# Patient Record
Sex: Female | Born: 1965 | Race: Black or African American | Hispanic: No | Marital: Married | Smoking: Current every day smoker
Health system: Southern US, Community
[De-identification: ages and names within clinical notes are randomized; demographics above are authoritative.]

## PROBLEM LIST (undated history)

## (undated) DIAGNOSIS — M545 Low back pain, unspecified: Secondary | ICD-10-CM

## (undated) DIAGNOSIS — F32A Depression, unspecified: Secondary | ICD-10-CM

## (undated) DIAGNOSIS — K219 Gastro-esophageal reflux disease without esophagitis: Secondary | ICD-10-CM

## (undated) DIAGNOSIS — M461 Sacroiliitis, not elsewhere classified: Secondary | ICD-10-CM

## (undated) DIAGNOSIS — E785 Hyperlipidemia, unspecified: Secondary | ICD-10-CM

## (undated) DIAGNOSIS — G5600 Carpal tunnel syndrome, unspecified upper limb: Secondary | ICD-10-CM

## (undated) DIAGNOSIS — F329 Major depressive disorder, single episode, unspecified: Secondary | ICD-10-CM

## (undated) DIAGNOSIS — J302 Other seasonal allergic rhinitis: Secondary | ICD-10-CM

## (undated) DIAGNOSIS — M5430 Sciatica, unspecified side: Secondary | ICD-10-CM

## (undated) DIAGNOSIS — T7840XA Allergy, unspecified, initial encounter: Secondary | ICD-10-CM

## (undated) DIAGNOSIS — K5792 Diverticulitis of intestine, part unspecified, without perforation or abscess without bleeding: Secondary | ICD-10-CM

## (undated) DIAGNOSIS — J449 Chronic obstructive pulmonary disease, unspecified: Secondary | ICD-10-CM

## (undated) DIAGNOSIS — M542 Cervicalgia: Secondary | ICD-10-CM

## (undated) HISTORY — PX: OTHER SURGICAL HISTORY: SHX169

## (undated) HISTORY — PX: BREAST EXCISIONAL BIOPSY: SUR124

## (undated) HISTORY — DX: Chronic obstructive pulmonary disease, unspecified: J44.9

## (undated) HISTORY — PX: PARTIAL HYSTERECTOMY: SHX80

## (undated) HISTORY — PX: ANKLE SURGERY: SHX546

## (undated) HISTORY — DX: Carpal tunnel syndrome, unspecified upper limb: G56.00

## (undated) HISTORY — DX: Major depressive disorder, single episode, unspecified: F32.9

## (undated) HISTORY — DX: Hyperlipidemia, unspecified: E78.5

## (undated) HISTORY — PX: URETHRAL DIVERTICULUM REPAIR: SHX5148

## (undated) HISTORY — PX: ABDOMINAL SURGERY: SHX537

## (undated) HISTORY — DX: Cervicalgia: M54.2

## (undated) HISTORY — DX: Gastro-esophageal reflux disease without esophagitis: K21.9

## (undated) HISTORY — DX: Allergy, unspecified, initial encounter: T78.40XA

## (undated) HISTORY — DX: Depression, unspecified: F32.A

---

## 2007-03-01 ENCOUNTER — Inpatient Hospital Stay (HOSPITAL_COMMUNITY): Admission: AD | Admit: 2007-03-01 | Discharge: 2007-03-01 | Payer: Self-pay | Admitting: Gynecology

## 2007-10-15 ENCOUNTER — Encounter: Admission: RE | Admit: 2007-10-15 | Discharge: 2007-10-15 | Payer: Self-pay | Admitting: Internal Medicine

## 2009-09-10 ENCOUNTER — Emergency Department (HOSPITAL_COMMUNITY)
Admission: EM | Admit: 2009-09-10 | Discharge: 2009-09-10 | Payer: Self-pay | Source: Home / Self Care | Admitting: Emergency Medicine

## 2009-12-18 ENCOUNTER — Emergency Department (HOSPITAL_COMMUNITY)
Admission: EM | Admit: 2009-12-18 | Discharge: 2009-12-18 | Payer: Self-pay | Source: Home / Self Care | Admitting: Family Medicine

## 2010-04-03 LAB — URINALYSIS, ROUTINE W REFLEX MICROSCOPIC
Bilirubin Urine: NEGATIVE
Hgb urine dipstick: NEGATIVE
Nitrite: NEGATIVE
Protein, ur: NEGATIVE mg/dL
Urobilinogen, UA: 0.2 mg/dL (ref 0.0–1.0)

## 2010-04-03 LAB — URINE MICROSCOPIC-ADD ON

## 2010-04-03 LAB — POCT PREGNANCY, URINE: Preg Test, Ur: NEGATIVE

## 2010-09-25 ENCOUNTER — Inpatient Hospital Stay (INDEPENDENT_AMBULATORY_CARE_PROVIDER_SITE_OTHER)
Admission: RE | Admit: 2010-09-25 | Discharge: 2010-09-25 | Disposition: A | Discharge status: Home / Self Care | Payer: Self-pay | Source: Ambulatory Visit | Attending: Family Medicine | Admitting: Family Medicine

## 2010-09-25 DIAGNOSIS — M62838 Other muscle spasm: Secondary | ICD-10-CM

## 2010-09-25 DIAGNOSIS — M461 Sacroiliitis, not elsewhere classified: Secondary | ICD-10-CM

## 2010-10-09 LAB — URINALYSIS, ROUTINE W REFLEX MICROSCOPIC
Leukocytes, UA: NEGATIVE
Nitrite: NEGATIVE
Specific Gravity, Urine: >1.03 — ABNORMAL HIGH
Urobilinogen, UA: 0.2

## 2010-10-09 LAB — URINE MICROSCOPIC-ADD ON

## 2010-10-09 LAB — CBC
HCT: 43.6
Hemoglobin: 15
MCV: 95.3
Platelets: 241
RDW: 14.5
WBC: 10.2

## 2010-10-09 LAB — POCT PREGNANCY, URINE: Preg Test, Ur: NEGATIVE

## 2010-10-09 LAB — URINE CULTURE

## 2011-01-21 ENCOUNTER — Emergency Department (HOSPITAL_COMMUNITY)
Admission: EM | Admit: 2011-01-21 | Discharge: 2011-01-21 | Disposition: A | Discharge status: Home / Self Care | Payer: Self-pay | Attending: Emergency Medicine | Admitting: Emergency Medicine

## 2011-01-21 DIAGNOSIS — J069 Acute upper respiratory infection, unspecified: Secondary | ICD-10-CM | POA: Insufficient documentation

## 2011-01-21 DIAGNOSIS — J3489 Other specified disorders of nose and nasal sinuses: Secondary | ICD-10-CM | POA: Insufficient documentation

## 2011-01-21 DIAGNOSIS — F17200 Nicotine dependence, unspecified, uncomplicated: Secondary | ICD-10-CM | POA: Insufficient documentation

## 2011-01-21 DIAGNOSIS — R05 Cough: Secondary | ICD-10-CM | POA: Insufficient documentation

## 2011-01-21 DIAGNOSIS — R059 Cough, unspecified: Secondary | ICD-10-CM | POA: Insufficient documentation

## 2011-01-21 DIAGNOSIS — J029 Acute pharyngitis, unspecified: Secondary | ICD-10-CM | POA: Insufficient documentation

## 2011-01-21 NOTE — ED Notes (Signed)
Pt. Has a cough and reports that her lymph nodes are painful in her neck.

## 2011-01-21 NOTE — ED Notes (Signed)
Vital signs stable. 

## 2011-01-21 NOTE — ED Notes (Signed)
Patient denies pain and is resting comfortably.  

## 2011-01-21 NOTE — ED Provider Notes (Signed)
History     CSN: [id number]  Arrival date & time 01/21/11  1133   First MD Initiated Contact with Patient 01/21/11 1228      Chief Complaint  Patient presents with  . URI    (Consider location/radiation/quality/duration/timing/severity/associated sxs/prior treatment) HPI Patient presents with complaint of sore throat. She states that she has had sore throat for 4-5 days. No difficulty swallowing or breathing. She does complain of tender lymph nodes in her anterior neck. Pain is symmetric. She has had mild cough which is nonproductive. She has also had some rhinorrhea. She's had no fever or chills. She's been drinking fluids well and has tried Robitussin and Mucinex for her symptoms without relief. There no other alleviating or modifying factors. There no associated systemic symptoms.  History reviewed. No pertinent past medical history.  Past Surgical History  Procedure Date  . Ankle surgery     lt.  Marland Kitchen Uretherl     uretrral diverticultis  . Abdominal hysterectomy     History reviewed. No pertinent family history.  History  Substance Use Topics  . Smoking status: Current Everyday Smoker  . Smokeless tobacco: Never Used  . Alcohol Use: No    OB History    Grav Para Term Preterm Abortions TAB SAB Ect Mult Living                  Review of Systems ROS reviewed and otherwise negative except for mentioned in HPI  Allergies  Penicillins  Home Medications   Current Outpatient Rx  Name Route Sig Dispense Refill  . ALLERGY DECONGESTANT PO Oral Take 1 tablet by mouth daily.      . IBUPROFEN 200 MG PO TABS Oral Take 200 mg by mouth every 6 (six) hours as needed. For pain with scar tissue     . ACIDOPHILUS PO Oral Take 1 tablet by mouth daily.      . ADULT MULTIVITAMIN W/MINERALS CH Oral Take 1 tablet by mouth daily.      Marland Kitchen VITAMIN B-6 PO Oral Take 1 tablet by mouth daily.        BP 107/65  Pulse 59  Temp(Src) 97.7 F (36.5 C) (Oral)  Resp 16  SpO2 100% Vitals  reviewed Physical Exam Physical Examination: General appearance - alert, well appearing, and in no distress Mental status - alert, oriented to person, place, and time Eyes - pupils equal and reactive, extraocular eye movements intact Mouth - MMM, OP with mild erythema, no exudate, palate symmetric, uvula midline Neck - supple, no significant adenopathy, shotty anterior cervical LAD Chest - clear to auscultation, no wheezes, rales or rhonchi, symmetric air entry Heart - normal rate, regular rhythm, normal S1, S2, no murmurs, rubs, clicks or gallops Musculoskeletal - no joint tenderness, deformity or swelling Extremities - peripheral pulses normal, no pedal edema, no clubbing or cyanosis Skin - normal coloration and turgor, no rashes  ED Course  Procedures (including critical care time)  Rapid strep negative   Labs Reviewed  RAPID STREP SCREEN   No results found.   1. Pharyngitis   2. Upper respiratory infection       MDM  Patient presenting with sore throat runny nose and tender swollen glands in her neck. She is nontoxic and well-hydrated appearing. Her rapid strep testing was negative in the ED her symptoms likely represent a viral upper respiratory infection. She is to continue symptomatic treatment home and we have discussed the importance of hydration. She was discharged with strict return  precautions and is agreeable with the plan for discharge.        Ethelda Chick, MD 01/21/11 1330

## 2011-06-17 ENCOUNTER — Emergency Department (INDEPENDENT_AMBULATORY_CARE_PROVIDER_SITE_OTHER)
Admission: EM | Admit: 2011-06-17 | Discharge: 2011-06-17 | Disposition: A | Discharge status: Home / Self Care | Payer: Self-pay | Source: Home / Self Care | Attending: Emergency Medicine | Admitting: Emergency Medicine

## 2011-06-17 ENCOUNTER — Encounter (HOSPITAL_COMMUNITY): Payer: Self-pay | Admitting: Emergency Medicine

## 2011-06-17 DIAGNOSIS — M461 Sacroiliitis, not elsewhere classified: Secondary | ICD-10-CM

## 2011-06-17 HISTORY — DX: Other seasonal allergic rhinitis: J30.2

## 2011-06-17 HISTORY — DX: Low back pain: M54.5

## 2011-06-17 HISTORY — DX: Sacroiliitis, not elsewhere classified: M46.1

## 2011-06-17 HISTORY — DX: Low back pain, unspecified: M54.50

## 2011-06-17 HISTORY — DX: Diverticulitis of intestine, part unspecified, without perforation or abscess without bleeding: K57.92

## 2011-06-17 MED ORDER — PREDNISONE 20 MG PO TABS: ORAL_TABLET | ORAL | Status: AC

## 2011-06-17 MED ORDER — MELOXICAM 15 MG PO TABS: 15.0000 mg | ORAL_TABLET | Freq: Every day | ORAL | Status: DC

## 2011-06-17 MED ORDER — HYDROCODONE-ACETAMINOPHEN 5-325 MG PO TABS: 2.0000 | ORAL_TABLET | ORAL | Status: AC | PRN

## 2011-06-17 NOTE — Discharge Instructions (Signed)
Take the medication as written. Take 1 gram of tylenol  up to 4 times a day as needed for pain and fever. This with the meloxicam is an effective combination for pain. Take the hydrocodone/norco only for severe pain. Do not take the tylenol and hydrocodone/norco as they both have tylenol in them and too much can hurt your liver. Do not exceed 4 grams of tylenol a day from all sources. Return if you get worse, have a  fever >100.4, or for any concerns.   Go to www.goodrx.com to look up your medications. This will give you a list of where you can find your prescriptions at the most affordable prices.  Go to www.goodrx.com to look up your medications. This will give you a list of where you can find your prescriptions at the most affordable prices.   Call [facility]  [phone]  If you have no primary doctor, here are some resources that may be helpful:  Medicaid-accepting [facility] Providers:   - [facility]- [address]      [phone]      Mon-Fri 9am-7pm, Sat 9am-1pm   - [facility]- [address] [place], [place]      [phone]   - [facility]- [address], [address] [place]      [phone]   [facility]- [address]      [phone]   - Renaye Rakers- [address] [place], [address]      [phone]      Only accepts [place] patients       after they have her name applied to their card   Self Pay (no insurance) in [place]:   - Sickle Cell Patients: Dr Willey Blade, [facility]      [address] [place]      [phone]   - [phone]   - Physician Referral Service- [phone]   - [facility]- [address] [place]      [phone]   [facility] [place]- [address]   - [facility]- see information above      (Speak to [employer] if you do not have insurance)   - [facility]- [address] [place]      [phone]   - [facility] [place]- [address]      [phone]   - [facility]- [address]      [phone]   - Dr Julio Sicks-  [address], [place]      [phone]   - [facility]- [address]      [phone]   - [facility]- [address]      [phone]      Also [address]      [phone]   - [facility]- [address]      [phone]      1st and 3rd Saturday every month, 10am-1pm    Other agencies that provide inexpensive medical care:     [facility]  [phone]    [facility]  [phone]    [facility]  [phone]    [facility]  [phone] [address] [place] [address]    [facility]  [phone]    [facility]  [phone] [facility] [phone]   [address] [place], [place] [address]  Chronic Pain Problems Contact [facility]  [phone] Patients need to be referred by their primary care doctor.  [facility]  [facility]     [employer]                          [facility] [address] [place]                       [address]      [address] [place] [address]   [phone] (After Hours)  General Information: Finding a doctor when you do not have health insurance can be tricky. Although you are not limited by an insurance plan, you are of course limited by her finances and how much but he can pay out of pocket.  What are your options if you don't have health insurance?   1) Find a Librarian, academic and Pay Out of Pocket Although you won't have to find out who is covered by your insurance plan, it is a good idea to ask around and get recommendations. You will then need to call the office and see if the doctor you have chosen will accept you as a new patient and what types of options they  offer for patients who are self-pay. Some doctors offer discounts or will set up payment plans for their patients who do not have insurance, but you will need to ask so you aren't surprised when you get to your appointment.  2) Contact Your Local Health Department Not all health departments have doctors that can see patients for sick visits, but many do, so it is worth a call to see if yours does. If you don't know where your local health department is, you can check in your phone book. The CDC also has a tool to help you locate your state's health department, and many state websites also have listings of all of their local health departments.  3) Find a Walk-in Clinic If your illness is not likely to be very severe or complicated, you may want to try a walk in clinic. These are popping up all over the country in pharmacies, drugstores, and shopping centers. They're usually staffed by nurse practitioners or physician assistants that have been trained to treat common illnesses and complaints. They're usually fairly quick and inexpensive. However, if you have serious medical issues or chronic medical problems, these are probably not your best option

## 2011-06-17 NOTE — ED Notes (Signed)
Pt reports lower back pain with the pain moving into her right buttock that started 1 week ago. Pt initially treated for this same type of pain last Aug/Sept after squatting and bending. No loss of bowel or bladder control.

## 2011-06-17 NOTE — ED Provider Notes (Signed)
History     CSN: [id number]  Arrival date & time 06/17/11  0802   First MD Initiated Contact with Patient 06/17/11 [phone]      Chief Complaint  Patient presents with  . Back Pain    (Consider location/radiation/quality/duration/timing/severity/associated sxs/prior treatment) HPI Comments: Patient with dull, achy, right lower back pain starting about a week ago. It is worse with lifting heavy objects and standing for prolonged periods of time. States she does a lot of lifting and bending at work, but does not recall any specific incident that exacerbated her symptoms. No history of injury to his back. No other change in physical activity. No alleviating factors. She's been taking ibuprofen, using an unknown over-the-counter topical pain patch without relief. No urinary complaints, vaginal discharge, vaginal bleeding. Similar symptoms before last year, was thought to have sacroiliitis.  ROS as noted in HPI. All other ROS negative.   Patient is a 46 y.o. female presenting with back pain. The history is provided by the patient. No language interpreter was used.  Back Pain  This is a recurrent problem. The current episode started more than 2 days ago. The problem occurs constantly. The problem has not changed since onset.The pain is associated with lifting heavy objects. The pain is present in the sacro-iliac joint. The quality of the pain is described as aching. Radiates to: to right buttock. The symptoms are aggravated by certain positions (standing for prolonged periods of time). The pain is worse during the day. Pertinent negatives include no chest pain, no fever, no numbness, no abdominal pain, no abdominal swelling, no bowel incontinence, no perianal numbness, no bladder incontinence, no dysuria, no pelvic pain, no leg pain, no paresthesias, no paresis, no tingling and no weakness. She has tried NSAIDs for the symptoms. The treatment provided no relief.    Past Medical History  Diagnosis  Date  . Low back pain   . Sacroiliac inflammation   . Seasonal allergies   . Diverticulitis     Past Surgical History  Procedure Date  . Ankle surgery     lt.  Marland Kitchen Uretherl     uretrral diverticultis  . Abdominal hysterectomy   . Abdominal surgery     History reviewed. No pertinent family history.  History  Substance Use Topics  . Smoking status: Current Everyday Smoker -- 0.5 packs/day  . Smokeless tobacco: Never Used  . Alcohol Use: No    OB History    Grav Para Term Preterm Abortions TAB SAB Ect Mult Living                  Review of Systems  Constitutional: Negative for fever.  Cardiovascular: Negative for chest pain.  Gastrointestinal: Negative for abdominal pain and bowel incontinence.  Genitourinary: Negative for bladder incontinence, dysuria and pelvic pain.  Musculoskeletal: Positive for back pain.  Neurological: Negative for tingling, weakness, numbness and paresthesias.    Allergies  Penicillins  Home Medications   Current Outpatient Rx  Name Route Sig Dispense Refill  . ACIDOPHILUS PO Oral Take 1 tablet by mouth daily.      . ADULT MULTIVITAMIN W/MINERALS CH Oral Take 1 tablet by mouth daily.      Marland Kitchen VITAMIN B-6 PO Oral Take 1 tablet by mouth daily.      Marland Kitchen HYDROCODONE-ACETAMINOPHEN 5-325 MG PO TABS Oral Take 2 tablets by mouth every 4 (four) hours as needed for pain. 20 tablet 0  . MELOXICAM 15 MG PO TABS Oral Take 1 tablet (15  mg total) by mouth daily. 14 tablet 0  . PREDNISONE 20 MG PO TABS  Take 3 tabs po on first day, 2 tabs second day, 2 tabs third day, 1 tab fourth day, 1 tab 5th day. Take with food. 9 tablet 0    BP 99/59  Pulse 70  Temp(Src) 98.3 F (36.8 C) (Oral)  Resp 18  SpO2 98%  Physical Exam  Nursing note and vitals reviewed. Constitutional: She is oriented to person, place, and time. She appears well-developed and well-nourished. No distress.  HENT:  Head: Normocephalic and atraumatic.  Eyes: Conjunctivae and EOM are normal.   Neck: Normal range of motion.  Cardiovascular: Normal rate.   Pulmonary/Chest: Effort normal.  Abdominal: She exhibits no distension.  Musculoskeletal: Normal range of motion.       Back:       Bilateral lower extremities nontender  baseline ROM with intact DP pulses. No pain with PROM hips bilaterally. SLR neg bilaterally. Sensation baseline light touch bilaterally for Pt, DTR's symmetric and intact bilaterally KJ, Motor symmetric bilateral 5/5 hip flexion, quadriceps, hamstrings, EHL, foot dorsiflexion, foot plantarflexion, gait normal  Neurological: She is alert and oriented to person, place, and time.  Skin: Skin is warm and dry.  Psychiatric: She has a normal mood and affect. Her behavior is normal. Judgment and thought content normal.    ED Course  Procedures (including critical care time)  Labs Reviewed - No data to display No results found.   1. Sacroiliac inflammation      MDM  Previous chart, labs, imaging reviewed. Seen 09/25/2010 for LBP thought to have sacroilitis  No evidence of uti, nephrolithiasis. No evidence of spinal cord involvement based on H&P. Pt describing typical back pain, has been <6 week duration. No red flags such as fevers, age >12, h/o trauma with bony tenderness, neurological deficits, bladder/ bowel incontinence, h/o CA, unexplained weight loss, pain worse at night,  h/o prolonged steroid use, h/o osteopenia, h/o IVDU. Imaging not indicated at this time. Home with NSAIDs, prednisone, short course of Norco.  Luiz Blare, MD 06/17/11 [phone]

## 2011-11-03 ENCOUNTER — Emergency Department (INDEPENDENT_AMBULATORY_CARE_PROVIDER_SITE_OTHER)
Admission: EM | Admit: 2011-11-03 | Discharge: 2011-11-03 | Disposition: A | Discharge status: Home / Self Care | Payer: Self-pay | Source: Home / Self Care

## 2011-11-03 ENCOUNTER — Encounter (HOSPITAL_COMMUNITY): Payer: Self-pay

## 2011-11-03 DIAGNOSIS — M5430 Sciatica, unspecified side: Secondary | ICD-10-CM

## 2011-11-03 DIAGNOSIS — G8929 Other chronic pain: Secondary | ICD-10-CM

## 2011-11-03 DIAGNOSIS — M549 Dorsalgia, unspecified: Secondary | ICD-10-CM

## 2011-11-03 DIAGNOSIS — M5431 Sciatica, right side: Secondary | ICD-10-CM

## 2011-11-03 MED ORDER — KETOROLAC TROMETHAMINE 30 MG/ML IJ SOLN
INTRAMUSCULAR | Status: AC
  Filled 2011-11-03: qty 1

## 2011-11-03 MED ORDER — KETOROLAC TROMETHAMINE 30 MG/ML IJ SOLN
30.0000 mg | Freq: Once | INTRAMUSCULAR | Status: AC
  Administered 2011-11-03: 30 mg via INTRAMUSCULAR

## 2011-11-03 MED ORDER — METHYLPREDNISOLONE 4 MG PO KIT: PACK | ORAL | Status: DC

## 2011-11-03 MED ORDER — HYDROCODONE-ACETAMINOPHEN 5-325 MG PO TABS: 1.0000 | ORAL_TABLET | ORAL | Status: DC | PRN

## 2011-11-03 NOTE — ED Notes (Signed)
States she has had pain in her back off and on for some time, most recently for past 2 weeks w pain in right buttocks and into knee; denies pain in lower leg/foot, denies saddle anesthesia or loss bowel/bladder control

## 2011-11-03 NOTE — ED Provider Notes (Signed)
History     CSN: [id number]  Arrival date & time 11/03/11  0900   None     No chief complaint on file.   (Consider location/radiation/quality/duration/timing/severity/associated sxs/prior treatment) HPI Comments: 46 year old female with chronic low back pain for several years. She comes today with exacerbation of pain that began about 2 months ago pain is located in the lower mid back radiates to the right paralumbar musculature in the right mid buttock and posterior right thigh. Denies any known trauma or single he been precipitated the exacerbation. The pain is made worse with ambulation bending or any other type of movement. This is the same type pain that she's had episodically over the past several years.   Past Medical History  Diagnosis Date  . Low back pain   . Sacroiliac inflammation   . Seasonal allergies   . Diverticulitis     Past Surgical History  Procedure Date  . Ankle surgery     lt.  Marland Kitchen Uretherl     uretrral diverticultis  . Abdominal hysterectomy   . Abdominal surgery     No family history on file.  History  Substance Use Topics  . Smoking status: Current Every Day Smoker -- 0.5 packs/day  . Smokeless tobacco: Never Used  . Alcohol Use: No    OB History    Grav Para Term Preterm Abortions TAB SAB Ect Mult Living                  Review of Systems  Constitutional: Negative for fever, chills and activity change.  HENT: Negative.   Respiratory: Negative.   Cardiovascular: Negative.   Musculoskeletal: Positive for back pain.       As per HPI  Skin: Negative for color change, pallor and rash.  Neurological: Negative.     Allergies  Penicillins  Home Medications   Current Outpatient Rx  Name Route Sig Dispense Refill  . HYDROCODONE-ACETAMINOPHEN 5-325 MG PO TABS Oral Take 1 tablet by mouth every 4 (four) hours as needed for pain. 8 tablet 0  . ACIDOPHILUS PO Oral Take 1 tablet by mouth daily.      . MELOXICAM 15 MG PO TABS Oral Take 1  tablet (15 mg total) by mouth daily. 14 tablet 0  . METHYLPREDNISOLONE 4 MG PO KIT  As directed 21 tablet 0  . ADULT MULTIVITAMIN W/MINERALS CH Oral Take 1 tablet by mouth daily.      Marland Kitchen VITAMIN B-6 PO Oral Take 1 tablet by mouth daily.        BP 122/77  Pulse 71  Temp 98.6 F (37 C) (Oral)  Resp 16  SpO2 96%  Physical Exam  Constitutional: She is oriented to person, place, and time. She appears well-developed and well-nourished. No distress.  HENT:  Head: Normocephalic and atraumatic.  Eyes: EOM are normal. Pupils are equal, round, and reactive to light.  Neck: Normal range of motion. Neck supple.  Musculoskeletal:       Minor tenderness in the right buttock. No spinal tenderness or right paralumbar musculature tenderness no tenderness in the posterior thigh. Pain is elicited with specific movements such as flexion at the waist during the exam. Negative straight leg raises.  Lymphadenopathy:    She has no cervical adenopathy.  Neurological: She is alert and oriented to person, place, and time. She has normal reflexes. No cranial nerve deficit. She exhibits normal muscle tone. Coordination normal.  Skin: Skin is warm and dry. No erythema.  ED Course  Procedures (including critical care time)  Labs Reviewed - No data to display No results found.   1. Chronic back pain greater than 3 months duration   2. Sciatica of right side       MDM  Medrol Dosepak as directed Apply heat locally Norco 5 one every 6 hours when necessary pain #8 Explained to her that obtain a PCP would be best for her to try to find the source of the pain and eliminate the daily chronic pain issues having.        Hayden Rasmussen, NP 11/03/11 1048

## 2011-11-04 NOTE — ED Provider Notes (Signed)
Medical screening examination/treatment/procedure(s) were performed by non-physician practitioner and as supervising physician I was immediately available for consultation/collaboration.  Bernetta Sutley, M.D.   Evann Koelzer C Clotilda Hafer, MD 11/04/11 0808 

## 2011-11-10 ENCOUNTER — Emergency Department (HOSPITAL_COMMUNITY)
Admission: EM | Admit: 2011-11-10 | Discharge: 2011-11-10 | Disposition: A | Discharge status: Home / Self Care | Payer: Self-pay | Attending: Emergency Medicine | Admitting: Emergency Medicine

## 2011-11-10 ENCOUNTER — Encounter (HOSPITAL_COMMUNITY): Payer: Self-pay

## 2011-11-10 DIAGNOSIS — M545 Low back pain, unspecified: Secondary | ICD-10-CM | POA: Insufficient documentation

## 2011-11-10 DIAGNOSIS — M79609 Pain in unspecified limb: Secondary | ICD-10-CM | POA: Insufficient documentation

## 2011-11-10 DIAGNOSIS — Z8709 Personal history of other diseases of the respiratory system: Secondary | ICD-10-CM | POA: Insufficient documentation

## 2011-11-10 DIAGNOSIS — M549 Dorsalgia, unspecified: Secondary | ICD-10-CM

## 2011-11-10 DIAGNOSIS — Z8719 Personal history of other diseases of the digestive system: Secondary | ICD-10-CM | POA: Insufficient documentation

## 2011-11-10 DIAGNOSIS — Z8739 Personal history of other diseases of the musculoskeletal system and connective tissue: Secondary | ICD-10-CM | POA: Insufficient documentation

## 2011-11-10 DIAGNOSIS — F17200 Nicotine dependence, unspecified, uncomplicated: Secondary | ICD-10-CM | POA: Insufficient documentation

## 2011-11-10 LAB — COMPREHENSIVE METABOLIC PANEL
ALT: 14 U/L (ref 0–35)
AST: 17 U/L (ref 0–37)
Albumin: 3.5 g/dL (ref 3.5–5.2)
Alkaline Phosphatase: 63 U/L (ref 39–117)
BUN: 17 mg/dL (ref 6–23)
CO2: 30 mEq/L (ref 19–32)
Calcium: 9.2 mg/dL (ref 8.4–10.5)
Chloride: 100 mEq/L (ref 96–112)
Creatinine, Ser: 0.68 mg/dL (ref 0.50–1.10)
GFR calc Af Amer: >90 mL/min (ref >90)
GFR calc non Af Amer: >90 mL/min (ref >90)
Glucose, Bld: 69 mg/dL — ABNORMAL LOW (ref 70–99)
Potassium: 3.7 mEq/L (ref 3.5–5.1)
Sodium: 138 mEq/L (ref 135–145)
Total Bilirubin: 0.3 mg/dL (ref 0.3–1.2)
Total Protein: 7.1 g/dL (ref 6.0–8.3)

## 2011-11-10 LAB — CBC WITH DIFFERENTIAL/PLATELET
Basophils Absolute: 0 10*3/uL (ref 0.0–0.1)
Basophils Relative: 0 % (ref 0–1)
Eosinophils Absolute: 0.3 10*3/uL (ref 0.0–0.7)
Eosinophils Relative: 2 % (ref 0–5)
HCT: 44.9 % (ref 36.0–46.0)
Hemoglobin: 15.8 g/dL — ABNORMAL HIGH (ref 12.0–15.0)
Lymphocytes Relative: 34 % (ref 12–46)
Lymphs Abs: 4.5 10*3/uL — ABNORMAL HIGH (ref 0.7–4.0)
MCH: 33.2 pg (ref 26.0–34.0)
MCHC: 35.2 g/dL (ref 30.0–36.0)
MCV: 94.3 fL (ref 78.0–100.0)
Monocytes Absolute: 1.3 10*3/uL — ABNORMAL HIGH (ref 0.1–1.0)
Monocytes Relative: 10 % (ref 3–12)
Neutro Abs: 7.1 10*3/uL (ref 1.7–7.7)
Neutrophils Relative %: 54 % (ref 43–77)
Platelets: 223 10*3/uL (ref 150–400)
RBC: 4.76 MIL/uL (ref 3.87–5.11)
RDW: 15.4 % (ref 11.5–15.5)
WBC: 13.2 10*3/uL — ABNORMAL HIGH (ref 4.0–10.5)

## 2011-11-10 LAB — GLUCOSE, CAPILLARY: Glucose-Capillary: 79 mg/dL (ref 70–99)

## 2011-11-10 MED ORDER — METHYLPREDNISOLONE 4 MG PO KIT: PACK | ORAL | Status: DC

## 2011-11-10 MED ORDER — NAPROXEN 500 MG PO TABS: 500.0000 mg | ORAL_TABLET | Freq: Two times a day (BID) | ORAL | Status: DC | PRN

## 2011-11-10 MED ORDER — OXYCODONE-ACETAMINOPHEN 5-325 MG PO TABS: 1.0000 | ORAL_TABLET | ORAL | Status: DC | PRN

## 2011-11-10 NOTE — ED Notes (Signed)
Denies any injuries

## 2011-11-10 NOTE — ED Notes (Signed)
CBG measured at 79

## 2011-11-10 NOTE — ED Notes (Signed)
Pt complains of back pain, and leg pain and dizziness and lightheaded.

## 2011-11-10 NOTE — ED Provider Notes (Signed)
2:51 PM  Date: 11/10/2011  Rate:84  Rhythm: normal sinus rhythm  QRS Axis: right  Intervals: PR shortened  ST/T Wave abnormalities: normal  Conduction Disutrbances:none  Narrative Interpretation: Borderline EKG  Old EKG Reviewed: none available    Carleene Cooper III, MD 11/10/11 1453

## 2011-12-03 NOTE — ED Provider Notes (Signed)
History    46 year old female with back pain. Lower back. Patient states that she's had this pain intermittently for years, worse within the past 2 weeks. Denies acute trauma. Pain radiates into her right buttock down her posterior thigh to the area the knee. Pain is worse with movement. No numbness, tingling or loss of strength. No urinary complaints. No incontinence or retention. No use of blood thinning medication. No prior history of back surgery.  CSN: [id number]  Arrival date & time 11/10/11  1306   First MD Initiated Contact with Patient 11/10/11 1502      Chief Complaint  Patient presents with  . Back Pain  . Leg Pain    (Consider location/radiation/quality/duration/timing/severity/associated sxs/prior treatment) HPI  Past Medical History  Diagnosis Date  . Low back pain   . Sacroiliac inflammation   . Seasonal allergies   . Diverticulitis     Past Surgical History  Procedure Date  . Ankle surgery     lt.  Marland Kitchen Uretherl     uretrral diverticultis  . Abdominal hysterectomy   . Abdominal surgery     No family history on file.  History  Substance Use Topics  . Smoking status: Current Every Day Smoker -- 0.5 packs/day  . Smokeless tobacco: Never Used  . Alcohol Use: No    OB History    Grav Para Term Preterm Abortions TAB SAB Ect Mult Living                  Review of Systems   Review of symptoms negative unless otherwise noted in HPI.   Allergies  Penicillins  Home Medications   Current Outpatient Rx  Name  Route  Sig  Dispense  Refill  . IBUPROFEN 200 MG PO TABS   Oral   Take 600 mg by mouth 2 (two) times daily as needed. For pain         . ACIDOPHILUS PO   Oral   Take 1 tablet by mouth daily.           . ADULT MULTIVITAMIN W/MINERALS CH   Oral   Take 1 tablet by mouth daily.           Marland Kitchen OMEPRAZOLE PO   Oral   Take 1 capsule by mouth every morning.         Marland Kitchen OVER THE COUNTER MEDICATION   Oral   Take 2 tablets by mouth  daily. biosil         . VITAMIN B-6 PO   Oral   Take 1 tablet by mouth daily.           . METHYLPREDNISOLONE 4 MG PO KIT      1 dose pack as directed.   21 tablet   0   . NAPROXEN 500 MG PO TABS   Oral   Take 1 tablet (500 mg total) by mouth 2 (two) times daily as needed.   20 tablet   0   . OXYCODONE-ACETAMINOPHEN 5-325 MG PO TABS   Oral   Take 1 tablet by mouth every 4 (four) hours as needed for pain.   12 tablet   0     BP 105/66  Pulse 61  Temp 98.2 F (36.8 C) (Oral)  Resp 20  SpO2 100%  Physical Exam  Nursing note and vitals reviewed. Constitutional: She appears well-developed and well-nourished. No distress.  HENT:  Head: Normocephalic and atraumatic.  Eyes: Conjunctivae normal are normal. Right eye exhibits no discharge.  Left eye exhibits no discharge.  Neck: Neck supple.  Cardiovascular: Normal rate, regular rhythm and normal heart sounds.  Exam reveals no gallop and no friction rub.   No murmur heard. Pulmonary/Chest: Effort normal and breath sounds normal. No respiratory distress.  Abdominal: Soft. She exhibits no distension. There is no tenderness.  Musculoskeletal: She exhibits no edema and no tenderness.       Arms:      Legs:      Tenderness in depicted areas. NVI distally. No concerning overlying skin changes.  Neurological: She is alert.  Skin: Skin is warm and dry.  Psychiatric: She has a normal mood and affect. Her behavior is normal. Thought content normal.    ED Course  Procedures (including critical care time)  Labs Reviewed  CBC WITH DIFFERENTIAL - Abnormal; Notable for the following:    WBC 13.2 (*)     Hemoglobin 15.8 (*)     Lymphs Abs 4.5 (*)     Monocytes Absolute 1.3 (*)     All other components within normal limits  COMPREHENSIVE METABOLIC PANEL - Abnormal; Notable for the following:    Glucose, Bld 69 (*)     All other components within normal limits  GLUCOSE, CAPILLARY  LAB REPORT - SCANNED   No results  found.   1. Back pain       MDM  46 year old female with back pain. Chronic in nature. No concerning "red flags." Plan symptomatic treatment. Return cautions were discussed. Outpatient followup otherwise the        Raeford Razor, MD 12/03/11 [phone]

## 2012-01-25 ENCOUNTER — Emergency Department (HOSPITAL_COMMUNITY)
Admission: EM | Admit: 2012-01-25 | Discharge: 2012-01-25 | Disposition: A | Discharge status: Home / Self Care | Payer: Self-pay | Attending: Emergency Medicine | Admitting: Emergency Medicine

## 2012-01-25 ENCOUNTER — Encounter (HOSPITAL_COMMUNITY): Payer: Self-pay | Admitting: *Deleted

## 2012-01-25 DIAGNOSIS — Z79899 Other long term (current) drug therapy: Secondary | ICD-10-CM | POA: Insufficient documentation

## 2012-01-25 DIAGNOSIS — F17200 Nicotine dependence, unspecified, uncomplicated: Secondary | ICD-10-CM | POA: Insufficient documentation

## 2012-01-25 DIAGNOSIS — G8929 Other chronic pain: Secondary | ICD-10-CM | POA: Insufficient documentation

## 2012-01-25 DIAGNOSIS — Z8719 Personal history of other diseases of the digestive system: Secondary | ICD-10-CM | POA: Insufficient documentation

## 2012-01-25 DIAGNOSIS — Z8739 Personal history of other diseases of the musculoskeletal system and connective tissue: Secondary | ICD-10-CM | POA: Insufficient documentation

## 2012-01-25 DIAGNOSIS — M5430 Sciatica, unspecified side: Secondary | ICD-10-CM | POA: Insufficient documentation

## 2012-01-25 DIAGNOSIS — M542 Cervicalgia: Secondary | ICD-10-CM | POA: Insufficient documentation

## 2012-01-25 DIAGNOSIS — R350 Frequency of micturition: Secondary | ICD-10-CM | POA: Insufficient documentation

## 2012-01-25 MED ORDER — HYDROCODONE-ACETAMINOPHEN 5-325 MG PO TABS: 1.0000 | ORAL_TABLET | Freq: Four times a day (QID) | ORAL | Status: DC | PRN

## 2012-01-25 MED ORDER — NAPROXEN 500 MG PO TABS: 500.0000 mg | ORAL_TABLET | Freq: Two times a day (BID) | ORAL | Status: DC

## 2012-01-25 MED ORDER — PREDNISONE (PAK) 10 MG PO TABS: ORAL_TABLET | ORAL | Status: DC

## 2012-01-25 NOTE — ED Notes (Signed)
Pt states she is out of percocet, naproxen, and methylprednisolone and that is why she came to the er today.

## 2012-01-25 NOTE — ED Provider Notes (Signed)
History  This chart was scribed for non-physician practitioner working with Gerhard Munch, MD by Erskine Emery, ED Scribe. This patient was seen in room TR06C/TR06C and the patient's care was started at 19:53.   CSN: [id number]  Arrival date & time 01/25/12  1818   None     No chief complaint on file.   (Consider location/radiation/quality/duration/timing/severity/associated sxs/prior treatment) The history is provided by the patient. No language interpreter was used.  Cheryl King is a 47 y.o. female who presents to the Emergency Department complaining of intermittent lower back pain that radiates to the right and left butt cheeks and sometimes the legs and neck pain that radiates down the right arm with some tingling since 2009. Pt denies any associated urinary incontinence but does report some mild urinary frequency. Pt reports back in 2009 she had an MRI and was prescribed some gabapentin for her back and neck pain but she was never referred to a specialist. Pt also went to a chiropractor for the same complaint but has never found lasting relief. Pt states she is out of percocet, naproxen, and methylprednisolone and that is why she came to the ED today. Pt reports the percocet and Vicodin she has been taking make her nauseous and she often only takes half a pill when needed. Pt also reports some bilateral heel pain and left knee pain since Christmas day (13 days ago) when she noticed she could not extend her knee and felt it popping.  Past Medical History  Diagnosis Date  . Low back pain   . Sacroiliac inflammation   . Seasonal allergies   . Diverticulitis     Past Surgical History  Procedure Date  . Ankle surgery     lt.  Marland Kitchen Uretherl     uretrral diverticultis  . Abdominal hysterectomy   . Abdominal surgery     No family history on file.  History  Substance Use Topics  . Smoking status: Current Every Day Smoker -- 0.5 packs/day  . Smokeless tobacco: Never Used  .  Alcohol Use: No    OB History    Grav Para Term Preterm Abortions TAB SAB Ect Mult Living                  Review of Systems  Constitutional: Negative for fever and chills.  HENT: Positive for neck pain.   Respiratory: Negative for shortness of breath.   Gastrointestinal: Negative for nausea and vomiting.  Genitourinary: Positive for frequency.  Musculoskeletal: Positive for back pain.       Left knee pain. Bilateral heel pain.  Neurological: Negative for weakness.  All other systems reviewed and are negative.    Allergies  Penicillins  Home Medications   Current Outpatient Rx  Name  Route  Sig  Dispense  Refill  . IBUPROFEN 200 MG PO TABS   Oral   Take 600 mg by mouth 2 (two) times daily as needed. For pain         . ACIDOPHILUS PO   Oral   Take 1 tablet by mouth daily.           . METHYLPREDNISOLONE 4 MG PO KIT      1 dose pack as directed.   21 tablet   0   . ADULT MULTIVITAMIN W/MINERALS CH   Oral   Take 1 tablet by mouth daily.           Marland Kitchen NAPROXEN 500 MG PO TABS   Oral  Take 1 tablet (500 mg total) by mouth 2 (two) times daily as needed.   20 tablet   0   . OMEPRAZOLE PO   Oral   Take 1 capsule by mouth every morning.         Marland Kitchen OVER THE COUNTER MEDICATION   Oral   Take 2 tablets by mouth daily. biosil         . OXYCODONE-ACETAMINOPHEN 5-325 MG PO TABS   Oral   Take 1 tablet by mouth every 4 (four) hours as needed for pain.   12 tablet   0   . VITAMIN B-6 PO   Oral   Take 1 tablet by mouth daily.             Triage Vitals: BP 125/76  Pulse 89  Temp 97.9 F (36.6 C) (Oral)  Resp 18  SpO2 100%  Physical Exam  Nursing note and vitals reviewed. Constitutional: She is oriented to person, place, and time. She appears well-developed and well-nourished. No distress.  HENT:  Head: Normocephalic and atraumatic.  Eyes: EOM are normal. Pupils are equal, round, and reactive to light.  Neck: Neck supple. No tracheal deviation  present.  Cardiovascular: Normal rate, regular rhythm and normal heart sounds.   Pulmonary/Chest: Effort normal. No respiratory distress.  Abdominal: Soft. She exhibits no distension.  Musculoskeletal: Normal range of motion. She exhibits no edema and no tenderness.       Good strength.  Neurological: She is alert and oriented to person, place, and time.  Skin: Skin is warm and dry.  Psychiatric: She has a normal mood and affect.    ED Course  Procedures (including critical care time) DIAGNOSTIC STUDIES: Oxygen Saturation is 100% on room air, normal by my interpretation.    COORDINATION OF CARE: 19:53--I evaluated the patient and we discussed a treatment plan including a short course of steroids, pain medication, and follow up with a back specialist or primary care physician to which the pt agreed. 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   Labs Reviewed - No data to display No results found.   No diagnosis found.  Chronic neck pain d/t degenerative disk disease. Sciatica.  Short course of steroids, NSAID's, PCP follow-up.  MDM  I personally performed the services described in this documentation, which was scribed in my presence. The recorded information has been reviewed and is accurate.         Jimmye Norman, NP 01/25/12 2325

## 2012-01-25 NOTE — ED Notes (Signed)
Pt is here with lower back pain that goes down butt and goes down right.  PT also has complaints of neck pain that radiates down right arm and then has tingling in right arm since 2009.  Pt sts christmas day her left knee started hurting with walking and then could not extend it because it felt like it was popping. Pt is also reporting bilateral heel pains

## 2012-01-26 NOTE — ED Provider Notes (Signed)
Medical screening examination/treatment/procedure(s) were performed by non-physician practitioner and as supervising physician I was immediately available for consultation/collaboration.  Alwin Lanigan, MD 01/26/12 0001 

## 2012-08-25 ENCOUNTER — Encounter (HOSPITAL_COMMUNITY): Payer: Self-pay | Admitting: Emergency Medicine

## 2012-08-25 ENCOUNTER — Emergency Department (HOSPITAL_COMMUNITY)
Admission: EM | Admit: 2012-08-25 | Discharge: 2012-08-25 | Disposition: A | Discharge status: Home / Self Care | Payer: Self-pay | Attending: Emergency Medicine | Admitting: Emergency Medicine

## 2012-08-25 DIAGNOSIS — Z791 Long term (current) use of non-steroidal anti-inflammatories (NSAID): Secondary | ICD-10-CM | POA: Insufficient documentation

## 2012-08-25 DIAGNOSIS — M545 Low back pain, unspecified: Secondary | ICD-10-CM | POA: Insufficient documentation

## 2012-08-25 DIAGNOSIS — Z880 Allergy status to penicillin: Secondary | ICD-10-CM | POA: Insufficient documentation

## 2012-08-25 DIAGNOSIS — Z8719 Personal history of other diseases of the digestive system: Secondary | ICD-10-CM | POA: Insufficient documentation

## 2012-08-25 DIAGNOSIS — Z79899 Other long term (current) drug therapy: Secondary | ICD-10-CM | POA: Insufficient documentation

## 2012-08-25 DIAGNOSIS — IMO0002 Reserved for concepts with insufficient information to code with codable children: Secondary | ICD-10-CM | POA: Insufficient documentation

## 2012-08-25 DIAGNOSIS — Z8739 Personal history of other diseases of the musculoskeletal system and connective tissue: Secondary | ICD-10-CM | POA: Insufficient documentation

## 2012-08-25 DIAGNOSIS — F17200 Nicotine dependence, unspecified, uncomplicated: Secondary | ICD-10-CM | POA: Insufficient documentation

## 2012-08-25 DIAGNOSIS — J309 Allergic rhinitis, unspecified: Secondary | ICD-10-CM | POA: Insufficient documentation

## 2012-08-25 MED ORDER — PREDNISONE 20 MG PO TABS: 60.0000 mg | ORAL_TABLET | Freq: Every day | ORAL | Status: DC

## 2012-08-25 MED ORDER — MELOXICAM 15 MG PO TABS: 15.0000 mg | ORAL_TABLET | Freq: Every day | ORAL | Status: DC

## 2012-08-25 NOTE — ED Notes (Signed)
States has had right lower back pain on and off since April. Has not seen anyone about it "because I couldn't afford the medicine". States pain has become more severe and more constant. Also has tingling to right posterior thigh.

## 2012-08-25 NOTE — ED Provider Notes (Signed)
CSN: [id number]     Arrival date & time 08/25/12  4098 History     First MD Initiated Contact with Patient 08/25/12 1007     Chief Complaint  Patient presents with  . Back Pain   (Consider location/radiation/quality/duration/timing/severity/associated sxs/prior Treatment) HPI Comments: Patient presenting with right sided lower back pain.  Pain radiates to the right buttock and right posterior thigh.  She reports that the pain has been intermittent over the past year.  Pain has become worse over the past couple of days.  She denies acute injury or trauma.  She has taken Aleve and Ibuprofen for the pain without relief.  She reports that in the past Mobic and steroids have helped with the pain.  Patient is a 47 y.o. female presenting with back pain. The history is provided by the patient.  Back Pain Associated symptoms: no abdominal pain, no bladder incontinence, no bowel incontinence, no dysuria, no fever, no numbness, no paresthesias, no tingling and no weakness   Risk factors: no hx of cancer     Past Medical History  Diagnosis Date  . Low back pain   . Sacroiliac inflammation   . Seasonal allergies   . Diverticulitis    Past Surgical History  Procedure Laterality Date  . Ankle surgery      lt.  Marland Kitchen Uretherl      uretrral diverticultis  . Abdominal hysterectomy    . Abdominal surgery     No family history on file. History  Substance Use Topics  . Smoking status: Current Every Day Smoker -- 0.50 packs/day  . Smokeless tobacco: Never Used  . Alcohol Use: No   OB History   Grav Para Term Preterm Abortions TAB SAB Ect Mult Living                 Review of Systems  Constitutional: Negative for fever.  Gastrointestinal: Negative for abdominal pain and bowel incontinence.  Genitourinary: Negative for bladder incontinence and dysuria.  Musculoskeletal: Positive for back pain.  Neurological: Negative for tingling, weakness, numbness and paresthesias.  All other systems  reviewed and are negative.    Allergies  Penicillins  Home Medications   Current Outpatient Rx  Name  Route  Sig  Dispense  Refill  . diphenhydrAMINE (BENADRYL) 25 MG tablet   Oral   Take 25 mg by mouth every 6 (six) hours as needed. For allergy         . HYDROcodone-acetaminophen (NORCO/VICODIN) 5-325 MG per tablet   Oral   Take 1 tablet by mouth every 6 (six) hours as needed for pain.   20 tablet   0   . ibuprofen (ADVIL,MOTRIN) 200 MG tablet   Oral   Take 600 mg by mouth 2 (two) times daily as needed. For pain         . Multiple Vitamin (MULITIVITAMIN WITH MINERALS) TABS   Oral   Take 1 tablet by mouth daily.           . naproxen (NAPROSYN) 500 MG tablet   Oral   Take 1 tablet (500 mg total) by mouth 2 (two) times daily.   30 tablet   0   . predniSONE (STERAPRED UNI-PAK) 10 MG tablet      Take 6 tabs day 1, 5 tabs day 2, 4 tabs day 3, 3 tabs day 4, 2 tabs day 5, 1 tab day 6.   21 tablet   0   . Pyridoxine HCl (VITAMIN B-6 PO)  Oral   Take 1 tablet by mouth daily.            BP 99/74  Pulse 88  Temp(Src) 98.4 F (36.9 C) (Oral)  SpO2 100% Physical Exam  Nursing note and vitals reviewed. Constitutional: She appears well-developed and well-nourished. No distress.  HENT:  Head: Normocephalic and atraumatic.  Neck: Normal range of motion. Neck supple.  Cardiovascular: Normal rate, regular rhythm and normal heart sounds.   Pulmonary/Chest: Effort normal and breath sounds normal.  Musculoskeletal: Normal range of motion.       Lumbar back: She exhibits no tenderness, no bony tenderness, no swelling, no edema and no deformity.       Back:  Neurological: She is alert. She has normal strength. No sensory deficit. Gait normal.  Reflex Scores:      Patellar reflexes are 2+ on the right side and 2+ on the left side.      Achilles reflexes are 2+ on the right side and 2+ on the left side. Skin: Skin is warm and dry. She is not diaphoretic.    Psychiatric: She has a normal mood and affect.    ED Course   Procedures (including critical care time)  Labs Reviewed - No data to display No results found. No diagnosis found.  MDM  Patient with back pain.  No neurological deficits and normal neuro exam.  Patient can walk but states is painful.  No loss of bowel or bladder control.  No concern for cauda equina.  No fever, night sweats, weight loss, h/o cancer, IVDU.  RICE protocol and pain medicine indicated and discussed with patient.   Pascal Lux Parcelas Mandry, PA-C 08/25/12 [phone]

## 2012-08-25 NOTE — ED Provider Notes (Signed)
Medical screening examination/treatment/procedure(s) were performed by non-physician practitioner and as supervising physician I was immediately available for consultation/collaboration.  Geoffery Lyons, MD 08/25/12 [phone]

## 2013-02-01 ENCOUNTER — Encounter (HOSPITAL_COMMUNITY): Payer: Self-pay | Admitting: Emergency Medicine

## 2013-02-01 ENCOUNTER — Emergency Department (HOSPITAL_COMMUNITY)
Admission: EM | Admit: 2013-02-01 | Discharge: 2013-02-01 | Disposition: A | Discharge status: Home / Self Care | Payer: Self-pay | Attending: Emergency Medicine | Admitting: Emergency Medicine

## 2013-02-01 DIAGNOSIS — F17200 Nicotine dependence, unspecified, uncomplicated: Secondary | ICD-10-CM | POA: Insufficient documentation

## 2013-02-01 DIAGNOSIS — R519 Headache, unspecified: Secondary | ICD-10-CM

## 2013-02-01 DIAGNOSIS — M25579 Pain in unspecified ankle and joints of unspecified foot: Secondary | ICD-10-CM | POA: Insufficient documentation

## 2013-02-01 DIAGNOSIS — R51 Headache: Secondary | ICD-10-CM | POA: Insufficient documentation

## 2013-02-01 DIAGNOSIS — G8929 Other chronic pain: Secondary | ICD-10-CM | POA: Insufficient documentation

## 2013-02-01 DIAGNOSIS — Z8719 Personal history of other diseases of the digestive system: Secondary | ICD-10-CM | POA: Insufficient documentation

## 2013-02-01 DIAGNOSIS — Z880 Allergy status to penicillin: Secondary | ICD-10-CM | POA: Insufficient documentation

## 2013-02-01 DIAGNOSIS — M5431 Sciatica, right side: Secondary | ICD-10-CM

## 2013-02-01 DIAGNOSIS — M722 Plantar fascial fibromatosis: Secondary | ICD-10-CM | POA: Insufficient documentation

## 2013-02-01 DIAGNOSIS — M542 Cervicalgia: Secondary | ICD-10-CM | POA: Insufficient documentation

## 2013-02-01 DIAGNOSIS — Z79899 Other long term (current) drug therapy: Secondary | ICD-10-CM | POA: Insufficient documentation

## 2013-02-01 DIAGNOSIS — M5430 Sciatica, unspecified side: Secondary | ICD-10-CM | POA: Insufficient documentation

## 2013-02-01 HISTORY — DX: Sciatica, unspecified side: M54.30

## 2013-02-01 MED ORDER — PREDNISONE 20 MG PO TABS: 40.0000 mg | ORAL_TABLET | Freq: Every day | ORAL | Status: DC

## 2013-02-01 MED ORDER — DIAZEPAM 5 MG PO TABS: 5.0000 mg | ORAL_TABLET | Freq: Two times a day (BID) | ORAL | Status: DC

## 2013-02-01 NOTE — ED Notes (Signed)
Pt reports sciatica with chronic back pain. she's been taking percocet and Vicodin at home with out relief of the pain. She also states she has been having neck pain and headaches since 2009. She has been unable to f/u with a specialist because she does not have insurance. Ambulatory, mae

## 2013-02-01 NOTE — ED Provider Notes (Signed)
Medical screening examination/treatment/procedure(s) were performed by non-physician practitioner and as supervising physician I was immediately available for consultation/collaboration.  EKG Interpretation   None         Elmer Sow, MD 02/01/13 1745

## 2013-02-01 NOTE — ED Notes (Signed)
Pt states she does not have PCP. Comes here when she has back pain. States the "only thing that helps is Prednisone".

## 2013-02-01 NOTE — ED Provider Notes (Signed)
CSN: [id number]     Arrival date & time 02/01/13  1235 History   First MD Initiated Contact with Patient 02/01/13 1327    This chart was scribed for Noland Fordyce PA-C, a non-physician practitioner working with No att. providers found by Denice Bors, ED Scribe. This patient was seen in room TR09C/TR09C and the patient's care was started at 2:21 PM     Chief Complaint  Patient presents with  . Back Pain   (Consider location/radiation/quality/duration/timing/severity/associated sxs/prior Treatment) The history is provided by the patient. No language interpreter was used.   HPI Comments: Cheryl King is a 48 y.o. female who presents to the Emergency Department complaining of waxing and waning chronic low back pain radiating down bilateral legs worse on right side onset 2009. Describes pain as spasms and flaring up since 09/2012 with no precipitating factors. Reports taking percocet and Vicodin with no relief of symptoms. Reports she usually takes prednisone with relief of symptoms and is requesting a prescription. Reports pain is exacerbated while sleeping and worse in supine position, 10/10. Denies associated fall, change in gait, heavy lifting, nausea, emesis, abdominal pain, and recent trauma. Denies urinary or fecal incontinence, urinary retention, perineal/saddle paresthesias, fever, PMHx of cancer, and IV drug use.   Additionally, reports chronic waxing and waning neck pain and occipital headaches. Pt wonders if associated with previous MRI that showed spinal stenosis.  Pt also c/o intermittent bilateral heal pain that is worse when pt stands on feet for long periods of time while cleaning her house. Denies specific injuries to feet.  Past Medical History  Diagnosis Date  . Low back pain   . Sacroiliac inflammation   . Seasonal allergies   . Diverticulitis   . Sciatica    Past Surgical History  Procedure Laterality Date  . Ankle surgery      lt.  Marland Kitchen Uretherl      uretrral  diverticultis  . Abdominal hysterectomy    . Abdominal surgery     History reviewed. No pertinent family history. History  Substance Use Topics  . Smoking status: Current Every Day Smoker -- 0.50 packs/day  . Smokeless tobacco: Never Used  . Alcohol Use: No   OB History   Grav Para Term Preterm Abortions TAB SAB Ect Mult Living                 Review of Systems  Constitutional: Negative for fever.  Gastrointestinal: Negative for nausea, vomiting, abdominal pain and diarrhea.  Musculoskeletal: Positive for back pain.  Neurological: Negative for numbness.  Psychiatric/Behavioral: Negative for confusion.   A complete 10 system review of systems was obtained and all systems are negative except as noted in the HPI and PMHx.   Allergies  Penicillins  Home Medications   Current Outpatient Rx  Name  Route  Sig  Dispense  Refill  . ibuprofen (ADVIL,MOTRIN) 200 MG tablet   Oral   Take 600 mg by mouth 2 (two) times daily as needed for pain.         . Lactobacillus CAPS   Oral   Take 1 capsule by mouth daily.         . Multiple Vitamin (MULTIVITAMIN WITH MINERALS) TABS tablet   Oral   Take 1 tablet by mouth daily.         Marland Kitchen OVER THE COUNTER MEDICATION   Oral   Take 1 tablet by mouth 2 (two) times daily. Hair, Skin, and Nails vitamin         .  OVER THE COUNTER MEDICATION   Oral   Take 2 tablets by mouth every 4 (four) hours. Allergy medication         . oxyCODONE-acetaminophen (PERCOCET/ROXICET) 5-325 MG per tablet   Oral   Take 1 tablet by mouth every 8 (eight) hours as needed for severe pain.         Marland Kitchen Pyridoxine HCl (VITAMIN B-6 PO)   Oral   Take 1 tablet by mouth every other day.         . diazepam (VALIUM) 5 MG tablet   Oral   Take 1 tablet (5 mg total) by mouth 2 (two) times daily.   10 tablet   0   . predniSONE (DELTASONE) 20 MG tablet   Oral   Take 2 tablets (40 mg total) by mouth daily.   10 tablet   0    BP 113/69  Pulse 76   Temp(Src) 98.5 F (36.9 C)  Resp 16  Ht 5\' 6"  (1.676 m)  Wt 134 lb 14.4 oz (61.19 kg)  BMI 21.78 kg/m2  SpO2 97% Physical Exam  Nursing note and vitals reviewed. Constitutional: She is oriented to person, place, and time. She appears well-developed and well-nourished.  HENT:  Head: Normocephalic and atraumatic.  Eyes: Conjunctivae and EOM are normal. Pupils are equal, round, and reactive to light.  Neck: Normal range of motion and full passive range of motion without pain. Neck supple. Muscular tenderness present. No spinous process tenderness present.  Cardiovascular: Normal rate, regular rhythm and normal heart sounds.   Pulmonary/Chest: Effort normal and breath sounds normal. No respiratory distress. She has no wheezes. She has no rales. She exhibits no tenderness.  Abdominal: Soft. She exhibits no distension. There is no tenderness.  Musculoskeletal: Normal range of motion. She exhibits no edema and no tenderness.  No midline C-spine, T-spine, or L-spine tenderness with no step-offs or deformities noted   Antalgic gait.  Neurological: She is alert and oriented to person, place, and time. No cranial nerve deficit. Coordination normal.  Skin: Skin is warm and dry.  Psychiatric: She has a normal mood and affect. Her behavior is normal.    ED Course  Procedures  COORDINATION OF CARE:  Nursing notes reviewed. Vital signs reviewed. Initial pt interview and examination performed.   3:32 PM-Discussed treatment plan with pt at bedside. Pt agrees with plan.   Treatment plan initiated:Medications - No data to display   Initial diagnostic testing ordered.    Labs Review Labs Reviewed - No data to display Imaging Review No results found.  EKG Interpretation   None       MDM   1. Sciatica of right side   2. Plantar fasciitis   3. Neck pain   4. Chronic headaches    Pt c/o exacerbation of her right sided sciatic pain. Pt also c/o chronic neck pain, headaches and  intermittent bilateral heal pain.  Pt requesting Rx for prednisone. Pt also described lower lumbar muscle spasms yesterday.  Rx: prednisone, valium.  Provided pt with info on plantar fascitis, headaches and arthritis. Advised pt to f/u with [facility] and [facility] for ongoing healthcare needs. Return precautions provided. Pt verbalized understanding and agreement with tx plan.   I personally performed the services described in this documentation, which was scribed in my presence. The recorded information has been reviewed and is accurate.    Noland Fordyce, PA-C 02/01/13 1536

## 2013-03-05 ENCOUNTER — Ambulatory Visit: Payer: Self-pay | Attending: Internal Medicine

## 2013-04-02 ENCOUNTER — Ambulatory Visit: Payer: Self-pay | Attending: Internal Medicine

## 2013-04-13 ENCOUNTER — Ambulatory Visit: Payer: Self-pay | Attending: Internal Medicine | Admitting: Internal Medicine

## 2013-04-13 ENCOUNTER — Encounter: Payer: Self-pay | Admitting: Internal Medicine

## 2013-04-13 VITALS — BP 108/79 | HR 82 | Temp 98.3°F | Resp 16 | Wt 131.6 lb

## 2013-04-13 DIAGNOSIS — M545 Low back pain, unspecified: Secondary | ICD-10-CM | POA: Insufficient documentation

## 2013-04-13 DIAGNOSIS — M47812 Spondylosis without myelopathy or radiculopathy, cervical region: Secondary | ICD-10-CM | POA: Insufficient documentation

## 2013-04-13 DIAGNOSIS — Z1211 Encounter for screening for malignant neoplasm of colon: Secondary | ICD-10-CM | POA: Insufficient documentation

## 2013-04-13 DIAGNOSIS — Z139 Encounter for screening, unspecified: Secondary | ICD-10-CM

## 2013-04-13 DIAGNOSIS — M542 Cervicalgia: Secondary | ICD-10-CM | POA: Insufficient documentation

## 2013-04-13 DIAGNOSIS — G8929 Other chronic pain: Secondary | ICD-10-CM | POA: Insufficient documentation

## 2013-04-13 DIAGNOSIS — M5430 Sciatica, unspecified side: Secondary | ICD-10-CM | POA: Insufficient documentation

## 2013-04-13 LAB — COMPLETE METABOLIC PANEL WITH GFR
ALBUMIN: 4.1 g/dL (ref 3.5–5.2)
ALT: 12 U/L (ref 0–35)
AST: 21 U/L (ref 0–37)
Alkaline Phosphatase: 75 U/L (ref 39–117)
BUN: 18 mg/dL (ref 6–23)
CALCIUM: 10 mg/dL (ref 8.4–10.5)
CHLORIDE: 101 meq/L (ref 96–112)
CO2: 29 meq/L (ref 19–32)
Creat: 0.74 mg/dL (ref 0.50–1.10)
GFR, Est Non African American: >89 mL/min
GLUCOSE: 76 mg/dL (ref 70–99)
POTASSIUM: 4.3 meq/L (ref 3.5–5.3)
Sodium: 137 mEq/L (ref 135–145)
Total Bilirubin: 0.4 mg/dL (ref 0.2–1.2)
Total Protein: 7.3 g/dL (ref 6.0–8.3)

## 2013-04-13 LAB — CBC WITH DIFFERENTIAL/PLATELET
BASOS ABS: 0 10*3/uL (ref 0.0–0.1)
Basophils Relative: 0 % (ref 0–1)
Eosinophils Absolute: 0.2 10*3/uL (ref 0.0–0.7)
Eosinophils Relative: 2 % (ref 0–5)
HEMATOCRIT: 46 % (ref 36.0–46.0)
HEMOGLOBIN: 15.9 g/dL — AB (ref 12.0–15.0)
LYMPHS ABS: 3 10*3/uL (ref 0.7–4.0)
LYMPHS PCT: 32 % (ref 12–46)
MCH: 32.2 pg (ref 26.0–34.0)
MCHC: 34.6 g/dL (ref 30.0–36.0)
MCV: 93.1 fL (ref 78.0–100.0)
MONO ABS: 0.9 10*3/uL (ref 0.1–1.0)
MONOS PCT: 10 % (ref 3–12)
NEUTROS ABS: 5.2 10*3/uL (ref 1.7–7.7)
Neutrophils Relative %: 56 % (ref 43–77)
Platelets: 226 10*3/uL (ref 150–400)
RBC: 4.94 MIL/uL (ref 3.87–5.11)
RDW: 14.2 % (ref 11.5–15.5)
WBC: 9.3 10*3/uL (ref 4.0–10.5)

## 2013-04-13 LAB — LIPID PANEL
CHOL/HDL RATIO: 4.8 ratio
Cholesterol: 285 mg/dL — ABNORMAL HIGH (ref 0–200)
HDL: 60 mg/dL (ref >39)
LDL Cholesterol: 203 mg/dL — ABNORMAL HIGH (ref 0–99)
Triglycerides: 111 mg/dL (ref <150)
VLDL: 22 mg/dL (ref 0–40)

## 2013-04-13 MED ORDER — IBUPROFEN 800 MG PO TABS: 800.0000 mg | ORAL_TABLET | Freq: Three times a day (TID) | ORAL | Status: DC | PRN

## 2013-04-13 MED ORDER — CYCLOBENZAPRINE HCL 10 MG PO TABS: 10.0000 mg | ORAL_TABLET | Freq: Every day | ORAL | Status: DC

## 2013-04-13 NOTE — Progress Notes (Signed)
Patient here to establish care Complains of neck pain and sciatica pain Would like referrals to GI for colonoscopy And also would like a mammogram

## 2013-04-13 NOTE — Progress Notes (Signed)
Patient Demographics  Cheryl King, is a 48 y.o. female  WUJ:[id number]  NFA:[id number]  DOB - [birth year]  CC:  Chief Complaint  Patient presents with  . Establish Care       HPI: Cheryl King is a 48 y.o. female here today to establish medical care. Patient reported to have chronic neck pain, had her MRI done in 2009 and reported to have cervical spondylosis, as per patient she sometimes gets numbness tingling all the way in her arms and hands, she also has chronic low back pain radiating down to the legs, she denies any incontinence, denies any fever chills chest pain or shortness of breath. Patient has No headache, No chest pain, No abdominal pain - No Nausea, No new weakness tingling or numbness, No Cough - SOB.  Allergies  Allergen Reactions  . Penicillins Rash   Past Medical History  Diagnosis Date  . Low back pain   . Sacroiliac inflammation   . Seasonal allergies   . Diverticulitis   . Sciatica    Current Outpatient Prescriptions on File Prior to Visit  Medication Sig Dispense Refill  . diazepam (VALIUM) 5 MG tablet Take 1 tablet (5 mg total) by mouth 2 (two) times daily.  10 tablet  0  . ibuprofen (ADVIL,MOTRIN) 200 MG tablet Take 600 mg by mouth 2 (two) times daily as needed for pain.      . Lactobacillus CAPS Take 1 capsule by mouth daily.      . Multiple Vitamin (MULTIVITAMIN WITH MINERALS) TABS tablet Take 1 tablet by mouth daily.      Marland Kitchen OVER THE COUNTER MEDICATION Take 1 tablet by mouth 2 (two) times daily. Hair, Skin, and Nails vitamin      . OVER THE COUNTER MEDICATION Take 2 tablets by mouth every 4 (four) hours. Allergy medication      . oxyCODONE-acetaminophen (PERCOCET/ROXICET) 5-325 MG per tablet Take 1 tablet by mouth every 8 (eight) hours as needed for severe pain.      . predniSONE (DELTASONE) 20 MG tablet Take 2 tablets (40 mg total) by mouth daily.  10 tablet  0  . Pyridoxine HCl (VITAMIN B-6 PO) Take 1 tablet by mouth every other  day.       No current facility-administered medications on file prior to visit.   Family History  Problem Relation Age of Onset  . Asthma Maternal Aunt   . Diabetes Maternal Aunt   . Cancer Maternal Aunt   . Hypertension Maternal Grandmother    History   Social History  . Marital Status: Single    Spouse Name: N/A    Number of Children: N/A  . Years of Education: N/A   Occupational History  . Not on file.   Social History Main Topics  . Smoking status: Current Every Day Smoker -- 0.50 packs/day for 35 years  . Smokeless tobacco: Never Used  . Alcohol Use: No  . Drug Use: No  . Sexual Activity: No   Other Topics Concern  . Not on file   Social History Narrative  . No narrative on file    Review of Systems: Constitutional: Negative for fever, chills, diaphoresis, activity change, appetite change and fatigue. HENT: Negative for ear pain, nosebleeds, congestion, facial swelling, rhinorrhea, neck pain, neck stiffness and ear discharge.  Eyes: Negative for pain, discharge, redness, itching and visual disturbance. Respiratory: Negative for cough, choking, chest tightness, shortness of breath, wheezing and stridor.  Cardiovascular: Negative for chest pain, palpitations  and leg swelling. Gastrointestinal: Negative for abdominal distention. Genitourinary: Negative for dysuria, urgency, frequency, hematuria, flank pain, decreased urine volume, difficulty urinating and dyspareunia.  Musculoskeletal: Positive for back pain, joint swelling, arthralgia and gait problem. Neurological: Negative for dizziness, tremors, seizures, syncope, facial asymmetry, speech difficulty, weakness, light-headedness, numbness and headaches.  Hematological: Negative for adenopathy. Does not bruise/bleed easily. Psychiatric/Behavioral: Negative for hallucinations, behavioral problems, confusion, dysphoric mood, decreased concentration and agitation.    Objective:   Filed Vitals:   04/13/13 1057    BP: 108/79  Pulse: 82  Temp: 98.3 F (36.8 C)  Resp: 16    Physical Exam: Constitutional: Patient appears well-developed and well-nourished. No distress. HENT: Normocephalic, atraumatic, External right and left ear normal. Oropharynx is clear and moist.  Eyes: Conjunctivae and EOM are normal. PERRLA, no scleral icterus. Neck: Normal ROM. Neck supple. No JVD. No tracheal deviation. No thyromegaly. CVS: RRR, S1/S2 +, no murmurs, no gallops, no carotid bruit.  Pulmonary: Effort and breath sounds normal, no stridor, rhonchi, wheezes, rales.  Abdominal: Soft. BS +, no distension, tenderness, rebound or guarding.  Musculoskeletal: Some tenderness in the neck and lower back, SLR test negative.  Neuro: Alert. Normal reflexes, muscle tone coordination. No cranial nerve deficit. Skin: Skin is warm and dry. No rash noted. Not diaphoretic. No erythema. No pallor. Psychiatric: Normal mood and affect. Behavior, judgment, thought content normal.  Lab Results  Component Value Date   WBC 13.2* 11/10/2011   HGB 15.8* 11/10/2011   HCT 44.9 11/10/2011   MCV 94.3 11/10/2011   PLT 223 11/10/2011   Lab Results  Component Value Date   CREATININE 0.68 11/10/2011   BUN 17 11/10/2011   NA 138 11/10/2011   K 3.7 11/10/2011   CL 100 11/10/2011   CO2 30 11/10/2011    No results found for this basename: HGBA1C   Lipid Panel  No results found for this basename: chol, trig, hdl, cholhdl, vldl, ldlcalc       Assessment and plan:   1. Chronic lower back pain Have ordered an MRI lower back, she'll try ibuprofen when necessary for pain prescribed Flexeril, advise for heating pad  - MR Lumbar Spine Wo Contrast; Future - ibuprofen (ADVIL,MOTRIN) 800 MG tablet; Take 1 tablet (800 mg total) by mouth every 8 (eight) hours as needed.  Dispense: 60 tablet; Refill: 1 - cyclobenzaprine (FLEXERIL) 10 MG tablet; Take 1 tablet (10 mg total) by mouth at bedtime.  Dispense: 30 tablet; Refill: 3  2.  Sciatica  - MR Lumbar Spine Wo Contrast; Future  3. Screening Baseline fasting blood work  - CBC with Differential - COMPLETE METABOLIC PANEL WITH GFR - TSH - Lipid panel - Vit D  25 hydroxy (rtn osteoporosis monitoring) - MM DIGITAL SCREENING BILATERAL; Future  4. Special screening for malignant neoplasms, colon  - Ambulatory referral to Gastroenterology  5. Chronic neck pain Pain medication when necessary ordered MRI, last MRI she had done was in 2009  6. Spondylosis of cervical spine  - MR Cervical Spine Wo Contrast; Future        Health Maintenance -Colonoscopy: Referral done  -Mammogram: Ordered  Return in about 3 months (around 07/14/2013) for back pain.   Lorayne Marek, MD

## 2013-04-14 LAB — TSH: TSH: 0.567 u[IU]/mL (ref 0.350–4.500)

## 2013-04-14 LAB — VITAMIN D 25 HYDROXY (VIT D DEFICIENCY, FRACTURES): VIT D 25 HYDROXY: 47 ng/mL (ref 30–89)

## 2013-04-16 ENCOUNTER — Telehealth: Payer: Self-pay

## 2013-04-16 ENCOUNTER — Encounter: Payer: Self-pay | Admitting: Gastroenterology

## 2013-04-16 MED ORDER — ATORVASTATIN CALCIUM 20 MG PO TABS: 20.0000 mg | ORAL_TABLET | Freq: Every day | ORAL | Status: DC

## 2013-04-16 NOTE — Telephone Encounter (Signed)
Message copied by Dorothe Pea on Mon Apr 16, 2013  3:13 PM ------      Message from: Lorayne Marek      Created: Mon Apr 16, 2013  9:37 AM       Blood work reviewed noticed high LDL cholesterol >200 , advise patient for low fat diet and start taking Lipitor 20 mg daily. ------

## 2013-04-16 NOTE — Telephone Encounter (Signed)
Patient not available Left message on voice mail to return our call Medication for lipitor sent to pharmacy on file

## 2013-04-21 ENCOUNTER — Emergency Department (INDEPENDENT_AMBULATORY_CARE_PROVIDER_SITE_OTHER)
Admission: EM | Admit: 2013-04-21 | Discharge: 2013-04-21 | Disposition: A | Discharge status: Home / Self Care | Payer: Self-pay | Source: Home / Self Care | Attending: Family Medicine | Admitting: Family Medicine

## 2013-04-21 DIAGNOSIS — M545 Low back pain, unspecified: Secondary | ICD-10-CM

## 2013-04-21 DIAGNOSIS — M62830 Muscle spasm of back: Secondary | ICD-10-CM

## 2013-04-21 DIAGNOSIS — M5380 Other specified dorsopathies, site unspecified: Secondary | ICD-10-CM

## 2013-04-21 DIAGNOSIS — G8929 Other chronic pain: Secondary | ICD-10-CM

## 2013-04-21 MED ORDER — DIAZEPAM 5 MG PO TABS: 5.0000 mg | ORAL_TABLET | Freq: Three times a day (TID) | ORAL | Status: DC | PRN

## 2013-04-21 MED ORDER — PREDNISONE 50 MG PO TABS: 50.0000 mg | ORAL_TABLET | Freq: Every day | ORAL | Status: DC

## 2013-04-21 NOTE — Discharge Instructions (Signed)
Muscle Cramps and Spasms  Muscle cramps and spasms are when muscles tighten by themselves. They usually get better within minutes. Muscle cramps are painful. They are usually stronger and last longer than muscle spasms. Muscle spasms may or may not be painful. They can last a few seconds or much longer.  HOME CARE  · Drink enough fluid to keep your pee (urine) clear or pale yellow.  · Massage, stretch, and relax the muscle.  · Use a warm towel, heating pad, or warm shower water on tight muscles.  · Place ice on the muscle if it is tender or in pain.  · Put ice in a plastic bag.  · Place a towel between your skin and the bag.  · Leave the ice on for 15-20 minutes, 03-04 times a day.  · Only take medicine as told by your doctor.  GET HELP RIGHT AWAY IF:   Your cramps or spasms get worse, happen more often, or do not get better with time.  MAKE SURE YOU:  · Understand these instructions.  · Will watch your condition.  · Will get help right away if you are not doing well or get worse.  Document Released: 12/18/2007 Document Revised: 05/01/2012 Document Reviewed: 12/22/2011  Exit[employer]® Patient Information ©2014 [employer].

## 2013-04-21 NOTE — ED Notes (Signed)
Complains of back pain and muscle spasms since yesterday

## 2013-04-21 NOTE — ED Provider Notes (Signed)
CSN: [id number]     Arrival date & time 04/21/13  1730 History   First MD Initiated Contact with Patient 04/21/13 1814     Chief Complaint  Patient presents with  . Back Pain   (Consider location/radiation/quality/duration/timing/severity/associated sxs/prior Treatment) HPI  She is here today for muscle spasms in her back.  She states this started yesterday with spasm in her right butt and now in her lower back as well.  She reports grabbing pains with movements that last a few minutes.  Pain does not radiate.  No numbness, tingling, weakness in her legs.  No bowel or bladder dysfunction.  She has a history of chronic low back with sciatica.  States that the only thing that has worked for her in the past is prednisone.  She is currently taking flexeril and ibuprofen 800mg  with no improvement.  She cannot take vicodin or percocet due to nausea.   Past Medical History  Diagnosis Date  . Low back pain   . Sacroiliac inflammation   . Seasonal allergies   . Diverticulitis   . Sciatica    Past Surgical History  Procedure Laterality Date  . Ankle surgery      lt.  Marland Kitchen Uretherl      uretrral diverticultis  . Abdominal hysterectomy    . Abdominal surgery    . Right breast cyst removed      Family History  Problem Relation Age of Onset  . Asthma Maternal Aunt   . Diabetes Maternal Aunt   . Cancer Maternal Aunt   . Hypertension Maternal Grandmother    History  Substance Use Topics  . Smoking status: Current Every Day Smoker -- 0.50 packs/day for 35 years  . Smokeless tobacco: Never Used  . Alcohol Use: No   OB History   Grav Para Term Preterm Abortions TAB SAB Ect Mult Living                 Review of Systems  Gastrointestinal: Negative.   Genitourinary: Negative for difficulty urinating.  Musculoskeletal: Positive for back pain.  Neurological: Negative for weakness and numbness.    Allergies  Penicillins  Home Medications   Current Outpatient Rx  Name  Route  Sig   Dispense  Refill  . atorvastatin (LIPITOR) 20 MG tablet   Oral   Take 1 tablet (20 mg total) by mouth daily.   30 tablet   3   . cyclobenzaprine (FLEXERIL) 10 MG tablet   Oral   Take 1 tablet (10 mg total) by mouth at bedtime.   30 tablet   3   . diazepam (VALIUM) 5 MG tablet   Oral   Take 1 tablet (5 mg total) by mouth 2 (two) times daily.   10 tablet   0   . ibuprofen (ADVIL,MOTRIN) 800 MG tablet   Oral   Take 1 tablet (800 mg total) by mouth every 8 (eight) hours as needed.   60 tablet   1   . Lactobacillus CAPS   Oral   Take 1 capsule by mouth daily.         . Multiple Vitamin (MULTIVITAMIN WITH MINERALS) TABS tablet   Oral   Take 1 tablet by mouth daily.         Marland Kitchen OVER THE COUNTER MEDICATION   Oral   Take 1 tablet by mouth 2 (two) times daily. Hair, Skin, and Nails vitamin         . OVER THE COUNTER MEDICATION  Oral   Take 2 tablets by mouth every 4 (four) hours. Allergy medication         . Pyridoxine HCl (VITAMIN B-6 PO)   Oral   Take 1 tablet by mouth every other day.         . diazepam (VALIUM) 5 MG tablet   Oral   Take 1 tablet (5 mg total) by mouth every 8 (eight) hours as needed for muscle spasms.   20 tablet   0   . predniSONE (DELTASONE) 50 MG tablet   Oral   Take 1 tablet (50 mg total) by mouth daily with breakfast. For 5 days   5 tablet   0    BP 112/74  Pulse 91  Temp(Src) 98.9 F (37.2 C) (Oral)  Resp 18  SpO2 98% Physical Exam  Constitutional: She is oriented to person, place, and time. She appears well-developed and well-nourished. She appears distressed (mild).  Neck: Neck supple.  Musculoskeletal:  Back: no erythema or swelling; no vertebral tenderness or step offs; no paraspinous tenderness; no SI tenderness; tender over mid right buttock  Neurological: She is alert and oriented to person, place, and time. No sensory deficit. She exhibits normal muscle tone.  Reflex Scores:      Patellar reflexes are 2+ on the  right side and 2+ on the left side.      Achilles reflexes are 2+ on the right side and 2+ on the left side. Skin: Skin is warm and dry.    ED Course  Procedures (including critical care time) Labs Review Labs Reviewed - No data to display Imaging Review No results found.   MDM   1. Muscle spasm of back    No red flags for cauda equine or nerve impingement. Will treat with prednisone 50mg  daily x5 days. Also given valium 5mg  to use q8hr prn for muscle spasm. Has MRI scheduled for 05/03/13. Return precautions reviewed. Follow up with PCP.    Melony Overly, MD 04/21/13 [phone]

## 2013-04-23 NOTE — ED Provider Notes (Signed)
Medical screening examination/treatment/procedure(s) were performed by resident physician or non-physician practitioner and as supervising physician I was immediately available for consultation/collaboration.   KINDL,JAMES DOUGLAS MD.   James D Kindl, MD 04/23/13 2119 

## 2013-04-25 ENCOUNTER — Ambulatory Visit (HOSPITAL_COMMUNITY)
Admission: RE | Admit: 2013-04-25 | Discharge: 2013-04-25 | Disposition: A | Discharge status: Home / Self Care | Payer: Self-pay | Source: Ambulatory Visit | Attending: Internal Medicine | Admitting: Internal Medicine

## 2013-04-25 DIAGNOSIS — Z1231 Encounter for screening mammogram for malignant neoplasm of breast: Secondary | ICD-10-CM | POA: Insufficient documentation

## 2013-04-25 DIAGNOSIS — Z139 Encounter for screening, unspecified: Secondary | ICD-10-CM

## 2013-05-02 ENCOUNTER — Ambulatory Visit: Payer: Self-pay | Attending: Internal Medicine | Admitting: Internal Medicine

## 2013-05-02 ENCOUNTER — Encounter: Payer: Self-pay | Admitting: Internal Medicine

## 2013-05-02 VITALS — BP 118/80 | HR 85 | Temp 98.3°F | Resp 16 | Ht 66.5 in | Wt 132.0 lb

## 2013-05-02 DIAGNOSIS — E785 Hyperlipidemia, unspecified: Secondary | ICD-10-CM | POA: Insufficient documentation

## 2013-05-02 DIAGNOSIS — Z79899 Other long term (current) drug therapy: Secondary | ICD-10-CM | POA: Insufficient documentation

## 2013-05-02 DIAGNOSIS — G8929 Other chronic pain: Secondary | ICD-10-CM | POA: Insufficient documentation

## 2013-05-02 DIAGNOSIS — N951 Menopausal and female climacteric states: Secondary | ICD-10-CM

## 2013-05-02 DIAGNOSIS — M545 Low back pain, unspecified: Secondary | ICD-10-CM | POA: Insufficient documentation

## 2013-05-02 DIAGNOSIS — M5430 Sciatica, unspecified side: Secondary | ICD-10-CM | POA: Insufficient documentation

## 2013-05-02 DIAGNOSIS — Z791 Long term (current) use of non-steroidal anti-inflammatories (NSAID): Secondary | ICD-10-CM | POA: Insufficient documentation

## 2013-05-02 DIAGNOSIS — R232 Flushing: Secondary | ICD-10-CM

## 2013-05-02 DIAGNOSIS — M549 Dorsalgia, unspecified: Secondary | ICD-10-CM

## 2013-05-02 MED ORDER — CYCLOBENZAPRINE HCL 10 MG PO TABS: 10.0000 mg | ORAL_TABLET | Freq: Three times a day (TID) | ORAL | Status: DC | PRN

## 2013-05-02 MED ORDER — TRAMADOL HCL 50 MG PO TABS: 50.0000 mg | ORAL_TABLET | Freq: Three times a day (TID) | ORAL | Status: DC | PRN

## 2013-05-02 MED ORDER — ATORVASTATIN CALCIUM 20 MG PO TABS: 20.0000 mg | ORAL_TABLET | Freq: Every day | ORAL | Status: DC

## 2013-05-02 NOTE — Progress Notes (Signed)
Pt is here following up on her chronic lower back pain. Pt is having allergies for two weeks causing her throat to be scratchy.

## 2013-05-02 NOTE — Progress Notes (Signed)
Patient ID: Cheryl King, female   DOB: 1965-03-15, 48 y.o.   MRN: [id number]   CC: chronic back pain  HPI:  Patient presents to clinic today for a follow up visit of her chronic back pain.  Patient went to urgent care on 4/4 for back pain that was increasing in severity.  Patient states that the previously prescribed ibuprofen and flexeril were not helping her at the time.  She reports that she was given a five day supply of prednisone which helped the pain significantly but now it has returned since completing the prednisone.  Patient reports that she is scheduled for a MRI tomorrow morning and hopes that she finds out the problem.  She reports the pain starts in her neck and radiates down her R arm causing numbness and tingling.  She also reports lower back pain that radiates down her right buttock and down her leg, which is exacerbated by prolonged sitting and walking. Patient denies any bowel or bladder dysfunction.  Patient reports that vicodin and percocet make her nausea and she does not want anything similar to those medications.  She explains that she has been having hot flashes for "a while" and she has tried taking black cohosh and estroven OTC but has had no relief.     Allergies  Allergen Reactions  . Penicillins Rash   Past Medical History  Diagnosis Date  . Low back pain   . Sacroiliac inflammation   . Seasonal allergies   . Diverticulitis   . Sciatica    Current Outpatient Prescriptions on File Prior to Visit  Medication Sig Dispense Refill  . cyclobenzaprine (FLEXERIL) 10 MG tablet Take 1 tablet (10 mg total) by mouth at bedtime.  30 tablet  3  . diazepam (VALIUM) 5 MG tablet Take 1 tablet (5 mg total) by mouth 2 (two) times daily.  10 tablet  0  . Lactobacillus CAPS Take 1 capsule by mouth daily.      . Multiple Vitamin (MULTIVITAMIN WITH MINERALS) TABS tablet Take 1 tablet by mouth daily.      Marland Kitchen OVER THE COUNTER MEDICATION Take 1 tablet by mouth 2 (two) times daily.  Hair, Skin, and Nails vitamin      . diazepam (VALIUM) 5 MG tablet Take 1 tablet (5 mg total) by mouth every 8 (eight) hours as needed for muscle spasms.  20 tablet  0  . ibuprofen (ADVIL,MOTRIN) 800 MG tablet Take 1 tablet (800 mg total) by mouth every 8 (eight) hours as needed.  60 tablet  1  . OVER THE COUNTER MEDICATION Take 2 tablets by mouth every 4 (four) hours. Allergy medication      . predniSONE (DELTASONE) 50 MG tablet Take 1 tablet (50 mg total) by mouth daily with breakfast. For 5 days  5 tablet  0  . Pyridoxine HCl (VITAMIN B-6 PO) Take 1 tablet by mouth every other day.       No current facility-administered medications on file prior to visit.   Family History  Problem Relation Age of Onset  . Asthma Maternal Aunt   . Diabetes Maternal Aunt   . Cancer Maternal Aunt   . Hypertension Maternal Grandmother    History   Social History  . Marital Status: Single    Spouse Name: N/A    Number of Children: N/A  . Years of Education: N/A   Occupational History  . Not on file.   Social History Main Topics  . Smoking status: Current Every Day  Smoker -- 0.50 packs/day for 35 years  . Smokeless tobacco: Never Used  . Alcohol Use: No  . Drug Use: No  . Sexual Activity: No   Other Topics Concern  . Not on file   Social History Narrative  . No narrative on file    Review of Systems: Constitutional: Negative for fever, chills, diaphoresis, activity change, appetite change and fatigue. HENT: Negative for ear pain, nosebleeds, congestion, facial swelling, rhinorrhea, neck pain, neck stiffness and ear discharge.  Eyes: Negative for pain, discharge, redness, itching and visual disturbance. Respiratory: Negative for cough, choking, chest tightness, shortness of breath, wheezing and stridor.  Cardiovascular: Negative for chest pain, palpitations and leg swelling. Gastrointestinal: Negative for abdominal distention. Genitourinary: Negative for dysuria, urgency, frequency,  hematuria, flank pain,difficulty urinating Musculoskeletal: Negative for joint swelling, arthralgias. Positive for back, neck pain.  Neurological: Negative for dizziness, tremors, seizures, syncope, facial asymmetry, speech difficulty, weakness, light-headedness, numbness and headaches.    Objective:   Filed Vitals:   05/02/13 1003  BP: 118/80  Pulse: 85  Temp: 98.3 F (36.8 C)  Resp: 16    Physical Exam: Constitutional: Patient appears well-developed and well-nourished. No distress. HENT: Normocephalic, atraumatic, External right and left ear normal. Oropharynx is clear and moist.  Eyes: Conjunctivae normal. PERRLA, no scleral icterus. Neck: Normal ROM. Neck supple. No JVD. . CVS: RRR, S1/S2 +, no murmurs, no gallops, no carotid bruit.  Pulmonary: Effort and breath sounds normal, no stridor, rhonchi, wheezes, rales.  Abdominal: Soft. BS +,  no distension, tenderness, rebound or guarding.  Musculoskeletal: Pain with range of motion of right lower extremity. No edema and no tenderness.  Lymphadenopathy: No lymphadenopathy noted, cervical Neuro: Alert. Normal reflexes Skin: Skin is warm and dry. No rash noted. Not diaphoretic. No erythema. No pallor. Psychiatric: Normal mood and affect. Behavior, judgment, thought content normal.  Lab Results  Component Value Date   WBC 9.3 04/13/2013   HGB 15.9* 04/13/2013   HCT 46.0 04/13/2013   MCV 93.1 04/13/2013   PLT 226 04/13/2013   Lab Results  Component Value Date   CREATININE 0.74 04/13/2013   BUN 18 04/13/2013   NA 137 04/13/2013   K 4.3 04/13/2013   CL 101 04/13/2013   CO2 29 04/13/2013    No results found for this basename: HGBA1C   Lipid Panel     Component Value Date/Time   CHOL 285* 04/13/2013 1152   TRIG 111 04/13/2013 1152   HDL 60 04/13/2013 1152   CHOLHDL 4.8 04/13/2013 1152   VLDL 22 04/13/2013 1152   LDLCALC 203* 04/13/2013 1152       Assessment and plan:   Cheryl King was seen today for follow-up.  Diagnoses and  associated orders for this visit:  Chronic back pain - traMADol (ULTRAM) 50 MG tablet; Take 1 tablet (50 mg total) by mouth every 8 (eight) hours as needed. - cyclobenzaprine (FLEXERIL) 10 MG tablet; Take 1 tablet (10 mg total) by mouth 3 (three) times daily as needed for muscle spasms.  Other and unspecified hyperlipidemia - atorvastatin (LIPITOR) 20 MG tablet; Take 1 tablet (20 mg total) by mouth daily.  Sciatica  Hot flashes --Advised patient to try OTC Vitamin E for hot flashes  Follow up in 3 months for back pain.  Will call patient with results of MRI and will give her further directions from there.  Advised patient to call office back if she has any problems with medication and RTC if symptoms worsen or fail to  improve.        Chari Manning, NP-C [facility] [phone] 05/02/2013, 11:18 AM

## 2013-05-02 NOTE — Patient Instructions (Signed)
Back Pain, Adult Low back pain is very common. About 1 in 5 people have back pain.The cause of low back pain is rarely dangerous. The pain often gets better over time.About half of people with a sudden onset of back pain feel better in just 2 weeks. About 8 in 10 people feel better by 6 weeks.  CAUSES Some common causes of back pain include:  Strain of the muscles or ligaments supporting the spine.  Wear and tear (degeneration) of the spinal discs.  Arthritis.  Direct injury to the back. DIAGNOSIS Most of the time, the direct cause of low back pain is not known.However, back pain can be treated effectively even when the exact cause of the pain is unknown.Answering your caregiver's questions about your overall health and symptoms is one of the most accurate ways to make sure the cause of your pain is not dangerous. If your caregiver needs more information, he or she may order lab work or imaging tests (X-rays or MRIs).However, even if imaging tests show changes in your back, this usually does not require surgery. HOME CARE INSTRUCTIONS For many people, back pain returns.Since low back pain is rarely dangerous, it is often a condition that people can learn to manageon their own.   Remain active. It is stressful on the back to sit or stand in one place. Do not sit, drive, or stand in one place for more than 30 minutes at a time. Take short walks on level surfaces as soon as pain allows.Try to increase the length of time you walk each day.  Do not stay in bed.Resting more than 1 or 2 days can delay your recovery.  Do not avoid exercise or work.Your body is made to move.It is not dangerous to be active, even though your back may hurt.Your back will likely heal faster if you return to being active before your pain is gone.  Pay attention to your body when you bend and lift. Many people have less discomfortwhen lifting if they bend their knees, keep the load close to their bodies,and  avoid twisting. Often, the most comfortable positions are those that put less stress on your recovering back.  Find a comfortable position to sleep. Use a firm mattress and lie on your side with your knees slightly bent. If you lie on your back, put a pillow under your knees.  Only take over-the-counter or prescription medicines as directed by your caregiver. Over-the-counter medicines to reduce pain and inflammation are often the most helpful.Your caregiver may prescribe muscle relaxant drugs.These medicines help dull your pain so you can more quickly return to your normal activities and healthy exercise.  Put ice on the injured area.  Put ice in a plastic bag.  Place a towel between your skin and the bag.  Leave the ice on for 15-20 minutes, 03-04 times a day for the first 2 to 3 days. After that, ice and heat may be alternated to reduce pain and spasms.  Ask your caregiver about trying back exercises and gentle massage. This may be of some benefit.  Avoid feeling anxious or stressed.Stress increases muscle tension and can worsen back pain.It is important to recognize when you are anxious or stressed and learn ways to manage it.Exercise is a great option. SEEK MEDICAL CARE IF:  You have pain that is not relieved with rest or medicine.  You have pain that does not improve in 1 week.  You have new symptoms.  You are generally not feeling well. SEEK   IMMEDIATE MEDICAL CARE IF:   You have pain that radiates from your back into your legs.  You develop new bowel or bladder control problems.  You have unusual weakness or numbness in your arms or legs.  You develop nausea or vomiting.  You develop abdominal pain.  You feel faint. Document Released: 01/04/2005 Document Revised: 07/06/2011 Document Reviewed: 05/25/2010 [employer] Patient Information 2014 [employer].  

## 2013-05-03 ENCOUNTER — Ambulatory Visit (HOSPITAL_COMMUNITY)
Admission: RE | Admit: 2013-05-03 | Discharge: 2013-05-03 | Disposition: A | Discharge status: Home / Self Care | Payer: Self-pay | Source: Ambulatory Visit | Attending: Internal Medicine | Admitting: Internal Medicine

## 2013-05-03 ENCOUNTER — Ambulatory Visit (HOSPITAL_COMMUNITY): Payer: Self-pay

## 2013-05-03 DIAGNOSIS — M542 Cervicalgia: Secondary | ICD-10-CM

## 2013-05-03 DIAGNOSIS — M79609 Pain in unspecified limb: Secondary | ICD-10-CM | POA: Insufficient documentation

## 2013-05-03 DIAGNOSIS — M5430 Sciatica, unspecified side: Secondary | ICD-10-CM

## 2013-05-03 DIAGNOSIS — M545 Low back pain, unspecified: Secondary | ICD-10-CM | POA: Insufficient documentation

## 2013-05-03 DIAGNOSIS — G8929 Other chronic pain: Secondary | ICD-10-CM

## 2013-05-03 DIAGNOSIS — M47812 Spondylosis without myelopathy or radiculopathy, cervical region: Secondary | ICD-10-CM

## 2013-05-04 ENCOUNTER — Telehealth: Payer: Self-pay | Admitting: *Deleted

## 2013-05-04 DIAGNOSIS — M47812 Spondylosis without myelopathy or radiculopathy, cervical region: Secondary | ICD-10-CM

## 2013-05-04 NOTE — Telephone Encounter (Signed)
Pt is aware of her MRI results. I have referred her to neurosurgery.

## 2013-05-04 NOTE — Telephone Encounter (Signed)
Message copied by Joan Mayans on Fri May 04, 2013  2:27 PM ------      Message from: Lorayne Marek      Created: Fri May 04, 2013  1:20 PM       Call and let the  patient know that her cervical spine MRI reported to have degenerative spondylosis and foraminal narrowing which can affect the nerve root and can cause the  symptoms of numbness tingling findings have worsened compared to her prior MRI, patient also has disc bulging in the lumbar region, I would recommend to patient to see the neurosurgery for further management. Help patient to schedule appointment with neurosurgery. ------

## 2013-05-22 ENCOUNTER — Telehealth: Payer: Self-pay | Admitting: Internal Medicine

## 2013-05-22 DIAGNOSIS — G8929 Other chronic pain: Secondary | ICD-10-CM

## 2013-05-22 DIAGNOSIS — M545 Low back pain, unspecified: Secondary | ICD-10-CM

## 2013-05-22 NOTE — Telephone Encounter (Signed)
Pt states that her medication has run out because the medication she was giving states for her to take only one tablet at bedtime and she needs more than just one tablet. Please contact pt

## 2013-05-29 ENCOUNTER — Ambulatory Visit (AMBULATORY_SURGERY_CENTER): Payer: Self-pay

## 2013-05-29 VITALS — Ht 66.5 in | Wt 131.0 lb

## 2013-05-29 DIAGNOSIS — Z1211 Encounter for screening for malignant neoplasm of colon: Secondary | ICD-10-CM

## 2013-05-29 MED ORDER — CYCLOBENZAPRINE HCL 10 MG PO TABS: 10.0000 mg | ORAL_TABLET | Freq: Three times a day (TID) | ORAL | Status: DC

## 2013-05-29 NOTE — Telephone Encounter (Signed)
I filled the flexeril and the Dr. Annitta Needs increase the dosage to TID.

## 2013-05-29 NOTE — Progress Notes (Signed)
No allergies to eggs or soy No past problems with anesthesia No home oxygen No diet/weight loss meds  Has email  Emmi instructions given for colonoscopy 

## 2013-06-07 ENCOUNTER — Ambulatory Visit: Payer: Self-pay | Attending: Internal Medicine | Admitting: *Deleted

## 2013-06-07 VITALS — BP 109/75 | HR 74 | Temp 98.5°F | Resp 14 | Ht 66.0 in | Wt 139.0 lb

## 2013-06-07 DIAGNOSIS — M542 Cervicalgia: Secondary | ICD-10-CM

## 2013-06-07 DIAGNOSIS — M549 Dorsalgia, unspecified: Principal | ICD-10-CM

## 2013-06-07 DIAGNOSIS — G8929 Other chronic pain: Secondary | ICD-10-CM

## 2013-06-07 MED ORDER — TRAMADOL HCL 50 MG PO TABS: 50.0000 mg | ORAL_TABLET | Freq: Three times a day (TID) | ORAL | Status: DC | PRN

## 2013-06-07 NOTE — Patient Instructions (Signed)
Back Pain, Adult Low back pain is very common. About 1 in 5 people have back pain.The cause of low back pain is rarely dangerous. The pain often gets better over time.About half of people with a sudden onset of back pain feel better in just 2 weeks. About 8 in 10 people feel better by 6 weeks.  CAUSES Some common causes of back pain include:  Strain of the muscles or ligaments supporting the spine.  Wear and tear (degeneration) of the spinal discs.  Arthritis.  Direct injury to the back. DIAGNOSIS Most of the time, the direct cause of low back pain is not known.However, back pain can be treated effectively even when the exact cause of the pain is unknown.Answering your caregiver's questions about your overall health and symptoms is one of the most accurate ways to make sure the cause of your pain is not dangerous. If your caregiver needs more information, he or she may order lab work or imaging tests (X-rays or MRIs).However, even if imaging tests show changes in your back, this usually does not require surgery. HOME CARE INSTRUCTIONS For many people, back pain returns.Since low back pain is rarely dangerous, it is often a condition that people can learn to manageon their own.   Remain active. It is stressful on the back to sit or stand in one place. Do not sit, drive, or stand in one place for more than 30 minutes at a time. Take short walks on level surfaces as soon as pain allows.Try to increase the length of time you walk each day.  Do not stay in bed.Resting more than 1 or 2 days can delay your recovery.  Do not avoid exercise or work.Your body is made to move.It is not dangerous to be active, even though your back may hurt.Your back will likely heal faster if you return to being active before your pain is gone.  Pay attention to your body when you bend and lift. Many people have less discomfortwhen lifting if they bend their knees, keep the load close to their bodies,and  avoid twisting. Often, the most comfortable positions are those that put less stress on your recovering back.  Find a comfortable position to sleep. Use a firm mattress and lie on your side with your knees slightly bent. If you lie on your back, put a pillow under your knees.  Only take over-the-counter or prescription medicines as directed by your caregiver. Over-the-counter medicines to reduce pain and inflammation are often the most helpful.Your caregiver may prescribe muscle relaxant drugs.These medicines help dull your pain so you can more quickly return to your normal activities and healthy exercise.  Put ice on the injured area.  Put ice in a plastic bag.  Place a towel between your skin and the bag.  Leave the ice on for 15-20 minutes, 03-04 times a day for the first 2 to 3 days. After that, ice and heat may be alternated to reduce pain and spasms.  Ask your caregiver about trying back exercises and gentle massage. This may be of some benefit.  Avoid feeling anxious or stressed.Stress increases muscle tension and can worsen back pain.It is important to recognize when you are anxious or stressed and learn ways to manage it.Exercise is a great option. SEEK MEDICAL CARE IF:  You have pain that is not relieved with rest or medicine.  You have pain that does not improve in 1 week.  You have new symptoms.  You are generally not feeling well. SEEK   IMMEDIATE MEDICAL CARE IF:   You have pain that radiates from your back into your legs.  You develop new bowel or bladder control problems.  You have unusual weakness or numbness in your arms or legs.  You develop nausea or vomiting.  You develop abdominal pain.  You feel faint. Document Released: 01/04/2005 Document Revised: 07/06/2011 Document Reviewed: 05/25/2010 [employer] Patient Information 2014 [employer].  

## 2013-06-07 NOTE — Progress Notes (Unsigned)
Patient in today complaining neck and back pain. Patient states she has been taking flexeril and ibuprofen but no relief. Patient given a prescription for Tramadol.

## 2013-06-11 ENCOUNTER — Telehealth: Payer: Self-pay | Admitting: Internal Medicine

## 2013-06-11 NOTE — Telephone Encounter (Signed)
Patient called stating she is for colonoscopy tomorrow with Dr. Deatra Ina Was given Suprep, which she took the first half of as instructed. She had vomiting and nearly all ingested preparation. She only had one bowel movement with mostly formed stool I offered changing her to MiraLax prep with Gatorade, but she stated she would not be able to afford any additional prep and wished to reschedule I will notify Dr. Deatra Ina and the office She would likely need to try a different preparation, perhaps with a voucher if cost prohibitive

## 2013-06-12 ENCOUNTER — Telehealth: Payer: Self-pay | Admitting: Gastroenterology

## 2013-06-12 ENCOUNTER — Encounter: Payer: Self-pay | Admitting: Gastroenterology

## 2013-06-12 NOTE — Telephone Encounter (Signed)
no

## 2013-07-05 ENCOUNTER — Other Ambulatory Visit: Payer: Self-pay | Admitting: Internal Medicine

## 2013-07-10 NOTE — Telephone Encounter (Signed)
Unable to Bill Patient No Show Fee-Indigent/yf

## 2013-07-23 ENCOUNTER — Encounter (HOSPITAL_COMMUNITY): Payer: Self-pay | Admitting: Emergency Medicine

## 2013-07-23 ENCOUNTER — Other Ambulatory Visit (HOSPITAL_COMMUNITY)
Admission: RE | Admit: 2013-07-23 | Discharge: 2013-07-23 | Disposition: A | Discharge status: Home / Self Care | Payer: Self-pay | Source: Ambulatory Visit | Attending: Emergency Medicine | Admitting: Emergency Medicine

## 2013-07-23 ENCOUNTER — Emergency Department (INDEPENDENT_AMBULATORY_CARE_PROVIDER_SITE_OTHER)
Admission: EM | Admit: 2013-07-23 | Discharge: 2013-07-23 | Disposition: A | Discharge status: Home / Self Care | Payer: Self-pay | Source: Home / Self Care | Attending: Emergency Medicine | Admitting: Emergency Medicine

## 2013-07-23 DIAGNOSIS — G44219 Episodic tension-type headache, not intractable: Secondary | ICD-10-CM

## 2013-07-23 DIAGNOSIS — Z113 Encounter for screening for infections with a predominantly sexual mode of transmission: Secondary | ICD-10-CM | POA: Insufficient documentation

## 2013-07-23 DIAGNOSIS — M5431 Sciatica, right side: Secondary | ICD-10-CM

## 2013-07-23 DIAGNOSIS — M5430 Sciatica, unspecified side: Secondary | ICD-10-CM

## 2013-07-23 DIAGNOSIS — Z202 Contact with and (suspected) exposure to infections with a predominantly sexual mode of transmission: Secondary | ICD-10-CM

## 2013-07-23 DIAGNOSIS — N760 Acute vaginitis: Secondary | ICD-10-CM | POA: Insufficient documentation

## 2013-07-23 LAB — POCT URINALYSIS DIP (DEVICE)
Bilirubin Urine: NEGATIVE
GLUCOSE, UA: NEGATIVE mg/dL
KETONES UR: 15 mg/dL — AB
Nitrite: NEGATIVE
Protein, ur: NEGATIVE mg/dL
SPECIFIC GRAVITY, URINE: 1.02 (ref 1.005–1.030)
Urobilinogen, UA: 0.2 mg/dL (ref 0.0–1.0)
pH: 5.5 (ref 5.0–8.0)

## 2013-07-23 MED ORDER — PREDNISONE 20 MG PO TABS: ORAL_TABLET | ORAL | Status: DC

## 2013-07-23 MED ORDER — AMITRIPTYLINE HCL 25 MG PO TABS: 25.0000 mg | ORAL_TABLET | Freq: Every day | ORAL | Status: DC

## 2013-07-23 MED ORDER — METRONIDAZOLE 500 MG PO TABS: ORAL_TABLET | ORAL | Status: DC

## 2013-07-23 MED ORDER — DIAZEPAM 2 MG PO TABS: 2.0000 mg | ORAL_TABLET | Freq: Three times a day (TID) | ORAL | Status: DC | PRN

## 2013-07-23 NOTE — ED Notes (Signed)
Pt c/o lower back pain for over a month with uti symptoms.    Having a headache x 1 wk no relief with otc meds.  Treatment/testing for std.  Was told by partner that he tested positive for trich.   Having a vaginal discharge.

## 2013-07-23 NOTE — ED Provider Notes (Signed)
Chief Complaint   Chief Complaint  Patient presents with  . Back Pain  . Urinary Tract Infection  . Exposure to STD    History of Present Illness   Cheryl King is a 48 year old female who presents with 3 problems: Lower back pain, headaches, Trichomonas exposure, and vaginal discharge.  1. Lower back pain: This is been going on for years. [facility] and given ibuprofen, muscle relaxers, and tramadol. This helps minimally. The patient states prednisone diazepam help more than anything else does. The pain radiates down her right leg into her foot with numbness and tingling. There is no muscle weakness, bladder or bowel dysfunction, or saddle anesthesia. She denies any fever, chills, or weight loss. No abdominal pain. She's never had an MRI and never seen a specialist for this.  2. Headaches: The patient has had a several month history of daily right-sided headaches. Describes a dull ache and not associated with nausea, vomiting, photophobia, or phonophobia. Is better if she massages the area. She denies any visual or neurological symptoms. No fever, chills, stiff neck, or neurological symptoms.  3. STD exposure: The patient has been sexually active with a boyfriend and he told her that he will tested positive for Trichomonas. She has not had any vaginal discharge or itching. She denies any pelvic pain. She had some UTI symptoms last week but these are now gone away. She status post hysterectomy.  Review of Systems     Other than as noted above, the patient denies any of the following symptoms: Systemic:  No fever, chills, fatigue, myalgias, headache, or anorexia. Eye:  No redness, pain or drainage. ENT:  No earache, nasal congestion, rhinorrhea, sinus pressure, or sore throat. Lungs:  No cough, sputum production, wheezing, shortness of breath.  Cardiovascular:  No chest pain, palpitations, or syncope. GI:  No nausea, vomiting,  abdominal pain or diarrhea. GU:  No dysuria, frequency, or hematuria. Skin:  No rash or pruritis.   [place]     Past medical history, family history, social history, meds, and allergies were reviewed.    Physical Examination    Vital signs:  BP 123/78  Pulse 68  Temp(Src) 98.3 F (36.8 C) (Oral)  SpO2 100% General:  Alert, in no distress. Eye:  PERRL, full EOMs.  Lids and conjunctivas were normal. ENT:  TMs and canals were normal, without erythema or inflammation.  Nasal mucosa was clear and uncongested, without drainage.  Mucous membranes were moist.  Pharynx was clear, without exudate or drainage.  There were no oral ulcerations or lesions. Neck:  Supple, no adenopathy, tenderness or mass. Thyroid was normal. Lungs:  No respiratory distress.  Lungs were clear to auscultation, without wheezes, rales or rhonchi.  Breath sounds were clear and equal bilaterally. Heart:  Regular rhythm, without gallops, murmers or rubs. Abdomen:  Soft, flat, and non-tender to palpation.  No hepatosplenomagaly or mass. Pelvic: Normal external genitalia, vaginal mucosa was normal. Cervix and uterus were surgically absent. There is a scant whitish discharge. Bimanual examination reveals no masses or tenderness. Back: Full range of motion with pain, straight leg raising was negative. Neurological examination: The patient is alert and oriented x3. Speech is clear, fluent, and appropriate. Cranial nerves are intact. There is no pronator drift and finger to nose was normal. Muscle strength, sensation, and DTRs are normal. Babinskis are downgoing. Station and gait were normal. Romberg sign is negative, patient is able to perform tandem gait well. Skin:  Clear, warm, and dry, without rash or lesions.  Labs    Results for orders placed during the hospital encounter of 07/23/13  POCT URINALYSIS DIP (DEVICE)      Result Value Ref Range   Glucose, UA NEGATIVE  NEGATIVE mg/dL   Bilirubin Urine NEGATIVE  NEGATIVE    Ketones, ur 15 (*) NEGATIVE mg/dL   Specific Gravity, Urine 1.020  1.005 - 1.030   Hgb urine dipstick TRACE (*) NEGATIVE   pH 5.5  5.0 - 8.0   Protein, ur NEGATIVE  NEGATIVE mg/dL   Urobilinogen, UA 0.2  0.0 - 1.0 mg/dL   Nitrite NEGATIVE  NEGATIVE   Leukocytes, UA TRACE (*) NEGATIVE    DNA probe for gonorrhea, Chlamydia, Trichomonas, Gardnerella, Candida were obtained.  Assessment   The primary encounter diagnosis was Sciatica, right. Diagnoses of Episodic tension-type headache, not intractable and Trichomonas exposure were also pertinent to this visit.  Suggested referral to neurosurgery for sciatica. Will treat Trichomonas. Suggested bedtime amitriptyline for the tension type headache, and followup with her primary care physician.  Plan     1.  Meds:  The following meds were prescribed:   Discharge Medication List as of 07/23/2013  6:56 PM    START taking these medications   Details  amitriptyline (ELAVIL) 25 MG tablet Take 1 tablet (25 mg total) by mouth at bedtime., Starting 07/23/2013, Until Discontinued, Print    diazepam (VALIUM) 2 MG tablet Take 1 tablet (2 mg total) by mouth every 8 (eight) hours as needed for anxiety or muscle spasms., Starting 07/23/2013, Until Discontinued, Print    metroNIDAZOLE (FLAGYL) 500 MG tablet Take all 4 tablets at 1 time, Print    predniSONE (DELTASONE) 20 MG tablet Take 3 daily for 5 days, 2 daily for 5 days, 1 daily for 5 days., Print        2.  Patient Education/Counseling:  The patient was given appropriate handouts, self care instructions, and instructed in symptomatic relief.    3.  Follow up:  The patient was told to follow up here if no better in 3 to 4 days, or sooner if becoming worse in any way, and given some red flag symptoms such as fever, new neurological symptoms, bladder or bowel dysfunction, stiff neck, or severe pelvic pain which would prompt immediate return.  Follow up with neurosurgery for sciatica, and her primary care  physician for the headaches.        Harden Mo, MD 07/23/13 2239

## 2013-07-23 NOTE — Discharge Instructions (Signed)
Do exercises twice daily followed by moist heat for 15 minutes. ° ° ° ° ° °Try to be as active as possible. ° °If no better in 2 weeks, follow up with orthopedist. ° ° °

## 2013-07-25 ENCOUNTER — Telehealth (HOSPITAL_COMMUNITY): Payer: Self-pay | Admitting: *Deleted

## 2013-07-25 NOTE — ED Notes (Addendum)
GC/chlamydia neg., Affirm: Candida neg., Gardnerella and Trich pos.  Pt. adequately treated with Flagyl.  I called pt. and left a message to call.  Call 1. Cheryl King 07/25/2013 Pt. called back.  Pt. verified x 2 and given result.  Pt.instructed to notify your partner to be treated with Flagyl.  She said he told her and has already been treated.  She said she only got 4 pills. I told her that will treat the Trich but BV treatment is for 1 week.  I asked if she has any symptoms.  She said no.  I told her if no symptoms, no further treatment necessary. Instructed to practice safe sex. Cheryl King 07/25/2013

## 2013-08-01 ENCOUNTER — Ambulatory Visit: Payer: Self-pay | Attending: Internal Medicine | Admitting: Internal Medicine

## 2013-08-01 ENCOUNTER — Encounter: Payer: Self-pay | Admitting: Internal Medicine

## 2013-08-01 ENCOUNTER — Other Ambulatory Visit: Payer: Self-pay | Admitting: Internal Medicine

## 2013-08-01 VITALS — BP 122/78 | HR 70 | Temp 98.0°F | Resp 16 | Ht 66.5 in | Wt 135.0 lb

## 2013-08-01 DIAGNOSIS — M545 Low back pain, unspecified: Secondary | ICD-10-CM | POA: Insufficient documentation

## 2013-08-01 DIAGNOSIS — R519 Headache, unspecified: Secondary | ICD-10-CM

## 2013-08-01 DIAGNOSIS — F17200 Nicotine dependence, unspecified, uncomplicated: Secondary | ICD-10-CM | POA: Insufficient documentation

## 2013-08-01 DIAGNOSIS — R232 Flushing: Secondary | ICD-10-CM | POA: Insufficient documentation

## 2013-08-01 DIAGNOSIS — R51 Headache: Secondary | ICD-10-CM | POA: Insufficient documentation

## 2013-08-01 DIAGNOSIS — M5441 Lumbago with sciatica, right side: Secondary | ICD-10-CM

## 2013-08-01 DIAGNOSIS — M5430 Sciatica, unspecified side: Secondary | ICD-10-CM | POA: Insufficient documentation

## 2013-08-01 NOTE — Progress Notes (Signed)
Pt is here following up on her lower back pain.

## 2013-08-01 NOTE — Progress Notes (Signed)
Patient ID: Cheryl King, female   DOB: [birth year], 48 y.o.   MRN: [id number]  CC: Headaches  HPI:  Patient reports that for the past two weeks she has been having really bad headaches daily.  She reports that it last for hours to a whole day.  She was given elavil by the urgent care a few days ago which has not helped the pain.  Patient presents today for c/o lower back pain (sharp shooting) that radiates to her right buttocks and right leg.  Patient c/o of continued hot flashes and mood swings related to menopause.  She has tried black cohosh and the estroven with no relief.  Patient would like something different at this time.    Allergies  Allergen Reactions  . Percocet [Oxycodone-Acetaminophen] Nausea Only  . Vicodin [Hydrocodone-Acetaminophen] Nausea Only  . Penicillins Rash   Past Medical History  Diagnosis Date  . Low back pain   . Sacroiliac inflammation   . Seasonal allergies   . Diverticulitis   . Sciatica    Current Outpatient Prescriptions on File Prior to Visit  Medication Sig Dispense Refill  . amitriptyline (ELAVIL) 25 MG tablet Take 1 tablet (25 mg total) by mouth at bedtime.  30 tablet  0  . diazepam (VALIUM) 2 MG tablet Take 1 tablet (2 mg total) by mouth every 8 (eight) hours as needed for anxiety or muscle spasms.  30 tablet  0  . Lactobacillus CAPS Take 1 capsule by mouth daily.      . predniSONE (DELTASONE) 20 MG tablet Take 3 daily for 5 days, 2 daily for 5 days, 1 daily for 5 days.  30 tablet  0  . atorvastatin (LIPITOR) 20 MG tablet Take 1 tablet (20 mg total) by mouth daily.  30 tablet  3  . cyclobenzaprine (FLEXERIL) 10 MG tablet Take 1 tablet (10 mg total) by mouth 3 (three) times daily.  90 tablet  0  . ibuprofen (ADVIL,MOTRIN) 800 MG tablet Take 1 tablet (800 mg total) by mouth every 8 (eight) hours as needed.  60 tablet  1  . metroNIDAZOLE (FLAGYL) 500 MG tablet Take all 4 tablets at 1 time  4 tablet  0  . Multiple Vitamin (MULTIVITAMIN WITH MINERALS)  TABS tablet Take 1 tablet by mouth daily.      Marland Kitchen OVER THE COUNTER MEDICATION Take 1 tablet by mouth 2 (two) times daily. Hair, Skin, and Nails vitamin      . OVER THE COUNTER MEDICATION Take 2 tablets by mouth every 4 (four) hours. Allergy medication      . Pyridoxine HCl (VITAMIN B-6 PO) Take 1 tablet by mouth every other day.      . traMADol (ULTRAM) 50 MG tablet Take 1 tablet (50 mg total) by mouth every 8 (eight) hours as needed.  45 tablet  0   No current facility-administered medications on file prior to visit.   Family History  Problem Relation Age of Onset  . Asthma Maternal Aunt   . Diabetes Maternal Aunt   . Cancer Maternal Aunt   . Hypertension Maternal Grandmother   . Colon cancer Neg Hx   . Pancreatic cancer Neg Hx   . Rectal cancer Neg Hx   . Stomach cancer Neg Hx    History   Social History  . Marital Status: Single    Spouse Name: N/A    Number of Children: N/A  . Years of Education: N/A   Occupational History  . Not on file.  Social History Main Topics  . Smoking status: Current Every Day Smoker -- 0.50 packs/day for 35 years  . Smokeless tobacco: Never Used  . Alcohol Use: No  . Drug Use: No  . Sexual Activity: No   Other Topics Concern  . Not on file   Social History Narrative  . No narrative on file    Review of Systems: See history of present illness   Objective:   Filed Vitals:   08/01/13 1053  BP: 122/78  Pulse: 70  Temp: 98 F (36.7 C)  Resp: 16    Physical Exam: Constitutional: Patient appears well-developed and well-nourished. No distress. HENT: Normocephalic, atraumatic, External right and left ear normal. Oropharynx is clear and moist.  Eyes: Conjunctivae and EOM are normal. PERRLA, no scleral icterus. Neck: Normal ROM. Neck supple. No JVD. No tracheal deviation. No thyromegaly. CVS: RRR, S1/S2 +, no murmurs, no gallops, no carotid bruit.  Pulmonary: Effort and breath sounds normal, no stridor, rhonchi, wheezes, rales.   Abdominal: Soft. BS +,  no distension, tenderness, rebound or guarding.  Musculoskeletal: Normal range of motion. No edema and no tenderness. Straight leg raise is negative bilaterally. Lymphadenopathy: No lymphadenopathy noted, cervical, Neuro: Alert. Normal reflexes, muscle tone coordination. No cranial nerve deficit. Skin: Skin is warm and dry. No rash noted. Not diaphoretic. No erythema. No pallor. Psychiatric: Normal mood and affect. Behavior, judgment, thought content normal.  Lab Results  Component Value Date   WBC 9.3 04/13/2013   HGB 15.9* 04/13/2013   HCT 46.0 04/13/2013   MCV 93.1 04/13/2013   PLT 226 04/13/2013   Lab Results  Component Value Date   CREATININE 0.74 04/13/2013   BUN 18 04/13/2013   NA 137 04/13/2013   K 4.3 04/13/2013   CL 101 04/13/2013   CO2 29 04/13/2013    No results found for this basename: HGBA1C   Lipid Panel     Component Value Date/Time   CHOL 285* 04/13/2013 1152   TRIG 111 04/13/2013 1152   HDL 60 04/13/2013 1152   CHOLHDL 4.8 04/13/2013 1152   VLDL 22 04/13/2013 1152   LDLCALC 203* 04/13/2013 1152       Assessment and plan:   Orine was seen today for follow-up.  Diagnoses and associated orders for this visit:  Frequent headaches Continue Elavil Midline low back pain with right-sided sciatica Once patient gets orange card, send referral for sports medicine May continue with current medications given by urgent care Hot Flashes Patient wants to continue elavil until completed and possibly switch to paxil for menopausal symptoms  Return in about 6 months (around 02/01/2014) for HLD.   Chari Manning, NP-C [facility] [phone] 08/08/2013, 9:24 AM

## 2013-08-01 NOTE — Patient Instructions (Signed)
Back Pain, Adult Back pain is very common. The pain often gets better over time. The cause of back pain is usually not dangerous. Most people can learn to manage their back pain on their own.  HOME CARE   Stay active. Start with short walks on flat ground if you can. Try to walk farther each day.  Do not sit, drive, or stand in one place for more than 30 minutes. Do not stay in bed.  Do not avoid exercise or work. Activity can help your back heal faster.  Be careful when you bend or lift an object. Bend at your knees, keep the object close to you, and do not twist.  Sleep on a firm mattress. Lie on your side, and bend your knees. If you lie on your back, put a pillow under your knees.  Only take medicines as told by your doctor.  Put ice on the injured area.  Put ice in a plastic bag.  Place a towel between your skin and the bag.  Leave the ice on for 15-20 minutes, 03-04 times a day for the first 2 to 3 days. After that, you can switch between ice and heat packs.  Ask your doctor about back exercises or massage.  Avoid feeling anxious or stressed. Find good ways to deal with stress, such as exercise. GET HELP RIGHT AWAY IF:   Your pain does not go away with rest or medicine.  Your pain does not go away in 1 week.  You have new problems.  You do not feel well.  The pain spreads into your legs.  You cannot control when you poop (bowel movement) or pee (urinate).  Your arms or legs feel weak or lose feeling (numbness).  You feel sick to your stomach (nauseous) or throw up (vomit).  You have belly (abdominal) pain.  You feel like you may pass out (faint). MAKE SURE YOU:   Understand these instructions.  Will watch your condition.  Will get help right away if you are not doing well or get worse. Document Released: 06/23/2007 Document Revised: 03/29/2011 Document Reviewed: 05/25/2010 ExitCare Patient Information 2015 [employer]. This information is not intended  to replace advice given to you by your health care provider. Make sure you discuss any questions you have with your health care provider.  

## 2013-08-01 NOTE — Progress Notes (Deleted)
Subjective:     Patient ID: Cheryl King, female   DOB: June 28, 1965, 48 y.o.   MRN: [id number]  HPI   Review of Systems     Objective:   Physical Exam     Assessment:     ***    Plan:     ***

## 2013-08-24 ENCOUNTER — Ambulatory Visit: Payer: Self-pay | Attending: Internal Medicine

## 2013-08-27 ENCOUNTER — Encounter: Payer: Self-pay | Admitting: Internal Medicine

## 2013-08-27 ENCOUNTER — Ambulatory Visit: Payer: Self-pay | Attending: Internal Medicine | Admitting: Internal Medicine

## 2013-08-27 VITALS — BP 114/68 | HR 69 | Temp 97.9°F | Resp 16 | Ht 66.5 in | Wt 138.4 lb

## 2013-08-27 DIAGNOSIS — R109 Unspecified abdominal pain: Secondary | ICD-10-CM

## 2013-08-27 DIAGNOSIS — Z880 Allergy status to penicillin: Secondary | ICD-10-CM | POA: Insufficient documentation

## 2013-08-27 DIAGNOSIS — M549 Dorsalgia, unspecified: Secondary | ICD-10-CM

## 2013-08-27 DIAGNOSIS — M545 Low back pain, unspecified: Secondary | ICD-10-CM | POA: Insufficient documentation

## 2013-08-27 DIAGNOSIS — M542 Cervicalgia: Secondary | ICD-10-CM

## 2013-08-27 DIAGNOSIS — E785 Hyperlipidemia, unspecified: Secondary | ICD-10-CM | POA: Insufficient documentation

## 2013-08-27 DIAGNOSIS — G8929 Other chronic pain: Secondary | ICD-10-CM | POA: Insufficient documentation

## 2013-08-27 DIAGNOSIS — F17200 Nicotine dependence, unspecified, uncomplicated: Secondary | ICD-10-CM | POA: Insufficient documentation

## 2013-08-27 DIAGNOSIS — M5430 Sciatica, unspecified side: Secondary | ICD-10-CM | POA: Insufficient documentation

## 2013-08-27 DIAGNOSIS — M5441 Lumbago with sciatica, right side: Secondary | ICD-10-CM

## 2013-08-27 LAB — POCT URINALYSIS DIPSTICK
BILIRUBIN UA: NEGATIVE
Blood, UA: NEGATIVE
GLUCOSE UA: NEGATIVE
KETONES UA: NEGATIVE
LEUKOCYTES UA: NEGATIVE
Nitrite, UA: NEGATIVE
PROTEIN UA: NEGATIVE
Spec Grav, UA: 1.025
Urobilinogen, UA: 0.2
pH, UA: 6.5

## 2013-08-27 MED ORDER — OMEPRAZOLE 20 MG PO CPDR: 20.0000 mg | DELAYED_RELEASE_CAPSULE | Freq: Every day | ORAL | Status: DC

## 2013-08-27 MED ORDER — TRAMADOL HCL 50 MG PO TABS: 50.0000 mg | ORAL_TABLET | Freq: Three times a day (TID) | ORAL | Status: DC | PRN

## 2013-08-27 MED ORDER — CYCLOBENZAPRINE HCL 10 MG PO TABS: 10.0000 mg | ORAL_TABLET | Freq: Three times a day (TID) | ORAL | Status: DC

## 2013-08-27 MED ORDER — ATORVASTATIN CALCIUM 20 MG PO TABS: 20.0000 mg | ORAL_TABLET | Freq: Every day | ORAL | Status: DC

## 2013-08-27 NOTE — Patient Instructions (Signed)
Sciatica Sciatica is pain, weakness, numbness, or tingling along the path of the sciatic nerve. The nerve starts in the lower back and runs down the back of each leg. The nerve controls the muscles in the lower leg and in the back of the knee, while also providing sensation to the back of the thigh, lower leg, and the sole of your foot. Sciatica is a symptom of another medical condition. For instance, nerve damage or certain conditions, such as a herniated disk or bone spur on the spine, pinch or put pressure on the sciatic nerve. This causes the pain, weakness, or other sensations normally associated with sciatica. Generally, sciatica only affects one side of the body. CAUSES   Herniated or slipped disc.  Degenerative disk disease.  A pain disorder involving the narrow muscle in the buttocks (piriformis syndrome).  Pelvic injury or fracture.  Pregnancy.  Tumor (rare). SYMPTOMS  Symptoms can vary from mild to very severe. The symptoms usually travel from the low back to the buttocks and down the back of the leg. Symptoms can include:  Mild tingling or dull aches in the lower back, leg, or hip.  Numbness in the back of the calf or sole of the foot.  Burning sensations in the lower back, leg, or hip.  Sharp pains in the lower back, leg, or hip.  Leg weakness.  Severe back pain inhibiting movement. These symptoms may get worse with coughing, sneezing, laughing, or prolonged sitting or standing. Also, being overweight may worsen symptoms. DIAGNOSIS  Your caregiver will perform a physical exam to look for common symptoms of sciatica. He or she may ask you to do certain movements or activities that would trigger sciatic nerve pain. Other tests may be performed to find the cause of the sciatica. These may include:  Blood tests.  X-rays.  Imaging tests, such as an MRI or CT scan. TREATMENT  Treatment is directed at the cause of the sciatic pain. Sometimes, treatment is not necessary  and the pain and discomfort goes away on its own. If treatment is needed, your caregiver may suggest:  Over-the-counter medicines to relieve pain.  Prescription medicines, such as anti-inflammatory medicine, muscle relaxants, or narcotics.  Applying heat or ice to the painful area.  Steroid injections to lessen pain, irritation, and inflammation around the nerve.  Reducing activity during periods of pain.  Exercising and stretching to strengthen your abdomen and improve flexibility of your spine. Your caregiver may suggest losing weight if the extra weight makes the back pain worse.  Physical therapy.  Surgery to eliminate what is pressing or pinching the nerve, such as a bone spur or part of a herniated disk. HOME CARE INSTRUCTIONS   Only take over-the-counter or prescription medicines for pain or discomfort as directed by your caregiver.  Apply ice to the affected area for 20 minutes, 3-4 times a day for the first 48-72 hours. Then try heat in the same way.  Exercise, stretch, or perform your usual activities if these do not aggravate your pain.  Attend physical therapy sessions as directed by your caregiver.  Keep all follow-up appointments as directed by your caregiver.  Do not wear high heels or shoes that do not provide proper support.  Check your mattress to see if it is too soft. A firm mattress may lessen your pain and discomfort. SEEK IMMEDIATE MEDICAL CARE IF:   You lose control of your bowel or bladder (incontinence).  You have increasing weakness in the lower back, pelvis, buttocks,   or legs.  You have redness or swelling of your back.  You have a burning sensation when you urinate.  You have pain that gets worse when you lie down or awakens you at night.  Your pain is worse than you have experienced in the past.  Your pain is lasting longer than 4 weeks.  You are suddenly losing weight without reason. MAKE SURE YOU:  Understand these  instructions.  Will watch your condition.  Will get help right away if you are not doing well or get worse. Document Released: 12/29/2000 Document Revised: 07/06/2011 Document Reviewed: 05/16/2011 Exit[employer] Patient Information 2015 [employer]. This information is not intended to replace advice given to you by your health care provider. Make sure you discuss any questions you have with your health care provider.  

## 2013-08-27 NOTE — Progress Notes (Signed)
Patient ID: Cheryl King, female   DOB: November 02, 1965, 47 y.o.   MRN: [id number]  CC: Back pain  HPI:  Patient presents to clinic today with c/o of low back pain.  She has had a history of low back with right sided sciatica.  She has recently stopped the elavil for headaches and wants something to help with the back pain.  She states the pain is now worse and is affecting her mobility.  Patient has been evaluated for same issue multiple times in the past and now has the hospital discount.  She would like a referral to sports medicine.  She denies bowel or bladder dysfunction.    Allergies  Allergen Reactions  . Percocet [Oxycodone-Acetaminophen] Nausea Only  . Vicodin [Hydrocodone-Acetaminophen] Nausea Only  . Penicillins Rash   Past Medical History  Diagnosis Date  . Low back pain   . Sacroiliac inflammation   . Seasonal allergies   . Diverticulitis   . Sciatica    Current Outpatient Prescriptions on File Prior to Visit  Medication Sig Dispense Refill  . atorvastatin (LIPITOR) 20 MG tablet Take 1 tablet (20 mg total) by mouth daily.  30 tablet  3  . ibuprofen (ADVIL,MOTRIN) 800 MG tablet TAKE 1 TABLET BY MOUTH EVERY 8 HOURS AS NEEDED.  60 tablet  1  . Lactobacillus CAPS Take 1 capsule by mouth daily.      . Multiple Vitamin (MULTIVITAMIN WITH MINERALS) TABS tablet Take 1 tablet by mouth daily.      Marland Kitchen OVER THE COUNTER MEDICATION Take 1 tablet by mouth 2 (two) times daily. Hair, Skin, and Nails vitamin      . OVER THE COUNTER MEDICATION Take 2 tablets by mouth every 4 (four) hours. Allergy medication      . amitriptyline (ELAVIL) 25 MG tablet Take 1 tablet (25 mg total) by mouth at bedtime.  30 tablet  0  . cyclobenzaprine (FLEXERIL) 10 MG tablet Take 1 tablet (10 mg total) by mouth 3 (three) times daily.  90 tablet  0  . diazepam (VALIUM) 2 MG tablet Take 1 tablet (2 mg total) by mouth every 8 (eight) hours as needed for anxiety or muscle spasms.  30 tablet  0  . metroNIDAZOLE  (FLAGYL) 500 MG tablet Take all 4 tablets at 1 time  4 tablet  0  . predniSONE (DELTASONE) 20 MG tablet Take 3 daily for 5 days, 2 daily for 5 days, 1 daily for 5 days.  30 tablet  0  . Pyridoxine HCl (VITAMIN B-6 PO) Take 1 tablet by mouth every other day.      . traMADol (ULTRAM) 50 MG tablet Take 1 tablet (50 mg total) by mouth every 8 (eight) hours as needed.  45 tablet  0   No current facility-administered medications on file prior to visit.   Family History  Problem Relation Age of Onset  . Asthma Maternal Aunt   . Diabetes Maternal Aunt   . Cancer Maternal Aunt   . Hypertension Maternal Grandmother   . Colon cancer Neg Hx   . Pancreatic cancer Neg Hx   . Rectal cancer Neg Hx   . Stomach cancer Neg Hx    History   Social History  . Marital Status: Single    Spouse Name: N/A    Number of Children: N/A  . Years of Education: N/A   Occupational History  . Not on file.   Social History Main Topics  . Smoking status: Current Every Day Smoker --  0.50 packs/day for 35 years  . Smokeless tobacco: Never Used  . Alcohol Use: No  . Drug Use: No  . Sexual Activity: No   Other Topics Concern  . Not on file   Social History Narrative  . No narrative on file   Review of Systems  Gastrointestinal: Negative.   Genitourinary: Negative.   Musculoskeletal: Positive for back pain.   .    Objective:   Filed Vitals:   08/27/13 1554  BP: 114/68  Pulse: 69  Temp: 97.9 F (36.6 C)  Resp: 16   Physical Exam  HENT:  Right Ear: External ear normal.  Left Ear: External ear normal.  Mouth/Throat: Oropharynx is clear and moist.  Cardiovascular: Normal rate, regular rhythm and normal heart sounds.   Pulmonary/Chest: Effort normal and breath sounds normal.  Abdominal: Soft. Bowel sounds are normal.  Psychiatric: She has a normal mood and affect.     Lab Results  Component Value Date   WBC 9.3 04/13/2013   HGB 15.9* 04/13/2013   HCT 46.0 04/13/2013   MCV 93.1 04/13/2013    PLT 226 04/13/2013   Lab Results  Component Value Date   CREATININE 0.74 04/13/2013   BUN 18 04/13/2013   NA 137 04/13/2013   K 4.3 04/13/2013   CL 101 04/13/2013   CO2 29 04/13/2013    No results found for this basename: HGBA1C   Lipid Panel     Component Value Date/Time   CHOL 285* 04/13/2013 1152   TRIG 111 04/13/2013 1152   HDL 60 04/13/2013 1152   CHOLHDL 4.8 04/13/2013 1152   VLDL 22 04/13/2013 1152   LDLCALC 203* 04/13/2013 1152       Assessment and plan:   Leidi was seen today for back pain and abdominal pain.  Diagnoses and associated orders for this visit:  Abdominal pain, unspecified site - Urinalysis Dipstick - omeprazole (PRILOSEC) 20 MG capsule; Take 1 capsule (20 mg total) by mouth daily. - Cervicovaginal ancillary only  Right-sided low back pain with right-sided sciatica - Ambulatory referral to Sports Medicine  Chronic neck and back pain - traMADol (ULTRAM) 50 MG tablet; Take 1 tablet (50 mg total) by mouth every 8 (eight) hours as needed. - cyclobenzaprine (FLEXERIL) 10 MG tablet; Take 1 tablet (10 mg total) by mouth 3 (three) times daily.  Other and unspecified hyperlipidemia - atorvastatin (LIPITOR) 20 MG tablet; Take 1 tablet (20 mg total) by mouth daily.         Chari Manning, NP-C [facility] [phone] 08/27/2013, 4:38 PM

## 2013-08-27 NOTE — Progress Notes (Signed)
Patient presents for back pain for 2 years worsening over since 08/06/13 States pain begins in low back and radiates to right buttock down to right foot Also c/o 1 week history of right lower abdominal pain Denies s/sx of UTI Wants to quit smoking

## 2013-08-30 ENCOUNTER — Telehealth: Payer: Self-pay | Admitting: Emergency Medicine

## 2013-08-30 MED ORDER — METRONIDAZOLE 0.75 % VA GEL: 1.0000 | Freq: Every day | VAGINAL | Status: DC

## 2013-08-30 NOTE — Telephone Encounter (Signed)
Message copied by Ricci Barker on Thu Aug 30, 2013  3:56 PM ------      Message from: Chari Manning A      Created: Wed Aug 29, 2013  9:48 PM       Let patient know that STD were negative but she does need to be treated again for BV.  Please send her Metrogel (1 applicator) to be used daily for 7 days.  Thanks ------

## 2013-08-30 NOTE — Telephone Encounter (Signed)
Left message pt to call when message received Medication Metrogel ordered and [facility]

## 2013-08-31 ENCOUNTER — Telehealth: Payer: Self-pay | Admitting: Emergency Medicine

## 2013-08-31 NOTE — Telephone Encounter (Signed)
Pt called in requesting pap smear results. Results given with instructions to pick script @ [place]

## 2013-10-03 ENCOUNTER — Other Ambulatory Visit: Payer: Self-pay | Admitting: Internal Medicine

## 2013-10-04 ENCOUNTER — Other Ambulatory Visit: Payer: Self-pay

## 2013-10-04 MED ORDER — IBUPROFEN 800 MG PO TABS: ORAL_TABLET | ORAL | Status: DC

## 2013-10-09 ENCOUNTER — Ambulatory Visit: Payer: Self-pay | Attending: Internal Medicine | Admitting: Internal Medicine

## 2013-10-09 ENCOUNTER — Encounter: Payer: Self-pay | Admitting: Internal Medicine

## 2013-10-09 VITALS — BP 92/59 | HR 87 | Temp 97.8°F | Resp 18 | Ht 66.5 in | Wt 142.0 lb

## 2013-10-09 DIAGNOSIS — F17200 Nicotine dependence, unspecified, uncomplicated: Secondary | ICD-10-CM | POA: Insufficient documentation

## 2013-10-09 DIAGNOSIS — Z79899 Other long term (current) drug therapy: Secondary | ICD-10-CM | POA: Insufficient documentation

## 2013-10-09 DIAGNOSIS — R599 Enlarged lymph nodes, unspecified: Secondary | ICD-10-CM | POA: Insufficient documentation

## 2013-10-09 DIAGNOSIS — M542 Cervicalgia: Secondary | ICD-10-CM | POA: Insufficient documentation

## 2013-10-09 DIAGNOSIS — M545 Low back pain, unspecified: Secondary | ICD-10-CM | POA: Insufficient documentation

## 2013-10-09 DIAGNOSIS — G8929 Other chronic pain: Secondary | ICD-10-CM | POA: Insufficient documentation

## 2013-10-09 DIAGNOSIS — M549 Dorsalgia, unspecified: Secondary | ICD-10-CM

## 2013-10-09 DIAGNOSIS — Z23 Encounter for immunization: Secondary | ICD-10-CM | POA: Insufficient documentation

## 2013-10-09 MED ORDER — CYCLOBENZAPRINE HCL 10 MG PO TABS: 10.0000 mg | ORAL_TABLET | Freq: Three times a day (TID) | ORAL | Status: DC

## 2013-10-09 MED ORDER — TRAMADOL HCL 50 MG PO TABS: 50.0000 mg | ORAL_TABLET | Freq: Three times a day (TID) | ORAL | Status: DC | PRN

## 2013-10-09 NOTE — Progress Notes (Signed)
Patient presents for Chronic low back pain Denies pain at present States waking every morning with pain rating 10/10 Educated patient on lying with legs elevated on pillow to relieve pressure on lower back Also discussed icing low back at bedtime Patient refused flu vaccine

## 2013-10-09 NOTE — Progress Notes (Signed)
Patient ID: Cheryl King, female   DOB: [birth year], 48 y.o.   MRN: [id number]  CC:  Chronic back pain  HPI:  Patient presents to clinic today with persistent lower back pain.  She has had a history of low back pain with right sided sciatica. Has been taking 20 mg of flexeril three times per day.  Increased pain in buttocks and back of legs.  The right side is more painful.  Denies bowel or bladder dysfunction. Patient reports that she has a disablility hearing soon due to the inability to work from chronic back pain.  She also notes a lump behind her ears for the past week.  She notes that it is non tender and was larger last week.  She denies any exposure to sick persons. Denies fevers, chills, ear pain, scalp infections.     Allergies  Allergen Reactions  . Percocet [Oxycodone-Acetaminophen] Nausea Only  . Vicodin [Hydrocodone-Acetaminophen] Nausea Only  . Penicillins Rash   Past Medical History  Diagnosis Date  . Low back pain   . Sacroiliac inflammation   . Seasonal allergies   . Diverticulitis   . Sciatica    Current Outpatient Prescriptions on File Prior to Visit  Medication Sig Dispense Refill  . atorvastatin (LIPITOR) 20 MG tablet Take 1 tablet (20 mg total) by mouth daily.  30 tablet  3  . cyclobenzaprine (FLEXERIL) 10 MG tablet Take 1 tablet (10 mg total) by mouth 3 (three) times daily.  90 tablet  2  . ibuprofen (ADVIL,MOTRIN) 800 MG tablet TAKE 1 TABLET BY MOUTH EVERY 8 HOURS AS NEEDED.  60 tablet  1  . Lactobacillus CAPS Take 1 capsule by mouth daily.      . Multiple Vitamin (MULTIVITAMIN WITH MINERALS) TABS tablet Take 1 tablet by mouth daily.      Marland Kitchen omeprazole (PRILOSEC) 20 MG capsule Take 1 capsule (20 mg total) by mouth daily.  30 capsule  6  . OVER THE COUNTER MEDICATION Take 1 tablet by mouth 2 (two) times daily. Hair, Skin, and Nails vitamin      . OVER THE COUNTER MEDICATION Take 2 tablets by mouth every 4 (four) hours. Allergy medication      . Pyridoxine HCl  (VITAMIN B-6 PO) Take 1 tablet by mouth every other day.      . traMADol (ULTRAM) 50 MG tablet Take 1 tablet (50 mg total) by mouth every 8 (eight) hours as needed.  45 tablet  0  . diazepam (VALIUM) 2 MG tablet Take 1 tablet (2 mg total) by mouth every 8 (eight) hours as needed for anxiety or muscle spasms.  30 tablet  0  . metroNIDAZOLE (FLAGYL) 500 MG tablet Take all 4 tablets at 1 time  4 tablet  0  . metroNIDAZOLE (METROGEL) 0.75 % vaginal gel Place 1 Applicatorful vaginally at bedtime. To be used for seven days  70 g  0  . predniSONE (DELTASONE) 20 MG tablet Take 3 daily for 5 days, 2 daily for 5 days, 1 daily for 5 days.  30 tablet  0   No current facility-administered medications on file prior to visit.   Family History  Problem Relation Age of Onset  . Asthma Maternal Aunt   . Diabetes Maternal Aunt   . Cancer Maternal Aunt   . Hypertension Maternal Grandmother   . Colon cancer Neg Hx   . Pancreatic cancer Neg Hx   . Rectal cancer Neg Hx   . Stomach cancer Neg Hx  History   Social History  . Marital Status: Single    Spouse Name: N/A    Number of Children: N/A  . Years of Education: N/A   Occupational History  . Not on file.   Social History Main Topics  . Smoking status: Current Every Day Smoker -- 0.50 packs/day for 35 years  . Smokeless tobacco: Never Used  . Alcohol Use: No  . Drug Use: No  . Sexual Activity: No   Other Topics Concern  . Not on file   Social History Narrative  . No narrative on file    Review of Systems: See HPI.    Objective:   Filed Vitals:   10/09/13 1644  BP: 92/59  Pulse: 87  Temp: 97.8 F (36.6 C)  Resp: 18  Physical Exam  Neck: Normal range of motion. Neck supple.  Cardiovascular: Normal rate, normal heart sounds and intact distal pulses.   Pulmonary/Chest: Effort normal and breath sounds normal. No respiratory distress.  Abdominal: Soft. Bowel sounds are normal. She exhibits no distension.  Musculoskeletal: Normal  range of motion. She exhibits no edema and no tenderness.  Lymphadenopathy:    She has cervical adenopathy (left/right posterior cervical node enlargement\).     Lab Results  Component Value Date   WBC 9.3 04/13/2013   HGB 15.9* 04/13/2013   HCT 46.0 04/13/2013   MCV 93.1 04/13/2013   PLT 226 04/13/2013   Lab Results  Component Value Date   CREATININE 0.74 04/13/2013   BUN 18 04/13/2013   NA 137 04/13/2013   K 4.3 04/13/2013   CL 101 04/13/2013   CO2 29 04/13/2013    No results found for this basename: HGBA1C   Lipid Panel     Component Value Date/Time   CHOL 285* 04/13/2013 1152   TRIG 111 04/13/2013 1152   HDL 60 04/13/2013 1152   CHOLHDL 4.8 04/13/2013 1152   VLDL 22 04/13/2013 1152   LDLCALC 203* 04/13/2013 1152       Assessment and plan:   Cheryl King was seen today for follow-up and back pain.  Diagnoses and associated orders for this visit:  Enlarged lymph nodes - US Soft Tissue Head/Neck; Future  Chronic lower back pain - cyclobenzaprine (FLEXERIL) 10 MG tablet; Take 1 tablet (10 mg total) by mouth 3 (three) times daily. Stressed that she should only be taking 10 mg  Chronic neck and back pain - traMADol (ULTRAM) 50 MG tablet; Take 1 tablet (50 mg total) by mouth every 8 (eight) hours as needed.  Need for prophylactic vaccination and inoculation against influenza Received  - Flu Vaccine QUAD 36+ mos PF IM (Fluarix Quad PF)   Return if symptoms worsen or fail to improve.        Chari Manning, [facility] [phone] 10/16/2013, 8:56 AM

## 2013-10-09 NOTE — Patient Instructions (Signed)
Smoking Cessation Quitting smoking is important to your health and has many advantages. However, it is not always easy to quit since nicotine is a very addictive drug. Oftentimes, people try 3 times or more before being able to quit. This document explains the best ways for you to prepare to quit smoking. Quitting takes hard work and a lot of effort, but you can do it. ADVANTAGES OF QUITTING SMOKING  You will live longer, feel better, and live better.  Your body will feel the impact of quitting smoking almost immediately.  Within 20 minutes, blood pressure decreases. Your pulse returns to its normal level.  After 8 hours, carbon monoxide levels in the blood return to normal. Your oxygen level increases.  After 24 hours, the chance of having a heart attack starts to decrease. Your breath, hair, and body stop smelling like smoke.  After 48 hours, damaged nerve endings begin to recover. Your sense of taste and smell improve.  After 72 hours, the body is virtually free of nicotine. Your bronchial tubes relax and breathing becomes easier.  After 2 to 12 weeks, lungs can hold more air. Exercise becomes easier and circulation improves.  The risk of having a heart attack, stroke, cancer, or lung disease is greatly reduced.  After 1 year, the risk of coronary heart disease is cut in half.  After 5 years, the risk of stroke falls to the same as a nonsmoker.  After 10 years, the risk of lung cancer is cut in half and the risk of other cancers decreases significantly.  After 15 years, the risk of coronary heart disease drops, usually to the level of a nonsmoker.  If you are pregnant, quitting smoking will improve your chances of having a healthy baby.  The people you live with, especially any children, will be healthier.  You will have extra money to spend on things other than cigarettes. QUESTIONS TO THINK ABOUT BEFORE ATTEMPTING TO QUIT You may want to talk about your answers with your  health care provider.  Why do you want to quit?  If you tried to quit in the past, what helped and what did not?  What will be the most difficult situations for you after you quit? How will you plan to handle them?  Who can help you through the tough times? Your family? Friends? A health care provider?  What pleasures do you get from smoking? What ways can you still get pleasure if you quit? Here are some questions to ask your health care provider:  How can you help me to be successful at quitting?  What medicine do you think would be best for me and how should I take it?  What should I do if I need more help?  What is smoking withdrawal like? How can I get information on withdrawal? GET READY  Set a quit date.  Change your environment by getting rid of all cigarettes, ashtrays, matches, and lighters in your home, car, or work. Do not let people smoke in your home.  Review your past attempts to quit. Think about what worked and what did not. GET SUPPORT AND ENCOURAGEMENT You have a better chance of being successful if you have help. You can get support in many ways.  Tell your family, friends, and coworkers that you are going to quit and need their support. Ask them not to smoke around you.  Get individual, group, or telephone counseling and support. Programs are available at local hospitals and health centers. Call   your local health department for information about programs in your area.  Spiritual beliefs and practices may help some smokers quit.  Download a "quit meter" on your computer to keep track of quit statistics, such as how long you have gone without smoking, cigarettes not smoked, and money saved.  Get a self-help book about quitting smoking and staying off tobacco. LEARN NEW SKILLS AND BEHAVIORS  Distract yourself from urges to smoke. Talk to someone, go for a walk, or occupy your time with a task.  Change your normal routine. Take a different route to work.  Drink tea instead of coffee. Eat breakfast in a different place.  Reduce your stress. Take a hot bath, exercise, or read a book.  Plan something enjoyable to do every day. Reward yourself for not smoking.  Explore interactive web-based programs that specialize in helping you quit. GET MEDICINE AND USE IT CORRECTLY Medicines can help you stop smoking and decrease the urge to smoke. Combining medicine with the above behavioral methods and support can greatly increase your chances of successfully quitting smoking.  Nicotine replacement therapy helps deliver nicotine to your body without the negative effects and risks of smoking. Nicotine replacement therapy includes nicotine gum, lozenges, inhalers, nasal sprays, and skin patches. Some may be available over-the-counter and others require a prescription.  Antidepressant medicine helps people abstain from smoking, but how this works is unknown. This medicine is available by prescription.  Nicotinic receptor partial agonist medicine simulates the effect of nicotine in your brain. This medicine is available by prescription. Ask your health care provider for advice about which medicines to use and how to use them based on your health history. Your health care provider will tell you what side effects to look out for if you choose to be on a medicine or therapy. Carefully read the information on the package. Do not use any other product containing nicotine while using a nicotine replacement product.  RELAPSE OR DIFFICULT SITUATIONS Most relapses occur within the first 3 months after quitting. Do not be discouraged if you start smoking again. Remember, most people try several times before finally quitting. You may have symptoms of withdrawal because your body is used to nicotine. You may crave cigarettes, be irritable, feel very hungry, cough often, get headaches, or have difficulty concentrating. The withdrawal symptoms are only temporary. They are strongest  when you first quit, but they will go away within 10-14 days. To reduce the chances of relapse, try to:  Avoid drinking alcohol. Drinking lowers your chances of successfully quitting.  Reduce the amount of caffeine you consume. Once you quit smoking, the amount of caffeine in your body increases and can give you symptoms, such as a rapid heartbeat, sweating, and anxiety.  Avoid smokers because they can make you want to smoke.  Do not let weight gain distract you. Many smokers will gain weight when they quit, usually less than 10 pounds. Eat a healthy diet and stay active. You can always lose the weight gained after you quit.  Find ways to improve your mood other than smoking. FOR MORE INFORMATION  www.smokefree.gov  Document Released: 12/29/2000 Document Revised: 05/21/2013 Document Reviewed: 04/15/2011 [employer] Patient Information 2015 [employer]. This information is not intended to replace advice given to you by your health care provider. Make sure you discuss any questions you have with your health care provider.  

## 2013-10-10 ENCOUNTER — Telehealth: Payer: Self-pay | Admitting: Emergency Medicine

## 2013-10-10 NOTE — Telephone Encounter (Signed)
Pt given scheduled U/S appointment @ [facility] hospital 10/12/13 @ 8am

## 2013-10-12 ENCOUNTER — Ambulatory Visit (HOSPITAL_COMMUNITY)
Admission: RE | Admit: 2013-10-12 | Discharge: 2013-10-12 | Disposition: A | Discharge status: Home / Self Care | Payer: Self-pay | Source: Ambulatory Visit | Attending: Internal Medicine | Admitting: Internal Medicine

## 2013-10-12 DIAGNOSIS — R221 Localized swelling, mass and lump, neck: Secondary | ICD-10-CM

## 2013-10-12 DIAGNOSIS — R599 Enlarged lymph nodes, unspecified: Secondary | ICD-10-CM | POA: Insufficient documentation

## 2013-10-12 DIAGNOSIS — R220 Localized swelling, mass and lump, head: Secondary | ICD-10-CM | POA: Insufficient documentation

## 2013-10-15 ENCOUNTER — Ambulatory Visit: Payer: Self-pay

## 2013-10-17 ENCOUNTER — Ambulatory Visit: Payer: Self-pay | Attending: Internal Medicine

## 2013-10-17 NOTE — Progress Notes (Unsigned)
Pt comes in today for bilat ear irrigation s/p clogged ears on last exam Denies dizziness or difficulty hearing Both ears impacted with wax. No inflammation noted

## 2013-10-17 NOTE — Patient Instructions (Signed)
Both ears irrigated with water/peroxide solution  Clear bilaterally Pt also given U/S results per provider

## 2013-12-04 ENCOUNTER — Other Ambulatory Visit: Payer: Self-pay | Admitting: Internal Medicine

## 2013-12-26 ENCOUNTER — Ambulatory Visit (HOSPITAL_COMMUNITY)
Admission: RE | Admit: 2013-12-26 | Discharge: 2013-12-26 | Disposition: A | Discharge status: Home / Self Care | Payer: Self-pay | Source: Ambulatory Visit | Attending: Internal Medicine | Admitting: Internal Medicine

## 2013-12-26 ENCOUNTER — Other Ambulatory Visit: Payer: Self-pay

## 2013-12-26 ENCOUNTER — Encounter: Payer: Self-pay | Admitting: Internal Medicine

## 2013-12-26 ENCOUNTER — Ambulatory Visit: Payer: Self-pay | Attending: Internal Medicine | Admitting: Internal Medicine

## 2013-12-26 ENCOUNTER — Other Ambulatory Visit: Payer: Self-pay | Admitting: Internal Medicine

## 2013-12-26 VITALS — BP 122/73 | HR 69 | Temp 98.0°F | Resp 16 | Ht 66.5 in | Wt 144.0 lb

## 2013-12-26 DIAGNOSIS — R1030 Lower abdominal pain, unspecified: Secondary | ICD-10-CM

## 2013-12-26 DIAGNOSIS — F17200 Nicotine dependence, unspecified, uncomplicated: Secondary | ICD-10-CM

## 2013-12-26 DIAGNOSIS — Z885 Allergy status to narcotic agent status: Secondary | ICD-10-CM | POA: Insufficient documentation

## 2013-12-26 DIAGNOSIS — Z79899 Other long term (current) drug therapy: Secondary | ICD-10-CM | POA: Insufficient documentation

## 2013-12-26 DIAGNOSIS — L309 Dermatitis, unspecified: Secondary | ICD-10-CM

## 2013-12-26 DIAGNOSIS — G8929 Other chronic pain: Secondary | ICD-10-CM

## 2013-12-26 DIAGNOSIS — Z880 Allergy status to penicillin: Secondary | ICD-10-CM | POA: Insufficient documentation

## 2013-12-26 DIAGNOSIS — R079 Chest pain, unspecified: Secondary | ICD-10-CM | POA: Insufficient documentation

## 2013-12-26 DIAGNOSIS — E785 Hyperlipidemia, unspecified: Secondary | ICD-10-CM

## 2013-12-26 DIAGNOSIS — M545 Low back pain, unspecified: Secondary | ICD-10-CM

## 2013-12-26 DIAGNOSIS — Z720 Tobacco use: Secondary | ICD-10-CM | POA: Insufficient documentation

## 2013-12-26 DIAGNOSIS — Z791 Long term (current) use of non-steroidal anti-inflammatories (NSAID): Secondary | ICD-10-CM | POA: Insufficient documentation

## 2013-12-26 DIAGNOSIS — F17210 Nicotine dependence, cigarettes, uncomplicated: Secondary | ICD-10-CM | POA: Insufficient documentation

## 2013-12-26 LAB — POCT URINALYSIS DIPSTICK
BILIRUBIN UA: NEGATIVE
Blood, UA: NEGATIVE
Glucose, UA: NEGATIVE
Ketones, UA: NEGATIVE
Leukocytes, UA: NEGATIVE
NITRITE UA: NEGATIVE
PROTEIN UA: NEGATIVE
SPEC GRAV UA: 1.02
Urobilinogen, UA: 0.2
pH, UA: 7

## 2013-12-26 LAB — LIPID PANEL
CHOL/HDL RATIO: 3.2 ratio
Cholesterol: 194 mg/dL (ref 0–200)
HDL: 60 mg/dL (ref >39)
LDL Cholesterol: 116 mg/dL — ABNORMAL HIGH (ref 0–99)
TRIGLYCERIDES: 89 mg/dL (ref <150)
VLDL: 18 mg/dL (ref 0–40)

## 2013-12-26 MED ORDER — CYCLOBENZAPRINE HCL 10 MG PO TABS: 10.0000 mg | ORAL_TABLET | Freq: Three times a day (TID) | ORAL | Status: DC

## 2013-12-26 MED ORDER — ATORVASTATIN CALCIUM 20 MG PO TABS: 20.0000 mg | ORAL_TABLET | Freq: Every day | ORAL | Status: DC

## 2013-12-26 MED ORDER — HYDROCORTISONE 2.5 % EX CREA: TOPICAL_CREAM | Freq: Two times a day (BID) | CUTANEOUS | Status: DC

## 2013-12-26 NOTE — Progress Notes (Signed)
Pt is here b/c she was having lower abdominal pain. Pt states that she woke up this morning with some slight chest pain.

## 2013-12-26 NOTE — Progress Notes (Signed)
Patient ID: Cheryl King, female   DOB: 1965-02-15, 48 y.o.   MRN: [id number]  CC: abdominal pain/chest pain  HPI:  Patient presents to clinic today for evaluation of chest pain and abdominal pain.  She states that she woke up this morning with some slight chest discomfort described as muscle soreness.  She states that the pain has resolved.   Patient c/o of abdominal pain does been present since Sunday.  Pain is located in the lower pelvic region described as a dull intermittent pain.  Pain usually last about 15 minutes in which it comes and goes.  She notes that she has not ate today and also has not felt the pain today.  Allergies  Allergen Reactions  . Percocet [Oxycodone-Acetaminophen] Nausea Only  . Vicodin [Hydrocodone-Acetaminophen] Nausea Only  . Penicillins Rash   Past Medical History  Diagnosis Date  . Low back pain   . Sacroiliac inflammation   . Seasonal allergies   . Diverticulitis   . Sciatica    Current Outpatient Prescriptions on File Prior to Visit  Medication Sig Dispense Refill  . atorvastatin (LIPITOR) 20 MG tablet Take 1 tablet (20 mg total) by mouth daily. 30 tablet 3  . cyclobenzaprine (FLEXERIL) 10 MG tablet Take 1 tablet (10 mg total) by mouth 3 (three) times daily. 90 tablet 2  . ibuprofen (ADVIL,MOTRIN) 800 MG tablet TAKE 1 TABLET BY MOUTH EVERY 8 HOURS AS NEEDED. 60 tablet 1  . Lactobacillus CAPS Take 1 capsule by mouth daily.    . Multiple Vitamin (MULTIVITAMIN WITH MINERALS) TABS tablet Take 1 tablet by mouth daily.    Marland Kitchen omeprazole (PRILOSEC) 20 MG capsule Take 1 capsule (20 mg total) by mouth daily. 30 capsule 6  . OVER THE COUNTER MEDICATION Take 1 tablet by mouth 2 (two) times daily. Hair, Skin, and Nails vitamin    . OVER THE COUNTER MEDICATION Take 2 tablets by mouth every 4 (four) hours. Allergy medication    . diazepam (VALIUM) 2 MG tablet Take 1 tablet (2 mg total) by mouth every 8 (eight) hours as needed for anxiety or muscle spasms. (Patient  not taking: Reported on 12/26/2013) 30 tablet 0  . metroNIDAZOLE (FLAGYL) 500 MG tablet Take all 4 tablets at 1 time (Patient not taking: Reported on 12/26/2013) 4 tablet 0  . metroNIDAZOLE (METROGEL) 0.75 % vaginal gel Place 1 Applicatorful vaginally at bedtime. To be used for seven days (Patient not taking: Reported on 12/26/2013) 70 g 0  . predniSONE (DELTASONE) 20 MG tablet Take 3 daily for 5 days, 2 daily for 5 days, 1 daily for 5 days. (Patient not taking: Reported on 12/26/2013) 30 tablet 0  . Pyridoxine HCl (VITAMIN B-6 PO) Take 1 tablet by mouth every other day.    . traMADol (ULTRAM) 50 MG tablet Take 1 tablet (50 mg total) by mouth every 8 (eight) hours as needed. (Patient not taking: Reported on 12/26/2013) 45 tablet 0   No current facility-administered medications on file prior to visit.   Family History  Problem Relation Age of Onset  . Asthma Maternal Aunt   . Diabetes Maternal Aunt   . Cancer Maternal Aunt   . Hypertension Maternal Grandmother   . Colon cancer Neg Hx   . Pancreatic cancer Neg Hx   . Rectal cancer Neg Hx   . Stomach cancer Neg Hx    History   Social History  . Marital Status: Single    Spouse Name: N/A    Number of  Children: N/A  . Years of Education: N/A   Occupational History  . Not on file.   Social History Main Topics  . Smoking status: Current Every Day Smoker -- 0.50 packs/day for 35 years  . Smokeless tobacco: Never Used  . Alcohol Use: No  . Drug Use: No  . Sexual Activity: No   Other Topics Concern  . Not on file   Social History Narrative    Review of Systems  Respiratory: Negative.   Cardiovascular: Positive for chest pain. Negative for palpitations, claudication and leg swelling.  Gastrointestinal: Positive for abdominal pain. Negative for heartburn, nausea, vomiting, diarrhea and constipation.  Genitourinary: Negative for dysuria, urgency, frequency, hematuria and flank pain.       Denies vaginal discharge, itch, odor, lesions   Musculoskeletal: Positive for back pain.  All other systems reviewed and are negative.     Objective:   Filed Vitals:   12/26/13 1230  BP: 122/73  Pulse: 69  Temp: 98 F (36.7 C)  Resp: 16    Physical Exam  Cardiovascular: Normal rate, regular rhythm and normal heart sounds.   Pulmonary/Chest: Effort normal and breath sounds normal.  Abdominal: Soft. Bowel sounds are normal. She exhibits no distension. There is no tenderness. There is no rebound and no guarding.  Genitourinary: Uterus normal. Vaginal discharge (Thin white) found.  Musculoskeletal: She exhibits no edema or tenderness.     Lab Results  Component Value Date   WBC 9.3 04/13/2013   HGB 15.9* 04/13/2013   HCT 46.0 04/13/2013   MCV 93.1 04/13/2013   PLT 226 04/13/2013   Lab Results  Component Value Date   CREATININE 0.74 04/13/2013   BUN 18 04/13/2013   NA 137 04/13/2013   K 4.3 04/13/2013   CL 101 04/13/2013   CO2 29 04/13/2013    No results found for: HGBA1C Lipid Panel     Component Value Date/Time   CHOL 285* 04/13/2013 1152   TRIG 111 04/13/2013 1152   HDL 60 04/13/2013 1152   CHOLHDL 4.8 04/13/2013 1152   VLDL 22 04/13/2013 1152   LDLCALC 203* 04/13/2013 1152       Assessment and plan:   Cheryl King was seen today for follow-up.  Diagnoses and associated orders for this visit:  Chest pain, unspecified chest pain type EKG normal  Lower abdominal pain - POCT urinalysis dipstick - Cervicovaginal ancillary only  HLD (hyperlipidemia) - Lipid panel - Refill atorvastatin (LIPITOR) 20 MG tablet; Take 1 tablet (20 mg total) by mouth daily.  Chronic lower back pain - cyclobenzaprine (FLEXERIL) 10 MG tablet; Take 1 tablet (10 mg total) by mouth 3 (three) times daily.  Dermatitis - hydrocortisone 2.5 % cream; Apply topically 2 (two) times daily.  Smoking Smoking cessation discussed for 3 minutes, patient is not willing to quit at this time. Will continue to assess on each visit.  Discussed increased risk for diseases such as cancer, heart disease, and stroke.    Explained signs and symptoms that should warrant immediate attention.  Patient verbalized understanding with teach back used. Patient may follow-up as needed.        Cheryl Manning, NP-C [facility] [phone] 12/26/2013, 12:51 PM

## 2013-12-27 LAB — CERVICOVAGINAL ANCILLARY ONLY
Chlamydia: NEGATIVE
NEISSERIA GONORRHEA: NEGATIVE
WET PREP (BD AFFIRM): NEGATIVE
WET PREP (BD AFFIRM): NEGATIVE
Wet Prep (BD Affirm): POSITIVE — AB

## 2013-12-28 ENCOUNTER — Telehealth: Payer: Self-pay | Admitting: Internal Medicine

## 2013-12-28 ENCOUNTER — Telehealth: Payer: Self-pay | Admitting: Emergency Medicine

## 2013-12-28 ENCOUNTER — Other Ambulatory Visit: Payer: Self-pay | Admitting: Emergency Medicine

## 2013-12-28 MED ORDER — METRONIDAZOLE 0.75 % VA GEL: 1.0000 | Freq: Every day | VAGINAL | Status: DC

## 2013-12-28 NOTE — Telephone Encounter (Signed)
Pt came in today to request pap smear results/blood work Medication Flagyl gel with applicator x 7 day at bedtime Cholesterol level normal

## 2013-12-28 NOTE — Telephone Encounter (Signed)
Pt. Came into facility to request swab results. Please f/u with pt.

## 2014-01-02 ENCOUNTER — Telehealth: Payer: Self-pay | Admitting: Emergency Medicine

## 2014-01-02 MED ORDER — METRONIDAZOLE 0.75 % VA GEL: 1.0000 | Freq: Two times a day (BID) | VAGINAL | Status: DC

## 2014-01-02 NOTE — Telephone Encounter (Signed)
-----   Message from Lance Bosch, NP sent at 12/30/2013 10:28 PM EST ----- Patients cholesterol has improved significantly.  Please explain that she needs to continue medication use and continue to make dietary changes.  Her levels are still elevated.   Patient positive for Bacterial Vaginosis . Please explain this is not a STD, but a imbalance of the vaginal pH. Please send Metrogel use one applicatorful BID for 5 days. No refills, no alcohol while on this medication. Please explain that this is the probable reason for her abdominal pain

## 2014-01-02 NOTE — Telephone Encounter (Signed)
Left message for pt to call for lab results Medication Metrogel applicators BID x 5 dys e-scribed to [facility]

## 2014-01-30 ENCOUNTER — Emergency Department (HOSPITAL_COMMUNITY)
Admission: EM | Admit: 2014-01-30 | Discharge: 2014-01-30 | Disposition: A | Discharge status: Home / Self Care | Payer: Self-pay | Attending: Emergency Medicine | Admitting: Emergency Medicine

## 2014-01-30 ENCOUNTER — Encounter (HOSPITAL_COMMUNITY): Payer: Self-pay | Admitting: *Deleted

## 2014-01-30 DIAGNOSIS — Z9181 History of falling: Secondary | ICD-10-CM | POA: Insufficient documentation

## 2014-01-30 DIAGNOSIS — M25572 Pain in left ankle and joints of left foot: Secondary | ICD-10-CM | POA: Insufficient documentation

## 2014-01-30 DIAGNOSIS — K5792 Diverticulitis of intestine, part unspecified, without perforation or abscess without bleeding: Secondary | ICD-10-CM | POA: Insufficient documentation

## 2014-01-30 DIAGNOSIS — G8929 Other chronic pain: Secondary | ICD-10-CM | POA: Insufficient documentation

## 2014-01-30 DIAGNOSIS — Z880 Allergy status to penicillin: Secondary | ICD-10-CM | POA: Insufficient documentation

## 2014-01-30 DIAGNOSIS — M25562 Pain in left knee: Secondary | ICD-10-CM | POA: Insufficient documentation

## 2014-01-30 DIAGNOSIS — Z7952 Long term (current) use of systemic steroids: Secondary | ICD-10-CM | POA: Insufficient documentation

## 2014-01-30 DIAGNOSIS — Z79899 Other long term (current) drug therapy: Secondary | ICD-10-CM | POA: Insufficient documentation

## 2014-01-30 DIAGNOSIS — Z720 Tobacco use: Secondary | ICD-10-CM | POA: Insufficient documentation

## 2014-01-30 MED ORDER — ONDANSETRON HCL 8 MG PO TABS: 8.0000 mg | ORAL_TABLET | Freq: Three times a day (TID) | ORAL | Status: DC | PRN

## 2014-01-30 NOTE — ED Notes (Signed)
Pt reports hx of fall in august that injured left knee and hx of left ankle injury, now has a constant pain to both.

## 2014-01-30 NOTE — Discharge Instructions (Signed)
Wear knee sleeve/compression and ankle ace wrap and Use crutches as needed for comfort. Heat and elevate knee and ankle throughout the day. Alternate between ibuprofen and your home tramadol with zofran as needed for pain (and nausea). Call orthopedic follow up today or tomorrow to schedule followup appointment for recheck of ongoing knee and ankle pain in one to two weeks that can be canceled with a 24-48 hour notice if complete resolution of pain. Return to the ER for changes or worsening symptoms.   Arthritis, Nonspecific Arthritis is inflammation of a joint. This usually means pain, redness, warmth or swelling are present. One or more joints may be involved. There are a number of types of arthritis. Your caregiver may not be able to tell what type of arthritis you have right away. CAUSES  The most common cause of arthritis is the wear and tear on the joint (osteoarthritis). This causes damage to the cartilage, which can break down over time. The knees, hips, back and neck are most often affected by this type of arthritis. Other types of arthritis and common causes of joint pain include:  Sprains and other injuries near the joint. Sometimes minor sprains and injuries cause pain and swelling that develop hours later.  Rheumatoid arthritis. This affects hands, feet and knees. It usually affects both sides of your body at the same time. It is often associated with chronic ailments, fever, weight loss and general weakness.  Crystal arthritis. Gout and pseudo gout can cause occasional acute severe pain, redness and swelling in the foot, ankle, or knee.  Infectious arthritis. Bacteria can get into a joint through a break in overlying skin. This can cause infection of the joint. Bacteria and viruses can also spread through the blood and affect your joints.  Drug, infectious and allergy reactions. Sometimes joints can become mildly painful and slightly swollen with these types of illnesses. SYMPTOMS    Pain is the main symptom.  Your joint or joints can also be red, swollen and warm or hot to the touch.  You may have a fever with certain types of arthritis, or even feel overall ill.  The joint with arthritis will hurt with movement. Stiffness is present with some types of arthritis. DIAGNOSIS  Your caregiver will suspect arthritis based on your description of your symptoms and on your exam. Testing may be needed to find the type of arthritis:  Blood and sometimes urine tests.  X-ray tests and sometimes CT or MRI scans.  Removal of fluid from the joint (arthrocentesis) is done to check for bacteria, crystals or other causes. Your caregiver (or a specialist) will numb the area over the joint with a local anesthetic, and use a needle to remove joint fluid for examination. This procedure is only minimally uncomfortable.  Even with these tests, your caregiver may not be able to tell what kind of arthritis you have. Consultation with a specialist (rheumatologist) may be helpful. TREATMENT  Your caregiver will discuss with you treatment specific to your type of arthritis. If the specific type cannot be determined, then the following general recommendations may apply. Treatment of severe joint pain includes:  Rest.  Elevation.  Anti-inflammatory medication (for example, ibuprofen) may be prescribed. Avoiding activities that cause increased pain.  Only take over-the-counter or prescription medicines for pain and discomfort as recommended by your caregiver.  Cold packs over an inflamed joint may be used for 10 to 15 minutes every hour. Hot packs sometimes feel better, but do not use overnight. Do  not use hot packs if you are diabetic without your caregiver's permission.  A cortisone shot into arthritic joints may help reduce pain and swelling.  Any acute arthritis that gets worse over the next 1 to 2 days needs to be looked at to be sure there is no joint infection. Long-term arthritis  treatment involves modifying activities and lifestyle to reduce joint stress jarring. This can include weight loss. Also, exercise is needed to nourish the joint cartilage and remove waste. This helps keep the muscles around the joint strong. HOME CARE INSTRUCTIONS   Do not take aspirin to relieve pain if gout is suspected. This elevates uric acid levels.  Only take over-the-counter or prescription medicines for pain, discomfort or fever as directed by your caregiver.  Rest the joint as much as possible.  If your joint is swollen, keep it elevated.  Use crutches if the painful joint is in your leg.  Drinking plenty of fluids may help for certain types of arthritis.  Follow your caregiver's dietary instructions.  Try low-impact exercise such as:  Swimming.  Water aerobics.  Biking.  Walking.  Morning stiffness is often relieved by a warm shower.  Put your joints through regular range-of-motion. SEEK MEDICAL CARE IF:   You do not feel better in 24 hours or are getting worse.  You have side effects to medications, or are not getting better with treatment. SEEK IMMEDIATE MEDICAL CARE IF:   You have a fever.  You develop severe joint pain, swelling or redness.  Many joints are involved and become painful and swollen.  There is severe back pain and/or leg weakness.  You have loss of bowel or bladder control. Document Released: 02/12/2004 Document Revised: 03/29/2011 Document Reviewed: 02/28/2008 [facility] Patient Information 2015 [place], [place]. This information is not intended to replace advice given to you by your health care provider. Make sure you discuss any questions you have with your health care provider.  Heat Therapy Heat therapy can help make painful, stiff muscles and joints feel better. Do not use heat on new injuries. Wait at least 48 hours after an injury to use heat. Do not use heat when you have aches or pains right after an activity. If you still have  pain 3 hours after stopping the activity, then you may use heat. HOME CARE Wet heat pack  Soak a clean towel in warm water. Squeeze out the extra water.  Put the warm, wet towel in a plastic bag.  Place a thin, dry towel between your skin and the bag.  Put the heat pack on the area for 5 minutes, and check your skin. Your skin may be pink, but it should not be red.  Leave the heat pack on the area for 15 to 30 minutes.  Repeat this every 2 to 4 hours while awake. Do not use heat while you are sleeping. Warm water bath  Fill a tub with warm water.  Place the affected body part in the tub.  Soak the area for 20 to 40 minutes.  Repeat as needed. Hot water bottle  Fill the water bottle half full with hot water.  Press out the extra air. Close the cap tightly.  Place a dry towel between your skin and the bottle.  Put the bottle on the area for 5 minutes, and check your skin. Your skin may be pink, but it should not be red.  Leave the bottle on the area for 15 to 30 minutes.  Repeat this every  2 to 4 hours while awake. Electric heating pad  Place a dry towel between your skin and the heating pad.  Set the heating pad on low heat.  Put the heating pad on the area for 10 minutes, and check your skin. Your skin may be pink, but it should not be red.  Leave the heating pad on the area for 20 to 40 minutes.  Repeat this every 2 to 4 hours while awake.  Do not lie on the heating pad.  Do not fall asleep while using the heating pad.  Do not use the heating pad near water. GET HELP RIGHT AWAY IF:  You get blisters or red skin.  Your skin is puffy (swollen), or you lose feeling (numbness) in the affected area.  You have any new problems.  Your problems are getting worse.  You have any questions or concerns. If you have any problems, stop using heat therapy until you see your doctor. MAKE SURE YOU:  Understand these instructions.  Will watch your  condition.  Will get help right away if you are not doing well or get worse. Document Released: 03/29/2011 Document Reviewed: 02/27/2013 [facility] Patient Information 2015 [place]. This information is not intended to replace advice given to you by your health care provider. Make sure you discuss any questions you have with your health care provider.

## 2014-01-30 NOTE — ED Provider Notes (Signed)
CSN: [id number]     Arrival date & time 01/30/14  6160 History  This chart was scribed for non-physician practitioner, Zacarias Pontes, PA-C working with Fredia Sorrow, MD by Frederich Balding, ED scribe. This patient was seen in room TR06C/TR06C and the patient's care was started at 8:52 AM.   Chief Complaint  Patient presents with  . Knee Pain  . Ankle Pain   Patient is a 49 y.o. female presenting with knee pain and ankle pain. The history is provided by the patient. No language interpreter was used.  Knee Pain Location:  Knee Time since incident:  5 months Injury: yes   Mechanism of injury: fall   Knee location:  L knee Pain details:    Quality:  Throbbing   Radiates to:  Does not radiate   Severity:  Moderate   Onset quality:  Gradual   Duration:  5 months   Timing:  Intermittent Chronicity:  Recurrent Dislocation: no   Foreign body present:  No foreign bodies Prior injury to area:  Yes Relieved by:  Nothing Worsened by:  Bearing weight Ineffective treatments:  NSAIDs and muscle relaxant Associated symptoms: no fever   Ankle Pain Location:  Ankle Time since incident:  5 months Injury: yes   Mechanism of injury: fall   Ankle location:  L ankle Pain details:    Quality:  Throbbing   Radiates to:  Does not radiate   Severity:  Moderate   Onset quality:  Gradual   Duration:  5 months   Timing:  Constant Chronicity:  Chronic Dislocation: no   Foreign body present:  No foreign bodies Prior injury to area:  Yes Relieved by:  Nothing Worsened by:  Bearing weight Ineffective treatments:  Muscle relaxant and NSAIDs Associated symptoms: no fever     HPI Comments: Cheryl King is a 49 y.o. female with a PMHx of chronic back pain/sciatica, who presents to the Emergency Department complaining of continued left knee and left ankle pain from a fall in August 2015. Reports history of ankle injury in the 1990s and states the fall exacerbated the pain. Reports that  since the weather changed recently ~1wk ago, her pain has increased. Rates pain 7/10. Ankle pain is constant and knee pain is intermittent. Describes pain as throbbing and states it doesn't radiate. Bearing weight worsens it. She has taken tramadol, ibuprofen and a muscle relaxer with no relief. States she doesn't take the tramadol very often because it makes her nauseous. Denies fever, chills, chest pain, SOB, abdominal pain, nausea, emesis, joint swelling or redness, numbness or tingling, weakness.   Past Medical History  Diagnosis Date  . Low back pain   . Sacroiliac inflammation   . Seasonal allergies   . Diverticulitis   . Sciatica    Past Surgical History  Procedure Laterality Date  . Ankle surgery      lt.  Marland Kitchen Uretherl      uretrral diverticultis  . Abdominal hysterectomy    . Abdominal surgery    . Right breast cyst removed      Family History  Problem Relation Age of Onset  . Asthma Maternal Aunt   . Diabetes Maternal Aunt   . Cancer Maternal Aunt   . Hypertension Maternal Grandmother   . Colon cancer Neg Hx   . Pancreatic cancer Neg Hx   . Rectal cancer Neg Hx   . Stomach cancer Neg Hx    History  Substance Use Topics  . Smoking status: Current Every  Day Smoker -- 0.50 packs/day for 35 years  . Smokeless tobacco: Never Used  . Alcohol Use: No   OB History    No data available     Review of Systems  Constitutional: Negative for fever and chills.  Respiratory: Negative for shortness of breath.   Cardiovascular: Negative for chest pain.  Gastrointestinal: Negative for nausea, vomiting and abdominal pain.  Musculoskeletal: Positive for arthralgias. Negative for joint swelling.  Skin: Negative for color change.  Neurological: Negative for weakness and numbness.  All other systems reviewed and are negative. 10 systems reviewed and are negative for acute changes except as noted in the HPI.  Allergies  Percocet; Vicodin; and Penicillins  Home Medications    Prior to Admission medications   Medication Sig Start Date End Date Taking? Authorizing Provider  atorvastatin (LIPITOR) 20 MG tablet Take 1 tablet (20 mg total) by mouth daily. 12/26/13   Lance Bosch, NP  cyclobenzaprine (FLEXERIL) 10 MG tablet Take 1 tablet (10 mg total) by mouth 3 (three) times daily. 12/26/13   Lance Bosch, NP  diazepam (VALIUM) 2 MG tablet Take 1 tablet (2 mg total) by mouth every 8 (eight) hours as needed for anxiety or muscle spasms. Patient not taking: Reported on 12/26/2013 07/23/13   Harden Mo, MD  hydrocortisone 2.5 % cream Apply topically 2 (two) times daily. 12/26/13   Lance Bosch, NP  ibuprofen (ADVIL,MOTRIN) 800 MG tablet TAKE 1 TABLET BY MOUTH EVERY 8 HOURS AS NEEDED. 10/04/13   Lorayne Marek, MD  Lactobacillus CAPS Take 1 capsule by mouth daily.    Historical Provider, MD  metroNIDAZOLE (FLAGYL) 500 MG tablet Take all 4 tablets at 1 time Patient not taking: Reported on 12/26/2013 07/23/13   Harden Mo, MD  metroNIDAZOLE (METROGEL) 0.75 % vaginal gel Place 1 Applicatorful vaginally 2 (two) times daily. To be used for five days 01/02/14   Lance Bosch, NP  Multiple Vitamin (MULTIVITAMIN WITH MINERALS) TABS tablet Take 1 tablet by mouth daily.    Historical Provider, MD  omeprazole (PRILOSEC) 20 MG capsule Take 1 capsule (20 mg total) by mouth daily. 08/27/13   Lance Bosch, NP  OVER THE COUNTER MEDICATION Take 1 tablet by mouth 2 (two) times daily. Hair, Skin, and Nails vitamin    Historical Provider, MD  OVER THE COUNTER MEDICATION Take 2 tablets by mouth every 4 (four) hours. Allergy medication    Historical Provider, MD  predniSONE (DELTASONE) 20 MG tablet Take 3 daily for 5 days, 2 daily for 5 days, 1 daily for 5 days. Patient not taking: Reported on 12/26/2013 07/23/13   Harden Mo, MD  Pyridoxine HCl (VITAMIN B-6 PO) Take 1 tablet by mouth every other day.    Historical Provider, MD  traMADol (ULTRAM) 50 MG tablet Take 1 tablet (50 mg total) by  mouth every 8 (eight) hours as needed. Patient not taking: Reported on 12/26/2013 10/09/13   Lance Bosch, NP   BP 118/73 mmHg  Pulse 76  Temp(Src) 97.9 F (36.6 C) (Oral)  Resp 16  SpO2 98%   Physical Exam  Constitutional: She is oriented to person, place, and time. Vital signs are normal. She appears well-developed and well-nourished.  Non-toxic appearance. No distress.  Afebrile, non toxic, NAD  HENT:  Head: Normocephalic and atraumatic.  Mouth/Throat: Mucous membranes are normal.  Eyes: Conjunctivae and EOM are normal.  Neck: Normal range of motion. Neck supple.  Cardiovascular: Normal rate and intact distal  pulses.   Distal pulses intact  Pulmonary/Chest: Effort normal. No respiratory distress.  Abdominal: Normal appearance. She exhibits no distension.  Musculoskeletal: Normal range of motion.       Left knee: Normal.       Left ankle: Normal. Achilles tendon normal.  Left knee with FROM intact, no swelling or crepitus, no erythema or warmth, no bony tenderness or deformity, negative anterior drawer test. No varus valgus laxity. Left ankle with FROM intact, no swelling or crepitus, no bony tenderness or deformity. Strength 5/5 in all extremities, sensation grossly intact in all extremities, distal pulses intact  Neurological: She is alert and oriented to person, place, and time. She has normal strength. No sensory deficit.  Skin: Skin is warm, dry and intact. No erythema.  Psychiatric: She has a normal mood and affect. Her behavior is normal.  Nursing note and vitals reviewed.   ED Course  Procedures (including critical care time)  DIAGNOSTIC STUDIES: Oxygen Saturation is 98% on RA, normal by my interpretation.    COORDINATION OF CARE: 8:59 AM-Discussed treatment plan which includes crutches, zofran and orthopedic referral with pt at bedside and pt agreed to plan. Return precautions given.   Labs Review Labs Reviewed - No data to display  Imaging Review No results  found.   EKG Interpretation None      MDM   Final diagnoses:  Chronic knee pain, left  Chronic ankle pain, left    49 y.o. female with chronic L ankle and knee pain, no acute injury. History c/w arthritis, given that pain increased after cold weather change. No bony abnormalities or TTP, doubt need for imaging. Neurovascularly intact with soft compartments. Neg homan's bilaterally, doubt DVT. Pt requesting crutches. Will refer to ortho for ongoing management of chronic arthritis. Pt takes tramadol at home but states she gets nausea, allergic to percocet and vicodin. Will give her zofran for home. I explained the diagnosis and have given explicit precautions to return to the ER including for any other new or worsening symptoms. The patient understands and accepts the medical plan as it's been dictated and I have answered their questions. Discharge instructions concerning home care and prescriptions have been given. The patient is STABLE and is discharged to home in good condition.  BP 118/73 mmHg  Pulse 76  Temp(Src) 97.9 F (36.6 C) (Oral)  Resp 16  SpO2 98%  Meds ordered this encounter  Medications  . ondansetron (ZOFRAN) 8 MG tablet    Sig: Take 1 tablet (8 mg total) by mouth every 8 (eight) hours as needed for nausea or vomiting.    Dispense:  10 tablet    Refill:  0    Order Specific Question:  Supervising Provider    Answer:  Noemi Chapel D [[id number]]     I personally performed the services described in this documentation, which was scribed in my presence. The recorded information has been reviewed and is accurate.  Patty Sermons [place], [place] 01/30/14 [place], MD 01/30/14 [phone]

## 2014-02-13 ENCOUNTER — Ambulatory Visit: Payer: Self-pay | Attending: Internal Medicine

## 2014-02-26 ENCOUNTER — Telehealth: Payer: Self-pay | Admitting: Emergency Medicine

## 2014-02-26 ENCOUNTER — Other Ambulatory Visit: Payer: Self-pay | Admitting: Internal Medicine

## 2014-02-26 MED ORDER — AMITRIPTYLINE HCL 25 MG PO TABS: 25.0000 mg | ORAL_TABLET | Freq: Every day | ORAL | Status: DC

## 2014-02-26 NOTE — Telephone Encounter (Signed)
Medication has been sent to our pharmacy.

## 2014-02-26 NOTE — Telephone Encounter (Signed)
Pt requesting refill Elavil for migraine headaches States she was prescribed medication at Urgent Care in the past  She will use [facility]

## 2014-02-27 NOTE — Telephone Encounter (Signed)
medication at [facility], Pt aware

## 2014-05-06 ENCOUNTER — Ambulatory Visit: Payer: No Typology Code available for payment source | Attending: Specialist | Admitting: Physical Therapy

## 2014-05-06 DIAGNOSIS — R29898 Other symptoms and signs involving the musculoskeletal system: Secondary | ICD-10-CM

## 2014-05-06 DIAGNOSIS — M25611 Stiffness of right shoulder, not elsewhere classified: Secondary | ICD-10-CM

## 2014-05-06 DIAGNOSIS — M436 Torticollis: Secondary | ICD-10-CM

## 2014-05-06 DIAGNOSIS — M542 Cervicalgia: Secondary | ICD-10-CM

## 2014-05-06 DIAGNOSIS — M7581 Other shoulder lesions, right shoulder: Secondary | ICD-10-CM | POA: Insufficient documentation

## 2014-05-06 NOTE — Patient Instructions (Signed)
   Darrly Loberg PT, DPT, LAT, ATC  [facility] Phone: [phone]     

## 2014-05-06 NOTE — Therapy (Signed)
[place], [place], [address] Phone: [phone]   Fax:  [phone]  Physical Therapy Evaluation  Patient Details  Name: Cheryl King MRN: [id number] Date of Birth: Sep 09, 1965 Referring Provider:  Jessy Oto, MD  Encounter Date: 05/06/2014      PT End of Session - 05/06/14 1224    Visit Number 1   Number of Visits 18   Date for PT Re-Evaluation 07/06/14   PT Start Time 0845   PT Stop Time 0930   PT Time Calculation (min) 45 min   Activity Tolerance Patient tolerated treatment well;Patient limited by pain   Behavior During Therapy [facility] for tasks assessed/performed      Past Medical History  Diagnosis Date  . Low back pain   . Sacroiliac inflammation   . Seasonal allergies   . Diverticulitis   . Sciatica     Past Surgical History  Procedure Laterality Date  . Ankle surgery      lt.  Marland Kitchen Uretherl      uretrral diverticultis  . Abdominal hysterectomy    . Abdominal surgery    . Right breast cyst removed       There were no vitals filed for this visit.  Visit Diagnosis:  Neck pain - Plan: PT plan of care cert/re-cert  Decreased ROM of neck - Plan: PT plan of care cert/re-cert  Neck stiffness - Plan: PT plan of care cert/re-cert  Shoulder weakness - Plan: PT plan of care cert/re-cert  Decreased shoulder mobility, right - Plan: PT plan of care cert/re-cert      Subjective Assessment - 05/06/14 0852    Subjective pt is a 49 y.o. F with CC of neck and back pain that started in 2009 that started insidious. She reported that the symptoms have gotten worse since onset.    Limitations Sitting;Standing;Walking;Lifting;House hold activities   How long can you sit comfortably? 15-20 min   How long can you stand comfortably? 15-53min   How long can you walk comfortably? 10 min   Diagnostic tests Neck 2015 Athritis/ bone spurs per pt report   Patient Stated Goals To be pain free   Currently in Pain? Yes   Pain Score 8    Pain Location Neck   Pain Orientation Posterior;Mid;Lower   Pain Descriptors / Indicators Aching   Pain Type Chronic pain   Pain Radiating Towards into the R arm and 4th and 5th digits of the r hand   Pain Onset More than a month ago   Pain Frequency Intermittent   Aggravating Factors  unsure of what sets it off "it just comes on its own"   Pain Relieving Factors rubbing the arm.    Effect of Pain on Daily Activities limited endurance   Multiple Pain Sites Yes   Pain Score 8   Pain Location Back   Pain Orientation Mid;Lower   Pain Descriptors / Indicators Aching   Pain Type Chronic pain   Pain Radiating Towards into both legs R>L into to foot   Pain Onset More than a month ago   Pain Frequency Constant   Aggravating Factors  unsure of what sets it off   Pain Relieving Factors tried ice,heat nothing works   Effect of Pain on Daily Activities limited endurance with standing and walking            [facility] PT Assessment - 05/06/14 0902    Assessment   Medical Diagnosis cervical spondylosis  Onset Date --  since 2009   Next MD Visit 06/13/2014   Prior Therapy N/A   Precautions   Precautions Cervical   Precaution Comments no reaching, looking up, no carrying weight over 5-10lbs.    Restrictions   Weight Bearing Restrictions No   Balance Screen   Has the patient fallen in the past 6 months No   Has the patient had a decrease in activity level because of a fear of falling?  No   Is the patient reluctant to leave their home because of a fear of falling?  No   Home Environment   Living Enviornment Private residence   Living Arrangements Alone   Type of [place] to enter   Entrance Stairs-Number of Steps 6   Entrance Stairs-Rails Can reach both   [place] One level   Home Equipment Crutches   Prior Function   Level of Independence Independent with basic ADLs;Independent with homemaking with  ambulation;Independent with gait;Independent with transfers   Vocation On disability  filed for it   Leisure dancing, braid hair   Cognition   Overall Cognitive Status Within Functional Limits for tasks assessed   Observation/Other Assessments   Focus on Therapeutic Outcomes (FOTO)  64% limitation   predicted to 47%   Sensation   Light Touch Appears Intact   Light Touch Impaired Details --  normal Dermatomal sensation equal bil   Posture/Postural Control   Posture/Postural Control Postural limitations   Postural Limitations Rounded Shoulders;Forward head   ROM / Strength   AROM / PROM / Strength AROM;PROM;Strength   AROM   AROM Assessment Site Cervical   Right/Left Shoulder Right   Right Shoulder Extension 40 Degrees   Right Shoulder Flexion 12 Degrees   Right Shoulder ABduction 122 Degrees   Right Shoulder Internal Rotation 68 Degrees   Right Shoulder External Rotation 78 Degrees   Cervical Flexion 40   Cervical Extension 34  increased pain   Cervical - Right Side Bend 26  increased pain   Cervical - Left Side Bend 28   Cervical - Right Rotation 35   Cervical - Left Rotation 22   Strength   Overall Strength --   Strength Assessment Site Shoulder   Right/Left Shoulder Right   Right Shoulder Flexion 3+/5   Right Shoulder Extension 4+/5   Right Shoulder ABduction 3+/5   Right Shoulder Internal Rotation 4-/5   Right Shoulder External Rotation 4-/5   Grip (lbs) grip strength 39# on R and 52lbs on L.   Palpation   Palpation Tenderness at the R upper trap and sub-occipitals, C3-C7 with hypomobile passive accessory movement.   Special Tests    Special Tests Cervical   Cervical Tests Spurling's;Dictraction;other;other2   Spurling's   Findings Positive   Side Right   Distraction Test   Findngs Positive   side Right   other    Findings --  ULTT ulnar nerve   Ambulation/Gait   Ambulation/Gait Yes   Gait Pattern Step-through pattern;Antalgic   Functional Gait   Assessment   Gait assessed  --                   [facility] Adult PT Treatment/Exercise - 05/06/14 0902    Neck Exercises: Seated   Other Seated Exercise 3 finger cervical rotation x 10   Neck Exercises: Supine   Other Supine Exercise Deep neck flexors x 10 with 5 sec hold   Manual Therapy   Manual  Therapy Other (comment)  neural flossing of the Ulnar nerve   Neck Exercises: Stretches   Upper Trapezius Stretch 2 reps;30 seconds   Upper Trapezius Stretch Limitations significant tightness                PT Education - 05/06/14 1223    Education provided Yes   Education Details evaluation findings, POC, Goals, HEP   Person(s) Educated Patient   Methods Explanation   Comprehension Verbalized understanding          PT Short Term Goals - 05/06/14 1254    PT SHORT TERM GOAL #1   Title pt will be I with Basic HEP 05/27/2014   Time 4   Period Weeks   Status New   PT SHORT TERM GOAL #2   Title she will increase Cervical rotation bil by > 10 degrees to assist with safety during driving 01/19/9415   Time 4   Period Weeks   Status New   PT SHORT TERM GOAL #3   Title pt will decreased neck pain to <5/10 to assist with functional exercise 05/27/2014   Time 4   Period Weeks   Status New   PT SHORT TERM GOAL #4   Title pt will increase R shoulder flexion/abduction to > 4-/5 to assist with ADL's 05/27/2014   Time 4   Period Weeks   Status New           PT Long Term Goals - 05/06/14 1254    PT LONG TERM GOAL #1   Title pt will be I with advanced HEP 07/06/2014   Time 8   Period Weeks   Status New   PT LONG TERM GOAL #2   Title pt will Increase overall cervical mobility by >20 degrees in all planes to assist with functional mobility and safety during driving 04/26/1446   Time 8   Period Weeks   Status New   PT LONG TERM GOAL #3   Title pt will decrease cervical pain to <3/10 to assist with ADLs, and functional exercises 07/06/2014   Time 8   Period Weeks   Status  New   PT LONG TERM GOAL #4   Title pt will increase R shoulder strength to >4+/5 to help with ADLs 07/06/2014   Time 8   Period Weeks   Status New   PT LONG TERM GOAL #5   Title pt will be able to verbalize and demonstrate techniques to reduce risk or cervical reinjury by postural awareness, and HEP   Time 8   Period Weeks   Status New               Plan - 05/06/14 1225    Clinical Impression Statement Warda presents to OPPT with CC of Neck pain. She demonstrates limited cervical mobility limited in all plans with report radiation pain and tingling into the arm and ulnar distribution of the R hand. R shoulder AROM is limited in all planes and demonstrates weakness in all planes with Flexion/abduction being the weakest 3+/5 compared Bil. she demonstrates a postive test cluster for cervical radiculopathy with positive testing of spurlings, distraction, ULTT and <60 degress of rotation. She would benefit from skilled physical therapy to improve her function by address  the impairments listed.    Rehab Potential Good   PT Frequency 2x / week   PT Duration 8 weeks   PT Treatment/Interventions ADLs/Self Care Home Management;Moist Heat;Therapeutic activities;Patient/family education;Passive range of motion;Therapeutic exercise;Ultrasound;Balance training;Manual techniques;Cryotherapy;Dry needling;Neuromuscular re-education;Electrical  Stimulation;Other (comment)  Cervical traction   PT Next Visit Plan assess response to HEP, Cervical mobilization/ traction, nueral flossing, Strengtheing of R shoulder   PT Home Exercise Plan 3 finger cervical rotation, upper trap stretch, Deep neck flexors   Consulted and Agree with Plan of Care Patient         Problem List Patient Active Problem List   Diagnosis Date Noted  . Smoking 12/26/2013  . Chronic lower back pain 04/13/2013  . Sciatica 04/13/2013  . Chronic neck pain 04/13/2013  . Spondylosis of cervical spine 04/13/2013   Starr Lake PT, DPT, LAT, ATC  05/06/2014  1:01 PM   [place] [facility] [address] [place], [place], [address] Phone: [phone]   Fax:  [phone]

## 2014-05-13 ENCOUNTER — Other Ambulatory Visit: Payer: Self-pay | Admitting: Internal Medicine

## 2014-05-17 ENCOUNTER — Ambulatory Visit: Payer: No Typology Code available for payment source | Admitting: Physical Therapy

## 2014-05-17 DIAGNOSIS — M25611 Stiffness of right shoulder, not elsewhere classified: Secondary | ICD-10-CM

## 2014-05-17 DIAGNOSIS — M436 Torticollis: Secondary | ICD-10-CM

## 2014-05-17 DIAGNOSIS — R29898 Other symptoms and signs involving the musculoskeletal system: Secondary | ICD-10-CM

## 2014-05-17 DIAGNOSIS — M542 Cervicalgia: Secondary | ICD-10-CM

## 2014-05-17 NOTE — Therapy (Addendum)
[place], [place], [address] Phone: [phone]   Fax:  [phone]  Physical Therapy Treatment  Patient Details  Name: Cheryl King MRN: [id number] Date of Birth: 1965-09-24 Referring Provider:  Lance Bosch, NP  Encounter Date: 05/17/2014      PT End of Session - 05/17/14 0836    Visit Number 2   Number of Visits 18   Date for PT Re-Evaluation 07/06/14   PT Start Time 7322   PT Stop Time 0932   PT Time Calculation (min) 54 min      Past Medical History  Diagnosis Date  . Low back pain   . Sacroiliac inflammation   . Seasonal allergies   . Diverticulitis   . Sciatica     Past Surgical History  Procedure Laterality Date  . Ankle surgery      lt.  Marland Kitchen Uretherl      uretrral diverticultis  . Abdominal hysterectomy    . Abdominal surgery    . Right breast cyst removed       There were no vitals filed for this visit.  Visit Diagnosis:  Neck pain  Decreased ROM of neck  Neck stiffness  Shoulder weakness  Decreased shoulder mobility, right      Subjective Assessment - 05/17/14 0837    Subjective She reports that she has been doing ok since the last but has been doing more house work and noticed that she has been sore from that.    Currently in Pain? Yes   Pain Score 7    Pain Location Neck   Pain Orientation Mid;Lower;Posterior   Pain Descriptors / Indicators Aching;Burning;Dull;Sharp;Shooting   Pain Type Chronic pain   Pain Onset More than a month ago   Pain Frequency Constant                         [facility] Adult PT Treatment/Exercise - 05/17/14 0849    Neck Exercises: Supine   Other Supine Exercise Deep neck flexors x 10 with 5 sec hold   Other Supine Exercise SNAGS 5 x 10 sec to the L and R   Modalities   Modalities Traction   Traction   Type of Traction Cervical  50% speed   Min (lbs) 0   Max (lbs) 15   Hold Time 1 min   Rest Time 1 min   Time 15   Manual Therapy   Manual Therapy Massage;Joint mobilization   Joint Mobilization cervical mobilizations grad 2-3 p/a and lateral (gapping)   Massage upper trap/ sub-occipital release/ trigger point release bil   Neck Exercises: Stretches   Upper Trapezius Stretch 2 reps;30 seconds   Levator Stretch 2 reps;30 seconds                PT Education - 05/17/14 0924    Education provided Yes   Education Details traction education, trigger point release education   Person(s) Educated Patient   Methods Explanation   Comprehension Verbalized understanding          PT Short Term Goals - 05/17/14 0928    PT SHORT TERM GOAL #1   Title pt will be I with Basic HEP 05/27/2014   Time 4   Period Weeks   Status On-going   PT SHORT TERM GOAL #2   Title she will increase Cervical rotation bil by > 10 degrees to assist with safety during driving 0/02/5425   Time  4   Period Weeks   Status On-going   PT SHORT TERM GOAL #3   Title pt will decreased neck pain to <5/10 to assist with functional exercise 05/27/2014   Time 4   Period Weeks   Status On-going   PT SHORT TERM GOAL #4   Title pt will increase R shoulder flexion/abduction to > 4-/5 to assist with ADL's 05/27/2014   Time 4   Period Weeks   Status On-going           PT Long Term Goals - 05/17/14 0928    PT LONG TERM GOAL #1   Title pt will be I with advanced HEP 07/06/2014   Time 8   Period Weeks   Status On-going   PT LONG TERM GOAL #2   Title pt will Increase overall cervical mobility by >20 degrees in all planes to assist with functional mobility and safety during driving 08/15/209   Time 8   Period Weeks   Status On-going   PT LONG TERM GOAL #3   Title pt will decrease cervical pain to <3/10 to assist with ADLs, and functional exercises 07/06/2014   Time 8   Period Weeks   Status On-going   PT LONG TERM GOAL #4   Title pt will increase R shoulder strength to >4+/5 to help with ADLs 07/06/2014   Time 8    Period Weeks   Status On-going   PT LONG TERM GOAL #5   Title pt will be able to verbalize and demonstrate techniques to reduce risk or cervical reinjury by postural awareness, and HEP   Time 8   Period Weeks   Status New               Plan - 05/17/14 0925    Clinical Impression Statement Kerryn presents to therapy today stating that she is feeling a little better since the last visit but has been doing more around the house and rates her pain prior to therapy at 7/10. PT performed Sub-occipital relase and bil upper trap manual trigger point release which the pt reported some relief.  Performed cervical traction today fo 15 min with max weight of 15# intermittently 42mn:1min. She reported pain relief of 4/10.  No goals met this visit.    PT Next Visit Plan assess response to cervical traciton, cervical mobiliation, nueral flossing, Shoulder strengtheing/scapular stabilization   Consulted and Agree with Plan of Care Patient        Problem List Patient Active Problem List   Diagnosis Date Noted  . Smoking 12/26/2013  . Chronic lower back pain 04/13/2013  . Sciatica 04/13/2013  . Chronic neck pain 04/13/2013  . Spondylosis of cervical spine 04/13/2013   KStarr LakePT, DPT, LAT, ATC  05/17/2014  10:14 AM   [facility] [address]Phone: [phone]  Fax:  [phone]

## 2014-05-20 ENCOUNTER — Ambulatory Visit: Payer: No Typology Code available for payment source | Attending: Specialist | Admitting: Physical Therapy

## 2014-05-20 DIAGNOSIS — R29898 Other symptoms and signs involving the musculoskeletal system: Secondary | ICD-10-CM | POA: Insufficient documentation

## 2014-05-20 DIAGNOSIS — M7581 Other shoulder lesions, right shoulder: Secondary | ICD-10-CM | POA: Insufficient documentation

## 2014-05-20 DIAGNOSIS — M542 Cervicalgia: Secondary | ICD-10-CM | POA: Insufficient documentation

## 2014-05-20 DIAGNOSIS — M436 Torticollis: Secondary | ICD-10-CM | POA: Insufficient documentation

## 2014-05-20 DIAGNOSIS — M25611 Stiffness of right shoulder, not elsewhere classified: Secondary | ICD-10-CM

## 2014-05-20 NOTE — Therapy (Signed)
[place], [place], [address] Phone: [phone]   Fax:  [phone]  Physical Therapy Treatment  Patient Details  Name: Cheryl King MRN: [id number] Date of Birth: [birth year] Referring Provider:  Lance Bosch, NP  Encounter Date: 05/20/2014      PT End of Session - 05/20/14 0933    Visit Number 3   Number of Visits 18   Date for PT Re-Evaluation 07/06/14   PT Start Time 0845   Activity Tolerance Patient tolerated treatment well   Behavior During Therapy [facility] for tasks assessed/performed      Past Medical History  Diagnosis Date  . Low back pain   . Sacroiliac inflammation   . Seasonal allergies   . Diverticulitis   . Sciatica     Past Surgical History  Procedure Laterality Date  . Ankle surgery      lt.  Marland Kitchen Uretherl      uretrral diverticultis  . Abdominal hysterectomy    . Abdominal surgery    . Right breast cyst removed       There were no vitals filed for this visit.  Visit Diagnosis:  Neck pain  Decreased ROM of neck  Neck stiffness  Shoulder weakness  Decreased shoulder mobility, right      Subjective Assessment - 05/20/14 0854    Subjective she reports that she had an ear ache over the weekend. had some relief in the neck but reports that it is sore today.    Currently in Pain? Yes   Pain Score 8    Pain Location Neck   Pain Orientation Mid;Lower;Posterior   Pain Descriptors / Indicators Aching;Burning;Dull;Sharp;Shooting                         [facility] Adult PT Treatment/Exercise - 05/20/14 0853    Neck Exercises: Machines for Strengthening   UBE (Upper Arm Bike) L 1.5 x 4 min   alt dir every 2 min   Neck Exercises: Standing   Neck Retraction 10 reps;5 secs  cueing for proper form against wall.    Neck Exercises: Seated   Neck Retraction 5 reps;5 secs   Neck Retraction Limitations difficulty doing exercise without cueing reported some pain  in back during exercises.    Other Seated Exercise 3 finger cervical rotation x 10   Other Seated Exercise bil shoulder retraction with ER 2 x 10 using green theraband  focus on squeezing shoulder blades together.    Modalities   Modalities Traction   Traction   Type of Traction Cervical  50% speed with intermittent tx   Min (lbs) 0   Max (lbs) 15   Hold Time 1 min   Rest Time 1 min   Time 15   Manual Therapy   Manual Therapy Massage;Joint mobilization   Joint Mobilization cervical mobilizations grad 2-3 p/a and lateral (gapping)   Massage upper trap/ sub-occipital release/ trigger point release bil   Neck Exercises: Stretches   Upper Trapezius Stretch 2 reps;30 seconds   Upper Trapezius Stretch Limitations significant tightness   Levator Stretch 2 reps;30 seconds                PT Education - 05/20/14 0933    Education provided Yes   Education Details performing UE activities and avoiding R shoulder hiking.    Person(s) Educated Patient   Methods Explanation   Comprehension Verbalized understanding  PT Short Term Goals - 05/17/14 5809    PT SHORT TERM GOAL #1   Title pt will be I with Basic HEP 05/27/2014   Time 4   Period Weeks   Status On-going   PT SHORT TERM GOAL #2   Title she will increase Cervical rotation bil by > 10 degrees to assist with safety during driving 09/25/3380   Time 4   Period Weeks   Status On-going   PT SHORT TERM GOAL #3   Title pt will decreased neck pain to <5/10 to assist with functional exercise 05/27/2014   Time 4   Period Weeks   Status On-going   PT SHORT TERM GOAL #4   Title pt will increase R shoulder flexion/abduction to > 4-/5 to assist with ADL's 05/27/2014   Time 4   Period Weeks   Status On-going           PT Long Term Goals - 05/17/14 0928    PT LONG TERM GOAL #1   Title pt will be I with advanced HEP 07/06/2014   Time 8   Period Weeks   Status On-going   PT LONG TERM GOAL #2   Title pt will Increase  overall cervical mobility by >20 degrees in all planes to assist with functional mobility and safety during driving 05/23/3974   Time 8   Period Weeks   Status On-going   PT LONG TERM GOAL #3   Title pt will decrease cervical pain to <3/10 to assist with ADLs, and functional exercises 07/06/2014   Time 8   Period Weeks   Status On-going   PT LONG TERM GOAL #4   Title pt will increase R shoulder strength to >4+/5 to help with ADLs 07/06/2014   Time 8   Period Weeks   Status On-going   PT LONG TERM GOAL #5   Title pt will be able to verbalize and demonstrate techniques to reduce risk or cervical reinjury by postural awareness, and HEP   Time 8   Period Weeks   Status New               Plan - 05/20/14 0934    Clinical Impression Statement Cheryl King presents to therapy today stating that she was a 8/10 in the neck. She reported that she liked the cervical traction last visit.  She continues to demonstrate significant R upper trap tightness and levator activaiton. Following STM/ trigger point release she reported significant relief reported as 2/10.  She demonstrated difficulty during DNF exercises demonstrating back pain due to difficulty performing exercise while seatedm, moved exercise to standing agains the wall to facilitate proper form.  plan to progress with exercises as tolerated.    PT Next Visit Plan cervical mobiliation, nueral flossing, Shoulder strengtheing/scapular stabilization   PT Home Exercise Plan use of UE without hiking the shoulder.    Consulted and Agree with Plan of Care Patient        Problem List Patient Active Problem List   Diagnosis Date Noted  . Smoking 12/26/2013  . Chronic lower back pain 04/13/2013  . Sciatica 04/13/2013  . Chronic neck pain 04/13/2013  . Spondylosis of cervical spine 04/13/2013   Starr Lake PT, DPT, LAT, ATC  05/20/2014  10:04 AM    [facility] [address] [place], [place], [address] Phone: [phone]   Fax:  [phone]

## 2014-05-22 ENCOUNTER — Ambulatory Visit: Payer: No Typology Code available for payment source | Admitting: Physical Therapy

## 2014-05-22 DIAGNOSIS — M436 Torticollis: Secondary | ICD-10-CM

## 2014-05-22 DIAGNOSIS — M542 Cervicalgia: Secondary | ICD-10-CM

## 2014-05-22 DIAGNOSIS — M25611 Stiffness of right shoulder, not elsewhere classified: Secondary | ICD-10-CM

## 2014-05-22 DIAGNOSIS — R29898 Other symptoms and signs involving the musculoskeletal system: Secondary | ICD-10-CM

## 2014-05-22 NOTE — Patient Instructions (Signed)
PERFORM THESE EXERCISES LYING ON YOUR BACK WITH KNEES BENT   Chest Fly   Lie on back with knees bent, arms extended to sides and slightly bent, palms facing up. Inhale, then exhale while bringing weights together, arms straight. Hold 1 count. Slowly return to starting position. Repeat _20___ times per set. Do _1___ sets per session. Do _7___ sessions per week. May be done with dumbbells, tubing or resistive band.  Copyright  VHI. All rights reserved.      PNF Strengthening: Resisted   Standing with resistive band around each hand, bring right arm up and away, thumb back. Repeat __10__ times each side. Do __2__ sets per session. Do __2__ sessions per day.  http://orth.exer.us/918

## 2014-05-22 NOTE — Therapy (Signed)
[place], [place], [address] Phone: [phone]   Fax:  [phone]  Physical Therapy Treatment  Patient Details  Name: Cheryl King MRN: [id number] Date of Birth: [birth year] Referring Provider:  Lance Bosch, NP  Encounter Date: 05/22/2014      PT End of Session - 05/22/14 0932    Visit Number 4   Number of Visits 18   Date for PT Re-Evaluation 07/06/14   PT Start Time 0842   PT Stop Time 0940   PT Time Calculation (min) 58 min      Past Medical History  Diagnosis Date  . Low back pain   . Sacroiliac inflammation   . Seasonal allergies   . Diverticulitis   . Sciatica     Past Surgical History  Procedure Laterality Date  . Ankle surgery      lt.  Marland Kitchen Uretherl      uretrral diverticultis  . Abdominal hysterectomy    . Abdominal surgery    . Right breast cyst removed       There were no vitals filed for this visit.  Visit Diagnosis:  Neck pain  Decreased ROM of neck  Neck stiffness  Shoulder weakness  Decreased shoulder mobility, right      Subjective Assessment - 05/22/14 0845    Subjective I have been doing the exercises   Pain Score 6    Pain Location Neck   Pain Orientation Mid;Lower   Aggravating Factors  prolonged positions   Pain Relieving Factors rub it, change positions            [facility] PT Assessment - 05/22/14 0909    AROM   Cervical Flexion 55   Cervical Extension 35   Cervical - Right Side Bend 35   Cervical - Left Side Bend 30   Cervical - Right Rotation 60   Cervical - Left Rotation 60                     [facility] Adult PT Treatment/Exercise - 05/22/14 0843    Neck Exercises: Machines for Strengthening   UBE (Upper Arm Bike) L 1.5 x 4 min   alt dir every 2 min   Neck Exercises: Theraband   Shoulder External Rotation 20 reps   Shoulder External Rotation Limitations yellow band supine   Horizontal ABduction 20 reps   Horizontal  ABduction Limitations yellow band supine, diagonals as well x 10 each   Neck Exercises: Supine   Neck Retraction 10 reps;5 secs   Other Supine Exercise shoulder press with chest lift 5sec x 10 in decompression position.    Other Supine Exercise supine cervical stabilization including chin tucks with arm circles, horiz abdct, alt flex/ext x 10.    Modalities   Modalities Traction   Traction   Type of Traction Cervical  50% speed with intermittent tx   Min (lbs) 0   Max (lbs) 15   Hold Time 49mn   Rest Time 30 sec   Time 15   Manual Therapy   Manual Therapy Passive ROM   Passive ROM passive upper trap and levator stretching 3x30 each in supine                PT Education - 05/22/14 0928    Education provided Yes   Education Details HEP: supine neck stab (from drawer) and supine scapular bands    Person(s) Educated Patient   Methods Explanation;Handout   Comprehension  Verbalized understanding          PT Short Term Goals - 05/22/14 0937    PT SHORT TERM GOAL #1   Title pt will be I with Basic HEP 05/27/2014   Time 4   Period Weeks   Status Achieved   PT SHORT TERM GOAL #2   Title she will increase Cervical rotation bil by > 10 degrees to assist with safety during driving 06/25/1592   Time 4   Period Weeks   Status Achieved   PT SHORT TERM GOAL #3   Title pt will decreased neck pain to <5/10 to assist with functional exercise 05/27/2014   Time 4   Period Weeks   Status On-going   PT SHORT TERM GOAL #4   Title pt will increase R shoulder flexion/abduction to > 4-/5 to assist with ADL's 05/27/2014   Time 4   Period Weeks   Status On-going           PT Long Term Goals - 05/17/14 0928    PT LONG TERM GOAL #1   Title pt will be I with advanced HEP 07/06/2014   Time 8   Period Weeks   Status On-going   PT LONG TERM GOAL #2   Title pt will Increase overall cervical mobility by >20 degrees in all planes to assist with functional mobility and safety during driving  07/24/6149   Time 8   Period Weeks   Status On-going   PT LONG TERM GOAL #3   Title pt will decrease cervical pain to <3/10 to assist with ADLs, and functional exercises 07/06/2014   Time 8   Period Weeks   Status On-going   PT LONG TERM GOAL #4   Title pt will increase R shoulder strength to >4+/5 to help with ADLs 07/06/2014   Time 8   Period Weeks   Status On-going   PT LONG TERM GOAL #5   Title pt will be able to verbalize and demonstrate techniques to reduce risk or cervical reinjury by postural awareness, and HEP   Time 8   Period Weeks   Status New               Plan - 05/22/14 0848    Clinical Impression Statement STG# 1,2,3 met. Cervical AROM has improved in all planes. Cervical rotation is now 60 degrees bilateral and she notes an improvement with ease of turning head when driving. Patient reports relief of pain after cervical traction for 2 hours and then pain returns. The N/T and pain in arm is intermittent now and she has an episode every couple of days. Pt instructed in supine scap strength and cervical stabilization to progress patient toward strength goals.    PT Next Visit Plan cervical mobiliation, nueral flossing, Shoulder strengtheing/scapular stabilization, REVIEW Cervical STAB HEP        Problem List Patient Active Problem List   Diagnosis Date Noted  . Smoking 12/26/2013  . Chronic lower back pain 04/13/2013  . Sciatica 04/13/2013  . Chronic neck pain 04/13/2013  . Spondylosis of cervical spine 04/13/2013    Dorene Ar, PTA 05/22/2014, 9:39 AM  [facility] [address] [place], [place], [address] Phone: [phone]   Fax:  [phone]

## 2014-05-27 ENCOUNTER — Ambulatory Visit: Payer: No Typology Code available for payment source | Admitting: Physical Therapy

## 2014-05-27 DIAGNOSIS — M25611 Stiffness of right shoulder, not elsewhere classified: Secondary | ICD-10-CM

## 2014-05-27 DIAGNOSIS — M542 Cervicalgia: Secondary | ICD-10-CM

## 2014-05-27 DIAGNOSIS — M436 Torticollis: Secondary | ICD-10-CM

## 2014-05-27 DIAGNOSIS — R29898 Other symptoms and signs involving the musculoskeletal system: Secondary | ICD-10-CM

## 2014-05-27 NOTE — Therapy (Signed)
[place], [place], [address] Phone: [phone]   Fax:  [phone]  Physical Therapy Treatment  Patient Details  Name: Cheryl King MRN: [id number] Date of Birth: [birth year] Referring Provider:  Lance Bosch, NP  Encounter Date: 05/27/2014      PT End of Session - 05/27/14 1003    Visit Number 5   Number of Visits 18   Date for PT Re-Evaluation 07/06/14   PT Start Time 0845   PT Stop Time 0934   PT Time Calculation (min) 49 min   Activity Tolerance Patient tolerated treatment well   Behavior During Therapy [facility] for tasks assessed/performed      Past Medical History  Diagnosis Date  . Low back pain   . Sacroiliac inflammation   . Seasonal allergies   . Diverticulitis   . Sciatica     Past Surgical History  Procedure Laterality Date  . Ankle surgery      lt.  Marland Kitchen Uretherl      uretrral diverticultis  . Abdominal hysterectomy    . Abdominal surgery    . Right breast cyst removed       There were no vitals filed for this visit.  Visit Diagnosis:  Neck pain  Decreased ROM of neck  Neck stiffness  Shoulder weakness  Decreased shoulder mobility, right      Subjective Assessment - 05/27/14 0849    Subjective She reports that she had pain everywhere, She reported that she didn't do her HEP because she was in pain. She reports that her R wrist is sore today from cleaning the other day.    Currently in Pain? Yes   Pain Score 7    Pain Location Neck   Pain Orientation Mid;Lower   Pain Descriptors / Indicators Aching;Burning;Shooting   Pain Onset More than a month ago   Pain Frequency Constant                         [facility] Adult PT Treatment/Exercise - 05/27/14 0849    Neck Exercises: Machines for Strengthening   UBE (Upper Arm Bike) L 1.5 x 4 min    Neck Exercises: Standing   Neck Retraction 10 reps;5 secs  against wall to assist with proper form   Neck  Retraction Limitations x 10 with multi angle cervical retractions.    Other Standing Exercises rows x 15 with red theraband   Other Standing Exercises shoulder ext 1 x 15  with red theraband.    Neck Exercises: Seated   Other Seated Exercise snags with towel 2 x 10 ea direction   Traction   Type of Traction Cervical   Min (lbs) 0   Max (lbs) 15   Hold Time 28min   Rest Time 30 sec   Time 10   Manual Therapy   Manual Therapy Other (comment)   Massage upper trap/ sub-occipital release/ trigger point release bil   Other Manual Therapy L upper trap inhibiton                PT Education - 05/27/14 1002    Education provided Yes   Education Details dry needling handout   Person(s) Educated Patient   Methods Explanation   Comprehension Verbalized understanding          PT Short Term Goals - 05/27/14 9735    PT SHORT TERM GOAL #1   Title pt will be I with Basic  HEP 05/27/2014   Time 4   Period Weeks   Status Achieved   PT SHORT TERM GOAL #2   Title she will increase Cervical rotation bil by > 10 degrees to assist with safety during driving 0/09/3233   Time 4   Period Weeks   Status Achieved   PT SHORT TERM GOAL #3   Title pt will decreased neck pain to <5/10 to assist with functional exercise 05/27/2014   Time 4   Period Weeks   Status On-going   PT SHORT TERM GOAL #4   Title pt will increase R shoulder flexion/abduction to > 4-/5 to assist with ADL's 05/27/2014   Time 4   Period Weeks   Status On-going           PT Long Term Goals - 05/27/14 0852    PT LONG TERM GOAL #1   Title pt will be I with advanced HEP 07/06/2014   Time 8   Period Weeks   Status On-going   PT LONG TERM GOAL #2   Title pt will Increase overall cervical mobility by >20 degrees in all planes to assist with functional mobility and safety during driving 5/73/2202   Time 8   Period Weeks   Status On-going   PT LONG TERM GOAL #3   Title pt will decrease cervical pain to <3/10 to assist  with ADLs, and functional exercises 07/06/2014   Time 8   Period Weeks   Status On-going   PT LONG TERM GOAL #4   Title pt will increase R shoulder strength to >4+/5 to help with ADLs 07/06/2014   Time 8   Period Weeks   Status On-going   PT LONG TERM GOAL #5   Title pt will be able to verbalize and demonstrate techniques to reduce risk or cervical reinjury by postural awareness, and HEP   Time 8   Period Weeks   Status New               Plan - 05/27/14 1004    Clinical Impression Statement Cheryl King reports to therapy today stating that she has increased soreness since over the weekend. No new goals me this visit. She continues to demonstrate bil upper tap tightness and dominance. She reported relief following STM/ trigger point release . she continues to report relief from traction stating that it last for a few hours after, and reports that she doesn't feel the tingling in to her arm as much anymore.  Edcuated on dry needling today, and will try to get her on Lawrie's or Stacy's schedule ot help with her upper trap/levator muscle tightnes. performed L upper trap inhibition taping today which she reported no pain after.    PT Next Visit Plan assess response to upper trap inhibition taping, cervical mobiliation, nueral flossing, Shoulder strengtheing/scapular stabilization, dry needling? REVIEW Cervical STAB HEP   PT Home Exercise Plan dry needling handout   Consulted and Agree with Plan of Care Patient        Problem List Patient Active Problem List   Diagnosis Date Noted  . Smoking 12/26/2013  . Chronic lower back pain 04/13/2013  . Sciatica 04/13/2013  . Chronic neck pain 04/13/2013  . Spondylosis of cervical spine 04/13/2013   Starr Lake PT, DPT, LAT, ATC  05/27/2014  10:18 AM   [facility] [address] [place], [place], [address] Phone: [phone]   Fax:  [phone]

## 2014-05-27 NOTE — Patient Instructions (Signed)

## 2014-05-28 ENCOUNTER — Other Ambulatory Visit: Payer: Self-pay | Admitting: Internal Medicine

## 2014-05-29 ENCOUNTER — Ambulatory Visit: Payer: No Typology Code available for payment source | Admitting: Physical Therapy

## 2014-05-29 DIAGNOSIS — R29898 Other symptoms and signs involving the musculoskeletal system: Secondary | ICD-10-CM

## 2014-05-29 DIAGNOSIS — M25611 Stiffness of right shoulder, not elsewhere classified: Secondary | ICD-10-CM

## 2014-05-29 DIAGNOSIS — M436 Torticollis: Secondary | ICD-10-CM

## 2014-05-29 DIAGNOSIS — M542 Cervicalgia: Secondary | ICD-10-CM

## 2014-05-29 NOTE — Therapy (Signed)
[place], [place], [address] Phone: [phone]   Fax:  [phone]  Physical Therapy Treatment  Patient Details  Name: Cheryl King MRN: [id number] Date of Birth: 02-23-1965 Referring Provider:  Lance Bosch, NP  Encounter Date: 05/29/2014      PT End of Session - 05/29/14 0948    Visit Number 6   Number of Visits 18   Date for PT Re-Evaluation 07/06/14   PT Start Time 0846   PT Stop Time 0940   PT Time Calculation (min) 54 min      Past Medical History  Diagnosis Date  . Low back pain   . Sacroiliac inflammation   . Seasonal allergies   . Diverticulitis   . Sciatica     Past Surgical History  Procedure Laterality Date  . Ankle surgery      lt.  Marland Kitchen Uretherl      uretrral diverticultis  . Abdominal hysterectomy    . Abdominal surgery    . Right breast cyst removed       There were no vitals filed for this visit.  Visit Diagnosis:  Neck pain  Decreased ROM of neck  Neck stiffness  Shoulder weakness  Decreased shoulder mobility, right      Subjective Assessment - 05/29/14 0857    Subjective The tape helped. It's not as tight.    Currently in Pain? Yes   Pain Score 5    Pain Location Neck   Aggravating Factors  walking, alot of reaching   Pain Relieving Factors change positions                         [facility] Adult PT Treatment/Exercise - 05/29/14 0852    Neck Exercises: Machines for Strengthening   UBE (Upper Arm Bike) L 1.5 x 5 min    Neck Exercises: Supine   Other Supine Exercise reformer -see note   Modalities   Modalities Electrical Stimulation;Moist Heat   Moist Heat Therapy   Number Minutes Moist Heat 15 Minutes   Moist Heat Location --  neck   Electrical Stimulation   Electrical Stimulation Location --  neck   Electrical Stimulation Action IFC   Electrical Stimulation Parameters 7   Electrical Stimulation Goals Pain      Pilates  Reformer used for LE/core strength, postural strength, lumbopelvic disassociation and core control.  Exercises included: Supine Arm work: 1 Red: arcs x 10 forward and lateral, circles x 10 each way, cues for neutral spine and Tra contract Long box Prone: 1 red, posterior deltoid pulls x 10, triceps x 10 cues for neutral cervical spine, Tra contract Seated Arms: 1 red, rows x 10 posterior deltoid pulls x 10 cues for posture and neutral spine, Tra contract               PT Short Term Goals - 05/27/14 1829    PT SHORT TERM GOAL #1   Title pt will be I with Basic HEP 05/27/2014   Time 4   Period Weeks   Status Achieved   PT SHORT TERM GOAL #2   Title she will increase Cervical rotation bil by > 10 degrees to assist with safety during driving 09/21/7167   Time 4   Period Weeks   Status Achieved   PT SHORT TERM GOAL #3   Title pt will decreased neck pain to <5/10 to assist with functional exercise 05/27/2014   Time 4  Period Weeks   Status On-going   PT SHORT TERM GOAL #4   Title pt will increase R shoulder flexion/abduction to > 4-/5 to assist with ADL's 05/27/2014   Time 4   Period Weeks   Status On-going           PT Long Term Goals - 05/27/14 8381    PT LONG TERM GOAL #1   Title pt will be I with advanced HEP 07/06/2014   Time 8   Period Weeks   Status On-going   PT LONG TERM GOAL #2   Title pt will Increase overall cervical mobility by >20 degrees in all planes to assist with functional mobility and safety during driving 8/40/3754   Time 8   Period Weeks   Status On-going   PT LONG TERM GOAL #3   Title pt will decrease cervical pain to <3/10 to assist with ADLs, and functional exercises 07/06/2014   Time 8   Period Weeks   Status On-going   PT LONG TERM GOAL #4   Title pt will increase R shoulder strength to >4+/5 to help with ADLs 07/06/2014   Time 8   Period Weeks   Status On-going   PT LONG TERM GOAL #5   Title pt will be able to verbalize and demonstrate  techniques to reduce risk or cervical reinjury by postural awareness, and HEP   Time 8   Period Weeks   Status New               Plan - 05/29/14 0929    Clinical Impression Statement Pt reports good decrease in pain with trap inhibition taping and is c/o pain middle of posterior neck around T-1, also some right upper trap pain. OPt reports short term relief with traction therefore trial of electrical stim with HMP today,. Pt introduced to [employer] officer, political party for scapular stabilization and cervical stabilization without increased pain.    PT Next Visit Plan continue reformer as tolerated, assess response to estim, reapply inhibition tape bilateral?        Problem List Patient Active Problem List   Diagnosis Date Noted  . Smoking 12/26/2013  . Chronic lower back pain 04/13/2013  . Sciatica 04/13/2013  . Chronic neck pain 04/13/2013  . Spondylosis of cervical spine 04/13/2013    Dorene Ar, [place] 05/29/2014, 9:56 AM  [facility] [address] [place], [place], [address] Phone: [phone]   Fax:  [phone]

## 2014-06-03 ENCOUNTER — Ambulatory Visit: Payer: No Typology Code available for payment source | Admitting: Physical Therapy

## 2014-06-03 ENCOUNTER — Other Ambulatory Visit: Payer: Self-pay | Admitting: Internal Medicine

## 2014-06-03 DIAGNOSIS — M25611 Stiffness of right shoulder, not elsewhere classified: Secondary | ICD-10-CM

## 2014-06-03 DIAGNOSIS — R29898 Other symptoms and signs involving the musculoskeletal system: Secondary | ICD-10-CM

## 2014-06-03 DIAGNOSIS — M542 Cervicalgia: Secondary | ICD-10-CM

## 2014-06-03 DIAGNOSIS — M436 Torticollis: Secondary | ICD-10-CM

## 2014-06-03 NOTE — Therapy (Signed)
[place], [place], [address] Phone: [phone]   Fax:  [phone]  Physical Therapy Treatment  Patient Details  Name: Cheryl King MRN: [id number] Date of Birth: [birth year] Referring Provider:  Jessy Oto, MD  Encounter Date: 06/03/2014      PT End of Session - 06/03/14 1345    Visit Number 7   Number of Visits 18   PT Start Time 0845   PT Stop Time 0930   PT Time Calculation (min) 45 min      Past Medical History  Diagnosis Date  . Low back pain   . Sacroiliac inflammation   . Seasonal allergies   . Diverticulitis   . Sciatica     Past Surgical History  Procedure Laterality Date  . Ankle surgery      lt.  Marland Kitchen Uretherl      uretrral diverticultis  . Abdominal hysterectomy    . Abdominal surgery    . Right breast cyst removed       There were no vitals filed for this visit.  Visit Diagnosis:  Neck pain  Decreased ROM of neck  Neck stiffness  Shoulder weakness  Decreased shoulder mobility, right      Subjective Assessment - 06/03/14 0849    Subjective I still have pain in the back of my neck. The electrical stimulation helped a little. The upper traps are better.    Currently in Pain? Yes   Pain Score 5    Pain Location Neck   Pain Orientation Mid;Lower   Pain Descriptors / Indicators Aching   Pain Type Chronic pain   Pain Frequency Constant            [facility] PT Assessment - 06/03/14 0001    Strength   Right Shoulder Flexion 4-/5   Right Shoulder ABduction 4-/5   Right Shoulder Internal Rotation 4/5   Right Shoulder External Rotation 4/5   Grip (lbs) grip strength 46# on R and 52lbs on L.                     [facility] Adult PT Treatment/Exercise - 06/03/14 0001    Neck Exercises: Theraband   Shoulder External Rotation 20 reps   Shoulder External Rotation Limitations green band  supine and seated   Horizontal ABduction 20 reps   Horizontal  ABduction Limitations green band supine, diagonals as well x 10 each   Neck Exercises: Standing   Other Standing Exercises seated horizontal abduction x10, then diagonals x 10, diagonals x 10 each    Neck Exercises: Supine   Other Supine Exercise supine cervical stabilization including chin tucks with arm circles, horiz abdct, alt flex/ext x 10.    Modalities   Modalities Traction   Traction   Type of Traction Cervical   Min (lbs) 5   Max (lbs) 18   Hold Time 60 sec   Rest Time 30 sec   Time 15                  PT Short Term Goals - 05/27/14 2536    PT SHORT TERM GOAL #1   Title pt will be I with Basic HEP 05/27/2014   Time 4   Period Weeks   Status Achieved   PT SHORT TERM GOAL #2   Title she will increase Cervical rotation bil by > 10 degrees to assist with safety during driving 06/21/4032   Time 4   Period  Weeks   Status Achieved   PT SHORT TERM GOAL #3   Title pt will decreased neck pain to <5/10 to assist with functional exercise 05/27/2014   Time 4   Period Weeks   Status On-going   PT SHORT TERM GOAL #4   Title pt will increase R shoulder flexion/abduction to > 4-/5 to assist with ADL's 05/27/2014   Time 4   Period Weeks   Status On-going           PT Long Term Goals - 05/27/14 2025    PT LONG TERM GOAL #1   Title pt will be I with advanced HEP 07/06/2014   Time 8   Period Weeks   Status On-going   PT LONG TERM GOAL #2   Title pt will Increase overall cervical mobility by >20 degrees in all planes to assist with functional mobility and safety during driving 05/14/621   Time 8   Period Weeks   Status On-going   PT LONG TERM GOAL #3   Title pt will decrease cervical pain to <3/10 to assist with ADLs, and functional exercises 07/06/2014   Time 8   Period Weeks   Status On-going   PT LONG TERM GOAL #4   Title pt will increase R shoulder strength to >4+/5 to help with ADLs 07/06/2014   Time 8   Period Weeks   Status On-going   PT LONG TERM GOAL #5    Title pt will be able to verbalize and demonstrate techniques to reduce risk or cervical reinjury by postural awareness, and HEP   Time 8   Period Weeks   Status New               Plan - 06/03/14 0857    Clinical Impression Statement Pt reports no increased pain after Pilates reformer exercises last visit. She reports minimal decrease in pain with electrical stimulation. Her right shoulder strength has improved however still demonstrates weakness. Her right grip strength has improved to 45# and her left is 52#. She reports she no longer has spasms in her bilateral upper traps and declines the need for tape. The focus today was strengthening her shoulders.    PT Next Visit Plan continue reformer as tolerated,  reapply inhibition tape bilateral as needed.         Problem List Patient Active Problem List   Diagnosis Date Noted  . Smoking 12/26/2013  . Chronic lower back pain 04/13/2013  . Sciatica 04/13/2013  . Chronic neck pain 04/13/2013  . Spondylosis of cervical spine 04/13/2013    Dorene Ar, PTA 06/03/2014, 1:48 PM  [place], [place], [address] Phone: [phone]   Fax:  [phone]

## 2014-06-05 ENCOUNTER — Ambulatory Visit: Payer: No Typology Code available for payment source | Admitting: Physical Therapy

## 2014-06-05 ENCOUNTER — Telehealth: Payer: Self-pay | Admitting: General Practice

## 2014-06-05 NOTE — Telephone Encounter (Signed)
Patient presents to clinic requesting medication refill for omeprazole (PRILOSEC) 20 MG capsule  Advised patient that she may need an appointment scheduled since she hasn't seen provider since 12/15. Patient states she was told to return as needed. States that all she needs is medications at this time.  Please assist.

## 2014-06-10 ENCOUNTER — Ambulatory Visit: Payer: No Typology Code available for payment source | Admitting: Physical Therapy

## 2014-06-10 DIAGNOSIS — M436 Torticollis: Secondary | ICD-10-CM

## 2014-06-10 DIAGNOSIS — M542 Cervicalgia: Secondary | ICD-10-CM

## 2014-06-10 DIAGNOSIS — M25611 Stiffness of right shoulder, not elsewhere classified: Secondary | ICD-10-CM

## 2014-06-10 DIAGNOSIS — R29898 Other symptoms and signs involving the musculoskeletal system: Secondary | ICD-10-CM

## 2014-06-10 NOTE — Therapy (Signed)
[place], [place], [address] Phone: [phone]   Fax:  [phone]  Physical Therapy Treatment  Patient Details  Name: Cheryl King MRN: [id number] Date of Birth: 11-11-1965 Referring Provider:  Lance Bosch, NP  Encounter Date: 06/10/2014      PT End of Session - 06/10/14 0922    Visit Number 8   Number of Visits 18   Date for PT Re-Evaluation 07/06/14   PT Start Time 9798   PT Stop Time 0935   PT Time Calculation (min) 48 min      Past Medical History  Diagnosis Date  . Low back pain   . Sacroiliac inflammation   . Seasonal allergies   . Diverticulitis   . Sciatica     Past Surgical History  Procedure Laterality Date  . Ankle surgery      lt.  Marland Kitchen Uretherl      uretrral diverticultis  . Abdominal hysterectomy    . Abdominal surgery    . Right breast cyst removed       There were no vitals filed for this visit.  Visit Diagnosis:  Neck pain  Decreased ROM of neck  Neck stiffness  Shoulder weakness  Decreased shoulder mobility, right      Subjective Assessment - 06/10/14 0900    Subjective I've been having headaches again and my upper traps are hurting again. My Sciatica is bothering me.  I hope my doctor refers me for my back.    Currently in Pain? Yes   Pain Score 7    Pain Location Neck   Pain Orientation Mid;Lower   Pain Descriptors / Indicators Aching   Aggravating Factors  walking, alot of reaching   Pain Relieving Factors change positions            [facility] PT Assessment - 06/10/14 0850    AROM   Cervical - Right Side Bend 40   Cervical - Left Side Bend 32   Cervical - Right Rotation 45   Cervical - Left Rotation 55                     [facility] Adult PT Treatment/Exercise - 06/10/14 0859    Neck Exercises: Supine   Other Supine Exercise reformer -see note   Moist Heat Therapy   Number Minutes Moist Heat 15 Minutes   Moist Heat Location  --  neck   Electrical Stimulation   Electrical Stimulation Location --  neck   Electrical Stimulation Action IFC   Electrical Stimulation Parameters 7.5   Electrical Stimulation Goals Pain   Manual Therapy   Manual Therapy Taping   McConnell Bilateral upper trap inhibition.     Pilates Reformer used for LE/core strength, postural strength, lumbopelvic disassociation and core control.  Exercises included: Supine Arm work: t red i yellow: arcs and circles with cues for scap depression and neutral spine.                PT Short Term Goals - 05/27/14 9211    PT SHORT TERM GOAL #1   Title pt will be I with Basic HEP 05/27/2014   Time 4   Period Weeks   Status Achieved   PT SHORT TERM GOAL #2   Title she will increase Cervical rotation bil by > 10 degrees to assist with safety during driving 09/22/1738   Time 4   Period Weeks   Status Achieved   PT SHORT TERM  GOAL #3   Title pt will decreased neck pain to <5/10 to assist with functional exercise 05/27/2014   Time 4   Period Weeks   Status On-going   PT SHORT TERM GOAL #4   Title pt will increase R shoulder flexion/abduction to > 4-/5 to assist with ADL's 05/27/2014   Time 4   Period Weeks   Status On-going           PT Long Term Goals - 06/10/14 0915    PT LONG TERM GOAL #1   Title pt will be I with advanced HEP 07/06/2014   Time 8   Period Weeks   Status On-going   PT LONG TERM GOAL #2   Title pt will Increase overall cervical mobility by >20 degrees in all planes to assist with functional mobility and safety during driving 02/14/5168   Time 8   Period Weeks   Status On-going   PT LONG TERM GOAL #3   Title pt will decrease cervical pain to <3/10 to assist with ADLs, and functional exercises 07/06/2014   Time 8   Period Weeks   Status On-going   PT LONG TERM GOAL #4   Title pt will increase R shoulder strength to >4+/5 to help with ADLs 07/06/2014   Time 8   Period Weeks   Status On-going   PT LONG TERM GOAL #5    Title pt will be able to verbalize and demonstrate techniques to reduce risk or cervical reinjury by postural awareness, and HEP   Time 8   Period Days   Status Achieved               Plan - 06/10/14 0917    Clinical Impression Statement Pt presents today with increased neck pain and upper trap spasms left < right. Her cervical AROM is less than last measured due to increased pain and tightness. Upper trap inhibition tape applied to bilateral per pt request. Pt is perfoming HEP daily except on sayd with severe headaches. Pt is aware of posture and the roll it plays in pain. LTG#5 met. Focus today on shoulder stength/ scap stab and pain management. Pt is schedule for trial of dry needling.    PT Next Visit Plan continue reformer as tolerated,  reapply inhibition tape bilateral as needed. Dry needling next visit.         Problem List Patient Active Problem List   Diagnosis Date Noted  . Smoking 12/26/2013  . Chronic lower back pain 04/13/2013  . Sciatica 04/13/2013  . Chronic neck pain 04/13/2013  . Spondylosis of cervical spine 04/13/2013    Dorene Ar, [place] 06/10/2014, 9:23 AM  [facility] [address] [place], [place], [address] Phone: [phone]   Fax:  [phone]

## 2014-06-11 ENCOUNTER — Ambulatory Visit: Payer: No Typology Code available for payment source | Admitting: Physical Therapy

## 2014-06-11 DIAGNOSIS — M542 Cervicalgia: Secondary | ICD-10-CM

## 2014-06-11 DIAGNOSIS — M25611 Stiffness of right shoulder, not elsewhere classified: Secondary | ICD-10-CM

## 2014-06-11 DIAGNOSIS — M436 Torticollis: Secondary | ICD-10-CM

## 2014-06-11 DIAGNOSIS — R29898 Other symptoms and signs involving the musculoskeletal system: Secondary | ICD-10-CM

## 2014-06-11 NOTE — Therapy (Signed)
[place], [place], [address] Phone: [phone]   Fax:  [phone]  Physical Therapy Treatment  Patient Details  Name: Cheryl King MRN: [id number] Date of Birth: [birth year] Referring Provider:  Lance Bosch, NP  Encounter Date: 06/11/2014      PT End of Session - 06/11/14 0848    Visit Number 9   Number of Visits 18   Date for PT Re-Evaluation 07/06/14   PT Start Time 2774   PT Stop Time 0946   PT Time Calculation (min) 59 min   Activity Tolerance Patient tolerated treatment well   Behavior During Therapy [facility] for tasks assessed/performed      Past Medical History  Diagnosis Date  . Low back pain   . Sacroiliac inflammation   . Seasonal allergies   . Diverticulitis   . Sciatica     Past Surgical History  Procedure Laterality Date  . Ankle surgery      lt.  Marland Kitchen Uretherl      uretrral diverticultis  . Abdominal hysterectomy    . Abdominal surgery    . Right breast cyst removed       There were no vitals filed for this visit.  Visit Diagnosis:  Neck pain  Decreased ROM of neck  Neck stiffness  Shoulder weakness  Decreased shoulder mobility, right      Subjective Assessment - 06/11/14 0850    Subjective I have not had headaches since last week. Tape , I am not sure it is helping   How long can you sit comfortably? 15-20 min   How long can you stand comfortably? 15-20min   How long can you walk comfortably? 20 min to 30 minutes   Diagnostic tests Neck 2015 Athritis/ bone spurs per pt report   Patient Stated Goals To be pain free   Currently in Pain? Yes   Pain Score 6    Pain Location Neck   Pain Descriptors / Indicators Aching   Pain Type Chronic pain   Pain Onset More than a month ago            [facility] PT Assessment - 06/11/14 0921    AROM   Cervical - Right Side Bend 52   Cervical - Left Side Bend 48   Cervical - Right Rotation 45   Cervical - Left  Rotation 55                     [facility] Adult PT Treatment/Exercise - 06/11/14 0853    Neck Exercises: Supine   Other Supine Exercise --   Moist Heat Therapy   Number Minutes Moist Heat 15 Minutes   Moist Heat Location --  neck   Electrical Stimulation   Electrical Stimulation Location --  neck   Electrical Stimulation Action IFC   Electrical Stimulation Parameters 8   Electrical Stimulation Goals Pain   Manual Therapy   Manual Therapy --   Joint Mobilization C-3 to C-6 R lateral PA grade 3   Myofascial Release to bil upper trap and left L>R   McConnell --   Neck Exercises: Stretches   Upper Trapezius Stretch 2 reps;30 seconds   Upper Trapezius Stretch Limitations needed VC and holding onto chair   Levator Stretch 2 reps;30 seconds  VC and return demo          Trigger Point Dry Needling - 06/11/14 0855    Consent Given? Yes   Education Handout Provided  Yes   Muscles Treated Upper Body Upper trapezius;Levator scapulae  erector spinae bil C-4 to C-6   Upper Trapezius Response Twitch reponse elicited;Palpable increased muscle length  bil   Levator Scapulae Response Twitch response elicited;Palpable increased muscle length  bil              PT Education - 06/11/14 1227    Education provided Yes   Education Details Pt reviewed Upper trap and levator stretch and explained Trigger point dry needling precautians and aftercare   Person(s) Educated Patient   Methods Explanation;Demonstration;Handout;Verbal cues   Comprehension Verbalized understanding;Returned demonstration          PT Short Term Goals - 06/11/14 1314    PT SHORT TERM GOAL #1   Title pt will be I with Basic HEP 05/27/2014   Time 4   Period Weeks   Status Achieved   PT SHORT TERM GOAL #2   Title she will increase Cervical rotation bil by > 10 degrees to assist with safety during driving 08/21/6960   Time 4   Period Weeks   Status Achieved   PT SHORT TERM GOAL #3   Title pt will  decreased neck pain to <5/10 to assist with functional exercise 05/27/2014   Time 4   Period Weeks   Status On-going   PT SHORT TERM GOAL #4   Title pt will increase R shoulder flexion/abduction to > 4-/5 to assist with ADL's 05/27/2014   Time 4   Period Weeks   Status On-going           PT Long Term Goals - 06/10/14 0915    PT LONG TERM GOAL #1   Title pt will be I with advanced HEP 07/06/2014   Time 8   Period Weeks   Status On-going   PT LONG TERM GOAL #2   Title pt will Increase overall cervical mobility by >20 degrees in all planes to assist with functional mobility and safety during driving 9/52/8413   Time 8   Period Weeks   Status On-going   PT LONG TERM GOAL #3   Title pt will decrease cervical pain to <3/10 to assist with ADLs, and functional exercises 07/06/2014   Time 8   Period Weeks   Status On-going   PT LONG TERM GOAL #4   Title pt will increase R shoulder strength to >4+/5 to help with ADLs 07/06/2014   Time 8   Period Weeks   Status On-going   PT LONG TERM GOAL #5   Title pt will be able to verbalize and demonstrate techniques to reduce risk or cervical reinjury by postural awareness, and HEP   Time 8   Period Days   Status Achieved               Plan - 06/11/14 0930    Clinical Impression Statement Pt presents with no headache but a 6-7/10 neck pain left greater than Right. Pt educated in trigger point dry needling and proceeded with treatment.  Pt with increased lateral flexion Left and Right after tretatment.    Rehab Potential --   PT Frequency 2x / week   PT Duration 8 weeks   PT Treatment/Interventions ADLs/Self Care Home Management;Moist Heat;Therapeutic activities;Patient/family education;Passive range of motion;Therapeutic exercise;Ultrasound;Balance training;Manual techniques;Cryotherapy;Dry needling;Neuromuscular re-education;Electrical Stimulation;Other (comment)   PT Next Visit Plan continue reformeer as tolerated, assess dry needling  and progress as pt is able  Pt needs DNF exercises next visit   Consulted and Agree with  Plan of Care Patient        Problem List Patient Active Problem List   Diagnosis Date Noted  . Smoking 12/26/2013  . Chronic lower back pain 04/13/2013  . Sciatica 04/13/2013  . Chronic neck pain 04/13/2013  . Spondylosis of cervical spine 04/13/2013  Voncille Lo, PT 06/11/2014 1:17 PM Phone: [phone] Fax: [place], [place], [address] Phone: [phone]   Fax:  [phone]

## 2014-06-11 NOTE — Patient Instructions (Signed)
Trigger Point Dry Needling  . What is Trigger Point Dry Needling (DN)? o DN is a physical therapy technique used to treat muscle pain and dysfunction. Specifically, DN helps deactivate muscle trigger points (muscle knots).  o A thin filiform needle is used to penetrate the skin and stimulate the underlying trigger point. The goal is for a local twitch response (LTR) to occur and for the trigger point to relax. No medication of any kind is injected during the procedure.   . What Does Trigger Point Dry Needling Feel Like?  o The procedure feels different for each individual patient. Some patients report that they do not actually feel the needle enter the skin and overall the process is not painful. Very mild bleeding may occur. However, many patients feel a deep cramping in the muscle in which the needle was inserted. This is the local twitch response.   Marland Kitchen How Will I feel after the treatment? o Soreness is normal, and the onset of soreness may not occur for a few hours. Typically this soreness does not last longer than two days.  o Bruising is uncommon, however; ice can be used to decrease any possible bruising.  o In rare cases feeling tired or nauseous after the treatment is normal. In addition, your symptoms may get worse before they get better, this period will typically not last longer than 24 hours.   . What Can I do After My Treatment? o Increase your hydration by drinking more water for the next 24 hours. o You may place ice or heat on the areas treated that have become sore, however, do not use heat on inflamed or bruised areas. Heat often brings more relief post needling. o You can continue your regular activities, but vigorous activity is not recommended initially after the treatment for 24 hours. o DN is best combined with other physical therapy such as strengthening, stretching, and other therapies.  Voncille Lo, PT 06/11/2014 8:57 AM Phone: [phone] Fax: [phone]

## 2014-06-12 ENCOUNTER — Ambulatory Visit: Payer: No Typology Code available for payment source | Admitting: Physical Therapy

## 2014-06-12 DIAGNOSIS — M25611 Stiffness of right shoulder, not elsewhere classified: Secondary | ICD-10-CM

## 2014-06-12 DIAGNOSIS — R29898 Other symptoms and signs involving the musculoskeletal system: Secondary | ICD-10-CM

## 2014-06-12 DIAGNOSIS — M436 Torticollis: Secondary | ICD-10-CM

## 2014-06-12 DIAGNOSIS — M542 Cervicalgia: Secondary | ICD-10-CM

## 2014-06-12 NOTE — Therapy (Signed)
[place], [place], [address] Phone: [phone]   Fax:  [phone]  Physical Therapy Treatment  Patient Details  Name: Cheryl King MRN: [id number] Date of Birth: 1965-09-06 Referring Provider:  Lance Bosch, NP  Encounter Date: 06/12/2014      PT End of Session - 06/12/14 0857    Visit Number 10   Number of Visits 18   Date for PT Re-Evaluation 07/06/14   PT Start Time 0853   PT Stop Time 0945   PT Time Calculation (min) 52 min   Activity Tolerance Patient tolerated treatment well   Behavior During Therapy [facility] for tasks assessed/performed      Past Medical History  Diagnosis Date  . Low back pain   . Sacroiliac inflammation   . Seasonal allergies   . Diverticulitis   . Sciatica     Past Surgical History  Procedure Laterality Date  . Ankle surgery      lt.  Marland Kitchen Uretherl      uretrral diverticultis  . Abdominal hysterectomy    . Abdominal surgery    . Right breast cyst removed       There were no vitals filed for this visit.  Visit Diagnosis:  Neck pain - Plan: PT plan of care cert/re-cert  Decreased ROM of neck - Plan: PT plan of care cert/re-cert  Neck stiffness - Plan: PT plan of care cert/re-cert  Shoulder weakness - Plan: PT plan of care cert/re-cert  Decreased shoulder mobility, right - Plan: PT plan of care cert/re-cert      Subjective Assessment - 06/12/14 0855    Subjective "after dry needling the other day, I believe it kind of help but am feeling sore"     Currently in Pain? Yes   Pain Score 6    Pain Location Neck   Pain Descriptors / Indicators Aching;Sore   Pain Type Chronic pain   Pain Onset More than a month ago   Pain Frequency Constant            [facility] PT Assessment - 06/12/14 0905    Observation/Other Assessments   Focus on Therapeutic Outcomes (FOTO)  52% limitation   AROM   Right Shoulder Extension 48 Degrees   Right Shoulder Flexion  154 Degrees   Right Shoulder ABduction 140 Degrees   Right Shoulder Internal Rotation 70 Degrees   Right Shoulder External Rotation 78 Degrees   Cervical Flexion 40   Cervical Extension 45   Cervical - Right Side Bend 40   Cervical - Left Side Bend 42   Strength   Right Shoulder Flexion 4/5   Right Shoulder Extension 4+/5   Right Shoulder ABduction 4-/5  pain during testing   Right Shoulder Internal Rotation 4+/5   Right Shoulder External Rotation 4+/5   Left Hand Grip (lbs) grip strength 58# on R and 55lbs on L.                     [facility] Adult PT Treatment/Exercise - 06/12/14 0001    Neck Exercises: Machines for Strengthening   UBE (Upper Arm Bike) L 1.5 x 6 min    Traction   Type of Traction Cervical   Min (lbs) 5   Max (lbs) 18   Manual Therapy   Joint Mobilization C-3 to C-6 R lateral PA grade 3   Myofascial Release to bil upper trap and left L>R  Trigger Point Dry Needling - 06/11/14 0855    Consent Given? Yes   Education Handout Provided Yes   Muscles Treated Upper Body Upper trapezius;Levator scapulae  erector spinae bil C-4 to C-6   Upper Trapezius Response Twitch reponse elicited;Palpable increased muscle length  bil   Levator Scapulae Response Twitch response elicited;Palpable increased muscle length  bil              PT Education - 06/12/14 1211    Education provided Yes   Education Details updating POC, and reviewing HEP   Person(s) Educated Patient   Methods Explanation   Comprehension Verbalized understanding          PT Short Term Goals - 06/12/14 1244    PT SHORT TERM GOAL #1   Title pt will be I with Basic HEP 05/27/2014   Time 4   Period Weeks   Status Achieved   PT SHORT TERM GOAL #2   Title she will increase Cervical rotation bil by > 10 degrees to assist with safety during driving 5/0/3546   Time 4   Period Weeks   Status Achieved   PT SHORT TERM GOAL #4   Title pt will increase R shoulder  flexion/abduction to > 4-/5 to assist with ADL's 05/27/2014   Time 4   Period Weeks   Status On-going           PT Long Term Goals - 06/12/14 1245    PT LONG TERM GOAL #1   Title pt will be I with advanced HEP 07/06/2014   Time 8   Period Weeks   Status On-going   PT LONG TERM GOAL #2   Title pt will Increase overall cervical mobility by >20 degrees in all planes to assist with functional mobility and safety during driving 5/68/1275   Time 8   Period Weeks   Status On-going   PT LONG TERM GOAL #3   Title pt will decrease cervical pain to <3/10 to assist with ADLs, and functional exercises 07/06/2014   Time 8   Period Weeks   Status On-going   PT LONG TERM GOAL #4   Title pt will increase R shoulder strength to >4+/5 to help with ADLs 07/06/2014   Period Weeks   Status On-going   PT LONG TERM GOAL #5   Title pt will be able to verbalize and demonstrate techniques to reduce risk or cervical reinjury by postural awareness, and HEP   Time 8   Period Days   Status Achieved               Plan - 06/12/14 1211    Clinical Impression Statement Lashon continues to make progress with cervical mobility, however continues to exhibit muscle tightness/ spasm of the upper traps. She reports some relief with the dry needling and is willing to try it some more for continued relief. performed STM/ trigger point release which she reported relief and felt better following traction. she continues to demonstrate limited cervical and R shoulder mobility and strength. she reported she may try to get her low back added to he POC by her physician. plan to progress with strengthing as tolerated.    Pt will benefit from skilled therapeutic intervention in order to improve on the following deficits Pain;Improper body mechanics;Postural dysfunction;Impaired flexibility;Decreased activity tolerance;Decreased endurance;Decreased range of motion;Decreased strength;Hypomobility;Impaired UE functional  use;Decreased mobility   Rehab Potential Good   PT Frequency 2x / week   PT Duration 4 weeks  PT Treatment/Interventions ADLs/Self Care Home Management;Moist Heat;Therapeutic activities;Patient/family education;Passive range of motion;Therapeutic exercise;Ultrasound;Balance training;Manual techniques;Cryotherapy;Dry needling;Neuromuscular re-education;Electrical Stimulation;Other (comment)   PT Next Visit Plan continue reformeer as tolerated, assess dry needling and , progress with cervical mobility and strengthening    Consulted and Agree with Plan of Care Patient        Problem List Patient Active Problem List   Diagnosis Date Noted  . Smoking 12/26/2013  . Chronic lower back pain 04/13/2013  . Sciatica 04/13/2013  . Chronic neck pain 04/13/2013  . Spondylosis of cervical spine 04/13/2013  Starr Lake PT, DPT, LAT, ATC  06/12/2014  12:50 PM    [facility] [address] [place], [place], [address] Phone: [phone]   Fax:  [phone]

## 2014-06-20 ENCOUNTER — Ambulatory Visit: Payer: No Typology Code available for payment source | Attending: Specialist | Admitting: Physical Therapy

## 2014-06-20 DIAGNOSIS — M62830 Muscle spasm of back: Secondary | ICD-10-CM | POA: Insufficient documentation

## 2014-06-20 DIAGNOSIS — M25611 Stiffness of right shoulder, not elsewhere classified: Secondary | ICD-10-CM

## 2014-06-20 DIAGNOSIS — M545 Low back pain: Secondary | ICD-10-CM | POA: Insufficient documentation

## 2014-06-20 DIAGNOSIS — M542 Cervicalgia: Secondary | ICD-10-CM | POA: Insufficient documentation

## 2014-06-20 DIAGNOSIS — R29898 Other symptoms and signs involving the musculoskeletal system: Secondary | ICD-10-CM | POA: Insufficient documentation

## 2014-06-20 DIAGNOSIS — M436 Torticollis: Secondary | ICD-10-CM | POA: Insufficient documentation

## 2014-06-20 DIAGNOSIS — M7581 Other shoulder lesions, right shoulder: Secondary | ICD-10-CM | POA: Insufficient documentation

## 2014-06-20 NOTE — Therapy (Signed)
[place], [place], [address] Phone: [phone]   Fax:  [phone]  Physical Therapy Treatment  Patient Details  Name: Cheryl King MRN: [id number] Date of Birth: 1965/03/16 Referring Provider:  Lance Bosch, NP  Encounter Date: 06/20/2014      PT End of Session - 06/20/14 1354    Visit Number 11   Number of Visits 18   Date for PT Re-Evaluation 07/06/14   PT Start Time 1017   PT Stop Time 1114   PT Time Calculation (min) 57 min   Activity Tolerance Patient tolerated treatment well   Behavior During Therapy [facility] for tasks assessed/performed      Past Medical History  Diagnosis Date  . Low back pain   . Sacroiliac inflammation   . Seasonal allergies   . Diverticulitis   . Sciatica     Past Surgical History  Procedure Laterality Date  . Ankle surgery      lt.  Marland Kitchen Uretherl      uretrral diverticultis  . Abdominal hysterectomy    . Abdominal surgery    . Right breast cyst removed       There were no vitals filed for this visit.  Visit Diagnosis:  Neck pain  Decreased ROM of neck  Neck stiffness  Shoulder weakness  Decreased shoulder mobility, right      Subjective Assessment - 06/20/14 1037    Subjective I am doing better with dry needling . I am a 5/10 today.  I have low back pain that I am going to come for after we finish my neck    Limitations Sitting;Standing;Walking;Lifting;House hold activities   Diagnostic tests Neck 2015 Athritis/ bone spurs per pt report   Patient Stated Goals To be pain free   Currently in Pain? Yes   Pain Score 5    Pain Location Neck   Pain Orientation Mid;Right;Left  r >L today   Pain Descriptors / Indicators Aching;Sore   Pain Type Chronic pain   Pain Onset More than a month ago            [facility] PT Assessment - 06/20/14 0001    AROM   Cervical Flexion 45   Cervical Extension 45   Cervical - Right Side Bend 50   Cervical -  Left Side Bend 48   Strength   Right Shoulder Flexion 4/5   Right Shoulder Extension 4+/5   Right Shoulder ABduction 4-/5  pain during testing   Right Shoulder Internal Rotation 4+/5   Right Shoulder External Rotation 4+/5                     [facility] Adult PT Treatment/Exercise - 06/20/14 1023    Neck Exercises: Machines for Strengthening   UBE (Upper Arm Bike) L 1.5 x 6 min    Neck Exercises: Theraband   Scapula Retraction 10 reps   Scapula Retraction Limitations VC for chin tuck   Rows 10 reps;Red  x2 VC for shoulder setting bil   Rows Limitations VC for shoulder setting   Shoulder External Rotation 10 reps;Red  x2 with VC   Shoulder External Rotation Limitations --  bil   Moist Heat Therapy   Number Minutes Moist Heat 15 Minutes   Moist Heat Location --  neck   Manual Therapy   Joint Mobilization C-3 to C-6 R lateral PA grade 3   Myofascial Release to bil upper trap and left R>L  Trigger Point Dry Needling - 06/20/14 1042    Consent Given? Yes   Education Handout Provided --  previously given   Muscles Treated Upper Body Upper trapezius;Levator scapulae  C-3 to C-6 bil erector spinae with twitch response   Upper Trapezius Response Twitch reponse elicited;Palpable increased muscle length  bil   Levator Scapulae Response Twitch response elicited;Palpable increased muscle length  bil              PT Education - 06/20/14 1041    Education provided Yes   Education Details Pt educated and demo bil rows and shoulder ext with chin tucks with red T band   Person(s) Educated Patient   Methods Handout;Explanation;Demonstration;Verbal cues   Comprehension Verbalized understanding;Returned demonstration          PT Short Term Goals - 06/20/14 1357    PT SHORT TERM GOAL #1   Title pt will be I with Basic HEP 05/27/2014   Time 4   Period Weeks   Status Achieved   PT SHORT TERM GOAL #2   Title she will increase Cervical rotation bil by > 10  degrees to assist with safety during driving 0/03/4740   Time 4   Period Weeks   Status Achieved   PT SHORT TERM GOAL #3   Title pt will decreased neck pain to <5/10 to assist with functional exercise 05/27/2014   Time 4   Period Weeks   Status Achieved   PT SHORT TERM GOAL #4   Title pt will increase R shoulder flexion/abduction to > 4-/5 to assist with ADL's 05/27/2014   Time 4   Period Weeks   Status Achieved           PT Long Term Goals - 06/20/14 1038    PT LONG TERM GOAL #1   Title pt will be I with advanced HEP 07/06/2014   Time 8   Period Weeks   Status On-going   PT LONG TERM GOAL #2   Title pt will Increase overall cervical mobility by >20 degrees in all planes to assist with functional mobility and safety during driving 5/95/6387   Time 8   Period Weeks   Status On-going   PT LONG TERM GOAL #3   Title pt will decrease cervical pain to <3/10 to assist with ADLs, and functional exercises 07/06/2014   Time 8   Period Weeks   Status On-going   PT LONG TERM GOAL #4   Title pt will increase R shoulder strength to >4+/5 to help with ADLs 07/06/2014   Time 8   Period Weeks   Status On-going   PT LONG TERM GOAL #5   Title pt will be able to verbalize and demonstrate techniques to reduce risk or cervical reinjury by postural awareness, and HEP   Time 8   Period Days   Status Achieved               Plan - 06/20/14 1358    Clinical Impression Statement Pt has achieved STG # 4, AROM of neck has greatly improved from evaluation.  Although pain and tightness continue.  Pt notes benefit from dry needling and understands importance of continuing to strengthen.  Pt given standing  bil rows and shoulder extension with red T band to encourage proper posture with activity.  Pt understands her responsibilty in continuing to improve functional movement   Pt will benefit from skilled therapeutic intervention in order to improve on the following deficits Pain;Improper body  mechanics;Postural dysfunction;Impaired flexibility;Decreased activity tolerance;Decreased endurance;Decreased range of motion;Decreased strength;Hypomobility;Impaired UE functional use;Decreased mobility   PT Frequency 2x / week   PT Duration 4 weeks   PT Treatment/Interventions ADLs/Self Care Home Management;Moist Heat;Therapeutic activities;Patient/family education;Passive range of motion;Therapeutic exercise;Ultrasound;Balance training;Manual techniques;Cryotherapy;Dry needling;Neuromuscular re-education;Electrical Stimulation;Other (comment)   PT Next Visit Plan Continue strengthening of neck, DNF and scapular strength.  continue reformer as tolerated        Problem List Patient Active Problem List   Diagnosis Date Noted  . Smoking 12/26/2013  . Chronic lower back pain 04/13/2013  . Sciatica 04/13/2013  . Chronic neck pain 04/13/2013  . Spondylosis of cervical spine 04/13/2013    Voncille Lo, PT 06/20/2014 2:04 PM Phone: [phone] Fax: [facility] [place], [place], [address] Phone: [phone]   Fax:  [phone]

## 2014-06-20 NOTE — Patient Instructions (Signed)
EXTENSION: Standing - Resistance Band: Stable (Active)   Stand, right arm at side. Against yellow resistance band, draw arm backward, as far as possible, keeping elbow straight. Complete __2_ sets of 10___ repetitions. Perform _1-2__ sessions per day. Remember to chin tuck and set your shoulders.  No slouching or excessive movement     Copyright  VHI. All rights reserved.  Resistive Band Rowing   With resistive band anchored in door, grasp both ends. Keeping elbows bent, pull back, squeezing shoulder blades together. Hold _5___ seconds. Repeat _2 x10___ times. Do __1-2__ sessions per day.  http://gt2.exer.us/97   Copyright  VHI. All rights reserved.  Voncille Lo, PT 06/20/2014 10:34 AM Phone: [phone] Fax: [phone]

## 2014-06-26 ENCOUNTER — Ambulatory Visit: Payer: No Typology Code available for payment source | Admitting: Physical Therapy

## 2014-06-26 DIAGNOSIS — R29898 Other symptoms and signs involving the musculoskeletal system: Secondary | ICD-10-CM

## 2014-06-26 DIAGNOSIS — M545 Low back pain: Secondary | ICD-10-CM

## 2014-06-26 DIAGNOSIS — M2560 Stiffness of unspecified joint, not elsewhere classified: Secondary | ICD-10-CM

## 2014-06-26 DIAGNOSIS — M62830 Muscle spasm of back: Secondary | ICD-10-CM

## 2014-06-26 DIAGNOSIS — M2569 Stiffness of other specified joint, not elsewhere classified: Secondary | ICD-10-CM
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
## 2014-06-26 NOTE — Patient Instructions (Signed)
   Kristoffer Leamon PT, DPT, LAT, ATC  [facility] Phone: [phone]     

## 2014-06-28 ENCOUNTER — Ambulatory Visit: Payer: No Typology Code available for payment source | Admitting: Physical Therapy

## 2014-06-28 DIAGNOSIS — M545 Low back pain: Secondary | ICD-10-CM

## 2014-06-28 DIAGNOSIS — M2569 Stiffness of other specified joint, not elsewhere classified: Secondary | ICD-10-CM

## 2014-06-28 DIAGNOSIS — M436 Torticollis: Secondary | ICD-10-CM

## 2014-06-28 DIAGNOSIS — M62830 Muscle spasm of back: Secondary | ICD-10-CM

## 2014-06-28 DIAGNOSIS — R29898 Other symptoms and signs involving the musculoskeletal system: Secondary | ICD-10-CM

## 2014-06-28 DIAGNOSIS — M2560 Stiffness of unspecified joint, not elsewhere classified: Secondary | ICD-10-CM

## 2014-06-28 DIAGNOSIS — M542 Cervicalgia: Secondary | ICD-10-CM

## 2014-06-28 NOTE — Therapy (Signed)
[place], [place], [address] Phone: [phone]   Fax:  [phone]  Physical Therapy Treatment  Patient Details  Name: Khristina Janota MRN: [id number] Date of Birth: 1965-06-19 Referring Provider:  Jessy Oto, MD  Encounter Date: 06/28/2014      PT End of Session - 06/28/14 1026    Visit Number 13   Number of Visits 30   Date for PT Re-Evaluation 08/07/14   PT Start Time 0930   PT Stop Time 1030   PT Time Calculation (min) 60 min   Activity Tolerance Patient tolerated treatment well      Past Medical History  Diagnosis Date  . Low back pain   . Sacroiliac inflammation   . Seasonal allergies   . Diverticulitis   . Sciatica     Past Surgical History  Procedure Laterality Date  . Ankle surgery      lt.  Marland Kitchen Uretherl      uretrral diverticultis  . Abdominal hysterectomy    . Abdominal surgery    . Right breast cyst removed       There were no vitals filed for this visit.  Visit Diagnosis:  Bilateral low back pain, with sciatica presence unspecified  Weakness of both legs  Decreased ROM of trunk and back  Muscle spasm of back  Neck pain  Decreased ROM of neck  Neck stiffness      Subjective Assessment - 06/28/14 0935    Subjective (p) Neck 4/10.  7/10 in low back.  No body has been working on my back because I just got the referral   Currently in Pain? (p) Yes   Pain Score (p) 7    Pain Location (p) Back   Pain Orientation (p) Right   Pain Radiating Towards (p) Buttocks and right lower extremity   Aggravating Factors  (p) reaching   Pain Relieving Factors (p) changing position   Pain Score (p) 4   Pain Location (p) Neck                         [facility] Adult PT Treatment/Exercise - 06/28/14 1021    Lumbar Exercises: Supine   Ab Set 10 reps   Isometric Hip Flexion 10 reps   Other Supine Lumbar Exercises Ab brace with knee lift and lower 10x   Moist  Heat Therapy   Number Minutes Moist Heat 10 Minutes   Moist Heat Location Lumbar Spine;Cervical  prone   Manual Therapy   Joint Mobilization R/L hip long axis distraction;inferior mobs,A-P in IR; right lumbar neutral gapping grade 3/4 3x 20 sec each   Soft tissue mobilization gluteals, piriformis, lumbar multifidi bilaterally   Myofascial Release gluteal/piriformis contract/relax 3x 5 sec hold right/left          Trigger Point Dry Needling - 06/28/14 1023    Consent Given? Yes   Muscles Treated Upper Body Upper trapezius   Muscles Treated Lower Body Gluteus maximus;Piriformis  bilateral lumbar multifidi   Gluteus Maximus Response Twitch response elicited;Palpable increased muscle length   Piriformis Response Twitch response elicited;Palpable increased muscle length       Performed bilaterally.       PT Education - 06/28/14 1025    Education provided Yes   Education Details Supine abdominal brace series   Person(s) Educated Patient   Methods Explanation;Demonstration;Handout   Comprehension Verbalized understanding;Returned demonstration          PT  Short Term Goals - 06/28/14 1039    PT SHORT TERM GOAL #1   Title pt will be I with Basic HEP 05/27/2014   Status Achieved   PT SHORT TERM GOAL #2   Title she will increase Cervical rotation bil by > 10 degrees to assist with safety during driving 09/21/7167   Status Achieved   PT SHORT TERM GOAL #3   Title pt will decreased neck pain to <5/10 to assist with functional exercise 05/27/2014   Status Achieved   PT SHORT TERM GOAL #4   Title pt will increase R shoulder flexion/abduction to > 4-/5 to assist with ADL's 05/27/2014   Status Achieved   PT SHORT TERM GOAL #5   Title (low back) pt will decrease low back pain to < 5/10 during and following standing and walking for > 5-10 min to assist with increased funcitonal endurance ( 07/17/2014)   Time 3   Period Weeks   Status On-going           PT Long Term Goals -  06/28/14 1040    PT LONG TERM GOAL #1   Title pt will be I with advanced HEP 07/06/2014   Time 8   Period Weeks   Status On-going   PT LONG TERM GOAL #2   Title pt will Increase overall cervical mobility by >20 degrees in all planes to assist with functional mobility and safety during driving 6/78/9381   Time 8   Period Weeks   Status On-going   PT LONG TERM GOAL #3   Title pt will decrease cervical pain to <3/10 to assist with ADLs, and functional exercises 07/06/2014   Time 8   Period Weeks   Status On-going   PT LONG TERM GOAL #4   Title pt will increase R shoulder strength to >4+/5 to help with ADLs 07/06/2014   Time 8   Period Weeks   Status On-going   PT LONG TERM GOAL #5   Title pt will be able to verbalize and demonstrate techniques to reduce risk or cervical reinjury by postural awareness, and HEP   Status Achieved   PT LONG TERM GOAL #6   Title (low back) pt will increase trunk mobility to [facility] to assist with  ADLS (0/17/5102)   Time 6   Period Weeks   Status On-going   PT LONG TERM GOAL #7   Title (low back) pt will be able to tolerate walking/ standing for > 30 minutes and/or >10 steps to assist with funcitonal endurance and community amb (08/07/2014)   Time 6   Period Weeks   Status On-going   PT LONG TERM GOAL #8   Title (low back) pt will increae bil LE strength to > 4+/5 to assist with ADLs including shopping and traveling (08/07/2014)   Time 6   Period Weeks   Status On-going   PT LONG TERM GOAL  #9   TITLE (low back) she will be able to verbalize and demonstrate techniques to reduce low back pain via postural awareness, lifting and carrying mechanics, and HEp (08/07/2014)   Time 6   Period Weeks   Status On-going               Plan - 06/28/14 1026    Clinical Impression Statement The patient reports continued neck pain which she states has responded favorably to dry needling in this area.  Her back pain is a 7/10 since "nothing has been done to it  yet."  She has improved muscle length noted at end of treatment session although right  LE less mobile than left.  Receptive to instruction on abdominal bracing with minimal cues needed for technique.  Continue with treatment plan.   PT Next Visit Plan assess response to dry needling and manual techniques for lumbar; lumbar and hip joint mobs; progress core strengthening with bird dogs or prone multifidi        Problem List Patient Active Problem List   Diagnosis Date Noted  . Smoking 12/26/2013  . Chronic lower back pain 04/13/2013  . Sciatica 04/13/2013  . Chronic neck pain 04/13/2013  . Spondylosis of cervical spine 04/13/2013    Alvera Singh 06/28/2014, 10:45 AM  [facility] [address] [place], [place], [address] Phone: [phone]   Fax:  [phone]  Ruben Im, PT 06/28/2014 10:46 AM Phone: [phone] Fax: [phone]

## 2014-06-28 NOTE — Patient Instructions (Signed)
Supine abdominal brace series 10 each.  See handouts.

## 2014-07-01 ENCOUNTER — Ambulatory Visit: Payer: No Typology Code available for payment source

## 2014-07-01 DIAGNOSIS — M545 Low back pain: Secondary | ICD-10-CM

## 2014-07-01 DIAGNOSIS — M62830 Muscle spasm of back: Secondary | ICD-10-CM

## 2014-07-01 DIAGNOSIS — M542 Cervicalgia: Secondary | ICD-10-CM

## 2014-07-01 NOTE — Therapy (Signed)
[place], [place], [address] Phone: [phone]   Fax:  [phone]  Physical Therapy Treatment  Patient Details  Name: Cheryl King MRN: [id number] Date of Birth: 12/05/1965 Referring Provider:  Jessy Oto, MD  Encounter Date: 07/01/2014      PT End of Session - 07/01/14 1159    Visit Number 14   Number of Visits 30   Date for PT Re-Evaluation 08/07/14   PT Start Time 1105   PT Stop Time 1208   PT Time Calculation (min) 63 min   Activity Tolerance Patient tolerated treatment well   Behavior During Therapy [facility] for tasks assessed/performed      Past Medical History  Diagnosis Date  . Low back pain   . Sacroiliac inflammation   . Seasonal allergies   . Diverticulitis   . Sciatica     Past Surgical History  Procedure Laterality Date  . Ankle surgery      lt.  Marland Kitchen Uretherl      uretrral diverticultis  . Abdominal hysterectomy    . Abdominal surgery    . Right breast cyst removed       There were no vitals filed for this visit.  Visit Diagnosis:  Bilateral low back pain, with sciatica presence unspecified  Muscle spasm of back  Neck pain      Subjective Assessment - 07/01/14 1109    Subjective Neck and back pain.    Currently in Pain? Yes   Pain Score 4   7/10 back,    Pain Location Neck                         [facility] Adult PT Treatment/Exercise - 07/01/14 0001    Neck Exercises: Machines for Strengthening   UBE (Upper Arm Bike) L1.5 3 mn for and 3 min back   Neck Exercises: Theraband   Shoulder External Rotation 10 reps;Red  2 sets   Horizontal ABduction 10 reps;Red  2 reps    Lumbar Exercises: Stretches   Piriformis Stretch 1 rep;30 seconds  RT and LT   Lumbar Exercises: Prone   Other Prone Lumbar Exercises quadrped cat camel and arm leg opposite lift x 10 each  and child's pose 60 sec   Moist Heat Therapy   Number Minutes Moist Heat 15 Minutes    Moist Heat Location Lumbar Spine;Cervical   Traction   Type of Traction Cervical   Min (lbs) 5   Max (lbs) 18   Hold Time 60   Rest Time 15   Time 15                  PT Short Term Goals - 06/28/14 1039    PT SHORT TERM GOAL #1   Title pt will be I with Basic HEP 05/27/2014   Status Achieved   PT SHORT TERM GOAL #2   Title she will increase Cervical rotation bil by > 10 degrees to assist with safety during driving 06/23/4401   Status Achieved   PT SHORT TERM GOAL #3   Title pt will decreased neck pain to <5/10 to assist with functional exercise 05/27/2014   Status Achieved   PT SHORT TERM GOAL #4   Title pt will increase R shoulder flexion/abduction to > 4-/5 to assist with ADL's 05/27/2014   Status Achieved   PT SHORT TERM GOAL #5   Title (low back) pt will decrease low back pain  to < 5/10 during and following standing and walking for > 5-10 min to assist with increased funcitonal endurance ( 07/17/2014)   Time 3   Period Weeks   Status On-going           PT Long Term Goals - 06/28/14 1040    PT LONG TERM GOAL #1   Title pt will be I with advanced HEP 07/06/2014   Time 8   Period Weeks   Status On-going   PT LONG TERM GOAL #2   Title pt will Increase overall cervical mobility by >20 degrees in all planes to assist with functional mobility and safety during driving 1/61/0960   Time 8   Period Weeks   Status On-going   PT LONG TERM GOAL #3   Title pt will decrease cervical pain to <3/10 to assist with ADLs, and functional exercises 07/06/2014   Time 8   Period Weeks   Status On-going   PT LONG TERM GOAL #4   Title pt will increase R shoulder strength to >4+/5 to help with ADLs 07/06/2014   Time 8   Period Weeks   Status On-going   PT LONG TERM GOAL #5   Title pt will be able to verbalize and demonstrate techniques to reduce risk or cervical reinjury by postural awareness, and HEP   Status Achieved   PT LONG TERM GOAL #6   Title (low back) pt will increase  trunk mobility to [facility] to assist with  ADLS (4/54/0981)   Time 6   Period Weeks   Status On-going   PT LONG TERM GOAL #7   Title (low back) pt will be able to tolerate walking/ standing for > 30 minutes and/or >10 steps to assist with funcitonal endurance and community amb (08/07/2014)   Time 6   Period Weeks   Status On-going   PT LONG TERM GOAL #8   Title (low back) pt will increae bil LE strength to > 4+/5 to assist with ADLs including shopping and traveling (08/07/2014)   Time 6   Period Weeks   Status On-going   PT LONG TERM GOAL  #9   TITLE (low back) she will be able to verbalize and demonstrate techniques to reduce low back pain via postural awareness, lifting and carrying mechanics, and HEp (08/07/2014)   Time 6   Period Weeks   Status On-going               Plan - 07/01/14 1200    Clinical Impression Statement Her pain levels continued unchanged. She tolerated all exercise and demos excellant LE and spine mobiliity.    PT Next Visit Plan Continued core stability and modalities per plan. Continue dry needling as helpful. Add quadraped exercises next visit   Consulted and Agree with Plan of Care Patient        Problem List Patient Active Problem List   Diagnosis Date Noted  . Smoking 12/26/2013  . Chronic lower back pain 04/13/2013  . Sciatica 04/13/2013  . Chronic neck pain 04/13/2013  . Spondylosis of cervical spine 04/13/2013    Darrel Hoover PT 07/01/2014, 12:04 PM  [facility] [address] [place], [place], [address] Phone: [phone]   Fax:  [phone]

## 2014-07-03 ENCOUNTER — Encounter: Payer: No Typology Code available for payment source | Admitting: Physical Therapy

## 2014-07-04 ENCOUNTER — Ambulatory Visit: Payer: No Typology Code available for payment source | Admitting: Physical Therapy

## 2014-07-04 DIAGNOSIS — M436 Torticollis: Secondary | ICD-10-CM

## 2014-07-04 DIAGNOSIS — M542 Cervicalgia: Secondary | ICD-10-CM

## 2014-07-04 DIAGNOSIS — M2569 Stiffness of other specified joint, not elsewhere classified: Secondary | ICD-10-CM

## 2014-07-04 DIAGNOSIS — M545 Low back pain: Secondary | ICD-10-CM

## 2014-07-04 DIAGNOSIS — R29898 Other symptoms and signs involving the musculoskeletal system: Secondary | ICD-10-CM

## 2014-07-04 DIAGNOSIS — M62830 Muscle spasm of back: Secondary | ICD-10-CM

## 2014-07-04 DIAGNOSIS — M2560 Stiffness of unspecified joint, not elsewhere classified: Secondary | ICD-10-CM

## 2014-07-04 NOTE — Therapy (Signed)
[place], [place], [address] Phone: [phone]   Fax:  [phone]  Physical Therapy Treatment  Patient Details  Name: Cheryl King MRN: [id number] Date of Birth: 1966-01-13 Referring Provider:  Lance Bosch, NP  Encounter Date: 07/04/2014      PT End of Session - 07/04/14 1243    Visit Number 15   Number of Visits 30   Date for PT Re-Evaluation 08/07/14   PT Start Time 0847   PT Stop Time 0947   PT Time Calculation (min) 60 min   Activity Tolerance Patient tolerated treatment well   Behavior During Therapy [facility] for tasks assessed/performed      Past Medical History  Diagnosis Date  . Low back pain   . Sacroiliac inflammation   . Seasonal allergies   . Diverticulitis   . Sciatica     Past Surgical History  Procedure Laterality Date  . Ankle surgery      lt.  Marland Kitchen Uretherl      uretrral diverticultis  . Abdominal hysterectomy    . Abdominal surgery    . Right breast cyst removed       There were no vitals filed for this visit.  Visit Diagnosis:  Bilateral low back pain, with sciatica presence unspecified  Muscle spasm of back  Neck pain  Weakness of both legs  Decreased ROM of trunk and back  Decreased ROM of neck  Neck stiffness  Shoulder weakness      Subjective Assessment - 07/04/14 0859    Subjective I have had an upper respiratory cold this week and have not done my exercises.  I am a 4/10 in my neck and 6-7/10 in my back.  Dry needling helps   Limitations Sitting;Standing;Walking;Lifting;House hold activities   How long can you walk comfortably? can stand 30 - 60 minutes while leaning on a cart   Diagnostic tests Neck 2015 Athritis/ bone spurs per pt report   Patient Stated Goals To be pain free   Currently in Pain? Yes   Pain Score 4    Pain Location Neck   Pain Orientation Right;Mid   Pain Descriptors / Indicators Aching;Sore   Pain Type Chronic pain   Pain Onset More than a month ago   Pain Score 7   Pain Location Back   Pain Orientation Mid;Lower;Right            [facility] PT Assessment - 07/04/14 0850    AROM   Cervical Flexion 65   Cervical Extension 45   Cervical - Right Side Bend 60   Cervical - Left Side Bend 55                     [facility] Adult PT Treatment/Exercise - 07/04/14 0907    Neck Exercises: Machines for Strengthening   UBE (Upper Arm Bike) L2 3 mn for and 3 min back   Lumbar Exercises: Stretches   Quadruped Mid Back Stretch 2 reps;30 seconds   Quadruped Mid Back Stretch Limitations relief of pain post dry needling   Lumbar Exercises: Quadruped   [place]/Old Horse 10 reps   Madcat/Old Horse Limitations with towel roll between  knees tightening pelvic    Single Arm Raise 10 reps;Right;Left;5 seconds;3 seconds   Straight Leg Raise 10 reps;3 seconds  right and then left   Opposite Arm/Leg Raise 10 reps;Right arm/Left leg;Left arm/Right leg;3 seconds   Other Quadruped Lumbar Exercises quadriped with hip abd right  and left 10 x    Moist Heat Therapy   Number Minutes Moist Heat 15 Minutes   Moist Heat Location Lumbar Spine;Cervical   Manual Therapy   Joint Mobilization R/L hip long axis distraction;inferior mobs,A-P in IR; right lumbar neutral gapping grade 3/4 3x 20 sec each   Soft tissue mobilization gluteals, piriformis, lumbar multifidi bilaterally   Myofascial Release gluteal/piriformis contract/relax 3x 5 sec hold right/left          Trigger Point Dry Needling - 07/04/14 0909    Consent Given? Yes   Muscles Treated Lower Body Gluteus maximus  bilateral multifidi, marked response   Gluteus Maximus Response Twitch response elicited;Palpable increased muscle length  bilateral multifidi   Piriformis Response Twitch response elicited;Palpable increased muscle length              PT Education - 07/04/14 1242    Education provided Yes   Education Details Pt instructed and demonstrated  quadriped exercises with VC and TC   Person(s) Educated Patient   Methods Explanation;Demonstration;Verbal cues;Handout   Comprehension Verbalized understanding;Returned demonstration          PT Short Term Goals - 06/28/14 1039    PT SHORT TERM GOAL #1   Title pt will be I with Basic HEP 05/27/2014   Status Achieved   PT SHORT TERM GOAL #2   Title she will increase Cervical rotation bil by > 10 degrees to assist with safety during driving 0/0/1749   Status Achieved   PT SHORT TERM GOAL #3   Title pt will decreased neck pain to <5/10 to assist with functional exercise 05/27/2014   Status Achieved   PT SHORT TERM GOAL #4   Title pt will increase R shoulder flexion/abduction to > 4-/5 to assist with ADL's 05/27/2014   Status Achieved   PT SHORT TERM GOAL #5   Title (low back) pt will decrease low back pain to < 5/10 during and following standing and walking for > 5-10 min to assist with increased funcitonal endurance ( 07/17/2014)   Time 3   Period Weeks   Status On-going           PT Long Term Goals - 07/04/14 0916    PT LONG TERM GOAL #1   Title pt will be I with advanced HEP 07/06/2014   Baseline given quadriped ex   Time 8   Period Weeks   Status On-going   PT LONG TERM GOAL #2   Title pt will Increase overall cervical mobility by >20 degrees in all planes to assist with functional mobility and safety during driving 4/49/6759   Baseline see AROM cervical WNL   Period Weeks   Status Achieved   PT LONG TERM GOAL #3   Title pt will decrease cervical pain to <3/10 to assist with ADLs, and functional exercises 07/06/2014   Baseline pt pain 4/10 today   Time 8   Period Weeks   Status On-going   PT LONG TERM GOAL #4   Title pt will increase R shoulder strength to >4+/5 to help with ADLs 07/06/2014   Time 8   Period Weeks   Status On-going   PT LONG TERM GOAL #5   Title pt will be able to verbalize and demonstrate techniques to reduce risk or cervical reinjury by postural  awareness, and HEP   Time 8   Status Achieved   PT LONG TERM GOAL #6   Title (low back) pt will increase trunk mobility to [facility]  to assist with  ADLS (9/41/7408)   Baseline must lean over to pull pants up pain at 7/10    Time 6   Period Weeks   Status On-going   PT LONG TERM GOAL #7   Title (low back) pt will be able to tolerate walking/ standing for > 30 minutes and/or >10 steps to assist with funcitonal endurance and community amb (08/07/2014)   Baseline able to stand for 30 minutes but avoid steps   Time 6   Period Weeks   Status On-going   PT LONG TERM GOAL #8   Title (low back) pt will increae bil LE strength to > 4+/5 to assist with ADLs including shopping and traveling (08/07/2014)   Time 6   Period Weeks   Status On-going   PT LONG TERM GOAL  #9   TITLE (low back) she will be able to verbalize and demonstrate techniques to reduce low back pain via postural awareness, lifting and carrying mechanics, and HEp (08/07/2014)   Period Weeks   Status On-going               Plan - 07/04/14 0912    Clinical Impression Statement Pain levels similar to last time, neck 4/10, back 6-7/10,  she claims dry needling beneficial. given quadriped ex.  Neck muscles improving with intermittent pain and  cervical AROM improved WNL,( SEE measurements taken)  . Pt  would benefit from improved core conditioning and begin PT for steps due to pt  anxious about negotiating steps due to pain.     Pt will benefit from skilled therapeutic intervention in order to improve on the following deficits Pain;Improper body mechanics;Postural dysfunction;Impaired flexibility;Decreased activity tolerance;Decreased endurance;Decreased range of motion;Decreased strength;Hypomobility;Impaired UE functional use;Decreased mobility   Rehab Potential Good   PT Frequency 2x / week   PT Duration 4 weeks   PT Next Visit Plan continued core stability and perform steps next visit.  Assess benefit of PT/dry needling and do  lumbar goals.  FOTO?   PT Home Exercise Plan quadriped added for HEP   Consulted and Agree with Plan of Care Patient        Problem List Patient Active Problem List   Diagnosis Date Noted  . Smoking 12/26/2013  . Chronic lower back pain 04/13/2013  . Sciatica 04/13/2013  . Chronic neck pain 04/13/2013  . Spondylosis of cervical spine 04/13/2013  Voncille Lo, PT 07/04/2014 12:55 PM Phone: [phone] Fax: [place] [facility] [address] [place], [place], [address] Phone: [phone]   Fax:  [phone]

## 2014-07-04 NOTE — Patient Instructions (Signed)
Combination (Quadruped)  On hands and knees with towel roll between knees, slowly inhale, and then exhale. Pull navel toward spine, squeeze roll with knees, and tighten pelvic floor. Hold for __5_ seconds. Rest for _3__ seconds. Repeat __10-15_ times. Do _1-2__ times a day.  Bracing With Arm Raise (Quadruped)  On hands and knees find neutral spine. Tighten pelvic floor and abdominals and hold. Alternately lift arm to shoulder level. Repeat 10-15___ times. Do _1-2__ times a day.  Quadruped Alternate Hip Extension   Shift weight to one side and raise opposite leg. Keep trunk steady. __10-15_ reps per set, _1__ sets per day, __7_ days per week Repeat with other leg.  Bracing With Arm / Leg Raise (Quadruped)  On hands and knees find neutral spine. Tighten pelvic floor and abdominals and hold. Alternating, lift arm to shoulder level and opposite leg to hip level. Repeat _10-15__ times. Do _1-2__ times a day.  Quadruped: Hip Abduction / Knee Flexion (Active)   On hands and knees, lift right bent knee out to the side.  Complete __1_ sets of _10-15__ repetitions. Perform _1-2__ sessions per day.  Copyright  VHI. All rights reserved.   Voncille Lo, PT 07/04/2014 8:58 AM Phone: [phone] Fax: [phone]

## 2014-07-08 ENCOUNTER — Ambulatory Visit: Payer: No Typology Code available for payment source | Admitting: Physical Therapy

## 2014-07-08 DIAGNOSIS — M62830 Muscle spasm of back: Secondary | ICD-10-CM

## 2014-07-08 DIAGNOSIS — M2560 Stiffness of unspecified joint, not elsewhere classified: Secondary | ICD-10-CM

## 2014-07-08 DIAGNOSIS — R29898 Other symptoms and signs involving the musculoskeletal system: Secondary | ICD-10-CM

## 2014-07-08 DIAGNOSIS — M545 Low back pain: Secondary | ICD-10-CM

## 2014-07-08 DIAGNOSIS — M2569 Stiffness of other specified joint, not elsewhere classified: Secondary | ICD-10-CM

## 2014-07-08 DIAGNOSIS — M542 Cervicalgia: Secondary | ICD-10-CM

## 2014-07-08 NOTE — Therapy (Signed)
[place], [place], [address] Phone: [phone]   Fax:  [phone]  Physical Therapy Treatment  Patient Details  Name: Cheryl King MRN: [id number] Date of Birth: September 07, 1965 Referring Provider:  Lance Bosch, NP  Encounter Date: 07/08/2014      PT End of Session - 07/08/14 1108    Visit Number 16   Number of Visits 30   Date for PT Re-Evaluation 08/07/14   PT Start Time 1100   PT Stop Time 1158   PT Time Calculation (min) 58 min   Activity Tolerance Patient tolerated treatment well   Behavior During Therapy [facility] for tasks assessed/performed      Past Medical History  Diagnosis Date  . Low back pain   . Sacroiliac inflammation   . Seasonal allergies   . Diverticulitis   . Sciatica     Past Surgical History  Procedure Laterality Date  . Ankle surgery      lt.  Marland Kitchen Uretherl      uretrral diverticultis  . Abdominal hysterectomy    . Abdominal surgery    . Right breast cyst removed       There were no vitals filed for this visit.  Visit Diagnosis:  Bilateral low back pain, with sciatica presence unspecified  Neck pain  Muscle spasm of back  Decreased ROM of trunk and back  Decreased ROM of neck      Subjective Assessment - 07/08/14 1106    Subjective " I am doing ok, I was a little sore from the needling from the last visit but I am doing ok"   Currently in Pain? Yes   Pain Score 5    Pain Location Neck   Pain Orientation Right;Mid   Pain Descriptors / Indicators Aching   Pain Type Chronic pain   Pain Onset More than a month ago   Pain Frequency Constant   Pain Score 7   Pain Location Back   Pain Orientation Mid;Lower;Right   Pain Descriptors / Indicators Aching   Pain Type Chronic pain   Pain Onset More than a month ago   Pain Frequency Constant                         [facility] Adult PT Treatment/Exercise - 07/08/14 0001    Neck Exercises:  Machines for Strengthening   UBE (Upper Arm Bike) L2 x 8 min  alt dir every 2 min   Lumbar Exercises: Stretches   Passive Hamstring Stretch 2 reps;30 seconds   Single Knee to Chest Stretch 2 reps;30 seconds   Lower Trunk Rotation 5 reps;20 seconds   Quadruped Mid Back Stretch 2 reps;30 seconds   Piriformis Stretch 2 reps;30 seconds   Lumbar Exercises: Supine   Other Supine Lumbar Exercises Ab brace with knee lift and lower 10x   Manual Therapy   Joint Mobilization lumbar L1-L5 Grade 2-3 P/A   Soft tissue mobilization gluteals, piriformis, lumbar multifidi bilaterally    Myofascial Release bil lumbar paraspinals and multifidi instrument assisted STM                 PT Education - 07/08/14 1246    Education provided Yes   Education Details HEP review   Person(s) Educated Patient   Methods Explanation   Comprehension Verbalized understanding          PT Short Term Goals - 07/08/14 1250    PT SHORT TERM GOAL #  1   Title pt will be I with Basic HEP 05/27/2014   Time 4   Period Weeks   Status Achieved   PT SHORT TERM GOAL #2   Title she will increase Cervical rotation bil by > 10 degrees to assist with safety during driving 07/26/2954   Time 4   Period Weeks   Status Achieved   PT SHORT TERM GOAL #3   Title pt will decreased neck pain to <5/10 to assist with functional exercise 05/27/2014   Time 4   Period Weeks   Status Achieved   PT SHORT TERM GOAL #4   Title pt will increase R shoulder flexion/abduction to > 4-/5 to assist with ADL's 05/27/2014   Time 4   Period Weeks   Status Achieved   PT SHORT TERM GOAL #5   Title (low back) pt will decrease low back pain to < 5/10 during and following standing and walking for > 5-10 min to assist with increased funcitonal endurance ( 07/17/2014)   Time 3   Period Weeks   Status On-going   PT SHORT TERM GOAL #6   Title (low back) She wil lincreaes trunk mobility by > 8 degrees to assist with ADLs (07/17/2014)   Baseline 3    Period Weeks   Status On-going   PT SHORT TERM GOAL #7   Title (low back) pt will tolerated trunk mobility in all planes with no radiation of symtoms below bil glutes to assist with funcitonal progress (07/17/2014)   Time 3   Period Weeks   Status On-going           PT Long Term Goals - 07/08/14 1250    PT LONG TERM GOAL #1   Title pt will be I with advanced HEP 07/06/2014   Baseline given quadriped ex   Time 8   Period Weeks   Status On-going   PT LONG TERM GOAL #2   Title pt will Increase overall cervical mobility by >20 degrees in all planes to assist with functional mobility and safety during driving 03/03/863   Baseline see AROM cervical WNL   Time 8   Period Weeks   Status Achieved   PT LONG TERM GOAL #3   Title pt will decrease cervical pain to <3/10 to assist with ADLs, and functional exercises 07/06/2014   Baseline pt pain 4/10 today   Time 8   Period Weeks   Status On-going   PT LONG TERM GOAL #4   Title pt will increase R shoulder strength to >4+/5 to help with ADLs 07/06/2014   Time 8   Period Weeks   Status On-going   PT LONG TERM GOAL #5   Title pt will be able to verbalize and demonstrate techniques to reduce risk or cervical reinjury by postural awareness, and HEP   Time 8   Period Days   Status Achieved   PT LONG TERM GOAL #6   Title (low back) pt will increase trunk mobility to [facility] to assist with  ADLS (7/84/6962)   Baseline must lean over to pull pants up pain at 7/10    Time 6   Period Weeks   Status On-going   PT LONG TERM GOAL #7   Title (low back) pt will be able to tolerate walking/ standing for > 30 minutes and/or >10 steps to assist with funcitonal endurance and community amb (08/07/2014)   Baseline able to stand for 30 minutes but avoid steps   Time 6  Period Weeks   Status On-going   PT LONG TERM GOAL #8   Title (low back) pt will increae bil LE strength to > 4+/5 to assist with ADLs including shopping and traveling (08/07/2014)   Time 6    Period Weeks   Status On-going   PT LONG TERM GOAL  #9   TITLE (low back) she will be able to verbalize and demonstrate techniques to reduce low back pain via postural awareness, lifting and carrying mechanics, and HEp (08/07/2014)   Time 6   Period Weeks   Status On-going               Plan - 07/08/14 1247    Clinical Impression Statement Myonna presents to therapy today with report of neck pain at 4/10, and back at 7/10. Focused todays treatment on instrumented assisted STM/DTM of bil lumbar paraspinals and  and gluteal/piriformis. She reported some decrease in pain following treatment.    PT Next Visit Plan continued core stability and perform steps next visit.  Assess benefit of PT/dry needling and do lumbar goals.  FOTO?   PT Home Exercise Plan quadriped added for HEP   Consulted and Agree with Plan of Care Patient        Problem List Patient Active Problem List   Diagnosis Date Noted  . Smoking 12/26/2013  . Chronic lower back pain 04/13/2013  . Sciatica 04/13/2013  . Chronic neck pain 04/13/2013  . Spondylosis of cervical spine 04/13/2013   Starr Lake PT, DPT, LAT, ATC  07/08/2014  12:56 PM    [facility] [address] [place], [place], [address] Phone: [phone]   Fax:  [phone]

## 2014-07-10 ENCOUNTER — Ambulatory Visit: Payer: No Typology Code available for payment source | Admitting: Physical Therapy

## 2014-07-10 DIAGNOSIS — R29898 Other symptoms and signs involving the musculoskeletal system: Secondary | ICD-10-CM

## 2014-07-10 DIAGNOSIS — M545 Low back pain: Secondary | ICD-10-CM

## 2014-07-10 DIAGNOSIS — M2560 Stiffness of unspecified joint, not elsewhere classified: Secondary | ICD-10-CM

## 2014-07-10 DIAGNOSIS — M542 Cervicalgia: Secondary | ICD-10-CM

## 2014-07-10 DIAGNOSIS — M2569 Stiffness of other specified joint, not elsewhere classified: Secondary | ICD-10-CM

## 2014-07-10 DIAGNOSIS — M62830 Muscle spasm of back: Secondary | ICD-10-CM

## 2014-07-10 NOTE — Therapy (Signed)
[place], [place], [address] Phone: [phone]   Fax:  [phone]  Physical Therapy Treatment  Patient Details  Name: Cheryl King MRN: [id number] Date of Birth: October 29, 1965 Referring Provider:  Lance Bosch, NP  Encounter Date: 07/10/2014      PT End of Session - 07/10/14 1016    Visit Number 17   Number of Visits 30   Date for PT Re-Evaluation 08/07/14   PT Start Time 0930   PT Stop Time 1028   PT Time Calculation (min) 58 min   Activity Tolerance Patient tolerated treatment well   Behavior During Therapy [facility] for tasks assessed/performed      Past Medical History  Diagnosis Date  . Low back pain   . Sacroiliac inflammation   . Seasonal allergies   . Diverticulitis   . Sciatica     Past Surgical History  Procedure Laterality Date  . Ankle surgery      lt.  Marland Kitchen Uretherl      uretrral diverticultis  . Abdominal hysterectomy    . Abdominal surgery    . Right breast cyst removed       There were no vitals filed for this visit.  Visit Diagnosis:  Bilateral low back pain, with sciatica presence unspecified  Muscle spasm of back  Decreased ROM of trunk and back  Shoulder weakness  Neck pain      Subjective Assessment - 07/10/14 0935    Subjective " I am having increased pain today and feel it going down my right leg into the lower glute"   Currently in Pain? Yes   Pain Score 5    Pain Location Neck   Pain Orientation Right;Mid   Pain Descriptors / Indicators Aching   Pain Type Chronic pain   Pain Onset More than a month ago   Pain Frequency Constant   Pain Score 8   Pain Location Back   Pain Orientation Right;Mid;Lower   Pain Descriptors / Indicators Aching   Pain Type Chronic pain   Pain Radiating Towards into R lower gluteal area.   Pain Onset More than a month ago   Pain Frequency Constant                         [facility] Adult PT  Treatment/Exercise - 07/10/14 0001    Lumbar Exercises: Stretches   Passive Hamstring Stretch 2 reps;30 seconds   Single Knee to Chest Stretch 2 reps;30 seconds   Lower Trunk Rotation 5 reps;20 seconds   Quadruped Mid Back Stretch 2 reps;30 seconds   Piriformis Stretch 2 reps;30 seconds   Lumbar Exercises: Aerobic   Stationary Bike Nu Step L5 x 9 min   Lumbar Exercises: Prone   Other Prone Lumbar Exercises quadrped cat camel and arm leg opposite lift x 10 each  and child's pose 60 sec   Lumbar Exercises: Quadruped   Other Quadruped Lumbar Exercises abdominal draw in manuever with alt leg kicks x 10  VC for pulling belly button in    Moist Heat Therapy   Number Minutes Moist Heat 10 Minutes   Moist Heat Location Lumbar Spine   Electrical Stimulation   Electrical Stimulation Location bil parapsinals   Electrical Stimulation Action IFC   Electrical Stimulation Parameters 10 min, 100% scan to tolerance   Manual Therapy   Joint Mobilization lumbar L1-L5 Grade 2-3 P/A   Soft tissue mobilization gluteals, piriformis, lumbar multifidi bilaterally  Myofascial Release bil lumbar paraspinals and multifidi instrument assisted STM , piriformis tack and stretch                  PT Short Term Goals - 07/08/14 1250    PT SHORT TERM GOAL #1   Title pt will be I with Basic HEP 05/27/2014   Time 4   Period Weeks   Status Achieved   PT SHORT TERM GOAL #2   Title she will increase Cervical rotation bil by > 10 degrees to assist with safety during driving 04/24/5679   Time 4   Period Weeks   Status Achieved   PT SHORT TERM GOAL #3   Title pt will decreased neck pain to <5/10 to assist with functional exercise 05/27/2014   Time 4   Period Weeks   Status Achieved   PT SHORT TERM GOAL #4   Title pt will increase R shoulder flexion/abduction to > 4-/5 to assist with ADL's 05/27/2014   Time 4   Period Weeks   Status Achieved   PT SHORT TERM GOAL #5   Title (low back) pt will decrease low  back pain to < 5/10 during and following standing and walking for > 5-10 min to assist with increased funcitonal endurance ( 07/17/2014)   Time 3   Period Weeks   Status On-going   PT SHORT TERM GOAL #6   Title (low back) She wil lincreaes trunk mobility by > 8 degrees to assist with ADLs (07/17/2014)   Baseline 3   Period Weeks   Status On-going   PT SHORT TERM GOAL #7   Title (low back) pt will tolerated trunk mobility in all planes with no radiation of symtoms below bil glutes to assist with funcitonal progress (07/17/2014)   Time 3   Period Weeks   Status On-going           PT Long Term Goals - 07/08/14 1250    PT LONG TERM GOAL #1   Title pt will be I with advanced HEP 07/06/2014   Baseline given quadriped ex   Time 8   Period Weeks   Status On-going   PT LONG TERM GOAL #2   Title pt will Increase overall cervical mobility by >20 degrees in all planes to assist with functional mobility and safety during driving 2/75/1700   Baseline see AROM cervical WNL   Time 8   Period Weeks   Status Achieved   PT LONG TERM GOAL #3   Title pt will decrease cervical pain to <3/10 to assist with ADLs, and functional exercises 07/06/2014   Baseline pt pain 4/10 today   Time 8   Period Weeks   Status On-going   PT LONG TERM GOAL #4   Title pt will increase R shoulder strength to >4+/5 to help with ADLs 07/06/2014   Time 8   Period Weeks   Status On-going   PT LONG TERM GOAL #5   Title pt will be able to verbalize and demonstrate techniques to reduce risk or cervical reinjury by postural awareness, and HEP   Time 8   Period Days   Status Achieved   PT LONG TERM GOAL #6   Title (low back) pt will increase trunk mobility to [facility] to assist with  ADLS (1/74/9449)   Baseline must lean over to pull pants up pain at 7/10    Time 6   Period Weeks   Status On-going   PT LONG TERM GOAL #7  Title (low back) pt will be able to tolerate walking/ standing for > 30 minutes and/or >10 steps to  assist with funcitonal endurance and community amb (08/07/2014)   Baseline able to stand for 30 minutes but avoid steps   Time 6   Period Weeks   Status On-going   PT LONG TERM GOAL #8   Title (low back) pt will increae bil LE strength to > 4+/5 to assist with ADLs including shopping and traveling (08/07/2014)   Time 6   Period Weeks   Status On-going   PT LONG TERM GOAL  #9   TITLE (low back) she will be able to verbalize and demonstrate techniques to reduce low back pain via postural awareness, lifting and carrying mechanics, and HEp (08/07/2014)   Time 6   Period Weeks   Status On-going               Plan - 07/10/14 1017    Clinical Impression Statement Cheryl King presents to therapy today with increased pain in her low back with referral of symptoms to the R lower glute. Due to increased increased pain focused todays treatment on low back.  She reported relief in symptoms following STM and trigger point release. Plan to being encorporating core strengthening.   PT Next Visit Plan continued core stability and perform steps next visit.  Assess benefit of PT/dry needling and do lumbar goals.   PT Home Exercise Plan HEP review   Consulted and Agree with Plan of Care Patient        Problem List Patient Active Problem List   Diagnosis Date Noted  . Smoking 12/26/2013  . Chronic lower back pain 04/13/2013  . Sciatica 04/13/2013  . Chronic neck pain 04/13/2013  . Spondylosis of cervical spine 04/13/2013   Starr Lake PT, DPT, LAT, ATC  07/10/2014  10:47 AM    [facility] [address] [place], [place], [address] Phone: [phone]   Fax:  [phone]

## 2014-07-15 ENCOUNTER — Ambulatory Visit: Payer: No Typology Code available for payment source | Admitting: Physical Therapy

## 2014-07-15 DIAGNOSIS — M542 Cervicalgia: Secondary | ICD-10-CM

## 2014-07-15 DIAGNOSIS — M2560 Stiffness of unspecified joint, not elsewhere classified: Secondary | ICD-10-CM

## 2014-07-15 DIAGNOSIS — M436 Torticollis: Secondary | ICD-10-CM

## 2014-07-15 DIAGNOSIS — M545 Low back pain: Secondary | ICD-10-CM

## 2014-07-15 DIAGNOSIS — M62830 Muscle spasm of back: Secondary | ICD-10-CM

## 2014-07-15 DIAGNOSIS — M2569 Stiffness of other specified joint, not elsewhere classified: Secondary | ICD-10-CM

## 2014-07-15 DIAGNOSIS — R29898 Other symptoms and signs involving the musculoskeletal system: Secondary | ICD-10-CM

## 2014-07-15 NOTE — Therapy (Signed)
[place], [place], [address] Phone: [phone]   Fax:  [phone]  Physical Therapy Treatment  Patient Details  Name: Cheryl King MRN: [id number] Date of Birth: September 04, 1965 Referring Provider:  Lance Bosch, NP  Encounter Date: 07/15/2014      PT End of Session - 07/15/14 1106    Visit Number 18   Number of Visits 30   Date for PT Re-Evaluation 08/07/14   PT Start Time 0930   PT Stop Time 1025   PT Time Calculation (min) 55 min   Activity Tolerance Patient tolerated treatment well   Behavior During Therapy [facility] for tasks assessed/performed      Past Medical History  Diagnosis Date  . Low back pain   . Sacroiliac inflammation   . Seasonal allergies   . Diverticulitis   . Sciatica     Past Surgical History  Procedure Laterality Date  . Ankle surgery      lt.  Marland Kitchen Uretherl      uretrral diverticultis  . Abdominal hysterectomy    . Abdominal surgery    . Right breast cyst removed       There were no vitals filed for this visit.  Visit Diagnosis:  Bilateral low back pain, with sciatica presence unspecified  Muscle spasm of back  Decreased ROM of trunk and back  Shoulder weakness  Neck pain  Decreased ROM of neck  Neck stiffness      Subjective Assessment - 07/15/14 0935    Subjective "I have a headache today that started last night and my neck is bothering me more today which is at a 6/10"   Currently in Pain? Yes   Pain Score 6    Pain Location Neck   Pain Orientation Right;Mid   Pain Descriptors / Indicators Aching   Pain Type Chronic pain   Pain Onset More than a month ago   Pain Frequency Constant   Pain Score 7   Pain Location Back   Pain Orientation Right;Mid;Lower   Pain Descriptors / Indicators Aching   Aggravating Factors  bending forward, and sitting for long periods of time.                          [facility] Adult PT  Treatment/Exercise - 07/15/14 0001    Neck Exercises: Supine   Neck Retraction 10 reps;5 secs   Shoulder ABduction Both;10 reps  horizontal abduction, red theraband.   Lumbar Exercises: Stretches   Passive Hamstring Stretch 2 reps;30 seconds   Single Knee to Chest Stretch 2 reps;30 seconds   Lower Trunk Rotation 5 reps;20 seconds   Quadruped Mid Back Stretch 2 reps;30 seconds   Piriformis Stretch 2 reps;30 seconds   Lumbar Exercises: Supine   Bent Knee Raise 10 reps;2 seconds  VC for transverse abdominal contraction   Moist Heat Therapy   Number Minutes Moist Heat 10 Minutes   Moist Heat Location Lumbar Spine;Cervical  in supine   Electrical Stimulation   Electrical Stimulation Location bil parapsinals   Electrical Stimulation Action IFC   Electrical Stimulation Parameters 10 min, 100 % scan, 5000 carrier frequency.   Electrical Stimulation Goals Pain   Manual Therapy   Joint Mobilization lumbar L1-L5 Grade 2-3 P/A, cervical P/A grade 2-3 and left lateral gapping    Soft tissue mobilization gluteals, piriformis, lumbar multifidi bilaterally, sub-occpital release,    Myofascial Release bil lumbar paraspinals and multifidi instrument assisted  STM , piriformis tack and stretch                  PT Short Term Goals - 07/08/14 1250    PT SHORT TERM GOAL #1   Title pt will be I with Basic HEP 05/27/2014   Time 4   Period Weeks   Status Achieved   PT SHORT TERM GOAL #2   Title she will increase Cervical rotation bil by > 10 degrees to assist with safety during driving 0/02/2334   Time 4   Period Weeks   Status Achieved   PT SHORT TERM GOAL #3   Title pt will decreased neck pain to <5/10 to assist with functional exercise 05/27/2014   Time 4   Period Weeks   Status Achieved   PT SHORT TERM GOAL #4   Title pt will increase R shoulder flexion/abduction to > 4-/5 to assist with ADL's 05/27/2014   Time 4   Period Weeks   Status Achieved   PT SHORT TERM GOAL #5   Title (low  back) pt will decrease low back pain to < 5/10 during and following standing and walking for > 5-10 min to assist with increased funcitonal endurance ( 07/17/2014)   Time 3   Period Weeks   Status On-going   PT SHORT TERM GOAL #6   Title (low back) She wil lincreaes trunk mobility by > 8 degrees to assist with ADLs (07/17/2014)   Baseline 3   Period Weeks   Status On-going   PT SHORT TERM GOAL #7   Title (low back) pt will tolerated trunk mobility in all planes with no radiation of symtoms below bil glutes to assist with funcitonal progress (07/17/2014)   Time 3   Period Weeks   Status On-going           PT Long Term Goals - 07/08/14 1250    PT LONG TERM GOAL #1   Title pt will be I with advanced HEP 07/06/2014   Baseline given quadriped ex   Time 8   Period Weeks   Status On-going   PT LONG TERM GOAL #2   Title pt will Increase overall cervical mobility by >20 degrees in all planes to assist with functional mobility and safety during driving 02/09/4495   Baseline see AROM cervical WNL   Time 8   Period Weeks   Status Achieved   PT LONG TERM GOAL #3   Title pt will decrease cervical pain to <3/10 to assist with ADLs, and functional exercises 07/06/2014   Baseline pt pain 4/10 today   Time 8   Period Weeks   Status On-going   PT LONG TERM GOAL #4   Title pt will increase R shoulder strength to >4+/5 to help with ADLs 07/06/2014   Time 8   Period Weeks   Status On-going   PT LONG TERM GOAL #5   Title pt will be able to verbalize and demonstrate techniques to reduce risk or cervical reinjury by postural awareness, and HEP   Time 8   Period Days   Status Achieved   PT LONG TERM GOAL #6   Title (low back) pt will increase trunk mobility to [facility] to assist with  ADLS (06/16/509)   Baseline must lean over to pull pants up pain at 7/10    Time 6   Period Weeks   Status On-going   PT LONG TERM GOAL #7   Title (low back) pt will be able  to tolerate walking/ standing for > 30  minutes and/or >10 steps to assist with funcitonal endurance and community amb (08/07/2014)   Baseline able to stand for 30 minutes but avoid steps   Time 6   Period Weeks   Status On-going   PT LONG TERM GOAL #8   Title (low back) pt will increae bil LE strength to > 4+/5 to assist with ADLs including shopping and traveling (08/07/2014)   Time 6   Period Weeks   Status On-going   PT LONG TERM GOAL  #9   TITLE (low back) she will be able to verbalize and demonstrate techniques to reduce low back pain via postural awareness, lifting and carrying mechanics, and HEp (08/07/2014)   Time 6   Period Weeks   Status On-going               Plan - 07/15/14 1106    Clinical Impression Statement Cheryl King reports that she has increaed neck pain and a headache today but reports trying to do more at home. Following STM of R upper trap, cervical mobiliztion, and sub-occipital release she reported no more headach. She tolerated exercises well with mild complaint of tightness in her R glute/piriformis. Plan to progress with strengthening for the shulders and low back/core.    PT Next Visit Plan continued core stability and perform steps next visit.  Assess benefit of PT/dry needling and do lumbar goals.   PT Home Exercise Plan HEP review   Consulted and Agree with Plan of Care Patient        Problem List Patient Active Problem List   Diagnosis Date Noted  . Smoking 12/26/2013  . Chronic lower back pain 04/13/2013  . Sciatica 04/13/2013  . Chronic neck pain 04/13/2013  . Spondylosis of cervical spine 04/13/2013   Starr Lake PT, DPT, LAT, ATC  07/15/2014  11:10 AM    [facility] [address] [place], [place], [address] Phone: [phone]   Fax:  [phone]

## 2014-07-17 ENCOUNTER — Ambulatory Visit: Payer: No Typology Code available for payment source | Admitting: Physical Therapy

## 2014-07-17 DIAGNOSIS — M542 Cervicalgia: Secondary | ICD-10-CM

## 2014-07-17 DIAGNOSIS — M2569 Stiffness of other specified joint, not elsewhere classified: Secondary | ICD-10-CM

## 2014-07-17 DIAGNOSIS — R29898 Other symptoms and signs involving the musculoskeletal system: Secondary | ICD-10-CM

## 2014-07-17 DIAGNOSIS — M545 Low back pain: Secondary | ICD-10-CM

## 2014-07-17 DIAGNOSIS — M62830 Muscle spasm of back: Secondary | ICD-10-CM

## 2014-07-17 DIAGNOSIS — M25611 Stiffness of right shoulder, not elsewhere classified: Secondary | ICD-10-CM

## 2014-07-17 DIAGNOSIS — M2560 Stiffness of unspecified joint, not elsewhere classified: Secondary | ICD-10-CM

## 2014-07-17 DIAGNOSIS — M436 Torticollis: Secondary | ICD-10-CM

## 2014-07-17 NOTE — Therapy (Signed)
[place], [place], [address] Phone: [phone]   Fax:  [phone]  Physical Therapy Treatment  Patient Details  Name: Cheryl King MRN: [id number] Date of Birth: [birth year] Referring Provider:  Jessy Oto, MD  Encounter Date: 07/17/2014      PT End of Session - 07/17/14 1234    Visit Number 19   Number of Visits 30   Date for PT Re-Evaluation 08/07/14   PT Start Time 0930   PT Stop Time 1025   PT Time Calculation (min) 55 min   Activity Tolerance Patient tolerated treatment well   Behavior During Therapy [facility] for tasks assessed/performed      Past Medical History  Diagnosis Date  . Low back pain   . Sacroiliac inflammation   . Seasonal allergies   . Diverticulitis   . Sciatica     Past Surgical History  Procedure Laterality Date  . Ankle surgery      lt.  Marland Kitchen Uretherl      uretrral diverticultis  . Abdominal hysterectomy    . Abdominal surgery    . Right breast cyst removed       There were no vitals filed for this visit.  Visit Diagnosis:  Bilateral low back pain, with sciatica presence unspecified  Muscle spasm of back  Decreased ROM of trunk and back  Shoulder weakness  Neck pain  Weakness of both legs  Decreased shoulder mobility, right  Decreased ROM of neck  Neck stiffness      Subjective Assessment - 07/17/14 0935    Subjective "I went out last night for a friends birthday and it was packed so my back and neck are sore"   Currently in Pain? Yes   Pain Score 6    Pain Location Neck   Pain Orientation Right;Mid   Pain Onset More than a month ago   Pain Frequency Intermittent   Aggravating Factors  rotating, and    Pain Score 7   Pain Location Back   Pain Orientation Right;Mid;Lower   Pain Descriptors / Indicators Aching   Pain Type Chronic pain   Pain Onset More than a month ago   Pain Frequency Constant            [facility] PT Assessment -  07/17/14 1003    AROM   Cervical Flexion 30   Cervical Extension 50   Cervical - Right Side Bend 48   Cervical - Left Side Bend 30   Cervical - Right Rotation 55   Cervical - Left Rotation 45                     [facility] Adult PT Treatment/Exercise - 07/17/14 0938    Neck Exercises: Machines for Strengthening   UBE (Upper Arm Bike) Nu Step L4 x 10 min  pushing with both UE/LE   Neck Exercises: Standing   Other Standing Exercises rows and ext 2 x 10   red theraband. VC to keep elbows by side   Neck Exercises: Supine   Neck Retraction 10 reps;5 secs  VC for maintaining chin tuck   Shoulder ABduction Both;10 reps  horizontal   Lumbar Exercises: Stretches   Passive Hamstring Stretch 2 reps;30 seconds   Single Knee to Chest Stretch 2 reps;30 seconds   Lower Trunk Rotation 5 reps;20 seconds   Quadruped Mid Back Stretch 2 reps;30 seconds   Piriformis Stretch 2 reps;30 seconds   Lumbar Exercises: Standing  Wall Slides 10 reps   Other Standing Lumbar Exercises standing hip abd/ ext 2 x 10 ea.   Other Standing Lumbar Exercises step ups 2 x 10 on 6 inch step   leading with RLE, and 1 set leading with LLE   Lumbar Exercises: Supine   Bent Knee Raise 10 reps;2 seconds   Bent Knee Raise Limitations VC for abdominal contraction.    Bridge 10 reps   Lumbar Exercises: Prone   Other Prone Lumbar Exercises quadrped cat camel and arm leg opposite lift x 10 each  and child's pose 60 sec   Moist Heat Therapy   Number Minutes Moist Heat 10 Minutes   Moist Heat Location Lumbar Spine   Electrical Stimulation   Electrical Stimulation Location R glute/piriformis   Electrical Stimulation Action IFC   Electrical Stimulation Parameters 10 min, 100% scan, 5000 carrier frequency.   Electrical Stimulation Goals Pain   Manual Therapy   Joint Mobilization lumbar L1-L5 Grade 2-3 P/A,    Soft tissue mobilization glutes, piriformis, tack and stretch technqiue for piriformis   Myofascial  Release trigger point rlease of bil upper traps/levator scap                PT Education - 07/17/14 1234    Education provided Yes   Education Details progressive resistive exercises education   Person(s) Educated Patient   Methods Explanation   Comprehension Verbalized understanding          PT Short Term Goals - 07/08/14 1250    PT SHORT TERM GOAL #1   Title pt will be I with Basic HEP 05/27/2014   Time 4   Period Weeks   Status Achieved   PT SHORT TERM GOAL #2   Title she will increase Cervical rotation bil by > 10 degrees to assist with safety during driving 04/24/960   Time 4   Period Weeks   Status Achieved   PT SHORT TERM GOAL #3   Title pt will decreased neck pain to <5/10 to assist with functional exercise 05/27/2014   Time 4   Period Weeks   Status Achieved   PT SHORT TERM GOAL #4   Title pt will increase R shoulder flexion/abduction to > 4-/5 to assist with ADL's 05/27/2014   Time 4   Period Weeks   Status Achieved   PT SHORT TERM GOAL #5   Title (low back) pt will decrease low back pain to < 5/10 during and following standing and walking for > 5-10 min to assist with increased funcitonal endurance ( 07/17/2014)   Time 3   Period Weeks   Status On-going   PT SHORT TERM GOAL #6   Title (low back) She wil lincreaes trunk mobility by > 8 degrees to assist with ADLs (07/17/2014)   Baseline 3   Period Weeks   Status On-going   PT SHORT TERM GOAL #7   Title (low back) pt will tolerated trunk mobility in all planes with no radiation of symtoms below bil glutes to assist with funcitonal progress (07/17/2014)   Time 3   Period Weeks   Status On-going           PT Long Term Goals - 07/08/14 1250    PT LONG TERM GOAL #1   Title pt will be I with advanced HEP 07/06/2014   Baseline given quadriped ex   Time 8   Period Weeks   Status On-going   PT LONG TERM GOAL #2   Title  pt will Increase overall cervical mobility by >20 degrees in all planes to assist with  functional mobility and safety during driving 2/70/3500   Baseline see AROM cervical WNL   Time 8   Period Weeks   Status Achieved   PT LONG TERM GOAL #3   Title pt will decrease cervical pain to <3/10 to assist with ADLs, and functional exercises 07/06/2014   Baseline pt pain 4/10 today   Time 8   Period Weeks   Status On-going   PT LONG TERM GOAL #4   Title pt will increase R shoulder strength to >4+/5 to help with ADLs 07/06/2014   Time 8   Period Weeks   Status On-going   PT LONG TERM GOAL #5   Title pt will be able to verbalize and demonstrate techniques to reduce risk or cervical reinjury by postural awareness, and HEP   Time 8   Period Days   Status Achieved   PT LONG TERM GOAL #6   Title (low back) pt will increase trunk mobility to [facility] to assist with  ADLS (9/38/1829)   Baseline must lean over to pull pants up pain at 7/10    Time 6   Period Weeks   Status On-going   PT LONG TERM GOAL #7   Title (low back) pt will be able to tolerate walking/ standing for > 30 minutes and/or >10 steps to assist with funcitonal endurance and community amb (08/07/2014)   Baseline able to stand for 30 minutes but avoid steps   Time 6   Period Weeks   Status On-going   PT LONG TERM GOAL #8   Title (low back) pt will increae bil LE strength to > 4+/5 to assist with ADLs including shopping and traveling (08/07/2014)   Time 6   Period Weeks   Status On-going   PT LONG TERM GOAL  #9   TITLE (low back) she will be able to verbalize and demonstrate techniques to reduce low back pain via postural awareness, lifting and carrying mechanics, and HEp (08/07/2014)   Time 6   Period Weeks   Status On-going               Plan - 07/17/14 1235    Clinical Impression Statement Jozlyn reports feeling more tired in her legs today due to hanging out with friends last night. She reports her neck and back pain to remain the same since the last visit. progress to shoulder and hip/low back exercises  today which she reported feeling tight in the shulders and hips but was able to complete them. She continues to report most pain in her R piriformis/ gluteal region and focused trigger point relase and e-stim and heat to help alleviate pain and tightness.    PT Next Visit Plan continued core stability and perform steps next visit.  Assess benefit of PT/dry needling and assess neck and lumbar goals, FOTO   Consulted and Agree with Plan of Care Patient        Problem List Patient Active Problem List   Diagnosis Date Noted  . Smoking 12/26/2013  . Chronic lower back pain 04/13/2013  . Sciatica 04/13/2013  . Chronic neck pain 04/13/2013  . Spondylosis of cervical spine 04/13/2013    Starr Lake PT, DPT, LAT, ATC  07/17/2014  12:42 PM      [facility] [address] [place], [place], [address] Phone: [phone]   Fax:  [phone]

## 2014-07-25 ENCOUNTER — Encounter: Payer: Self-pay | Admitting: Internal Medicine

## 2014-07-25 ENCOUNTER — Ambulatory Visit: Payer: No Typology Code available for payment source | Attending: Specialist | Admitting: Physical Therapy

## 2014-07-25 ENCOUNTER — Ambulatory Visit: Payer: No Typology Code available for payment source | Attending: Internal Medicine | Admitting: Internal Medicine

## 2014-07-25 VITALS — BP 109/74 | HR 72 | Temp 98.0°F | Ht 67.0 in | Wt 146.0 lb

## 2014-07-25 DIAGNOSIS — M25611 Stiffness of right shoulder, not elsewhere classified: Secondary | ICD-10-CM

## 2014-07-25 DIAGNOSIS — M7581 Other shoulder lesions, right shoulder: Secondary | ICD-10-CM | POA: Insufficient documentation

## 2014-07-25 DIAGNOSIS — M62830 Muscle spasm of back: Secondary | ICD-10-CM

## 2014-07-25 DIAGNOSIS — R29898 Other symptoms and signs involving the musculoskeletal system: Secondary | ICD-10-CM

## 2014-07-25 DIAGNOSIS — R358 Other polyuria: Secondary | ICD-10-CM | POA: Insufficient documentation

## 2014-07-25 DIAGNOSIS — M2560 Stiffness of unspecified joint, not elsewhere classified: Secondary | ICD-10-CM

## 2014-07-25 DIAGNOSIS — M542 Cervicalgia: Secondary | ICD-10-CM

## 2014-07-25 DIAGNOSIS — M2569 Stiffness of other specified joint, not elsewhere classified: Secondary | ICD-10-CM

## 2014-07-25 DIAGNOSIS — M436 Torticollis: Secondary | ICD-10-CM

## 2014-07-25 DIAGNOSIS — R3589 Other polyuria: Secondary | ICD-10-CM

## 2014-07-25 DIAGNOSIS — B354 Tinea corporis: Secondary | ICD-10-CM | POA: Insufficient documentation

## 2014-07-25 DIAGNOSIS — M545 Low back pain: Secondary | ICD-10-CM

## 2014-07-25 LAB — POCT URINALYSIS DIPSTICK
Bilirubin, UA: NEGATIVE
Blood, UA: NEGATIVE
Glucose, UA: NEGATIVE
Ketones, UA: NEGATIVE
Leukocytes, UA: NEGATIVE
Nitrite, UA: NEGATIVE
Protein, UA: NEGATIVE
SPEC GRAV UA: 1.02
UROBILINOGEN UA: 0.2
pH, UA: 7.5

## 2014-07-25 MED ORDER — KETOCONAZOLE 2 % EX CREA: 1.0000 "application " | TOPICAL_CREAM | Freq: Two times a day (BID) | CUTANEOUS | Status: DC

## 2014-07-25 NOTE — Therapy (Signed)
[place], [place], [address] Phone: [phone]   Fax:  [phone]  Physical Therapy Treatment  Patient Details  Name: Cheryl King MRN: [id number] Date of Birth: Nov 11, 1965 Referring Provider:  Lance Bosch, NP  Encounter Date: 07/25/2014      PT End of Session - 07/25/14 4332    Activity Tolerance Patient tolerated treatment well   Behavior During Therapy [facility] for tasks assessed/performed      Past Medical History  Diagnosis Date  . Low back pain   . Sacroiliac inflammation   . Seasonal allergies   . Diverticulitis   . Sciatica     Past Surgical History  Procedure Laterality Date  . Ankle surgery      lt.  Marland Kitchen Uretherl      uretrral diverticultis  . Abdominal hysterectomy    . Abdominal surgery    . Right breast cyst removed       There were no vitals filed for this visit.  Visit Diagnosis:  Bilateral low back pain, with sciatica presence unspecified  Muscle spasm of back  Decreased ROM of trunk and back  Shoulder weakness  Neck pain  Neck stiffness  Decreased ROM of neck  Decreased shoulder mobility, right  Weakness of both legs      Subjective Assessment - 07/25/14 0806    Subjective "I had to mow my yard the other day and am feeling more sore today"   Currently in Pain? Yes   Pain Score 6    Pain Location Neck   Pain Orientation Right;Left   Pain Descriptors / Indicators Aching   Pain Type Chronic pain   Pain Onset More than a month ago   Pain Frequency Intermittent   Pain Score 8   Pain Location Back   Pain Orientation Right;Mid;Lower   Pain Descriptors / Indicators Aching   Pain Type Chronic pain   Pain Onset More than a month ago   Pain Frequency Constant                         [facility] Adult PT Treatment/Exercise - 07/25/14 0001    Lumbar Exercises: Stretches   Passive Hamstring Stretch 2 reps;30 seconds   Single Knee to Chest  Stretch 2 reps;30 seconds   Lower Trunk Rotation 5 reps;20 seconds   Quadruped Mid Back Stretch 2 reps;30 seconds   Piriformis Stretch 2 reps;30 seconds   Lumbar Exercises: Aerobic   Stationary Bike Nu Step L5 x 10 min   Lumbar Exercises: Supine   Bent Knee Raise 10 reps;2 seconds   Bent Knee Raise Limitations VC for abdominal contraction.    Bridge 15 reps  with sustained hip abduction with blue theraband   Traction   Type of Traction Lumbar   Min (lbs) 28   Max (lbs) 58   Hold Time 10   Rest Time 10   Time 15   Manual Therapy   Joint Mobilization lumbar L1-L5 Grade 2-3 P/A,    Soft tissue mobilization glutes, piriformis, tack and stretch technqiue for piriformis   Neck Exercises: Stretches   Upper Trapezius Stretch 2 reps;30 seconds   Upper Trapezius Stretch Limitations needed VC and holding onto chair  reported tightness initially with easing in the back after.                 PT Education - 07/25/14 [phone]    Education provided Yes   Education  Details educated about lumbar traction   Person(s) Educated Patient   Methods Explanation   Comprehension Verbalized understanding          PT Short Term Goals - 07/25/14 0929    PT SHORT TERM GOAL #1   Title pt will be I with Basic HEP 05/27/2014   Time 4   Period Weeks   Status Achieved   PT SHORT TERM GOAL #2   Title she will increase Cervical rotation bil by > 10 degrees to assist with safety during driving 03/24/1060   Time 4   Period Weeks   Status Achieved   PT SHORT TERM GOAL #3   Title pt will decreased neck pain to <5/10 to assist with functional exercise 05/27/2014   Time 4   Period Weeks   Status Achieved   PT SHORT TERM GOAL #4   Title pt will increase R shoulder flexion/abduction to > 4-/5 to assist with ADL's 05/27/2014   Time 4   Period Weeks   Status Achieved   PT SHORT TERM GOAL #5   Title (low back) pt will decrease low back pain to < 5/10 during and following standing and walking for > 5-10 min  to assist with increased funcitonal endurance ( 07/17/2014)   Time 3   Status On-going   PT SHORT TERM GOAL #6   Title (low back) She wil lincreaes trunk mobility by > 8 degrees to assist with ADLs (07/17/2014)   Baseline 3   Period Weeks   Status On-going   PT SHORT TERM GOAL #7   Title (low back) pt will tolerated trunk mobility in all planes with no radiation of symtoms below bil glutes to assist with funcitonal progress (07/17/2014)   Time 3   Period Weeks   Status On-going           PT Long Term Goals - 07/25/14 0930    PT LONG TERM GOAL #1   Title pt will be I with advanced HEP 07/06/2014   Baseline given quadriped ex   Time 8   Period Weeks   Status On-going   PT LONG TERM GOAL #2   Title pt will Increase overall cervical mobility by >20 degrees in all planes to assist with functional mobility and safety during driving 6/94/8546   Baseline see AROM cervical WNL   Time 8   Status Achieved   PT LONG TERM GOAL #3   Title pt will decrease cervical pain to <3/10 to assist with ADLs, and functional exercises 07/06/2014   Baseline pt pain 4/10 today   Time 8   Period Weeks   Status On-going   PT LONG TERM GOAL #4   Title pt will increase R shoulder strength to >4+/5 to help with ADLs 07/06/2014   Time 8   Period Weeks   Status On-going   PT LONG TERM GOAL #5   Title pt will be able to verbalize and demonstrate techniques to reduce risk or cervical reinjury by postural awareness, and HEP   Time 8   Period Days   Status Achieved   PT LONG TERM GOAL #6   Title (low back) pt will increase trunk mobility to [facility] to assist with  ADLS (2/70/3500)   Baseline must lean over to pull pants up pain at 7/10    Time 6   Period Weeks   Status On-going   PT LONG TERM GOAL #7   Title (low back) pt will be able to tolerate walking/  standing for > 30 minutes and/or >10 steps to assist with funcitonal endurance and community amb (08/07/2014)   Baseline able to stand for 30 minutes but  avoid steps   Time 6   Period Weeks   Status On-going   PT LONG TERM GOAL #8   Title (low back) pt will increae bil LE strength to > 4+/5 to assist with ADLs including shopping and traveling (08/07/2014)   Time 6   Period Weeks   Status On-going   PT LONG TERM GOAL  #9   TITLE (low back) she will be able to verbalize and demonstrate techniques to reduce low back pain via postural awareness, lifting and carrying mechanics, and HEp (08/07/2014)   Time 6   Period Weeks   Status On-going               Plan - 07/25/14 0925    Clinical Impression Statement Cheryl King presents to therapy today with a new script for lumbar traction from her physician. She tolerated exercises well today without report of increased pain but reported only incrased fatigue. Set pt up on lumbar traction today which she reported no pain during and that she felt good but when she got up she reported she felt "off" assessed her BP and it was 120/76, she sat and rested for aout 5 min and reported she felt fine. Advised pt to take it easy today and rest, and call PT to let know how she is doing.    PT Next Visit Plan continued core stability and perform steps next visit.  Assess benefit of PT/dry needling and assess neck and lumbar goals, FOTO, FOTO, FOTO, lumbar tx   PT Home Exercise Plan lumbar tx education   Consulted and Agree with Plan of Care Patient        Problem List Patient Active Problem List   Diagnosis Date Noted  . Smoking 12/26/2013  . Chronic lower back pain 04/13/2013  . Sciatica 04/13/2013  . Chronic neck pain 04/13/2013  . Spondylosis of cervical spine 04/13/2013   Starr Lake PT, DPT, LAT, ATC  07/25/2014  9:33 AM    [facility] [address] [place], [place], [address] Phone: [phone]   Fax:  [phone]

## 2014-07-25 NOTE — Progress Notes (Signed)
Patient complains of "rash" on her right arm started 1 week ago It appears to be ringworm No other complaints

## 2014-07-25 NOTE — Patient Instructions (Signed)

## 2014-07-29 ENCOUNTER — Ambulatory Visit: Payer: No Typology Code available for payment source | Admitting: Physical Therapy

## 2014-07-29 DIAGNOSIS — M2560 Stiffness of unspecified joint, not elsewhere classified: Secondary | ICD-10-CM

## 2014-07-29 DIAGNOSIS — M62830 Muscle spasm of back: Secondary | ICD-10-CM

## 2014-07-29 DIAGNOSIS — M2569 Stiffness of other specified joint, not elsewhere classified: Secondary | ICD-10-CM

## 2014-07-29 DIAGNOSIS — R29898 Other symptoms and signs involving the musculoskeletal system: Secondary | ICD-10-CM

## 2014-07-29 DIAGNOSIS — M545 Low back pain: Secondary | ICD-10-CM

## 2014-07-29 NOTE — Therapy (Signed)
[place], [place], [address] Phone: [phone]   Fax:  [phone]  Physical Therapy Treatment  Patient Details  Name: Cheryl King MRN: [id number] Date of Birth: 16-Apr-1965 Referring Provider:  Lance Bosch, NP  Encounter Date: 07/29/2014      PT End of Session - 07/29/14 1141    Visit Number 21   Number of Visits 30   Date for PT Re-Evaluation 08/07/14   PT Start Time 1100   PT Stop Time 1200   PT Time Calculation (min) 60 min   Activity Tolerance Patient tolerated treatment well   Behavior During Therapy [facility] for tasks assessed/performed      Past Medical History  Diagnosis Date  . Low back pain   . Sacroiliac inflammation   . Seasonal allergies   . Diverticulitis   . Sciatica     Past Surgical History  Procedure Laterality Date  . Ankle surgery      lt.  Marland Kitchen Uretherl      uretrral diverticultis  . Abdominal hysterectomy    . Abdominal surgery    . Right breast cyst removed       There were no vitals filed for this visit.  Visit Diagnosis:  Muscle spasm of back  Bilateral low back pain, with sciatica presence unspecified  Decreased ROM of trunk and back  Weakness of both legs      Subjective Assessment - 07/29/14 1058    Subjective "I still have pain in my low back, and have pain going down my R leg"  She reports no differences with the traction last visit.    Currently in Pain? Yes   Pain Score 5    Pain Location Neck   Pain Orientation Right;Left   Pain Descriptors / Indicators Aching   Pain Type Chronic pain   Pain Onset More than a month ago   Pain Frequency Intermittent   Aggravating Factors  rotating, and looking up/down   Pain Relieving Factors nothing   Pain Score 7   Pain Location Back   Pain Orientation Right;Mid;Lower   Pain Descriptors / Indicators Aching   Pain Type Chronic pain   Pain Onset More than a month ago   Pain Frequency Constant    Aggravating Factors  bending forward and sitting for long periods of time   Pain Relieving Factors nothing changes it                         [facility] Adult PT Treatment/Exercise - 07/29/14 1103    Neck Exercises: Machines for Strengthening   UBE (Upper Arm Bike) L 1.5 x 5 min  alt direction 2.5 min   Lumbar Exercises: Stretches   Passive Hamstring Stretch 2 reps;30 seconds   Single Knee to Chest Stretch 2 reps;30 seconds   Lower Trunk Rotation 5 reps;20 seconds   Quadruped Mid Back Stretch 2 reps;30 seconds   Piriformis Stretch 30 seconds;3 reps  PNF contract relax ax 10 sec hold   Lumbar Exercises: Standing   Other Standing Lumbar Exercises standing hip abd/ ext 2 x 10 ea.   Other Standing Lumbar Exercises step ups 2 x 10 on 6 inch step    Lumbar Exercises: Supine   Bent Knee Raise 10 reps;2 seconds   Bent Knee Raise Limitations VC for abdominal contraction.    Traction   Type of Traction Lumbar   Min (lbs) 30   Max (lbs) 60  Hold Time 10   Rest Time 10   Time 15   Manual Therapy   Joint Mobilization lumbar L1-L5 Grade 2-3 P/A,    Soft tissue mobilization glutes, piriformis, tack and stretch technqiue for piriformis   Myofascial Release trigger point release of piriformis and glute max, and bil lumbar paraspinals                PT Education - 07/29/14 1141    Education provided Yes   Education Details updated HEP   Methods Explanation   Comprehension Verbalized understanding          PT Short Term Goals - 07/29/14 1229    PT SHORT TERM GOAL #1   Title pt will be I with Basic HEP 05/27/2014   Time 4   Period Weeks   Status Achieved   PT SHORT TERM GOAL #2   Title she will increase Cervical rotation bil by > 10 degrees to assist with safety during driving 03/26/1827   Time 4   Period Weeks   Status Achieved   PT SHORT TERM GOAL #3   Title pt will decreased neck pain to <5/10 to assist with functional exercise 05/27/2014   Time 4   Period  Weeks   Status Achieved   PT SHORT TERM GOAL #4   Title pt will increase R shoulder flexion/abduction to > 4-/5 to assist with ADL's 05/27/2014   Time 4   Period Weeks   Status Achieved   PT SHORT TERM GOAL #5   Title (low back) pt will decrease low back pain to < 5/10 during and following standing and walking for > 5-10 min to assist with increased funcitonal endurance ( 07/17/2014)   Time 3   Period Weeks   Status On-going   PT SHORT TERM GOAL #6   Title (low back) She wil lincreaes trunk mobility by > 8 degrees to assist with ADLs (07/17/2014)   Baseline 3   Period Weeks   Status On-going   PT SHORT TERM GOAL #7   Title (low back) pt will tolerated trunk mobility in all planes with no radiation of symtoms below bil glutes to assist with funcitonal progress (07/17/2014)   Time 3   Period Weeks   Status On-going           PT Long Term Goals - 07/29/14 1229    PT LONG TERM GOAL #1   Title pt will be I with advanced HEP 07/06/2014   Baseline given quadriped ex   Time 8   Period Weeks   Status On-going   PT LONG TERM GOAL #2   Title pt will Increase overall cervical mobility by >20 degrees in all planes to assist with functional mobility and safety during driving 9/37/1696   Baseline see AROM cervical WNL   Time 8   Period Weeks   Status Achieved   PT LONG TERM GOAL #3   Title pt will decrease cervical pain to <3/10 to assist with ADLs, and functional exercises 07/06/2014   Baseline pt pain 4/10 today   Time 8   Period Weeks   Status On-going   PT LONG TERM GOAL #4   Title pt will increase R shoulder strength to >4+/5 to help with ADLs 07/06/2014   Time 8   Period Weeks   Status On-going   PT LONG TERM GOAL #5   Title pt will be able to verbalize and demonstrate techniques to reduce risk or cervical reinjury by postural  awareness, and HEP   Time 8   Period Days   Status Achieved   PT LONG TERM GOAL #6   Title (low back) pt will increase trunk mobility to [facility] to assist  with  ADLS (09/12/35)   Baseline must lean over to pull pants up pain at 7/10    Time 6   Period Weeks   Status On-going   PT LONG TERM GOAL #7   Title (low back) pt will be able to tolerate walking/ standing for > 30 minutes and/or >10 steps to assist with funcitonal endurance and community amb (08/07/2014)   Baseline able to stand for 30 minutes but avoid steps   Time 6   Period Weeks   Status On-going   PT LONG TERM GOAL #8   Title (low back) pt will increae bil LE strength to > 4+/5 to assist with ADLs including shopping and traveling (08/07/2014)   Time 6   Period Weeks   Status On-going   PT LONG TERM GOAL  #9   TITLE (low back) she will be able to verbalize and demonstrate techniques to reduce low back pain via postural awareness, lifting and carrying mechanics, and HEp (08/07/2014)   Time 6   Period Weeks   Status On-going               Plan - 07/29/14 1141    Clinical Impression Statement Julie continues to report pain in the neck and low back stating "the back is always a 7/10 it never change" . Focused todays treatment on the low back.  Continue working on lumbar mobility and trigger point release and lumbar mobs grade 2 to assist with decreased pain.  No new goals me todays visit.  Following lumbar traction she reported that she felt better than last treatment session.   PT Next Visit Plan continued core stability and perform steps next visit.  Assess benefit of PT/dry needling, Lumbar traction   PT Home Exercise Plan cat/cow stretch, and piriformis stretch   Consulted and Agree with Plan of Care Patient        Problem List Patient Active Problem List   Diagnosis Date Noted  . Smoking 12/26/2013  . Chronic lower back pain 04/13/2013  . Sciatica 04/13/2013  . Chronic neck pain 04/13/2013  . Spondylosis of cervical spine 04/13/2013   Starr Lake PT, DPT, LAT, ATC  07/29/2014  12:38 PM    [facility] [address] [place], [place], [address] Phone: [phone]   Fax:  [phone]

## 2014-07-29 NOTE — Patient Instructions (Signed)
   Kristoffer Leamon PT, DPT, LAT, ATC  [facility] Phone: [phone]     

## 2014-08-01 ENCOUNTER — Ambulatory Visit: Payer: No Typology Code available for payment source | Admitting: Physical Therapy

## 2014-08-01 DIAGNOSIS — M2569 Stiffness of other specified joint, not elsewhere classified: Secondary | ICD-10-CM

## 2014-08-01 DIAGNOSIS — M2560 Stiffness of unspecified joint, not elsewhere classified: Secondary | ICD-10-CM

## 2014-08-01 DIAGNOSIS — R29898 Other symptoms and signs involving the musculoskeletal system: Secondary | ICD-10-CM

## 2014-08-01 DIAGNOSIS — M436 Torticollis: Secondary | ICD-10-CM

## 2014-08-01 DIAGNOSIS — M545 Low back pain: Secondary | ICD-10-CM

## 2014-08-01 DIAGNOSIS — M62830 Muscle spasm of back: Secondary | ICD-10-CM

## 2014-08-01 DIAGNOSIS — M25611 Stiffness of right shoulder, not elsewhere classified: Secondary | ICD-10-CM

## 2014-08-01 DIAGNOSIS — M542 Cervicalgia: Secondary | ICD-10-CM

## 2014-08-01 NOTE — Therapy (Signed)
[facility] [place], [place], [address] Phone: [phone]   Fax:  [phone]  Physical Therapy Treatment  Patient Details  Name: Cheryl King MRN: [id number] Date of Birth: 10/27/1965 Referring Provider:  Lance Bosch, NP  Encounter Date: 08/01/2014      PT End of Session - 08/01/14 1058    Visit Number 22   Number of Visits 30   Date for PT Re-Evaluation 08/07/14   PT Start Time 0930   PT Stop Time 3875   PT Time Calculation (min) 65 min   Activity Tolerance Patient tolerated treatment well;Patient limited by pain      Past Medical History  Diagnosis Date  . Low back pain   . Sacroiliac inflammation   . Seasonal allergies   . Diverticulitis   . Sciatica     Past Surgical History  Procedure Laterality Date  . Ankle surgery      lt.  Marland Kitchen Uretherl      uretrral diverticultis  . Abdominal hysterectomy    . Abdominal surgery    . Right breast cyst removed       There were no vitals filed for this visit.  Visit Diagnosis:  Muscle spasm of back  Bilateral low back pain, with sciatica presence unspecified  Decreased ROM of trunk and back  Weakness of both legs  Shoulder weakness  Neck pain  Neck stiffness  Decreased ROM of neck  Decreased shoulder mobility, right      Subjective Assessment - 08/01/14 0934    Subjective 6/10 neck, 7-8/10 in back; overall "a little bit better."  States the traction was "a little better."  States the needling was really painful last time but willing to try again.  Yard work like mowing exacerbates neck.     Currently in Pain? Yes   Pain Score 6    Pain Location Neck   Pain Orientation Right;Left   Pain Type Chronic pain   Aggravating Factors  mowing   Multiple Pain Sites Yes   Pain Score 7   Pain Location Back   Pain Orientation Right;Left;Mid   Aggravating Factors  bending, sitting                         [facility] Adult PT  Treatment/Exercise - 08/01/14 0942    Lumbar Exercises: Prone   Other Prone Lumbar Exercises prone shoulder extension, HABD with head lift 6x   Other Prone Lumbar Exercises pelvic press multifidi with LE movements   Moist Heat Therapy   Number Minutes Moist Heat 15 Minutes   Moist Heat Location Lumbar Spine   Electrical Stimulation   Electrical Stimulation Location bilateral low back    Electrical Stimulation Action IFC   Electrical Stimulation Parameters 10 ma   Electrical Stimulation Goals Pain   Manual Therapy   Soft tissue mobilization gluteals, lumbar multifidi, upper traps, levator scap, cervical multifidi          Trigger Point Dry Needling - 08/01/14 1057    Muscles Treated Upper Body Upper trapezius;Levator scapulae  bilateral cervical multifidi   Muscles Treated Lower Body Gluteus maximus;Piriformis  bilateral lumbar multifidi   Upper Trapezius Response Twitch reponse elicited;Palpable increased muscle length   Levator Scapulae Response Twitch response elicited;Palpable increased muscle length   Gluteus Maximus Response Palpable increased muscle length   Piriformis Response Palpable increased muscle length  PT Short Term Goals - 08/01/14 1708    PT SHORT TERM GOAL #1   Title pt will be I with Basic HEP 05/27/2014   Status Achieved   PT SHORT TERM GOAL #2   Title she will increase Cervical rotation bil by > 10 degrees to assist with safety during driving 02/25/348   Status Achieved   PT SHORT TERM GOAL #3   Title pt will decreased neck pain to <5/10 to assist with functional exercise 05/27/2014   Status Achieved   PT SHORT TERM GOAL #4   Title pt will increase R shoulder flexion/abduction to > 4-/5 to assist with ADL's 05/27/2014   Status Achieved   PT SHORT TERM GOAL #5   Title (low back) pt will decrease low back pain to < 5/10 during and following standing and walking for > 5-10 min to assist with increased funcitonal endurance ( 07/17/2014)    Time 3   Period Weeks   Status On-going   PT SHORT TERM GOAL #6   Title (low back) She wil lincreaes trunk mobility by > 8 degrees to assist with ADLs (07/17/2014)   Baseline 3   Period Weeks   Status On-going   PT SHORT TERM GOAL #7   Title (low back) pt will tolerated trunk mobility in all planes with no radiation of symtoms below bil glutes to assist with funcitonal progress (07/17/2014)   Time 3   Period Weeks   Status On-going           PT Long Term Goals - 08/01/14 1709    PT LONG TERM GOAL #1   Title pt will be I with advanced HEP 07/06/2014   Time 8   Period Weeks   Status On-going   PT LONG TERM GOAL #2   Title pt will Increase overall cervical mobility by >20 degrees in all planes to assist with functional mobility and safety during driving 0/93/8182   Status Achieved   PT LONG TERM GOAL #3   Title pt will decrease cervical pain to <3/10 to assist with ADLs, and functional exercises 07/06/2014   Time 8   Period Weeks   Status On-going   PT LONG TERM GOAL #4   Title pt will increase R shoulder strength to >4+/5 to help with ADLs 07/06/2014   Time 8   Period Weeks   Status On-going   PT LONG TERM GOAL #5   Title pt will be able to verbalize and demonstrate techniques to reduce risk or cervical reinjury by postural awareness, and HEP   Status Achieved   PT LONG TERM GOAL #6   Title (low back) pt will increase trunk mobility to [facility] to assist with  ADLS (9/93/7169)   Time 6   Period Weeks   Status On-going   PT LONG TERM GOAL #7   Title (low back) pt will be able to tolerate walking/ standing for > 30 minutes and/or >10 steps to assist with funcitonal endurance and community amb (08/07/2014)   Time 6   Period Weeks   Status On-going   PT LONG TERM GOAL #8   Title (low back) pt will increae bil LE strength to > 4+/5 to assist with ADLs including shopping and traveling (08/07/2014)   Time 6   Period Weeks   Status On-going   PT LONG TERM GOAL  #9   TITLE (low  back) she will be able to verbalize and demonstrate techniques to reduce low back pain via postural awareness,  lifting and carrying mechanics, and HEp (08/07/2014)   Time 6   Period Weeks   Status On-going               Plan - 08/01/14 1058    Clinical Impression Statement The patient reports she is "a little bit better" from lumbar traction, dry needling, manual interventions and therapeutic exercises however her pain level remains 7/10.    Discussed with patient that progress thus far is minimal.  Today is her 3rd time dry needling.  Will do further rechecks on ROM, pain and progress toward goals next visit with possible discharge and return to Md.   PT Next Visit Plan   Assess benefit of PT/dry needling, objective measurements;   certification period ending, assess progress toward goals, if no further progress discharge from PT        Problem List Patient Active Problem List   Diagnosis Date Noted  . Smoking 12/26/2013  . Chronic lower back pain 04/13/2013  . Sciatica 04/13/2013  . Chronic neck pain 04/13/2013  . Spondylosis of cervical spine 04/13/2013    Alvera Singh 08/01/2014, 5:12 PM  [facility] [place], [place], [address] Phone: [phone]   Fax:  [phone]   Ruben Im, PT 08/01/2014 5:13 PM Phone: [phone] Fax: [phone]

## 2014-08-04 ENCOUNTER — Encounter: Payer: Self-pay | Admitting: Internal Medicine

## 2014-08-04 NOTE — Progress Notes (Signed)
   Subjective:    Patient ID: Cheryl King, female    DOB: Jun 12, 1965, 49 y.o.   MRN: [id number]  HPI Comments: Patient reports that she has had increased urinary frequency for 2 weeks and wants to make sure she does not have a infection. No fever, chills, dysuria, or flank pain.   Rash This is a new problem. The current episode started in the past 7 days. The problem is unchanged. The affected locations include the left arm. The rash is characterized by dryness, itchiness and scaling. She was exposed to a new animal. Pertinent negatives include no fever. Treatments tried:  bleach. The treatment provided no relief. There is no history of eczema.    Review of Systems  Constitutional: Negative for fever and chills.  Genitourinary: Positive for frequency.  Skin: Positive for rash.  All other systems reviewed and are negative.  Objective:   Physical Exam  Abdominal: Soft. She exhibits no distension. There is no tenderness.  Negative CVA tenderness   Skin: Rash (tinea corpis left forearm) noted.      Assessment & Plan:  Audrinna was seen today for rash.  Diagnoses and all orders for this visit:  Tinea corporis Orders: -     Begin ketoconazole (NIZORAL) 2 % cream; Apply 1 application topically 2 (two) times daily. Until rash cleared Advised patient to check pets for rash and keep them clean. Wash items that have come in contact with animals skin.  Polyuria Orders: -     POCT urinalysis dipstick No infection noted.   Return if symptoms worsen or fail to improve.  Lance Bosch, NP 08/04/2014 7:32 PM

## 2014-08-05 ENCOUNTER — Ambulatory Visit: Payer: No Typology Code available for payment source | Admitting: Physical Therapy

## 2014-08-05 DIAGNOSIS — M545 Low back pain: Secondary | ICD-10-CM

## 2014-08-05 DIAGNOSIS — M436 Torticollis: Secondary | ICD-10-CM

## 2014-08-05 DIAGNOSIS — R29898 Other symptoms and signs involving the musculoskeletal system: Secondary | ICD-10-CM

## 2014-08-05 DIAGNOSIS — M25611 Stiffness of right shoulder, not elsewhere classified: Secondary | ICD-10-CM

## 2014-08-05 DIAGNOSIS — M62830 Muscle spasm of back: Secondary | ICD-10-CM

## 2014-08-05 DIAGNOSIS — M2560 Stiffness of unspecified joint, not elsewhere classified: Secondary | ICD-10-CM

## 2014-08-05 DIAGNOSIS — M2569 Stiffness of other specified joint, not elsewhere classified: Secondary | ICD-10-CM

## 2014-08-05 DIAGNOSIS — M542 Cervicalgia: Secondary | ICD-10-CM

## 2014-08-05 NOTE — Therapy (Signed)
[facility] [place], [place], [address] Phone: [phone]   Fax:  [phone]  Physical Therapy Treatment  Patient Details  Name: Cheryl King MRN: [id number] Date of Birth: 13-Apr-1965 Referring Provider:  Lance Bosch, NP  Encounter Date: 08/05/2014      PT End of Session - 08/05/14 1833    Visit Number 23   Number of Visits 30   Date for PT Re-Evaluation 08/07/14   PT Start Time 0130   PT Stop Time 0230   PT Time Calculation (min) 60 min   Activity Tolerance Patient tolerated treatment well   Behavior During Therapy [facility] for tasks assessed/performed      Past Medical History  Diagnosis Date  . Low back pain   . Sacroiliac inflammation   . Seasonal allergies   . Diverticulitis   . Sciatica     Past Surgical History  Procedure Laterality Date  . Ankle surgery      lt.  Marland Kitchen Uretherl      uretrral diverticultis  . Abdominal hysterectomy    . Abdominal surgery    . Right breast cyst removed       There were no vitals filed for this visit.  Visit Diagnosis:  Muscle spasm of back  Bilateral low back pain, with sciatica presence unspecified  Decreased ROM of trunk and back  Weakness of both legs  Shoulder weakness  Neck pain  Neck stiffness  Decreased ROM of neck  Decreased shoulder mobility, right      Subjective Assessment - 08/05/14 1335    Subjective Neck pain 5/10, back 6/10 , Tressia believes the traction lumbar and dry needling help with the pain.  She states she feels like she has only recieved 2 dry needling treatments for gluteal.    Currently in Pain? Yes   Pain Score 5    Pain Location Neck   Pain Orientation Right;Left   Pain Descriptors / Indicators Aching   Pain Type Chronic pain   Pain Onset More than a month ago   Pain Frequency Intermittent   Pain Score 6   Pain Location Back   Pain Orientation Right;Left;Mid  more Right than left today   Pain  Descriptors / Indicators Aching   Pain Onset More than a month ago            [facility] PT Assessment - 08/05/14 1339    Observation/Other Assessments   Focus on Therapeutic Outcomes (FOTO)  Foto for 07/29/14 intake 47 limitation 53% predicted 47%   Posture/Postural Control   Posture Comments Pt with R elevated quadratus lumborum    AROM   Cervical Flexion 60   Cervical Extension 50   Cervical - Right Side Bend 48   Cervical - Left Side Bend 45   Cervical - Right Rotation 55   Cervical - Left Rotation 45   Lumbar Flexion 85   Lumbar Extension 30  ERP   Lumbar - Right Side Bend 27  Pain 6/10 bil left and right   Lumbar - Left Side Bend 25   Lumbar - Right Rotation 50%   Lumbar - Left Rotation 50%                     [facility] Adult PT Treatment/Exercise - 08/05/14 1350    Lumbar Exercises: Supine   Other Supine Lumbar Exercises decompression exercises , decomp position, shoulder press, head press and leg press and leg lengthener   Lumbar  Exercises: Sidelying   Other Sidelying Lumbar Exercises R QL with left sidelying over pillow for at least 3 min for stretch          Trigger Point Dry Needling - 08/05/14 1359    Consent Given? Yes   Education Handout Provided No  previously given   Muscles Treated Lower Body Gluteus maximus  Quadratus Lumborum Right   Gluteus Maximus Response Palpable increased muscle length  bil   Piriformis Response Palpable increased muscle length              PT Education - 08/05/14 1400    Education provided Yes   Education Details Pt added HEP with R quadrtus lumborum stretch and Decompression exercises   Person(s) Educated Patient   Methods Explanation;Demonstration;Handout;Verbal cues   Comprehension Verbalized understanding;Returned demonstration          PT Short Term Goals - 08/01/14 1708    PT SHORT TERM GOAL #1   Title pt will be I with Basic HEP 05/27/2014   Status Achieved   PT SHORT TERM GOAL #2   Title she  will increase Cervical rotation bil by > 10 degrees to assist with safety during driving 0/05/3974   Status Achieved   PT SHORT TERM GOAL #3   Title pt will decreased neck pain to <5/10 to assist with functional exercise 05/27/2014   Status Achieved   PT SHORT TERM GOAL #4   Title pt will increase R shoulder flexion/abduction to > 4-/5 to assist with ADL's 05/27/2014   Status Achieved   PT SHORT TERM GOAL #5   Title (low back) pt will decrease low back pain to < 5/10 during and following standing and walking for > 5-10 min to assist with increased funcitonal endurance ( 07/17/2014)   Time 3   Period Weeks   Status On-going   PT SHORT TERM GOAL #6   Title (low back) She wil lincreaes trunk mobility by > 8 degrees to assist with ADLs (07/17/2014)   Baseline 3   Period Weeks   Status On-going   PT SHORT TERM GOAL #7   Title (low back) pt will tolerated trunk mobility in all planes with no radiation of symtoms below bil glutes to assist with funcitonal progress (07/17/2014)   Time 3   Period Weeks   Status On-going           PT Long Term Goals - 08/01/14 1709    PT LONG TERM GOAL #1   Title pt will be I with advanced HEP 07/06/2014   Time 8   Period Weeks   Status On-going   PT LONG TERM GOAL #2   Title pt will Increase overall cervical mobility by >20 degrees in all planes to assist with functional mobility and safety during driving 7/34/1937   Status Achieved   PT LONG TERM GOAL #3   Title pt will decrease cervical pain to <3/10 to assist with ADLs, and functional exercises 07/06/2014   Time 8   Period Weeks   Status On-going   PT LONG TERM GOAL #4   Title pt will increase R shoulder strength to >4+/5 to help with ADLs 07/06/2014   Time 8   Period Weeks   Status On-going   PT LONG TERM GOAL #5   Title pt will be able to verbalize and demonstrate techniques to reduce risk or cervical reinjury by postural awareness, and HEP   Status Achieved   PT LONG TERM GOAL #6   Title (  low  back) pt will increase trunk mobility to [facility] to assist with  ADLS (04/27/7351)   Time 6   Period Weeks   Status On-going   PT LONG TERM GOAL #7   Title (low back) pt will be able to tolerate walking/ standing for > 30 minutes and/or >10 steps to assist with funcitonal endurance and community amb (08/07/2014)   Time 6   Period Weeks   Status On-going   PT LONG TERM GOAL #8   Title (low back) pt will increae bil LE strength to > 4+/5 to assist with ADLs including shopping and traveling (08/07/2014)   Time 6   Period Weeks   Status On-going   PT LONG TERM GOAL  #9   TITLE (low back) she will be able to verbalize and demonstrate techniques to reduce low back pain via postural awareness, lifting and carrying mechanics, and HEp (08/07/2014)   Time 6   Period Weeks   Status On-going               Plan - 08/05/14 1838    Clinical Impression Statement Pt reports that she feels "a little better" from lumbar traction and dry needling but dry needling may benefit her more.  Pt was told that dry needling is beneficial for same area usually not more than 6 times if pain levels seem to be then same.  Pt was educated  on possible use of TENS unit for pain control.  AROM of cervical and  llumbar  are well within normal limits but pt does have slight elevated R pelvic level with minimal shearing force /tightened R Quadruatus lumborum. Pt instructed on Decompression exercise for entire spine for HEP/self pain control/modulation.  Pt  informed that next visit will need to be re evaluation of benefit of dry needling / or possible purchase of TENS unit since pain control  is most significant issue at this time.  If no progress toward goals , DC and return to MD for further pain management   Pt will benefit from skilled therapeutic intervention in order to improve on the following deficits Pain;Improper body mechanics;Postural dysfunction;Impaired flexibility;Decreased activity tolerance;Decreased  endurance;Decreased range of motion;Decreased strength;Hypomobility;Impaired UE functional use;Decreased mobility   Rehab Potential Good   PT Frequency 2x / week   PT Duration 4 weeks   PT Treatment/Interventions ADLs/Self Care Home Management;Moist Heat;Therapeutic activities;Patient/family education;Passive range of motion;Therapeutic exercise;Ultrasound;Balance training;Manual techniques;Cryotherapy;Dry needling;Neuromuscular re-education;Electrical Stimulation;Other (comment)   PT Next Visit Plan objective measurements, certification period ending.  Assess progress toward goals, educate on TENS unit if necessary, if no further progress discharge from PT   Consulted and Agree with Plan of Care Patient        Problem List Patient Active Problem List   Diagnosis Date Noted  . Smoking 12/26/2013  . Chronic lower back pain 04/13/2013  . Sciatica 04/13/2013  . Chronic neck pain 04/13/2013  . Spondylosis of cervical spine 04/13/2013    Voncille Lo, PT 08/05/2014 6:48 PM Phone: [phone] Fax: [facility] [place], [place], [address] Phone: [phone]   Fax:  [phone]

## 2014-08-05 NOTE — Patient Instructions (Signed)
Pt given quadratus llumborum handout and Decompression exercise sheet for Home Exercise Program.  See handouts for specific instruction and review of treatment given during appointment  Voncille Lo, PT 08/05/2014 6:32 PM Phone: [phone] Fax: [phone]

## 2014-08-08 ENCOUNTER — Ambulatory Visit: Payer: No Typology Code available for payment source | Admitting: Physical Therapy

## 2014-08-08 DIAGNOSIS — M542 Cervicalgia: Secondary | ICD-10-CM

## 2014-08-08 DIAGNOSIS — M436 Torticollis: Secondary | ICD-10-CM

## 2014-08-08 DIAGNOSIS — M62830 Muscle spasm of back: Secondary | ICD-10-CM

## 2014-08-08 DIAGNOSIS — M25611 Stiffness of right shoulder, not elsewhere classified: Secondary | ICD-10-CM

## 2014-08-08 DIAGNOSIS — M2569 Stiffness of other specified joint, not elsewhere classified: Secondary | ICD-10-CM

## 2014-08-08 DIAGNOSIS — R29898 Other symptoms and signs involving the musculoskeletal system: Secondary | ICD-10-CM

## 2014-08-08 DIAGNOSIS — M2560 Stiffness of unspecified joint, not elsewhere classified: Secondary | ICD-10-CM

## 2014-08-08 DIAGNOSIS — M545 Low back pain: Secondary | ICD-10-CM

## 2014-08-08 NOTE — Therapy (Signed)
[place], [place], [address] Phone: [phone]   Fax:  [phone]  Physical Therapy Treatment  Patient Details  Name: Ananiah Maciolek MRN: [id number] Date of Birth: 1965/06/30 Referring Provider:  Lance Bosch, NP  Encounter Date: 08/08/2014      PT End of Session - 08/08/14 1234    Visit Number 25   Number of Visits 30   Date for PT Re-Evaluation 08/07/14   PT Start Time 5093   PT Stop Time 1220   PT Time Calculation (min) 35 min   Activity Tolerance Patient tolerated treatment well   Behavior During Therapy [facility] for tasks assessed/performed      Past Medical History  Diagnosis Date  . Low back pain   . Sacroiliac inflammation   . Seasonal allergies   . Diverticulitis   . Sciatica     Past Surgical History  Procedure Laterality Date  . Ankle surgery      lt.  Marland Kitchen Uretherl      uretrral diverticultis  . Abdominal hysterectomy    . Abdominal surgery    . Right breast cyst removed       There were no vitals filed for this visit.  Visit Diagnosis:  Muscle spasm of back  Bilateral low back pain, with sciatica presence unspecified  Decreased ROM of trunk and back  Shoulder weakness  Neck pain  Neck stiffness  Decreased ROM of neck  Decreased shoulder mobility, right      Subjective Assessment - 08/08/14 1152    Subjective "Neck pain is feeling a little better today, but the low back continues to being a 7/10"    Currently in Pain? Yes   Pain Score 5    Pain Location Neck   Pain Orientation Right;Left   Pain Descriptors / Indicators Aching   Pain Type Chronic pain   Pain Onset More than a month ago   Pain Frequency Intermittent   Aggravating Factors  mowing   Pain Relieving Factors nothing   Pain Score 7   Pain Location Back   Pain Orientation Right;Left;Mid   Pain Descriptors / Indicators Aching   Pain Type Chronic pain   Pain Onset More than a month ago    Pain Frequency Constant   Aggravating Factors  bending, sitting, standing movement   Pain Relieving Factors nothing changes the pain            [facility] PT Assessment - 08/08/14 1158    Observation/Other Assessments   Focus on Therapeutic Outcomes (FOTO)  51% limited   AROM   Cervical Flexion 60   Cervical Extension 50   Cervical - Right Side Bend 48   Cervical - Left Side Bend 45   Cervical - Right Rotation 55   Cervical - Left Rotation 45   Lumbar Flexion 85   Lumbar Extension 30   Lumbar - Right Side Bend 27   Lumbar - Left Side Bend 25   Lumbar - Right Rotation 50%   Lumbar - Left Rotation 50%   Strength   Right Shoulder Flexion 4/5  pain during testing   Right Shoulder Extension 5/5   Right Shoulder ABduction 4-/5  pain during testing   Right Shoulder Internal Rotation 4/5   Right Shoulder External Rotation 4/5   Left Shoulder Flexion 4/5   Left Shoulder Extension 5/5   Left Shoulder ABduction 4-/5  pain during testing   Left Shoulder Internal Rotation 4/5  mild pain  during   Left Shoulder External Rotation 4/5  mild pain during   Left Hand Grip (lbs) grip strength 58# on R and 55lbs on L.   Right Hip Flexion 3+/5   Right Hip Extension 4-/5   Right Hip ABduction 3+/5   Right Hip ADduction 4/5   Left Hip Flexion 4-/5   Left Hip ABduction 3+/5   Left Hip ADduction 4/5   Right Knee Flexion 5/5   Right Knee Extension 5/5   Left Knee Flexion 5/5   Left Knee Extension 5/5                             PT Education - 08/08/14 1233    Education provided Yes   Education Details HEP, provided handout for where to find tens unit/ home cervical traction unit.   Person(s) Educated Patient   Methods Explanation   Comprehension Verbalized understanding          PT Short Term Goals - 08/08/14 1214    PT SHORT TERM GOAL #1   Title pt will be I with Basic HEP 05/27/2014   Time 4   Period Weeks   Status Achieved   PT SHORT TERM GOAL #2   Title  she will increase Cervical rotation bil by > 10 degrees to assist with safety during driving 02/23/6387   Time 4   Period Weeks   Status Achieved   PT SHORT TERM GOAL #3   Title pt will decreased neck pain to <5/10 to assist with functional exercise 05/27/2014   Time 4   Period Weeks   Status Achieved   PT SHORT TERM GOAL #4   Title pt will increase R shoulder flexion/abduction to > 4-/5 to assist with ADL's 05/27/2014   Time 4   Period Weeks   Status Achieved   PT SHORT TERM GOAL #5   Title (low back) pt will decrease low back pain to < 5/10 during and following standing and walking for > 5-10 min to assist with increased funcitonal endurance ( 07/17/2014)   Time 3   Period Weeks   Status Not Met   PT SHORT TERM GOAL #6   Title (low back) She wil lincreaes trunk mobility by > 8 degrees to assist with ADLs (07/17/2014)   Baseline 3   Period Weeks   Status Partially Met   PT SHORT TERM GOAL #7   Title (low back) pt will tolerated trunk mobility in all planes with no radiation of symtoms below bil glutes to assist with funcitonal progress (07/17/2014)   Time 3   Period Weeks   Status Not Met           PT Long Term Goals - 08/08/14 1216    PT LONG TERM GOAL #1   Title pt will be I with advanced HEP 07/06/2014   Baseline given quadriped ex   Time 8   Period Weeks   Status Not Met   PT LONG TERM GOAL #2   Title pt will Increase overall cervical mobility by >20 degrees in all planes to assist with functional mobility and safety during driving 3/73/4287   Baseline see AROM cervical WNL   Time 8   Period Weeks   Status Achieved   PT LONG TERM GOAL #3   Title pt will decrease cervical pain to <3/10 to assist with ADLs, and functional exercises 07/06/2014   Baseline pt pain 4/10 today   Time  8   Period Weeks   Status Not Met   PT LONG TERM GOAL #4   Title pt will increase R shoulder strength to >4+/5 to help with ADLs 07/06/2014   Time 8   Period Weeks   Status Not Met   PT LONG  TERM GOAL #5   Title pt will be able to verbalize and demonstrate techniques to reduce risk or cervical reinjury by postural awareness, and HEP   Time 8   Period Days   Status Achieved   PT LONG TERM GOAL #6   Title (low back) pt will increase trunk mobility to [facility] to assist with  ADLS (7/67/2094)   Baseline must lean over to pull pants up pain at 7/10    Time 6   Period Weeks   Status Not Met   PT LONG TERM GOAL #7   Title (low back) pt will be able to tolerate walking/ standing for > 30 minutes and/or >10 steps to assist with funcitonal endurance and community amb (08/07/2014)   Baseline able to stand for 30 minutes but avoid steps   Time 6   Period Weeks   Status Not Met   PT LONG TERM GOAL #8   Title (low back) pt will increae bil LE strength to > 4+/5 to assist with ADLs including shopping and traveling (08/07/2014)   Time 6   Period Weeks   Status Not Met   PT LONG TERM GOAL  #9   TITLE (low back) she will be able to verbalize and demonstrate techniques to reduce low back pain via postural awareness, lifting and carrying mechanics, and HEp (08/07/2014)   Time 6   Period Weeks   Status Not Met               Plan - 08/08/14 1235    Clinical Impression Statement Lili presents to therapy with report that she her current pain level for low back hasn't changed, and her neck pain fluctuates depending on the activities that she has done the previous day. No new goals met. She has improved with trunk and cervical mobility, however continues to demonstrate weakness in the bil shoulders/legs with increased weakness in bil hips with pain during testing. Educated pt that considering that she has been seen for 25 visits and has made no change in her current pain level with both neck and low back, stating some relief following therapy but reports no lasting effect. spoke with pt stating that we have exhausted the benefit physical therapy and that she may have to return to the doctor to  see if there are any alternative options that he can recommend. provided printouts on where to find a home TENS unit and cervical traction. pt agreed with this plan and will be discharged from physical therapy today.    PT Home Exercise Plan HEP review, tens unit/ home traction unit printous   Consulted and Agree with Plan of Care Patient        Problem List Patient Active Problem List   Diagnosis Date Noted  . Smoking 12/26/2013  . Chronic lower back pain 04/13/2013  . Sciatica 04/13/2013  . Chronic neck pain 04/13/2013  . Spondylosis of cervical spine 04/13/2013                          PHYSICAL THERAPY DISCHARGE SUMMARY  Visits from Start of Care: 25  Current functional level related to goals / functional outcomes: FOTO  51% limited   Remaining deficits: Low back pain, neck pain, weakness of shoulders/legs.    Education / Equipment: HEP, and print outs on where to find home traction and TENS unit.   Plan: Patient agrees to discharge.  Patient goals were partially met. Patient is being discharged due to lack of progress.  ?????       Starr Lake PT, DPT, LAT, ATC  08/08/2014  12:42 PM      [facility] [address] [place], [place], [address] Phone: [phone]   Fax:  [phone]

## 2014-08-12 ENCOUNTER — Ambulatory Visit: Payer: No Typology Code available for payment source | Admitting: Physical Therapy

## 2014-08-15 ENCOUNTER — Encounter: Payer: No Typology Code available for payment source | Admitting: Physical Therapy

## 2014-08-19 ENCOUNTER — Ambulatory Visit: Payer: No Typology Code available for payment source | Attending: Internal Medicine

## 2014-08-19 ENCOUNTER — Encounter: Payer: No Typology Code available for payment source | Admitting: Physical Therapy

## 2014-08-22 ENCOUNTER — Encounter: Payer: No Typology Code available for payment source | Admitting: Physical Therapy

## 2014-09-24 ENCOUNTER — Other Ambulatory Visit: Payer: Self-pay | Admitting: Internal Medicine

## 2014-10-14 ENCOUNTER — Other Ambulatory Visit (HOSPITAL_COMMUNITY): Payer: Self-pay | Admitting: Specialist

## 2014-10-14 DIAGNOSIS — M25521 Pain in right elbow: Secondary | ICD-10-CM

## 2014-10-21 ENCOUNTER — Ambulatory Visit (HOSPITAL_COMMUNITY)
Admission: RE | Admit: 2014-10-21 | Discharge: 2014-10-21 | Disposition: A | Discharge status: Home / Self Care | Payer: No Typology Code available for payment source | Source: Ambulatory Visit | Attending: Specialist | Admitting: Specialist

## 2014-10-21 DIAGNOSIS — R200 Anesthesia of skin: Secondary | ICD-10-CM | POA: Insufficient documentation

## 2014-10-21 DIAGNOSIS — R202 Paresthesia of skin: Secondary | ICD-10-CM | POA: Insufficient documentation

## 2014-10-21 DIAGNOSIS — M25521 Pain in right elbow: Secondary | ICD-10-CM | POA: Insufficient documentation

## 2014-11-08 ENCOUNTER — Other Ambulatory Visit: Payer: Self-pay | Admitting: Internal Medicine

## 2014-11-08 DIAGNOSIS — M545 Low back pain, unspecified: Secondary | ICD-10-CM

## 2014-11-08 DIAGNOSIS — G8929 Other chronic pain: Secondary | ICD-10-CM

## 2014-11-12 NOTE — Telephone Encounter (Signed)
Pt. Called requesting a med refill on cyclobenzaprine (FLEXERIL) 10 MG tablet. Please f/u with pt.

## 2014-11-13 ENCOUNTER — Telehealth: Payer: Self-pay | Admitting: Internal Medicine

## 2014-11-13 NOTE — Telephone Encounter (Signed)
Patient called and requested a med refill for cyclobenzaprine (FLEXERIL) 10 MG tablet. Please f/u with pt.

## 2015-01-23 ENCOUNTER — Encounter: Payer: Self-pay | Admitting: Internal Medicine

## 2015-01-23 ENCOUNTER — Ambulatory Visit: Payer: No Typology Code available for payment source | Attending: Internal Medicine | Admitting: Internal Medicine

## 2015-01-23 VITALS — BP 155/82 | HR 86 | Temp 98.0°F | Resp 16 | Wt 161.8 lb

## 2015-01-23 DIAGNOSIS — Z880 Allergy status to penicillin: Secondary | ICD-10-CM | POA: Insufficient documentation

## 2015-01-23 DIAGNOSIS — Z1239 Encounter for other screening for malignant neoplasm of breast: Secondary | ICD-10-CM

## 2015-01-23 DIAGNOSIS — K5792 Diverticulitis of intestine, part unspecified, without perforation or abscess without bleeding: Secondary | ICD-10-CM | POA: Insufficient documentation

## 2015-01-23 DIAGNOSIS — M461 Sacroiliitis, not elsewhere classified: Secondary | ICD-10-CM | POA: Insufficient documentation

## 2015-01-23 DIAGNOSIS — M5430 Sciatica, unspecified side: Secondary | ICD-10-CM | POA: Insufficient documentation

## 2015-01-23 DIAGNOSIS — R109 Unspecified abdominal pain: Secondary | ICD-10-CM

## 2015-01-23 DIAGNOSIS — R4589 Other symptoms and signs involving emotional state: Secondary | ICD-10-CM

## 2015-01-23 DIAGNOSIS — F17200 Nicotine dependence, unspecified, uncomplicated: Secondary | ICD-10-CM

## 2015-01-23 DIAGNOSIS — IMO0001 Reserved for inherently not codable concepts without codable children: Secondary | ICD-10-CM

## 2015-01-23 DIAGNOSIS — Z888 Allergy status to other drugs, medicaments and biological substances status: Secondary | ICD-10-CM | POA: Insufficient documentation

## 2015-01-23 DIAGNOSIS — Z79899 Other long term (current) drug therapy: Secondary | ICD-10-CM | POA: Insufficient documentation

## 2015-01-23 DIAGNOSIS — R030 Elevated blood-pressure reading, without diagnosis of hypertension: Secondary | ICD-10-CM

## 2015-01-23 DIAGNOSIS — F329 Major depressive disorder, single episode, unspecified: Secondary | ICD-10-CM

## 2015-01-23 LAB — POCT URINALYSIS DIPSTICK
BILIRUBIN UA: NEGATIVE
Blood, UA: NEGATIVE
GLUCOSE UA: NEGATIVE
Ketones, UA: NEGATIVE
LEUKOCYTES UA: NEGATIVE
NITRITE UA: NEGATIVE
Protein, UA: NEGATIVE
Spec Grav, UA: >=1.03
Urobilinogen, UA: 0.2
pH, UA: 6

## 2015-01-23 MED ORDER — BUPROPION HCL ER (SR) 150 MG PO TB12: ORAL_TABLET | ORAL | Status: DC

## 2015-01-23 MED FILL — ?OMEPRAZOLE DR 20 MG CAPSUL: 20 | 30 days supply | Qty: 30 | Fill #5

## 2015-01-23 NOTE — Progress Notes (Signed)
Patient complains of lower abd pain for the past week

## 2015-01-23 NOTE — Progress Notes (Signed)
Patient ID: Cheryl King, female   DOB: 1965-10-21, 50 y.o.   MRN: [id number]  CC: abdominal pain  HPI: Cheryl King is a 50 y.o. female here today for a follow up visit.  Patient has past medical history of HLD and chronic pain. Patient reports that for the past week she has been having symptoms of lower abdominal pain. Pain is a pressure and she believes she may have a repeat vaginal infection today. She reports a slight odor but denies dysuria or vaginal discharge. No associated diarrhea, constipation, nausea, or vomiting. Patient reports that she has been feeling slightly depressed about life situations and has contacted a therapist recently. She also desires to quit smoking and would like to discuss her options today.   Allergies  Allergen Reactions  . Percocet [Oxycodone-Acetaminophen] Nausea Only  . Vicodin [Hydrocodone-Acetaminophen] Nausea Only  . Penicillins Rash   Past Medical History  Diagnosis Date  . Low back pain   . Sacroiliac inflammation ([place])   . Seasonal allergies   . Diverticulitis   . Sciatica    Current Outpatient Prescriptions on File Prior to Visit  Medication Sig Dispense Refill  . amitriptyline (ELAVIL) 25 MG tablet Take 1 tablet (25 mg total) by mouth at bedtime. 30 tablet 2  . atorvastatin (LIPITOR) 20 MG tablet TAKE 1 TABLET BY MOUTH DAILY 30 tablet 3  . cyclobenzaprine (FLEXERIL) 10 MG tablet Take 1 tablet (10 mg total) by mouth 3 (three) times daily. 90 tablet 2  . diclofenac (VOLTAREN) 50 MG EC tablet Take 50 mg by mouth 2 (two) times daily.    Marland Kitchen omeprazole (PRILOSEC) 20 MG capsule TAKE 1 CAPSULE BY MOUTH ONCE DAILY 30 capsule 6  . traMADol (ULTRAM) 50 MG tablet Take 50 mg by mouth every 6 (six) hours as needed.    . cholecalciferol (VITAMIN D) 400 UNITS TABS tablet Take 400 Units by mouth.    . cyclobenzaprine (FLEXERIL) 10 MG tablet TAKE 1 TABLET BY MOUTH 3 TIMES DAILY. 90 tablet 0  . ketoconazole (NIZORAL) 2 % cream Apply 1 application  topically 2 (two) times daily. Until rash cleared 30 g 1  . Lactobacillus CAPS Take 1 capsule by mouth daily.    . magnesium gluconate (MAGONATE) 500 MG tablet Take 500 mg by mouth 2 (two) times daily.    . Multiple Vitamin (MULTIVITAMIN WITH MINERALS) TABS tablet Take 1 tablet by mouth daily.    . ondansetron (ZOFRAN) 8 MG tablet Take 1 tablet (8 mg total) by mouth every 8 (eight) hours as needed for nausea or vomiting. 10 tablet 0  . Pyridoxine HCl (VITAMIN B-6 PO) Take 1 tablet by mouth every other day.     No current facility-administered medications on file prior to visit.   Family History  Problem Relation Age of Onset  . Asthma Maternal Aunt   . Diabetes Maternal Aunt   . Cancer Maternal Aunt   . Hypertension Maternal Grandmother   . Colon cancer Neg Hx   . Pancreatic cancer Neg Hx   . Rectal cancer Neg Hx   . Stomach cancer Neg Hx    Social History   Social History  . Marital Status: Single    Spouse Name: N/A  . Number of Children: N/A  . Years of Education: N/A   Occupational History  . Not on file.   Social History Main Topics  . Smoking status: Current Every Day Smoker -- 0.50 packs/day for 35 years  . Smokeless tobacco: Never Used  .  Alcohol Use: No  . Drug Use: No  . Sexual Activity: No   Other Topics Concern  . Not on file   Social History Narrative    Review of Systems: Other than what is stated in HPI, all other systems are negative.   Objective:   Filed Vitals:   01/23/15 1707  BP: 155/82  Pulse: 86  Temp: 98 F (36.7 C)  Resp: 16    Physical Exam  Abdominal: Soft. Bowel sounds are normal.  Genitourinary: Right adnexum displays no tenderness. Left adnexum displays no tenderness. Vaginal discharge found.  Lymphadenopathy:       Right: No inguinal adenopathy present.       Left: No inguinal adenopathy present.     Lab Results  Component Value Date   WBC 9.3 04/13/2013   HGB 15.9* 04/13/2013   HCT 46.0 04/13/2013   MCV 93.1  04/13/2013   PLT 226 04/13/2013   Lab Results  Component Value Date   CREATININE 0.74 04/13/2013   BUN 18 04/13/2013   NA 137 04/13/2013   K 4.3 04/13/2013   CL 101 04/13/2013   CO2 29 04/13/2013    No results found for: HGBA1C Lipid Panel     Component Value Date/Time   CHOL 194 12/26/2013 1319   TRIG 89 12/26/2013 1319   HDL 60 12/26/2013 1319   CHOLHDL 3.2 12/26/2013 1319   VLDL 18 12/26/2013 1319   LDLCALC 116* 12/26/2013 1319       Assessment and plan:   Cheryl King was seen today for abdominal pain.  Diagnoses and all orders for this visit:  Abdominal pain, unspecified abdominal location -     Urinalysis Dipstick -     Cervicovaginal ancillary only -     HIV antibody (with reflex) -     RPR  Depressed mood -    Begin buPROPion (WELLBUTRIN SR) 150 MG 12 hr tablet; Take once per day for 3 days, then twice daily after that. I have also highly encouraged patient to seek counseling. Today's PHQ-9 result is 21. Information given to LCSW to follow up with patient in 24 hours. Depression screen PHQ 2/9 01/23/2015  Decreased Interest 3  Down, Depressed, Hopeless 3  PHQ - 2 Score 6  Altered sleeping 2  Tired, decreased energy 2  Change in appetite 3  Feeling bad or failure about yourself  3  Trouble concentrating 2  Moving slowly or fidgety/restless 2  Suicidal thoughts 1  PHQ-9 Score 21    Tobacco use disorder I have started patient on Wellbutrin which may help with some depression and smoking cessation at the same time.   Elevated BP Pt's BP is elevated today but has been normal at all other visits. Encouraged smoking cessation and congratulated her on taking first steps  Breast cancer screening -     MM Digital Screening; Future    Return if symptoms worsen or fail to improve.       Lance Bosch, [facility] [phone] 01/23/2015, 5:36 PM

## 2015-01-24 LAB — HIV ANTIBODY (ROUTINE TESTING W REFLEX): HIV 1&2 Ab, 4th Generation: NONREACTIVE

## 2015-01-24 LAB — RPR

## 2015-01-24 MED FILL — BUPROPION SR 150 MG TABLET: 150 | 30 days supply | Qty: 60 | Fill #0

## 2015-01-26 LAB — CERVICOVAGINAL ANCILLARY ONLY: WET PREP (BD AFFIRM): POSITIVE — AB

## 2015-01-27 ENCOUNTER — Other Ambulatory Visit: Payer: Self-pay | Admitting: Internal Medicine

## 2015-01-27 DIAGNOSIS — A749 Chlamydial infection, unspecified: Secondary | ICD-10-CM

## 2015-01-27 LAB — CERVICOVAGINAL ANCILLARY ONLY
Chlamydia: POSITIVE — AB
NEISSERIA GONORRHEA: NEGATIVE

## 2015-01-27 MED ORDER — AZITHROMYCIN 250 MG PO TABS: 1000.0000 mg | ORAL_TABLET | Freq: Once | ORAL | Status: DC

## 2015-01-28 ENCOUNTER — Telehealth: Payer: Self-pay

## 2015-01-28 MED ORDER — METRONIDAZOLE 500 MG PO TABS: 500.0000 mg | ORAL_TABLET | Freq: Two times a day (BID) | ORAL | Status: DC

## 2015-01-28 MED FILL — ?AZITHROMYCIN 250 MG TABLET: 250 MG | 1 days supply | Qty: 4 | Fill #0

## 2015-01-28 MED FILL — metroNIDAZOLE 500 MG TABS: 500 | 7 days supply | Qty: 14 | Fill #0

## 2015-01-28 NOTE — Telephone Encounter (Signed)
-----   Message from Cheryl Bosch, NP sent at 01/27/2015  9:26 PM EST ----- Patient is positive for Chlamydia, which is a STD. I will send her treatment. Please explain the seriousness of treatment. Please complete reporting form.

## 2015-01-28 NOTE — Telephone Encounter (Signed)
-----   Message from Lance Bosch, NP sent at 01/26/2015  5:12 PM EST ----- Patient positive for Bacterial Vaginosis . Please explain this is not a STD, but a imbalance of the vaginal pH. Please send Flagyl 500 mg BID for 7 days. No refills, no alcohol while on this medication.

## 2015-01-28 NOTE — Telephone Encounter (Signed)
Spoke with patient this am Patient is aware that her pelvic exam was positive for BV And ;chlamydia [facility]

## 2015-02-05 ENCOUNTER — Other Ambulatory Visit: Payer: Self-pay | Admitting: Internal Medicine

## 2015-02-05 MED FILL — ?AMITRIPTYLINE HCL 25 MG TA: 25 | 30 days supply | Qty: 30 | Fill #4

## 2015-02-05 MED FILL — ?CYCLOBENZAPRINE 10 MG TABL: 10 | 30 days supply | Qty: 90 | Fill #0

## 2015-02-05 MED FILL — ?ATORVASTATIN 20 MG TABLET: 20 | 30 days supply | Qty: 30 | Fill #4

## 2015-02-06 MED FILL — DICLOFENAC SOD EC 50 MG TAB: 50 | 30 days supply | Qty: 90 | Fill #0

## 2015-02-14 ENCOUNTER — Ambulatory Visit: Payer: No Typology Code available for payment source | Attending: Internal Medicine

## 2015-02-18 MED FILL — ?OMEPRAZOLE DR 20 MG CAPSUL: 20 | 30 days supply | Qty: 30 | Fill #6

## 2015-02-20 ENCOUNTER — Other Ambulatory Visit: Payer: Self-pay

## 2015-02-20 DIAGNOSIS — Z1231 Encounter for screening mammogram for malignant neoplasm of breast: Secondary | ICD-10-CM

## 2015-03-06 ENCOUNTER — Other Ambulatory Visit: Payer: Self-pay | Admitting: Internal Medicine

## 2015-03-06 ENCOUNTER — Ambulatory Visit: Payer: No Typology Code available for payment source

## 2015-03-06 MED FILL — ATORVASTATIN 20 MG TABLET: 20 | 30 days supply | Qty: 30 | Fill #0

## 2015-03-06 MED FILL — AMITRIPTYLINE HCL 25 MG TAB: 25 | 30 days supply | Qty: 30 | Fill #5

## 2015-03-11 ENCOUNTER — Ambulatory Visit: Payer: No Typology Code available for payment source

## 2015-03-14 ENCOUNTER — Encounter: Payer: Self-pay | Admitting: Clinical

## 2015-03-14 NOTE — Progress Notes (Signed)
Depression screen [facility] 2/9 01/23/2015 07/25/2014 08/27/2013 04/13/2013  Decreased Interest 3 0 0 0  Down, Depressed, Hopeless 3 0 0 0  PHQ - 2 Score 6 0 0 0  Altered sleeping 2 - - -  Tired, decreased energy 2 - - -  Change in appetite 3 - - -  Feeling bad or failure about yourself  3 - - -  Trouble concentrating 2 - - -  Moving slowly or fidgety/restless 2 - - -  Suicidal thoughts 1 - - -  PHQ-9 Score 21 - - -   *No plans to end life in last 2 weeks  GAD 7 : Generalized Anxiety Score 01/23/2015  Nervous, Anxious, on Edge 2  Control/stop worrying 2  Worry too much - different things 3  Trouble relaxing 2  Restless 1  Easily annoyed or irritable 3  Afraid - awful might happen 2  Total GAD 7 Score 15

## 2015-03-24 ENCOUNTER — Other Ambulatory Visit: Payer: Self-pay | Admitting: Internal Medicine

## 2015-03-24 MED FILL — DICLOFENAC SOD EC 50 MG TAB: 50 | 30 days supply | Qty: 90 | Fill #1

## 2015-03-24 MED FILL — ?OMEPRAZOLE DR 20 MG CAPSUL: 20 | 30 days supply | Qty: 30 | Fill #0

## 2015-03-24 MED FILL — ?CYCLOBENZAPRINE 10 MG TABL: 10 | 30 days supply | Qty: 90 | Fill #1

## 2015-04-07 MED FILL — ATORVASTATIN 20 MG TABLET: 20 | 30 days supply | Qty: 30 | Fill #1

## 2015-04-07 MED FILL — AMITRIPTYLINE HCL 25 MG TAB: 25 | 30 days supply | Qty: 30 | Fill #6

## 2015-05-07 ENCOUNTER — Other Ambulatory Visit: Payer: Self-pay | Admitting: Internal Medicine

## 2015-05-07 MED FILL — ?OMEPRAZOLE DR 20 MG CAPSUL: 20 | 30 days supply | Qty: 30 | Fill #1

## 2015-05-07 MED FILL — ?AMITRIPTYLINE HCL 25 MG TA: 25 | 30 days supply | Qty: 30 | Fill #7

## 2015-05-07 MED FILL — DICLOFENAC SOD EC 50 MG TAB: 50 | 30 days supply | Qty: 90 | Fill #2

## 2015-05-07 MED FILL — ?CYCLOBENZAPRINE 10 MG TABL: 10 | 30 days supply | Qty: 90 | Fill #0

## 2015-05-07 MED FILL — ATORVASTATIN 20 MG TABLET: 20 | 30 days supply | Qty: 30 | Fill #2

## 2015-05-13 ENCOUNTER — Ambulatory Visit: Payer: No Typology Code available for payment source | Attending: Internal Medicine | Admitting: Internal Medicine

## 2015-05-13 ENCOUNTER — Encounter: Payer: Self-pay | Admitting: Internal Medicine

## 2015-05-13 ENCOUNTER — Encounter: Payer: Self-pay | Admitting: Clinical

## 2015-05-13 VITALS — BP 123/85 | HR 81 | Temp 98.0°F | Wt 156.0 lb

## 2015-05-13 DIAGNOSIS — K219 Gastro-esophageal reflux disease without esophagitis: Secondary | ICD-10-CM

## 2015-05-13 DIAGNOSIS — R300 Dysuria: Secondary | ICD-10-CM

## 2015-05-13 DIAGNOSIS — F17200 Nicotine dependence, unspecified, uncomplicated: Secondary | ICD-10-CM

## 2015-05-13 DIAGNOSIS — N898 Other specified noninflammatory disorders of vagina: Secondary | ICD-10-CM

## 2015-05-13 DIAGNOSIS — F341 Dysthymic disorder: Secondary | ICD-10-CM

## 2015-05-13 LAB — POCT URINALYSIS DIP (MANUAL ENTRY)
Bilirubin, UA: NEGATIVE
Blood, UA: NEGATIVE
Glucose, UA: NEGATIVE
LEUKOCYTES UA: NEGATIVE
NITRITE UA: NEGATIVE
PH UA: 6.5
Spec Grav, UA: 1.025
UROBILINOGEN UA: 1

## 2015-05-13 MED ORDER — NICOTINE 14 MG/24HR TD PT24: 14.0000 mg | MEDICATED_PATCH | Freq: Every day | TRANSDERMAL | Status: DC

## 2015-05-13 MED ORDER — FLUCONAZOLE 150 MG PO TABS: 150.0000 mg | ORAL_TABLET | Freq: Every day | ORAL | Status: DC

## 2015-05-13 MED FILL — FLUCONAZOLE 150 MG TABLET: 150 | 3 days supply | Qty: 3 | Fill #0

## 2015-05-13 NOTE — Patient Instructions (Addendum)
Food Choices for Gastroesophageal Reflux Disease, Adult When you have gastroesophageal reflux disease (GERD), the foods you eat and your eating habits are very important. Choosing the right foods can help ease your discomfort.  WHAT GUIDELINES DO I NEED TO FOLLOW?   Choose fruits, vegetables, whole grains, and low-fat dairy products.   Choose low-fat meat, fish, and poultry.  Limit fats such as oils, salad dressings, butter, nuts, and avocado.   Keep a food diary. This helps you identify foods that cause symptoms.   Avoid foods that cause symptoms. These may be different for everyone.   Eat small meals often instead of 3 large meals a day.   Eat your meals slowly, in a place where you are relaxed.   Limit fried foods.   Cook foods using methods other than frying.   Avoid drinking alcohol.   Avoid drinking large amounts of liquids with your meals.   Avoid bending over or lying down until 2-3 hours after eating.  WHAT FOODS ARE NOT RECOMMENDED?  These are some foods and drinks that may make your symptoms worse: Vegetables Tomatoes. Tomato juice. Tomato and spaghetti sauce. Chili peppers. Onion and garlic. Horseradish. Fruits Oranges, grapefruit, and lemon (fruit and juice). Meats High-fat meats, fish, and poultry. This includes hot dogs, ribs, ham, sausage, salami, and bacon. Dairy Whole milk and chocolate milk. Sour cream. Cream. Butter. Ice cream. Cream cheese.  Drinks Coffee and tea. Bubbly (carbonated) drinks or energy drinks. Condiments Hot sauce. Barbecue sauce.  Sweets/Desserts Chocolate and cocoa. Donuts. Peppermint and spearmint. Fats and Oils High-fat foods. This includes [place] fries and potato chips. Other Vinegar. Strong spices. This includes black pepper, white pepper, red pepper, cayenne, curry powder, cloves, ginger, and chili powder. The items listed above may not be a complete list of foods and drinks to avoid. Contact your dietitian for more  information.   This information is not intended to replace advice given to you by your health care provider. Make sure you discuss any questions you have with your health care provider.   Document Released: 07/06/2011 Document Revised: 01/25/2014 Document Reviewed: 11/08/2012 Elsevier Interactive Patient Education 2016 [place] Can Quit Smoking If you are ready to quit smoking or are thinking about it, congratulations! You have chosen to help yourself be healthier and live longer! There are lots of different ways to quit smoking. Nicotine gum, nicotine patches, a nicotine inhaler, or nicotine nasal spray can help with physical craving. Hypnosis, support groups, and medicines help break the habit of smoking. TIPS TO GET OFF AND STAY OFF CIGARETTES  Learn to predict your moods. Do not let a bad situation be your excuse to have a cigarette. Some situations in your life might tempt you to have a cigarette.  Ask friends and co-workers not to smoke around you.  Make your home smoke-free.  Never have "just one" cigarette. It leads to wanting another and another. Remind yourself of your decision to quit.  On a card, make a list of your reasons for not smoking. Read it at least the same number of times a day as you have a cigarette. Tell yourself everyday, "I do not want to smoke. I choose not to smoke."  Ask someone at home or work to help you with your plan to quit smoking.  Have something planned after you eat or have a cup of coffee. Take a walk or get other exercise to perk you up. This will help to keep you from overeating.  Try a relaxation exercise to calm you down and decrease your stress. Remember, you may be tense and nervous the first two weeks after you quit. This will pass.  Find new activities to keep your hands busy. Play with a pen, coin, or rubber band. Doodle or draw things on paper.  Brush your teeth right after eating. This will help cut down the craving for the  taste of tobacco after meals. You can try mouthwash too.  Try gum, breath mints, or diet candy to keep something in your mouth. IF YOU SMOKE AND WANT TO QUIT:  Do not stock up on cigarettes. Never buy a carton. Wait until one pack is finished before you buy another.  Never carry cigarettes with you at work or at home.  Keep cigarettes as far away from you as possible. Leave them with someone else.  Never carry matches or a lighter with you.  Ask yourself, "Do I need this cigarette or is this just a reflex?"  Bet with someone that you can quit. Put cigarette money in a piggy bank every morning. If you smoke, you give up the money. If you do not smoke, by the end of the week, you keep the money.  Keep trying. It takes 21 days to change a habit!  Talk to your doctor about using medicines to help you quit. These include nicotine replacement gum, lozenges, or skin patches.   This information is not intended to replace advice given to you by your health care provider. Make sure you discuss any questions you have with your health care provider.   Document Released: 10/31/2008 Document Revised: 03/29/2011 Document Reviewed: 10/31/2008 Elsevier Interactive Patient Education [employer].

## 2015-05-13 NOTE — Addendum Note (Signed)
Addended by: Tommas Olp B on: 05/13/2015 05:44 PM   Modules accepted: Orders

## 2015-05-13 NOTE — Addendum Note (Signed)
Addended by: Tommas Olp B on: 05/13/2015 03:33 PM   Modules accepted: Orders

## 2015-05-13 NOTE — Progress Notes (Signed)
Depression screen [facility] 2/9 05/13/2015 01/23/2015 07/25/2014 08/27/2013 04/13/2013  Decreased Interest 1 3 0 0 0  Down, Depressed, Hopeless 1 3 0 0 0  PHQ - 2 Score 2 6 0 0 0  Altered sleeping 0 2 - - -  Tired, decreased energy 0 2 - - -  Change in appetite 0 3 - - -  Feeling bad or failure about yourself  1 3 - - -  Trouble concentrating 0 2 - - -  Moving slowly or fidgety/restless 0 2 - - -  Suicidal thoughts 1 1 - - -  PHQ-9 Score 4 21 - - -    GAD 7 : Generalized Anxiety Score 05/13/2015 01/23/2015  Nervous, Anxious, on Edge 0 2  Control/stop worrying 1 2  Worry too much - different things 1 3  Trouble relaxing 0 2  Restless 0 1  Easily annoyed or irritable 1 3  Afraid - awful might happen 0 2  Total GAD 7 Score 3 15  Anxiety Difficulty Somewhat difficult -

## 2015-05-13 NOTE — Progress Notes (Signed)
Cheryl King, is a 50 y.o. female  [id number]  [id number]  DOB - August 10, 1965  Chief Complaint  Patient presents with  . Abdominal Pain    lower generalized;Dysuria;polyuria x 1 week  . Vaginal Discharge    yellow; positive odor x 1 week        Subjective:   Cheryl King is a 50 y.o. female here today for a follow up visit.  Pt last seen in clinic 1/17 for vaginal discharged, found + chlamydia and gardnella, treated w/ Azithromycin and Flagyl.  Pt states finished course.  She did not tell her partner at that time, since hasn't seen him since.  Now comes in c/o of bilateral lower quadrant abd "dull ache" comes /goes last 1 wk w/ white vaginal discharge. She is currently sexually active with 1 different female partner.    Still smoking 1/2 ppd, for years.  Welbutrin did not help her depression nor her smoking. It gave her a bad taste in mouth and stopped after few days.  She denies worsening depression since, not interested in SSRI or any other medications at this time that I offered.  She denies si/hi/ah/vh.  Has not seen therapist, doesn't think she needs to.  Patient has No headache, No chest pain, No abdominal pain - No Nausea, No new weakness tingling or numbness, No Cough - SOB.  No problems updated.  ALLERGIES: Allergies  Allergen Reactions  . Percocet [Oxycodone-Acetaminophen] Nausea Only  . Vicodin [Hydrocodone-Acetaminophen] Nausea Only  . Penicillins Rash    PAST MEDICAL HISTORY: Past Medical History  Diagnosis Date  . Low back pain   . 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 Seasonal allergies   . Diverticulitis   . Sciatica     MEDICATIONS AT HOME: Prior to Admission medications   Medication Sig Start Date End Date Taking? Authorizing Provider  amitriptyline (ELAVIL) 25 MG tablet Take 1 tablet (25 mg total) by mouth at bedtime. 02/26/14  Yes Lance Bosch, NP  atorvastatin (LIPITOR) 20 MG tablet TAKE 1 TABLET BY MOUTH DAILY 03/06/15  Yes Lance Bosch, NP  cholecalciferol (VITAMIN D) 400 UNITS TABS tablet Take 400 Units by mouth.   Yes Historical Provider, MD  cyclobenzaprine (FLEXERIL) 10 MG tablet Take 1 tablet (10 mg total) by mouth 3 (three) times daily. Needs office visit 05/07/15  Yes Tresa Garter, MD  diclofenac (VOLTAREN) 50 MG EC tablet Take 50 mg by mouth 2 (two) times daily.   Yes Historical Provider, MD  ketoconazole (NIZORAL) 2 % cream Apply 1 application topically 2 (two) times daily. Until rash cleared 07/25/14  Yes Lance Bosch, NP  Lactobacillus CAPS Take 1 capsule by mouth daily.   Yes Historical Provider, MD  magnesium gluconate (MAGONATE) 500 MG tablet Take 500 mg by mouth 2 (two) times daily.   Yes Historical Provider, MD  Multiple Vitamin (MULTIVITAMIN WITH MINERALS) TABS tablet Take 1 tablet by mouth daily.   Yes Historical Provider, MD  omeprazole (PRILOSEC) 20 MG capsule TAKE 1 CAPSULE BY MOUTH ONCE DAILY 03/24/15  Yes Lance Bosch, NP  ondansetron (ZOFRAN) 8 MG tablet Take 1 tablet (8 mg total) by mouth every 8 (eight) hours as needed for nausea or vomiting. 01/30/14  Yes Mercedes Camprubi-Soms, PA-C  pregabalin (LYRICA) 50 MG capsule Take 50 mg by mouth at bedtime.   Yes Historical Provider, MD  Pyridoxine HCl (VITAMIN B-6 PO) Take 1 tablet by mouth every other day.   Yes Historical Provider, MD  traMADol (ULTRAM) 50 MG tablet Take 50 mg by mouth every 6 (six) hours as needed.   Yes Historical Provider, MD  azithromycin (ZITHROMAX) 250 MG tablet Take 4 tablets (1,000 mg total) by mouth once. Patient not taking: Reported on 05/13/2015 01/27/15   Lance Bosch, NP  fluconazole (DIFLUCAN) 150 MG tablet Take 1 tablet (150 mg total) by mouth daily. 05/13/15   Maren Reamer, MD  metroNIDAZOLE (FLAGYL) 500 MG tablet Take 1 tablet (500 mg total) by mouth 2 (two) times daily. Patient not taking: Reported on 05/13/2015 01/28/15   Lance Bosch, NP  nicotine (NICODERM CQ) 14 mg/24hr patch Place 1 patch (14 mg total)  onto the skin daily. 05/13/15   Maren Reamer, MD     Objective:   Filed Vitals:   05/13/15 1427  BP: 123/85  Pulse: 81  Temp: 98 F (36.7 C)  TempSrc: Oral  Weight: 156 lb (70.761 kg)   chaparone w/ CMA during exam.  Exam General appearance : Awake, alert, not in any distress. Speech Clear. Not toxic looking, aaox3, pleasant. In good spirits. HEENT: Atraumatic and Normocephalic, pupils equally reactive to light. Neck: supple, no JVD. No cervical lymphadenopathy.  Chest:Good air entry bilaterally, no added sounds. CVS: S1 S2 regular, no murmurs/gallups or rubs. Abdomen: Bowel sounds active, Non tender and not distended with no gaurding, rigidity or rebound. Pelvic Exam:  external genitalia normal, no adnexal masses or tenderness, no cervical motion tenderness, rectovaginal septum normal, vagina with thick white discharge, no odor.  Sp hysterectomy.  Extremities: B/L Lower Ext shows no edema, both legs are warm to touch Neurology: Awake alert, and oriented X 3, CN II-XII grossly intact, Non focal, ambulates w/o difficulty. Skin:No Rash  Data Review No results found for: HGBA1C  Depression screen [facility] 2/9 05/13/2015 01/23/2015 07/25/2014 08/27/2013 04/13/2013  Decreased Interest 1 3 0 0 0  Down, Depressed, Hopeless 1 3 0 0 0  PHQ - 2 Score 2 6 0 0 0  Altered sleeping 0 2 - - -  Tired, decreased energy 0 2 - - -  Change in appetite 0 3 - - -  Feeling bad or failure about yourself  1 3 - - -  Trouble concentrating 0 2 - - -  Moving slowly or fidgety/restless 0 2 - - -  Suicidal thoughts 1 1 - - -  PHQ-9 Score 4 21 - - -      Assessment & Plan   1. Dysuria - ua reviewed, neg nit/le.  No uti. - POCT urinalysis dipstick  2. Vaginal discharge, hx of Chlamydia/Gardnella infection 01/30/15 - sp vag exam today, sent for STD/BV labs again - will treat for candiadiasis w/ diflucan x 3 days, but suspect will need to call in abx tomorw. -dw pt risk of STDs and importance of having  sexual partners treated. - no pid on exam. - GC/Chlamydia Probe Amp - POCT urinalysis dipstick - Cytology - PAP  3. 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 Not interested in ssri/etc for now. No si/hi.  4. tob dependence - has tried nicoderm patch in past, also Chantix, did not like Welbutrin b/c of bad taste in mouth - tob cessation counseling given again - trial nicoderm patch.  5. gerd - longstanding, gerd diet dw pt.  She sounds very knowledgable about foods she should avoid but she loves all these foods so has had time to avoid - encouraged her to try  to wean down on ppi, long term use correlated to CKD somehow.   Patient have been counseled extensively about nutrition and exercise  Return in about 3 months (around 08/12/2015), or if symptoms worsen or fail to improve.  The patient was given clear instructions to go to ER or return to medical center if symptoms don't improve, worsen or new problems develop. The patient verbalized understanding. The patient was told to call to get lab results if they haven't heard anything in the next week.    Maren Reamer, MD, [place] [facility] [place], [place]   05/13/2015, 2:58 PM

## 2015-05-14 ENCOUNTER — Other Ambulatory Visit: Payer: Self-pay | Admitting: Internal Medicine

## 2015-05-14 ENCOUNTER — Telehealth: Payer: Self-pay

## 2015-05-14 LAB — CERVICOVAGINAL ANCILLARY ONLY: WET PREP (BD AFFIRM): POSITIVE — AB

## 2015-05-14 MED ORDER — METRONIDAZOLE 500 MG PO TABS: 500.0000 mg | ORAL_TABLET | Freq: Two times a day (BID) | ORAL | Status: DC

## 2015-05-14 MED FILL — metroNIDAZOLE 500 MG TABS: 500 | 7 days supply | Qty: 14 | Fill #0

## 2015-05-20 ENCOUNTER — Telehealth: Payer: Self-pay

## 2015-05-20 NOTE — Telephone Encounter (Signed)
Clld pt - advsd of need to recollect GC/CH due to original specimen being misplaced at the lab. Pt stated she understood and would come in Wednesday 05/21/2015 at 9 am.

## 2015-05-21 ENCOUNTER — Ambulatory Visit: Payer: No Typology Code available for payment source | Attending: Internal Medicine | Admitting: Internal Medicine

## 2015-05-21 DIAGNOSIS — N898 Other specified noninflammatory disorders of vagina: Secondary | ICD-10-CM

## 2015-05-21 NOTE — Progress Notes (Signed)
This was a lab recollection visit. No charge.

## 2015-05-22 LAB — URINE CYTOLOGY ANCILLARY ONLY
CHLAMYDIA, DNA PROBE: NEGATIVE
Neisseria Gonorrhea: NEGATIVE

## 2015-06-09 MED FILL — ATORVASTATIN 20 MG TABLET: 20 | 30 days supply | Qty: 30 | Fill #3

## 2015-06-09 MED FILL — AMITRIPTYLINE HCL 25 MG TAB: 25 | 30 days supply | Qty: 30 | Fill #8

## 2015-06-24 MED FILL — DICLOFENAC SOD EC 50 MG TAB: 50 | 30 days supply | Qty: 90 | Fill #3

## 2015-06-24 MED FILL — ?CYCLOBENZAPRINE 10 MG TABL: 10 | 30 days supply | Qty: 90 | Fill #1

## 2015-07-09 ENCOUNTER — Other Ambulatory Visit: Payer: Self-pay | Admitting: Internal Medicine

## 2015-07-09 MED FILL — AMITRIPTYLINE HCL 25 MG TAB: 25 | 30 days supply | Qty: 30 | Fill #9

## 2015-07-09 MED FILL — ATORVASTATIN 20 MG TABLET: 20 | 30 days supply | Qty: 30 | Fill #0

## 2015-07-30 MED FILL — METHYLPREDNISOLONE 4 MG TAB: 4 | 6 days supply | Qty: 21 | Fill #0

## 2015-07-30 MED FILL — VOLTAREN 1% GEL: 1 | 15 days supply | Qty: 100 | Fill #0

## 2015-07-30 MED FILL — DICLOFENAC SOD DR 75 MG TAB: 75 | 30 days supply | Qty: 60 | Fill #0

## 2015-08-06 ENCOUNTER — Other Ambulatory Visit: Payer: Self-pay | Admitting: Internal Medicine

## 2015-08-06 MED FILL — ?CYCLOBENZAPRINE 10 MG TABL: 10 | 30 days supply | Qty: 90 | Fill #0

## 2015-08-06 MED FILL — ATORVASTATIN 20 MG TABLET: 20 | 30 days supply | Qty: 30 | Fill #1

## 2015-08-06 MED FILL — ?AMITRIPTYLINE HCL 25 MG TA: 25 | 30 days supply | Qty: 30 | Fill #10

## 2015-08-06 NOTE — Telephone Encounter (Signed)
Request for flexeril

## 2015-08-14 ENCOUNTER — Ambulatory Visit: Payer: No Typology Code available for payment source | Attending: Internal Medicine

## 2015-08-18 ENCOUNTER — Ambulatory Visit: Payer: No Typology Code available for payment source | Admitting: Internal Medicine

## 2015-08-22 ENCOUNTER — Ambulatory Visit: Payer: No Typology Code available for payment source | Attending: Internal Medicine | Admitting: Internal Medicine

## 2015-08-22 ENCOUNTER — Encounter: Payer: Self-pay | Admitting: Internal Medicine

## 2015-08-22 VITALS — BP 100/68 | HR 87 | Temp 98.1°F | Resp 17 | Wt 154.8 lb

## 2015-08-22 DIAGNOSIS — E669 Obesity, unspecified: Secondary | ICD-10-CM

## 2015-08-22 DIAGNOSIS — H9203 Otalgia, bilateral: Secondary | ICD-10-CM | POA: Insufficient documentation

## 2015-08-22 DIAGNOSIS — Z23 Encounter for immunization: Secondary | ICD-10-CM

## 2015-08-22 DIAGNOSIS — Z1239 Encounter for other screening for malignant neoplasm of breast: Secondary | ICD-10-CM

## 2015-08-22 DIAGNOSIS — F17210 Nicotine dependence, cigarettes, uncomplicated: Secondary | ICD-10-CM | POA: Insufficient documentation

## 2015-08-22 DIAGNOSIS — Z720 Tobacco use: Secondary | ICD-10-CM

## 2015-08-22 DIAGNOSIS — Z880 Allergy status to penicillin: Secondary | ICD-10-CM | POA: Insufficient documentation

## 2015-08-22 DIAGNOSIS — Z1159 Encounter for screening for other viral diseases: Secondary | ICD-10-CM

## 2015-08-22 DIAGNOSIS — Z1211 Encounter for screening for malignant neoplasm of colon: Secondary | ICD-10-CM

## 2015-08-22 DIAGNOSIS — Z79899 Other long term (current) drug therapy: Secondary | ICD-10-CM | POA: Insufficient documentation

## 2015-08-22 DIAGNOSIS — F17200 Nicotine dependence, unspecified, uncomplicated: Secondary | ICD-10-CM

## 2015-08-22 MED ORDER — PREGABALIN 50 MG PO CAPS: 50.0000 mg | ORAL_CAPSULE | Freq: Every day | ORAL | 3 refills | Status: DC

## 2015-08-22 MED ORDER — NICOTINE 14 MG/24HR TD PT24: 14.0000 mg | MEDICATED_PATCH | Freq: Every day | TRANSDERMAL | 3 refills | Status: DC

## 2015-08-22 MED ORDER — VARENICLINE TARTRATE 1 MG PO TABS: 1.0000 mg | ORAL_TABLET | Freq: Two times a day (BID) | ORAL | 2 refills | Status: DC

## 2015-08-22 NOTE — Progress Notes (Signed)
Cheryl King, is a 50 y.o. female  [id number]  [id number]  DOB - 19-Nov-1965  Chief Complaint  Patient presents with  . Ear Pain        Subjective:   Cheryl King is a 50 y.o. female here today for a follow up visit, for f/u of tob smoking, last seen 4/17. Per pt, never able to pick up nicoderm patch, still smoking 1/2 pack /day.  She is trying to lose weight, but admits to not exercising or watching her diet, +sedantary lifestyle.  Never did he colonoscopy or MM as ordered last time. D/w pt at length re recds, and she is interested now.  Of note, she is ok w/ tdap today as well.  C/o of bilateral ear wax, thought she needed ear irrigation.  Patient has No headache, No chest pain, No abdominal pain - No Nausea, No new weakness tingling or numbness, No Cough - SOB.  No problems updated.  ALLERGIES: Allergies  Allergen Reactions  . Percocet [Oxycodone-Acetaminophen] Nausea Only  . Vicodin [Hydrocodone-Acetaminophen] Nausea Only  . Penicillins Rash    PAST MEDICAL HISTORY: Past Medical History:  Diagnosis Date  . Diverticulitis   . Low back pain   . 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 Sciatica   . Seasonal allergies     MEDICATIONS AT HOME: Prior to Admission medications   Medication Sig Start Date End Date Taking? Authorizing Provider  amitriptyline (ELAVIL) 25 MG tablet Take 1 tablet (25 mg total) by mouth at bedtime. 02/26/14  Yes Lance Bosch, NP  atorvastatin (LIPITOR) 20 MG tablet TAKE 1 TABLET BY MOUTH DAILY 07/09/15  Yes Tresa Garter, MD  cyclobenzaprine (FLEXERIL) 10 MG tablet TAKE 1 TABLET BY MOUTH 3 TIMES DAILY. NEEDS OFFICE VISIT 08/06/15  Yes Maren Reamer, MD  diclofenac (VOLTAREN) 50 MG EC tablet Take 50 mg by mouth 2 (two) times daily.   Yes Historical Provider, MD  fluconazole (DIFLUCAN) 150 MG tablet Take 1 tablet (150 mg total) by mouth daily. 05/13/15  Yes Maren Reamer, MD  Lactobacillus CAPS Take 1 capsule by mouth  daily.   Yes Historical Provider, MD  magnesium gluconate (MAGONATE) 500 MG tablet Take 500 mg by mouth 2 (two) times daily.   Yes Historical Provider, MD  Multiple Vitamin (MULTIVITAMIN WITH MINERALS) TABS tablet Take 1 tablet by mouth daily.   Yes Historical Provider, MD  ondansetron (ZOFRAN) 8 MG tablet Take 1 tablet (8 mg total) by mouth every 8 (eight) hours as needed for nausea or vomiting. 01/30/14  Yes Mercedes Camprubi-Soms, PA-C  pregabalin (LYRICA) 50 MG capsule Take 1 capsule (50 mg total) by mouth at bedtime. 08/22/15  Yes Maren Reamer, MD  traMADol (ULTRAM) 50 MG tablet Take 50 mg by mouth every 6 (six) hours as needed.   Yes Historical Provider, MD  azithromycin (ZITHROMAX) 250 MG tablet Take 4 tablets (1,000 mg total) by mouth once. Patient not taking: Reported on 05/13/2015 01/27/15   Lance Bosch, NP  cholecalciferol (VITAMIN D) 400 UNITS TABS tablet Take 400 Units by mouth.    Historical Provider, MD  ketoconazole (NIZORAL) 2 % cream Apply 1 application topically 2 (two) times daily. Until rash cleared Patient not taking: Reported on 08/22/2015 07/25/14   Lance Bosch, NP  metroNIDAZOLE (FLAGYL) 500 MG tablet Take 1 tablet (500 mg total) by mouth 2 (two) times daily. Patient not taking: Reported on 05/13/2015 01/28/15   Lance Bosch, NP  metroNIDAZOLE (FLAGYL) 500 MG  tablet Take 1 tablet (500 mg total) by mouth 2 (two) times daily. Patient not taking: Reported on 08/22/2015 05/14/15   Maren Reamer, MD  Pyridoxine HCl (VITAMIN B-6 PO) Take 1 tablet by mouth every other day.    Historical Provider, MD  varenicline (CHANTIX CONTINUING MONTH PAK) 1 MG tablet Take 1 tablet (1 mg total) by mouth 2 (two) times daily. Day 1-3: 1/2 tab daily, than 1/2 tab tx day x 4 days, than 1mg  bid afterwards 08/22/15   Maren Reamer, MD     Objective:   Vitals:   08/22/15 1117  BP: 100/68  Pulse: 87  Resp: 17  Temp: 98.1 F (36.7 C)  TempSrc: Oral  SpO2: 93%  Weight: 154 lb 12.8 oz (70.2  kg)    Exam General appearance : Awake, alert, not in any distress. Speech Clear. Not toxic looking, obese, pleasant, good spirits. HEENT: Atraumatic and Normocephalic, pupils equally reactive to light.  bilat TMs clear, very minimum amt of ceruminosis on bilat edges of ear cannal. Neck: supple, no JVD. No cervical lymphadenopathy.  Chest:Good air entry bilaterally, no added sounds. CVS: S1 S2 regular, no murmurs/gallups or rubs. Abdomen: Bowel sounds active, obese, Non tender and not distended with no gaurding, rigidity or rebound. Extremities: B/L Lower Ext shows no edema, both legs are warm to touch Neurology: Awake alert, and oriented X 3, CN II-XII grossly intact, Non focal Skin:No Rash  Data Review No results found for: HGBA1C  Depression screen [facility] 2/9 08/22/2015 05/13/2015 01/23/2015 07/25/2014 08/27/2013  Decreased Interest 1 1 3  0 0  Down, Depressed, Hopeless 1 1 3  0 0  PHQ - 2 Score 2 2 6  0 0  Altered sleeping 3 0 2 - -  Tired, decreased energy 1 0 2 - -  Change in appetite 0 0 3 - -  Feeling bad or failure about yourself  1 1 3  - -  Trouble concentrating 1 0 2 - -  Moving slowly or fidgety/restless 0 0 2 - -  Suicidal thoughts 1 1 1  - -  PHQ-9 Score 9 4 21  - -      Assessment & Plan   1. Smoking Tried welbutrin prior and did not help, never able to pick up nicoderm patch. She wants to try Chantix, she tried it in past in [place] and had good results.  Ok to try, rx made, d/w pt to call me or stop rx if starts having weird/abnml dreams, other concerning s/es.  2. Breast cancer screening dw w/ pt at length, she is willing to get done now. - MM Digital Screening; Future  3. Colon cancer screening She is still hesitant about this, but willing to give it a try again. - Ambulatory referral to Gastroenterology  4. Obesity Recd food diary, small frequent meals, calorie counting and increasing her exercise.  5. Need for hepatitis C screening test - Hepatitis C antibody  6.  tdap today.  7. Chronic neuropathy -  On lyrica, apply for PASS program with, refilled lyrica rx.  Patient have been counseled extensively about nutrition and exercise  Return in about 3 months (around 11/22/2015).  The patient was given clear instructions to go to ER or return to medical center if symptoms don't improve, worsen or new problems develop. The patient verbalized understanding. The patient was told to call to get lab results if they haven't heard anything in the next week.   This note has been created with Museum/gallery curator  and smart Company secretary. Any transcriptional errors are unintentional.   Maren Reamer, MD, [place] and [facility] [place], [place]   08/22/2015, 12:19 PM

## 2015-08-22 NOTE — Patient Instructions (Signed)
You Can Quit Smoking If you are ready to quit smoking or are thinking about it, congratulations! You have chosen to help yourself be healthier and live longer! There are lots of different ways to quit smoking. Nicotine gum, nicotine patches, a nicotine inhaler, or nicotine nasal spray can help with physical craving. Hypnosis, support groups, and medicines help break the habit of smoking. TIPS TO GET OFF AND STAY OFF CIGARETTES  Learn to predict your moods. Do not let a bad situation be your excuse to have a cigarette. Some situations in your life might tempt you to have a cigarette.  Ask friends and co-workers not to smoke around you.  Make your home smoke-free.  Never have "just one" cigarette. It leads to wanting another and another. Remind yourself of your decision to quit.  On a card, make a list of your reasons for not smoking. Read it at least the same number of times a day as you have a cigarette. Tell yourself everyday, "I do not want to smoke. I choose not to smoke."  Ask someone at home or work to help you with your plan to quit smoking.  Have something planned after you eat or have a cup of coffee. Take a walk or get other exercise to perk you up. This will help to keep you from overeating.  Try a relaxation exercise to calm you down and decrease your stress. Remember, you may be tense and nervous the first two weeks after you quit. This will pass.  Find new activities to keep your hands busy. Play with a pen, coin, or rubber band. Doodle or draw things on paper.  Brush your teeth right after eating. This will help cut down the craving for the taste of tobacco after meals. You can try mouthwash too.  Try gum, breath mints, or diet candy to keep something in your mouth. IF YOU SMOKE AND WANT TO QUIT:  Do not stock up on cigarettes. Never buy a carton. Wait until one pack is finished before you buy another.  Never carry cigarettes with you at work or at home.  Keep cigarettes  as far away from you as possible. Leave them with someone else.  Never carry matches or a lighter with you.  Ask yourself, "Do I need this cigarette or is this just a reflex?"  Bet with someone that you can quit. Put cigarette money in a piggy bank every morning. If you smoke, you give up the money. If you do not smoke, by the end of the week, you keep the money.  Keep trying. It takes 21 days to change a habit!  Talk to your doctor about using medicines to help you quit. These include nicotine replacement gum, lozenges, or skin patches.   This information is not intended to replace advice given to you by your health care provider. Make sure you discuss any questions you have with your health care provider.   Document Released: 10/31/2008 Document Revised: 03/29/2011 Document Reviewed: 10/31/2008 Elsevier Interactive Patient Education 2016 [place] for [employer] Calories are energy you get from the things you eat and drink. Your body uses this energy to keep you going throughout the day. The number of calories you eat affects your weight. When you eat more calories than your body needs, your body stores the extra calories as fat. When you eat fewer calories than your body needs, your body burns fat to get the energy it needs. Calorie counting means  keeping track of how many calories you eat and drink each day. If you make sure to eat fewer calories than your body needs, you should lose weight. In order for calorie counting to work, you will need to eat the number of calories that are right for you in a day to lose a healthy amount of weight per week. A healthy amount of weight to lose per week is usually 1-2 lb (0.5-0.9 kg). A dietitian can determine how many calories you need in a day and give you suggestions on how to reach your calorie goal.  WHAT IS MY MY PLAN? My goal is to have __________ calories per day.  If I have this many calories per day, I should lose  around __________ pounds per week. WHAT DO I NEED TO KNOW ABOUT CALORIE COUNTING? In order to meet your daily calorie goal, you will need to:  Find out how many calories are in each food you would like to eat. Try to do this before you eat.  Decide how much of the food you can eat.  Write down what you ate and how many calories it had. Doing this is called keeping a food log. WHERE DO I FIND CALORIE INFORMATION? The number of calories in a food can be found on a Nutrition Facts label. Note that all the information on a label is based on a specific serving of the food. If a food does not have a Nutrition Facts label, try to look up the calories online or ask your dietitian for help. HOW DO I DECIDE HOW MUCH TO EAT? To decide how much of the food you can eat, you will need to consider both the number of calories in one serving and the size of one serving. This information can be found on the Nutrition Facts label. If a food does not have a Nutrition Facts label, look up the information online or ask your dietitian for help. Remember that calories are listed per serving. If you choose to have more than one serving of a food, you will have to multiply the calories per serving by the amount of servings you plan to eat. For example, the label on a package of bread might say that a serving size is 1 slice and that there are 90 calories in a serving. If you eat 1 slice, you will have eaten 90 calories. If you eat 2 slices, you will have eaten 180 calories. HOW DO I KEEP A FOOD LOG? After each meal, record the following information in your food log:  What you ate.  How much of it you ate.  How many calories it had.  Then, add up your calories. Keep your food log near you, such as in a small notebook in your pocket. Another option is to use a mobile app or website. Some programs will calculate calories for you and show you how many calories you have left each time you add an item to the log. WHAT ARE  SOME CALORIE COUNTING TIPS?  Use your calories on foods and drinks that will fill you up and not leave you hungry. Some examples of this include foods like nuts and nut butters, vegetables, lean proteins, and high-fiber foods (more than 5 g fiber per serving).  Eat nutritious foods and avoid empty calories. Empty calories are calories you get from foods or beverages that do not have many nutrients, such as candy and soda. It is better to have a nutritious high-calorie food (such as  an avocado) than a food with few nutrients (such as a bag of chips).  Know how many calories are in the foods you eat most often. This way, you do not have to look up how many calories they have each time you eat them.  Look out for foods that may seem like low-calorie foods but are really high-calorie foods, such as baked goods, soda, and fat-free candy.  Pay attention to calories in drinks. Drinks such as sodas, specialty coffee drinks, alcohol, and juices have a lot of calories yet do not fill you up. Choose low-calorie drinks like water and diet drinks.  Focus your calorie counting efforts on higher calorie items. Logging the calories in a garden salad that contains only vegetables is less important than calculating the calories in a milk shake.  Find a way of tracking calories that works for you. Get creative. Most people who are successful find ways to keep track of how much they eat in a day, even if they do not count every calorie. WHAT ARE SOME PORTION CONTROL TIPS?  Know how many calories are in a serving. This will help you know how many servings of a certain food you can have.  Use a measuring cup to measure serving sizes. This is helpful when you start out. With time, you will be able to estimate serving sizes for some foods.  Take some time to put servings of different foods on your favorite plates, bowls, and cups so you know what a serving looks like.  Try not to eat straight from a bag or box. Doing  this can lead to overeating. Put the amount you would like to eat in a cup or on a plate to make sure you are eating the right portion.  Use smaller plates, glasses, and bowls to prevent overeating. This is a quick and easy way to practice portion control. If your plate is smaller, less food can fit on it.  Try not to multitask while eating, such as watching TV or using your computer. If it is time to eat, sit down at a table and enjoy your food. Doing this will help you to start recognizing when you are full. It will also make you more aware of what and how much you are eating. HOW CAN I CALORIE COUNT WHEN EATING OUT?  Ask for smaller portion sizes or child-sized portions.  Consider sharing an entree and sides instead of getting your own entree.  If you get your own entree, eat only half. Ask for a box at the beginning of your meal and put the rest of your entree in it so you are not tempted to eat it.  Look for the calories on the menu. If calories are listed, choose the lower calorie options.  Choose dishes that include vegetables, fruits, whole grains, low-fat dairy products, and lean protein. Focusing on smart food choices from each of the 5 food groups can help you stay on track at restaurants.  Choose items that are boiled, broiled, grilled, or steamed.  Choose water, milk, unsweetened iced tea, or other drinks without added sugars. If you want an alcoholic beverage, choose a lower calorie option. For example, a regular margarita can have up to 700 calories and a glass of wine has around 150.  Stay away from items that are buttered, battered, fried, or served with cream sauce. Items labeled "crispy" are usually fried, unless stated otherwise.  Ask for dressings, sauces, and syrups on the side. These are  usually very high in calories, so do not eat much of them.  Watch out for salads. Many people think salads are a healthy option, but this is often not the case. Many salads come with  bacon, fried chicken, lots of cheese, fried chips, and dressing. All of these items have a lot of calories. If you want a salad, choose a garden salad and ask for grilled meats or steak. Ask for the dressing on the side, or ask for olive oil and vinegar or lemon to use as dressing.  Estimate how many servings of a food you are given. For example, a serving of cooked rice is  cup or about the size of half a tennis ball or one cupcake wrapper. Knowing serving sizes will help you be aware of how much food you are eating at restaurants. The list below tells you how big or small some common portion sizes are based on everyday objects.  1 oz--4 stacked dice.  3 oz--1 deck of cards.  1 tsp--1 dice.  1 Tbsp-- a Ping-Pong ball.  2 Tbsp--1 Ping-Pong ball.   cup--1 tennis ball or 1 cupcake wrapper.  1 cup--1 baseball.   This information is not intended to replace advice given to you by your health care provider. Make sure you discuss any questions you have with your health care provider.   Document Released: 01/04/2005 Document Revised: 01/25/2014 Document Reviewed: 11/09/2012 Elsevier Interactive Patient Education 2016 [place] to [employer] Exercising can help you to lose weight. In order to lose weight through exercise, you need to do vigorous-intensity exercise. You can tell that you are exercising with vigorous intensity if you are breathing very hard and fast and cannot hold a conversation while exercising. Moderate-intensity exercise helps to maintain your current weight. You can tell that you are exercising at a moderate level if you have a higher heart rate and faster breathing, but you are still able to hold a conversation. HOW OFTEN SHOULD I EXERCISE? Choose an activity that you enjoy and set realistic goals. Your health care provider can help you to make an activity plan that works for you. Exercise regularly as directed by your health care provider. This may  include:  Doing resistance training twice each week, such as:  Push-ups.  Sit-ups.  Lifting weights.  Using resistance bands.  Doing a given intensity of exercise for a given amount of time. Choose from these options:  150 minutes of moderate-intensity exercise every week.  75 minutes of vigorous-intensity exercise every week.  A mix of moderate-intensity and vigorous-intensity exercise every week. Children, pregnant women, people who are out of shape, people who are overweight, and older adults may need to consult a health care provider for individual recommendations. If you have any sort of medical condition, be sure to consult your health care provider before starting a new exercise program. WHAT ARE SOME ACTIVITIES THAT CAN HELP ME TO LOSE WEIGHT?   Walking at a rate of at least 4.5 miles an hour.  Jogging or running at a rate of 5 miles per hour.  Biking at a rate of at least 10 miles per hour.  Lap swimming.  Roller-skating or in-line skating.  Cross-country skiing.  Vigorous competitive sports, such as football, basketball, and soccer.  Jumping rope.  Aerobic dancing. HOW CAN I BE MORE ACTIVE IN MY DAY-TO-DAY ACTIVITIES?  Use the stairs instead of the elevator.  Take a walk during your lunch break.  If you  drive, park your car farther away from work or school.  If you take public transportation, get off one stop early and walk the rest of the way.  Make all of your phone calls while standing up and walking around.  Get up, stretch, and walk around every 30 minutes throughout the day. WHAT GUIDELINES SHOULD I FOLLOW WHILE EXERCISING?  Do not exercise so much that you hurt yourself, feel dizzy, or get very short of breath.  Consult your health care provider prior to starting a new exercise program.  Wear comfortable clothes and shoes with good support.  Drink plenty of water while you exercise to prevent dehydration or heat stroke. Body water is lost  during exercise and must be replaced.  Work out until you breathe faster and your heart beats faster.   This information is not intended to replace advice given to you by your health care provider. Make sure you discuss any questions you have with your health care provider.   Document Released: 02/06/2010 Document Revised: 01/25/2014 Document Reviewed: 06/07/2013 Elsevier Interactive Patient Education 2016 [place] Maintenance, Female Adopting a healthy lifestyle and getting preventive care can go a long way to promote health and wellness. Talk with your health care provider about what schedule of regular examinations is right for you. This is a good chance for you to check in with your provider about disease prevention and staying healthy. In between checkups, there are plenty of things you can do on your own. Experts have done a lot of research about which lifestyle changes and preventive measures are most likely to keep you healthy. Ask your health care provider for more information. WEIGHT AND DIET  Eat a healthy diet  Be sure to include plenty of vegetables, fruits, low-fat dairy products, and lean protein.  Do not eat a lot of foods high in solid fats, added sugars, or salt.  Get regular exercise. This is one of the most important things you can do for your health.  Most adults should exercise for at least 150 minutes each week. The exercise should increase your heart rate and make you sweat (moderate-intensity exercise).  Most adults should also do strengthening exercises at least twice a week. This is in addition to the moderate-intensity exercise.  Maintain a healthy weight  Body mass index (BMI) is a measurement that can be used to identify possible weight problems. It estimates body fat based on height and weight. Your health care provider can help determine your BMI and help you achieve or maintain a healthy weight.  For females 40 years of age and older:    A BMI below 18.5 is considered underweight.  A BMI of 18.5 to 24.9 is normal.  A BMI of 25 to 29.9 is considered overweight.  A BMI of 30 and above is considered obese.  Watch levels of cholesterol and blood lipids  You should start having your blood tested for lipids and cholesterol at 52 years of age, then have this test every 5 years.  You may need to have your cholesterol levels checked more often if:  Your lipid or cholesterol levels are high.  You are older than 52 years of age.  You are at high risk for heart disease.  CANCER SCREENING   Lung Cancer  Lung cancer screening is recommended for adults 20-23 years old who are at high risk for lung cancer because of a history of smoking.  A yearly low-dose CT scan of the  lungs is recommended for people who:  Currently smoke.  Have quit within the past 15 years.  Have at least a 30-pack-year history of smoking. A pack year is smoking an average of one pack of cigarettes a day for 1 year.  Yearly screening should continue until it has been 15 years since you quit.  Yearly screening should stop if you develop a health problem that would prevent you from having lung cancer treatment.  Breast Cancer  Practice breast self-awareness. This means understanding how your breasts normally appear and feel.  It also means doing regular breast self-exams. Let your health care provider know about any changes, no matter how small.  If you are in your 20s or 30s, you should have a clinical breast exam (CBE) by a health care provider every 1-3 years as part of a regular health exam.  If you are 61 or older, have a CBE every year. Also consider having a breast X-ray (mammogram) every year.  If you have a family history of breast cancer, talk to your health care provider about genetic screening.  If you are at high risk for breast cancer, talk to your health care provider about having an MRI and a mammogram every year.  Breast  cancer gene (BRCA) assessment is recommended for women who have family members with BRCA-related cancers. BRCA-related cancers include:  Breast.  Ovarian.  Tubal.  Peritoneal cancers.  Results of the assessment will determine the need for genetic counseling and BRCA1 and BRCA2 testing. Cervical Cancer Your health care provider may recommend that you be screened regularly for cancer of the pelvic organs (ovaries, uterus, and vagina). This screening involves a pelvic examination, including checking for microscopic changes to the surface of your cervix (Pap test). You may be encouraged to have this screening done every 3 years, beginning at age 110.  For women ages 39-65, health care providers may recommend pelvic exams and Pap testing every 3 years, or they may recommend the Pap and pelvic exam, combined with testing for human papilloma virus (HPV), every 5 years. Some types of HPV increase your risk of cervical cancer. Testing for HPV may also be done on women of any age with unclear Pap test results.  Other health care providers may not recommend any screening for nonpregnant women who are considered low risk for pelvic cancer and who do not have symptoms. Ask your health care provider if a screening pelvic exam is right for you.  If you have had past treatment for cervical cancer or a condition that could lead to cancer, you need Pap tests and screening for cancer for at least 20 years after your treatment. If Pap tests have been discontinued, your risk factors (such as having a new sexual partner) need to be reassessed to determine if screening should resume. Some women have medical problems that increase the chance of getting cervical cancer. In these cases, your health care provider may recommend more frequent screening and Pap tests. Colorectal Cancer  This type of cancer can be detected and often prevented.  Routine colorectal cancer screening usually begins at 52 years of age and  continues through 52 years of age.  Your health care provider may recommend screening at an earlier age if you have risk factors for colon cancer.  Your health care provider may also recommend using home test kits to check for hidden blood in the stool.  A small camera at the end of a tube can be used to examine your  colon directly (sigmoidoscopy or colonoscopy). This is done to check for the earliest forms of colorectal cancer.  Routine screening usually begins at age 11.  Direct examination of the colon should be repeated every 5-10 years through 52 years of age. However, you may need to be screened more often if early forms of precancerous polyps or small growths are found. Skin Cancer  Check your skin from head to toe regularly.  Tell your health care provider about any new moles or changes in moles, especially if there is a change in a mole's shape or color.  Also tell your health care provider if you have a mole that is larger than the size of a pencil eraser.  Always use sunscreen. Apply sunscreen liberally and repeatedly throughout the day.  Protect yourself by wearing long sleeves, pants, a wide-brimmed hat, and sunglasses whenever you are outside. HEART DISEASE, DIABETES, AND HIGH BLOOD PRESSURE   High blood pressure causes heart disease and increases the risk of stroke. High blood pressure is more likely to develop in:  People who have blood pressure in the high end of the normal range (130-139/85-89 mm Hg).  People who are overweight or obese.  People who are African American.  If you are 68-57 years of age, have your blood pressure checked every 3-5 years. If you are 85 years of age or older, have your blood pressure checked every year. You should have your blood pressure measured twice--once when you are at a hospital or clinic, and once when you are not at a hospital or clinic. Record the average of the two measurements. To check your blood pressure when you are not at  a hospital or clinic, you can use:  An automated blood pressure machine at a pharmacy.  A home blood pressure monitor.  If you are between 27 years and 38 years old, ask your health care provider if you should take aspirin to prevent strokes.  Have regular diabetes screenings. This involves taking a blood sample to check your fasting blood sugar level.  If you are at a normal weight and have a low risk for diabetes, have this test once every three years after 52 years of age.  If you are overweight and have a high risk for diabetes, consider being tested at a younger age or more often. PREVENTING INFECTION  Hepatitis B  If you have a higher risk for hepatitis B, you should be screened for this virus. You are considered at high risk for hepatitis B if:  You were born in a country where hepatitis B is common. Ask your health care provider which countries are considered high risk.  Your parents were born in a high-risk country, and you have not been immunized against hepatitis B (hepatitis B vaccine).  You have HIV or AIDS.  You use needles to inject street drugs.  You live with someone who has hepatitis B.  You have had sex with someone who has hepatitis B.  You get hemodialysis treatment.  You take certain medicines for conditions, including cancer, organ transplantation, and autoimmune conditions. Hepatitis C  Blood testing is recommended for:  Everyone born from [birth year] through 1967.  Anyone with known risk factors for hepatitis C. Sexually transmitted infections (STIs)  You should be screened for sexually transmitted infections (STIs) including gonorrhea and chlamydia if:  You are sexually active and are younger than 52 years of age.  You are older than 53 years of age and your health care provider tells  you that you are at risk for this type of infection.  Your sexual activity has changed since you were last screened and you are at an increased risk for chlamydia or  gonorrhea. Ask your health care provider if you are at risk.  If you do not have HIV, but are at risk, it may be recommended that you take a prescription medicine daily to prevent HIV infection. This is called pre-exposure prophylaxis (PrEP). You are considered at risk if:  You are sexually active and do not regularly use condoms or know the HIV status of your partner(s).  You take drugs by injection.  You are sexually active with a partner who has HIV. Talk with your health care provider about whether you are at high risk of being infected with HIV. If you choose to begin PrEP, you should first be tested for HIV. You should then be tested every 3 months for as long as you are taking PrEP.  PREGNANCY   If you are premenopausal and you may become pregnant, ask your health care provider about preconception counseling.  If you may become pregnant, take 400 to 800 micrograms (mcg) of folic acid every day.  If you want to prevent pregnancy, talk to your health care provider about birth control (contraception). OSTEOPOROSIS AND MENOPAUSE   Osteoporosis is a disease in which the bones lose minerals and strength with aging. This can result in serious bone fractures. Your risk for osteoporosis can be identified using a bone density scan.  If you are 13 years of age or older, or if you are at risk for osteoporosis and fractures, ask your health care provider if you should be screened.  Ask your health care provider whether you should take a calcium or vitamin D supplement to lower your risk for osteoporosis.  Menopause may have certain physical symptoms and risks.  Hormone replacement therapy may reduce some of these symptoms and risks. Talk to your health care provider about whether hormone replacement therapy is right for you.  HOME CARE INSTRUCTIONS   Schedule regular health, dental, and eye exams.  Stay current with your immunizations.   Do not use any tobacco products including  cigarettes, chewing tobacco, or electronic cigarettes.  If you are pregnant, do not drink alcohol.  If you are breastfeeding, limit how much and how often you drink alcohol.  Limit alcohol intake to no more than 1 drink per day for nonpregnant women. One drink equals 12 ounces of beer, 5 ounces of wine, or 1 ounces of hard liquor.  Do not use street drugs.  Do not share needles.  Ask your health care provider for help if you need support or information about quitting drugs.  Tell your health care provider if you often feel depressed.  Tell your health care provider if you have ever been abused or do not feel safe at home.   This information is not intended to replace advice given to you by your health care provider. Make sure you discuss any questions you have with your health care provider.   Document Released: 07/20/2010 Document Revised: 01/25/2014 Document Reviewed: 12/06/2012 Elsevier Interactive Patient Education [employer].

## 2015-08-23 LAB — HEPATITIS C ANTIBODY: HCV Ab: NEGATIVE

## 2015-09-06 ENCOUNTER — Encounter (INDEPENDENT_AMBULATORY_CARE_PROVIDER_SITE_OTHER): Payer: Self-pay

## 2015-09-10 ENCOUNTER — Other Ambulatory Visit: Payer: Self-pay | Admitting: Internal Medicine

## 2015-09-10 MED FILL — ?ATORVASTATIN 20 MG TABLET: 20 | 30 days supply | Qty: 30 | Fill #2

## 2015-09-10 MED FILL — CYCLOBENZAPRINE 10 MG TAB: 10 | 30 days supply | Qty: 90 | Fill #0

## 2015-09-10 MED FILL — DICLOFENAC SOD DR 75 MG TAB: 75 | 30 days supply | Qty: 60 | Fill #1

## 2015-09-10 MED FILL — ?AMITRIPTYLINE HCL 25 MG TA: 25 | 19 days supply | Qty: 19 | Fill #11

## 2015-09-23 ENCOUNTER — Encounter: Payer: Self-pay | Admitting: Internal Medicine

## 2015-09-30 MED FILL — AMITRIPTYLINE HCL 25 MG TAB: 25 | 19 days supply | Qty: 19 | Fill #12

## 2015-10-03 ENCOUNTER — Ambulatory Visit: Payer: No Typology Code available for payment source | Attending: Internal Medicine

## 2015-10-03 DIAGNOSIS — Z23 Encounter for immunization: Secondary | ICD-10-CM

## 2015-10-03 NOTE — Patient Instructions (Signed)

## 2015-10-03 NOTE — Progress Notes (Signed)
Pt is here for influenza vaccine only.

## 2015-10-09 MED FILL — ATORVASTATIN 20 MG TABLET: 20 | 30 days supply | Qty: 30 | Fill #3

## 2015-10-16 MED FILL — ?DICLOFENAC SOD DR 50 MG TA: 50 | 30 days supply | Qty: 90 | Fill #0

## 2015-10-17 MED FILL — AMITRIPTYLINE HCL 25 MG TAB: 25 | 30 days supply | Qty: 30 | Fill #0

## 2015-11-10 ENCOUNTER — Other Ambulatory Visit: Payer: Self-pay | Admitting: Internal Medicine

## 2015-11-10 MED FILL — CYCLOBENZAPRINE 10 MG TAB: 10 | 30 days supply | Qty: 90 | Fill #0

## 2015-11-10 MED FILL — ?ATORVASTATIN 20 MG TABLET: 20 | 30 days supply | Qty: 30 | Fill #0

## 2015-11-10 MED FILL — AMITRIPTYLINE HCL 25 MG TAB: 25 | 30 days supply | Qty: 30 | Fill #1

## 2015-11-10 MED FILL — ?DICLOFENAC SOD DR 50 MG TA: 50 | 30 days supply | Qty: 90 | Fill #1

## 2015-11-24 ENCOUNTER — Ambulatory Visit (INDEPENDENT_AMBULATORY_CARE_PROVIDER_SITE_OTHER): Payer: Self-pay | Admitting: Specialist

## 2015-11-24 ENCOUNTER — Encounter (INDEPENDENT_AMBULATORY_CARE_PROVIDER_SITE_OTHER): Payer: Self-pay | Admitting: Specialist

## 2015-11-24 VITALS — BP 113/74 | HR 76 | Ht 67.0 in | Wt 154.0 lb

## 2015-11-24 DIAGNOSIS — R51 Headache: Secondary | ICD-10-CM

## 2015-11-24 DIAGNOSIS — M19172 Post-traumatic osteoarthritis, left ankle and foot: Secondary | ICD-10-CM

## 2015-11-24 DIAGNOSIS — G8929 Other chronic pain: Secondary | ICD-10-CM

## 2015-11-24 DIAGNOSIS — G5621 Lesion of ulnar nerve, right upper limb: Secondary | ICD-10-CM

## 2015-11-24 DIAGNOSIS — M4722 Other spondylosis with radiculopathy, cervical region: Secondary | ICD-10-CM

## 2015-11-24 DIAGNOSIS — R519 Headache, unspecified: Secondary | ICD-10-CM

## 2015-11-24 MED ORDER — AMITRIPTYLINE HCL 25 MG PO TABS: 25.0000 mg | ORAL_TABLET | Freq: Two times a day (BID) | ORAL | 2 refills | Status: DC

## 2015-11-24 MED FILL — AMITRIPTYLINE HCL 25 MG TAB: 25 | 30 days supply | Qty: 60 | Fill #0

## 2015-11-24 NOTE — Patient Instructions (Signed)
Plan:Avoid overhead lifting and overhead use of the arms. Do not lift greater than 5 lbs. Adjust head rest in vehicle to prevent hyperextension if rear ended. Take extra precautions to avoid falling, including use of a cane if you feel weak. If you have worsening arm or leg numbness or weakness please call or go to an ER. We are scheduling you for repeat test of your nerse in yo.ur arms to assess for any changes since 03/2014.

## 2015-11-24 NOTE — Progress Notes (Signed)
Office Visit Note   Patient: Cheryl King           Date of Birth: 12-22-1965           MRN: [id number] Visit Date: 11/24/2015              Requested by: Maren Reamer, MD [address] [place], [place] [address] PCP: Maren Reamer, MD   Assessment & Plan: Visit Diagnoses:  1. Other spondylosis with radiculopathy, cervical region   2. Ulnar neuropathy at elbow of right upper extremity   3. Post-traumatic osteoarthritis, left ankle and foot   4. Chronic nonintractable headache, unspecified headache type     Plan:Avoid overhead lifting and overhead use of the arms. Do not lift greater than 5 lbs. Adjust head rest in vehicle to prevent hyperextension if rear ended. Take extra precautions to avoid falling, including use of a cane if you feel weak. If you have worsening arm or leg numbness or weakness please call or go to an ER. We are scheduling you for repeat test of your nerse in yo.ur arms to assess for any changes since 03/2014.    Follow-Up Instructions: Return in about 4 weeks (around 12/22/2015) for Review nerve tests and change in medicaitons..   Orders:  Orders Placed This Encounter  Procedures  . Ambulatory referral to Physical Medicine Rehab   Meds ordered this encounter  Medications  . amitriptyline (ELAVIL) 25 MG tablet    Sig: Take 1 tablet (25 mg total) by mouth 2 (two) times daily.    Dispense:  60 tablet    Refill:  2      Procedures: No procedures performed   Clinical Data: No additional findings.   Subjective: Chief Complaint  Patient presents with  . Lower Back - Pain, Follow-up  . Neck - Pain, Follow-up    Patient returns for 3 month follow up of her low back and neck. Low back pain that radiates into bilateral legs. Complains with numbness in both legs. Neck is also still giving her trouble. States she has stiffness at times where she can hardly turn her head. States has pain and numbness in right arm. Complains with headaches  in the daytime.  Taking 50 mg lyrica helped but now she is out of  meds and requests more samples. Gabapentin breaks her out. No Bowel or bladder difficulty. Both neck and back cause pain. Taking amitryptyline, but having day time HAs. Left arm is not painful, right arm is painful. Not keeping her awake, HAs keep her up at night, had these prior to fall, Urgent Care prescribed the elavil(amitryptyline). No clumbsiness,    Review of Systems  HENT: Positive for postnasal drip, sinus pain and sinus pressure.   Eyes: Positive for redness, itching and visual disturbance. Negative for photophobia, pain and discharge.  Respiratory: Positive for apnea, choking and wheezing.   Cardiovascular: Negative.  Negative for chest pain, palpitations and leg swelling.  Gastrointestinal: Negative for abdominal distention, abdominal pain, anal bleeding, blood in stool, constipation, diarrhea, nausea, rectal pain and vomiting.  Endocrine: Negative for cold intolerance, heat intolerance, polydipsia, polyphagia and polyuria.  Genitourinary: Negative for difficulty urinating, dyspareunia, dysuria, enuresis and flank pain.  Musculoskeletal: Positive for back pain, gait problem, joint swelling, neck pain and neck stiffness.  Allergic/Immunologic: Positive for environmental allergies. Negative for food allergies and immunocompromised state.  Neurological: Positive for weakness, numbness and headaches. Negative for dizziness, tremors, seizures, syncope, facial asymmetry, speech difficulty and light-headedness.  Hematological:  Negative for adenopathy. Does not bruise/bleed easily.  Psychiatric/Behavioral: Negative for agitation, behavioral problems, confusion, decreased concentration, dysphoric mood, hallucinations, self-injury, sleep disturbance and suicidal ideas. The patient is not nervous/anxious and is not hyperactive.      Objective: Vital Signs: BP 113/74   Pulse 76   Ht 5\' 7"  (1.702 m)   Wt 154 lb (69.9 kg)    BMI 24.12 kg/m   Physical Exam  Constitutional: She is oriented to person, place, and time. She appears well-developed and well-nourished.  Eyes: EOM are normal. Pupils are equal, round, and reactive to light.  Neck: Normal range of motion. Neck supple.  Cardiovascular: Normal rate, regular rhythm, normal heart sounds and intact distal pulses.   Pulmonary/Chest: Effort normal and breath sounds normal.  Abdominal: Soft. Bowel sounds are normal.  Musculoskeletal: Normal range of motion. She exhibits no edema, tenderness or deformity.  Neurological: She is alert and oriented to person, place, and time. She displays normal reflexes. No cranial nerve deficit. She exhibits normal muscle tone. Coordination normal.  Skin: Skin is warm and dry. No rash noted. No erythema. No pallor.  Psychiatric: She has a normal mood and affect. Her behavior is normal. Judgment and thought content normal.    Back Exam   Tenderness  The patient is experiencing tenderness in the lumbar and cervical.  Range of Motion  Extension: normal  Flexion: normal   Muscle Strength  Right Quadriceps:  5/5  Left Quadriceps:  5/5  Right Hamstrings:  5/5  Left Hamstrings:  5/5   Reflexes  Patellar: 0/4 Achilles: 0/4 Biceps: 2/4 Babinski's sign: normal   Other  Toe Walk: abnormal Heel Walk: abnormal Sensation: normal Gait: antalgic   Comments:  Decreased lateral bending and turning by 30% bilaterally. Fb decreased 25%       Specialty Comments:  No specialty comments available.  Imaging: No results found.   PMFS History: Patient Active Problem List   Diagnosis Date Noted  . Chlamydia infection 01/27/2015  . Smoking 12/26/2013  . Chronic lower back pain 04/13/2013  . Sciatica 04/13/2013  . Chronic neck pain 04/13/2013  . Spondylosis of cervical spine 04/13/2013   Past Medical History:  Diagnosis Date  . Acid reflux   . Diverticulitis   . Low back pain   . 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  Sciatica   . Seasonal allergies     Family History  Problem Relation Age of Onset  . Asthma Maternal Aunt   . Diabetes Maternal Aunt   . Cancer Maternal Aunt   . Hypertension Maternal Grandmother   . Colon cancer Neg Hx   . Pancreatic cancer Neg Hx   . Rectal cancer Neg Hx   . Stomach cancer Neg Hx     Past Surgical History:  Procedure Laterality Date  . ABDOMINAL HYSTERECTOMY    . ABDOMINAL SURGERY    . ANKLE SURGERY     lt.  . right breast cyst removed     . uretherl     uretrral diverticultis   Social History   Occupational History  . Not on file.   Social History Main Topics  . Smoking status: Current Every Day Smoker    Packs/day: 0.50    Years: 35.00  . Smokeless tobacco: Never Used  . Alcohol use No  . Drug use: No  . Sexual activity: No

## 2015-12-03 MED FILL — ?OMEPRAZOLE DR 20 MG CAPSUL: 20 | 30 days supply | Qty: 30 | Fill #2

## 2015-12-17 ENCOUNTER — Other Ambulatory Visit: Payer: Self-pay | Admitting: Internal Medicine

## 2015-12-17 ENCOUNTER — Encounter (INDEPENDENT_AMBULATORY_CARE_PROVIDER_SITE_OTHER): Payer: No Typology Code available for payment source | Admitting: Physical Medicine and Rehabilitation

## 2015-12-17 MED FILL — CYCLOBENZAPRINE 10 MG TAB: 10 | 18 days supply | Qty: 55 | Fill #0

## 2015-12-22 MED FILL — ATORVASTATIN 20 MG TABLET: 20 | 30 days supply | Qty: 30 | Fill #1

## 2015-12-29 MED FILL — AMITRIPTYLINE HCL 25 MG TAB: 25 | 30 days supply | Qty: 60 | Fill #1

## 2016-01-01 ENCOUNTER — Ambulatory Visit (INDEPENDENT_AMBULATORY_CARE_PROVIDER_SITE_OTHER): Payer: Self-pay | Admitting: Specialist

## 2016-01-01 ENCOUNTER — Encounter (INDEPENDENT_AMBULATORY_CARE_PROVIDER_SITE_OTHER): Payer: Self-pay | Admitting: Specialist

## 2016-01-01 ENCOUNTER — Ambulatory Visit (INDEPENDENT_AMBULATORY_CARE_PROVIDER_SITE_OTHER): Payer: Self-pay | Admitting: Physical Medicine and Rehabilitation

## 2016-01-01 ENCOUNTER — Encounter (INDEPENDENT_AMBULATORY_CARE_PROVIDER_SITE_OTHER): Payer: Self-pay | Admitting: Physical Medicine and Rehabilitation

## 2016-01-01 VITALS — BP 115/74 | HR 75 | Ht 67.0 in | Wt 148.0 lb

## 2016-01-01 DIAGNOSIS — G5621 Lesion of ulnar nerve, right upper limb: Secondary | ICD-10-CM

## 2016-01-01 DIAGNOSIS — R200 Anesthesia of skin: Secondary | ICD-10-CM

## 2016-01-01 DIAGNOSIS — R202 Paresthesia of skin: Secondary | ICD-10-CM

## 2016-01-01 DIAGNOSIS — M47812 Spondylosis without myelopathy or radiculopathy, cervical region: Secondary | ICD-10-CM

## 2016-01-01 MED ORDER — GABAPENTIN 100 MG PO CAPS
ORAL_CAPSULE | ORAL | 6 refills | Status: DC
Start: 2016-01-01 — End: 2016-03-01

## 2016-01-01 MED FILL — GABAPENTIN 100 MG CAPSULE: 100 | 30 days supply | Qty: 90 | Fill #0

## 2016-01-01 NOTE — Progress Notes (Signed)
Office Visit Note   Patient: Cheryl King           Date of Birth: [birth year]           MRN: [id number] Visit Date: 01/01/2016              Requested by: Maren Reamer, MD [place], [place] [address] PCP: Maren Reamer, MD   Assessment & Plan: Visit Diagnoses:  1. Numbness and tingling of right arm     Plan: Hyperflexion of the elbow is usually associated with the symptoms of cubital tunnel syndome of the arm due to pressure on the the ulna nerve at the elbow when the elbow is flexed. Avoid activities that involve pulling with the arm or flexed position.  The neck condition is better if avoiding extension neck or upward glaze. Over head lifting and overhead use of the arms will tend to worsen the symptoms associated with neck arthritis.Occasionally biofreeeze is beneficial in decreasing the pain associated with Arthritis in the neck.  Ice at the end of the day and a morning hot shower is sometimes help in decreasing pain.  Follow-Up Instructions: No Follow-up on file.   Orders:  No orders of the defined types were placed in this encounter.  No orders of the defined types were placed in this encounter.     Procedures: No procedures performed   Clinical Data: No additional findings.   Subjective: Chief Complaint  Patient presents with  . Neck - Pain    Patient returns today to review EMG/NCS. States everything still the same. Having neck pain with headaches.Has had persisting pain into the right arm now for the last one year, EMG and NCV from Dr. Ernestina Patches are not showing any significant changes in the right arm ulnar n distribution. She has a history of previous removal of cyst from the right thumb at [facility], in [place], [place] in the past. Also with left wrist pain.     Review of Systems  HENT: Negative.   Eyes: Positive for visual disturbance.  Respiratory: Negative.  Negative for shortness of breath.   Cardiovascular: Negative.    Gastrointestinal: Negative.   Endocrine: Negative.   Genitourinary: Negative.   Musculoskeletal: Positive for back pain and gait problem.  Skin: Negative.   Allergic/Immunologic: Negative for environmental allergies, food allergies and immunocompromised state.  Neurological: Positive for weakness and numbness.  Hematological: Negative.   Psychiatric/Behavioral: Negative.      Objective: Vital Signs: BP 115/74   Pulse 75   Ht 5\' 7"  (1.702 m)   Wt 148 lb (67.1 kg)   BMI 23.18 kg/m   Physical Exam  Constitutional: She is oriented to person, place, and time. She appears well-developed and well-nourished.  HENT:  Head: Normocephalic.  Eyes: EOM are normal. Pupils are equal, round, and reactive to light.  Neck: Normal range of motion. Neck supple.  Pulmonary/Chest: Effort normal and breath sounds normal.  Abdominal: Soft. Bowel sounds are normal.  Musculoskeletal: She exhibits tenderness.  Neurological: She is alert and oriented to person, place, and time.  Skin: Skin is warm and dry.  Psychiatric: She has a normal mood and affect. Her behavior is normal. Judgment and thought content normal.    Back Exam   Tenderness  The patient is experiencing tenderness in the lumbar and cervical.  Range of Motion  Extension: abnormal  Flexion: abnormal  Lateral Bend Right: abnormal  Lateral Bend Left: abnormal  Rotation Right: abnormal  Rotation Left: abnormal   Muscle Strength  Right Quadriceps:  5/5  Left Quadriceps:  5/5  Right Hamstrings:  5/5  Left Hamstrings:  5/5   Tests  Straight leg raise right: negative Straight leg raise left: negative  Reflexes  Patellar: normal Achilles: normal Biceps: normal Babinski's sign: normal   Other  Toe Walk: normal Heel Walk: normal Sensation: normal Gait: normal  Erythema: no back redness Scars: absent      Specialty Comments:  No specialty comments available.  Imaging: No results found.   PMFS  History: Patient Active Problem List   Diagnosis Date Noted  . Paresthesia of skin 01/01/2016  . Chlamydia infection 01/27/2015  . Smoking 12/26/2013  . Chronic lower back pain 04/13/2013  . Sciatica 04/13/2013  . Chronic neck pain 04/13/2013  . Spondylosis of cervical spine 04/13/2013   Past Medical History:  Diagnosis Date  . Acid reflux   . Diverticulitis   . Low back pain   . 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 Sciatica   . Seasonal allergies     Family History  Problem Relation Age of Onset  . Asthma Maternal Aunt   . Diabetes Maternal Aunt   . Cancer Maternal Aunt   . Hypertension Maternal Grandmother   . Colon cancer Neg Hx   . Pancreatic cancer Neg Hx   . Rectal cancer Neg Hx   . Stomach cancer Neg Hx     Past Surgical History:  Procedure Laterality Date  . ABDOMINAL HYSTERECTOMY    . ABDOMINAL SURGERY    . ANKLE SURGERY     lt.  . right breast cyst removed     . uretherl     uretrral diverticultis   Social History   Occupational History  . Not on file.   Social History Main Topics  . Smoking status: Current Every Day Smoker    Packs/day: 0.50    Years: 35.00  . Smokeless tobacco: Never Used  . Alcohol use No  . Drug use: No  . Sexual activity: No

## 2016-01-01 NOTE — Progress Notes (Signed)
Cheryl King - 50 y.o. female MRN [id number]  Date of birth: Sep 13, 1965  Office Visit Note: Visit Date: 01/01/2016 PCP: Cheryl Reamer, MD Referred by: Cheryl Reamer, MD  Subjective: Chief Complaint  Patient presents with  . Left Hand - Pain, Numbness  . Right Hand - Pain, Numbness   HPI: [name] is a 50 year old female who is right-hand dominant. She had a prior electrodiagnostic study in March 2016 that showed very mild slowing of the ulnar nerve distally at the wrist but otherwise was normal. That distal slowing was probably temperature artifact but did sort of fit with more numbness tingling in the right fourth and fifth digit. She continues to reportBilateral wrist pain. Pain on right from elbow to hand. Sometimes has numbness or tingling in hands. Second and fourth fingers on right are the worst. Comes and goes. Symptoms since at least 2009.     Bilateral Upper Extremity Right hand dominant No lotions ROS Otherwise per HPI.  Assessment & Plan: Visit Diagnoses:  1. Paresthesia of skin     Plan: Findings:  Impression: Essentially NORMAL electrodiagnostic study of both upper limbs.  There was once again very very mild distal slowing at the ulnar nerve at the wrist.This represents no essential change from the prior study in 2016  There is no significant electrodiagnostic evidence of nerve entrapment, brachial plexopathy, cervical radiculopathy or generalized peripheral neuropathy.  As you know, purely sensory or demyelinating radiculopathies and chemical radiculitis may not be detected with this particular electrodiagnostic study.  Recommendations: 1.  Follow-up with referring physician. 2.  Continue current management of symptoms.      Meds & Orders: No orders of the defined types were placed in this encounter.   Orders Placed This Encounter  Procedures  . NCV with EMG (electromyography)    Follow-up: No Follow-up on file.   Procedures: No  procedures performed  EMG & NCV Findings: Evaluation of the right ulnar sensory nerve showed decreased conduction velocity (Wrist-5th Digit, 37 m/s).  All remaining nerves (as indicated in the following tables) were within normal limits.  Left vs. Right side comparison data for the ulnar motor nerve indicates abnormal L-R amplitude difference (30.4 %).  All remaining left vs. right side differences were within normal limits.    All examined muscles (as indicated in the following table) showed no evidence of electrical instability.    Impression: Essentially NORMAL electrodiagnostic study of both upper limbs.  There was once again very very mild distal slowing at the ulnar nerve at the wrist.This represents no essential change from the prior study in 2016  There is no significant electrodiagnostic evidence of nerve entrapment, brachial plexopathy, cervical radiculopathy or generalized peripheral neuropathy.  As you know, purely sensory or demyelinating radiculopathies and chemical radiculitis may not be detected with this particular electrodiagnostic study.  Recommendations: 1.  Follow-up with referring physician. 2.  Continue current management of symptoms.  Nerve Conduction Studies Anti Sensory Summary Table   Stim Site NR Peak (ms) Norm Peak (ms) P-T Amp (V) Norm P-T Amp Site1 Site2 Delta-P (ms) Dist (cm) Vel (m/s) Norm Vel (m/s)  Left Median Acr Palm Anti Sensory (2nd Digit)  31.3C  Wrist    3.8 <4.0 62.7 >10 Wrist Palm 1.8 0.0    Palm    2.0 <2.0 5.3         Right Median Acr Palm Anti Sensory (2nd Digit)  30.8C  Wrist    3.8 <4.0 29.0 >10 Wrist Palm 1.8  0.0    Palm    2.0 <2.0 5.8         Left Radial Anti Sensory (Base 1st Digit)  32.8C  Wrist    2.2 <3.1 38.3  Wrist Base 1st Digit 2.2 0.0    Right Radial Anti Sensory (Base 1st Digit)  31.4C  Wrist    2.4 <3.1 17.5  Wrist Base 1st Digit 2.4 0.0    Left Ulnar Anti Sensory (5th Digit)  31.6C  Wrist    3.5 <3.8 34.4 >15.0 Wrist  5th Digit 3.5 14.0 40 >38  Right Ulnar Anti Sensory (5th Digit)  30.7C  Wrist    3.8 <3.8 15.0 >15.0 Wrist 5th Digit 3.8 14.0 *37 >38   Motor Summary Table   Stim Site NR Onset (ms) Norm Onset (ms) O-P Amp (mV) Norm O-P Amp Site1 Site2 Delta-0 (ms) Dist (cm) Vel (m/s) Norm Vel (m/s)  Left Median Motor (Abd Poll Brev)  32.5C  Wrist    3.8 <4.2 10.0 >5 Elbow Wrist 3.7 19.0 51 >50  Elbow    7.5  10.1         Right Median Motor (Abd Poll Brev)  31.2C  Wrist    3.8 <4.2 10.8 >5 Elbow Wrist 3.8 20.0 53 >50  Elbow    7.6  10.3         Left Ulnar Motor (Abd Dig Min)  32.2C  Wrist    2.7 <4.2 5.5 >3 B Elbow Wrist 3.2 19.0 59 >53  B Elbow    5.9  5.5  A Elbow B Elbow 1.1 9.5 86 >53  A Elbow    7.0  5.8         Right Ulnar Motor (Abd Dig Min)  30.9C  Wrist    3.0 <4.2 7.9 >3 B Elbow Wrist 3.3 18.5 56 >53  B Elbow    6.3  9.1  A Elbow B Elbow 1.1 10.0 91 >53  A Elbow    7.4  9.4          EMG   Side Muscle Nerve Root Ins Act Fibs Psw Amp Dur Poly Recrt Int Fraser Din Comment  Right Abd Poll Brev Median C8-T1 Nml Nml Nml Nml Nml 0 Nml Nml   Right 1stDorInt Ulnar C8-T1 Nml Nml Nml Nml Nml 0 Nml Nml   Right PronatorTeres Median C6-7 Nml Nml Nml Nml Nml 0 Nml Nml   Right Biceps Musculocut C5-6 Nml Nml Nml Nml Nml 0 Nml Nml   Right Deltoid Axillary C5-6 Nml Nml Nml Nml Nml 0 Nml Nml     Nerve Conduction Studies Anti Sensory Left/Right Comparison   Stim Site L Lat (ms) R Lat (ms) L-R Lat (ms) L Amp (V) R Amp (V) L-R Amp (%) Site1 Site2 L Vel (m/s) R Vel (m/s) L-R Vel (m/s)  Median Acr Palm Anti Sensory (2nd Digit)  31.3C  Wrist 3.8 3.8 0.0 62.7 29.0 53.7 Wrist Palm     Palm 2.0 2.0 0.0 5.3 5.8 8.6       Radial Anti Sensory (Base 1st Digit)  32.8C  Wrist 2.2 2.4 0.2 38.3 17.5 54.3 Wrist Base 1st Digit     Ulnar Anti Sensory (5th Digit)  31.6C  Wrist 3.5 3.8 0.3 34.4 15.0 56.4 Wrist 5th Digit 40 *37 3   Motor Left/Right Comparison   Stim Site L Lat (ms) R Lat (ms) L-R Lat (ms) L Amp (mV)  R Amp (mV) L-R Amp (%) Site1 Site2 L Vel (  m/s) R Vel (m/s) L-R Vel (m/s)  Median Motor (Abd Poll Brev)  32.5C  Wrist 3.8 3.8 0.0 10.0 10.8 7.4 Elbow Wrist 51 53 2  Elbow 7.5 7.6 0.1 10.1 10.3 1.9       Ulnar Motor (Abd Dig Min)  32.2C  Wrist 2.7 3.0 0.3 5.5 7.9 *30.4 B Elbow Wrist 59 56 3  B Elbow 5.9 6.3 0.4 5.5 9.1 39.6 A Elbow B Elbow 86 91 5  A Elbow 7.0 7.4 0.4 5.8 9.4 38.3             Clinical History: No specialty comments available.  She reports that she has been smoking.  She has a 17.50 pack-year smoking history. She has never used smokeless tobacco. No results for input(s): HGBA1C, LABURIC in the last 8760 hours.  Objective:  VS:  HT:    WT:   BMI:     BP:   HR: bpm  TEMP: ( )  RESP:  Physical Exam  Musculoskeletal:  Bilateral hand showed no intrinsic muscle wasting. Mild arthritic change. No cellulitis. No swelling or allodynia. She has good strength with finger abduction and APB and wrist extension.    Ortho Exam Imaging: No results found.  Past Medical/Family/Surgical/Social History: Medications & Allergies reviewed per EMR Patient Active Problem List   Diagnosis Date Noted  . Paresthesia of skin 01/01/2016  . Chlamydia infection 01/27/2015  . Smoking 12/26/2013  . Chronic lower back pain 04/13/2013  . Sciatica 04/13/2013  . Chronic neck pain 04/13/2013  . Spondylosis of cervical spine 04/13/2013   Past Medical History:  Diagnosis Date  . Acid reflux   . Diverticulitis   . Low back pain   . 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 Sciatica   . Seasonal allergies    Family History  Problem Relation Age of Onset  . Asthma Maternal Aunt   . Diabetes Maternal Aunt   . Cancer Maternal Aunt   . Hypertension Maternal Grandmother   . Colon cancer Neg Hx   . Pancreatic cancer Neg Hx   . Rectal cancer Neg Hx   . Stomach cancer Neg Hx    Past Surgical History:  Procedure Laterality Date  . ABDOMINAL HYSTERECTOMY    . ABDOMINAL SURGERY    . ANKLE  SURGERY     lt.  . right breast cyst removed     . uretherl     uretrral diverticultis   Social History   Occupational History  . Not on file.   Social History Main Topics  . Smoking status: Current Every Day Smoker    Packs/day: 0.50    Years: 35.00  . Smokeless tobacco: Never Used  . Alcohol use No  . Drug use: No  . Sexual activity: No

## 2016-01-01 NOTE — Patient Instructions (Addendum)
Hyperflexion of the elbow is usually associated with the symptoms of cubital tunnel syndome of the arm due to pressure on the the ulna nerve at the elbow when the elbow is flexed. Avoid activities that involve pulling with the arm or flexed position.  The neck condition is better if avoiding extension neck or upward glaze. Over head lifting and overhead use of the arms will tend to worsen the symptoms associated with neck arthritis.Occasionally biofreeeze is beneficial in decreasing the pain associated with Arthritis in the neck.  Ice at the end of the day and a morning hot shower is sometimes help in decreasing pain.

## 2016-01-01 NOTE — Procedures (Signed)
EMG & NCV Findings: Evaluation of the right ulnar sensory nerve showed decreased conduction velocity (Wrist-5th Digit, 37 m/s).  All remaining nerves (as indicated in the following tables) were within normal limits.  Left vs. Right side comparison data for the ulnar motor nerve indicates abnormal L-R amplitude difference (30.4 %).  All remaining left vs. right side differences were within normal limits.    All examined muscles (as indicated in the following table) showed no evidence of electrical instability.    Impression: Essentially NORMAL electrodiagnostic study of both upper limbs.  There was once again very very mild distal slowing at the ulnar nerve at the wrist.This represents no essential change from the prior study in 2016  There is no significant electrodiagnostic evidence of nerve entrapment, brachial plexopathy, cervical radiculopathy or generalized peripheral neuropathy.  As you know, purely sensory or demyelinating radiculopathies and chemical radiculitis may not be detected with this particular electrodiagnostic study.  Recommendations: 1.  Follow-up with referring physician. 2.  Continue current management of symptoms.  Nerve Conduction Studies Anti Sensory Summary Table   Stim Site NR Peak (ms) Norm Peak (ms) P-T Amp (V) Norm P-T Amp Site1 Site2 Delta-P (ms) Dist (cm) Vel (m/s) Norm Vel (m/s)  Left Median Acr Palm Anti Sensory (2nd Digit)  31.3C  Wrist    3.8 <4.0 62.7 >10 Wrist Palm 1.8 0.0    Palm    2.0 <2.0 5.3         Right Median Acr Palm Anti Sensory (2nd Digit)  30.8C  Wrist    3.8 <4.0 29.0 >10 Wrist Palm 1.8 0.0    Palm    2.0 <2.0 5.8         Left Radial Anti Sensory (Base 1st Digit)  32.8C  Wrist    2.2 <3.1 38.3  Wrist Base 1st Digit 2.2 0.0    Right Radial Anti Sensory (Base 1st Digit)  31.4C  Wrist    2.4 <3.1 17.5  Wrist Base 1st Digit 2.4 0.0    Left Ulnar Anti Sensory (5th Digit)  31.6C  Wrist    3.5 <3.8 34.4 >15.0 Wrist 5th Digit 3.5 14.0 40  >38  Right Ulnar Anti Sensory (5th Digit)  30.7C  Wrist    3.8 <3.8 15.0 >15.0 Wrist 5th Digit 3.8 14.0 *37 >38   Motor Summary Table   Stim Site NR Onset (ms) Norm Onset (ms) O-P Amp (mV) Norm O-P Amp Site1 Site2 Delta-0 (ms) Dist (cm) Vel (m/s) Norm Vel (m/s)  Left Median Motor (Abd Poll Brev)  32.5C  Wrist    3.8 <4.2 10.0 >5 Elbow Wrist 3.7 19.0 51 >50  Elbow    7.5  10.1         Right Median Motor (Abd Poll Brev)  31.2C  Wrist    3.8 <4.2 10.8 >5 Elbow Wrist 3.8 20.0 53 >50  Elbow    7.6  10.3         Left Ulnar Motor (Abd Dig Min)  32.2C  Wrist    2.7 <4.2 5.5 >3 B Elbow Wrist 3.2 19.0 59 >53  B Elbow    5.9  5.5  A Elbow B Elbow 1.1 9.5 86 >53  A Elbow    7.0  5.8         Right Ulnar Motor (Abd Dig Min)  30.9C  Wrist    3.0 <4.2 7.9 >3 B Elbow Wrist 3.3 18.5 56 >53  B Elbow    6.3  9.1  A Elbow B Elbow 1.1 10.0 91 >53  A Elbow    7.4  9.4          EMG   Side Muscle Nerve Root Ins Act Fibs Psw Amp Dur Poly Recrt Int Fraser Din Comment  Right Abd Poll Brev Median C8-T1 Nml Nml Nml Nml Nml 0 Nml Nml   Right 1stDorInt Ulnar C8-T1 Nml Nml Nml Nml Nml 0 Nml Nml   Right PronatorTeres Median C6-7 Nml Nml Nml Nml Nml 0 Nml Nml   Right Biceps Musculocut C5-6 Nml Nml Nml Nml Nml 0 Nml Nml   Right Deltoid Axillary C5-6 Nml Nml Nml Nml Nml 0 Nml Nml     Nerve Conduction Studies Anti Sensory Left/Right Comparison   Stim Site L Lat (ms) R Lat (ms) L-R Lat (ms) L Amp (V) R Amp (V) L-R Amp (%) Site1 Site2 L Vel (m/s) R Vel (m/s) L-R Vel (m/s)  Median Acr Palm Anti Sensory (2nd Digit)  31.3C  Wrist 3.8 3.8 0.0 62.7 29.0 53.7 Wrist Palm     Palm 2.0 2.0 0.0 5.3 5.8 8.6       Radial Anti Sensory (Base 1st Digit)  32.8C  Wrist 2.2 2.4 0.2 38.3 17.5 54.3 Wrist Base 1st Digit     Ulnar Anti Sensory (5th Digit)  31.6C  Wrist 3.5 3.8 0.3 34.4 15.0 56.4 Wrist 5th Digit 40 *37 3   Motor Left/Right Comparison   Stim Site L Lat (ms) R Lat (ms) L-R Lat (ms) L Amp (mV) R Amp (mV) L-R Amp (%)  Site1 Site2 L Vel (m/s) R Vel (m/s) L-R Vel (m/s)  Median Motor (Abd Poll Brev)  32.5C  Wrist 3.8 3.8 0.0 10.0 10.8 7.4 Elbow Wrist 51 53 2  Elbow 7.5 7.6 0.1 10.1 10.3 1.9       Ulnar Motor (Abd Dig Min)  32.2C  Wrist 2.7 3.0 0.3 5.5 7.9 *30.4 B Elbow Wrist 59 56 3  B Elbow 5.9 6.3 0.4 5.5 9.1 39.6 A Elbow B Elbow 86 91 5  A Elbow 7.0 7.4 0.4 5.8 9.4 38.3

## 2016-01-02 ENCOUNTER — Other Ambulatory Visit (INDEPENDENT_AMBULATORY_CARE_PROVIDER_SITE_OTHER): Payer: Self-pay | Admitting: Specialist

## 2016-01-02 DIAGNOSIS — G5623 Lesion of ulnar nerve, bilateral upper limbs: Secondary | ICD-10-CM

## 2016-01-02 NOTE — Progress Notes (Signed)
Will request neurology eval for numbness as EMG/NCV do not suggest enough abnormality to warrant surgery, yet she  Is having significant symptoms.

## 2016-01-02 NOTE — Progress Notes (Signed)
I'm going to send her to neurology, I just don't think surgery is indicated and yet she is complaining of unrelenting numbness and neurologic symptoms. jen

## 2016-01-05 ENCOUNTER — Telehealth (INDEPENDENT_AMBULATORY_CARE_PROVIDER_SITE_OTHER): Payer: Self-pay | Admitting: *Deleted

## 2016-01-05 NOTE — Telephone Encounter (Signed)
Pt called about a medication she has that has drug interactions. Pt requesting call back [phone]

## 2016-01-06 ENCOUNTER — Telehealth (INDEPENDENT_AMBULATORY_CARE_PROVIDER_SITE_OTHER): Payer: Self-pay | Admitting: Specialist

## 2016-01-06 NOTE — Telephone Encounter (Signed)
IC number below, this is a wrong number.  IC [phone] and LMVM advising this and to call me back, to ask for [facility].

## 2016-01-06 NOTE — Telephone Encounter (Signed)
IC pt and LMVM at this number to call us back to discuss.

## 2016-01-06 NOTE — Telephone Encounter (Signed)
Patient called wanting to leave another number to call her back. The number is [phone].

## 2016-01-07 NOTE — Telephone Encounter (Signed)
I called the new number provided and someone answered and advised that I had the wrong number. Will wait for patient to return call.

## 2016-01-07 NOTE — Telephone Encounter (Signed)
Nitka pt, see notes.

## 2016-01-07 NOTE — Telephone Encounter (Signed)
Pt called back, I  Advised pt you would call her back

## 2016-01-07 NOTE — Telephone Encounter (Signed)
See note, sent you the other msg

## 2016-01-14 MED FILL — ?OMEPRAZOLE DR 20 MG CAPSUL: 20 | 30 days supply | Qty: 30 | Fill #3

## 2016-01-14 MED FILL — DICLOFENAC SOD EC 50 MG TAB: 50 | 30 days supply | Qty: 90 | Fill #2

## 2016-01-14 MED FILL — ATORVASTATIN 20 MG TABLET: 20 | 30 days supply | Qty: 30 | Fill #2

## 2016-01-14 MED FILL — ?CYCLOBENZAPRINE 10 MG TABL: 10 | 18 days supply | Qty: 55 | Fill #1

## 2016-01-28 ENCOUNTER — Ambulatory Visit: Payer: No Typology Code available for payment source | Attending: Internal Medicine | Admitting: Internal Medicine

## 2016-01-28 ENCOUNTER — Encounter: Payer: Self-pay | Admitting: Internal Medicine

## 2016-01-28 VITALS — BP 114/78 | HR 62 | Temp 98.8°F | Resp 16 | Wt 163.0 lb

## 2016-01-28 DIAGNOSIS — F17200 Nicotine dependence, unspecified, uncomplicated: Secondary | ICD-10-CM | POA: Insufficient documentation

## 2016-01-28 DIAGNOSIS — M545 Low back pain: Secondary | ICD-10-CM | POA: Insufficient documentation

## 2016-01-28 DIAGNOSIS — R21 Rash and other nonspecific skin eruption: Secondary | ICD-10-CM | POA: Insufficient documentation

## 2016-01-28 DIAGNOSIS — Z720 Tobacco use: Secondary | ICD-10-CM

## 2016-01-28 DIAGNOSIS — G894 Chronic pain syndrome: Secondary | ICD-10-CM | POA: Insufficient documentation

## 2016-01-28 DIAGNOSIS — K219 Gastro-esophageal reflux disease without esophagitis: Secondary | ICD-10-CM

## 2016-01-28 DIAGNOSIS — R1031 Right lower quadrant pain: Secondary | ICD-10-CM

## 2016-01-28 LAB — POCT URINALYSIS DIPSTICK
Glucose, UA: NEGATIVE
KETONES UA: NEGATIVE
Leukocytes, UA: NEGATIVE
Nitrite, UA: NEGATIVE
PROTEIN UA: NEGATIVE
RBC UA: NEGATIVE
SPEC GRAV UA: 1.02
UROBILINOGEN UA: 0.2
pH, UA: 7

## 2016-01-28 MED ORDER — RANITIDINE HCL 150 MG PO TABS: 150.0000 mg | ORAL_TABLET | Freq: Two times a day (BID) | ORAL | 3 refills | Status: DC

## 2016-01-28 MED ORDER — TRIAMCINOLONE ACETONIDE 0.5 % EX OINT: 1.0000 "application " | TOPICAL_OINTMENT | Freq: Two times a day (BID) | CUTANEOUS | 0 refills | Status: DC

## 2016-01-28 MED FILL — raNITIdine HCL 150 MG TABS: 150 | 30 days supply | Qty: 60 | Fill #0

## 2016-01-28 MED FILL — TRIAMCINOLONE 0.5% OINTMENT: 0.5 | 15 days supply | Qty: 30 | Fill #0

## 2016-01-28 MED FILL — AMITRIPTYLINE HCL 25 MG TAB: 25 | 30 days supply | Qty: 60 | Fill #2

## 2016-01-28 NOTE — Progress Notes (Signed)
Cheryl King, is a 51 y.o. female  [id number]  [id number]  DOB - [birth year]  Chief Complaint  Patient presents with  . Abdominal Pain  . Rash        Subjective:   Cheryl King is a 51 y.o. female here today for a follow up visit, w/ hx of gerd/chronic pain syndrome, last seen in clinic 8/17.  She states she tried to wean of on otc ppi 20 mg qd, but her heartburn got worse so she is back on it.  Has intermittent rlq abd pain, low pelvic region, last time about 2-3 days ago, none now. nml bms, no n/v/d/f/c associated w/ it. Prior partial hsyterectomy.  Pt also co of dry rash right upper back, on going for years, she has tried coconut oil w/o relief.  Denies burning/pruritic pain, no prior hx of shingles.  Patient has No headache, No chest pain, No abdominal pain currently- No Nausea, No new weakness tingling or numbness, No Cough - SOB. Denies vag discharge/dysuria.  No problems updated.  ALLERGIES: Allergies  Allergen Reactions  . Percocet [Oxycodone-Acetaminophen] Nausea Only  . Vicodin [Hydrocodone-Acetaminophen] Nausea Only  . Penicillins Rash    PAST MEDICAL HISTORY: Past Medical History:  Diagnosis Date  . Acid reflux   . Diverticulitis   . Low back pain   . 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 Sciatica   . Seasonal allergies     MEDICATIONS AT HOME: Prior to Admission medications   Medication Sig Start Date End Date Taking? Authorizing Provider  amitriptyline (ELAVIL) 25 MG tablet Take 1 tablet (25 mg total) by mouth 2 (two) times daily. 11/24/15   Jessy Oto, MD  atorvastatin (LIPITOR) 20 MG tablet TAKE 1 TABLET BY MOUTH DAILY 11/10/15   Maren Reamer, MD  cyclobenzaprine (FLEXERIL) 10 MG tablet TAKE 1 TABLET BY MOUTH 3 TIMES DAILY AS NEEDED FOR MUSCLE SPASMS. 12/17/15   Maren Reamer, MD  diclofenac (VOLTAREN) 50 MG EC tablet Take 50 mg by mouth 2 (two) times daily.    Historical Provider, MD  diclofenac sodium (VOLTAREN) 1 % GEL  Apply 2 g topically 4 (four) times daily.    Historical Provider, MD  gabapentin (NEURONTIN) 100 MG capsule Take one tablet po qhs for 5 days then one tablet po BID for 5 days then one tablet po TID. 01/01/16   Jessy Oto, MD  Lactobacillus CAPS Take 1 capsule by mouth daily.    Historical Provider, MD  Multiple Vitamin (MULTIVITAMIN WITH MINERALS) TABS tablet Take 1 tablet by mouth daily.    Historical Provider, MD  ondansetron (ZOFRAN) 8 MG tablet Take 1 tablet (8 mg total) by mouth every 8 (eight) hours as needed for nausea or vomiting. 01/30/14   Renfrow, PA-C  pregabalin (LYRICA) 50 MG capsule Take 1 capsule (50 mg total) by mouth at bedtime. Patient not taking: Reported on 01/28/2016 08/22/15   Maren Reamer, MD  ranitidine (ZANTAC) 150 MG tablet Take 1 tablet (150 mg total) by mouth 2 (two) times daily. 01/28/16   Maren Reamer, MD  traMADol (ULTRAM) 50 MG tablet Take 50 mg by mouth every 6 (six) hours as needed.    Historical Provider, MD  triamcinolone ointment (KENALOG) 0.5 % Apply 1 application topically 2 (two) times daily. 01/28/16   Maren Reamer, MD     Objective:   Vitals:   01/28/16 0945  BP: 114/78  Pulse: 62  Resp: 16  Temp: 98.8 F (  37.1 C)  TempSrc: Oral  SpO2: 97%  Weight: 163 lb (73.9 kg)    Exam General appearance : Awake, alert, not in any distress. Speech Clear. Not toxic looking, pleasant.   HEENT: Atraumatic and Normocephalic, pupils equally reactive to light. Neck: supple,   Chest:Good air entry bilaterally, no added sounds. CVS: S1 S2 regular, no murmurs/gallups or rubs. Abdomen: Bowel sounds active, No ttp on deep palpation throughout abd, no g/r, no cva tenderness bilat. Extremities: B/L Lower Ext shows no edema, both legs are warm to touch Neurology: Awake alert, and oriented X 3, CN II-XII grossly intact, Non focal Skin: dry dark pigmentated skin on right scapular region, no pustules noted , no changes on sensation/no  tingling/numbness on palpation.  Data Review No results found for: HGBA1C  Depression screen [facility] 2/9 01/28/2016 08/22/2015 05/13/2015 01/23/2015 07/25/2014  Decreased Interest 3 1 1 3  0  Down, Depressed, Hopeless 1 1 1 3  0  PHQ - 2 Score 4 2 2 6  0  Altered sleeping 3 3 0 2 -  Tired, decreased energy 1 1 0 2 -  Change in appetite 0 0 0 3 -  Feeling bad or failure about yourself  1 1 1 3  -  Trouble concentrating 0 1 0 2 -  Moving slowly or fidgety/restless 0 0 0 2 -  Suicidal thoughts 1 1 1 1  -  PHQ-9 Score 10 9 4 21  -      Assessment & Plan   1. Right lower quadrant abdominal pain - ?related to ovulation, pt denies constipation. Doubt gb/appendix related given nml exam. - recd recording when she has pain on calender to see if occurs recurrently at particular time, may need vag [place] to eval ovarian cyst if persist/recurrs - Urinalysis Dipstick  - r/o uti   2. Rash and nonspecific skin eruption Suspect related to dryness, doubt shingles. - rcd petroleum jelly emoliant - kenalog cream prn, recd use sparingly since can cause lightening of skin.  3. Gastroesophageal reflux disease without esophagitis Improving again since she resumed ppi, recd trying ranitadine 150bid, given long term use of ppi shows risk for ckd. - pt understands, will start ranitadine while on ppi, titrate ppi to off after 1-2 wk of zantac, tums prn - gerd diet again urged, pt eats tomatoes/ketchup/caffeine/cold water/smokes/chocholate - all which can irritate her gerd more - info provided again  4. Tobacco abuse Total cessation again encouraged.     Patient have been counseled extensively about nutrition and exercise  Return in about 3 months (around 04/27/2016), or if symptoms worsen or fail to improve.  The patient was given clear instructions to go to ER or return to medical center if symptoms don't improve, worsen or new problems develop. The patient verbalized understanding. The patient was told to call to  get lab results if they haven't heard anything in the next week.   This note has been created with Surveyor, quantity. Any transcriptional errors are unintentional.   Maren Reamer, MD, [place] [facility] [place], [place]   01/28/2016, 10:06 AM

## 2016-01-28 NOTE — Patient Instructions (Addendum)
Food Choices for Gastroesophageal Reflux Disease, Adult When you have gastroesophageal reflux disease (GERD), the foods you eat and your eating habits are very important. Choosing the right foods can help ease your discomfort. What guidelines do I need to follow?  Choose fruits, vegetables, whole grains, and low-fat dairy products.  Choose low-fat meat, fish, and poultry.  Limit fats such as oils, salad dressings, butter, nuts, and avocado.  Keep a food diary. This helps you identify foods that cause symptoms.  Avoid foods that cause symptoms. These may be different for everyone.  Eat small meals often instead of 3 large meals a day.  Eat your meals slowly, in a place where you are relaxed.  Limit fried foods.  Cook foods using methods other than frying.  Avoid drinking alcohol.  Avoid drinking large amounts of liquids with your meals.  Avoid bending over or lying down until 2-3 hours after eating. What foods are not recommended? These are some foods and drinks that may make your symptoms worse: Vegetables  Tomatoes. Tomato juice. Tomato and spaghetti sauce. Chili peppers. Onion and garlic. Horseradish. Fruits  Oranges, grapefruit, and lemon (fruit and juice). Meats  High-fat meats, fish, and poultry. This includes hot dogs, ribs, ham, sausage, salami, and bacon. Dairy  Whole milk and chocolate milk. Sour cream. Cream. Butter. Ice cream. Cream cheese. Drinks  Coffee and tea. Bubbly (carbonated) drinks or energy drinks. Condiments  Hot sauce. Barbecue sauce. Sweets/Desserts  Chocolate and cocoa. Donuts. Peppermint and spearmint. Fats and Oils  High-fat foods. This includes French fries and potato chips. Other  Vinegar. Strong spices. This includes black pepper, white pepper, red pepper, cayenne, curry powder, cloves, ginger, and chili powder. The items listed above may not be a complete list of foods and drinks to avoid. Contact your dietitian for more information.    This information is not intended to replace advice given to you by your health care provider. Make sure you discuss any questions you have with your health care provider. Document Released: 07/06/2011 Document Revised: 06/12/2015 Document Reviewed: 11/08/2012 Elsevier Interactive Patient Education  2017 Elsevier Inc.    Steps to Quit Smoking Smoking tobacco can be bad for your health. It can also affect almost every organ in your body. Smoking puts you and people around you at risk for many serious long-lasting (chronic) diseases. Quitting smoking is hard, but it is one of the best things that you can do for your health. It is never too late to quit. What are the benefits of quitting smoking? When you quit smoking, you lower your risk for getting serious diseases and conditions. They can include:  Lung cancer or lung disease.  Heart disease.  Stroke.  Heart attack.  Not being able to have children (infertility).  Weak bones (osteoporosis) and broken bones (fractures). If you have coughing, wheezing, and shortness of breath, those symptoms may get better when you quit. You may also get sick less often. If you are pregnant, quitting smoking can help to lower your chances of having a baby of low birth weight. What can I do to help me quit smoking? Talk with your doctor about what can help you quit smoking. Some things you can do (strategies) include:  Quitting smoking totally, instead of slowly cutting back how much you smoke over a period of time.  Going to in-person counseling. You are more likely to quit if you go to many counseling sessions.  Using resources and support systems, such as:  Online chats with   a counselor.  Phone quitlines.  Printed self-help materials.  Support groups or group counseling.  Text messaging programs.  Mobile phone apps or applications.  Taking medicines. Some of these medicines may have nicotine in them. If you are pregnant or breastfeeding,  do not take any medicines to quit smoking unless your doctor says it is okay. Talk with your doctor about counseling or other things that can help you. Talk with your doctor about using more than one strategy at the same time, such as taking medicines while you are also going to in-person counseling. This can help make quitting easier. What things can I do to make it easier to quit? Quitting smoking might feel very hard at first, but there is a lot that you can do to make it easier. Take these steps:  Talk to your family and friends. Ask them to support and encourage you.  Call phone quitlines, reach out to support groups, or work with a counselor.  Ask people who smoke to not smoke around you.  Avoid places that make you want (trigger) to smoke, such as:  Bars.  Parties.  Smoke-break areas at work.  Spend time with people who do not smoke.  Lower the stress in your life. Stress can make you want to smoke. Try these things to help your stress:  Getting regular exercise.  Deep-breathing exercises.  Yoga.  Meditating.  Doing a body scan. To do this, close your eyes, focus on one area of your body at a time from head to toe, and notice which parts of your body are tense. Try to relax the muscles in those areas.  Download or buy apps on your mobile phone or tablet that can help you stick to your quit plan. There are many free apps, such as QuitGuide from the CDC (Centers for Disease Control and Prevention). You can find more support from smokefree.gov and other websites. This information is not intended to replace advice given to you by your health care provider. Make sure you discuss any questions you have with your health care provider. Document Released: 10/31/2008 Document Revised: 09/02/2015 Document Reviewed: 05/21/2014 Elsevier Interactive Patient Education  2017 Elsevier Inc.  

## 2016-02-02 MED FILL — ?CYCLOBENZAPRINE 10 MG TABL: 10 | 18 days supply | Qty: 55 | Fill #2

## 2016-02-03 ENCOUNTER — Other Ambulatory Visit: Payer: Self-pay | Admitting: *Deleted

## 2016-02-09 MED ORDER — PREGABALIN 50 MG PO CAPS: 50.0000 mg | ORAL_CAPSULE | Freq: Every day | ORAL | 3 refills | Status: DC

## 2016-02-09 MED ORDER — VARENICLINE TARTRATE 1 MG PO TABS: 1.0000 mg | ORAL_TABLET | Freq: Two times a day (BID) | ORAL | 0 refills | Status: DC

## 2016-02-09 MED ORDER — VARENICLINE TARTRATE 0.5 MG X 11 & 1 MG X 42 PO MISC: ORAL | 0 refills | Status: DC

## 2016-02-09 NOTE — Telephone Encounter (Signed)
PRINTED FOR PASS PROGRAM 

## 2016-02-16 MED FILL — ATORVASTATIN 20 MG TABLET: 20 | 30 days supply | Qty: 30 | Fill #3

## 2016-02-16 MED FILL — GABAPENTIN 100 MG CAPSULE: 100 | 30 days supply | Qty: 90 | Fill #1

## 2016-02-16 MED FILL — DICLOFENAC SOD EC 50 MG TAB: 50 | 30 days supply | Qty: 90 | Fill #3

## 2016-02-18 ENCOUNTER — Ambulatory Visit: Payer: Self-pay | Attending: Internal Medicine

## 2016-02-27 MED FILL — raNITIdine HCL 150 MG TABS: 150 | 30 days supply | Qty: 60 | Fill #1

## 2016-02-27 MED FILL — ?CYCLOBENZAPRINE 10 MG TABL: 10 | 18 days supply | Qty: 55 | Fill #3

## 2016-02-27 MED FILL — AMITRIPTYLINE HCL 25 MG TAB: 25 | 30 days supply | Qty: 30 | Fill #2

## 2016-03-01 ENCOUNTER — Encounter: Payer: Self-pay | Admitting: Neurology

## 2016-03-01 ENCOUNTER — Ambulatory Visit (INDEPENDENT_AMBULATORY_CARE_PROVIDER_SITE_OTHER): Payer: Self-pay | Admitting: Neurology

## 2016-03-01 ENCOUNTER — Telehealth (INDEPENDENT_AMBULATORY_CARE_PROVIDER_SITE_OTHER): Payer: Self-pay | Admitting: Specialist

## 2016-03-01 DIAGNOSIS — M47812 Spondylosis without myelopathy or radiculopathy, cervical region: Secondary | ICD-10-CM

## 2016-03-01 DIAGNOSIS — R200 Anesthesia of skin: Secondary | ICD-10-CM

## 2016-03-01 DIAGNOSIS — G5621 Lesion of ulnar nerve, right upper limb: Secondary | ICD-10-CM

## 2016-03-01 DIAGNOSIS — R202 Paresthesia of skin: Secondary | ICD-10-CM

## 2016-03-01 MED ORDER — GABAPENTIN 300 MG PO CAPS: 300.0000 mg | ORAL_CAPSULE | Freq: Three times a day (TID) | ORAL | 11 refills | Status: DC

## 2016-03-01 NOTE — Progress Notes (Signed)
PATIENT: Cheryl King DOB: 02/11/1965  Chief Complaint  Patient presents with  . Carpal Tunnel    She is here to have her bilateral carpal tunnel further evaluated.  Symptoms present since 2009.  Right hand worse than left.  Marland Kitchen PCP    Maren Reamer, MD  . Orthopedics    Jessy Oto, MD - referring MD     HISTORICAL  Cheryl King is a 51 years old right-handed female, seen in refer by orthopedic surgeon Jessy Oto, for evaluation of the pain from right elbow radiating down to right hand, her primary care physician is Dr.Dawn Lazarus Gowda, initial evaluation was on February twelfth 2018.  She had a past medical history of chronic migraine taking amitriptyline 25 mg 2 tablets every night, hyperlipidemia,   In 2009, without clear triggers, she began to notice right arm pain, starting from right elbow, radiating to the right ulnar forearm, involving right fourth and fifth fingers, numbness tingling, which has been persistent since then, she also complains of chronic neck, low back pain, is applying for social disability.  She was evaluated by orthopedic surgeon Dr. Louanne Skye recently, she was given Lyrica 50 mg every night was helpful, but she could not afford Lyrica, she is now taking gabapentin 100 mg 3 times a day with limited help. She denies significant right hand weakness, mild neck pain, no left arm involvement.  I have personally reviewed MRI of right elbow in October 2016 that was normal.  MRI cervical spine in April 2015, there was degenerative spondylosis from C3-4 through C6-7, most pronounced at C4-5, there is neuroforaminal stenosis bilaterally, this finding has worsened slightly since 2009, MRI of lumbar in 2015, show advanced disc degeneration with loss of height, and endplate osteophytes and bulging of the disc,  She also reported EMGs nerve conduction study recently by orthopedic clinic there was no significant abnormality found.  REVIEW OF SYSTEMS: Full  14 system review of systems performed and notable only for weight gain, feeling hot, feeling cold, increased thirst, numbness, depression anxiety, not enough sleep, disinterested in activities.  ALLERGIES: Allergies  Allergen Reactions  . Percocet [Oxycodone-Acetaminophen] Nausea Only  . Vicodin [Hydrocodone-Acetaminophen] Nausea Only  . Penicillins Rash    HOME MEDICATIONS: Current Outpatient Prescriptions  Medication Sig Dispense Refill  . amitriptyline (ELAVIL) 25 MG tablet Take 1 tablet (25 mg total) by mouth 2 (two) times daily. 60 tablet 2  . atorvastatin (LIPITOR) 20 MG tablet TAKE 1 TABLET BY MOUTH DAILY 30 tablet 3  . cyclobenzaprine (FLEXERIL) 10 MG tablet TAKE 1 TABLET BY MOUTH 3 TIMES DAILY AS NEEDED FOR MUSCLE SPASMS. 55 tablet 3  . diclofenac (VOLTAREN) 50 MG EC tablet Take 50 mg by mouth 2 (two) times daily.    . diclofenac sodium (VOLTAREN) 1 % GEL Apply 2 g topically 4 (four) times daily.    Marland Kitchen gabapentin (NEURONTIN) 100 MG capsule Take one tablet po qhs for 5 days then one tablet po BID for 5 days then one tablet po TID. 90 capsule 6  . Lactobacillus CAPS Take 1 capsule by mouth daily.    . Multiple Vitamin (MULTIVITAMIN WITH MINERALS) TABS tablet Take 1 tablet by mouth daily.    . ondansetron (ZOFRAN) 8 MG tablet Take 1 tablet (8 mg total) by mouth every 8 (eight) hours as needed for nausea or vomiting. 10 tablet 0  . pregabalin (LYRICA) 50 MG capsule Take 1 capsule (50 mg total) by mouth at bedtime. 30 capsule  3  . ranitidine (ZANTAC) 150 MG tablet Take 1 tablet (150 mg total) by mouth 2 (two) times daily. 60 tablet 3  . traMADol (ULTRAM) 50 MG tablet Take 50 mg by mouth every 6 (six) hours as needed.    . triamcinolone ointment (KENALOG) 0.5 % Apply 1 application topically 2 (two) times daily. 30 g 0  . varenicline (CHANTIX CONTINUING MONTH PAK) 1 MG tablet Take 1 tablet (1 mg total) by mouth 2 (two) times daily. 56 tablet 0  . varenicline (CHANTIX STARTING MONTH PAK)  0.5 MG X 11 & 1 MG X 42 tablet Use as directed 53 tablet 0   No current facility-administered medications for this visit.     PAST MEDICAL HISTORY: Past Medical History:  Diagnosis Date  . Acid reflux   . Carpal tunnel syndrome   . Diverticulitis   . Low back pain   . 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 Sciatica   . Seasonal allergies     PAST SURGICAL HISTORY: Past Surgical History:  Procedure Laterality Date  . ABDOMINAL HYSTERECTOMY    . ABDOMINAL SURGERY    . ANKLE SURGERY     lt.  . right breast cyst removed     . uretherl     uretrral diverticultis    FAMILY HISTORY: Family History  Problem Relation Age of Onset  . Healthy Mother   . Other Father     Unsure of medical history  . Asthma Maternal Aunt   . Diabetes Maternal Aunt   . Cancer Maternal Aunt   . Hypertension Maternal Grandmother   . Colon cancer Neg Hx   . Pancreatic cancer Neg Hx   . Rectal cancer Neg Hx   . Stomach cancer Neg Hx     SOCIAL HISTORY:  Social History   Social History  . Marital status: Single    Spouse name: N/A  . Number of children: 0  . Years of education: Bachelors   Occupational History  . Unemployed    Social History Main Topics  . Smoking status: Current Every Day Smoker    Packs/day: 0.50    Years: 35.00  . Smokeless tobacco: Never Used  . Alcohol use Yes     Comment: Social only - seldom  . Drug use: No  . Sexual activity: No   Other Topics Concern  . Not on file   Social History Narrative   Lives at home alone.   Right-handed.   Occasional caffeine use.     PHYSICAL EXAM   Vitals:   03/01/16 1017  BP: 112/73  Pulse: 79  Weight: 160 lb (72.6 kg)  Height: 5\' 7"  (1.702 m)    Not recorded      Body mass index is 25.06 kg/m.  PHYSICAL EXAMNIATION:  Gen: NAD, conversant, well nourised, obese, well groomed                     Cardiovascular: Regular rate rhythm, no peripheral edema, warm, nontender. Eyes: Conjunctivae clear without  exudates or hemorrhage Neck: Supple, no carotid bruits. Pulmonary: Clear to auscultation bilaterally   NEUROLOGICAL EXAM:  MENTAL STATUS: Speech:    Speech is normal; fluent and spontaneous with normal comprehension.  Cognition:     Orientation to time, place and person     Normal recent and remote memory     Normal Attention span and concentration     Normal Language, naming, repeating,spontaneous speech     Fund of knowledge  CRANIAL NERVES: CN II: Visual fields are full to confrontation. Fundoscopic exam is normal with sharp discs and no vascular changes. Pupils are round equal and briskly reactive to light. CN III, IV, VI: extraocular movement are normal. No ptosis. CN V: Facial sensation is intact to pinprick in all 3 divisions bilaterally. Corneal responses are intact.  CN VII: Face is symmetric with normal eye closure and smile. CN VIII: Hearing is normal to rubbing fingers CN IX, X: Palate elevates symmetrically. Phonation is normal. CN XI: Head turning and shoulder shrug are intact CN XII: Tongue is midline with normal movements and no atrophy.  MOTOR: There is no pronator drift of out-stretched arms. Muscle bulk and tone are normal. Muscle strength is normal.  REFLEXES: Reflexes are 2+ and symmetric at the biceps, triceps, knees, and ankles. Plantar responses are flexor.  SENSORY: Intact to light touch, pinprick, positional sensation and vibratory sensation are intact in fingers and toes.  COORDINATION: Rapid alternating movements and fine finger movements are intact. There is no dysmetria on finger-to-nose and heel-knee-shin.    GAIT/STANCE: Posture is normal. Gait is steady with normal steps, base, arm swing, and turning. Heel and toe walking are normal. Tandem gait is normal.  Romberg is absent.   DIAGNOSTIC DATA (LABS, IMAGING, TESTING) - I reviewed patient records, labs, notes, testing and imaging myself where available.   ASSESSMENT AND PLAN  Cheryl King is a 51 y.o. female   Right elbow pain, radiating paresthesia to right fourth and fifth fingers,  Most suggestive of right ulnar neuropathy  I have increased her gabapentin to 300 mg 3 times a day  I also advised her to use ACE bandage to her elbow.  Marcial Pacas, M.D. Ph.D.  [facility] [address], [place],  [address] Ph: [phone] Fax: [phone]  CC: Jessy Oto, MD,Dawn Lazarus Gowda, MD

## 2016-03-02 NOTE — Telephone Encounter (Signed)
ERROR

## 2016-03-08 MED FILL — $CHANTIX STARTING MONTH BOX: 0.5 MG X 11 | 30 days supply | Qty: 53 | Fill #0

## 2016-03-12 MED FILL — GABAPENTIN 100 MG CAPSULE: 100 | 30 days supply | Qty: 90 | Fill #2

## 2016-03-12 MED FILL — AMITRIPTYLINE HCL 25 MG TAB: 25 | 30 days supply | Qty: 30 | Fill #3

## 2016-03-16 MED FILL — GABAPENTIN 300 MG CAPSULE: 300 | 25 days supply | Qty: 90 | Fill #0

## 2016-03-17 ENCOUNTER — Other Ambulatory Visit: Payer: Self-pay | Admitting: Internal Medicine

## 2016-03-18 MED FILL — ATORVASTATIN 20 MG TABLET: 20 | 30 days supply | Qty: 30 | Fill #0

## 2016-03-24 ENCOUNTER — Other Ambulatory Visit: Payer: Self-pay | Admitting: Internal Medicine

## 2016-03-24 DIAGNOSIS — Z1231 Encounter for screening mammogram for malignant neoplasm of breast: Secondary | ICD-10-CM

## 2016-03-30 ENCOUNTER — Other Ambulatory Visit (INDEPENDENT_AMBULATORY_CARE_PROVIDER_SITE_OTHER): Payer: Self-pay | Admitting: Specialist

## 2016-03-30 ENCOUNTER — Other Ambulatory Visit: Payer: Self-pay | Admitting: Internal Medicine

## 2016-03-30 MED FILL — ?CYCLOBENZAPRINE 10 MG TABL: 10 | 18 days supply | Qty: 55 | Fill #0

## 2016-03-30 MED FILL — ?RANITIDINE 150 MG TABLET: 150 MG | 30 days supply | Qty: 60 | Fill #2

## 2016-03-30 NOTE — Telephone Encounter (Signed)
Diclofenac Refill Request    

## 2016-04-01 MED FILL — DICLOFENAC SOD EC 50 MG TAB: 50 | 30 days supply | Qty: 90 | Fill #0

## 2016-04-07 MED FILL — AMITRIPTYLINE HCL 25 MG TAB: 25 | 30 days supply | Qty: 30 | Fill #4

## 2016-04-13 ENCOUNTER — Ambulatory Visit
Admission: RE | Admit: 2016-04-13 | Discharge: 2016-04-13 | Disposition: A | Discharge status: Home / Self Care | Payer: Self-pay | Source: Ambulatory Visit | Attending: Internal Medicine | Admitting: Internal Medicine

## 2016-04-13 DIAGNOSIS — Z1231 Encounter for screening mammogram for malignant neoplasm of breast: Secondary | ICD-10-CM

## 2016-04-14 ENCOUNTER — Other Ambulatory Visit: Payer: Self-pay | Admitting: Internal Medicine

## 2016-04-14 DIAGNOSIS — R928 Other abnormal and inconclusive findings on diagnostic imaging of breast: Secondary | ICD-10-CM

## 2016-04-15 ENCOUNTER — Telehealth: Payer: Self-pay

## 2016-04-15 NOTE — Telephone Encounter (Signed)
Contacted pt to go over lab results pt didn't answer lvm asking pt to give me a call at her earliest convenience   If pt calls back please give results: Left breast was abnml on MM, needs tomo exam and breast US on left

## 2016-04-19 MED FILL — ATORVASTATIN 20 MG TABLET: 20 | 30 days supply | Qty: 30 | Fill #1

## 2016-04-21 ENCOUNTER — Telehealth (INDEPENDENT_AMBULATORY_CARE_PROVIDER_SITE_OTHER): Payer: Self-pay | Admitting: *Deleted

## 2016-04-21 NOTE — Telephone Encounter (Signed)
Pt calling stating she needs amitriptyline 25mg  2 a day. The pharmacy only has 1x a day.

## 2016-04-21 NOTE — Telephone Encounter (Signed)
Pt calling stating she needs amitriptyline 25mg  2 a day. The pharmacy only has 1x a day

## 2016-04-27 ENCOUNTER — Telehealth (INDEPENDENT_AMBULATORY_CARE_PROVIDER_SITE_OTHER): Payer: Self-pay | Admitting: Specialist

## 2016-04-27 NOTE — Telephone Encounter (Signed)
Patient called asking for her prescription to be changed from once a day to twice a day on the Amitriphyline. CB # [phone]

## 2016-04-27 NOTE — Telephone Encounter (Signed)
Patient called about this last week-----Patient called asking for her prescription to be changed from once a day to twice a day on the Amitriphyline

## 2016-04-28 ENCOUNTER — Other Ambulatory Visit (INDEPENDENT_AMBULATORY_CARE_PROVIDER_SITE_OTHER): Payer: Self-pay | Admitting: Specialist

## 2016-04-28 ENCOUNTER — Other Ambulatory Visit (HOSPITAL_COMMUNITY): Payer: Self-pay | Admitting: *Deleted

## 2016-04-28 DIAGNOSIS — R519 Headache, unspecified: Secondary | ICD-10-CM

## 2016-04-28 DIAGNOSIS — G5621 Lesion of ulnar nerve, right upper limb: Secondary | ICD-10-CM

## 2016-04-28 DIAGNOSIS — M4722 Other spondylosis with radiculopathy, cervical region: Secondary | ICD-10-CM

## 2016-04-28 DIAGNOSIS — R928 Other abnormal and inconclusive findings on diagnostic imaging of breast: Secondary | ICD-10-CM

## 2016-04-28 DIAGNOSIS — R51 Headache: Secondary | ICD-10-CM

## 2016-04-28 MED ORDER — AMITRIPTYLINE HCL 25 MG PO TABS: 25.0000 mg | ORAL_TABLET | Freq: Two times a day (BID) | ORAL | 2 refills | Status: DC

## 2016-04-28 NOTE — Telephone Encounter (Signed)
I called patient and advised ---she states that she hasn't started the chantix yet, she has stopped the gabapentin and she has just started the lyrica.  But she will discuss with her PCP.

## 2016-04-28 NOTE — Telephone Encounter (Signed)
Should try taking 2 po at night not in AM and PM. Cheryl King

## 2016-04-28 NOTE — Telephone Encounter (Signed)
She need to be careful about interactions between her medications, amitryptyline, lyrica, gabapentin and chantix all Work on the same system and it is possible to have significant reaction due to interaction of the medications.  I renewed but she should talk to her primary care about using all these medications. Please let her know.  jen

## 2016-04-29 ENCOUNTER — Other Ambulatory Visit: Payer: No Typology Code available for payment source

## 2016-04-29 ENCOUNTER — Other Ambulatory Visit: Payer: Self-pay | Admitting: Internal Medicine

## 2016-04-29 MED FILL — raNITIdine HCL 150 MG TABS: 150 | 30 days supply | Qty: 60 | Fill #3

## 2016-04-29 MED FILL — AMITRIPTYLINE HCL 25 MG TAB: 25 | 30 days supply | Qty: 60 | Fill #0

## 2016-05-03 ENCOUNTER — Encounter: Payer: Self-pay | Admitting: Internal Medicine

## 2016-05-03 ENCOUNTER — Ambulatory Visit: Payer: No Typology Code available for payment source | Attending: Internal Medicine | Admitting: Internal Medicine

## 2016-05-03 ENCOUNTER — Other Ambulatory Visit: Payer: Self-pay | Admitting: Internal Medicine

## 2016-05-03 VITALS — BP 102/69 | HR 76 | Temp 98.5°F | Resp 16 | Wt 158.5 lb

## 2016-05-03 DIAGNOSIS — Z131 Encounter for screening for diabetes mellitus: Secondary | ICD-10-CM

## 2016-05-03 DIAGNOSIS — G5600 Carpal tunnel syndrome, unspecified upper limb: Secondary | ICD-10-CM | POA: Insufficient documentation

## 2016-05-03 DIAGNOSIS — R599 Enlarged lymph nodes, unspecified: Secondary | ICD-10-CM

## 2016-05-03 DIAGNOSIS — R7303 Prediabetes: Secondary | ICD-10-CM

## 2016-05-03 DIAGNOSIS — Z7984 Long term (current) use of oral hypoglycemic drugs: Secondary | ICD-10-CM | POA: Insufficient documentation

## 2016-05-03 DIAGNOSIS — R590 Localized enlarged lymph nodes: Secondary | ICD-10-CM | POA: Insufficient documentation

## 2016-05-03 DIAGNOSIS — K219 Gastro-esophageal reflux disease without esophagitis: Secondary | ICD-10-CM | POA: Insufficient documentation

## 2016-05-03 LAB — POCT GLYCOSYLATED HEMOGLOBIN (HGB A1C): HEMOGLOBIN A1C: 5.9

## 2016-05-03 MED ORDER — CYCLOBENZAPRINE HCL 10 MG PO TABS: ORAL_TABLET | ORAL | 0 refills | Status: DC

## 2016-05-03 MED ORDER — METFORMIN HCL 500 MG PO TABS: 500.0000 mg | ORAL_TABLET | Freq: Every day | ORAL | 3 refills | Status: DC

## 2016-05-03 MED FILL — ?METFORMIN HCL 500MG TABLET: 500 | 30 days supply | Qty: 30 | Fill #0

## 2016-05-03 MED FILL — ?CYCLOBENZAPRINE 10 MG TABL: 10 | 18 days supply | Qty: 55 | Fill #0

## 2016-05-03 NOTE — Progress Notes (Addendum)
Cheryl King, is a 51 y.o. female  OMV:[id number]  GGE:[id number]  DOB - 10/07/1965  Chief Complaint  Patient presents with  . Adenopathy        Subjective:   Cheryl King is a 51 y.o. female here today for a follow up visit.  Pt states that she took gabapentin for 2-3 wks and started noticing glands swollen in her left jaw. She is currently not taking any gabapentin, taking lyrica, but thought she still felt some swollen glands.  Denies f/c/night sweats/cough/nasal congestion/sob/cp.  Still notice mild intermittent rlq abd pain, same time of month. Suspect ovulation related.  Patient has No headache, No chest pain, No abdominal pain - No Nausea, No new weakness tingling or numbness, No Cough - SOB.  No problems updated.  ALLERGIES: Allergies  Allergen Reactions  . Percocet [Oxycodone-Acetaminophen] Nausea Only  . Vicodin [Hydrocodone-Acetaminophen] Nausea Only  . Penicillins Rash    PAST MEDICAL HISTORY: Past Medical History:  Diagnosis Date  . Acid reflux   . Carpal tunnel syndrome   . Diverticulitis   . Low back pain   . 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 Sciatica   . Seasonal allergies     MEDICATIONS AT HOME: Prior to Admission medications   Medication Sig Start Date End Date Taking? Authorizing Provider  amitriptyline (ELAVIL) 25 MG tablet Take 1 tablet (25 mg total) by mouth 2 (two) times daily. 04/28/16   Jessy Oto, MD  atorvastatin (LIPITOR) 20 MG tablet TAKE 1 TABLET BY MOUTH DAILY 03/18/16   Maren Reamer, MD  cyclobenzaprine (FLEXERIL) 10 MG tablet TAKE 1 TABLET BY MOUTH 3 TIMES DAILY AS NEEDED FOR MUSCLE SPASMS. 03/30/16   Maren Reamer, MD  diclofenac (VOLTAREN) 50 MG EC tablet TAKE 1 TABLET BY MOUTH 2 TO 3 TIMES DAILY WITH FOOD AS NEEDED FOR INFLAMMATION 03/31/16   Jessy Oto, MD  diclofenac sodium (VOLTAREN) 1 % GEL Apply 2 g topically 4 (four) times daily.    Historical Provider, MD  gabapentin (NEURONTIN) 300 MG capsule Take  1 capsule (300 mg total) by mouth 3 (three) times daily. Take one tablet po qhs for 5 days then one tablet po BID for 5 days then one tablet po TID. Patient not taking: Reported on 05/03/2016 03/01/16   Marcial Pacas, MD  Lactobacillus CAPS Take 1 capsule by mouth daily.    Historical Provider, MD  Multiple Vitamin (MULTIVITAMIN WITH MINERALS) TABS tablet Take 1 tablet by mouth daily.    Historical Provider, MD  ondansetron (ZOFRAN) 8 MG tablet Take 1 tablet (8 mg total) by mouth every 8 (eight) hours as needed for nausea or vomiting. Patient not taking: Reported on 05/03/2016 01/30/14   [place], PA-C  pregabalin (LYRICA) 50 MG capsule Take 1 capsule (50 mg total) by mouth at bedtime. 02/09/16   Tresa Garter, MD  ranitidine (ZANTAC) 150 MG tablet Take 1 tablet (150 mg total) by mouth 2 (two) times daily. 01/28/16   Maren Reamer, MD  traMADol (ULTRAM) 50 MG tablet Take 50 mg by mouth every 6 (six) hours as needed.    Historical Provider, MD  triamcinolone ointment (KENALOG) 0.5 % Apply 1 application topically 2 (two) times daily. Patient not taking: Reported on 05/03/2016 01/28/16   Maren Reamer, MD  varenicline (CHANTIX CONTINUING MONTH PAK) 1 MG tablet Take 1 tablet (1 mg total) by mouth 2 (two) times daily. 02/09/16   Tresa Garter, MD  varenicline (Crisp  PAK) 0.5 MG X 11 & 1 MG X 42 tablet Use as directed 02/09/16   Tresa Garter, MD     Objective:   Vitals:   05/03/16 0850  BP: 102/69  Pulse: 76  Resp: 16  Temp: 98.5 F (36.9 C)  TempSrc: Oral  SpO2: 95%  Weight: 158 lb 8 oz (71.9 kg)    Exam General appearance : Awake, alert, not in any distress. Speech Clear. Not toxic looking, pleasant. HEENT: Atraumatic and Normocephalic, pupils equally reactive to light. Poor dentition. Neck: supple, no JVD. No cervical lymphadenopathy.  bilat TMs clear.  Chest:Good air entry bilaterally, no added sounds. CVS: S1 S2 regular, no murmurs/gallups or  rubs. Abdomen: Bowel sounds active, Non tender Neurology: Awake alert, and oriented X 3, CN II-XII grossly intact, Non focal Skin:No Rash  Data Review Lab Results  Component Value Date   HGBA1C 5.9 05/03/2016    Depression screen [facility] 2/9 05/03/2016 01/28/2016 08/22/2015 05/13/2015 01/23/2015  Decreased Interest 1 3 1 1 3   Down, Depressed, Hopeless 3 1 1 1 3   PHQ - 2 Score 4 4 2 2 6   Altered sleeping 1 3 3  0 2  Tired, decreased energy 1 1 1  0 2  Change in appetite 0 0 0 0 3  Feeling bad or failure about yourself  1 1 1 1 3   Trouble concentrating 0 0 1 0 2  Moving slowly or fidgety/restless 0 0 0 0 2  Suicidal thoughts 0 1 1 1 1   PHQ-9 Score 7 10 9 4 21       Assessment & Plan   1. Swollen lymph nodes, left, none detected today. - ddx/dental /gums related/ect, recd gargle w/ warm salt water, suck on lemon drops if obstructed salivary gland, f/u w/ dentist appt. - CBC with Differential - Basic metabolic panel - consider further testing if does not resolve in next 1-2 wks w/ above treatments.  2. Diabetes mellitus screening - POCT glycosylated hemoglobin (Hb A1C)  3. Prediabetes  dw pt diet/exercise, options, pt interested in starting metformin. Metformin helps your body process sugars better, also helps w/ some weight loss as well.   But can cause some indigestion/n/diarrhea, but sx usually resolved in 2 wks or so.  If sxs persist, call us and can switch to long acting metformin.   Patient have been counseled extensively about nutrition and exercise  Return in about 3 months (around 08/02/2016), or if symptoms worsen or fail to improve.  The patient was given clear instructions to go to ER or return to medical center if symptoms don't improve, worsen or new problems develop. The patient verbalized understanding. The patient was told to call to get lab results if they haven't heard anything in the next week.   This note has been created with Engineer, agricultural. Any transcriptional errors are unintentional.   Maren Reamer, MD, [place] [facility] [place], [place]   05/03/2016, 8:59 AM

## 2016-05-03 NOTE — Patient Instructions (Addendum)
- gargle with warm salt water. - suck on lemon drops. - dental following.  - Preventing Type 2 Diabetes Mellitus Type 2 diabetes (type 2 diabetes mellitus) is a long-term (chronic) disease that affects blood sugar (glucose) levels. Normally, a hormone called insulin allows glucose to enter cells in the body. The cells use glucose for energy. In type 2 diabetes, one or both of these problems may be present:  The body does not make enough insulin.  The body does not respond properly to insulin that it makes (insulin resistance). Insulin resistance or lack of insulin causes excess glucose to build up in the blood instead of going into cells. As a result, high blood glucose (hyperglycemia) develops, which can cause many complications. Being overweight or obese and having an inactive (sedentary) lifestyle can increase your risk for diabetes. Type 2 diabetes can be delayed or prevented by making certain nutrition and lifestyle changes. What nutrition changes can be made?  Eat healthy meals and snacks regularly. Keep a healthy snack with you for when you get hungry between meals, such as fruit or a handful of nuts.  Eat lean meats and proteins that are low in saturated fats, such as chicken, fish, egg whites, and beans. Avoid processed meats.  Eat plenty of fruits and vegetables and plenty of grains that have not been processed (whole grains). It is recommended that you eat:  1?2 cups of fruit every day.  2?3 cups of vegetables every day.  6?8 oz of whole grains every day, such as oats, whole wheat, bulgur, brown rice, quinoa, and millet.  Eat low-fat dairy products, such as milk, yogurt, and cheese.  Eat foods that contain healthy fats, such as nuts, avocado, olive oil, and canola oil.  Drink water throughout the day. Avoid drinks that contain added sugar, such as soda or sweet tea.  Follow instructions from your health care provider about specific eating or drinking  restrictions.  Control how much food you eat at a time (portion size).  Check food labels to find out the serving sizes of foods.  Use a kitchen scale to weigh amounts of foods.  Saute or steam food instead of frying it. Cook with water or broth instead of oils or butter.  Limit your intake of:  Salt (sodium). Have no more than 1 tsp (2,400 mg) of sodium a day. If you have heart disease or high blood pressure, have less than ? tsp (1,500 mg) of sodium a day.  Saturated fat. This is fat that is solid at room temperature, such as butter or fat on meat. What lifestyle changes can be made?   Activity   Do moderate-intensity physical activity for at least 30 minutes on at least 5 days of the week, or as much as told by your health care provider.  Ask your health care provider what activities are safe for you. A mix of physical activities may be best, such as walking, swimming, cycling, and strength training.  Try to add physical activity into your day. For example:  Park in spots that are farther away than usual, so that you walk more. For example, park in a far corner of the parking lot when you go to the office or the grocery store.  Take a walk during your lunch break.  Use stairs instead of elevators or escalators. Weight Loss   Lose weight as directed. Your health care provider can determine how much weight loss is best for you and can help you lose weight  safely.  If you are overweight or obese, you may be instructed to lose at least 5?7 % of your body weight. Alcohol and Tobacco      Do not use any tobacco products, such as cigarettes, chewing tobacco, and e-cigarettes. If you need help quitting, ask your health care provider. Work With [place] Provider   Have your blood glucose tested regularly, as told by your health care provider.  Discuss your risk factors and how you can reduce your risk for diabetes.  Get screening tests as told by your health care  provider. You may have screening tests regularly, especially if you have certain risk factors for type 2 diabetes.  Make an appointment with a diet and nutrition specialist (registered dietitian). A registered dietitian can help you make a healthy eating plan and can help you understand portion sizes and food labels. Why are these changes important?  It is possible to prevent or delay type 2 diabetes and related health problems by making lifestyle and nutrition changes.  It can be difficult to recognize signs of type 2 diabetes. The best way to avoid possible damage to your body is to take actions to prevent the disease before you develop symptoms. What can happen if changes are not made?  Your blood glucose levels may keep increasing. Having high blood glucose for a long time is dangerous. Too much glucose in your blood can damage your blood vessels, heart, kidneys, nerves, and eyes.  You may develop prediabetes or type 2 diabetes. Type 2 diabetes can lead to many chronic health problems and complications, such as:  Heart disease.  Stroke.  Blindness.  Kidney disease.  Depression.  Poor circulation in the feet and legs, which could lead to surgical removal (amputation) in severe cases. Where to find support:  Ask your health care provider to recommend a registered dietitian, diabetes educator, or weight loss program.  Look for local or online weight loss groups.  Join a gym, fitness club, or outdoor activity group, such as a walking club. Where to find more information: To learn more about diabetes and diabetes prevention, visit:  American Diabetes Association (ADA): www.diabetes.[employer] of Diabetes and Digestive and Kidney Diseases: FindSpin.nl To learn more about healthy eating, visit:  The U.S. Department of Agriculture Scientist, research (physical sciences)), Choose My Plate: http://wiley-williams.com/  Office of Disease Prevention and Health  Promotion (ODPHP), Dietary Guidelines: SurferLive.at Summary  You can reduce your risk for type 2 diabetes by increasing your physical activity, eating healthy foods, and losing weight as directed.  Talk with your health care provider about your risk for type 2 diabetes. Ask about any blood tests or screening tests that you need to have. This information is not intended to replace advice given to you by your health care provider. Make sure you discuss any questions you have with your health care provider. Document Released: 04/28/2015 Document Revised: 06/12/2015 Document Reviewed: 02/25/2015 Elsevier Interactive Patient Education  2017 [employer].

## 2016-05-03 NOTE — Addendum Note (Signed)
Addended by: Jackelyn Knife on: 05/03/2016 09:28 AM   Modules accepted: Orders

## 2016-05-03 NOTE — Addendum Note (Signed)
Addended byLottie Mussel T on: 05/03/2016 09:31 AM   Modules accepted: Orders

## 2016-05-04 LAB — CBC WITH DIFFERENTIAL/PLATELET
Basophils Absolute: 0 10*3/uL (ref 0.0–0.2)
Basos: 0 %
EOS (ABSOLUTE): 0.2 10*3/uL (ref 0.0–0.4)
EOS: 2 %
HEMATOCRIT: 43.8 % (ref 34.0–46.6)
Hemoglobin: 14.9 g/dL (ref 11.1–15.9)
Immature Grans (Abs): 0 10*3/uL (ref 0.0–0.1)
Immature Granulocytes: 0 %
Lymphocytes Absolute: 2.5 10*3/uL (ref 0.7–3.1)
Lymphs: 33 %
MCH: 31.6 pg (ref 26.6–33.0)
MCHC: 34 g/dL (ref 31.5–35.7)
MCV: 93 fL (ref 79–97)
MONOS ABS: 0.7 10*3/uL (ref 0.1–0.9)
Monocytes: 9 %
NEUTROS ABS: 4.3 10*3/uL (ref 1.4–7.0)
Neutrophils: 56 %
Platelets: 246 10*3/uL (ref 150–379)
RBC: 4.71 x10E6/uL (ref 3.77–5.28)
RDW: 14.6 % (ref 12.3–15.4)
WBC: 7.6 10*3/uL (ref 3.4–10.8)

## 2016-05-04 LAB — BASIC METABOLIC PANEL
BUN/Creatinine Ratio: 24 — ABNORMAL HIGH (ref 9–23)
BUN: 18 mg/dL (ref 6–24)
CO2: 25 mmol/L (ref 18–29)
Calcium: 9.8 mg/dL (ref 8.7–10.2)
Chloride: 103 mmol/L (ref 96–106)
Creatinine, Ser: 0.74 mg/dL (ref 0.57–1.00)
GFR, EST AFRICAN AMERICAN: 108 mL/min/{1.73_m2} (ref >59)
GFR, EST NON AFRICAN AMERICAN: 93 mL/min/{1.73_m2} (ref >59)
Glucose: 81 mg/dL (ref 65–99)
POTASSIUM: 4.9 mmol/L (ref 3.5–5.2)
Sodium: 143 mmol/L (ref 134–144)

## 2016-05-06 ENCOUNTER — Ambulatory Visit
Admission: RE | Admit: 2016-05-06 | Discharge: 2016-05-06 | Disposition: A | Discharge status: Home / Self Care | Payer: No Typology Code available for payment source | Source: Ambulatory Visit | Attending: Obstetrics and Gynecology | Admitting: Obstetrics and Gynecology

## 2016-05-06 ENCOUNTER — Ambulatory Visit (HOSPITAL_COMMUNITY)
Admission: RE | Admit: 2016-05-06 | Discharge: 2016-05-06 | Disposition: A | Discharge status: Home / Self Care | Payer: Self-pay | Source: Ambulatory Visit | Attending: Obstetrics and Gynecology | Admitting: Obstetrics and Gynecology

## 2016-05-06 ENCOUNTER — Encounter (HOSPITAL_COMMUNITY): Payer: Self-pay

## 2016-05-06 VITALS — BP 102/70 | Temp 98.8°F | Ht 67.0 in | Wt 159.8 lb

## 2016-05-06 DIAGNOSIS — R928 Other abnormal and inconclusive findings on diagnostic imaging of breast: Secondary | ICD-10-CM

## 2016-05-06 DIAGNOSIS — Z1239 Encounter for other screening for malignant neoplasm of breast: Secondary | ICD-10-CM

## 2016-05-06 NOTE — Addendum Note (Signed)
Encounter addended by: Loletta Parish, RN on: 05/06/2016  2:36 PM<BR>    Actions taken: Sign clinical note

## 2016-05-06 NOTE — Progress Notes (Addendum)
Patient referred to [facility] by the [facility] due to recommending additional imaging of the left breast. Screening mammogram completed 04/13/2016.  Pap Smear: Pap smear not completed today. Last Pap smear was prior to hysterectomy in 2005 and normal per patient. Per patient has no history of an abnormal Pap smear. No Pap smear results are in EPIC.  Physical exam: Breasts Breasts symmetrical. No skin abnormalities left breast. Scar right outer breast that per patient is from a previous breast surgery to remove a benign lump. No nipple retraction bilateral breasts. No nipple discharge bilateral breasts. No lymphadenopathy. No lumps palpated bilateral breasts. No complaints of pain or tenderness on exam. Referred patient to the [facility] for left breast diagnostic mammogram and possible breast ultrasound per recommendation. Appointment scheduled for Thursday, May 06, 2016 at 1040.        Pelvic/Bimanual No Pap smear completed today since patient has a history of a hysterectomy for benign reasons. Pap smear not indicated per BCCCP guidelines.   Smoking History: Patient is a current smoker. Discussed smoking cessation with patient. Referred to the [facility] and gave resources to free smoking cessation classes at [facility].   Patient Navigation: Patient education provided. Access to services provided for patient through [facility] program.   Colorectal Cancer Screening: Per patient has never had a colonoscopy completed. No complaints today. FIT Test given to patient to complete and return to BCCCP.

## 2016-05-06 NOTE — Patient Instructions (Addendum)
Explained breast self awareness with Cheryl King. Patient did not need a Pap smear today due to her history of a hysterectomy for benign reasons. Let patient know that she doesn't need any further Pap smears due to her history of a hysterectomy for benign reasons. Referred patient to the [facility] for left breast diagnostic mammogram and possible breast ultrasound per recommendation. Appointment scheduled for Thursday, May 06, 2016 at 1040. Discussed smoking cessation with patient. Referred to the [facility] and gave resources to free smoking cessation classes at [facility]. Cheryl King verbalized understanding.  Allien Melberg, Arvil Chaco, RN 12:37 PM

## 2016-05-07 ENCOUNTER — Telehealth: Payer: Self-pay

## 2016-05-07 NOTE — Telephone Encounter (Signed)
Contacted pt to go over lab results pt didn't answer lvm informing pt that lab work was normal and if she has any questions or concerns to give [place] a call

## 2016-05-10 ENCOUNTER — Telehealth: Payer: Self-pay | Admitting: Internal Medicine

## 2016-05-10 ENCOUNTER — Encounter (HOSPITAL_COMMUNITY): Payer: Self-pay | Admitting: *Deleted

## 2016-05-10 DIAGNOSIS — K029 Dental caries, unspecified: Secondary | ICD-10-CM

## 2016-05-10 NOTE — Telephone Encounter (Signed)
Pt calling to request a dental referral. Please f/u. Thank you.

## 2016-05-11 NOTE — Telephone Encounter (Signed)
Referral to dentist placed. She needs to have  [place] card/[facility] discount. thx

## 2016-05-17 ENCOUNTER — Other Ambulatory Visit: Payer: Self-pay | Admitting: Internal Medicine

## 2016-05-18 ENCOUNTER — Other Ambulatory Visit: Payer: Self-pay | Admitting: Obstetrics and Gynecology

## 2016-05-19 MED FILL — DICLOFENAC SOD EC 50 MG TAB: 50 | 30 days supply | Qty: 90 | Fill #1

## 2016-05-19 MED FILL — ATORVASTATIN 20 MG TABLET: 20 | 30 days supply | Qty: 30 | Fill #2

## 2016-05-20 ENCOUNTER — Other Ambulatory Visit: Payer: Self-pay | Admitting: Internal Medicine

## 2016-05-21 LAB — PLEASE NOTE

## 2016-05-21 LAB — FECAL OCCULT BLOOD, IMMUNOCHEMICAL: FECAL OCCULT BLD: NEGATIVE

## 2016-05-21 MED FILL — raNITIdine HCL 150 MG TABS: 150 | 30 days supply | Qty: 60 | Fill #0

## 2016-05-25 MED FILL — CYCLOBENZAPRINE 10 MG TAB: 10 | 18 days supply | Qty: 55 | Fill #0

## 2016-05-26 ENCOUNTER — Encounter (HOSPITAL_COMMUNITY): Payer: Self-pay | Admitting: *Deleted

## 2016-05-26 MED FILL — AMITRIPTYLINE HCL 25 MG TAB: 25 | 30 days supply | Qty: 60 | Fill #1

## 2016-05-26 NOTE — Progress Notes (Signed)
Letter mailed to patient informing of negative Fit Test results.

## 2016-06-03 ENCOUNTER — Encounter: Payer: Self-pay | Admitting: Internal Medicine

## 2016-06-17 MED FILL — ?ATORVASTATIN 20 MG TABLET: 20 | 30 days supply | Qty: 30 | Fill #3

## 2016-06-28 ENCOUNTER — Other Ambulatory Visit (INDEPENDENT_AMBULATORY_CARE_PROVIDER_SITE_OTHER): Payer: Self-pay | Admitting: Specialist

## 2016-06-28 DIAGNOSIS — R519 Headache, unspecified: Secondary | ICD-10-CM

## 2016-06-28 DIAGNOSIS — M4722 Other spondylosis with radiculopathy, cervical region: Secondary | ICD-10-CM

## 2016-06-28 DIAGNOSIS — G5621 Lesion of ulnar nerve, right upper limb: Secondary | ICD-10-CM

## 2016-06-28 DIAGNOSIS — R51 Headache: Secondary | ICD-10-CM

## 2016-06-28 MED FILL — DICLOFENAC SOD EC 50 MG TAB: 50 | 30 days supply | Qty: 90 | Fill #2

## 2016-06-28 MED FILL — AMITRIPTYLINE HCL 25 MG TAB: 25 | 30 days supply | Qty: 60 | Fill #2

## 2016-06-28 MED FILL — raNITIdine HCL 150 MG TABS: 150 | 30 days supply | Qty: 60 | Fill #1

## 2016-06-28 NOTE — Telephone Encounter (Signed)
Amitriptyline refill request 

## 2016-07-12 ENCOUNTER — Ambulatory Visit (HOSPITAL_COMMUNITY)
Admission: EM | Admit: 2016-07-12 | Discharge: 2016-07-12 | Disposition: A | Discharge status: Home / Self Care | Payer: No Typology Code available for payment source | Attending: Family Medicine | Admitting: Family Medicine

## 2016-07-12 ENCOUNTER — Encounter (HOSPITAL_COMMUNITY): Payer: Self-pay | Admitting: Emergency Medicine

## 2016-07-12 DIAGNOSIS — J209 Acute bronchitis, unspecified: Secondary | ICD-10-CM

## 2016-07-12 DIAGNOSIS — R05 Cough: Secondary | ICD-10-CM

## 2016-07-12 DIAGNOSIS — R062 Wheezing: Secondary | ICD-10-CM

## 2016-07-12 MED ORDER — BENZONATATE 100 MG PO CAPS: 100.0000 mg | ORAL_CAPSULE | Freq: Three times a day (TID) | ORAL | 0 refills | Status: DC

## 2016-07-12 MED ORDER — PREDNISONE 50 MG PO TABS: ORAL_TABLET | ORAL | 0 refills | Status: DC

## 2016-07-12 MED ORDER — AZITHROMYCIN 250 MG PO TABS: 250.0000 mg | ORAL_TABLET | Freq: Every day | ORAL | 0 refills | Status: DC

## 2016-07-12 MED FILL — BENZONATATE 100 MG CAPSULE: 100 | 7 days supply | Qty: 21 | Fill #0

## 2016-07-12 MED FILL — AZITHROMYCIN 250 MG TABLET: 250 | 5 days supply | Qty: 6 | Fill #0

## 2016-07-12 MED FILL — ?PREDNISONE 10 MG TABLET: 10 | 5 days supply | Qty: 25 | Fill #0

## 2016-07-12 NOTE — ED Provider Notes (Signed)
CSN: [id number]     Arrival date & time 07/12/16  1345 History   First MD Initiated Contact with Patient 07/12/16 1428     Chief Complaint  Patient presents with  . URI   (Consider location/radiation/quality/duration/timing/severity/associated sxs/prior Treatment) Cheryl King is a 51 y.o. female with a past history of acid reflux, diverticulitis, sciatica, and allergies, who presents to the [facility] urgent care with a chief complaint of cough for a week and a half. States that approximately 3 weeks ago she came down with upper respiratory infection, she treated with over-the-counter therapies, and was being to fill mostly better, until a cough started. She describes as productive, hacking, with yellow sputum, she denies coughing up any blood, no fever, chills, weakness, dyspnea with exertion, or other concerning symptoms.   The history is provided by the patient.  URI  Presenting symptoms: cough   Associated symptoms: wheezing     Past Medical History:  Diagnosis Date  . Acid reflux   . Carpal tunnel syndrome   . Diverticulitis   . Low back pain   . 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 Sciatica   . Seasonal allergies    Past Surgical History:  Procedure Laterality Date  . ABDOMINAL HYSTERECTOMY    . ABDOMINAL SURGERY    . ANKLE SURGERY     lt.  . right breast cyst removed     . uretherl     uretrral diverticultis   Family History  Problem Relation Age of Onset  . Healthy Mother   . Other Father        Unsure of medical history  . Asthma Maternal Aunt   . Diabetes Maternal Aunt   . Cancer Maternal Aunt   . Breast cancer Maternal Aunt   . Hypertension Maternal Grandmother   . Colon cancer Neg Hx   . Pancreatic cancer Neg Hx   . Rectal cancer Neg Hx   . Stomach cancer Neg Hx    Social History  Substance Use Topics  . Smoking status: Current Every Day Smoker    Packs/day: 0.50    Years: 35.00  . Smokeless tobacco: Never Used  . Alcohol use Yes   Comment: Social only - seldom   OB History    Gravida Para Term Preterm AB Living   1       1     SAB TAB Ectopic Multiple Live Births   1             Review of Systems  Constitutional: Negative.   HENT: Negative.   Respiratory: Positive for cough and wheezing. Negative for shortness of breath.   Cardiovascular: Negative for chest pain and palpitations.  Gastrointestinal: Negative.   Musculoskeletal: Negative.   Skin: Negative.   Neurological: Negative.     Allergies  Percocet [oxycodone-acetaminophen]; Vicodin [hydrocodone-acetaminophen]; and Penicillins  Home Medications   Prior to Admission medications   Medication Sig Start Date End Date Taking? Authorizing Provider  amitriptyline (ELAVIL) 25 MG tablet TAKE 1 TABLET BY MOUTH 2 TIMES DAILY. 06/28/16  Yes Jessy Oto, MD  atorvastatin (LIPITOR) 20 MG tablet TAKE 1 TABLET BY MOUTH DAILY 03/18/16  Yes Langeland, Dawn T, MD  cyclobenzaprine (FLEXERIL) 10 MG tablet TAKE 1 TABLET BY MOUTH 3 TIMES DAILY AS NEEDED FOR MUSCLE SPASMS. 05/25/16  Yes Langeland, Dawn T, MD  diclofenac (VOLTAREN) 50 MG EC tablet TAKE 1 TABLET BY MOUTH 2 TO 3 TIMES DAILY WITH FOOD AS NEEDED FOR INFLAMMATION 03/31/16  Yes Jessy Oto, MD  diclofenac sodium (VOLTAREN) 1 % GEL Apply 2 g topically 4 (four) times daily.   Yes [provider]  Lactobacillus CAPS Take 1 capsule by mouth daily.   Yes [provider]  Multiple Vitamin (MULTIVITAMIN WITH MINERALS) TABS tablet Take 1 tablet by mouth daily.   Yes [provider]  pregabalin (LYRICA) 50 MG capsule Take 1 capsule (50 mg total) by mouth at bedtime. 02/09/16  Yes Jegede, Olugbemiga E, MD  ranitidine (ZANTAC) 150 MG tablet TAKE 1 TABLET BY MOUTH 2 TIMES DAILY. 05/21/16  Yes Langeland, Dawn T, MD  traMADol (ULTRAM) 50 MG tablet Take 50 mg by mouth every 6 (six) hours as needed.   Yes [provider]  azithromycin (ZITHROMAX) 250 MG tablet Take 1 tablet (250 mg total) by mouth  daily. Take first 2 tablets together, then 1 every day until finished. 07/12/16   Barnet Glasgow, NP  benzonatate (TESSALON) 100 MG capsule Take 1 capsule (100 mg total) by mouth every 8 (eight) hours. 07/12/16   Barnet Glasgow, NP  gabapentin (NEURONTIN) 300 MG capsule Take 1 capsule (300 mg total) by mouth 3 (three) times daily. Take one tablet po qhs for 5 days then one tablet po BID for 5 days then one tablet po TID. Patient not taking: Reported on 05/03/2016 03/01/16   Marcial Pacas, MD  metFORMIN (GLUCOPHAGE) 500 MG tablet Take 1 tablet (500 mg total) by mouth daily with breakfast. 05/03/16   Maren Reamer, MD  predniSONE (DELTASONE) 50 MG tablet Take 1 tablet daily with food 07/12/16   Barnet Glasgow, NP  varenicline (CHANTIX CONTINUING MONTH PAK) 1 MG tablet Take 1 tablet (1 mg total) by mouth 2 (two) times daily. Patient not taking: Reported on 05/06/2016 02/09/16   Tresa Garter, MD  varenicline (CHANTIX STARTING MONTH PAK) 0.5 MG X 11 & 1 MG X 42 tablet Use as directed Patient not taking: Reported on 05/06/2016 02/09/16   Tresa Garter, MD   Meds Ordered and Administered this Visit  Medications - No data to display  BP 93/71 (BP Location: Right Arm)   Pulse 79   Temp 98.6 F (37 C) (Oral)   Resp 16   SpO2 95%  No data found.   Physical Exam  Constitutional: She is oriented to person, place, and time. She appears well-developed and well-nourished. No distress.  HENT:  Head: Normocephalic and atraumatic.  Right Ear: Tympanic membrane and external ear normal.  Left Ear: Tympanic membrane and external ear normal.  Eyes: Conjunctivae are normal.  Neck: Normal range of motion.  Cardiovascular: Normal rate and regular rhythm.   Pulmonary/Chest: Effort normal and breath sounds normal. She has no wheezes.  Lymphadenopathy:    She has no cervical adenopathy.  Neurological: She is alert and oriented to person, place, and time.  Skin: Skin is warm and dry. Capillary  refill takes less than 2 seconds. No rash noted. She is not diaphoretic. No erythema.  Psychiatric: She has a normal mood and affect. Her behavior is normal.  Nursing note and vitals reviewed.   Urgent Care Course     Procedures (including critical care time)  Labs Review Labs Reviewed - No data to display  Imaging Review No results found.      MDM   1. Acute bronchitis, unspecified organism     Cheryl King is a 51 y.o. female with a past history of acid reflux, diverticulitis, sciatica, and allergies, who presents to  the [facility] urgent care with a chief complaint of cough for a week and a half. States that approximately 3 weeks ago she came down with upper respiratory infection, she treated with over-the-counter therapies, and was being to fill mostly better, until a cough started. She describes as productive, hacking, with yellow sputum, she denies coughing up any blood, no fever, chills, weakness, dyspnea with exertion, or other concerning symptoms.  Symptoms are consistent with bronchitis, treating with azithromycin, Tessalon, and prednisone. Provided counseling on over-the-counter therapies for symptom management, recommended rest, plenty of fluids, return to clinic as needed, or follow-up with primary care.    Barnet Glasgow, NP 07/12/16 1443

## 2016-07-12 NOTE — Discharge Instructions (Signed)
I am treating you for bronchitis. I have prescribed Azithromycin. Take 2 tablets today, then 1 tablet daily till finished. I have also prescribed a steroid called prednisone. Take one tablet daily with food. For cough, I have prescribed a medication called Tessalon. Take 1 tablet every 8 hours as needed for your cough. Should your symptoms fail to improve or worsen, follow up with your primary care provider, or return to clinic.

## 2016-07-12 NOTE — ED Triage Notes (Signed)
Pt here for cold sx onset 3 weeks associated w/prod cough  Voices no other concerns.... A&O x4... NAD... Ambulatory

## 2016-07-16 ENCOUNTER — Encounter: Payer: Self-pay | Admitting: Family Medicine

## 2016-07-16 ENCOUNTER — Ambulatory Visit: Payer: Self-pay | Attending: Family Medicine | Admitting: Family Medicine

## 2016-07-16 VITALS — BP 105/67 | HR 72 | Temp 98.3°F | Resp 18 | Ht 67.0 in | Wt 156.0 lb

## 2016-07-16 DIAGNOSIS — G5600 Carpal tunnel syndrome, unspecified upper limb: Secondary | ICD-10-CM | POA: Insufficient documentation

## 2016-07-16 DIAGNOSIS — Z880 Allergy status to penicillin: Secondary | ICD-10-CM | POA: Insufficient documentation

## 2016-07-16 DIAGNOSIS — G8929 Other chronic pain: Secondary | ICD-10-CM | POA: Insufficient documentation

## 2016-07-16 DIAGNOSIS — Z90710 Acquired absence of both cervix and uterus: Secondary | ICD-10-CM | POA: Insufficient documentation

## 2016-07-16 DIAGNOSIS — Z79899 Other long term (current) drug therapy: Secondary | ICD-10-CM | POA: Insufficient documentation

## 2016-07-16 DIAGNOSIS — R7303 Prediabetes: Secondary | ICD-10-CM | POA: Insufficient documentation

## 2016-07-16 DIAGNOSIS — M5440 Lumbago with sciatica, unspecified side: Secondary | ICD-10-CM | POA: Insufficient documentation

## 2016-07-16 DIAGNOSIS — Z885 Allergy status to narcotic agent status: Secondary | ICD-10-CM | POA: Insufficient documentation

## 2016-07-16 DIAGNOSIS — K219 Gastro-esophageal reflux disease without esophagitis: Secondary | ICD-10-CM | POA: Insufficient documentation

## 2016-07-16 DIAGNOSIS — Z9889 Other specified postprocedural states: Secondary | ICD-10-CM | POA: Insufficient documentation

## 2016-07-16 DIAGNOSIS — R51 Headache: Secondary | ICD-10-CM | POA: Insufficient documentation

## 2016-07-16 DIAGNOSIS — E7800 Pure hypercholesterolemia, unspecified: Secondary | ICD-10-CM | POA: Insufficient documentation

## 2016-07-16 DIAGNOSIS — Z7984 Long term (current) use of oral hypoglycemic drugs: Secondary | ICD-10-CM | POA: Insufficient documentation

## 2016-07-16 MED ORDER — ONDANSETRON HCL 4 MG PO TABS: 4.0000 mg | ORAL_TABLET | Freq: Three times a day (TID) | ORAL | 0 refills | Status: DC | PRN

## 2016-07-16 MED ORDER — ATORVASTATIN CALCIUM 20 MG PO TABS: 20.0000 mg | ORAL_TABLET | Freq: Every day | ORAL | 3 refills | Status: DC

## 2016-07-16 MED ORDER — CYCLOBENZAPRINE HCL 10 MG PO TABS
ORAL_TABLET | ORAL | 3 refills | Status: DC
Start: 2016-07-16 — End: 2016-10-19

## 2016-07-16 MED FILL — ?CYCLOBENZAPRINE 10 MG TABL: 10 | 20 days supply | Qty: 60 | Fill #0

## 2016-07-16 MED FILL — ?ATORVASTATIN 20 MG TABLET: 20 | 30 days supply | Qty: 30 | Fill #0

## 2016-07-16 NOTE — Progress Notes (Signed)
Subjective:  Patient ID: Cheryl King, female    DOB: 1965-05-08  Age: 51 y.o. MRN: [id number]  CC: Headache   HPI Cheryl King is 51 year old female who presents to establish care with me as she was previously followed by Dr. Janne Napoleon. Medical history is significant for chronic low back pain with sciatica, GERD, hyperlipidemia.  At the time she made this appointment, one month ago she had headaches after she had taken a fall and hit her head but states headaches have resolved at this time. She denies blurry vision, memory impairments.  She is requesting refill of Flexeril which she takes for her low back pain in addition to tramadol and also like a prescription for Zofran a tramadol causes nausea.  She was diagnosed with prediabetes with an A1c of 5.9 from 04/2016 and was prescribed metformin which she has not been taking but has been working on a low-carb diet and exercise.  Past Medical History:  Diagnosis Date  . Acid reflux   . Carpal tunnel syndrome   . Diverticulitis   . Low back pain   . 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 Sciatica   . Seasonal allergies     Past Surgical History:  Procedure Laterality Date  . ABDOMINAL HYSTERECTOMY    . ABDOMINAL SURGERY    . ANKLE SURGERY     lt.  . right breast cyst removed     . uretherl     uretrral diverticultis    Allergies  Allergen Reactions  . Percocet [Oxycodone-Acetaminophen] Nausea Only  . Vicodin [Hydrocodone-Acetaminophen] Nausea Only  . Penicillins Rash     Outpatient Medications Prior to Visit  Medication Sig Dispense Refill  . amitriptyline (ELAVIL) 25 MG tablet TAKE 1 TABLET BY MOUTH 2 TIMES DAILY. 60 tablet 2  . azithromycin (ZITHROMAX) 250 MG tablet Take 1 tablet (250 mg total) by mouth daily. Take first 2 tablets together, then 1 every day until finished. 6 tablet 0  . benzonatate (TESSALON) 100 MG capsule Take 1 capsule (100 mg total) by mouth every 8 (eight) hours. 21 capsule 0  .  diclofenac (VOLTAREN) 50 MG EC tablet TAKE 1 TABLET BY MOUTH 2 TO 3 TIMES DAILY WITH FOOD AS NEEDED FOR INFLAMMATION 90 tablet 3  . diclofenac sodium (VOLTAREN) 1 % GEL Apply 2 g topically 4 (four) times daily.    . Lactobacillus CAPS Take 1 capsule by mouth daily.    . Multiple Vitamin (MULTIVITAMIN WITH MINERALS) TABS tablet Take 1 tablet by mouth daily.    . pregabalin (LYRICA) 50 MG capsule Take 1 capsule (50 mg total) by mouth at bedtime. 30 capsule 3  . ranitidine (ZANTAC) 150 MG tablet TAKE 1 TABLET BY MOUTH 2 TIMES DAILY. 60 tablet 3  . traMADol (ULTRAM) 50 MG tablet Take 50 mg by mouth every 6 (six) hours as needed.    Marland Kitchen atorvastatin (LIPITOR) 20 MG tablet TAKE 1 TABLET BY MOUTH DAILY 30 tablet 3  . cyclobenzaprine (FLEXERIL) 10 MG tablet TAKE 1 TABLET BY MOUTH 3 TIMES DAILY AS NEEDED FOR MUSCLE SPASMS. 55 tablet 0  . predniSONE (DELTASONE) 50 MG tablet Take 1 tablet daily with food 5 tablet 0  . gabapentin (NEURONTIN) 300 MG capsule Take 1 capsule (300 mg total) by mouth 3 (three) times daily. Take one tablet po qhs for 5 days then one tablet po BID for 5 days then one tablet po TID. (Patient not taking: Reported on 05/03/2016) 90 capsule 11  . metFORMIN (GLUCOPHAGE) 500  MG tablet Take 1 tablet (500 mg total) by mouth daily with breakfast. (Patient not taking: Reported on 07/16/2016) 90 tablet 3  . varenicline (CHANTIX CONTINUING MONTH PAK) 1 MG tablet Take 1 tablet (1 mg total) by mouth 2 (two) times daily. (Patient not taking: Reported on 05/06/2016) 56 tablet 0  . varenicline (CHANTIX STARTING MONTH PAK) 0.5 MG X 11 & 1 MG X 42 tablet Use as directed (Patient not taking: Reported on 05/06/2016) 53 tablet 0   No facility-administered medications prior to visit.     ROS Review of Systems  Constitutional: Negative for activity change, appetite change and fatigue.  HENT: Negative for congestion, sinus pressure and sore throat.   Eyes: Negative for visual disturbance.  Respiratory:  Negative for cough, chest tightness, shortness of breath and wheezing.   Cardiovascular: Negative for chest pain and palpitations.  Gastrointestinal: Negative for abdominal distention, abdominal pain and constipation.  Endocrine: Negative for polydipsia.  Genitourinary: Negative for dysuria and frequency.  Musculoskeletal: Negative for arthralgias and back pain.  Skin: Negative for rash.  Neurological: Negative for tremors, light-headedness and numbness.  Hematological: Does not bruise/bleed easily.  Psychiatric/Behavioral: Negative for agitation and behavioral problems.    Objective:  BP 105/67 (BP Location: Left Arm, Patient Position: Sitting, Cuff Size: Normal)   Pulse 72   Temp 98.3 F (36.8 C) (Oral)   Resp 18   Ht 5\' 7"  (1.702 m)   Wt 156 lb (70.8 kg)   SpO2 97%   BMI 24.43 kg/m   BP/Weight 07/16/2016 07/12/2016 3/64/6803  Systolic BP 212 93 248  Diastolic BP 67 71 70  Wt. (Lbs) 156 - 159.8  BMI 24.43 - 25.03      Physical Exam  Constitutional: She is oriented to person, place, and time. She appears well-developed and well-nourished.  HENT:  Right Ear: External ear normal.  Left Ear: External ear normal.  Mouth/Throat: Oropharynx is clear and moist.  Cardiovascular: Normal rate, normal heart sounds and intact distal pulses.   No murmur heard. Pulmonary/Chest: Effort normal and breath sounds normal. She has no wheezes. She has no rales. She exhibits no tenderness.  Abdominal: Soft. Bowel sounds are normal. She exhibits no distension and no mass. There is no tenderness.  Musculoskeletal: Normal range of motion.  Neurological: She is alert and oriented to person, place, and time.  Skin: Skin is warm and dry.  Psychiatric: She has a normal mood and affect.    Lipid Panel     Component Value Date/Time   CHOL 194 12/26/2013 1319   TRIG 89 12/26/2013 1319   HDL 60 12/26/2013 1319   CHOLHDL 3.2 12/26/2013 1319   VLDL 18 12/26/2013 1319   LDLCALC 116 (H) 12/26/2013  1319     Assessment & Plan:   1. Chronic midline low back pain with sciatica, sciatica laterality unspecified - cyclobenzaprine (FLEXERIL) 10 MG tablet; TAKE 1 TABLET BY MOUTH 3 TIMES DAILY AS NEEDED FOR MUSCLE SPASMS.  Dispense: 60 tablet; Refill: 3  2. Pure hypercholesterolemia - atorvastatin (LIPITOR) 20 MG tablet; Take 1 tablet (20 mg total) by mouth daily.  Dispense: 30 tablet; Refill: 3 - Lipid panel  I have provided a prescription for Zofran as per patient request due to the nauseating effect of tramadol.  Meds ordered this encounter  Medications  . atorvastatin (LIPITOR) 20 MG tablet    Sig: Take 1 tablet (20 mg total) by mouth daily.    Dispense:  30 tablet    Refill:  3  . cyclobenzaprine (FLEXERIL) 10 MG tablet    Sig: TAKE 1 TABLET BY MOUTH 3 TIMES DAILY AS NEEDED FOR MUSCLE SPASMS.    Dispense:  60 tablet    Refill:  3    Follow-up: Return in about 3 months (around 10/16/2016) for Follow-up on chronic medical conditions.   Arnoldo Morale MD

## 2016-07-16 NOTE — Patient Instructions (Signed)

## 2016-07-16 NOTE — Progress Notes (Signed)
Patient is here to reestablish for headaches.  Patient fell in MAY after slipping on a pair of long pants she was wearing. Patient hit her head and did not seek medical attention. Patient complains of intermittent Headaches occurring in the top of her head.  Patient has not taken medication today Patient has not eaten today.  Patient denies pain or headache at this time.

## 2016-07-17 LAB — LIPID PANEL
Chol/HDL Ratio: 3.2 ratio (ref 0.0–4.4)
Cholesterol, Total: 170 mg/dL (ref 100–199)
HDL: 53 mg/dL (ref >39)
LDL CALC: 102 mg/dL — AB (ref 0–99)
TRIGLYCERIDES: 74 mg/dL (ref 0–149)
VLDL Cholesterol Cal: 15 mg/dL (ref 5–40)

## 2016-07-27 ENCOUNTER — Other Ambulatory Visit (INDEPENDENT_AMBULATORY_CARE_PROVIDER_SITE_OTHER): Payer: Self-pay | Admitting: Specialist

## 2016-07-27 DIAGNOSIS — R519 Headache, unspecified: Secondary | ICD-10-CM

## 2016-07-27 DIAGNOSIS — M4722 Other spondylosis with radiculopathy, cervical region: Secondary | ICD-10-CM

## 2016-07-27 DIAGNOSIS — G5621 Lesion of ulnar nerve, right upper limb: Secondary | ICD-10-CM

## 2016-07-27 DIAGNOSIS — R51 Headache: Secondary | ICD-10-CM

## 2016-07-27 MED FILL — ?CYCLOBENZAPRINE 10 MG TABL: 10 | 20 days supply | Qty: 60 | Fill #1

## 2016-07-27 MED FILL — raNITIdine HCL 150 MG TABS: 150 | 30 days supply | Qty: 60 | Fill #2

## 2016-07-27 NOTE — Telephone Encounter (Signed)
Can you please advise?

## 2016-07-28 NOTE — Telephone Encounter (Signed)
Patient should be having this medication prescribed by her primary care physician. Have her contact [facility] practitioner.  Please see note Dr. Louanne Skye put in the chart 04/28/2016 and I discussed regarding her speaking with primary care physician and possible drug interactions

## 2016-07-29 MED FILL — AMITRIPTYLINE HCL 25 MG TAB: 25 | 30 days supply | Qty: 30 | Fill #5

## 2016-07-29 MED FILL — ?ONDANSETRON HCL 4 MG TABLE: 4 | 6 days supply | Qty: 20 | Fill #0

## 2016-08-16 MED FILL — DICLOFENAC SOD EC 50 MG TAB: 50 | 30 days supply | Qty: 90 | Fill #3

## 2016-08-16 MED FILL — ATORVASTATIN 20 MG TABLET: 20 | 30 days supply | Qty: 30 | Fill #1

## 2016-08-18 ENCOUNTER — Telehealth: Payer: Self-pay | Admitting: Family Medicine

## 2016-08-18 NOTE — Telephone Encounter (Signed)
Pt called to request a new med since her Tramadol in not working, please call her back to see what you can order for her

## 2016-08-19 NOTE — Telephone Encounter (Signed)
This will have to be addressed at an office visit

## 2016-08-20 ENCOUNTER — Encounter: Payer: Self-pay | Admitting: Family Medicine

## 2016-08-20 ENCOUNTER — Ambulatory Visit: Payer: Medicaid Other | Attending: Family Medicine | Admitting: Family Medicine

## 2016-08-20 VITALS — BP 100/60 | HR 77 | Temp 98.7°F | Resp 18 | Ht 67.0 in | Wt 156.0 lb

## 2016-08-20 DIAGNOSIS — G5600 Carpal tunnel syndrome, unspecified upper limb: Secondary | ICD-10-CM | POA: Diagnosis not present

## 2016-08-20 DIAGNOSIS — Z9889 Other specified postprocedural states: Secondary | ICD-10-CM | POA: Diagnosis not present

## 2016-08-20 DIAGNOSIS — E785 Hyperlipidemia, unspecified: Secondary | ICD-10-CM | POA: Diagnosis not present

## 2016-08-20 DIAGNOSIS — Z880 Allergy status to penicillin: Secondary | ICD-10-CM | POA: Diagnosis not present

## 2016-08-20 DIAGNOSIS — M5440 Lumbago with sciatica, unspecified side: Secondary | ICD-10-CM | POA: Diagnosis not present

## 2016-08-20 DIAGNOSIS — K219 Gastro-esophageal reflux disease without esophagitis: Secondary | ICD-10-CM | POA: Diagnosis not present

## 2016-08-20 DIAGNOSIS — G8929 Other chronic pain: Secondary | ICD-10-CM

## 2016-08-20 DIAGNOSIS — Z885 Allergy status to narcotic agent status: Secondary | ICD-10-CM | POA: Insufficient documentation

## 2016-08-20 DIAGNOSIS — Z90710 Acquired absence of both cervix and uterus: Secondary | ICD-10-CM | POA: Diagnosis not present

## 2016-08-20 MED ORDER — TRAMADOL HCL 50 MG PO TABS: 50.0000 mg | ORAL_TABLET | Freq: Three times a day (TID) | ORAL | 1 refills | Status: DC | PRN

## 2016-08-20 MED ORDER — DULOXETINE HCL 60 MG PO CPEP: 60.0000 mg | ORAL_CAPSULE | Freq: Every day | ORAL | 3 refills | Status: DC

## 2016-08-20 MED FILL — ?DULOXETINE HCL DR 60 MG CA: 60 MG | 30 days supply | Qty: 30 | Fill #0

## 2016-08-20 MED FILL — traMADol HCL 50 MG TABS: 50 | 30 days supply | Qty: 90 | Fill #0

## 2016-08-20 NOTE — Progress Notes (Signed)
Subjective:  Patient ID: Cheryl King, female    DOB: 02-08-1965  Age: 51 y.o. MRN: [id number]  CC: Medication Management   HPI Cheryl King  is 51 year old female with Medical history  significant for chronic low back pain with sciatica, GERD, hyperlipidemia Who presents today due to back pain not being controlled on tramadol and Flexeril.  Her orthopedic is Dr.Nitka whom she has not seen in a while but informs me that surgery was not recommended by orthopedics for management of her back pain. Pain occurs in the lateral aspects of her lumbar spine and shoots down her right leg mostly. Has intermittent numbness in her legs but no loss of sphincteric function and no recent falls.  Past Medical History:  Diagnosis Date  . Acid reflux   . Carpal tunnel syndrome   . Diverticulitis   . Low back pain   . 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 Sciatica   . Seasonal allergies     Past Surgical History:  Procedure Laterality Date  . ABDOMINAL HYSTERECTOMY    . ABDOMINAL SURGERY    . ANKLE SURGERY     lt.  . right breast cyst removed     . uretherl     uretrral diverticultis    Allergies  Allergen Reactions  . Percocet [Oxycodone-Acetaminophen] Nausea Only  . Vicodin [Hydrocodone-Acetaminophen] Nausea Only  . Penicillins Rash     Outpatient Medications Prior to Visit  Medication Sig Dispense Refill  . amitriptyline (ELAVIL) 25 MG tablet TAKE 1 TABLET BY MOUTH 2 TIMES DAILY. 60 tablet 2  . atorvastatin (LIPITOR) 20 MG tablet Take 1 tablet (20 mg total) by mouth daily. 30 tablet 3  . cyclobenzaprine (FLEXERIL) 10 MG tablet TAKE 1 TABLET BY MOUTH 3 TIMES DAILY AS NEEDED FOR MUSCLE SPASMS. 60 tablet 3  . diclofenac (VOLTAREN) 50 MG EC tablet TAKE 1 TABLET BY MOUTH 2 TO 3 TIMES DAILY WITH FOOD AS NEEDED FOR INFLAMMATION 90 tablet 3  . diclofenac sodium (VOLTAREN) 1 % GEL Apply 2 g topically 4 (four) times daily.    . Lactobacillus CAPS Take 1 capsule by mouth daily.    .  Multiple Vitamin (MULTIVITAMIN WITH MINERALS) TABS tablet Take 1 tablet by mouth daily.    . ondansetron (ZOFRAN) 4 MG tablet Take 1 tablet (4 mg total) by mouth every 8 (eight) hours as needed for nausea or vomiting. 20 tablet 0  . predniSONE (DELTASONE) 50 MG tablet Take 1 tablet daily with food 5 tablet 0  . pregabalin (LYRICA) 50 MG capsule Take 1 capsule (50 mg total) by mouth at bedtime. 30 capsule 3  . ranitidine (ZANTAC) 150 MG tablet TAKE 1 TABLET BY MOUTH 2 TIMES DAILY. 60 tablet 3  . azithromycin (ZITHROMAX) 250 MG tablet Take 1 tablet (250 mg total) by mouth daily. Take first 2 tablets together, then 1 every day until finished. 6 tablet 0  . benzonatate (TESSALON) 100 MG capsule Take 1 capsule (100 mg total) by mouth every 8 (eight) hours. 21 capsule 0  . traMADol (ULTRAM) 50 MG tablet Take 50 mg by mouth every 6 (six) hours as needed.     No facility-administered medications prior to visit.     ROS Review of Systems Constitutional: Negative for activity change, appetite change and fatigue.  HENT: Negative for congestion, sinus pressure and sore throat.   Eyes: Negative for visual disturbance.  Respiratory: Negative for cough, chest tightness, shortness of breath and wheezing.   Cardiovascular: Negative for chest  pain and palpitations.  Gastrointestinal: Negative for abdominal distention, abdominal pain and constipation.  Endocrine: Negative for polydipsia.  Genitourinary: Negative for dysuria and frequency.  Musculoskeletal: Negative for arthralgias and positive for back pain.  Skin: Negative for rash.  Neurological: Negative for tremors, light-headedness and numbness.  Hematological: Does not bruise/bleed easily.  Psychiatric/Behavioral: Negative for agitation and behavioral problems.   Objective:  BP 100/60 (BP Location: Left Arm, Patient Position: Sitting, Cuff Size: Normal)   Pulse 77   Temp 98.7 F (37.1 C) (Oral)   Resp 18   Ht 5\' 7"  (1.702 m)   Wt 156 lb (70.8  kg)   SpO2 98%   BMI 24.43 kg/m   BP/Weight 08/20/2016 07/16/2016 0/62/6948  Systolic BP 546 270 93  Diastolic BP 60 67 71  Wt. (Lbs) 156 156 -  BMI 24.43 24.43 -      Physical Exam  Constitutional: She is oriented to person, place, and time. She appears well-developed and well-nourished.  Cardiovascular: Normal rate, normal heart sounds and intact distal pulses.   No murmur heard. Pulmonary/Chest: Effort normal and breath sounds normal. She has no wheezes. She has no rales. She exhibits no tenderness.  Abdominal: Soft. Bowel sounds are normal. She exhibits no distension and no mass. There is no tenderness.  Musculoskeletal: She exhibits tenderness (tenderness on palpation of bilateral lumbar spine. Positive straight leg raise on the right.).  Neurological: She is alert and oriented to person, place, and time.     Assessment & Plan:   1. Chronic midline low back pain with sciatica, sciatica laterality unspecified Uncontrolled Increased frequency of tramadol from twice daily to 3 times daily dosing Cymbalta added to her regimen Apply heat - DULoxetine (CYMBALTA) 60 MG capsule; Take 1 capsule (60 mg total) by mouth daily.  Dispense: 30 capsule; Refill: 3 - traMADol (ULTRAM) 50 MG tablet; Take 1 tablet (50 mg total) by mouth every 8 (eight) hours as needed.  Dispense: 90 tablet; Refill: 1   Meds ordered this encounter  Medications  . DULoxetine (CYMBALTA) 60 MG capsule    Sig: Take 1 capsule (60 mg total) by mouth daily.    Dispense:  30 capsule    Refill:  3  . traMADol (ULTRAM) 50 MG tablet    Sig: Take 1 tablet (50 mg total) by mouth every 8 (eight) hours as needed.    Dispense:  90 tablet    Refill:  1    Follow-up: Return for follow up of medical conditions, keep previously scheduled appointment.   This note has been created with Surveyor, quantity. Any transcriptional errors are unintentional.     Arnoldo Morale MD

## 2016-08-20 NOTE — Patient Instructions (Signed)
Sciatica Sciatica is pain, numbness, weakness, or tingling along the path of the sciatic nerve. The sciatic nerve starts in the lower back and runs down the back of each leg. The nerve controls the muscles in the lower leg and in the back of the knee. It also provides feeling (sensation) to the back of the thigh, the lower leg, and the sole of the foot. Sciatica is a symptom of another medical condition that pinches or puts pressure on the sciatic nerve. Generally, sciatica only affects one side of the body. Sciatica usually goes away on its own or with treatment. In some cases, sciatica may keep coming back (recur). What are the causes? This condition is caused by pressure on the sciatic nerve, or pinching of the sciatic nerve. This may be the result of:  A disk in between the bones of the spine (vertebrae) bulging out too far (herniated disk).  Age-related changes in the spinal disks (degenerative disk disease).  A pain disorder that affects a muscle in the buttock (piriformis syndrome).  Extra bone growth (bone spur) near the sciatic nerve.  An injury or break (fracture) of the pelvis.  Pregnancy.  Tumor (rare). What increases the risk? The following factors may make you more likely to develop this condition:  Playing sports that place pressure or stress on the spine, such as football or weight lifting.  Having poor strength and flexibility.  A history of back injury.  A history of back surgery.  Sitting for long periods of time.  Doing activities that involve repetitive bending or lifting.  Obesity. What are the signs or symptoms? Symptoms can vary from mild to very severe, and they may include:  Any of these problems in the lower back, leg, hip, or buttock:  Mild tingling or dull aches.  Burning sensations.  Sharp pains.  Numbness in the back of the calf or the sole of the foot.  Leg weakness.  Severe back pain that makes movement difficult. These symptoms may  get worse when you cough, sneeze, or laugh, or when you sit or stand for long periods of time. Being overweight may also make symptoms worse. In some cases, symptoms may recur over time. How is this diagnosed? This condition may be diagnosed based on:  Your symptoms.  A physical exam. Your health care provider may ask you to do certain movements to check whether those movements trigger your symptoms.  You may have tests, including:  Blood tests.  X-rays.  MRI.  CT scan. How is this treated? In many cases, this condition improves on its own, without any treatment. However, treatment may include:  Reducing or modifying physical activity during periods of pain.  Exercising and stretching to strengthen your abdomen and improve the flexibility of your spine.  Icing and applying heat to the affected area.  Medicines that help:  To relieve pain and swelling.  To relax your muscles.  Injections of medicines that help to relieve pain, irritation, and inflammation around the sciatic nerve (steroids).  Surgery. Follow these instructions at home: Medicines   Take over-the-counter and prescription medicines only as told by your health care provider.  Do not drive or operate heavy machinery while taking prescription pain medicine. Managing pain   If directed, apply ice to the affected area.  Put ice in a plastic bag.  Place a towel between your skin and the bag.  Leave the ice on for 20 minutes, 2-3 times a day.  After icing, apply heat to the   affected area before you exercise or as often as told by your health care provider. Use the heat source that your health care provider recommends, such as a moist heat pack or a heating pad.  Place a towel between your skin and the heat source.  Leave the heat on for 20-30 minutes.  Remove the heat if your skin turns bright red. This is especially important if you are unable to feel pain, heat, or cold. You may have a greater risk of  getting burned. Activity   Return to your normal activities as told by your health care provider. Ask your health care provider what activities are safe for you.  Avoid activities that make your symptoms worse.  Take brief periods of rest throughout the day. Resting in a lying or standing position is usually better than sitting to rest.  When you rest for longer periods, mix in some mild activity or stretching between periods of rest. This will help to prevent stiffness and pain.  Avoid sitting for long periods of time without moving. Get up and move around at least one time each hour.  Exercise and stretch regularly, as told by your health care provider.  Do not lift anything that is heavier than 10 lb (4.5 kg) while you have symptoms of sciatica. When you do not have symptoms, you should still avoid heavy lifting, especially repetitive heavy lifting.  When you lift objects, always use proper lifting technique, which includes:  Bending your knees.  Keeping the load close to your body.  Avoiding twisting. General instructions   Use good posture.  Avoid leaning forward while sitting.  Avoid hunching over while standing.  Maintain a healthy weight. Excess weight puts extra stress on your back and makes it difficult to maintain good posture.  Wear supportive, comfortable shoes. Avoid wearing high heels.  Avoid sleeping on a mattress that is too soft or too hard. A mattress that is firm enough to support your back when you sleep may help to reduce your pain.  Keep all follow-up visits as told by your health care provider. This is important. Contact a health care provider if:  You have pain that wakes you up when you are sleeping.  You have pain that gets worse when you lie down.  Your pain is worse than you have experienced in the past.  Your pain lasts longer than 4 weeks.  You experience unexplained weight loss. Get help right away if:  You lose control of your bowel  or bladder (incontinence).  You have:  Weakness in your lower back, pelvis, buttocks, or legs that gets worse.  Redness or swelling of your back.  A burning sensation when you urinate. This information is not intended to replace advice given to you by your health care provider. Make sure you discuss any questions you have with your health care provider. Document Released: 12/29/2000 Document Revised: 06/10/2015 Document Reviewed: 09/13/2014 Elsevier Interactive Patient Education  2017 [employer]  

## 2016-08-25 ENCOUNTER — Ambulatory Visit: Payer: Self-pay | Attending: Internal Medicine

## 2016-08-25 MED FILL — AMITRIPTYLINE HCL 25 MG TAB: 25 | 30 days supply | Qty: 30 | Fill #6

## 2016-08-30 MED FILL — ?CYCLOBENZAPRINE 10 MG TABL: 10 | 20 days supply | Qty: 60 | Fill #2

## 2016-08-30 MED FILL — raNITIdine HCL 150 MG TABS: 150 | 30 days supply | Qty: 60 | Fill #3

## 2016-08-31 ENCOUNTER — Other Ambulatory Visit: Payer: Self-pay | Admitting: Pharmacist

## 2016-08-31 MED ORDER — RANITIDINE HCL 150 MG PO TABS: 150.0000 mg | ORAL_TABLET | Freq: Two times a day (BID) | ORAL | 3 refills | Status: DC

## 2016-09-21 MED FILL — ATORVASTATIN 20 MG TABLET: 20 | 30 days supply | Qty: 30 | Fill #2

## 2016-09-27 ENCOUNTER — Other Ambulatory Visit (INDEPENDENT_AMBULATORY_CARE_PROVIDER_SITE_OTHER): Payer: Self-pay | Admitting: Specialist

## 2016-09-27 ENCOUNTER — Other Ambulatory Visit: Payer: Self-pay | Admitting: Family Medicine

## 2016-09-27 DIAGNOSIS — M5440 Lumbago with sciatica, unspecified side: Principal | ICD-10-CM

## 2016-09-27 DIAGNOSIS — G8929 Other chronic pain: Secondary | ICD-10-CM

## 2016-09-27 MED FILL — DICLOFENAC SOD EC 50 MG TAB: 50 | 30 days supply | Qty: 90 | Fill #0

## 2016-09-27 MED FILL — AMITRIPTYLINE HCL 25 MG TAB: 25 | 30 days supply | Qty: 30 | Fill #7

## 2016-09-27 MED FILL — ?CYCLOBENZAPRINE 10 MG TABL: 10 | 20 days supply | Qty: 60 | Fill #3

## 2016-09-27 NOTE — Telephone Encounter (Signed)
Diclofenac refill request 

## 2016-10-05 MED FILL — raNITIdine HCL 150 MG TABS: 150 | 30 days supply | Qty: 60 | Fill #0

## 2016-10-08 ENCOUNTER — Other Ambulatory Visit: Payer: Self-pay | Admitting: Family Medicine

## 2016-10-11 ENCOUNTER — Other Ambulatory Visit: Payer: Self-pay

## 2016-10-11 MED ORDER — PREGABALIN 50 MG PO CAPS: 50.0000 mg | ORAL_CAPSULE | Freq: Every day | ORAL | 3 refills | Status: DC

## 2016-10-19 ENCOUNTER — Encounter: Payer: Self-pay | Admitting: Family Medicine

## 2016-10-19 ENCOUNTER — Ambulatory Visit: Payer: Medicaid Other | Attending: Family Medicine | Admitting: Family Medicine

## 2016-10-19 VITALS — BP 92/60 | HR 78 | Temp 98.5°F | Resp 18 | Ht 67.0 in | Wt 152.0 lb

## 2016-10-19 DIAGNOSIS — Z880 Allergy status to penicillin: Secondary | ICD-10-CM | POA: Insufficient documentation

## 2016-10-19 DIAGNOSIS — M5440 Lumbago with sciatica, unspecified side: Secondary | ICD-10-CM

## 2016-10-19 DIAGNOSIS — R52 Pain, unspecified: Secondary | ICD-10-CM | POA: Diagnosis present

## 2016-10-19 DIAGNOSIS — Z23 Encounter for immunization: Secondary | ICD-10-CM | POA: Diagnosis not present

## 2016-10-19 DIAGNOSIS — R7303 Prediabetes: Secondary | ICD-10-CM | POA: Diagnosis not present

## 2016-10-19 DIAGNOSIS — K219 Gastro-esophageal reflux disease without esophagitis: Secondary | ICD-10-CM | POA: Diagnosis not present

## 2016-10-19 DIAGNOSIS — Z79899 Other long term (current) drug therapy: Secondary | ICD-10-CM | POA: Diagnosis not present

## 2016-10-19 DIAGNOSIS — R0982 Postnasal drip: Secondary | ICD-10-CM

## 2016-10-19 DIAGNOSIS — G8929 Other chronic pain: Secondary | ICD-10-CM | POA: Diagnosis not present

## 2016-10-19 DIAGNOSIS — E7800 Pure hypercholesterolemia, unspecified: Secondary | ICD-10-CM

## 2016-10-19 DIAGNOSIS — Z885 Allergy status to narcotic agent status: Secondary | ICD-10-CM | POA: Diagnosis not present

## 2016-10-19 LAB — POCT GLYCOSYLATED HEMOGLOBIN (HGB A1C): Hemoglobin A1C: 5.8

## 2016-10-19 MED ORDER — DULOXETINE HCL 60 MG PO CPEP: 60.0000 mg | ORAL_CAPSULE | Freq: Every day | ORAL | 1 refills | Status: DC

## 2016-10-19 MED ORDER — ATORVASTATIN CALCIUM 20 MG PO TABS: 20.0000 mg | ORAL_TABLET | Freq: Every day | ORAL | 1 refills | Status: DC

## 2016-10-19 MED ORDER — CETIRIZINE HCL 10 MG PO TABS: 10.0000 mg | ORAL_TABLET | Freq: Every day | ORAL | 1 refills | Status: DC

## 2016-10-19 MED ORDER — DICLOFENAC SODIUM 50 MG PO TBEC: 50.0000 mg | DELAYED_RELEASE_TABLET | Freq: Two times a day (BID) | ORAL | 1 refills | Status: DC

## 2016-10-19 MED ORDER — RANITIDINE HCL 150 MG PO TABS: 150.0000 mg | ORAL_TABLET | Freq: Two times a day (BID) | ORAL | 1 refills | Status: DC

## 2016-10-19 MED ORDER — CYCLOBENZAPRINE HCL 10 MG PO TABS: ORAL_TABLET | ORAL | 1 refills | Status: DC

## 2016-10-19 MED ORDER — TRAMADOL HCL 50 MG PO TABS: 50.0000 mg | ORAL_TABLET | Freq: Three times a day (TID) | ORAL | 1 refills | Status: DC | PRN

## 2016-10-19 MED ORDER — ONDANSETRON HCL 4 MG PO TABS: ORAL_TABLET | ORAL | 1 refills | Status: DC

## 2016-10-19 MED FILL — ?CETIRIZINE HCL 10 MG TABLE: 10 | 30 days supply | Qty: 30 | Fill #0

## 2016-10-19 MED FILL — raNITIdine HCL 150 MG TABS: 150 | 30 days supply | Qty: 60 | Fill #0

## 2016-10-19 MED FILL — ?ONDANSETRON HCL 4MG TABLET: 4 | 20 days supply | Qty: 60 | Fill #0

## 2016-10-19 MED FILL — ?ATORVASTATIN 20 MG TABLET: 20 | 30 days supply | Qty: 30 | Fill #0

## 2016-10-19 MED FILL — DICLOFENAC SOD EC 50 MG TAB: 50 | 30 days supply | Qty: 60 | Fill #0

## 2016-10-19 MED FILL — ?CYCLOBENZAPRINE 10 MG TABL: 10 | 30 days supply | Qty: 90 | Fill #0

## 2016-10-19 MED FILL — ?DULOXETINE HCL DR 60 MG CA: 60 MG | 30 days supply | Qty: 30 | Fill #0

## 2016-10-19 MED FILL — traMADol HCL 50 MG TABS: 50 | 30 days supply | Qty: 90 | Fill #0

## 2016-10-19 NOTE — Patient Instructions (Signed)

## 2016-10-19 NOTE — Progress Notes (Signed)
Subjective:  Patient ID: Cheryl King, female    DOB: 05-27-1965  Age: 51 y.o. MRN: [id number]  CC: Pain   HPI Cheryl King is 51 year old female with Medical history  significant for Prediabetes (A1c 5.8 from today-diet controlled) chronic low back pain with sciatica, GERD, hyperlipidemia here for a follow-up visit.  She endorses compliance with her medications and reports that her back pain is at an 8/10. Symptoms are stable on her current regimen and pain sometimes radiates down both lower extremities. She denies numbness in her legs, loss of sphincteric function. She did have a fall 4 months ago but none recently.  She tolerates her statin and denies myalgias. Reflux symptoms are controlled with no complaints of nausea or vomiting.  Complains of a one-month history of cough with postnasal drip but denies facial pain, rhinorrhea, fever or myalgias and has not used any OTC medications for fear of interactions with her prescription medications.  Past Medical History:  Diagnosis Date  . Acid reflux   . Carpal tunnel syndrome   . Diverticulitis   . Low back pain   . 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 Sciatica   . Seasonal allergies     Past Surgical History:  Procedure Laterality Date  . ABDOMINAL HYSTERECTOMY    . ABDOMINAL SURGERY    . ANKLE SURGERY     lt.  . right breast cyst removed     . uretherl     uretrral diverticultis    Allergies  Allergen Reactions  . Percocet [Oxycodone-Acetaminophen] Nausea Only  . Vicodin [Hydrocodone-Acetaminophen] Nausea Only  . Penicillins Rash     Outpatient Medications Prior to Visit  Medication Sig Dispense Refill  . amitriptyline (ELAVIL) 25 MG tablet TAKE 1 TABLET BY MOUTH 2 TIMES DAILY. 60 tablet 2  . diclofenac sodium (VOLTAREN) 1 % GEL Apply 2 g topically 4 (four) times daily.    . Lactobacillus CAPS Take 1 capsule by mouth daily.    . Multiple Vitamin (MULTIVITAMIN WITH MINERALS) TABS tablet Take 1 tablet by  mouth daily.    . pregabalin (LYRICA) 50 MG capsule Take 1 capsule (50 mg total) by mouth at bedtime. 90 capsule 3  . atorvastatin (LIPITOR) 20 MG tablet Take 1 tablet (20 mg total) by mouth daily. 30 tablet 3  . cyclobenzaprine (FLEXERIL) 10 MG tablet TAKE 1 TABLET BY MOUTH 3 TIMES DAILY AS NEEDED FOR MUSCLE SPASMS. 60 tablet 3  . diclofenac (VOLTAREN) 50 MG EC tablet TAKE 1 TABLET BY MOUTH 2 TO 3 TIMES DAILY WITH FOOD AS NEEDED FOR INFLAMMATION 90 tablet 3  . DULoxetine (CYMBALTA) 60 MG capsule Take 1 capsule (60 mg total) by mouth daily. 30 capsule 3  . ondansetron (ZOFRAN) 4 MG tablet TAKE 1 TABLET BY MOUTH EVERY 8 HOURS AS NEEDED FOR NAUSEA OR VOMITING. 20 tablet 0  . predniSONE (DELTASONE) 50 MG tablet Take 1 tablet daily with food 5 tablet 0  . ranitidine (ZANTAC) 150 MG tablet Take 1 tablet (150 mg total) by mouth 2 (two) times daily. 60 tablet 3  . traMADol (ULTRAM) 50 MG tablet Take 1 tablet (50 mg total) by mouth every 8 (eight) hours as needed. 90 tablet 1   No facility-administered medications prior to visit.     ROS Review of Systems  Constitutional: Negative for activity change, appetite change and fatigue.  HENT: Negative for congestion, sinus pressure and sore throat.   Eyes: Negative for visual disturbance.  Respiratory: Positive for cough. Negative for  chest tightness, shortness of breath and wheezing.   Cardiovascular: Negative for chest pain and palpitations.  Gastrointestinal: Negative for abdominal distention, abdominal pain and constipation.  Endocrine: Negative for polydipsia.  Genitourinary: Negative for dysuria and frequency.  Musculoskeletal: Positive for back pain. Negative for arthralgias.  Skin: Negative for rash.  Neurological: Negative for tremors, light-headedness and numbness.  Hematological: Does not bruise/bleed easily.  Psychiatric/Behavioral: Negative for agitation and behavioral problems.    Objective:  BP 92/60 (BP Location: Left Arm, Patient  Position: Sitting, Cuff Size: Normal)   Pulse 78   Temp 98.5 F (36.9 C) (Oral)   Resp 18   Ht 5\' 7"  (1.702 m)   Wt 152 lb (68.9 kg)   SpO2 100%   BMI 23.81 kg/m   BP/Weight 10/19/2016 08/20/2016 9/51/8841  Systolic BP 92 660 630  Diastolic BP 60 60 67  Wt. (Lbs) 152 156 156  BMI 23.81 24.43 24.43      Physical Exam  Constitutional: She is oriented to person, place, and time. She appears well-developed and well-nourished.  Cardiovascular: Normal rate, normal heart sounds and intact distal pulses.   No murmur heard. Pulmonary/Chest: Effort normal and breath sounds normal. She has no wheezes. She has no rales. She exhibits no tenderness.  Abdominal: Soft. Bowel sounds are normal. She exhibits no distension and no mass. There is no tenderness.  Musculoskeletal: Normal range of motion. She exhibits tenderness (slight tenderness on palpation of lumbar region;negative straight leg raise bilaterally). She exhibits no edema.  Neurological: She is alert and oriented to person, place, and time.    CMP Latest Ref Rng & Units 05/03/2016 04/13/2013 11/10/2011  Glucose 65 - 99 mg/dL 81 76 69(L)  BUN 6 - 24 mg/dL 18 18 17   Creatinine 0.57 - 1.00 mg/dL 0.74 0.74 0.68  Sodium 134 - 144 mmol/L 143 137 138  Potassium 3.5 - 5.2 mmol/L 4.9 4.3 3.7  Chloride 96 - 106 mmol/L 103 101 100  CO2 18 - 29 mmol/L 25 29 30   Calcium 8.7 - 10.2 mg/dL 9.8 10.0 9.2  Total Protein 6.0 - 8.3 g/dL - 7.3 7.1  Total Bilirubin 0.2 - 1.2 mg/dL - 0.4 0.3  Alkaline Phos 39 - 117 U/L - 75 63  AST 0 - 37 U/L - 21 17  ALT 0 - 35 U/L - 12 14    Lipid Panel     Component Value Date/Time   CHOL 170 07/16/2016 1001   TRIG 74 07/16/2016 1001   HDL 53 07/16/2016 1001   CHOLHDL 3.2 07/16/2016 1001   CHOLHDL 3.2 12/26/2013 1319   VLDL 18 12/26/2013 1319   LDLCALC 102 (H) 07/16/2016 1001    Lab Results  Component Value Date   HGBA1C 5.8 10/19/2016    Assessment & Plan:   1. Prediabetes Diet Controlled with A1c  of 5.8  2. Chronic midline low back pain with sciatica, sciatica laterality unspecified Stable No acute flares Keep appointment with orthopedics-Dr Louanne Skye - traMADol (ULTRAM) 50 MG tablet; Take 1 tablet (50 mg total) by mouth every 8 (eight) hours as needed.  Dispense: 90 tablet; Refill: 1 - DULoxetine (CYMBALTA) 60 MG capsule; Take 1 capsule (60 mg total) by mouth daily.  Dispense: 90 capsule; Refill: 1 - cyclobenzaprine (FLEXERIL) 10 MG tablet; TAKE 1 TABLET BY MOUTH 3 TIMES DAILY AS NEEDED FOR MUSCLE SPASMS.  Dispense: 90 tablet; Refill: 1  3. Pure hypercholesterolemia Controlled - atorvastatin (LIPITOR) 20 MG tablet; Take 1 tablet (20 mg total) by  mouth daily.  Dispense: 90 tablet; Refill: 1  4. Post-nasal drip Could explain cough - cetirizine (ZYRTEC) 10 MG tablet; Take 1 tablet (10 mg total) by mouth daily.  Dispense: 30 tablet; Refill: 1  5. Need for influenza vaccination Flu shot administered  6. Gastroesophageal reflux disease without esophagitis Stable - ranitidine (ZANTAC) 150 MG tablet; Take 1 tablet (150 mg total) by mouth 2 (two) times daily.  Dispense: 180 tablet; Refill: 1 - ondansetron (ZOFRAN) 4 MG tablet; TAKE 1 TABLET BY MOUTH EVERY 8 HOURS AS NEEDED FOR NAUSEA OR VOMITING.  Dispense: 60 tablet; Refill: 1   Meds ordered this encounter  Medications  . traMADol (ULTRAM) 50 MG tablet    Sig: Take 1 tablet (50 mg total) by mouth every 8 (eight) hours as needed.    Dispense:  90 tablet    Refill:  1  . DULoxetine (CYMBALTA) 60 MG capsule    Sig: Take 1 capsule (60 mg total) by mouth daily.    Dispense:  90 capsule    Refill:  1    90 day supply  . atorvastatin (LIPITOR) 20 MG tablet    Sig: Take 1 tablet (20 mg total) by mouth daily.    Dispense:  90 tablet    Refill:  1    90 day supply  . ranitidine (ZANTAC) 150 MG tablet    Sig: Take 1 tablet (150 mg total) by mouth 2 (two) times daily.    Dispense:  180 tablet    Refill:  1    90 day supply  .  cyclobenzaprine (FLEXERIL) 10 MG tablet    Sig: TAKE 1 TABLET BY MOUTH 3 TIMES DAILY AS NEEDED FOR MUSCLE SPASMS.    Dispense:  90 tablet    Refill:  1    90 day supply  . diclofenac (VOLTAREN) 50 MG EC tablet    Sig: Take 1 tablet (50 mg total) by mouth 2 (two) times daily.    Dispense:  180 tablet    Refill:  1    90 day supply  . ondansetron (ZOFRAN) 4 MG tablet    Sig: TAKE 1 TABLET BY MOUTH EVERY 8 HOURS AS NEEDED FOR NAUSEA OR VOMITING.    Dispense:  60 tablet    Refill:  1    90 day supply  . cetirizine (ZYRTEC) 10 MG tablet    Sig: Take 1 tablet (10 mg total) by mouth daily.    Dispense:  30 tablet    Refill:  1    Follow-up: Return in about 3 months (around 01/19/2017) for Follow-up of chronic medical conditions.   Arnoldo Morale MD

## 2016-10-20 ENCOUNTER — Other Ambulatory Visit (INDEPENDENT_AMBULATORY_CARE_PROVIDER_SITE_OTHER): Payer: Self-pay | Admitting: Specialist

## 2016-10-20 DIAGNOSIS — G5621 Lesion of ulnar nerve, right upper limb: Secondary | ICD-10-CM

## 2016-10-20 DIAGNOSIS — R51 Headache: Secondary | ICD-10-CM

## 2016-10-20 DIAGNOSIS — G8929 Other chronic pain: Secondary | ICD-10-CM

## 2016-10-20 DIAGNOSIS — R519 Headache, unspecified: Secondary | ICD-10-CM

## 2016-10-20 DIAGNOSIS — M4722 Other spondylosis with radiculopathy, cervical region: Secondary | ICD-10-CM

## 2016-10-20 NOTE — Telephone Encounter (Signed)
Amitriptyline Refill request

## 2016-11-04 ENCOUNTER — Ambulatory Visit (INDEPENDENT_AMBULATORY_CARE_PROVIDER_SITE_OTHER): Payer: Self-pay

## 2016-11-04 ENCOUNTER — Encounter (INDEPENDENT_AMBULATORY_CARE_PROVIDER_SITE_OTHER): Payer: Self-pay | Admitting: Specialist

## 2016-11-04 ENCOUNTER — Ambulatory Visit (INDEPENDENT_AMBULATORY_CARE_PROVIDER_SITE_OTHER): Payer: No Typology Code available for payment source

## 2016-11-04 ENCOUNTER — Ambulatory Visit (INDEPENDENT_AMBULATORY_CARE_PROVIDER_SITE_OTHER): Payer: No Typology Code available for payment source | Admitting: Specialist

## 2016-11-04 VITALS — BP 93/62 | HR 71 | Ht 67.0 in | Wt 152.0 lb

## 2016-11-04 DIAGNOSIS — M542 Cervicalgia: Secondary | ICD-10-CM

## 2016-11-04 DIAGNOSIS — M545 Low back pain: Secondary | ICD-10-CM

## 2016-11-04 DIAGNOSIS — M4317 Spondylolisthesis, lumbosacral region: Secondary | ICD-10-CM

## 2016-11-04 DIAGNOSIS — M4722 Other spondylosis with radiculopathy, cervical region: Secondary | ICD-10-CM

## 2016-11-04 DIAGNOSIS — M5136 Other intervertebral disc degeneration, lumbar region: Secondary | ICD-10-CM

## 2016-11-04 MED ORDER — ACETAMINOPHEN-CODEINE #3 300-30 MG PO TABS: 1.0000 | ORAL_TABLET | Freq: Three times a day (TID) | ORAL | 0 refills | Status: DC | PRN

## 2016-11-04 NOTE — Progress Notes (Signed)
Office Visit Note   Patient: Cheryl King           Date of Birth: 1965-03-29           MRN: [id number] Visit Date: 11/04/2016              Requested by: Arnoldo Morale, MD [place], [place] [id number] PCP: Arnoldo Morale, MD   Assessment & Plan: Visit Diagnoses:  1. Low back pain, unspecified back pain laterality, unspecified chronicity, with sciatica presence unspecified   2. Cervicalgia     Plan: Avoid overhead lifting and overhead use of the arms. Do not lift greater than 5-10 lbs. Adjust head rest in vehicle to prevent hyperextension if rear ended. Take extra precautions to avoid falling, including use of a cane . Avoid bending, stooping and avoid lifting weights greater than 10 lbs. Avoid prolong standing and walking. Avoid frequent bending and stooping  No lifting greater than 5-10 lbs. May use ice or moist heat for pain. Weight loss is of benefit. Handicap license is approved. MRI of the lumbar and cervical spine ordered. She reports this will be done some time after the end of the year.   Follow-Up Instructions: No Follow-up on file.   Orders:  Orders Placed This Encounter  Procedures  . XR Lumbar Spine 2-3 Views  . XR Cervical Spine 2 or 3 views   No orders of the defined types were placed in this encounter.     Procedures: No procedures performed   Clinical Data: No additional findings.   Subjective: Chief Complaint  Patient presents with  . Neck - Pain  . Lower Back - Pain    51 year old right handed female, she was last seen in 12/2015. She is not working due to back pain and neck pain. She has pain in the right arm with numbness and tingling. Experiencing headaches, has not seen a specialist for her headaches.Her primary care is Dr. Carmin Richmond, at [facility]. 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 The pain in the right neck into the right arm is unchanged. Awakens  at night sometimes. AM with stiffness with difficulty turning her head not drive as much as she has in the past. No children, not caring for children, she has a small dog and is separated. Numbness into the thumb side of the right hand. She has difficulty with washing hair and using her arms over head. She feels weak in her right arm, not dropping items with the right arm uses both hand if she feels she might drop, she last fell in May, she only remembers being on the floor, she fell straight back. She is having bilateral leg pain numbness and weakness mainly the right side. Standing and walking is painful sometimes and it bothers her to sit also, pain is dependent on what is hurting If buttocks or legs she has pain in the legs that is as bad as the back and even if she had relief of back pain she would have difficulties. She has worked all her life give or take 2 years. She  Worked [employer] one year and several years at [employer]. She spends most of day at home, she will do some exercises given by [place] and by PT, and Dr.Ahmal gave exercises to do  Prior to getting up.      Review of Systems  Constitutional: Positive for activity change and unexpected  weight change.  HENT: Positive for rhinorrhea, sinus pain and sinus pressure. Negative for sore throat, tinnitus, trouble swallowing and voice change.   Eyes: Positive for visual disturbance. Negative for photophobia, pain, discharge, redness and itching.  Respiratory: Positive for shortness of breath. Negative for cough, wheezing and stridor.   Cardiovascular: Negative for chest pain, palpitations and leg swelling.  Gastrointestinal: Negative for abdominal distention, abdominal pain, diarrhea, nausea and vomiting.  Endocrine: Negative for cold intolerance and heat intolerance.  Genitourinary: Negative for difficulty urinating, dyspareunia, dysuria, enuresis, flank pain, frequency and hematuria.  Musculoskeletal: Positive for back pain, neck pain  and neck stiffness.  Skin: Negative for color change, pallor, rash and wound.  Allergic/Immunologic: Positive for environmental allergies.  Neurological: Positive for weakness and numbness.  Hematological: Negative for adenopathy. Does not bruise/bleed easily.  Psychiatric/Behavioral: Positive for suicidal ideas. Negative for agitation, behavioral problems, confusion, decreased concentration, dysphoric mood, hallucinations, self-injury and sleep disturbance. The patient is not nervous/anxious and is not hyperactive.      Objective: Vital Signs: BP 93/62 (BP Location: Right Arm, Patient Position: Sitting)   Pulse 71   Ht 5\' 7"  (1.702 m)   Wt 152 lb (68.9 kg)   BMI 23.81 kg/m   Physical Exam  Constitutional: She is oriented to person, place, and time. She appears well-developed and well-nourished.  HENT:  Head: Normocephalic and atraumatic.  Eyes: Pupils are equal, round, and reactive to light. EOM are normal. Right eye exhibits no discharge. Left eye exhibits no discharge.  Neck: Normal range of motion. Neck supple.  Pulmonary/Chest: Effort normal and breath sounds normal. She has no wheezes. She has no rales. She exhibits no tenderness.  Abdominal: Soft. Bowel sounds are normal.  Neurological: She is alert and oriented to person, place, and time.  Skin: Skin is warm and dry.  Psychiatric: She has a normal mood and affect. Her behavior is normal. Judgment and thought content normal.    Back Exam   Tenderness  The patient is experiencing tenderness in the cervical and lumbar.  Range of Motion  Extension: abnormal  Flexion: abnormal  Lateral Bend Right: normal  Lateral Bend Left: normal  Rotation Right: normal  Rotation Left: normal   Muscle Strength  Right Quadriceps:  4/5  Left Quadriceps:  4/5  Right Hamstrings:  4/5  Left Hamstrings:  4/5   Tests  Straight leg raise right: negative Straight leg raise left: negative  Reflexes  Patellar: normal Achilles:  normal Biceps: normal Babinski's sign: normal   Other  Toe Walk: normal Heel Walk: normal Sensation: normal Gait: normal  Erythema: no back redness Scars: absent      Specialty Comments:  No specialty comments available.  Imaging: No results found.   PMFS History: Patient Active Problem List   Diagnosis Date Noted  . GERD (gastroesophageal reflux disease) 10/19/2016  . Prediabetes 05/03/2016  . Cubital tunnel syndrome on right 03/01/2016  . Numbness and tingling of right arm 01/01/2016  . Chlamydia infection 01/27/2015  . Smoking 12/26/2013  . Chronic lower back pain 04/13/2013  . Sciatica 04/13/2013  . Chronic neck pain 04/13/2013  . Spondylosis of cervical spine 04/13/2013   Past Medical History:  Diagnosis Date  . Acid reflux   . Carpal tunnel syndrome   . Diverticulitis   . Low back pain   . Sacroiliac inflammation ([place])   . Sciatica   . Seasonal allergies     Family History  Problem Relation Age of Onset  . Healthy Mother   .  Other Father        Unsure of medical history  . Asthma Maternal Aunt   . Diabetes Maternal Aunt   . Cancer Maternal Aunt   . Breast cancer Maternal Aunt   . Hypertension Maternal Grandmother   . Colon cancer Neg Hx   . Pancreatic cancer Neg Hx   . Rectal cancer Neg Hx   . Stomach cancer Neg Hx     Past Surgical History:  Procedure Laterality Date  . ABDOMINAL HYSTERECTOMY    . ABDOMINAL SURGERY    . ANKLE SURGERY     lt.  . right breast cyst removed     . uretherl     uretrral diverticultis   Social History   Occupational History  . Unemployed    Social History Main Topics  . Smoking status: Current Every Day Smoker    Packs/day: 0.50    Years: 35.00  . Smokeless tobacco: Never Used  . Alcohol use Yes     Comment: Social only - seldom  . Drug use: No  . Sexual activity: No

## 2016-11-04 NOTE — Patient Instructions (Signed)
Avoid overhead lifting and overhead use of the arms. Do not lift greater than 5-10 lbs. Adjust head rest in vehicle to prevent hyperextension if rear ended. Take extra precautions to avoid falling, including use of a cane . Avoid bending, stooping and avoid lifting weights greater than 10 lbs. Avoid prolong standing and walking. Avoid frequent bending and stooping  No lifting greater than 5-10 lbs. May use ice or moist heat for pain. Weight loss is of benefit. Handicap license is approved. MRI of the lumbar and cervical spine ordered. She reports this will be done some time after the end of the year.

## 2016-12-21 MED FILL — ATORVASTATIN 20 MG TABLET: 20 | 30 days supply | Qty: 30 | Fill #1

## 2016-12-21 MED FILL — ?ONDANSETRON HCL 4MG TABLET: 4 | 20 days supply | Qty: 60 | Fill #1

## 2016-12-21 MED FILL — raNITIdine HCL 150 MG TABS: 150 | 30 days supply | Qty: 60 | Fill #1

## 2016-12-21 MED FILL — ?CETIRIZINE HCL 10 MG TABLE: 10 | 30 days supply | Qty: 30 | Fill #1

## 2016-12-21 MED FILL — ACETAMINOPHEN/COD #3 TABLET: 300-30 | 10 days supply | Qty: 30 | Fill #0

## 2016-12-21 MED FILL — ?DULOXETINE HCL DR 60 MG CA: 60 MG | 30 days supply | Qty: 30 | Fill #1

## 2016-12-21 MED FILL — ?DICLOFENAC SOD DR 50 MG TA: 50 | 30 days supply | Qty: 60 | Fill #1

## 2016-12-21 MED FILL — ?CYCLOBENZAPRINE 10 MG TABL: 10 | 30 days supply | Qty: 90 | Fill #1

## 2016-12-30 ENCOUNTER — Inpatient Hospital Stay: Admission: RE | Admit: 2016-12-30 | Payer: Self-pay | Source: Ambulatory Visit

## 2016-12-30 ENCOUNTER — Other Ambulatory Visit: Payer: Self-pay

## 2017-01-04 ENCOUNTER — Ambulatory Visit
Admission: RE | Admit: 2017-01-04 | Discharge: 2017-01-04 | Disposition: A | Discharge status: Home / Self Care | Payer: Medicaid Other | Source: Ambulatory Visit | Attending: Specialist | Admitting: Specialist

## 2017-01-04 DIAGNOSIS — M542 Cervicalgia: Secondary | ICD-10-CM

## 2017-01-04 DIAGNOSIS — M5136 Other intervertebral disc degeneration, lumbar region: Secondary | ICD-10-CM

## 2017-01-04 DIAGNOSIS — M4317 Spondylolisthesis, lumbosacral region: Secondary | ICD-10-CM

## 2017-01-04 DIAGNOSIS — M51369 Other intervertebral disc degeneration, lumbar region without mention of lumbar back pain or lower extremity pain: Secondary | ICD-10-CM

## 2017-01-06 DIAGNOSIS — H40033 Anatomical narrow angle, bilateral: Secondary | ICD-10-CM | POA: Diagnosis not present

## 2017-01-06 DIAGNOSIS — G44209 Tension-type headache, unspecified, not intractable: Secondary | ICD-10-CM | POA: Diagnosis not present

## 2017-01-07 ENCOUNTER — Other Ambulatory Visit (INDEPENDENT_AMBULATORY_CARE_PROVIDER_SITE_OTHER): Payer: Self-pay | Admitting: Specialist

## 2017-01-13 ENCOUNTER — Encounter (INDEPENDENT_AMBULATORY_CARE_PROVIDER_SITE_OTHER): Payer: Self-pay | Admitting: Specialist

## 2017-01-13 ENCOUNTER — Ambulatory Visit (INDEPENDENT_AMBULATORY_CARE_PROVIDER_SITE_OTHER): Payer: Medicaid Other | Admitting: Specialist

## 2017-01-13 VITALS — BP 106/69 | HR 79

## 2017-01-13 DIAGNOSIS — G5621 Lesion of ulnar nerve, right upper limb: Secondary | ICD-10-CM | POA: Diagnosis not present

## 2017-01-13 DIAGNOSIS — M5116 Intervertebral disc disorders with radiculopathy, lumbar region: Secondary | ICD-10-CM

## 2017-01-13 DIAGNOSIS — M4722 Other spondylosis with radiculopathy, cervical region: Secondary | ICD-10-CM

## 2017-01-13 DIAGNOSIS — M5136 Other intervertebral disc degeneration, lumbar region: Secondary | ICD-10-CM

## 2017-01-13 DIAGNOSIS — M542 Cervicalgia: Secondary | ICD-10-CM

## 2017-01-13 MED ORDER — ACETAMINOPHEN-CODEINE #3 300-30 MG PO TABS: 1.0000 | ORAL_TABLET | Freq: Three times a day (TID) | ORAL | 0 refills | Status: DC | PRN

## 2017-01-13 NOTE — Patient Instructions (Addendum)
Avoid frequent bending and stooping  No lifting greater than 10 lbs. May use ice or moist heat for pain. Weight loss is of benefit. Handicap license is approved. MRI of the right wrist and elbow to assess for nerve pressure or compression. Lumbar condition will be treated with Epidural steroid injecitons  Right L4-5 and left L3-4 , Dr. Romona Curls secretary should contact you to arrange the cortisone injections of the spine.

## 2017-01-13 NOTE — Progress Notes (Signed)
Office Visit Note   Patient: Cheryl King           Date of Birth: 12-Jul-1965           MRN: [id number] Visit Date: 01/13/2017              Requested by: Arnoldo Morale, MD [place], [place] [id number] PCP: Arnoldo Morale, MD   Assessment & Plan: Visit Diagnoses:  1. Herniation of lumbar intervertebral disc with radiculopathy   2. Ulnar neuropathy of right upper extremity   3. Other spondylosis with radiculopathy, cervical region     Plan:Avoid frequent bending and stooping  No lifting greater than 10 lbs. May use ice or moist heat for pain. Weight loss is of benefit. Handicap license is approved. MRI of the right wrist and elbow to assess for nerve pressure or compression. Lumbar condition will be treated with Epidural steroid injecitons  Right L4-5 and left L3-4 , Dr. Romona Curls secretary should contact you to arrange the cortisone injections of the spine.   Follow-Up Instructions: Return in about 4 weeks (around 02/10/2017).   Orders:  Orders Placed This Encounter  Procedures  . MR Elbow Right w/o contrast  . MR Wrist Right w/o contrast  . Ambulatory referral to Physical Medicine Rehab   No orders of the defined types were placed in this encounter.     Procedures: No procedures performed   Clinical Data: No additional findings.   Subjective: Chief Complaint  Patient presents with  . Neck - Follow-up  . Lower Back - Follow-up    51 year old female right hand dominant with history of neck and low back pain. Pain in the neck is accomplanied by right arm pain and numbness into the right thumb and index and right ring finger. She also complains of pain into her legs pain with standing and walking and also with bending and stooping. No bowel or bladder difficulties.     Review of Systems   Objective: Vital Signs: BP 106/69   Pulse 79   Physical Exam  Ortho Exam  Specialty Comments:  No specialty comments  available.  Imaging: No results found.   PMFS History: Patient Active Problem List   Diagnosis Date Noted  . GERD (gastroesophageal reflux disease) 10/19/2016  . Prediabetes 05/03/2016  . Cubital tunnel syndrome on right 03/01/2016  . Numbness and tingling of right arm 01/01/2016  . Chlamydia infection 01/27/2015  . Smoking 12/26/2013  . Chronic lower back pain 04/13/2013  . Sciatica 04/13/2013  . Chronic neck pain 04/13/2013  . Spondylosis of cervical spine 04/13/2013   Past Medical History:  Diagnosis Date  . Acid reflux   . Carpal tunnel syndrome   . Diverticulitis   . Low back pain   . Sacroiliac inflammation ([place])   . Sciatica   . Seasonal allergies     Family History  Problem Relation Age of Onset  . Healthy Mother   . Other Father        Unsure of medical history  . Asthma Maternal Aunt   . Diabetes Maternal Aunt   . Cancer Maternal Aunt   . Breast cancer Maternal Aunt   . Hypertension Maternal Grandmother   . Colon cancer Neg Hx   . Pancreatic cancer Neg Hx   . Rectal cancer Neg Hx   . Stomach cancer Neg Hx     Past Surgical History:  Procedure Laterality Date  . ABDOMINAL HYSTERECTOMY    . ABDOMINAL SURGERY    .  ANKLE SURGERY     lt.  . right breast cyst removed     . uretherl     uretrral diverticultis   Social History   Occupational History  . Occupation: Unemployed  Tobacco Use  . Smoking status: Current Every Day Smoker    Packs/day: 0.50    Years: 35.00    Pack years: 17.50  . Smokeless tobacco: Never Used  Substance and Sexual Activity  . Alcohol use: Yes    Comment: Social only - seldom  . Drug use: No  . Sexual activity: No

## 2017-01-17 ENCOUNTER — Encounter: Payer: Self-pay | Admitting: Family Medicine

## 2017-01-17 ENCOUNTER — Ambulatory Visit: Payer: Medicaid Other | Attending: Family Medicine | Admitting: Family Medicine

## 2017-01-17 VITALS — BP 106/67 | HR 79 | Temp 98.2°F | Ht 67.0 in | Wt 154.2 lb

## 2017-01-17 DIAGNOSIS — M5442 Lumbago with sciatica, left side: Secondary | ICD-10-CM | POA: Diagnosis not present

## 2017-01-17 DIAGNOSIS — R0982 Postnasal drip: Secondary | ICD-10-CM | POA: Diagnosis not present

## 2017-01-17 DIAGNOSIS — G8929 Other chronic pain: Secondary | ICD-10-CM | POA: Insufficient documentation

## 2017-01-17 DIAGNOSIS — R7303 Prediabetes: Secondary | ICD-10-CM | POA: Insufficient documentation

## 2017-01-17 DIAGNOSIS — G5600 Carpal tunnel syndrome, unspecified upper limb: Secondary | ICD-10-CM | POA: Diagnosis not present

## 2017-01-17 DIAGNOSIS — R51 Headache: Secondary | ICD-10-CM | POA: Insufficient documentation

## 2017-01-17 DIAGNOSIS — Z880 Allergy status to penicillin: Secondary | ICD-10-CM | POA: Insufficient documentation

## 2017-01-17 DIAGNOSIS — K219 Gastro-esophageal reflux disease without esophagitis: Secondary | ICD-10-CM | POA: Insufficient documentation

## 2017-01-17 DIAGNOSIS — R519 Headache, unspecified: Secondary | ICD-10-CM

## 2017-01-17 DIAGNOSIS — Z79899 Other long term (current) drug therapy: Secondary | ICD-10-CM | POA: Diagnosis not present

## 2017-01-17 DIAGNOSIS — Z885 Allergy status to narcotic agent status: Secondary | ICD-10-CM | POA: Diagnosis not present

## 2017-01-17 DIAGNOSIS — E7800 Pure hypercholesterolemia, unspecified: Secondary | ICD-10-CM | POA: Diagnosis not present

## 2017-01-17 MED ORDER — FLUTICASONE PROPIONATE 50 MCG/ACT NA SUSP: 2.0000 | Freq: Every day | NASAL | 3 refills | Status: DC

## 2017-01-17 MED ORDER — DICLOFENAC SODIUM 50 MG PO TBEC: 50.0000 mg | DELAYED_RELEASE_TABLET | Freq: Two times a day (BID) | ORAL | 1 refills | Status: DC

## 2017-01-17 MED ORDER — DULOXETINE HCL 60 MG PO CPEP
60.0000 mg | ORAL_CAPSULE | Freq: Every day | ORAL | 1 refills | Status: DC
Start: 2017-01-17 — End: 2017-04-18

## 2017-01-17 MED ORDER — PREGABALIN 50 MG PO CAPS: 50.0000 mg | ORAL_CAPSULE | Freq: Every day | ORAL | 3 refills | Status: DC

## 2017-01-17 MED ORDER — BUTALBITAL-APAP-CAFFEINE 50-325-40 MG PO TABS: 1.0000 | ORAL_TABLET | Freq: Two times a day (BID) | ORAL | 1 refills | Status: DC | PRN

## 2017-01-17 MED ORDER — CETIRIZINE HCL 10 MG PO TABS: 10.0000 mg | ORAL_TABLET | Freq: Every day | ORAL | 1 refills | Status: DC

## 2017-01-17 MED ORDER — OMEPRAZOLE 20 MG PO CPDR: 20.0000 mg | DELAYED_RELEASE_CAPSULE | Freq: Every day | ORAL | 1 refills | Status: DC

## 2017-01-17 MED ORDER — CYCLOBENZAPRINE HCL 10 MG PO TABS: ORAL_TABLET | ORAL | 1 refills | Status: DC

## 2017-01-17 MED ORDER — ATORVASTATIN CALCIUM 20 MG PO TABS
20.0000 mg | ORAL_TABLET | Freq: Every day | ORAL | 1 refills | Status: DC
Start: 2017-01-17 — End: 2017-04-18

## 2017-01-17 MED FILL — ?ATORVASTATIN 20 MG TABLET: 20 | 30 days supply | Qty: 30 | Fill #0

## 2017-01-17 MED FILL — BUTALB-ACETAMIN-CAFF 50-325: 50-325-40 | 30 days supply | Qty: 60 | Fill #0

## 2017-01-17 MED FILL — ?DULOXETINE HCL DR 60 MG CA: 60 MG | 30 days supply | Qty: 30 | Fill #0

## 2017-01-17 MED FILL — ?CETIRIZINE HCL 10 MG TABLE: 10 | 30 days supply | Qty: 30 | Fill #0

## 2017-01-17 MED FILL — ACETAMINOPHEN/COD #3 TABLET: 300-30 | 10 days supply | Qty: 30 | Fill #0

## 2017-01-17 MED FILL — FLUTICASONE PROP 50 MCG SPR: 50 | 30 days supply | Qty: 16 | Fill #0

## 2017-01-17 MED FILL — ?DICLOFENAC SOD DR 50 MG TA: 50 | 30 days supply | Qty: 60 | Fill #0

## 2017-01-17 MED FILL — CYCLOBENZAPRINE 10 MG TAB: 10 | 30 days supply | Qty: 90 | Fill #0

## 2017-01-17 MED FILL — ?OMEPRAZOLE DR 20MG CAPSULE: 20 | 30 days supply | Qty: 30 | Fill #0

## 2017-01-17 NOTE — Patient Instructions (Signed)
Sinus Headache A sinus headache happens when your sinuses become clogged or swollen. You may feel pain or pressure in your face, forehead, ears, or upper teeth. Sinus headaches can be mild or severe. Follow these instructions at home:  Take medicines only as told by your doctor.  If you were given an antibiotic medicine, finish all of it even if you start to feel better.  Use a nose spray if you feel stuffed up (congested).  If told, apply a warm, moist washcloth to your face to help lessen pain. Contact a doctor if:  You get headaches more than one time each week.  Light or sound bothers you.  You have a fever.  You feel sick to your stomach (nauseous) or you throw up (vomit).  Your headaches do not get better with treatment. Get help right away if:  You have trouble seeing.  You suddenly have very bad pain in your face or head.  You start to twitch or shake (seizure).  You are confused.  You have a stiff neck. This information is not intended to replace advice given to you by your health care provider. Make sure you discuss any questions you have with your health care provider. Document Released: 05/06/2010 Document Revised: 08/31/2015 Document Reviewed: 12/31/2013 Elsevier Interactive Patient Education  2018 Elsevier Inc.  

## 2017-01-17 NOTE — Progress Notes (Signed)
 Subjective:  Patient ID: Cheryl King, female    DOB: 06/21/1965  Age: 51 y.o. MRN: [id number]  CC: Headache   HPI Cheryl King is 51-year-old female with Medical history  significant for Prediabetes (diet controlled) chronic low back pain with sciatica, GERD, hyperlipidemia here for a follow-up visit.  Her reflux symptoms have not been controlled on ranitidine and she is requesting switching back to omeprazole which she did better on.  She endorses compliance with her statin and denies myalgias.  She complains of headaches which occur about 3 times a week from morning to evening with unknown precipitating events.  She was previously on amitriptyline which was discontinued by orthopedics however she states she had no relief even while taking it. She does have sinus symptoms which are not controlled on Zyrtec and endorses burning in her nostrils and postnasal drip. Denies nausea, blurry vision and has no prior history of migraines.  Her low back pain is managed by orthopedics- Dr Nitka her last visit was 4 days ago and she is scheduled for upcoming epidural spinal injection. Pain occurs in her left buttock check and does not radiate.  She does have an elevated PHQ 9 score of 18 in the clinic and she denies suicidal ideation or intents-informs me she has an upcoming appointment with a counselor. She currently takes Cymbalta.  Past Medical History:  Diagnosis Date  . Acid reflux   . Carpal tunnel syndrome   . Diverticulitis   . Low back pain   . Sacroiliac inflammation ([facility])   . Sciatica   . Seasonal allergies     Past Surgical History:  Procedure Laterality Date  . ABDOMINAL HYSTERECTOMY    . ABDOMINAL SURGERY    . ANKLE SURGERY     lt.  . right breast cyst removed     . uretherl     uretrral diverticultis    Allergies  Allergen Reactions  . Percocet [Oxycodone-Acetaminophen] Nausea Only  . Vicodin [Hydrocodone-Acetaminophen] Nausea Only  . Penicillins  Rash     Outpatient Medications Prior to Visit  Medication Sig Dispense Refill  . acetaminophen-codeine (TYLENOL #3) 300-30 MG tablet Take 1 tablet by mouth every 8 (eight) hours as needed for moderate pain. 30 tablet 0  . diclofenac sodium (VOLTAREN) 1 % GEL Apply 2 g topically 4 (four) times daily.    . Lactobacillus CAPS Take 1 capsule by mouth daily.    . Multiple Vitamin (MULTIVITAMIN WITH MINERALS) TABS tablet Take 1 tablet by mouth daily.    . ondansetron (ZOFRAN) 4 MG tablet TAKE 1 TABLET BY MOUTH EVERY 8 HOURS AS NEEDED FOR NAUSEA OR VOMITING. 60 tablet 1  . atorvastatin (LIPITOR) 20 MG tablet Take 1 tablet (20 mg total) by mouth daily. 90 tablet 1  . cetirizine (ZYRTEC) 10 MG tablet Take 1 tablet (10 mg total) by mouth daily. 30 tablet 1  . cyclobenzaprine (FLEXERIL) 10 MG tablet TAKE 1 TABLET BY MOUTH 3 TIMES DAILY AS NEEDED FOR MUSCLE SPASMS. 90 tablet 1  . diclofenac (VOLTAREN) 50 MG EC tablet Take 1 tablet (50 mg total) by mouth 2 (two) times daily. 180 tablet 1  . DULoxetine (CYMBALTA) 60 MG capsule Take 1 capsule (60 mg total) by mouth daily. 90 capsule 1  . pregabalin (LYRICA) 50 MG capsule Take 1 capsule (50 mg total) by mouth at bedtime. 90 capsule 3  . ranitidine (ZANTAC) 150 MG tablet Take 1 tablet (150 mg total) by mouth 2 (two) times daily. 180 tablet   1  . amitriptyline (ELAVIL) 25 MG tablet TAKE 1 TABLET BY MOUTH AT BEDTIME. (Patient not taking: Reported on 01/17/2017) 30 tablet 12   No facility-administered medications prior to visit.     ROS Review of Systems  Constitutional: Negative for activity change, appetite change and fatigue.  HENT: Negative for congestion, sinus pressure and sore throat.   Eyes: Negative for visual disturbance.  Respiratory: Negative for cough, chest tightness, shortness of breath and wheezing.   Cardiovascular: Negative for chest pain and palpitations.  Gastrointestinal: Negative for abdominal distention, abdominal pain and  constipation.  Endocrine: Negative for polydipsia.  Genitourinary: Negative for dysuria and frequency.  Musculoskeletal: Positive for back pain. Negative for arthralgias.  Skin: Negative for rash.  Neurological: Positive for headaches. Negative for tremors, light-headedness and numbness.  Hematological: Does not bruise/bleed easily.  Psychiatric/Behavioral: Negative for agitation and behavioral problems.    Objective:  BP 106/67   Pulse 79   Temp 98.2 F (36.8 C) (Oral)   Ht 5' 7" (1.702 m)   Wt 154 lb 3.2 oz (69.9 kg)   SpO2 95%   BMI 24.15 kg/m   BP/Weight 01/17/2017 01/13/2017 75/44/9201  Systolic BP 007 121 93  Diastolic BP 67 69 62  Wt. (Lbs) 154.2 - 152  BMI 24.15 - 23.81      Physical Exam  Constitutional: She is oriented to person, place, and time. She appears well-developed and well-nourished.  Cardiovascular: Normal rate, normal heart sounds and intact distal pulses.  No murmur heard. Pulmonary/Chest: Effort normal and breath sounds normal. She has no wheezes. She has no rales. She exhibits no tenderness.  Abdominal: Soft. Bowel sounds are normal. She exhibits no distension and no mass. There is no tenderness.  Musculoskeletal: Normal range of motion. She exhibits tenderness (TTP of left butt cheek; negative straight leg raise b/l).  Neurological: She is alert and oriented to person, place, and time.  Skin: Skin is warm and dry.  Psychiatric: She has a normal mood and affect.     CMP Latest Ref Rng & Units 05/03/2016 04/13/2013 11/10/2011  Glucose 65 - 99 mg/dL 81 76 69(L)  BUN 6 - 24 mg/dL _0 Creatinine 0.57 - 1.00 mg/dL 0.74 0.74 0.68  Sodium 134 - 144 mmol/L 143 137 138  Potassium 3.5 - 5.2 mmol/L 4.9 4.3 3.7  Chloride 96 - 106 mmol/L 103 101 100  CO2 18 - 29 mmol/L _1 Calcium 8.7 - 10.2 mg/dL 9.8 10.0 9.2  Total Protein 6.0 - 8.3 g/dL - 7.3 7.1  Total Bilirubin 0.2 - 1.2 mg/dL - 0.4 0.3  Alkaline Phos 39 - 117 U/L - 75 63  AST 0 - 37  U/L - 21 17  ALT 0 - 35 U/L - 12 14    Lipid Panel     Component Value Date/Time   CHOL 170 07/16/2016 1001   TRIG 74 07/16/2016 1001   HDL 53 07/16/2016 1001   CHOLHDL 3.2 07/16/2016 1001   CHOLHDL 3.2 12/26/2013 1319   VLDL 18 12/26/2013 1319   LDLCALC 102 (H) 07/16/2016 1001    Assessment & Plan:   1. Chronic midline low back pain with left-sided sciatica Controlled Has upcoming appointment for epidural spinal injections - pregabalin (LYRICA) 50 MG capsule; Take 1 capsule (50 mg total) by mouth at bedtime.  Dispense: 30 capsule; Refill: 3 - DULoxetine (CYMBALTA) 60 MG capsule; Take 1 capsule (60 mg total) by mouth daily.  Dispense: 90 capsule; Refill:  1 - cyclobenzaprine (FLEXERIL) 10 MG tablet; TAKE 1 TABLET BY MOUTH 3 TIMES DAILY AS NEEDED FOR MUSCLE SPASMS.  Dispense: 90 tablet; Refill: 1  2. Post-nasal drip Uncontrolled Flonase added to regimen - fluticasone (FLONASE) 50 MCG/ACT nasal spray; Place 2 sprays into both nostrils daily.  Dispense: 16 g; Refill: 3 - cetirizine (ZYRTEC) 10 MG tablet; Take 1 tablet (10 mg total) by mouth daily.  Dispense: 90 tablet; Refill: 1  3. Pure hypercholesterolemia Controlled - atorvastatin (LIPITOR) 20 MG tablet; Take 1 tablet (20 mg total) by mouth daily.  Dispense: 90 tablet; Refill: 1 - CMP14+EGFR  4. Sinus headache Uncontrolled If symptoms persist will need to placed on Imitrex Unable to use Topamax as this could cause weight loss which she does not desire Elavil was discontinued by orthopedics - butalbital-acetaminophen-caffeine (FIORICET, ESGIC) 50-325-40 MG tablet; Take 1 tablet by mouth every 12 (twelve) hours as needed for headache.  Dispense: 60 tablet; Refill: 1  5. Gastroesophageal reflux disease without esophagitis Controlled Discontinue ranitidine and commence omeprazole Discussed side effects of long-term use of omeprazole - omeprazole (PRILOSEC) 20 MG capsule; Take 1 capsule (20 mg total) by mouth daily.   Dispense: 90 capsule; Refill: 1   Meds ordered this encounter  Medications  . fluticasone (FLONASE) 50 MCG/ACT nasal spray    Sig: Place 2 sprays into both nostrils daily.    Dispense:  16 g    Refill:  3  . omeprazole (PRILOSEC) 20 MG capsule    Sig: Take 1 capsule (20 mg total) by mouth daily.    Dispense:  90 capsule    Refill:  1  . pregabalin (LYRICA) 50 MG capsule    Sig: Take 1 capsule (50 mg total) by mouth at bedtime.    Dispense:  30 capsule    Refill:  3  . DULoxetine (CYMBALTA) 60 MG capsule    Sig: Take 1 capsule (60 mg total) by mouth daily.    Dispense:  90 capsule    Refill:  1    90 day supply  . diclofenac (VOLTAREN) 50 MG EC tablet    Sig: Take 1 tablet (50 mg total) by mouth 2 (two) times daily.    Dispense:  180 tablet    Refill:  1    90 day supply  . cyclobenzaprine (FLEXERIL) 10 MG tablet    Sig: TAKE 1 TABLET BY MOUTH 3 TIMES DAILY AS NEEDED FOR MUSCLE SPASMS.    Dispense:  90 tablet    Refill:  1    90 day supply  . cetirizine (ZYRTEC) 10 MG tablet    Sig: Take 1 tablet (10 mg total) by mouth daily.    Dispense:  90 tablet    Refill:  1  . atorvastatin (LIPITOR) 20 MG tablet    Sig: Take 1 tablet (20 mg total) by mouth daily.    Dispense:  90 tablet    Refill:  1    90 day supply  . butalbital-acetaminophen-caffeine (FIORICET, ESGIC) 50-325-40 MG tablet    Sig: Take 1 tablet by mouth every 12 (twelve) hours as needed for headache.    Dispense:  60 tablet    Refill:  1    Follow-up: Return in about 3 months (around 04/17/2017) for follow up of chronic medical conditions.   Enobong Amao MD   

## 2017-01-18 LAB — CMP14+EGFR
A/G RATIO: 1.7 (ref 1.2–2.2)
ALBUMIN: 4.3 g/dL (ref 3.5–5.5)
ALT: 17 IU/L (ref 0–32)
AST: 22 IU/L (ref 0–40)
Alkaline Phosphatase: 99 IU/L (ref 39–117)
BUN / CREAT RATIO: 26 — AB (ref 9–23)
BUN: 19 mg/dL (ref 6–24)
Bilirubin Total: 0.3 mg/dL (ref 0.0–1.2)
CALCIUM: 9.7 mg/dL (ref 8.7–10.2)
CO2: 25 mmol/L (ref 20–29)
CREATININE: 0.74 mg/dL (ref 0.57–1.00)
Chloride: 103 mmol/L (ref 96–106)
GFR, EST AFRICAN AMERICAN: 107 mL/min/{1.73_m2} (ref >59)
GFR, EST NON AFRICAN AMERICAN: 93 mL/min/{1.73_m2} (ref >59)
GLOBULIN, TOTAL: 2.6 g/dL (ref 1.5–4.5)
Glucose: 116 mg/dL — ABNORMAL HIGH (ref 65–99)
POTASSIUM: 4.3 mmol/L (ref 3.5–5.2)
SODIUM: 141 mmol/L (ref 134–144)
TOTAL PROTEIN: 6.9 g/dL (ref 6.0–8.5)

## 2017-01-23 ENCOUNTER — Other Ambulatory Visit: Payer: Medicaid Other

## 2017-01-27 ENCOUNTER — Encounter (INDEPENDENT_AMBULATORY_CARE_PROVIDER_SITE_OTHER): Payer: Self-pay | Admitting: Physical Medicine and Rehabilitation

## 2017-01-27 ENCOUNTER — Ambulatory Visit (INDEPENDENT_AMBULATORY_CARE_PROVIDER_SITE_OTHER): Payer: Medicaid Other | Admitting: Physical Medicine and Rehabilitation

## 2017-01-27 VITALS — BP 106/68 | HR 74 | Temp 98.8°F

## 2017-01-27 DIAGNOSIS — M5416 Radiculopathy, lumbar region: Secondary | ICD-10-CM

## 2017-01-27 NOTE — Progress Notes (Signed)
Patient is followed by Dr. Louanne Skye who requested transforaminal epidural steroid injection.  She has constant lower back pain that radiates to both sides of her legs and down both feet with numbness and tingling.  She did not come in today with a driver.  She was told by the staff although she says to [place] that she was not informed about needing a driver.  She will reschedule.  We did not really see her today and we will have this is a no charge visit.  They can apply her co-pay if she had one to the next visit.

## 2017-01-30 ENCOUNTER — Other Ambulatory Visit: Payer: Medicaid Other

## 2017-01-31 ENCOUNTER — Encounter (INDEPENDENT_AMBULATORY_CARE_PROVIDER_SITE_OTHER): Payer: Self-pay | Admitting: Physical Medicine and Rehabilitation

## 2017-01-31 ENCOUNTER — Ambulatory Visit (INDEPENDENT_AMBULATORY_CARE_PROVIDER_SITE_OTHER): Payer: Medicaid Other | Admitting: Physical Medicine and Rehabilitation

## 2017-01-31 ENCOUNTER — Ambulatory Visit (INDEPENDENT_AMBULATORY_CARE_PROVIDER_SITE_OTHER): Payer: Medicaid Other

## 2017-01-31 VITALS — BP 107/67 | HR 77 | Temp 98.5°F

## 2017-01-31 DIAGNOSIS — M5116 Intervertebral disc disorders with radiculopathy, lumbar region: Secondary | ICD-10-CM

## 2017-01-31 DIAGNOSIS — M5416 Radiculopathy, lumbar region: Secondary | ICD-10-CM

## 2017-01-31 MED ORDER — BETAMETHASONE SOD PHOS & ACET 6 (3-3) MG/ML IJ SUSP
12.0000 mg | Freq: Once | INTRAMUSCULAR | Status: AC
  Administered 2017-01-31: 12 mg

## 2017-01-31 NOTE — Patient Instructions (Signed)

## 2017-01-31 NOTE — Procedures (Signed)
Cheryl King is a 52 year old female with chronic worsening low back and bilateral radicular leg pain.  She is followed by Dr. Louanne Skye who request diagnostic and hopefully therapeutic right L4 and left L3 transforaminal epidural steroid injection.  The injection  will be diagnostic and hopefully therapeutic. The patient has failed conservative care including time, medications and activity modification.  Lumbosacral Transforaminal Epidural Steroid Injection - Sub-Pedicular Approach with Fluoroscopic Guidance  Patient: Cheryl King      Date of Birth: 1965/09/11 MRN: [id number] PCP: Arnoldo Morale, MD      Visit Date: 01/31/2017   Universal Protocol:    Date/Time: 01/31/2017  Consent Given By: the patient  Position: PRONE  Additional Comments: Vital signs were monitored before and after the procedure. Patient was prepped and draped in the usual sterile fashion. The correct patient, procedure, and site was verified.   Injection Procedure Details:  Procedure Site One Meds Administered:  Meds ordered this encounter  Medications  . betamethasone acetate-betamethasone sodium phosphate (CELESTONE) injection 12 mg    Laterality: Right, Left  Location/Site:  L4-L5, L3-4  Needle size: 22 G  Needle type: Spinal  Needle Placement: Transforaminal  Findings:    -Comments: Excellent flow of contrast along the nerve and into the epidural space.  Procedure Details: After squaring off the end-plates to get a true AP view, the C-arm was positioned so that an oblique view of the foramen as noted above was visualized. The target area is just inferior to the "nose of the scotty dog" or sub pedicular. The soft tissues overlying this structure were infiltrated with 2-3 ml. of 1% Lidocaine without Epinephrine.  The spinal needle was inserted toward the target using a "trajectory" view along the fluoroscope beam.  Under AP and lateral visualization, the needle was advanced so it did not  puncture dura and was located close the 6 O'Clock position of the pedical in AP tracterory. Biplanar projections were used to confirm position. Aspiration was confirmed to be negative for CSF and/or blood. A 1-2 ml. volume of Isovue-250 was injected and flow of contrast was noted at each level. Radiographs were obtained for documentation purposes.   After attaining the desired flow of contrast documented above, a 0.5 to 1.0 ml test dose of 0.25% Marcaine was injected into each respective transforaminal space.  The patient was observed for 90 seconds post injection.  After no sensory deficits were reported, and normal lower extremity motor function was noted,   the above injectate was administered so that equal amounts of the injectate were placed at each foramen (level) into the transforaminal epidural space.   Additional Comments:  No complications occurred Dressing: Band-Aid    Post-procedure details: Patient was observed during the procedure. Post-procedure instructions were reviewed.  Patient left the clinic in stable condition.   Pertinent Imaging: TECHNIQUE: Multiplanar, multisequence MR imaging of the lumbar spine was performed. No intravenous contrast was administered.  COMPARISON:  Lumbar spine MRI 05/03/2013  FINDINGS: Segmentation:  Standard.  Alignment:  Physiologic.  Vertebrae:  No fracture, evidence of discitis, or bone lesion.  Conus medullaris and cauda equina: Conus extends to the L1 level. Conus and cauda equina appear normal.  Paraspinal and other soft tissues: Negative.  Disc levels:  T11-T12:  Minimal disc bulge without stenosis.  T12-L1:  Normal disc space without stenosis.  L1-L2: Marked worsening of degenerative disc height loss with endplate remodeling. Small central disc protrusion without stenosis. Normal facets.  L2-L3: Marked worsening of disc height loss. Left eccentric  disc bulge with mild-to-moderate narrowing of the left  neural foramen. No other stenosis.  L3-L4: Worsened disc degeneration with left foraminal protrusion and annular fissure in close proximity to the exiting left L3 nerve root and causing mild foraminal stenosis. No spinal canal stenosis. Normal facets.  L4-L5: Mild disc desiccation with right foraminal disc protrusion that is worsened from the prior study. This causes severe right neural foraminal stenosis. Mild left foraminal stenosis. No spinal canal stenosis. Mild right facet hypertrophy.  L5-S1: Disc desiccation with minimal bulge.  No stenosis.  IMPRESSION: 1. L4-L5 right foraminal disc protrusion, new/worsened from the prior study, causing severe right neural foraminal stenosis and likely irritating the right L4 nerve root. 2. L2-L3 and L3-L4 worsened degenerative disc disease with left-sided bulges/protrusions causing mild-to-moderate left foraminal stenosis and possible irritation of the exiting nerve roots.   Electronically Signed   By: Ulyses Jarred M.D.   On: 01/04/2017 22:52

## 2017-01-31 NOTE — Progress Notes (Deleted)
Pt states pain in lower back. No changes since last visit 01/27/17. +Driver, -BT, -Dye Allergies.  Right L4 and Left L3 TF esi.

## 2017-02-01 ENCOUNTER — Ambulatory Visit (INDEPENDENT_AMBULATORY_CARE_PROVIDER_SITE_OTHER): Payer: Medicaid Other | Admitting: Specialist

## 2017-02-01 ENCOUNTER — Encounter (INDEPENDENT_AMBULATORY_CARE_PROVIDER_SITE_OTHER): Payer: Self-pay | Admitting: Specialist

## 2017-02-01 VITALS — BP 98/64 | HR 62 | Temp 97.2°F | Ht 67.0 in | Wt 151.0 lb

## 2017-02-01 DIAGNOSIS — R202 Paresthesia of skin: Secondary | ICD-10-CM

## 2017-02-01 DIAGNOSIS — G5621 Lesion of ulnar nerve, right upper limb: Secondary | ICD-10-CM | POA: Diagnosis not present

## 2017-02-01 DIAGNOSIS — M5030 Other cervical disc degeneration, unspecified cervical region: Secondary | ICD-10-CM | POA: Diagnosis not present

## 2017-02-01 DIAGNOSIS — M5136 Other intervertebral disc degeneration, lumbar region: Secondary | ICD-10-CM | POA: Diagnosis not present

## 2017-02-01 DIAGNOSIS — M5126 Other intervertebral disc displacement, lumbar region: Secondary | ICD-10-CM

## 2017-02-01 DIAGNOSIS — M4802 Spinal stenosis, cervical region: Secondary | ICD-10-CM

## 2017-02-01 DIAGNOSIS — R200 Anesthesia of skin: Secondary | ICD-10-CM | POA: Diagnosis not present

## 2017-02-01 NOTE — Patient Instructions (Addendum)
Avoid frequent bending and stooping  No lifting greater than 10 lbs. May use ice or moist heat for pain. Weight loss is of benefit. Handicap license is approved.  Avoid overhead lifting and overhead use of the arms. Do not lift greater than 5 lbs. Adjust head rest in vehicle to prevent hyperextension if rear ended. Take extra precautions to avoid falling, . EMG/NCV studies ordered

## 2017-02-01 NOTE — Progress Notes (Signed)
Office Visit Note   Patient: Cheryl King           Date of Birth: March 09, 1965           MRN: [id number] Visit Date: 02/01/2017              Requested by: Arnoldo Morale, MD [place], [place] [id number] PCP: Arnoldo Morale, MD   Assessment & Plan: Visit Diagnoses:  1. Numbness and tingling of right hand   2. Ulnar neuropathy at wrist, right   3. Disc disease, degenerative, cervical   4. Spinal stenosis of cervical region   5. Degenerative disc disease, lumbar   6. 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 She should apply for SSDI. Return in 2 weeks to assess progress. Obtain repeat EMG/NCV to assess if there are changes in right arm since last EMG 12/2015.   Plan: Avoid frequent bending and stooping  No lifting greater than 10 lbs. May use ice or moist heat for pain. Weight loss is of benefit. Handicap license is approved.  Avoid overhead lifting and overhead use of the arms. Do not lift greater than 5 lbs. Adjust head rest in vehicle to prevent hyperextension if rear ended. Take extra precautions to avoid falling, . EMG/NCV studies ordered, .   Follow-Up Instructions: Return in about 2 weeks (around 02/15/2017).   Orders:  No orders of the defined types were placed in this encounter.  No orders of the defined types were placed in this encounter.     Procedures: No procedures performed   Clinical Data: No additional findings.   Subjective: Chief Complaint  Patient presents with  . Lower Back - Follow-up    52 year old female with low back pain and bilateral leg pain and numbness with pain in the feet with numbness and tingleing.     Review of Systems  Constitutional: Negative.   HENT: Positive for rhinorrhea and sinus pain. Negative for congestion and sinus pressure.   Eyes: Positive for  visual disturbance.  Respiratory: Negative.   Cardiovascular: Negative.   Gastrointestinal: Negative.   Endocrine: Negative.   Genitourinary: Negative.   Musculoskeletal: Negative.   Skin: Negative.   Allergic/Immunologic: Negative.   Neurological: Negative.   Hematological: Negative.   Psychiatric/Behavioral: Negative.      Objective: Vital Signs: BP 98/64 (BP Location: Right Arm, Patient Position: Sitting, Cuff Size: Normal)   Pulse 62   Temp (!) 97.2 F (36.2 C) (Oral)   Ht 5\' 7"  (1.702 m)   Wt 151 lb (68.5 kg)   BMI 23.65 kg/m   Physical Exam  Back Exam   Tenderness  The patient is experiencing tenderness in the cervical and lumbar.  Range of Motion  Extension: abnormal  Flexion: abnormal  Lateral bend right: abnormal  Lateral bend left: abnormal  Rotation right: abnormal  Rotation left: abnormal   Muscle Strength  Right Quadriceps:  5/5  Left Quadriceps:  5/5  Right Hamstrings:  5/5  Left Hamstrings:  5/5   Tests  Straight leg raise right: negative Straight leg raise left: negative  Reflexes  Patellar: normal Achilles: normal Biceps: normal Babinski's sign: normal   Other  Sensation: normal Gait: normal       Specialty Comments:  No specialty comments available.  Imaging: Xr C-arm No Report  Result Date:  01/31/2017 Please see Notes or Procedures tab for imaging impression.    PMFS History: Patient Active Problem List   Diagnosis Date Noted  . GERD (gastroesophageal reflux disease) 10/19/2016  . Prediabetes 05/03/2016  . Cubital tunnel syndrome on right 03/01/2016  . Numbness and tingling of right arm 01/01/2016  . Chlamydia infection 01/27/2015  . Smoking 12/26/2013  . Chronic lower back pain 04/13/2013  . Sciatica 04/13/2013  . Chronic neck pain 04/13/2013  . Spondylosis of cervical spine 04/13/2013   Past Medical History:  Diagnosis Date  . Acid reflux   . Carpal tunnel syndrome   . Diverticulitis   . Low back pain     . 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 Sciatica   . Seasonal allergies     Family History  Problem Relation Age of Onset  . Healthy Mother   . Other Father        Unsure of medical history  . Asthma Maternal Aunt   . Diabetes Maternal Aunt   . Cancer Maternal Aunt   . Breast cancer Maternal Aunt   . Hypertension Maternal Grandmother   . Colon cancer Neg Hx   . Pancreatic cancer Neg Hx   . Rectal cancer Neg Hx   . Stomach cancer Neg Hx     Past Surgical History:  Procedure Laterality Date  . ABDOMINAL HYSTERECTOMY    . ABDOMINAL SURGERY    . ANKLE SURGERY     lt.  . right breast cyst removed     . uretherl     uretrral diverticultis   Social History   Occupational History  . Occupation: Unemployed  Tobacco Use  . Smoking status: Current Every Day Smoker    Packs/day: 0.50    Years: 35.00    Pack years: 17.50  . Smokeless tobacco: Never Used  Substance and Sexual Activity  . Alcohol use: Yes    Comment: Social only - seldom  . Drug use: No  . Sexual activity: No

## 2017-02-14 MED FILL — FLUTICASONE PROP 50 MCG SPR: 50 | 30 days supply | Qty: 16 | Fill #1

## 2017-02-14 MED FILL — ?CETIRIZINE HCL 10 MG TABLE: 10 | 30 days supply | Qty: 30 | Fill #1

## 2017-02-14 MED FILL — ?DICLOFENAC SOD DR 50 MG TA: 50 | 30 days supply | Qty: 60 | Fill #1

## 2017-02-14 MED FILL — ?CYCLOBENZAPRINE 10MG TB: 10 | 30 days supply | Qty: 90 | Fill #1

## 2017-02-14 MED FILL — ?DULOXETINE HCL DR 60 MG CA: 60 MG | 30 days supply | Qty: 30 | Fill #1

## 2017-02-14 MED FILL — ?OMEPRAZOLE 20 MG CPDR: 20 | 30 days supply | Qty: 30 | Fill #1

## 2017-02-14 MED FILL — ?ATORVASTATIN 20MG TABLET: 20 | 30 days supply | Qty: 30 | Fill #1

## 2017-02-17 ENCOUNTER — Ambulatory Visit (INDEPENDENT_AMBULATORY_CARE_PROVIDER_SITE_OTHER): Payer: Medicaid Other | Admitting: Physical Medicine and Rehabilitation

## 2017-02-17 ENCOUNTER — Encounter (INDEPENDENT_AMBULATORY_CARE_PROVIDER_SITE_OTHER): Payer: Self-pay | Admitting: Physical Medicine and Rehabilitation

## 2017-02-17 DIAGNOSIS — R202 Paresthesia of skin: Secondary | ICD-10-CM | POA: Diagnosis not present

## 2017-02-17 NOTE — Progress Notes (Signed)
Cheryl King - 52 y.o. female MRN [id number]  Date of birth: [birth year]  Office Visit Note: Visit Date: 02/17/2017 PCP: Charlott Rakes, MD Referred by: Charlott Rakes, MD  Subjective: Chief Complaint  Patient presents with  . Right Hand - Tingling, Numbness   HPI: Mrs. Bodner is a 52 year old right-hand-dominant female who comes in today at the request of Dr. Louanne Skye for electrodiagnostic evaluation of her right upper extremity.  She reports numbness and tingling in the right hand particularly the fifth digit and sometimes the fourth digit.  She reports the symptoms have been going on for several years and are constant.  She reports that if she massages the elbow it will make the numbness and tingling worse.  She reports that nothing is really made it better she has had no surgery or decompression of the ulnar nerve.  She has had 2 prior electrodiagnostic studies these are reviewed below.  The one in March 2016 showed the very mildest of slowing in the sensory ulnar nerve.  This could have been temperature artifact.  2017 electrodiagnostic study was very similar.    ROS Otherwise per HPI.  Assessment & Plan: Visit Diagnoses:  1. Paresthesia of skin     Plan: No additional findings.  Impression: Essentially NORMAL electrodiagnostic study of the left upper limb.  There is no significant electrodiagnostic evidence of nerve entrapment, brachial plexopathy or cervical radiculopathy.    As you know, purely sensory or demyelinating radiculopathies and chemical radiculitis may not be detected with this particular electrodiagnostic study.  Recommendations: 1.  Follow-up with referring physician. 2.  Continue current management of symptoms.  Meds & Orders: No orders of the defined types were placed in this encounter.   Orders Placed This Encounter  Procedures  . NCV with EMG (electromyography)    Follow-up: Return for Dr. Louanne Skye.   Procedures: No procedures performed  EMG &  NCV Findings: All nerve conduction studies (as indicated in the following tables) were within normal limits.    All examined muscles (as indicated in the following table) showed no evidence of electrical instability.    Impression: Essentially NORMAL electrodiagnostic study of the left upper limb.  There is no significant electrodiagnostic evidence of nerve entrapment, brachial plexopathy or cervical radiculopathy.    As you know, purely sensory or demyelinating radiculopathies and chemical radiculitis may not be detected with this particular electrodiagnostic study.  Recommendations: 1.  Follow-up with referring physician. 2.  Continue current management of symptoms.   Nerve Conduction Studies Anti Sensory Summary Table   Stim Site NR Peak (ms) Norm Peak (ms) P-T Amp (V) Norm P-T Amp Site1 Site2 Delta-P (ms) Dist (cm) Vel (m/s) Norm Vel (m/s)  Right Median Acr Palm Anti Sensory (2nd Digit)  30.8C  Wrist    3.6 <3.6 31.8 >10 Wrist Palm 1.8 0.0    Palm    1.8 <2.0 25.1         Right Radial Anti Sensory (Base 1st Digit)  31.9C  Wrist    2.3 <3.1 15.6  Wrist Base 1st Digit 2.3 0.0    Right Ulnar Anti Sensory (5th Digit)  31.3C  Wrist    3.3 <3.7 21.3 >15.0 Wrist 5th Digit 3.3 14.0 42 >38   Motor Summary Table   Stim Site NR Onset (ms) Norm Onset (ms) O-P Amp (mV) Norm O-P Amp Site1 Site2 Delta-0 (ms) Dist (cm) Vel (m/s) Norm Vel (m/s)  Right Median Motor (Abd Poll Brev)  32C  Wrist  3.4 <4.2 10.0 >5 Elbow Wrist 3.9 21.0 54 >50  Elbow    7.3  9.2         Right Ulnar Motor (Abd Dig Min)  32C  Wrist    2.8 <4.2 7.1 >3 B Elbow Wrist 3.4 19.5 57 >53  B Elbow    6.2  9.3  A Elbow B Elbow 1.0 9.0 90 >53  A Elbow    7.2  10.2          EMG   Side Muscle Nerve Root Ins Act Fibs Psw Amp Dur Poly Recrt Int Fraser Din Comment  Right 1stDorInt Ulnar C8-T1 Nml Nml Nml Nml Nml 0 Nml Nml   Right Abd Poll Brev Median C8-T1 Nml Nml Nml Nml Nml 0 Nml Nml   Right ExtDigCom   Nml Nml Nml Nml Nml 0  Nml Nml   Right Triceps Radial C6-7-8 Nml Nml Nml Nml Nml 0 Nml Nml   Right Deltoid Axillary C5-6 Nml Nml Nml Nml Nml 0 Nml Nml     Nerve Conduction Studies Anti Sensory Left/Right Comparison   Stim Site L Lat (ms) R Lat (ms) L-R Lat (ms) L Amp (V) R Amp (V) L-R Amp (%) Site1 Site2 L Vel (m/s) R Vel (m/s) L-R Vel (m/s)  Median Acr Palm Anti Sensory (2nd Digit)  30.8C  Wrist  3.6   31.8  Wrist Palm     Palm  1.8   25.1        Radial Anti Sensory (Base 1st Digit)  31.9C  Wrist  2.3   15.6  Wrist Base 1st Digit     Ulnar Anti Sensory (5th Digit)  31.3C  Wrist  3.3   21.3  Wrist 5th Digit  42    Motor Left/Right Comparison   Stim Site L Lat (ms) R Lat (ms) L-R Lat (ms) L Amp (mV) R Amp (mV) L-R Amp (%) Site1 Site2 L Vel (m/s) R Vel (m/s) L-R Vel (m/s)  Median Motor (Abd Poll Brev)  32C  Wrist  3.4   10.0  Elbow Wrist  54   Elbow  7.3   9.2        Ulnar Motor (Abd Dig Min)  32C  Wrist  2.8   7.1  B Elbow Wrist  57   B Elbow  6.2   9.3  A Elbow B Elbow  90   A Elbow  7.2   10.2           Waveforms:            Clinical History: 01/01/2016 EMG/NCS Impression: Essentially NORMAL electrodiagnostic study of both upper limbs. There was once again very very mild distal slowing at the ulnar nerve at the wrist.This represents no essential change from the prior study in 2016  There is no significant electrodiagnostic evidence of nerve entrapment, brachial plexopathy, cervical radiculopathy or generalized peripheral neuropathy. As you know, purely sensory or demyelinating radiculopathies and chemical radiculitis may not be detected with this particular electrodiagnostic study.  March 2016 EMG/NCS Very mild slowing of the ulnar nerve distally at the wrist but otherwise was normal. That distal slowing was probably temperature artifact but did sort of fit with more numbness tingling in the right fourth and fifth digit.    MRI LUMBAR SPINE  COMPARISON: Lumbar spine MRI  05/03/2013  FINDINGS: Segmentation: Standard.  Alignment: Physiologic.  Vertebrae: No fracture, evidence of discitis, or bone lesion.  Conus medullaris and cauda equina: Conus extends to  the L1 level. Conus and cauda equina appear normal.  Paraspinal and other soft tissues: Negative.  Disc levels:  T11-T12: Minimal disc bulge without stenosis.  T12-L1: Normal disc space without stenosis.  L1-L2: Marked worsening of degenerative disc height loss with endplate remodeling. Small central disc protrusion without stenosis. Normal facets.  L2-L3: Marked worsening of disc height loss. Left eccentric disc bulge with mild-to-moderate narrowing of the left neural foramen. No other stenosis.  L3-L4: Worsened disc degeneration with left foraminal protrusion and annular fissure in close proximity to the exiting left L3 nerve root and causing mild foraminal stenosis. No spinal canal stenosis. Normal facets.  L4-L5: Mild disc desiccation with right foraminal disc protrusion that is worsened from the prior study. This causes severe right neural foraminal stenosis. Mild left foraminal stenosis. No spinal canal stenosis. Mild right facet hypertrophy.  L5-S1: Disc desiccation with minimal bulge. No stenosis.  IMPRESSION: 1. L4-L5 right foraminal disc protrusion, new/worsened from the prior study, causing severe right neural foraminal stenosis and likely irritating the right L4 nerve root. 2. L2-L3 and L3-L4 worsened degenerative disc disease with left-sided bulges/protrusions causing mild-to-moderate left foraminal stenosis and possible irritation of the exiting nerve roots.   Electronically Signed By: Ulyses Jarred M.D. On: 01/04/2017 22:52  She reports that she has been smoking.  She has a 17.50 pack-year smoking history. she has never used smokeless tobacco.  Recent Labs    05/03/16 0853 10/19/16 0935  HGBA1C 5.9 5.8    Objective:  VS:  HT:    WT:   BMI:      BP:   HR: bpm  TEMP: ( )  RESP:  Physical Exam  Musculoskeletal:  Inspection reveals no atrophy of the bilateral APB or FDI or hand intrinsics. There is no swelling, color changes, allodynia or dystrophic changes. There is 5 out of 5 strength in the bilateral wrist extension, finger abduction and long finger flexion. There is intact sensation to light touch in all dermatomal and peripheral nerve distributions. There is a negative Froment's test bilaterally.  There is an equivocally positive Tinel's test at the right elbow.  There is a negative Phalen's test bilaterally. There is a negative Hoffmann's test bilaterally.    Ortho Exam Imaging: No results found.  Past Medical/Family/Surgical/Social History: Medications & Allergies reviewed per EMR Patient Active Problem List   Diagnosis Date Noted  . GERD (gastroesophageal reflux disease) 10/19/2016  . Prediabetes 05/03/2016  . Cubital tunnel syndrome on right 03/01/2016  . Numbness and tingling of right arm 01/01/2016  . Chlamydia infection 01/27/2015  . Smoking 12/26/2013  . Chronic lower back pain 04/13/2013  . Sciatica 04/13/2013  . Chronic neck pain 04/13/2013  . Spondylosis of cervical spine 04/13/2013   Past Medical History:  Diagnosis Date  . Acid reflux   . Carpal tunnel syndrome   . Diverticulitis   . Low back pain   . 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 Sciatica   . Seasonal allergies    Family History  Problem Relation Age of Onset  . Healthy Mother   . Other Father        Unsure of medical history  . Asthma Maternal Aunt   . Diabetes Maternal Aunt   . Cancer Maternal Aunt   . Breast cancer Maternal Aunt   . Hypertension Maternal Grandmother   . Colon cancer Neg Hx   . Pancreatic cancer Neg Hx   . Rectal cancer Neg Hx   . Stomach cancer Neg Hx  Past Surgical History:  Procedure Laterality Date  . ABDOMINAL HYSTERECTOMY    . ABDOMINAL SURGERY    . ANKLE SURGERY     lt.  . right breast cyst removed      . uretherl     uretrral diverticultis   Social History   Occupational History  . Occupation: Unemployed  Tobacco Use  . Smoking status: Current Every Day Smoker    Packs/day: 0.50    Years: 35.00    Pack years: 17.50  . Smokeless tobacco: Never Used  Substance and Sexual Activity  . Alcohol use: Yes    Comment: Social only - seldom  . Drug use: No  . Sexual activity: No

## 2017-02-17 NOTE — Progress Notes (Deleted)
Pt states numbness and tingling in right hand (pinky and right finger). Pt states symptoms has been going on for awhile. Pt is right hand dominant. No lotions or creams. Pt states massaging right elbow makes the numbness and tingling worse, nothing makes it better.

## 2017-02-18 ENCOUNTER — Other Ambulatory Visit: Payer: Self-pay | Admitting: Internal Medicine

## 2017-02-21 NOTE — Procedures (Signed)
EMG & NCV Findings: All nerve conduction studies (as indicated in the following tables) were within normal limits.    All examined muscles (as indicated in the following table) showed no evidence of electrical instability.    Impression: Essentially NORMAL electrodiagnostic study of the left upper limb.  There is no significant electrodiagnostic evidence of nerve entrapment, brachial plexopathy or cervical radiculopathy.    As you know, purely sensory or demyelinating radiculopathies and chemical radiculitis may not be detected with this particular electrodiagnostic study.  Recommendations: 1.  Follow-up with referring physician. 2.  Continue current management of symptoms.   Nerve Conduction Studies Anti Sensory Summary Table   Stim Site NR Peak (ms) Norm Peak (ms) P-T Amp (V) Norm P-T Amp Site1 Site2 Delta-P (ms) Dist (cm) Vel (m/s) Norm Vel (m/s)  Right Median Acr Palm Anti Sensory (2nd Digit)  30.8C  Wrist    3.6 <3.6 31.8 >10 Wrist Palm 1.8 0.0    Palm    1.8 <2.0 25.1         Right Radial Anti Sensory (Base 1st Digit)  31.9C  Wrist    2.3 <3.1 15.6  Wrist Base 1st Digit 2.3 0.0    Right Ulnar Anti Sensory (5th Digit)  31.3C  Wrist    3.3 <3.7 21.3 >15.0 Wrist 5th Digit 3.3 14.0 42 >38   Motor Summary Table   Stim Site NR Onset (ms) Norm Onset (ms) O-P Amp (mV) Norm O-P Amp Site1 Site2 Delta-0 (ms) Dist (cm) Vel (m/s) Norm Vel (m/s)  Right Median Motor (Abd Poll Brev)  32C  Wrist    3.4 <4.2 10.0 >5 Elbow Wrist 3.9 21.0 54 >50  Elbow    7.3  9.2         Right Ulnar Motor (Abd Dig Min)  32C  Wrist    2.8 <4.2 7.1 >3 B Elbow Wrist 3.4 19.5 57 >53  B Elbow    6.2  9.3  A Elbow B Elbow 1.0 9.0 90 >53  A Elbow    7.2  10.2          EMG   Side Muscle Nerve Root Ins Act Fibs Psw Amp Dur Poly Recrt Int Fraser Din Comment  Right 1stDorInt Ulnar C8-T1 Nml Nml Nml Nml Nml 0 Nml Nml   Right Abd Poll Brev Median C8-T1 Nml Nml Nml Nml Nml 0 Nml Nml   Right ExtDigCom   Nml Nml Nml Nml  Nml 0 Nml Nml   Right Triceps Radial C6-7-8 Nml Nml Nml Nml Nml 0 Nml Nml   Right Deltoid Axillary C5-6 Nml Nml Nml Nml Nml 0 Nml Nml     Nerve Conduction Studies Anti Sensory Left/Right Comparison   Stim Site L Lat (ms) R Lat (ms) L-R Lat (ms) L Amp (V) R Amp (V) L-R Amp (%) Site1 Site2 L Vel (m/s) R Vel (m/s) L-R Vel (m/s)  Median Acr Palm Anti Sensory (2nd Digit)  30.8C  Wrist  3.6   31.8  Wrist Palm     Palm  1.8   25.1        Radial Anti Sensory (Base 1st Digit)  31.9C  Wrist  2.3   15.6  Wrist Base 1st Digit     Ulnar Anti Sensory (5th Digit)  31.3C  Wrist  3.3   21.3  Wrist 5th Digit  42    Motor Left/Right Comparison   Stim Site L Lat (ms) R Lat (ms) L-R Lat (ms) L Amp (mV)  R Amp (mV) L-R Amp (%) Site1 Site2 L Vel (m/s) R Vel (m/s) L-R Vel (m/s)  Median Motor (Abd Poll Brev)  32C  Wrist  3.4   10.0  Elbow Wrist  54   Elbow  7.3   9.2        Ulnar Motor (Abd Dig Min)  32C  Wrist  2.8   7.1  B Elbow Wrist  57   B Elbow  6.2   9.3  A Elbow B Elbow  90   A Elbow  7.2   10.2           Waveforms:

## 2017-03-15 MED FILL — ?OMEPRAZOLE DR 20MG CAPSULE: 20 | 30 days supply | Qty: 30 | Fill #2

## 2017-03-18 ENCOUNTER — Ambulatory Visit (INDEPENDENT_AMBULATORY_CARE_PROVIDER_SITE_OTHER): Payer: Medicaid Other | Admitting: Specialist

## 2017-03-18 ENCOUNTER — Encounter (INDEPENDENT_AMBULATORY_CARE_PROVIDER_SITE_OTHER): Payer: Self-pay | Admitting: Specialist

## 2017-03-18 VITALS — Ht 66.0 in | Wt 151.0 lb

## 2017-03-18 DIAGNOSIS — M4722 Other spondylosis with radiculopathy, cervical region: Secondary | ICD-10-CM

## 2017-03-18 DIAGNOSIS — M5136 Other intervertebral disc degeneration, lumbar region: Secondary | ICD-10-CM

## 2017-03-18 DIAGNOSIS — M48062 Spinal stenosis, lumbar region with neurogenic claudication: Secondary | ICD-10-CM | POA: Diagnosis not present

## 2017-03-18 DIAGNOSIS — G5621 Lesion of ulnar nerve, right upper limb: Secondary | ICD-10-CM

## 2017-03-18 MED ORDER — DICLOFENAC SODIUM 1 % TD GEL: 2.0000 g | Freq: Four times a day (QID) | TRANSDERMAL | 2 refills | Status: AC

## 2017-03-18 MED ORDER — DICLOFENAC SODIUM 50 MG PO TBEC: 50.0000 mg | DELAYED_RELEASE_TABLET | Freq: Two times a day (BID) | ORAL | 1 refills | Status: DC

## 2017-03-18 MED FILL — VOLTAREN 1% GEL: 1 | 12 days supply | Qty: 100 | Fill #0

## 2017-03-18 NOTE — Progress Notes (Signed)
Office Visit Note   Patient: Cheryl King           Date of Birth: 1965/07/21           MRN: [id number] Visit Date: 03/18/2017              Requested by: Cheryl Rakes, MD [place], [place] [address] PCP: Cheryl Rakes, MD   Assessment & Plan: Visit Diagnoses:  1. Ulnar neuropathy at wrist, right   2. Degenerative disc disease, lumbar   3. Spinal stenosis of lumbar region with neurogenic claudication   4. Other spondylosis with radiculopathy, cervical region     Plan:Avoid frequent bending and stooping  No lifting greater than 10 lbs. May use ice or moist heat for pain. Weight loss is of benefit. Handicap license is approved.  Avoid overhead lifting and overhead use of the arms. Do not lift greater than 5 lbs. Adjust head rest in vehicle to prevent hyperextension if rear ended. Take extra precautions to avoid falling.  Grip strengthening arms. Voltaren gel for joint pain and muscular pains. Voltaren 50 mg po up to TID. I think surgery is likely to be more painful and limiting than your current condition. Vitamin B complex Fish oil daily. Vitamin B6, O67, Folic acid and Thiamine are important vitamins to take to help with nerve stability.   Repeat EMG/NCV in 6 months or one year.   Follow-Up Instructions: No Follow-up on file.   Orders:  No orders of the defined types were placed in this encounter.  No orders of the defined types were placed in this encounter.     Procedures: No procedures performed   Clinical Data: Findings:  MRi shows right L4-5 foramenal narrowing due to disc protrusion and left L2-3 and Left L3-4 foramenal narrowing due to spondylosis.  EMG/NCV both arms with very mild slowing at the wrist of the ulnar nerve.    Subjective: Chief Complaint  Patient presents with  . Right Hand - Follow-up    Follow up EMG/NCS  . Lower Back - Follow-up    51 year old right handed female with neck pain and complaints of  numbness, and tingling into the right hand not the left hand as much. She has pain with flexion and extension of the neck and head, mostly with extension. She has no pain with cough or sneeze. She drives well with some pain with turning her head.  She has strengthen deficits, using both of arn to lift and raise Items. In [place] with difficulty even holding things in her hands, October or November 2018. She has to use a can to walk some with pain radiating into into the right upper buttock.No bowel or bladder difficulty. Standing and walking with  Pain into both lateral thighs and pain in the knees ? Arthritis in my knees.    Review of Systems  Constitutional: Positive for activity change, fever and unexpected weight change. Negative for appetite change, chills, diaphoresis and fatigue.  HENT: Positive for congestion, rhinorrhea, sinus pressure and sinus pain.   Eyes: Positive for visual disturbance. Negative for photophobia, pain, discharge, redness and itching.  Respiratory: Positive for cough. Negative for apnea, choking, chest tightness, shortness of breath, wheezing and stridor.   Cardiovascular: Negative.  Negative for chest pain, palpitations and leg swelling.  Gastrointestinal: Negative.  Negative for abdominal distention, abdominal pain, anal bleeding, blood in stool and constipation.  Endocrine: Negative.  Negative for cold intolerance, heat intolerance, polydipsia, polyphagia and polyuria.  Genitourinary: Negative.  Negative for difficulty urinating, dyspareunia, dysuria, enuresis, flank pain, frequency and hematuria.  Musculoskeletal: Positive for back pain, gait problem, joint swelling, neck pain and neck stiffness. Negative for arthralgias.  Skin: Negative.   Allergic/Immunologic: Negative.  Negative for environmental allergies, food allergies and immunocompromised state.  Neurological: Positive for weakness and numbness. Negative for dizziness, tremors, seizures, syncope, facial asymmetry,  speech difficulty, light-headedness and headaches.  Hematological: Negative.  Does not bruise/bleed easily.  Psychiatric/Behavioral: Negative.  Negative for agitation, behavioral problems, confusion, decreased concentration, dysphoric mood, hallucinations, self-injury and sleep disturbance. The patient is not nervous/anxious and is not hyperactive.      Objective: Vital Signs: Ht 5\' 6"  (1.676 m)   Wt 151 lb (68.5 kg)   BMI 24.37 kg/m   Physical Exam  Constitutional: She is oriented to person, place, and time. She appears well-developed and well-nourished.  HENT:  Head: Normocephalic and atraumatic.  Eyes: EOM are normal. Pupils are equal, round, and reactive to light.  Neck: Normal range of motion. Neck supple.  Pulmonary/Chest: Effort normal and breath sounds normal.  Abdominal: Soft. Bowel sounds are normal.  Musculoskeletal: Normal range of motion.  Neurological: She is alert and oriented to person, place, and time.  Skin: Skin is warm and dry.  Psychiatric: She has a normal mood and affect. Her behavior is normal. Judgment and thought content normal.    Back Exam   Tenderness  The patient is experiencing tenderness in the lumbar and cervical.  Range of Motion  Extension: normal  Flexion: normal  Lateral bend right: normal  Lateral bend left: normal  Rotation right: normal  Rotation left: normal   Muscle Strength  Right Quadriceps:  5/5  Left Quadriceps:  5/5  Right Hamstrings:  5/5  Left Hamstrings:  5/5   Tests  Straight leg raise right: negative Straight leg raise left: negative  Reflexes  Patellar: normal Achilles: normal Biceps: normal Babinski's sign: normal   Other  Toe walk: normal Heel walk: normal Sensation: normal Gait: normal  Erythema: no back redness Scars: absent  Comments:  Decreased ROM Cervical spine,        Specialty Comments:  No specialty comments available.  Imaging: No results found.   PMFS History: Patient Active  Problem List   Diagnosis Date Noted  . GERD (gastroesophageal reflux disease) 10/19/2016  . Prediabetes 05/03/2016  . Cubital tunnel syndrome on right 03/01/2016  . Numbness and tingling of right arm 01/01/2016  . Chlamydia infection 01/27/2015  . Smoking 12/26/2013  . Chronic lower back pain 04/13/2013  . Sciatica 04/13/2013  . Chronic neck pain 04/13/2013  . Spondylosis of cervical spine 04/13/2013   Past Medical History:  Diagnosis Date  . Acid reflux   . Carpal tunnel syndrome   . Diverticulitis   . Low back pain   . 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 Sciatica   . Seasonal allergies     Family History  Problem Relation Age of Onset  . Healthy Mother   . Other Father        Unsure of medical history  . Asthma Maternal Aunt   . Diabetes Maternal Aunt   . Cancer Maternal Aunt   . Breast cancer Maternal Aunt   . Hypertension Maternal Grandmother   . Colon cancer Neg Hx   . Pancreatic cancer Neg Hx   . Rectal cancer Neg Hx   . Stomach cancer Neg Hx     Past Surgical History:  Procedure Laterality Date  .  ABDOMINAL HYSTERECTOMY    . ABDOMINAL SURGERY    . ANKLE SURGERY     lt.  . right breast cyst removed     . uretherl     uretrral diverticultis   Social History   Occupational History  . Occupation: Unemployed  Tobacco Use  . Smoking status: Current Every Day Smoker    Packs/day: 0.50    Years: 35.00    Pack years: 17.50  . Smokeless tobacco: Never Used  Substance and Sexual Activity  . Alcohol use: Yes    Comment: Social only - seldom  . Drug use: No  . Sexual activity: No

## 2017-03-18 NOTE — Patient Instructions (Signed)
Avoid frequent bending and stooping  No lifting greater than 10 lbs. May use ice or moist heat for pain. Weight loss is of benefit. Handicap license is approved.  Avoid overhead lifting and overhead use of the arms. Do not lift greater than 5 lbs. Adjust head rest in vehicle to prevent hyperextension if rear ended. Take extra precautions to avoid falling.  Grip strengthening arms. Voltaren gel for joint pain and muscular pains. Voltaren 50 mg po up to TID. I think surgery is likely to be more painful and limiting than your current condition. Vitamin B complex Fish oil daily. Vitamin B6, X41, Folic acid and Thiamine are important vitamins to take to help with nerve stability.   Repeat EMG/NCV in 6 months or one year.

## 2017-03-22 ENCOUNTER — Other Ambulatory Visit: Payer: Self-pay | Admitting: Family Medicine

## 2017-03-22 DIAGNOSIS — G8929 Other chronic pain: Secondary | ICD-10-CM

## 2017-03-22 DIAGNOSIS — M5442 Lumbago with sciatica, left side: Principal | ICD-10-CM

## 2017-03-22 MED FILL — DULoxetine HCL 60 MG CPEP: 60 | 30 days supply | Qty: 30 | Fill #2

## 2017-03-22 MED FILL — ATORVASTATIN 20 MG TABLET: 20 | 30 days supply | Qty: 30 | Fill #2

## 2017-03-23 MED FILL — CYCLOBENZAPRINE 10 MG TAB: 10 | 30 days supply | Qty: 90 | Fill #0

## 2017-04-07 MED FILL — FLUTICASONE PROP 50 MCG SPR: 50 | 30 days supply | Qty: 16 | Fill #2

## 2017-04-08 MED FILL — CETIRIZINE HCL 10 MG TABS: 10 | 30 days supply | Qty: 30 | Fill #2

## 2017-04-08 MED FILL — OMEPRAZOLE DR 20 MG CAPSULE: 20 | 30 days supply | Qty: 30 | Fill #3

## 2017-04-12 ENCOUNTER — Other Ambulatory Visit (INDEPENDENT_AMBULATORY_CARE_PROVIDER_SITE_OTHER): Payer: Self-pay | Admitting: Specialist

## 2017-04-12 ENCOUNTER — Other Ambulatory Visit: Payer: Self-pay | Admitting: Family Medicine

## 2017-04-12 DIAGNOSIS — M5136 Other intervertebral disc degeneration, lumbar region: Secondary | ICD-10-CM

## 2017-04-12 DIAGNOSIS — M542 Cervicalgia: Secondary | ICD-10-CM

## 2017-04-12 DIAGNOSIS — M5442 Lumbago with sciatica, left side: Principal | ICD-10-CM

## 2017-04-12 DIAGNOSIS — G8929 Other chronic pain: Secondary | ICD-10-CM

## 2017-04-12 NOTE — Telephone Encounter (Signed)
Tylenol #3 refill request 

## 2017-04-13 MED FILL — ACETAMINOPHEN/COD #3 TABLET: 300-30 | 7 days supply | Qty: 21 | Fill #0

## 2017-04-13 NOTE — Telephone Encounter (Signed)
Called rx to [facility]

## 2017-04-18 ENCOUNTER — Ambulatory Visit: Payer: Medicaid Other | Attending: Family Medicine | Admitting: Family Medicine

## 2017-04-18 ENCOUNTER — Encounter: Payer: Self-pay | Admitting: Family Medicine

## 2017-04-18 ENCOUNTER — Other Ambulatory Visit: Payer: Self-pay | Admitting: Family Medicine

## 2017-04-18 VITALS — BP 107/67 | HR 68 | Temp 98.1°F | Ht 66.0 in | Wt 151.6 lb

## 2017-04-18 DIAGNOSIS — G5620 Lesion of ulnar nerve, unspecified upper limb: Secondary | ICD-10-CM | POA: Insufficient documentation

## 2017-04-18 DIAGNOSIS — R51 Headache: Secondary | ICD-10-CM | POA: Diagnosis not present

## 2017-04-18 DIAGNOSIS — R0982 Postnasal drip: Secondary | ICD-10-CM

## 2017-04-18 DIAGNOSIS — R7303 Prediabetes: Secondary | ICD-10-CM | POA: Insufficient documentation

## 2017-04-18 DIAGNOSIS — M5442 Lumbago with sciatica, left side: Secondary | ICD-10-CM | POA: Diagnosis not present

## 2017-04-18 DIAGNOSIS — K219 Gastro-esophageal reflux disease without esophagitis: Secondary | ICD-10-CM | POA: Insufficient documentation

## 2017-04-18 DIAGNOSIS — Z7951 Long term (current) use of inhaled steroids: Secondary | ICD-10-CM | POA: Diagnosis not present

## 2017-04-18 DIAGNOSIS — Z1231 Encounter for screening mammogram for malignant neoplasm of breast: Secondary | ICD-10-CM

## 2017-04-18 DIAGNOSIS — E7800 Pure hypercholesterolemia, unspecified: Secondary | ICD-10-CM | POA: Insufficient documentation

## 2017-04-18 DIAGNOSIS — G5621 Lesion of ulnar nerve, right upper limb: Secondary | ICD-10-CM | POA: Diagnosis not present

## 2017-04-18 DIAGNOSIS — Z79899 Other long term (current) drug therapy: Secondary | ICD-10-CM | POA: Diagnosis not present

## 2017-04-18 DIAGNOSIS — G8929 Other chronic pain: Secondary | ICD-10-CM | POA: Insufficient documentation

## 2017-04-18 DIAGNOSIS — Z1211 Encounter for screening for malignant neoplasm of colon: Secondary | ICD-10-CM

## 2017-04-18 DIAGNOSIS — R519 Headache, unspecified: Secondary | ICD-10-CM

## 2017-04-18 DIAGNOSIS — G5600 Carpal tunnel syndrome, unspecified upper limb: Secondary | ICD-10-CM | POA: Insufficient documentation

## 2017-04-18 MED ORDER — FLUTICASONE PROPIONATE 50 MCG/ACT NA SUSP: 2.0000 | Freq: Every day | NASAL | 3 refills | Status: DC

## 2017-04-18 MED ORDER — BUTALBITAL-APAP-CAFFEINE 50-325-40 MG PO TABS: 1.0000 | ORAL_TABLET | Freq: Two times a day (BID) | ORAL | 1 refills | Status: AC | PRN

## 2017-04-18 MED ORDER — CYCLOBENZAPRINE HCL 10 MG PO TABS: 10.0000 mg | ORAL_TABLET | Freq: Two times a day (BID) | ORAL | 2 refills | Status: DC | PRN

## 2017-04-18 MED ORDER — CETIRIZINE HCL 10 MG PO TABS: 10.0000 mg | ORAL_TABLET | Freq: Every day | ORAL | 1 refills | Status: AC

## 2017-04-18 MED ORDER — DULOXETINE HCL 60 MG PO CPEP: 60.0000 mg | ORAL_CAPSULE | Freq: Every day | ORAL | 1 refills | Status: DC

## 2017-04-18 MED ORDER — OMEPRAZOLE 20 MG PO CPDR: 20.0000 mg | DELAYED_RELEASE_CAPSULE | Freq: Every day | ORAL | 1 refills | Status: DC

## 2017-04-18 MED ORDER — PREGABALIN 50 MG PO CAPS: 50.0000 mg | ORAL_CAPSULE | Freq: Every day | ORAL | 3 refills | Status: DC

## 2017-04-18 MED ORDER — ATORVASTATIN CALCIUM 20 MG PO TABS: 20.0000 mg | ORAL_TABLET | Freq: Every day | ORAL | 1 refills | Status: DC

## 2017-04-18 MED FILL — ATORVASTATIN 20 MG TABLET: 20 | 30 days supply | Qty: 30 | Fill #0

## 2017-04-18 MED FILL — CYCLOBENZAPRINE 10 MG TAB: 10 | 30 days supply | Qty: 60 | Fill #0

## 2017-04-18 MED FILL — BUTALB-ACETAMIN-CAFF 50-325: 50-325-40 | 30 days supply | Qty: 60 | Fill #0

## 2017-04-18 MED FILL — DULoxetine HCL 60 MG CPEP: 60 | 30 days supply | Qty: 30 | Fill #0

## 2017-04-18 NOTE — Progress Notes (Signed)
Subjective:  Patient ID: Cheryl King, female    DOB: 27-Aug-1965  Age: 52 y.o. MRN: [id number]  CC: Sciatica   HPI Cheryl King  is 52 year old female with Medical history  significant for Prediabetes (diet controlled A1c 5.8) chronic low back pain with sciatica, GERD, hyperlipidemia here for a follow-up visit.  Doing well on omeprazole which she takes for reflux and denies adverse effects. She endorses compliance with her statin and denies myalgias. Her headaches are controlled on Fioricet and sinus symptoms have been stable.  Denies nausea, blurry vision and has no prior history of migraines.  Her low back pain is managed by orthopedics and she is status post epidural spinal injections in the past by Dr Louanne Skye whom she also saw 3 weeks ago for management of right ulnar neuropathy and states pain is worse when she bends her wrist or does heavy lifting.  EMG and nerve conduction studies performed by Dr. Ernestina Patches revealed very mild slowing at the wrist as per Ortho notes.Her lifting has been restricted by her orthopedics to 10 pounds  She has no additional concerns today  With regard to healthcare maintenance she states she had a cologuard test last year the mammogram center which was negative.  States in the past she was unable to tolerate the bowel prep medication prior to colonoscopy and threw up but is willing to be referred again.  Past Medical History:  Diagnosis Date  . Acid reflux   . Carpal tunnel syndrome   . Diverticulitis   . Low back pain   . 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 Sciatica   . Seasonal allergies     Past Surgical History:  Procedure Laterality Date  . ABDOMINAL HYSTERECTOMY    . ABDOMINAL SURGERY    . ANKLE SURGERY     lt.  . right breast cyst removed     . uretherl     uretrral diverticultis    Allergies  Allergen Reactions  . Percocet [Oxycodone-Acetaminophen] Nausea Only  . Vicodin [Hydrocodone-Acetaminophen] Nausea Only  . Penicillins  Rash     Outpatient Medications Prior to Visit  Medication Sig Dispense Refill  . acetaminophen-codeine (TYLENOL #3) 300-30 MG tablet TAKE 1 TABLET BY MOUTH EVERY 8 HOURS AS NEEDED FOR MODERATE PAIN 30 tablet 0  . diclofenac (VOLTAREN) 50 MG EC tablet Take 1 tablet (50 mg total) by mouth 2 (two) times daily. 180 tablet 1  . diclofenac sodium (VOLTAREN) 1 % GEL Apply 2 g topically 4 (four) times daily. 5 Tube 2  . Lactobacillus CAPS Take 1 capsule by mouth daily.    . Multiple Vitamin (MULTIVITAMIN WITH MINERALS) TABS tablet Take 1 tablet by mouth daily.    . ondansetron (ZOFRAN) 4 MG tablet TAKE 1 TABLET BY MOUTH EVERY 8 HOURS AS NEEDED FOR NAUSEA OR VOMITING. 60 tablet 1  . atorvastatin (LIPITOR) 20 MG tablet Take 1 tablet (20 mg total) by mouth daily. 90 tablet 1  . butalbital-acetaminophen-caffeine (FIORICET, ESGIC) 50-325-40 MG tablet Take 1 tablet by mouth every 12 (twelve) hours as needed for headache. 60 tablet 1  . cetirizine (ZYRTEC) 10 MG tablet Take 1 tablet (10 mg total) by mouth daily. 90 tablet 1  . cyclobenzaprine (FLEXERIL) 10 MG tablet TAKE 1 TABLET BY MOUTH 3 TIMES DAILY AS NEEDED FOR MUSCLE SPASMS. 90 tablet 1  . DULoxetine (CYMBALTA) 60 MG capsule Take 1 capsule (60 mg total) by mouth daily. 90 capsule 1  . fluticasone (FLONASE) 50 MCG/ACT nasal spray Place  2 sprays into both nostrils daily. 16 g 3  . omeprazole (PRILOSEC) 20 MG capsule Take 1 capsule (20 mg total) by mouth daily. 90 capsule 1  . pregabalin (LYRICA) 50 MG capsule Take 1 capsule (50 mg total) by mouth at bedtime. 30 capsule 3  . CHANTIX CONTINUING MONTH PAK 1 MG tablet TAKE 1 TABLET BY MOUTH TWICE A DAY (Patient not taking: Reported on 04/18/2017) 56 tablet 0   No facility-administered medications prior to visit.     ROS Review of Systems  Constitutional: Negative for activity change, appetite change and fatigue.  HENT: Negative for congestion, sinus pressure and sore throat.   Eyes: Negative for visual  disturbance.  Respiratory: Negative for cough, chest tightness, shortness of breath and wheezing.   Cardiovascular: Negative for chest pain and palpitations.  Gastrointestinal: Negative for abdominal distention, abdominal pain and constipation.  Endocrine: Negative for polydipsia.  Genitourinary: Negative for dysuria and frequency.  Musculoskeletal:       See hpi  Skin: Negative for rash.  Neurological: Negative for tremors, light-headedness and numbness.  Hematological: Does not bruise/bleed easily.  Psychiatric/Behavioral: Negative for agitation and behavioral problems.    Objective:  BP 107/67   Pulse 68   Temp 98.1 F (36.7 C) (Oral)   Ht 5' 6"  (1.676 m)   Wt 151 lb 9.6 oz (68.8 kg)   SpO2 96%   BMI 24.47 kg/m   BP/Weight 04/18/2017 03/18/2017 0/17/7939  Systolic BP 030 - 98  Diastolic BP 67 - 64  Wt. (Lbs) 151.6 151 151  BMI 24.47 24.37 23.65      Physical Exam  Constitutional: She is oriented to person, place, and time. She appears well-developed and well-nourished.  Cardiovascular: Normal rate, normal heart sounds and intact distal pulses.  No murmur heard. Pulmonary/Chest: Effort normal and breath sounds normal. She has no wheezes. She has no rales. She exhibits no tenderness.  Abdominal: Soft. Bowel sounds are normal. She exhibits no distension and no mass. There is no tenderness.  Musculoskeletal:  Normal appearance of both wrist Tenderness on active flexion and extension of right wrist Lumbar spine appears normal, no tenderness to palpation; negative straight leg raise bilaterally  Neurological: She is alert and oriented to person, place, and time.  Skin: Skin is warm and dry.  Psychiatric: She has a normal mood and affect.     CMP Latest Ref Rng & Units 01/17/2017 05/03/2016 04/13/2013  Glucose 65 - 99 mg/dL 116(H) 81 76  BUN 6 - 24 mg/dL 19 18 18   Creatinine 0.57 - 1.00 mg/dL 0.74 0.74 0.74  Sodium 134 - 144 mmol/L 141 143 137  Potassium 3.5 - 5.2 mmol/L  4.3 4.9 4.3  Chloride 96 - 106 mmol/L 103 103 101  CO2 20 - 29 mmol/L 25 25 29   Calcium 8.7 - 10.2 mg/dL 9.7 9.8 10.0  Total Protein 6.0 - 8.5 g/dL 6.9 - 7.3  Total Bilirubin 0.0 - 1.2 mg/dL 0.3 - 0.4  Alkaline Phos 39 - 117 IU/L 99 - 75  AST 0 - 40 IU/L 22 - 21  ALT 0 - 32 IU/L 17 - 12    Lipid Panel     Component Value Date/Time   CHOL 170 07/16/2016 1001   TRIG 74 07/16/2016 1001   HDL 53 07/16/2016 1001   CHOLHDL 3.2 07/16/2016 1001   CHOLHDL 3.2 12/26/2013 1319   VLDL 18 12/26/2013 1319   LDLCALC 102 (H) 07/16/2016 1001    Lab Results  Component Value  Date   HGBA1C 5.8 10/19/2016     Assessment & Plan:   1. Chronic midline low back pain with left-sided sciatica Stable - pregabalin (LYRICA) 50 MG capsule; Take 1 capsule (50 mg total) by mouth at bedtime.  Dispense: 90 capsule; Refill: 3 - cyclobenzaprine (FLEXERIL) 10 MG tablet; Take 1 tablet (10 mg total) by mouth 2 (two) times daily as needed for muscle spasms.  Dispense: 60 tablet; Refill: 2 - DULoxetine (CYMBALTA) 60 MG capsule; Take 1 capsule (60 mg total) by mouth daily.  Dispense: 90 capsule; Refill: 1  2. Pure hypercholesterolemia - CMP14+EGFR - Lipid panel - atorvastatin (LIPITOR) 20 MG tablet; Take 1 tablet (20 mg total) by mouth daily.  Dispense: 90 tablet; Refill: 1  3. Sinus headache Controlled - butalbital-acetaminophen-caffeine (FIORICET, ESGIC) 50-325-40 MG tablet; Take 1 tablet by mouth every 12 (twelve) hours as needed for headache.  Dispense: 60 tablet; Refill: 1  4. Post-nasal drip Stable - cetirizine (ZYRTEC) 10 MG tablet; Take 1 tablet (10 mg total) by mouth daily.  Dispense: 90 tablet; Refill: 1 - fluticasone (FLONASE) 50 MCG/ACT nasal spray; Place 2 sprays into both nostrils daily.  Dispense: 16 g; Refill: 3  5. Gastroesophageal reflux disease without esophagitis Controlled - CBC with Differential/Platelet - omeprazole (PRILOSEC) 20 MG capsule; Take 1 capsule (20 mg total) by mouth  daily.  Dispense: 90 capsule; Refill: 1  6. Prediabetes A1c 5.8  7. Screening for colon cancer - Ambulatory referral to Gastroenterology  8. Ulnar neuropathy of right upper extremity Continue with wrist brace, Voltaren gel Received Tylenol 3 from orthopedics Follow-up with Dr. Louanne Skye   Meds ordered this encounter  Medications  . pregabalin (LYRICA) 50 MG capsule    Sig: Take 1 capsule (50 mg total) by mouth at bedtime.    Dispense:  90 capsule    Refill:  3  . atorvastatin (LIPITOR) 20 MG tablet    Sig: Take 1 tablet (20 mg total) by mouth daily.    Dispense:  90 tablet    Refill:  1    90 day supply  . butalbital-acetaminophen-caffeine (FIORICET, ESGIC) 50-325-40 MG tablet    Sig: Take 1 tablet by mouth every 12 (twelve) hours as needed for headache.    Dispense:  60 tablet    Refill:  1  . cetirizine (ZYRTEC) 10 MG tablet    Sig: Take 1 tablet (10 mg total) by mouth daily.    Dispense:  90 tablet    Refill:  1  . cyclobenzaprine (FLEXERIL) 10 MG tablet    Sig: Take 1 tablet (10 mg total) by mouth 2 (two) times daily as needed for muscle spasms.    Dispense:  60 tablet    Refill:  2  . DULoxetine (CYMBALTA) 60 MG capsule    Sig: Take 1 capsule (60 mg total) by mouth daily.    Dispense:  90 capsule    Refill:  1    90 day supply  . fluticasone (FLONASE) 50 MCG/ACT nasal spray    Sig: Place 2 sprays into both nostrils daily.    Dispense:  16 g    Refill:  3  . omeprazole (PRILOSEC) 20 MG capsule    Sig: Take 1 capsule (20 mg total) by mouth daily.    Dispense:  90 capsule    Refill:  1    Follow-up: Return in about 3 months (around 07/18/2017) for Follow-up of chronic medical conditions.   Charlott Rakes MD

## 2017-04-18 NOTE — Progress Notes (Signed)
Patient needs a new script for Lyrica for PASS.

## 2017-04-19 LAB — CMP14+EGFR
ALBUMIN: 4.3 g/dL (ref 3.5–5.5)
ALT: 19 IU/L (ref 0–32)
AST: 22 IU/L (ref 0–40)
Albumin/Globulin Ratio: 1.6 (ref 1.2–2.2)
Alkaline Phosphatase: 111 IU/L (ref 39–117)
BUN / CREAT RATIO: 21 (ref 9–23)
BUN: 14 mg/dL (ref 6–24)
Bilirubin Total: 0.3 mg/dL (ref 0.0–1.2)
CALCIUM: 9.9 mg/dL (ref 8.7–10.2)
CO2: 27 mmol/L (ref 20–29)
CREATININE: 0.67 mg/dL (ref 0.57–1.00)
Chloride: 102 mmol/L (ref 96–106)
GFR calc Af Amer: 116 mL/min/{1.73_m2} (ref >59)
GFR, EST NON AFRICAN AMERICAN: 101 mL/min/{1.73_m2} (ref >59)
GLOBULIN, TOTAL: 2.7 g/dL (ref 1.5–4.5)
GLUCOSE: 86 mg/dL (ref 65–99)
Potassium: 4.9 mmol/L (ref 3.5–5.2)
SODIUM: 140 mmol/L (ref 134–144)
TOTAL PROTEIN: 7 g/dL (ref 6.0–8.5)

## 2017-04-19 LAB — LIPID PANEL
CHOL/HDL RATIO: 3.3 ratio (ref 0.0–4.4)
CHOLESTEROL TOTAL: 194 mg/dL (ref 100–199)
HDL: 58 mg/dL (ref >39)
LDL CALC: 120 mg/dL — AB (ref 0–99)
TRIGLYCERIDES: 80 mg/dL (ref 0–149)
VLDL CHOLESTEROL CAL: 16 mg/dL (ref 5–40)

## 2017-04-19 LAB — CBC WITH DIFFERENTIAL/PLATELET
BASOS ABS: 0 10*3/uL (ref 0.0–0.2)
BASOS: 0 %
EOS (ABSOLUTE): 0.1 10*3/uL (ref 0.0–0.4)
Eos: 1 %
Hematocrit: 44.7 % (ref 34.0–46.6)
Hemoglobin: 14.9 g/dL (ref 11.1–15.9)
IMMATURE GRANS (ABS): 0 10*3/uL (ref 0.0–0.1)
Immature Granulocytes: 0 %
LYMPHS: 31 %
Lymphocytes Absolute: 2.6 10*3/uL (ref 0.7–3.1)
MCH: 31.4 pg (ref 26.6–33.0)
MCHC: 33.3 g/dL (ref 31.5–35.7)
MCV: 94 fL (ref 79–97)
MONOS ABS: 0.6 10*3/uL (ref 0.1–0.9)
Monocytes: 7 %
NEUTROS ABS: 5.2 10*3/uL (ref 1.4–7.0)
NEUTROS PCT: 61 %
PLATELETS: 236 10*3/uL (ref 150–379)
RBC: 4.74 x10E6/uL (ref 3.77–5.28)
RDW: 14.5 % (ref 12.3–15.4)
WBC: 8.6 10*3/uL (ref 3.4–10.8)

## 2017-04-20 ENCOUNTER — Encounter: Payer: Self-pay | Admitting: Gastroenterology

## 2017-04-26 ENCOUNTER — Ambulatory Visit: Payer: Medicaid Other

## 2017-05-06 ENCOUNTER — Ambulatory Visit
Admission: RE | Admit: 2017-05-06 | Discharge: 2017-05-06 | Disposition: A | Discharge status: Home / Self Care | Payer: Medicaid Other | Source: Ambulatory Visit | Attending: Family Medicine | Admitting: Family Medicine

## 2017-05-06 DIAGNOSIS — Z1231 Encounter for screening mammogram for malignant neoplasm of breast: Secondary | ICD-10-CM | POA: Diagnosis not present

## 2017-05-11 MED FILL — OMEPRAZOLE DR 20 MG CAPSULE: 20 | 30 days supply | Qty: 30 | Fill #4

## 2017-05-12 MED FILL — FLUTICASONE PROP 50 MCG SPR: 50 | 30 days supply | Qty: 16 | Fill #3

## 2017-05-18 ENCOUNTER — Other Ambulatory Visit: Payer: Self-pay

## 2017-05-18 ENCOUNTER — Ambulatory Visit (AMBULATORY_SURGERY_CENTER): Payer: Self-pay | Admitting: *Deleted

## 2017-05-18 ENCOUNTER — Telehealth (INDEPENDENT_AMBULATORY_CARE_PROVIDER_SITE_OTHER): Payer: Self-pay

## 2017-05-18 ENCOUNTER — Encounter (INDEPENDENT_AMBULATORY_CARE_PROVIDER_SITE_OTHER): Payer: Self-pay | Admitting: Specialist

## 2017-05-18 ENCOUNTER — Ambulatory Visit (INDEPENDENT_AMBULATORY_CARE_PROVIDER_SITE_OTHER): Payer: Medicaid Other | Admitting: Specialist

## 2017-05-18 VITALS — Ht 67.0 in | Wt 146.0 lb

## 2017-05-18 VITALS — BP 98/67 | HR 62 | Ht 67.0 in | Wt 147.0 lb

## 2017-05-18 DIAGNOSIS — M5030 Other cervical disc degeneration, unspecified cervical region: Secondary | ICD-10-CM | POA: Diagnosis not present

## 2017-05-18 DIAGNOSIS — M5136 Other intervertebral disc degeneration, lumbar region: Secondary | ICD-10-CM

## 2017-05-18 DIAGNOSIS — Z1211 Encounter for screening for malignant neoplasm of colon: Secondary | ICD-10-CM

## 2017-05-18 MED ORDER — NAPROXEN 500 MG PO TBEC: 500.0000 mg | DELAYED_RELEASE_TABLET | Freq: Two times a day (BID) | ORAL | 3 refills | Status: DC

## 2017-05-18 MED ORDER — PEG-KCL-NACL-NASULF-NA ASC-C 140 G PO SOLR: 1.0000 | ORAL | 0 refills | Status: DC

## 2017-05-18 MED FILL — NAPROXEN 500 MG TABLET: 500 | 30 days supply | Qty: 60 | Fill #0

## 2017-05-18 MED FILL — CYCLOBENZAPRINE 10 MG TAB: 10 | 30 days supply | Qty: 90 | Fill #1

## 2017-05-18 MED FILL — CETIRIZINE HCL 10 MG TABS: 10 | 30 days supply | Qty: 30 | Fill #3

## 2017-05-18 MED FILL — DULoxetine HCL 60 MG CPEP: 60 | 30 days supply | Qty: 30 | Fill #3

## 2017-05-18 MED FILL — ATORVASTATIN 20 MG TABLET: 20 | 30 days supply | Qty: 30 | Fill #3

## 2017-05-18 MED FILL — ACETAMINOPHEN/COD #3 TABLET: 300-30 | 3 days supply | Qty: 9 | Fill #1

## 2017-05-18 NOTE — Patient Instructions (Addendum)
Avoid frequent bending and stooping  No lifting greater than 10 lbs. May use ice or moist heat for pain. Weight loss is of benefit. Handicap license is approved.  Avoid overhead lifting and overhead use of the arms. Do not lift greater than 5 lbs. Adjust head rest in vehicle to prevent hyperextension if rear ended. Take extra precautions to avoid falling

## 2017-05-18 NOTE — Telephone Encounter (Signed)
I spoke with Dr. Louanne Skye and he said it was ok to use regular Naproxen.  I called the pharm and advised Shayla that the change was fine.

## 2017-05-18 NOTE — Telephone Encounter (Signed)
Shayla with [facility] called wanting to know if Rx for Naproxen (EC-Naprosyn 500mg ) can be changed to regular Naproxen, stated that they do not order for EC-Naprosyn.  Cb# is [phone].  Please advise.  Thank you.

## 2017-05-18 NOTE — Progress Notes (Signed)
Office Visit Note   Patient: Cheryl King           Date of Birth: 03/20/1965           MRN: [id number] Visit Date: 05/18/2017              Requested by: Charlott Rakes, MD [place], [place] [address] PCP: Charlott Rakes, MD   Assessment & Plan: Visit Diagnoses:  1. Degenerative disc disease, cervical   2. Degenerative disc disease, lumbar     Plan: Avoid frequent bending and stooping  No lifting greater than 10 lbs. May use ice or moist heat for pain. Weight loss is of benefit. Handicap license is approved.  Avoid overhead lifting and overhead use of the arms. Do not lift greater than 5 lbs. Adjust head rest in vehicle to prevent hyperextension if rear ended. Take extra precautions to avoid falling.  Follow-Up Instructions: Return in about 2 months (around 07/18/2017).   Orders:  No orders of the defined types were placed in this encounter.  Meds ordered this encounter  Medications  . naproxen (EC-NAPROSYN) 500 MG EC tablet    Sig: Take 1 tablet (500 mg total) by mouth 2 (two) times daily with a meal.    Dispense:  60 tablet    Refill:  3      Procedures: No procedures performed   Clinical Data: No additional findings.   Subjective: Chief Complaint  Patient presents with  . Lower Back - Follow-up, Pain    52 year old female right hand dominant followed for problems of neck and lumbar pains. She reports pain this morning into the neck and right arm. Into the right lateral elbow. The right arm is painful but not weak, she is sleeping okay sometimes. Her lower back buttocks and her legs are painful. Pain worse all the time. She has difficullty walking in the grocery Store. She tries to stretch out the back but it still hurts. No bowel or bladder difficutly. She has pain with walking in from the parking lot. She feels the pain. In the past she has taken Diclofenac gel but she reports that medicaid will not pay for the use of diclofenac  tablets. She is not taking any antiinflamatory agents because she didn't know what she could take. The lower back pain is a 10 of 10 and has coming and going but staying constantly for the past one week now. Bending and stooping is painful. Sitting for long periods is painful and  It takes her a minute before she can move after standing. AM able to get out of bed without difficulty. She sleeps okay sometimes, sometimes not.    Review of Systems  Constitutional: Negative.   HENT: Negative.   Eyes: Negative.   Respiratory: Negative.   Cardiovascular: Negative.   Gastrointestinal: Negative.   Endocrine: Negative.   Genitourinary: Negative.   Musculoskeletal: Negative.   Skin: Negative.   Allergic/Immunologic: Negative.   Neurological: Negative.   Hematological: Negative.   Psychiatric/Behavioral: Negative.      Objective: Vital Signs: BP 98/67 (BP Location: Left Arm, Patient Position: Sitting, Cuff Size: Normal)   Pulse 62   Ht 5\' 7"  (1.702 m)   Wt 147 lb (66.7 kg)   BMI 23.02 kg/m   Physical Exam  Constitutional: She is oriented to person, place, and time. She appears well-developed and well-nourished.  HENT:  Head: Normocephalic and atraumatic.  Eyes: Pupils are equal, round, and reactive to light. EOM are  normal.  Neck: Normal range of motion. Neck supple.  Pulmonary/Chest: Effort normal and breath sounds normal.  Abdominal: Soft. Bowel sounds are normal.  Neurological: She is alert and oriented to person, place, and time.  Skin: Skin is warm and dry.  Psychiatric: She has a normal mood and affect. Her behavior is normal. Judgment and thought content normal.    Back Exam   Tenderness  The patient is experiencing tenderness in the cervical and lumbar.  Range of Motion  Extension: abnormal  Flexion: normal  Lateral bend right: abnormal  Lateral bend left: abnormal  Rotation right: abnormal  Rotation left: abnormal   Muscle Strength  Right Quadriceps:  5/5    Left Quadriceps:  5/5  Right Hamstrings:  5/5  Left Hamstrings:  5/5   Tests  Straight leg raise right: negative Straight leg raise left: negative  Reflexes  Patellar: normal Achilles: normal Biceps: normal Babinski's sign: normal   Other  Toe walk: normal Heel walk: normal Sensation: normal Gait: normal  Erythema: no back redness Scars: absent      Specialty Comments:  No specialty comments available.  Imaging: No results found.   PMFS History: Patient Active Problem List   Diagnosis Date Noted  . Ulnar neuropathy 04/18/2017  . GERD (gastroesophageal reflux disease) 10/19/2016  . Prediabetes 05/03/2016  . Cubital tunnel syndrome on right 03/01/2016  . Numbness and tingling of right arm 01/01/2016  . Chlamydia infection 01/27/2015  . Smoking 12/26/2013  . Chronic lower back pain 04/13/2013  . Sciatica 04/13/2013  . Chronic neck pain 04/13/2013  . Spondylosis of cervical spine 04/13/2013   Past Medical History:  Diagnosis Date  . Acid reflux   . Carpal tunnel syndrome   . Diverticulitis   . Low back pain   . Sacroiliac inflammation ([place])   . Sciatica   . Seasonal allergies     Family History  Problem Relation Age of Onset  . Healthy Mother   . Other Father        Unsure of medical history  . Asthma Maternal Aunt   . Diabetes Maternal Aunt   . Cancer Maternal Aunt   . Breast cancer Maternal Aunt   . Hypertension Maternal Grandmother   . Colon cancer Neg Hx   . Pancreatic cancer Neg Hx   . Rectal cancer Neg Hx   . Stomach cancer Neg Hx     Past Surgical History:  Procedure Laterality Date  . ABDOMINAL HYSTERECTOMY    . ABDOMINAL SURGERY    . ANKLE SURGERY     lt.  . right breast cyst removed     . uretherl     uretrral diverticultis   Social History   Occupational History  . Occupation: Unemployed  Tobacco Use  . Smoking status: Current Every Day Smoker    Packs/day: 0.50    Years: 35.00    Pack years: 17.50  . Smokeless  tobacco: Never Used  Substance and Sexual Activity  . Alcohol use: Yes    Comment: Social only - seldom  . Drug use: No  . Sexual activity: Never

## 2017-05-18 NOTE — Progress Notes (Signed)
No egg or soy allergy known to patient  No issues with past sedation with any surgeries  or procedures, no intubation problems  No diet pills per patient No home 02 use per patient  No blood thinners per patient  Pt denies issues with constipation  No A fib or A flutter  EMMI video sent to pt's e mail  TE from 2015 states pt vomited her 1st dose of suprep and had to cancel her procedure- We discussed all preps Dr Loletha Carrow uses in [facility] today- pt choose to use Plenvu- She also has zofran at home so I instructed her to use 1 -4 mg Zofran 30 minutes before each prep dose, to take her time drinking the prep - use straw and ice and can use candy, chase with another liquid, try popsicle- instructed to call with questions and problems

## 2017-06-01 ENCOUNTER — Ambulatory Visit (AMBULATORY_SURGERY_CENTER): Payer: Medicaid Other | Admitting: Gastroenterology

## 2017-06-01 ENCOUNTER — Other Ambulatory Visit: Payer: Self-pay

## 2017-06-01 ENCOUNTER — Encounter: Payer: Self-pay | Admitting: Gastroenterology

## 2017-06-01 VITALS — BP 116/72 | HR 59 | Temp 97.7°F | Resp 14 | Ht 67.0 in | Wt 146.0 lb

## 2017-06-01 DIAGNOSIS — Z1211 Encounter for screening for malignant neoplasm of colon: Secondary | ICD-10-CM

## 2017-06-01 DIAGNOSIS — D123 Benign neoplasm of transverse colon: Secondary | ICD-10-CM

## 2017-06-01 MED ORDER — SODIUM CHLORIDE 0.9 % IV SOLN: 500.0000 mL | Freq: Once | INTRAVENOUS | Status: DC

## 2017-06-01 NOTE — Op Note (Signed)
[facility] Patient Name: Cheryl King Procedure Date: 06/01/2017 11:37 AM MRN: [id number] Endoscopist: Mallie Mussel L. Loletha Carrow , MD Age: 52 Referring MD:  Date of Birth: [birth year] Gender: Female Account #: [id number] Procedure:                Colonoscopy Indications:              Screening for colorectal malignant neoplasm, This                            is the patient's first colonoscopy Medicines:                Monitored Anesthesia Care Procedure:                Pre-Anesthesia Assessment:                           - Prior to the procedure, a History and Physical                            was performed, and patient medications and                            allergies were reviewed. The patient's tolerance of                            previous anesthesia was also reviewed. The risks                            and benefits of the procedure and the sedation                            options and risks were discussed with the patient.                            All questions were answered, and informed consent                            was obtained. Prior Anticoagulants: The patient has                            taken no previous anticoagulant or antiplatelet                            agents. ASA Grade Assessment: II - A patient with                            mild systemic disease. After reviewing the risks                            and benefits, the patient was deemed in                            satisfactory condition to undergo the procedure.  After obtaining informed consent, the colonoscope                            was passed under direct vision. Throughout the                            procedure, the patient's blood pressure, pulse, and                            oxygen saturations were monitored continuously. The                            Colonoscope was introduced through the anus and                            advanced to the the  cecum, identified by                            appendiceal orifice and ileocecal valve. The                            colonoscopy was performed without difficulty. The                            patient tolerated the procedure well. The quality                            of the bowel preparation was excellent. The                            ileocecal valve, appendiceal orifice, and rectum                            were photographed. The quality of the bowel                            preparation was evaluated using the BBPS [facility]                            Bowel Preparation Scale) with scores of: Right                            Colon = 3, Transverse Colon = 3 and Left Colon = 3                            (entire mucosa seen well with no residual staining,                            small fragments of stool or opaque liquid). The                            total BBPS score equals 9. Scope In: 11:46:37 AM Scope Out: 11:59:31 AM Scope Withdrawal Time: 0 hours 10  minutes 52 seconds  Total Procedure Duration: 0 hours 12 minutes 54 seconds  Findings:                 The perianal and digital rectal examinations were                            normal.                           A 4 mm polyp was found in the hepatic flexure. The                            polyp was sessile. The polyp was removed with a                            cold snare. Resection and retrieval were complete. Complications:            No immediate complications. Estimated Blood Loss:     Estimated blood loss was minimal. Impression:               - One 4 mm polyp at the hepatic flexure, removed                            with a cold snare. Resected and retrieved. Recommendation:           - Patient has a contact number available for                            emergencies. The signs and symptoms of potential                            delayed complications were discussed with the                            patient. Return  to normal activities tomorrow.                            Written discharge instructions were provided to the                            patient.                           - Resume previous diet.                           - Continue present medications.                           - Await pathology results.                           - Repeat colonoscopy is recommended for                            surveillance. The colonoscopy date will be  determined after pathology results from today's                            exam become available for review. Henry L. Loletha Carrow, MD 06/01/2017 12:02:48 PM This report has been signed electronically.

## 2017-06-01 NOTE — Progress Notes (Signed)
Called to room to assist during endoscopic procedure.  Patient ID and intended procedure confirmed with present staff. Received instructions for my participation in the procedure from the performing physician.  

## 2017-06-01 NOTE — Patient Instructions (Signed)
YOU HAD AN ENDOSCOPIC PROCEDURE TODAY AT [facility]:   Refer to the procedure report that was given to you for any specific questions about what was found during the examination.  If the procedure report does not answer your questions, please call your gastroenterologist to clarify.  If you requested that your care partner not be given the details of your procedure findings, then the procedure report has been included in a sealed envelope for you to review at your convenience later.  YOU SHOULD EXPECT: Some feelings of bloating in the abdomen. Passage of more gas than usual.  Walking can help get rid of the air that was put into your GI tract during the procedure and reduce the bloating. If you had a lower endoscopy (such as a colonoscopy or flexible sigmoidoscopy) you may notice spotting of blood in your stool or on the toilet paper. If you underwent a bowel prep for your procedure, you may not have a normal bowel movement for a few days.  Please Note:  You might notice some irritation and congestion in your nose or some drainage.  This is from the oxygen used during your procedure.  There is no need for concern and it should clear up in a day or so.  SYMPTOMS TO REPORT IMMEDIATELY:   Following lower endoscopy (colonoscopy or flexible sigmoidoscopy):  Excessive amounts of blood in the stool  Significant tenderness or worsening of abdominal pains  Swelling of the abdomen that is new, acute  Fever of 100F or higher  For urgent or emergent issues, a gastroenterologist can be reached at any hour by calling [phone].   DIET:  We do recommend a small meal at first, but then you may proceed to your regular diet.  Drink plenty of fluids but you should avoid alcoholic beverages for 24 hours.  ACTIVITY:  You should plan to take it easy for the rest of today and you should NOT DRIVE or use heavy machinery until tomorrow (because of the sedation medicines used during the test).     FOLLOW UP: Our staff will call the number listed on your records the next business day following your procedure to check on you and address any questions or concerns that you may have regarding the information given to you following your procedure. If we do not reach you, we will leave a message.  However, if you are feeling well and you are not experiencing any problems, there is no need to return our call.  We will assume that you have returned to your regular daily activities without incident.  If any biopsies were taken you will be contacted by phone or by letter within the next 1-3 weeks.  Please call us at [phone] if you have not heard about the biopsies in 3 weeks.    SIGNATURES/CONFIDENTIALITY: You and/or your care partner have signed paperwork which will be entered into your electronic medical record.  These signatures attest to the fact that that the information above on your After Visit Summary has been reviewed and is understood.  Full responsibility of the confidentiality of this discharge information lies with you and/or your care-partner.   HAND OUT ON POLYPS

## 2017-06-01 NOTE — Progress Notes (Signed)
Pt's states no medical or surgical changes since previsit or office visit.  No egg or soy allergy  

## 2017-06-01 NOTE — Progress Notes (Signed)
To PACU, VSS. Report to [email or web address] 

## 2017-06-02 ENCOUNTER — Telehealth: Payer: Self-pay | Admitting: *Deleted

## 2017-06-02 NOTE — Telephone Encounter (Signed)
  Follow up Call-  Call back number 06/01/2017  Post procedure Call Back phone  # [phone]  Permission to leave phone message Yes  Some recent data might be hidden     Patient questions:  Do you have a fever, pain , or abdominal swelling? No. Pain Score  0 *  Have you tolerated food without any problems? Yes.    Have you been able to return to your normal activities? Yes.    Do you have any questions about your discharge instructions: Diet   No. Medications  No. Follow up visit  No.  Do you have questions or concerns about your Care? No.  Actions: * If pain score is 4 or above: No action needed, pain <4.

## 2017-06-12 ENCOUNTER — Encounter: Payer: Self-pay | Admitting: Gastroenterology

## 2017-06-14 ENCOUNTER — Other Ambulatory Visit (INDEPENDENT_AMBULATORY_CARE_PROVIDER_SITE_OTHER): Payer: Self-pay | Admitting: Specialist

## 2017-06-14 DIAGNOSIS — M542 Cervicalgia: Secondary | ICD-10-CM

## 2017-06-14 DIAGNOSIS — M5136 Other intervertebral disc degeneration, lumbar region: Secondary | ICD-10-CM

## 2017-06-14 MED FILL — NAPROXEN 500 MG TABLET: 500 | 30 days supply | Qty: 60 | Fill #1

## 2017-06-14 MED FILL — CETIRIZINE HCL 10 MG TABS: 10 | 30 days supply | Qty: 30 | Fill #4

## 2017-06-14 MED FILL — ACETAMINOPHEN/COD #3 TABLET: 300-30 | 7 days supply | Qty: 21 | Fill #0

## 2017-06-14 MED FILL — DULoxetine HCL 60 MG CPEP: 60 | 30 days supply | Qty: 30 | Fill #4

## 2017-06-14 MED FILL — CYCLOBENZAPRINE 10 MG TAB: 10 | 30 days supply | Qty: 60 | Fill #1

## 2017-06-14 MED FILL — ATORVASTATIN 20 MG TABLET: 20 | 30 days supply | Qty: 30 | Fill #4

## 2017-06-14 MED FILL — OMEPRAZOLE 20 MG CAP: 20 | 30 days supply | Qty: 30 | Fill #5

## 2017-06-14 NOTE — Telephone Encounter (Signed)
Called to [facility]

## 2017-06-14 NOTE — Telephone Encounter (Signed)
Tylenol # 3 Refill request 

## 2017-06-15 MED FILL — FLUTICASONE PROP 50 MCG SPR: 50 | 30 days supply | Qty: 16 | Fill #0

## 2017-07-13 MED FILL — FLUTICASONE PROP 50 MCG SPR: 50 | 30 days supply | Qty: 16 | Fill #1

## 2017-07-18 ENCOUNTER — Encounter: Payer: Self-pay | Admitting: Family Medicine

## 2017-07-18 ENCOUNTER — Ambulatory Visit: Payer: Medicaid Other | Attending: Family Medicine | Admitting: Family Medicine

## 2017-07-18 VITALS — BP 97/63 | HR 75 | Temp 97.5°F | Ht 67.0 in | Wt 146.4 lb

## 2017-07-18 DIAGNOSIS — Z791 Long term (current) use of non-steroidal anti-inflammatories (NSAID): Secondary | ICD-10-CM | POA: Insufficient documentation

## 2017-07-18 DIAGNOSIS — Z880 Allergy status to penicillin: Secondary | ICD-10-CM | POA: Insufficient documentation

## 2017-07-18 DIAGNOSIS — G8929 Other chronic pain: Secondary | ICD-10-CM | POA: Insufficient documentation

## 2017-07-18 DIAGNOSIS — M5442 Lumbago with sciatica, left side: Secondary | ICD-10-CM | POA: Diagnosis not present

## 2017-07-18 DIAGNOSIS — R7303 Prediabetes: Secondary | ICD-10-CM | POA: Insufficient documentation

## 2017-07-18 DIAGNOSIS — F17200 Nicotine dependence, unspecified, uncomplicated: Secondary | ICD-10-CM

## 2017-07-18 DIAGNOSIS — E7800 Pure hypercholesterolemia, unspecified: Secondary | ICD-10-CM | POA: Insufficient documentation

## 2017-07-18 DIAGNOSIS — Z9889 Other specified postprocedural states: Secondary | ICD-10-CM | POA: Insufficient documentation

## 2017-07-18 DIAGNOSIS — F17210 Nicotine dependence, cigarettes, uncomplicated: Secondary | ICD-10-CM | POA: Insufficient documentation

## 2017-07-18 DIAGNOSIS — Z90710 Acquired absence of both cervix and uterus: Secondary | ICD-10-CM | POA: Insufficient documentation

## 2017-07-18 DIAGNOSIS — K219 Gastro-esophageal reflux disease without esophagitis: Secondary | ICD-10-CM | POA: Diagnosis not present

## 2017-07-18 DIAGNOSIS — N393 Stress incontinence (female) (male): Secondary | ICD-10-CM | POA: Insufficient documentation

## 2017-07-18 DIAGNOSIS — G5620 Lesion of ulnar nerve, unspecified upper limb: Secondary | ICD-10-CM | POA: Diagnosis not present

## 2017-07-18 DIAGNOSIS — L309 Dermatitis, unspecified: Secondary | ICD-10-CM | POA: Insufficient documentation

## 2017-07-18 DIAGNOSIS — Z885 Allergy status to narcotic agent status: Secondary | ICD-10-CM | POA: Insufficient documentation

## 2017-07-18 DIAGNOSIS — Z79899 Other long term (current) drug therapy: Secondary | ICD-10-CM | POA: Insufficient documentation

## 2017-07-18 DIAGNOSIS — N39498 Other specified urinary incontinence: Secondary | ICD-10-CM | POA: Diagnosis not present

## 2017-07-18 DIAGNOSIS — E785 Hyperlipidemia, unspecified: Secondary | ICD-10-CM | POA: Insufficient documentation

## 2017-07-18 DIAGNOSIS — Z79891 Long term (current) use of opiate analgesic: Secondary | ICD-10-CM | POA: Insufficient documentation

## 2017-07-18 DIAGNOSIS — G5621 Lesion of ulnar nerve, right upper limb: Secondary | ICD-10-CM | POA: Diagnosis not present

## 2017-07-18 LAB — POCT URINALYSIS DIPSTICK
BILIRUBIN UA: NEGATIVE
Blood, UA: NEGATIVE
GLUCOSE UA: NEGATIVE
KETONES UA: NEGATIVE
Leukocytes, UA: NEGATIVE
Nitrite, UA: NEGATIVE
PH UA: 6.5 (ref 5.0–8.0)
PROTEIN UA: NEGATIVE
Spec Grav, UA: 1.02 (ref 1.010–1.025)
Urobilinogen, UA: 0.2 E.U./dL

## 2017-07-18 MED ORDER — TRIAMCINOLONE ACETONIDE 0.1 % EX CREA: 1.0000 "application " | TOPICAL_CREAM | Freq: Two times a day (BID) | CUTANEOUS | 1 refills | Status: DC

## 2017-07-18 MED ORDER — NICOTINE 14 MG/24HR TD PT24: 14.0000 mg | MEDICATED_PATCH | Freq: Every day | TRANSDERMAL | 1 refills | Status: DC

## 2017-07-18 MED ORDER — PREGABALIN 50 MG PO CAPS: 50.0000 mg | ORAL_CAPSULE | Freq: Every day | ORAL | 5 refills | Status: DC

## 2017-07-18 MED FILL — OMEPRAZOLE 20 MG CAP: 20 | 30 days supply | Qty: 30 | Fill #0

## 2017-07-18 MED FILL — NAPROXEN 500 MG TABLET: 500 | 30 days supply | Qty: 60 | Fill #2

## 2017-07-18 MED FILL — CYCLOBENZAPRINE 10 MG TAB: 10 | 30 days supply | Qty: 60 | Fill #2

## 2017-07-18 MED FILL — ATORVASTATIN 20 MG TABLET: 20 | 30 days supply | Qty: 30 | Fill #5

## 2017-07-18 MED FILL — NICOTINE 14 MG/24HR PATCH: 14 | 28 days supply | Qty: 28 | Fill #0

## 2017-07-18 MED FILL — DULoxetine HCL 60 MG CPEP: 60 | 30 days supply | Qty: 30 | Fill #5

## 2017-07-18 MED FILL — TRIAMCINOLONE ACETONIDE 0.1: 0.1 | 20 days supply | Qty: 45 | Fill #0

## 2017-07-18 MED FILL — CETIRIZINE HCL 10 MG TABLET: 10 | 30 days supply | Qty: 30 | Fill #5

## 2017-07-18 MED FILL — ACETAMINOPHEN/COD #3 TABLET: 300-30 | 3 days supply | Qty: 9 | Fill #1

## 2017-07-18 NOTE — Progress Notes (Signed)
Subjective:  Patient ID: Cheryl King, female    DOB: 05-15-1965  Age: 52 y.o. MRN: [id number]  CC: Back Pain   HPI Tahjanae King is 52 year old female with Medical history  significant for Prediabetes (diet controlled A1c 5.8) chronic low back pain with sciatica, GERD, hyperlipidemia tobacco abuse here for a follow-up visit. Her low back pain is uncontrolled and has not been relieved by epidural spinal injections but she continues to follow-up with orthopedics-Dr. Louanne Skye and Dr. Ernestina Patches and remains on Lyrica and Cymbalta.  Back pain is rated as an 8/10 at this time. She complains of a pruritic rash on her back which has been present for the last few weeks and denies allergies to soaps or creams and has not applied any OTC creams.  She also noticed a bump on her left forearm which she is unsure is an insect bite. Also endorses intermittent stress  incontinence but denies dysuria, pelvic pain or flank pain.  She notices occasional vaginal dryness but no discharge. She currently smokes half to 1 pack of cigarettes per day and would like help in quitting smoking.  Reflux symptoms are controlled on her PPI and she is doing well on statin for hyperlipidemia.  Past Medical History:  Diagnosis Date  . Acid reflux   . Allergy   . Carpal tunnel syndrome   . Depression   . Diverticulitis   . Hyperlipidemia   . Low back pain   . Neck pain   . Sacroiliac inflammation ([place])   . Sciatica   . Seasonal allergies     Past Surgical History:  Procedure Laterality Date  . ABDOMINAL SURGERY    . ANKLE SURGERY     lt.  [facility] Kitchen PARTIAL HYSTERECTOMY    . right breast cyst removed     . URETHRAL DIVERTICULUM REPAIR     uretrral diverticultis    Allergies  Allergen Reactions  . Percocet [Oxycodone-Acetaminophen] Nausea Only  . Vicodin [Hydrocodone-Acetaminophen] Nausea Only  . Penicillins Rash     Outpatient Medications Prior to Visit  Medication Sig Dispense Refill  .  acetaminophen-codeine (TYLENOL #3) 300-30 MG tablet TAKE 1 TABLET BY MOUTH EVERY 8 HOURS AS NEEDED FOR MODERATE PAIN. 30 tablet 0  . atorvastatin (LIPITOR) 20 MG tablet Take 1 tablet (20 mg total) by mouth daily. 90 tablet 1  . butalbital-acetaminophen-caffeine (FIORICET, ESGIC) 50-325-40 MG tablet Take 1 tablet by mouth every 12 (twelve) hours as needed for headache. 60 tablet 1  . cetirizine (ZYRTEC) 10 MG tablet Take 1 tablet (10 mg total) by mouth daily. 90 tablet 1  . cyclobenzaprine (FLEXERIL) 10 MG tablet Take 1 tablet (10 mg total) by mouth 2 (two) times daily as needed for muscle spasms. 60 tablet 2  . diclofenac sodium (VOLTAREN) 1 % GEL Apply 2 g topically 4 (four) times daily. 5 Tube 2  . DULoxetine (CYMBALTA) 60 MG capsule Take 1 capsule (60 mg total) by mouth daily. 90 capsule 1  . fluticasone (FLONASE) 50 MCG/ACT nasal spray Place 2 sprays into both nostrils daily. 16 g 3  . Lactobacillus CAPS Take 1 capsule by mouth daily.    . Multiple Vitamin (MULTIVITAMIN WITH MINERALS) TABS tablet Take 1 tablet by mouth daily.    . naproxen (EC-NAPROSYN) 500 MG EC tablet Take 1 tablet (500 mg total) by mouth 2 (two) times daily with a meal. 60 tablet 3  . omeprazole (PRILOSEC) 20 MG capsule Take 1 capsule (20 mg total) by mouth daily. 90 capsule 1  .  ondansetron (ZOFRAN) 4 MG tablet TAKE 1 TABLET BY MOUTH EVERY 8 HOURS AS NEEDED FOR NAUSEA OR VOMITING. 60 tablet 1  . RESTASIS 0.05 % ophthalmic emulsion   3  . traMADol (ULTRAM) 50 MG tablet Take by mouth every 6 (six) hours as needed.    . pregabalin (LYRICA) 50 MG capsule Take 1 capsule (50 mg total) by mouth at bedtime. 90 capsule 3   Facility-Administered Medications Prior to Visit  Medication Dose Route Frequency Provider Last Rate Last Dose  . 0.9 %  sodium chloride infusion  500 mL Intravenous Once Nelida Meuse III, MD        ROS Review of Systems  Constitutional: Negative for activity change, appetite change and fatigue.  HENT:  Negative for congestion, sinus pressure and sore throat.   Eyes: Negative for visual disturbance.  Respiratory: Negative for cough, chest tightness, shortness of breath and wheezing.   Cardiovascular: Negative for chest pain and palpitations.  Gastrointestinal: Negative for abdominal distention, abdominal pain and constipation.  Endocrine: Negative for polydipsia.  Genitourinary: Negative for dysuria and frequency.  Musculoskeletal:       See hpi  Skin: Positive for rash.  Neurological: Negative for tremors, light-headedness and numbness.  Hematological: Does not bruise/bleed easily.  Psychiatric/Behavioral: Negative for agitation and behavioral problems.    Objective:  BP 97/63   Pulse 75   Temp (!) 97.5 F (36.4 C) (Oral)   Ht 5\' 7"  (1.702 m)   Wt 146 lb 6.4 oz (66.4 kg)   SpO2 100%   BMI 22.93 kg/m   BP/Weight 07/18/2017 04/02/4006 06/24/6193  Systolic BP 97 093 -  Diastolic BP 63 72 -  Wt. (Lbs) 146.4 146 146  BMI 22.93 22.87 22.87      Physical Exam  Constitutional: She is oriented to person, place, and time. She appears well-developed and well-nourished.  Cardiovascular: Normal rate, normal heart sounds and intact distal pulses.  No murmur heard. Pulmonary/Chest: Effort normal and breath sounds normal. She has no wheezes. She has no rales. She exhibits no tenderness.  Abdominal: Soft. Bowel sounds are normal. She exhibits no distension and no mass. There is no tenderness.  Musculoskeletal: Normal range of motion. She exhibits tenderness (TTP across lumbar spine; negative straight leg raise).  Neurological: She is alert and oriented to person, place, and time.  Skin:  Two dry patches of skin on mid back with scaly surface Nontender nodule on left thumb  Psychiatric: She has a normal mood and affect.     CMP Latest Ref Rng & Units 04/18/2017 01/17/2017 05/03/2016  Glucose 65 - 99 mg/dL 86 116(H) 81  BUN 6 - 24 mg/dL 14 19 18   Creatinine 0.57 - 1.00 mg/dL 0.67 0.74  0.74  Sodium 134 - 144 mmol/L 140 141 143  Potassium 3.5 - 5.2 mmol/L 4.9 4.3 4.9  Chloride 96 - 106 mmol/L 102 103 103  CO2 20 - 29 mmol/L 27 25 25   Calcium 8.7 - 10.2 mg/dL 9.9 9.7 9.8  Total Protein 6.0 - 8.5 g/dL 7.0 6.9 -  Total Bilirubin 0.0 - 1.2 mg/dL 0.3 0.3 -  Alkaline Phos 39 - 117 IU/L 111 99 -  AST 0 - 40 IU/L 22 22 -  ALT 0 - 32 IU/L 19 17 -    Lipid Panel     Component Value Date/Time   CHOL 194 04/18/2017 1003   TRIG 80 04/18/2017 1003   HDL 58 04/18/2017 1003   CHOLHDL 3.3 04/18/2017 1003  CHOLHDL 3.2 12/26/2013 1319   VLDL 18 12/26/2013 1319   LDLCALC 120 (H) 04/18/2017 1003    Lab Results  Component Value Date   HGBA1C 5.8 10/19/2016    Assessment & Plan:   1. Chronic midline low back pain with left-sided sciatica At this post epidural spinal injections which no much relief Continue Cymbalta We have discussed the role of physical therapy, most meds, aquatic therapy and she will be considering these - pregabalin (LYRICA) 50 MG capsule; Take 1 capsule (50 mg total) by mouth at bedtime.  Dispense: 30 capsule; Refill: 5  2. Prediabetes A1c 5.8 from 10/2016 We will check A1c at next visit  3. Ulnar neuropathy of right upper extremity Stable on Gabapentin and Cymbalta  4. Gastroesophageal reflux disease without esophagitis Controlled  5. Smoking Discussed smoking cessation and has adverse effect of smoking and she is willing to work on quitting - nicotine (NICODERM CQ) 14 mg/24hr patch; Place 1 patch (14 mg total) onto the skin daily. For 4 weeks then 7mg /24hr daily for  4 weeks  Dispense: 28 patch; Refill: 1  6. Pure hypercholesterolemia Controlled on statin Low-cholesterol diet  7. Other urinary incontinence Urine negative for UTI Discussed Ke[place] exercises If symptoms persist at next visit will consider oxybutynin  8. Dermatitis - triamcinolone cream (KENALOG) 0.1 %; Apply 1 application topically 2 (two) times daily.  Dispense: 45 g;  Refill: 1   Meds ordered this encounter  Medications  . pregabalin (LYRICA) 50 MG capsule    Sig: Take 1 capsule (50 mg total) by mouth at bedtime.    Dispense:  30 capsule    Refill:  5  . triamcinolone cream (KENALOG) 0.1 %    Sig: Apply 1 application topically 2 (two) times daily.    Dispense:  45 g    Refill:  1  . nicotine (NICODERM CQ) 14 mg/24hr patch    Sig: Place 1 patch (14 mg total) onto the skin daily. For 4 weeks then 7mg /24hr daily for  4 weeks    Dispense:  28 patch    Refill:  1    Follow-up: Return in about 3 months (around 10/18/2017) for follow up of chronic medical conditions.   Charlott Rakes MD

## 2017-07-18 NOTE — Patient Instructions (Addendum)
Back Pain, Adult Back pain is very common. The pain often gets better over time. The cause of back pain is usually not dangerous. Most people can learn to manage their back pain on their own. Follow these instructions at home: Watch your back pain for any changes. The following actions may help to lessen any pain you are feeling:  Stay active. Start with short walks on flat ground if you can. Try to walk farther each day.  Exercise regularly as told by your doctor. Exercise helps your back heal faster. It also helps avoid future injury by keeping your muscles strong and flexible.  Do not sit, drive, or stand in one place for more than 30 minutes.  Do not stay in bed. Resting more than 1-2 days can slow down your recovery.  Be careful when you bend or lift an object. Use good form when lifting: ? Bend at your knees. ? Keep the object close to your body. ? Do not twist.  Sleep on a firm mattress. Lie on your side, and bend your knees. If you lie on your back, put a pillow under your knees.  Take medicines only as told by your doctor.  Put ice on the injured area. ? Put ice in a plastic bag. ? Place a towel between your skin and the bag. ? Leave the ice on for 20 minutes, 2-3 times a day for the first 2-3 days. After that, you can switch between ice and heat packs.  Avoid feeling anxious or stressed. Find good ways to deal with stress, such as exercise.  Maintain a healthy weight. Extra weight puts stress on your back.  Contact a doctor if:  You have pain that does not go away with rest or medicine.  You have worsening pain that goes down into your legs or buttocks.  You have pain that does not get better in one week.  You have pain at night.  You lose weight.  You have a fever or chills. Get help right away if:  You cannot control when you poop (bowel movement) or pee (urinate).  Your arms or legs feel weak.  Your arms or legs lose feeling (numbness).  You feel sick  to your stomach (nauseous) or throw up (vomit).  You have belly (abdominal) pain.  You feel like you may pass out (faint). This information is not intended to replace advice given to you by your health care provider. Make sure you discuss any questions you have with your health care provider. Document Released: 06/23/2007 Document Revised: 06/12/2015 Document Reviewed: 05/08/2013 Elsevier Interactive Patient Education  2018 [employer].  Kegel Exercises Kegel exercises help strengthen the muscles that support the rectum, vagina, small intestine, bladder, and uterus. Doing Kegel exercises can help:  Improve bladder and bowel control.  Improve sexual response.  Reduce problems and discomfort during pregnancy.  Kegel exercises involve squeezing your pelvic floor muscles, which are the same muscles you squeeze when you try to stop the flow of urine. The exercises can be done while sitting, standing, or lying down, but it is best to vary your position. Phase 1 exercises 1. Squeeze your pelvic floor muscles tight. You should feel a tight lift in your rectal area. If you are a female, you should also feel a tightness in your vaginal area. Keep your stomach, buttocks, and legs relaxed. 2. Hold the muscles tight for up to 10 seconds. 3. Relax your muscles. Repeat this exercise 50 times a day or as many  times as told by your health care provider. Continue to do this exercise for at least 4-6 weeks or for as long as told by your health care provider. This information is not intended to replace advice given to you by your health care provider. Make sure you discuss any questions you have with your health care provider. Document Released: 12/22/2011 Document Revised: 08/30/2015 Document Reviewed: 11/24/2014 Elsevier Interactive Patient Education  [employer].

## 2017-07-29 ENCOUNTER — Encounter (INDEPENDENT_AMBULATORY_CARE_PROVIDER_SITE_OTHER): Payer: Self-pay | Admitting: Specialist

## 2017-07-29 ENCOUNTER — Ambulatory Visit (INDEPENDENT_AMBULATORY_CARE_PROVIDER_SITE_OTHER): Payer: Medicaid Other | Admitting: Specialist

## 2017-07-29 VITALS — BP 104/69 | HR 76 | Ht 67.0 in | Wt 147.0 lb

## 2017-07-29 DIAGNOSIS — M5030 Other cervical disc degeneration, unspecified cervical region: Secondary | ICD-10-CM

## 2017-07-29 DIAGNOSIS — M7062 Trochanteric bursitis, left hip: Secondary | ICD-10-CM | POA: Diagnosis not present

## 2017-07-29 DIAGNOSIS — M7061 Trochanteric bursitis, right hip: Secondary | ICD-10-CM

## 2017-07-29 DIAGNOSIS — M5416 Radiculopathy, lumbar region: Secondary | ICD-10-CM

## 2017-07-29 DIAGNOSIS — G5621 Lesion of ulnar nerve, right upper limb: Secondary | ICD-10-CM | POA: Diagnosis not present

## 2017-07-29 NOTE — Progress Notes (Signed)
Office Visit Note   Patient: Cheryl King           Date of Birth: [birth year]           MRN: [id number] Visit Date: 07/29/2017              Requested by: Charlott Rakes, MD [place], [place] [address] PCP: Charlott Rakes, MD   Assessment & Plan: Visit Diagnoses:  1. Radiculopathy, lumbar region   2. Ulnar neuropathy at wrist, right   3. Degenerative disc disease, cervical   4. Greater trochanteric bursitis, right   5. Greater trochanteric bursitis, left     Plan: Since patient stated that the last lumbar ESI's performed by Dr. Ernestina Patches did give some improvement of her pain I will schedule her to have this done again.  She was given IT band stretching exercises to do at home to see if this helps with her lateral hip pain.  She will also read more up on ulnar nerve decompression and when she follows up with Dr. Louanne Skye in 6 weeks she will let them know if she is wanting to proceed with that outpatient surgery as we again discussed today.  Patient is comfortable with today's treatment plan.  All questions answered.  Follow-Up Instructions: Return in about 6 weeks (around 09/09/2017) for with Dr Louanne Skye.   Orders:  Orders Placed This Encounter  Procedures  . Ambulatory referral to Physical Medicine Rehab   No orders of the defined types were placed in this encounter.     Procedures: No procedures performed   Clinical Data: No additional findings.   Subjective: Chief Complaint  Patient presents with  . Neck - Follow-up  . Lower Back - Follow-up    HPI 52 year old white female history of cervical spondylosis and lumbar spondylosis returns.  States that her symptoms are unchanged from previous visit.  Patient also has issues with right ulnar neuropathy.  She has also seen neurologist for her arm symptoms.  Right L4-5 and left L3-4 ESI's performed by Dr. Ernestina Patches few months ago gave some improvement of her pain.  Patient continues to have radicular pain down  to her feet but she is also having soreness over the bilateral lateral hips.  Lateral hip pain aggravated when she is ambulating and also laying on each side. Review of Systems No current cardiac pulmonary GI GU issues  Objective: Vital Signs: BP 104/69 (BP Location: Left Arm, Patient Position: Sitting)   Pulse 76   Ht 5\' 7"  (1.702 m)   Wt 147 lb (66.7 kg)   BMI 23.02 kg/m   Physical Exam  Constitutional: She is oriented to person, place, and time. She appears well-developed. No distress.  HENT:  Head: Normocephalic and atraumatic.  Eyes: Pupils are equal, round, and reactive to light. EOM are normal.  Neck:  Mild bilateral brachial plexus tenderness.    Pulmonary/Chest: No respiratory distress.  Musculoskeletal:  Bilateral elbows good range of motion.  Markedly positive Tinel's over the right cubital tunnel.  Positive Tinel's bilateral carpal tunnel.  Negative straight leg raise.  Moderately tender over the bilateral hip greater trochanter bursa.  Negative logroll.  Neurological: She is alert and oriented to person, place, and time.    Ortho Exam  Specialty Comments:  No specialty comments available.  Imaging: No results found.   PMFS History: Patient Active Problem List   Diagnosis Date Noted  . Hyperlipidemia 07/18/2017  . Ulnar neuropathy 04/18/2017  . GERD (gastroesophageal reflux disease) 10/19/2016  .  Prediabetes 05/03/2016  . Cubital tunnel syndrome on right 03/01/2016  . Numbness and tingling of right arm 01/01/2016  . Chlamydia infection 01/27/2015  . Smoking 12/26/2013  . Chronic lower back pain 04/13/2013  . Sciatica 04/13/2013  . Chronic neck pain 04/13/2013  . Spondylosis of cervical spine 04/13/2013   Past Medical History:  Diagnosis Date  . Acid reflux   . Allergy   . Carpal tunnel syndrome   . Depression   . Diverticulitis   . Hyperlipidemia   . Low back pain   . Neck pain   . Sacroiliac inflammation ([place])   . Sciatica   . Seasonal  allergies     Family History  Problem Relation Age of Onset  . Healthy Mother   . Other Father        Unsure of medical history  . Asthma Maternal Aunt   . Diabetes Maternal Aunt   . Cancer Maternal Aunt   . Breast cancer Maternal Aunt   . Hypertension Maternal Grandmother   . Colon cancer Neg Hx   . Pancreatic cancer Neg Hx   . Rectal cancer Neg Hx   . Stomach cancer Neg Hx   . Colon polyps Neg Hx   . Esophageal cancer Neg Hx     Past Surgical History:  Procedure Laterality Date  . ABDOMINAL SURGERY    . ANKLE SURGERY     lt.  Marland Kitchen PARTIAL HYSTERECTOMY    . right breast cyst removed     . URETHRAL DIVERTICULUM REPAIR     uretrral diverticultis   Social History   Occupational History  . Occupation: Unemployed  Tobacco Use  . Smoking status: Current Every Day Smoker    Packs/day: 0.50    Years: 35.00    Pack years: 17.50  . Smokeless tobacco: Never Used  Substance and Sexual Activity  . Alcohol use: Yes    Comment: Social only - seldom  . Drug use: No  . Sexual activity: Never

## 2017-08-15 MED FILL — OMEPRAZOLE 20 MG CAP: 20 | 30 days supply | Qty: 30 | Fill #1

## 2017-08-15 MED FILL — FLUTICASONE PROP 50 MCG SPR: 50 | 30 days supply | Qty: 16 | Fill #2

## 2017-08-16 ENCOUNTER — Ambulatory Visit (INDEPENDENT_AMBULATORY_CARE_PROVIDER_SITE_OTHER): Payer: Self-pay

## 2017-08-16 ENCOUNTER — Ambulatory Visit (INDEPENDENT_AMBULATORY_CARE_PROVIDER_SITE_OTHER): Payer: Medicaid Other | Admitting: Physical Medicine and Rehabilitation

## 2017-08-16 VITALS — BP 119/68 | HR 69 | Temp 98.9°F

## 2017-08-16 DIAGNOSIS — M5416 Radiculopathy, lumbar region: Secondary | ICD-10-CM | POA: Diagnosis not present

## 2017-08-16 DIAGNOSIS — M5116 Intervertebral disc disorders with radiculopathy, lumbar region: Secondary | ICD-10-CM

## 2017-08-16 MED ORDER — BETAMETHASONE SOD PHOS & ACET 6 (3-3) MG/ML IJ SUSP
12.0000 mg | Freq: Once | INTRAMUSCULAR | Status: AC
  Administered 2017-08-16: 12 mg

## 2017-08-16 NOTE — Patient Instructions (Signed)

## 2017-08-16 NOTE — Progress Notes (Signed)
Numeric Pain Rating Scale and Functional Assessment Average Pain 10   In the last MONTH (on 0-10 scale) has pain interfered with the following?  1. General activity like being  able to carry out your everyday physical activities such as walking, climbing stairs, carrying groceries, or moving a chair?  Rating(10)   +Driver, -BT, -Dye Allergies. 

## 2017-08-18 ENCOUNTER — Telehealth (INDEPENDENT_AMBULATORY_CARE_PROVIDER_SITE_OTHER): Payer: Self-pay | Admitting: Physical Medicine and Rehabilitation

## 2017-08-18 NOTE — Telephone Encounter (Signed)
Left message for patient to call back  

## 2017-08-18 NOTE — Telephone Encounter (Signed)
Perfect advice and I agree, if any call back then OV

## 2017-08-23 ENCOUNTER — Other Ambulatory Visit (INDEPENDENT_AMBULATORY_CARE_PROVIDER_SITE_OTHER): Payer: Self-pay | Admitting: Specialist

## 2017-08-23 DIAGNOSIS — M5136 Other intervertebral disc degeneration, lumbar region: Secondary | ICD-10-CM

## 2017-08-23 DIAGNOSIS — M542 Cervicalgia: Secondary | ICD-10-CM

## 2017-08-23 MED FILL — ACETAMINOPHEN/COD #3 TABLET: 300-30 | 7 days supply | Qty: 21 | Fill #0

## 2017-08-23 NOTE — Progress Notes (Signed)
Cheryl King - 52 y.o. female MRN [id number]  Date of birth: 03-29-1965  Office Visit Note: Visit Date: 08/16/2017 PCP: Charlott Rakes, MD Referred by: Charlott Rakes, MD  Subjective: Chief Complaint  Patient presents with  . Lower Back - Pain   HPI: Cheryl King is a 52 year old female followed by Dr. Basil Dess who comes in today at his request for repeat right L4 and left L3 transforaminal epidural steroid injection for low back pain and bilateral radicular leg pain.  The injection  will be diagnostic and hopefully therapeutic. The patient has failed conservative care including time, medications and activity modification.  Patient did get more than 50% relief with last injection in January.   ROS Otherwise per HPI.  Assessment & Plan: Visit Diagnoses:  1. Lumbar radiculopathy   2. Radiculopathy due to lumbar intervertebral disc disorder     Plan: No additional findings.   Meds & Orders:  Meds ordered this encounter  Medications  . betamethasone acetate-betamethasone sodium phosphate (CELESTONE) injection 12 mg    Orders Placed This Encounter  Procedures  . XR C-ARM NO REPORT  . Epidural Steroid injection    Follow-up: Return for Dr. Basil Dess.   Procedures: No procedures performed  Lumbosacral Transforaminal Epidural Steroid Injection - Sub-Pedicular Approach with Fluoroscopic Guidance  Patient: Cheryl King      Date of Birth: 1965/07/22 MRN: [id number] PCP: Charlott Rakes, MD      Visit Date: 08/16/2017   Universal Protocol:    Date/Time: 08/16/2017  Consent Given By: the patient  Position: PRONE  Additional Comments: Vital signs were monitored before and after the procedure. Patient was prepped and draped in the usual sterile fashion. The correct patient, procedure, and site was verified.   Injection Procedure Details:  Procedure Site One Meds Administered:  Meds ordered this encounter  Medications  . betamethasone  acetate-betamethasone sodium phosphate (CELESTONE) injection 12 mg    Laterality: Right L4 and Left L3  Location/Site:  L3-L4 L4-L5  Needle size: 22 G  Needle type: Spinal  Needle Placement: Transforaminal  Findings:    -Comments: Excellent flow of contrast along the nerve and into the epidural space.  Procedure Details: After squaring off the end-plates to get a true AP view, the C-arm was positioned so that an oblique view of the foramen as noted above was visualized. The target area is just inferior to the "nose of the scotty dog" or sub pedicular. The soft tissues overlying this structure were infiltrated with 2-3 ml. of 1% Lidocaine without Epinephrine.  The spinal needle was inserted toward the target using a "trajectory" view along the fluoroscope beam.  Under AP and lateral visualization, the needle was advanced so it did not puncture dura and was located close the 6 O'Clock position of the pedical in AP tracterory. Biplanar projections were used to confirm position. Aspiration was confirmed to be negative for CSF and/or blood. A 1-2 ml. volume of Isovue-250 was injected and flow of contrast was noted at each level. Radiographs were obtained for documentation purposes.   After attaining the desired flow of contrast documented above, a 0.5 to 1.0 ml test dose of 0.25% Marcaine was injected into each respective transforaminal space.  The patient was observed for 90 seconds post injection.  After no sensory deficits were reported, and normal lower extremity motor function was noted,   the above injectate was administered so that equal amounts of the injectate were placed at each foramen (level) into the transforaminal epidural space.  Additional Comments:  The patient tolerated the procedure well Dressing: Band-Aid    Post-procedure details: Patient was observed during the procedure. Post-procedure instructions were reviewed.  Patient left the clinic in stable condition.      Clinical History: 01/01/2016 EMG/NCS Impression: Essentially NORMAL electrodiagnostic study of both upper limbs. There was once again very very mild distal slowing at the ulnar nerve at the wrist.This represents no essential change from the prior study in 2016  There is no significant electrodiagnostic evidence of nerve entrapment, brachial plexopathy, cervical radiculopathy or generalized peripheral neuropathy. As you know, purely sensory or demyelinating radiculopathies and chemical radiculitis may not be detected with this particular electrodiagnostic study.  March 2016 EMG/NCS Very mild slowing of the ulnar nerve distally at the wrist but otherwise was normal. That distal slowing was probably temperature artifact but did sort of fit with more numbness tingling in the right fourth and fifth digit.    MRI LUMBAR SPINE  COMPARISON: Lumbar spine MRI 05/03/2013  FINDINGS: Segmentation: Standard.  Alignment: Physiologic.  Vertebrae: No fracture, evidence of discitis, or bone lesion.  Conus medullaris and cauda equina: Conus extends to the L1 level. Conus and cauda equina appear normal.  Paraspinal and other soft tissues: Negative.  Disc levels:  T11-T12: Minimal disc bulge without stenosis.  T12-L1: Normal disc space without stenosis.  L1-L2: Marked worsening of degenerative disc height loss with endplate remodeling. Small central disc protrusion without stenosis. Normal facets.  L2-L3: Marked worsening of disc height loss. Left eccentric disc bulge with mild-to-moderate narrowing of the left neural foramen. No other stenosis.  L3-L4: Worsened disc degeneration with left foraminal protrusion and annular fissure in close proximity to the exiting left L3 nerve root and causing mild foraminal stenosis. No spinal canal stenosis. Normal facets.  L4-L5: Mild disc desiccation with right foraminal disc protrusion that is worsened from the prior study. This  causes severe right neural foraminal stenosis. Mild left foraminal stenosis. No spinal canal stenosis. Mild right facet hypertrophy.  L5-S1: Disc desiccation with minimal bulge. No stenosis.  IMPRESSION: 1. L4-L5 right foraminal disc protrusion, new/worsened from the prior study, causing severe right neural foraminal stenosis and likely irritating the right L4 nerve root. 2. L2-L3 and L3-L4 worsened degenerative disc disease with left-sided bulges/protrusions causing mild-to-moderate left foraminal stenosis and possible irritation of the exiting nerve roots.   Electronically Signed By: Ulyses Jarred M.D. On: 01/04/2017 22:52   She reports that she has been smoking.  She has a 17.50 pack-year smoking history. She has never used smokeless tobacco.  Recent Labs    10/19/16 0935  HGBA1C 5.8    Objective:  VS:  HT:    WT:   BMI:     BP:119/68  HR:69bpm  TEMP:98.9 F (37.2 C)(Oral)  RESP:97 % Physical Exam  Ortho Exam Imaging: No results found.  Past Medical/Family/Surgical/Social History: Medications & Allergies reviewed per EMR, new medications updated. Patient Active Problem List   Diagnosis Date Noted  . Hyperlipidemia 07/18/2017  . Ulnar neuropathy 04/18/2017  . GERD (gastroesophageal reflux disease) 10/19/2016  . Prediabetes 05/03/2016  . Cubital tunnel syndrome on right 03/01/2016  . Numbness and tingling of right arm 01/01/2016  . Chlamydia infection 01/27/2015  . Smoking 12/26/2013  . Chronic lower back pain 04/13/2013  . Sciatica 04/13/2013  . Chronic neck pain 04/13/2013  . Spondylosis of cervical spine 04/13/2013   Past Medical History:  Diagnosis Date  . Acid reflux   . Allergy   . Carpal tunnel syndrome   .  Depression   . Diverticulitis   . Hyperlipidemia   . Low back pain   . Neck pain   . Sacroiliac inflammation ([place])   . Sciatica   . Seasonal allergies    Family History  Problem Relation Age of Onset  . Healthy Mother   . Other  Father        Unsure of medical history  . Asthma Maternal Aunt   . Diabetes Maternal Aunt   . Cancer Maternal Aunt   . Breast cancer Maternal Aunt   . Hypertension Maternal Grandmother   . Colon cancer Neg Hx   . Pancreatic cancer Neg Hx   . Rectal cancer Neg Hx   . Stomach cancer Neg Hx   . Colon polyps Neg Hx   . Esophageal cancer Neg Hx    Past Surgical History:  Procedure Laterality Date  . ABDOMINAL SURGERY    . ANKLE SURGERY     lt.  Marland Kitchen PARTIAL HYSTERECTOMY    . right breast cyst removed     . URETHRAL DIVERTICULUM REPAIR     uretrral diverticultis   Social History   Occupational History  . Occupation: Unemployed  Tobacco Use  . Smoking status: Current Every Day Smoker    Packs/day: 0.50    Years: 35.00    Pack years: 17.50  . Smokeless tobacco: Never Used  Substance and Sexual Activity  . Alcohol use: Yes    Comment: Social only - seldom  . Drug use: No  . Sexual activity: Never

## 2017-08-23 NOTE — Procedures (Signed)
Lumbosacral Transforaminal Epidural Steroid Injection - Sub-Pedicular Approach with Fluoroscopic Guidance  Patient: Cheryl King      Date of Birth: 06-01-1965 MRN: [id number] PCP: Charlott Rakes, MD      Visit Date: 08/16/2017   Universal Protocol:    Date/Time: 08/16/2017  Consent Given By: the patient  Position: PRONE  Additional Comments: Vital signs were monitored before and after the procedure. Patient was prepped and draped in the usual sterile fashion. The correct patient, procedure, and site was verified.   Injection Procedure Details:  Procedure Site One Meds Administered:  Meds ordered this encounter  Medications  . betamethasone acetate-betamethasone sodium phosphate (CELESTONE) injection 12 mg    Laterality: Right L4 and Left L3  Location/Site:  L3-L4 L4-L5  Needle size: 22 G  Needle type: Spinal  Needle Placement: Transforaminal  Findings:    -Comments: Excellent flow of contrast along the nerve and into the epidural space.  Procedure Details: After squaring off the end-plates to get a true AP view, the C-arm was positioned so that an oblique view of the foramen as noted above was visualized. The target area is just inferior to the "nose of the scotty dog" or sub pedicular. The soft tissues overlying this structure were infiltrated with 2-3 ml. of 1% Lidocaine without Epinephrine.  The spinal needle was inserted toward the target using a "trajectory" view along the fluoroscope beam.  Under AP and lateral visualization, the needle was advanced so it did not puncture dura and was located close the 6 O'Clock position of the pedical in AP tracterory. Biplanar projections were used to confirm position. Aspiration was confirmed to be negative for CSF and/or blood. A 1-2 ml. volume of Isovue-250 was injected and flow of contrast was noted at each level. Radiographs were obtained for documentation purposes.   After attaining the desired flow of contrast  documented above, a 0.5 to 1.0 ml test dose of 0.25% Marcaine was injected into each respective transforaminal space.  The patient was observed for 90 seconds post injection.  After no sensory deficits were reported, and normal lower extremity motor function was noted,   the above injectate was administered so that equal amounts of the injectate were placed at each foramen (level) into the transforaminal epidural space.   Additional Comments:  The patient tolerated the procedure well Dressing: Band-Aid    Post-procedure details: Patient was observed during the procedure. Post-procedure instructions were reviewed.  Patient left the clinic in stable condition.

## 2017-08-23 NOTE — Telephone Encounter (Signed)
Tylenol #3 refill request, Can you please advise?

## 2017-08-25 ENCOUNTER — Other Ambulatory Visit: Payer: Self-pay | Admitting: Family Medicine

## 2017-08-25 DIAGNOSIS — G8929 Other chronic pain: Secondary | ICD-10-CM

## 2017-08-25 DIAGNOSIS — M5442 Lumbago with sciatica, left side: Principal | ICD-10-CM

## 2017-08-25 MED FILL — DULoxetine HCL 60 MG CPEP: 60 | 30 days supply | Qty: 30 | Fill #1

## 2017-08-25 MED FILL — NAPROXEN 500 MG TABLET: 500 | 30 days supply | Qty: 60 | Fill #3

## 2017-08-25 MED FILL — ATORVASTATIN 20 MG TABLET: 20 | 30 days supply | Qty: 30 | Fill #1

## 2017-08-25 MED FILL — CETIRIZINE HCL 10 MG TABLET: 10 | 30 days supply | Qty: 30 | Fill #0

## 2017-08-25 MED FILL — CYCLOBENZAPRINE 10 MG TAB: 10 | 30 days supply | Qty: 60 | Fill #0

## 2017-09-13 MED FILL — FLUTICASONE PROP 50 MCG SPR: 50 | 30 days supply | Qty: 16 | Fill #3

## 2017-09-13 MED FILL — OMEPRAZOLE 20 MG CAP: 20 | 30 days supply | Qty: 30 | Fill #2

## 2017-09-26 ENCOUNTER — Other Ambulatory Visit (INDEPENDENT_AMBULATORY_CARE_PROVIDER_SITE_OTHER): Payer: Self-pay | Admitting: Specialist

## 2017-09-26 MED FILL — CETIRIZINE HCL 10 MG TABLET: 10 | 30 days supply | Qty: 30 | Fill #1

## 2017-09-26 MED FILL — DULoxetine HCL 60 MG CPEP: 60 | 30 days supply | Qty: 30 | Fill #2

## 2017-09-26 MED FILL — BUTALB-ACETAMIN-CAFF 50-325: 50-325-40 | 30 days supply | Qty: 60 | Fill #1

## 2017-09-26 MED FILL — ATORVASTATIN 20 MG TABLET: 20 | 30 days supply | Qty: 30 | Fill #2

## 2017-09-26 MED FILL — CYCLOBENZAPRINE 10 MG TAB: 10 | 30 days supply | Qty: 60 | Fill #1

## 2017-09-26 NOTE — Telephone Encounter (Signed)
Naproxen refill request 

## 2017-09-27 MED FILL — NAPROXEN 500 MG TABLET: 500 | 30 days supply | Qty: 60 | Fill #0

## 2017-10-04 ENCOUNTER — Other Ambulatory Visit: Payer: Self-pay

## 2017-10-04 ENCOUNTER — Ambulatory Visit (HOSPITAL_COMMUNITY)
Admission: EM | Admit: 2017-10-04 | Discharge: 2017-10-04 | Disposition: A | Discharge status: Home / Self Care | Payer: Medicaid Other | Attending: Family Medicine | Admitting: Family Medicine

## 2017-10-04 ENCOUNTER — Encounter (HOSPITAL_COMMUNITY): Payer: Self-pay | Admitting: Emergency Medicine

## 2017-10-04 DIAGNOSIS — M5441 Lumbago with sciatica, right side: Secondary | ICD-10-CM | POA: Diagnosis not present

## 2017-10-04 DIAGNOSIS — G8929 Other chronic pain: Secondary | ICD-10-CM

## 2017-10-04 DIAGNOSIS — M5442 Lumbago with sciatica, left side: Secondary | ICD-10-CM

## 2017-10-04 MED ORDER — PREDNISONE 10 MG (21) PO TBPK: ORAL_TABLET | Freq: Every day | ORAL | 0 refills | Status: DC

## 2017-10-04 NOTE — ED Triage Notes (Signed)
The patient presented to the [facility] with a complaint of chronic lower back pain for years. The patient stated "I have sciatica."

## 2017-10-04 NOTE — ED Provider Notes (Signed)
[place]    CSN: [id number] Arrival date & time: 10/04/17  1114     History   Chief Complaint Chief Complaint  Patient presents with  . Back Pain    HPI Cheryl King is a 52 y.o. female history of hyperlipidemia, chronic back pain, hyperlipidemia presenting today for evaluation of back pain.  Patient states that her back pain has worsened over the past week.  Denies any new injury or increase in activity that triggered this.  Feels similar to her previous exacerbations of her back pain.  States that she has numbness and tingling that radiate into both legs and go to her feet with certain movements.  Denies any change in bowel or bladder habits.  Denies saddle anesthesia.  States that she has been taking muscle relaxers and Naprosyn with out relief.  Typically has improvement with prednisone.  Has follow-up with orthopedics on October 2.  HPI  Past Medical History:  Diagnosis Date  . Acid reflux   . Allergy   . Carpal tunnel syndrome   . Depression   . Diverticulitis   . Hyperlipidemia   . Low back pain   . Neck pain   . Sacroiliac inflammation ([place])   . Sciatica   . Seasonal allergies     Patient Active Problem List   Diagnosis Date Noted  . Hyperlipidemia 07/18/2017  . Ulnar neuropathy 04/18/2017  . GERD (gastroesophageal reflux disease) 10/19/2016  . Prediabetes 05/03/2016  . Cubital tunnel syndrome on right 03/01/2016  . Numbness and tingling of right arm 01/01/2016  . Chlamydia infection 01/27/2015  . Smoking 12/26/2013  . Chronic lower back pain 04/13/2013  . Sciatica 04/13/2013  . Chronic neck pain 04/13/2013  . Spondylosis of cervical spine 04/13/2013    Past Surgical History:  Procedure Laterality Date  . ABDOMINAL SURGERY    . ANKLE SURGERY     lt.  Marland Kitchen PARTIAL HYSTERECTOMY    . right breast cyst removed     . URETHRAL DIVERTICULUM REPAIR     uretrral diverticultis    OB History    Gravida  1   Para      Term      Preterm      AB  1   Living        SAB  1   TAB      Ectopic      Multiple      Live Births               Home Medications    Prior to Admission medications   Medication Sig Start Date End Date Taking? Authorizing Provider  acetaminophen-codeine (TYLENOL #3) 300-30 MG tablet TAKE 1 TABLET BY MOUTH EVERY 8 HOURS AS NEEDED FOR MODERATE PAIN. 08/23/17   Newt Minion, MD  atorvastatin (LIPITOR) 20 MG tablet Take 1 tablet (20 mg total) by mouth daily. 04/18/17   Charlott Rakes, MD  butalbital-acetaminophen-caffeine (FIORICET, ESGIC) 50-325-40 MG tablet Take 1 tablet by mouth every 12 (twelve) hours as needed for headache. 04/18/17 04/18/18  Charlott Rakes, MD  cetirizine (ZYRTEC) 10 MG tablet Take 1 tablet (10 mg total) by mouth daily. 04/18/17   Charlott Rakes, MD  cyclobenzaprine (FLEXERIL) 10 MG tablet TAKE 1 TABLET BY MOUTH 2 (TWO) TIMES DAILY AS NEEDED FOR MUSCLE SPASMS. 08/25/17   Charlott Rakes, MD  diclofenac sodium (VOLTAREN) 1 % GEL Apply 2 g topically 4 (four) times daily. 03/18/17   Jessy Oto, MD  DULoxetine (  CYMBALTA) 60 MG capsule Take 1 capsule (60 mg total) by mouth daily. 04/18/17   Charlott Rakes, MD  fluticasone (FLONASE) 50 MCG/ACT nasal spray Place 2 sprays into both nostrils daily. 04/18/17   Charlott Rakes, MD  Lactobacillus CAPS Take 1 capsule by mouth daily.    [provider]  Multiple Vitamin (MULTIVITAMIN WITH MINERALS) TABS tablet Take 1 tablet by mouth daily.    [provider]  naproxen (NAPROSYN) 500 MG tablet TAKE 1 TABLET BY MOUTH 2 TIMES DAILY WITH A MEAL. 09/26/17   Jessy Oto, MD  nicotine (NICODERM CQ) 14 mg/24hr patch Place 1 patch (14 mg total) onto the skin daily. For 4 weeks then 7mg /24hr daily for  4 weeks 07/18/17   Charlott Rakes, MD  omeprazole (PRILOSEC) 20 MG capsule Take 1 capsule (20 mg total) by mouth daily. 04/18/17   Charlott Rakes, MD  ondansetron (ZOFRAN) 4 MG tablet TAKE 1 TABLET BY MOUTH EVERY 8 HOURS AS NEEDED  FOR NAUSEA OR VOMITING. 10/19/16   Charlott Rakes, MD  predniSONE (STERAPRED UNI-PAK 21 TAB) 10 MG (21) TBPK tablet Take by mouth daily. Take 6 tabs by mouth daily for 2 days, then 5 tabs for 2 days, then 4 tabs for 2 days, then 3 tabs for 2 days, 2 tabs for 2 days, then 1 tab by mouth daily for 2 days 10/04/17   Yvett Rossel C, PA-C  pregabalin (LYRICA) 50 MG capsule Take 1 capsule (50 mg total) by mouth at bedtime. 07/18/17   Charlott Rakes, MD  RESTASIS 0.05 % ophthalmic emulsion  04/13/17   [provider]  traMADol (ULTRAM) 50 MG tablet Take by mouth every 6 (six) hours as needed.    [provider]  triamcinolone cream (KENALOG) 0.1 % Apply 1 application topically 2 (two) times daily. 07/18/17   Charlott Rakes, MD    Family History Family History  Problem Relation Age of Onset  . Healthy Mother   . Other Father        Unsure of medical history  . Asthma Maternal Aunt   . Diabetes Maternal Aunt   . Cancer Maternal Aunt   . Breast cancer Maternal Aunt   . Hypertension Maternal Grandmother   . Colon cancer Neg Hx   . Pancreatic cancer Neg Hx   . Rectal cancer Neg Hx   . Stomach cancer Neg Hx   . Colon polyps Neg Hx   . Esophageal cancer Neg Hx     Social History Social History   Tobacco Use  . Smoking status: Current Every Day Smoker    Packs/day: 0.50    Years: 35.00    Pack years: 17.50  . Smokeless tobacco: Never Used  Substance Use Topics  . Alcohol use: Yes    Comment: Social only - seldom  . Drug use: No     Allergies   Percocet [oxycodone-acetaminophen]; Vicodin [hydrocodone-acetaminophen]; and Penicillins   Review of Systems Review of Systems  Constitutional: Negative for fatigue and fever.  HENT: Negative for congestion, sinus pressure and sore throat.   Eyes: Negative for photophobia, pain and visual disturbance.  Respiratory: Negative for cough and shortness of breath.   Cardiovascular: Negative for chest pain.  Gastrointestinal:  Negative for abdominal pain, nausea and vomiting.  Genitourinary: Negative for decreased urine volume and hematuria.  Musculoskeletal: Positive for back pain. Negative for myalgias, neck pain and neck stiffness.  Neurological: Positive for numbness. Negative for dizziness, facial asymmetry, speech difficulty, weakness, light-headedness and headaches.  Physical Exam Triage Vital Signs ED Triage Vitals [10/04/17 1209]  Enc Vitals Group     BP 103/61     Pulse Rate 79     Resp 18     Temp 98.7 F (37.1 C)     Temp Source Oral     SpO2 96 %     Weight      Height      Head Circumference      Peak Flow      Pain Score 10     Pain Loc      Pain Edu?      Excl. in [place]?    No data found.  Updated Vital Signs BP 103/61 (BP Location: Left Arm)   Pulse 79   Temp 98.7 F (37.1 C) (Oral)   Resp 18   SpO2 96%   Visual Acuity Right Eye Distance:   Left Eye Distance:   Bilateral Distance:    Right Eye Near:   Left Eye Near:    Bilateral Near:     Physical Exam  Constitutional: She is oriented to person, place, and time. She appears well-developed and well-nourished.  No acute distress  HENT:  Head: Normocephalic and atraumatic.  Nose: Nose normal.  Eyes: Conjunctivae are normal.  Neck: Neck supple.  Cardiovascular: Normal rate.  Pulmonary/Chest: Effort normal. No respiratory distress.  Abdominal: She exhibits no distension.  Musculoskeletal: Normal range of motion.  Tenderness to palpation of lower lumbar spine midline as well as bilateral lumbar musculature, positive straight leg raise on right side, negative on left.  Strength 5/5 and equal bilaterally, patellar reflexes 2+ bilaterally.  Neurological: She is alert and oriented to person, place, and time.  Skin: Skin is warm and dry.  Psychiatric: She has a normal mood and affect.  Nursing note and vitals reviewed.    UC Treatments / Results  Labs (all labs ordered are listed, but only abnormal results are  displayed) Labs Reviewed - No data to display  EKG None  Radiology No results found.  Procedures Procedures (including critical care time)  Medications Ordered in UC Medications - No data to display  Initial Impression / Assessment and Plan / UC Course  I have reviewed the triage vital signs and the nursing notes.  Pertinent labs & imaging results that were available during my care of the patient were reviewed by me and considered in my medical decision making (see chart for details).     Patient with acute on chronic back pain with radicular distribution.  Will provide patient with prednisone taper, may continue muscle relaxers.  Discussed risk of GI bleeding with continuing this with Naprosyn.  Follow-up with orthopedics.  No red flags for cauda equina.Discussed strict return precautions. Patient verbalized understanding and is agreeable with plan.  Final Clinical Impressions(s) / UC Diagnoses   Final diagnoses:  Chronic bilateral low back pain with bilateral sciatica     Discharge Instructions     Please take prednisone taper with food- beginning with 6 tablets for the first 2 days and decrease to 5 tabs for day 3 and 4 and 4 tablets on day 5 and 6, 3 tabs on day 7 and 8, 2 tabs day 9 and 10, 1 tablet day 11 and 12  May continue muscle relaxers with this  Follow up with orthopedics    ED Prescriptions    Medication Sig Dispense Auth. Provider   predniSONE (STERAPRED UNI-PAK 21 TAB) 10 MG (21) TBPK tablet Take by  mouth daily. Take 6 tabs by mouth daily for 2 days, then 5 tabs for 2 days, then 4 tabs for 2 days, then 3 tabs for 2 days, 2 tabs for 2 days, then 1 tab by mouth daily for 2 days 42 tablet Tymar Polyak C, PA-C     Controlled Substance Prescriptions [place] Controlled Substance Registry consulted? Not Applicable   Janith Lima, [place] 10/04/17 1425

## 2017-10-04 NOTE — Discharge Instructions (Signed)
Please take prednisone taper with food- beginning with 6 tablets for the first 2 days and decrease to 5 tabs for day 3 and 4 and 4 tablets on day 5 and 6, 3 tabs on day 7 and 8, 2 tabs day 9 and 10, 1 tablet day 11 and 12  May continue muscle relaxers with this  Follow up with orthopedics

## 2017-10-05 ENCOUNTER — Other Ambulatory Visit: Payer: Self-pay | Admitting: Family Medicine

## 2017-10-05 DIAGNOSIS — R0982 Postnasal drip: Secondary | ICD-10-CM

## 2017-10-05 MED FILL — FLUTICASONE PROP 50 MCG SPR: 50 | 30 days supply | Qty: 16 | Fill #0

## 2017-10-11 MED FILL — OMEPRAZOLE 20 MG CAP: 20 | 30 days supply | Qty: 30 | Fill #3

## 2017-10-18 ENCOUNTER — Ambulatory Visit: Payer: Medicaid Other | Attending: Family Medicine | Admitting: Family Medicine

## 2017-10-18 ENCOUNTER — Encounter: Payer: Self-pay | Admitting: Family Medicine

## 2017-10-18 VITALS — BP 116/71 | HR 65 | Temp 98.0°F | Ht 67.0 in | Wt 141.0 lb

## 2017-10-18 DIAGNOSIS — G5621 Lesion of ulnar nerve, right upper limb: Secondary | ICD-10-CM | POA: Diagnosis not present

## 2017-10-18 DIAGNOSIS — M5442 Lumbago with sciatica, left side: Secondary | ICD-10-CM | POA: Diagnosis not present

## 2017-10-18 DIAGNOSIS — M5441 Lumbago with sciatica, right side: Secondary | ICD-10-CM | POA: Diagnosis not present

## 2017-10-18 DIAGNOSIS — K219 Gastro-esophageal reflux disease without esophagitis: Secondary | ICD-10-CM

## 2017-10-18 DIAGNOSIS — N39498 Other specified urinary incontinence: Secondary | ICD-10-CM | POA: Diagnosis not present

## 2017-10-18 DIAGNOSIS — G8929 Other chronic pain: Secondary | ICD-10-CM | POA: Insufficient documentation

## 2017-10-18 DIAGNOSIS — Z79899 Other long term (current) drug therapy: Secondary | ICD-10-CM | POA: Insufficient documentation

## 2017-10-18 DIAGNOSIS — E7800 Pure hypercholesterolemia, unspecified: Secondary | ICD-10-CM | POA: Insufficient documentation

## 2017-10-18 DIAGNOSIS — M549 Dorsalgia, unspecified: Secondary | ICD-10-CM | POA: Diagnosis present

## 2017-10-18 DIAGNOSIS — R51 Headache: Secondary | ICD-10-CM | POA: Diagnosis not present

## 2017-10-18 DIAGNOSIS — R7303 Prediabetes: Secondary | ICD-10-CM | POA: Insufficient documentation

## 2017-10-18 DIAGNOSIS — Z23 Encounter for immunization: Secondary | ICD-10-CM | POA: Insufficient documentation

## 2017-10-18 MED ORDER — DULOXETINE HCL 60 MG PO CPEP: 60.0000 mg | ORAL_CAPSULE | Freq: Every day | ORAL | 1 refills | Status: DC

## 2017-10-18 MED ORDER — ATORVASTATIN CALCIUM 20 MG PO TABS: 20.0000 mg | ORAL_TABLET | Freq: Every day | ORAL | 1 refills | Status: DC

## 2017-10-18 MED ORDER — KETOROLAC TROMETHAMINE 60 MG/2ML IM SOLN
60.0000 mg | Freq: Once | INTRAMUSCULAR | Status: AC
  Administered 2017-10-18: 60 mg via INTRAMUSCULAR

## 2017-10-18 MED ORDER — CYCLOBENZAPRINE HCL 10 MG PO TABS: ORAL_TABLET | ORAL | 2 refills | Status: DC

## 2017-10-18 MED ORDER — OMEPRAZOLE 20 MG PO CPDR: 20.0000 mg | DELAYED_RELEASE_CAPSULE | Freq: Every day | ORAL | 1 refills | Status: DC

## 2017-10-18 MED FILL — DULoxetine HCL 60 MG CPEP: 60 | 90 days supply | Qty: 90 | Fill #0

## 2017-10-18 MED FILL — ATORVASTATIN 20 MG TABLET: 20 | 90 days supply | Qty: 90 | Fill #0

## 2017-10-18 MED FILL — CYCLOBENZAPRINE 10 MG TAB: 10 | 30 days supply | Qty: 60 | Fill #0

## 2017-10-18 NOTE — Patient Instructions (Signed)

## 2017-10-18 NOTE — Progress Notes (Signed)
Subjective:  Patient ID: Cheryl King, female    DOB: 1965-05-25  Age: 52 y.o. MRN: [id number]  CC: Back Pain   HPI Cheryl King  is 52 year old female with Medical history  significant for Prediabetes (diet controlled A1c 5.8) chronic low back pain with sciatica, GERD, hyperlipidemia tobacco abuse here for a follow-up visit. In 07/2017 she received an epidural spinal injection by Dr. Ernestina Patches but reports no improvement in symptoms.  She also had an ED visit 2 weeks ago as she was prescribed a Medrol Dosepak which she took for only 2 days due to headaches.  She also noticed she had headaches when she received her epidural spinal injection 2 months ago but states in the past she never had headaches with the prednisone pack. Pain radiates to both lower extremities and she has had no recent falls as she has been cautious.  She has no loss of sphincteric functions or neuropathy in her lower extremities but does have some neuropathy in her right hand for which she takes Lyrica.  She is trying to put off surgery for as long as she can. Her reflux is controlled and she is doing well on her statin with no complaints of myalgias.  Prediabetes is diet controlled.  Past Medical History:  Diagnosis Date  . Acid reflux   . Allergy   . Carpal tunnel syndrome   . Depression   . Diverticulitis   . Hyperlipidemia   . Low back pain   . Neck pain   . Sacroiliac inflammation ([place])   . Sciatica   . Seasonal allergies     Past Surgical History:  Procedure Laterality Date  . ABDOMINAL SURGERY    . ANKLE SURGERY     lt.  Marland Kitchen PARTIAL HYSTERECTOMY    . right breast cyst removed     . URETHRAL DIVERTICULUM REPAIR     uretrral diverticultis    Allergies  Allergen Reactions  . Percocet [Oxycodone-Acetaminophen] Nausea Only  . Vicodin [Hydrocodone-Acetaminophen] Nausea Only  . Penicillins Rash     Outpatient Medications Prior to Visit  Medication Sig Dispense Refill  .  acetaminophen-codeine (TYLENOL #3) 300-30 MG tablet TAKE 1 TABLET BY MOUTH EVERY 8 HOURS AS NEEDED FOR MODERATE PAIN. 30 tablet 0  . butalbital-acetaminophen-caffeine (FIORICET, ESGIC) 50-325-40 MG tablet Take 1 tablet by mouth every 12 (twelve) hours as needed for headache. 60 tablet 1  . cetirizine (ZYRTEC) 10 MG tablet Take 1 tablet (10 mg total) by mouth daily. 90 tablet 1  . diclofenac sodium (VOLTAREN) 1 % GEL Apply 2 g topically 4 (four) times daily. 5 Tube 2  . fluticasone (FLONASE) 50 MCG/ACT nasal spray PLACE 2 SPRAYS INTO BOTH NOSTRILS DAILY. 16 g 2  . Lactobacillus CAPS Take 1 capsule by mouth daily.    . Multiple Vitamin (MULTIVITAMIN WITH MINERALS) TABS tablet Take 1 tablet by mouth daily.    . naproxen (NAPROSYN) 500 MG tablet TAKE 1 TABLET BY MOUTH 2 TIMES DAILY WITH A MEAL. 60 tablet 3  . ondansetron (ZOFRAN) 4 MG tablet TAKE 1 TABLET BY MOUTH EVERY 8 HOURS AS NEEDED FOR NAUSEA OR VOMITING. 60 tablet 1  . pregabalin (LYRICA) 50 MG capsule Take 1 capsule (50 mg total) by mouth at bedtime. 30 capsule 5  . RESTASIS 0.05 % ophthalmic emulsion   3  . traMADol (ULTRAM) 50 MG tablet Take by mouth every 6 (six) hours as needed.    . triamcinolone cream (KENALOG) 0.1 % Apply 1 application topically  2 (two) times daily. 45 g 1  . atorvastatin (LIPITOR) 20 MG tablet Take 1 tablet (20 mg total) by mouth daily. 90 tablet 1  . cyclobenzaprine (FLEXERIL) 10 MG tablet TAKE 1 TABLET BY MOUTH 2 (TWO) TIMES DAILY AS NEEDED FOR MUSCLE SPASMS. 60 tablet 2  . DULoxetine (CYMBALTA) 60 MG capsule Take 1 capsule (60 mg total) by mouth daily. 90 capsule 1  . omeprazole (PRILOSEC) 20 MG capsule Take 1 capsule (20 mg total) by mouth daily. 90 capsule 1  . nicotine (NICODERM CQ) 14 mg/24hr patch Place 1 patch (14 mg total) onto the skin daily. For 4 weeks then 7mg /24hr daily for  4 weeks (Patient not taking: Reported on 10/18/2017) 28 patch 1  . predniSONE (STERAPRED UNI-PAK 21 TAB) 10 MG (21) TBPK tablet Take  by mouth daily. Take 6 tabs by mouth daily for 2 days, then 5 tabs for 2 days, then 4 tabs for 2 days, then 3 tabs for 2 days, 2 tabs for 2 days, then 1 tab by mouth daily for 2 days (Patient not taking: Reported on 10/18/2017) 42 tablet 0   Facility-Administered Medications Prior to Visit  Medication Dose Route Frequency Provider Last Rate Last Dose  . 0.9 %  sodium chloride infusion  500 mL Intravenous Once Nelida Meuse III, MD        ROS Review of Systems  Constitutional: Negative for activity change, appetite change and fatigue.  HENT: Negative for congestion, sinus pressure and sore throat.   Eyes: Negative for visual disturbance.  Respiratory: Negative for cough, chest tightness, shortness of breath and wheezing.   Cardiovascular: Negative for chest pain and palpitations.  Gastrointestinal: Negative for abdominal distention, abdominal pain and constipation.  Endocrine: Negative for polydipsia.  Genitourinary: Negative for dysuria and frequency.  Musculoskeletal:       See hpi  Skin: Negative for rash.  Neurological: Negative for tremors, light-headedness and numbness.  Hematological: Does not bruise/bleed easily.  Psychiatric/Behavioral: Negative for agitation and behavioral problems.    Objective:  BP 116/71   Pulse 65   Temp 98 F (36.7 C) (Oral)   Ht 5\' 7"  (1.702 m)   Wt 141 lb (64 kg)   SpO2 94%   BMI 22.08 kg/m   BP/Weight 10/18/2017 10/04/2017 08/12/3662  Systolic BP 403 474 259  Diastolic BP 71 61 68  Wt. (Lbs) 141 - -  BMI 22.08 - -      Physical Exam  Constitutional: She is oriented to person, place, and time. She appears well-developed and well-nourished.  Cardiovascular: Normal rate, normal heart sounds and intact distal pulses.  No murmur heard. Pulmonary/Chest: Effort normal and breath sounds normal. She has no wheezes. She has no rales. She exhibits no tenderness.  Abdominal: Soft. Bowel sounds are normal. She exhibits no distension and no mass.  There is no tenderness.  Musculoskeletal:  No tenderness palpation of spine but tenderness elicited on palpation of the right buttock cheek. Positive straight leg raise bilaterally  Neurological: She is alert and oriented to person, place, and time. She displays normal reflexes. No sensory deficit. She exhibits normal muscle tone.  Skin: Skin is warm and dry.  Psychiatric: She has a normal mood and affect.    Lab Results  Component Value Date   HGBA1C 5.8 10/19/2016    Assessment & Plan:   1. Gastroesophageal reflux disease without esophagitis Controlled - omeprazole (PRILOSEC) 20 MG capsule; Take 1 capsule (20 mg total) by mouth daily.  Dispense:  90 capsule; Refill: 1  2. Chronic midline low back pain with bilateral sciatica Uncontrolled Received epidural injection 2 months ago with no relief Discussed strengthening exercises and yoga - Ambulatory referral to Physical Therapy - DULoxetine (CYMBALTA) 60 MG capsule; Take 1 capsule (60 mg total) by mouth daily.  Dispense: 90 capsule; Refill: 1 - cyclobenzaprine (FLEXERIL) 10 MG tablet; TAKE 1 TABLET BY MOUTH 2 (TWO) TIMES DAILY AS NEEDED FOR MUSCLE SPASMS.  Dispense: 60 tablet; Refill: 2 - ketorolac (TORADOL) injection 60 mg  3. Pure hypercholesterolemia Controlled Low-cholesterol diet - atorvastatin (LIPITOR) 20 MG tablet; Take 1 tablet (20 mg total) by mouth daily.  Dispense: 90 tablet; Refill: 1  4. Prediabetes Diet controlled with A1c of 5.8  5. Other urinary incontinence Declines initiation of medication She is working on [employer] exercises  6. Ulnar neuropathy of right upper extremity Currently on Lyrica  7. Need for immunization against influenza - Flu Vaccine QUAD 36+ mos IM   Meds ordered this encounter  Medications  . omeprazole (PRILOSEC) 20 MG capsule    Sig: Take 1 capsule (20 mg total) by mouth daily.    Dispense:  90 capsule    Refill:  1  . DULoxetine (CYMBALTA) 60 MG capsule    Sig: Take 1 capsule  (60 mg total) by mouth daily.    Dispense:  90 capsule    Refill:  1    90 day supply  . cyclobenzaprine (FLEXERIL) 10 MG tablet    Sig: TAKE 1 TABLET BY MOUTH 2 (TWO) TIMES DAILY AS NEEDED FOR MUSCLE SPASMS.    Dispense:  60 tablet    Refill:  2  . atorvastatin (LIPITOR) 20 MG tablet    Sig: Take 1 tablet (20 mg total) by mouth daily.    Dispense:  90 tablet    Refill:  1    90 day supply  . ketorolac (TORADOL) injection 60 mg    Follow-up: Return in about 3 months (around 01/18/2018) for [place] up of chronic medical conditions.   Charlott Rakes MD

## 2017-10-19 ENCOUNTER — Encounter (INDEPENDENT_AMBULATORY_CARE_PROVIDER_SITE_OTHER): Payer: Self-pay | Admitting: Specialist

## 2017-10-19 ENCOUNTER — Ambulatory Visit (INDEPENDENT_AMBULATORY_CARE_PROVIDER_SITE_OTHER): Payer: Medicaid Other | Admitting: Specialist

## 2017-10-19 VITALS — BP 106/74 | HR 64 | Ht 67.0 in | Wt 147.0 lb

## 2017-10-19 DIAGNOSIS — M7061 Trochanteric bursitis, right hip: Secondary | ICD-10-CM | POA: Diagnosis not present

## 2017-10-19 DIAGNOSIS — M7062 Trochanteric bursitis, left hip: Secondary | ICD-10-CM | POA: Diagnosis not present

## 2017-10-19 DIAGNOSIS — G5620 Lesion of ulnar nerve, unspecified upper limb: Secondary | ICD-10-CM | POA: Diagnosis not present

## 2017-10-19 NOTE — Patient Instructions (Signed)
Avoid frequent bending and stooping  No lifting greater than 10 lbs. May use ice or moist heat for pain. Weight loss is of benefit. Best medication for lumbar disc disease is arthritis medications like motrin, celebrex and naprosyn. Exercise is important to improve your indurance and does allow people to function better inspite of back pain.  Carpal Tunnel Syndrome  Carpal tunnel syndrome is a condition that causes pain in your hand and arm. The carpal tunnel is a narrow area located on the palm side of your wrist. Repeated wrist motion or certain diseases may cause swelling within the tunnel. This swelling pinches the main nerve in the wrist (median nerve). What are the causes? This condition may be caused by:  Repeated wrist motions.  Wrist injuries.  Arthritis.  A cyst or tumor in the carpal tunnel.  Fluid buildup during pregnancy. Sometimes the cause of this condition is not known. What increases the risk? This condition is more likely to develop in:  People who have jobs that cause them to repeatedly move their wrists in the same motion, such as Art gallery manager.  Women.  People with certain conditions, such as: ? Diabetes. ? Obesity. ? An underactive thyroid (hypothyroidism). ? Kidney failure. What are the signs or symptoms? Symptoms of this condition include:  A tingling feeling in your fingers, especially in your thumb, index, and middle fingers.  Tingling or numbness in your hand.  An aching feeling in your entire arm, especially when your wrist and elbow are bent for long periods of time.  Wrist pain that goes up your arm to your shoulder.  Pain that goes down into your palm or fingers.  A weak feeling in your hands. You may have trouble grabbing and holding items. Your symptoms may feel worse during the night. How is this diagnosed? This condition is diagnosed with a medical history and physical exam. You may also have tests, including:  An  electromyogram (EMG). This test measures electrical signals sent by your nerves into the muscles.  X-rays. How is this treated? Treatment for this condition includes:  Lifestyle changes. It is important to stop doing or modify the activity that caused your condition.  Physical or occupational therapy.  Medicines for pain and inflammation. This may include medicine that is injected into your wrist.  A wrist splint.  Surgery. Follow these instructions at home: If you have a splint:   Wear it as told by your health care provider. Remove it only as told by your health care provider.  Loosen the splint if your fingers become numb and tingle, or if they turn cold and blue.  Keep the splint clean and dry. General instructions   Take over-the-counter and prescription medicines only as told by your health care provider.  Rest your wrist from any activity that may be causing your pain. If your condition is work related, talk to your employer about changes that can be made, such as getting a wrist pad to use while typing.  If directed, apply ice to the painful area: ? Put ice in a plastic bag. ? Place a towel between your skin and the bag. ? Leave the ice on for 20 minutes, 2-3 times per day.  Keep all follow-up visits as told by your health care provider. This is important.  Do any exercises as told by your health care provider, physical therapist, or occupational therapist. Contact a health care provider if:  You have new symptoms.  Your pain is not controlled  with medicines.  Your symptoms get worse. This information is not intended to replace advice given to you by your health care provider. Make sure you discuss any questions you have with your health care provider. Document Released: 01/02/2000 Document Revised: 05/15/2015 Document Reviewed: 09/15/2016 Elsevier Interactive Patient Education  2017 [employer].

## 2017-10-19 NOTE — Progress Notes (Signed)
Office Visit Note   Patient: Cheryl King           Date of Birth: 1966/01/08           MRN: [id number] Visit Date: 10/19/2017              Requested by: Charlott Rakes, MD [place], [place] [address] PCP: Charlott Rakes, MD   Assessment & Plan: Visit Diagnoses:  1. Trochanteric bursitis, left hip   2. Trochanteric bursitis, right hip   3. 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 May use ice or moist heat for pain. Weight loss is of benefit. Best medication for lumbar disc disease is arthritis medications like motrin, celebrex and naprosyn. Exercise is important to improve your indurance and does allow people to function better inspite of back pain.  Carpal Tunnel Syndrome  Carpal tunnel syndrome is a condition that causes pain in your hand and arm. The carpal tunnel is a narrow area located on the palm side of your wrist. Repeated wrist motion or certain diseases may cause swelling within the tunnel. This swelling pinches the main nerve in the wrist (median nerve). What are the causes? This condition may be caused by:  Repeated wrist motions.  Wrist injuries.  Arthritis.  A cyst or tumor in the carpal tunnel.  Fluid buildup during pregnancy. Sometimes the cause of this condition is not known. What increases the risk? This condition is more likely to develop in:  People who have jobs that cause them to repeatedly move their wrists in the same motion, such as Art gallery manager.  Women.  People with certain conditions, such as: ? Diabetes. ? Obesity. ? An underactive thyroid (hypothyroidism). ? Kidney failure. What are the signs or symptoms? Symptoms of this condition include:  A tingling feeling in your fingers, especially in your thumb, index, and middle fingers.  Tingling or numbness in your hand.  An aching feeling in your entire  arm, especially when your wrist and elbow are bent for long periods of time.  Wrist pain that goes up your arm to your shoulder.  Pain that goes down into your palm or fingers.  A weak feeling in your hands. You may have trouble grabbing and holding items. Your symptoms may feel worse during the night. How is this diagnosed? This condition is diagnosed with a medical history and physical exam. You may also have tests, including:  An electromyogram (EMG). This test measures electrical signals sent by your nerves into the muscles.  X-rays. How is this treated? Treatment for this condition includes:  Lifestyle changes. It is important to stop doing or modify the activity that caused your condition.  Physical or occupational therapy.  Medicines for pain and inflammation. This may include medicine that is injected into your wrist.  A wrist splint.  Surgery. Follow these instructions at home: If you have a splint:   Wear it as told by your health care provider. Remove it only as told by your health care provider.  Loosen the splint if your fingers become numb and tingle, or if they turn cold and blue.  Keep the splint clean and dry. General instructions   Take over-the-counter and prescription medicines only as told by your health care provider.  Rest your wrist from any activity that may be causing your pain. If your condition is work related, talk to your employer  about changes that can be made, such as getting a wrist pad to use while typing.  If directed, apply ice to the painful area: ? Put ice in a plastic bag. ? Place a towel between your skin and the bag. ? Leave the ice on for 20 minutes, 2-3 times per day.  Keep all follow-up visits as told by your health care provider. This is important.  Do any exercises as told by your health care provider, physical therapist, or occupational therapist. Contact a health care provider if:  You have new symptoms.  Your pain is  not controlled with medicines.  Your symptoms get worse. This information is not intended to replace advice given to you by your health care provider. Make sure you discuss any questions you have with your health care provider. Document Released: 01/02/2000 Document Revised: 05/15/2015 Document Reviewed: 09/15/2016 Elsevier Interactive Patient Education  2017 [employer]  Follow-Up Instructions: Return in about 6 months (around 04/20/2018).   Orders:  No orders of the defined types were placed in this encounter.  No orders of the defined types were placed in this encounter.     Procedures: No procedures performed   Clinical Data: No additional findings.   Subjective: Chief Complaint  Patient presents with  . Lower Back - Follow-up    She had Right L3-4,L4-5 TF on 08/16/17 with Dr. Ernestina Patches and she says she has got about 25% relief.    52 year old female with history of back pain and neck pain "always". She presented to the ER 9/17 with severe back and leg pain, more in the back and into the buttocks both sides. Used to be just one side now it is in both. Nobowel or bladder difficulties. Worse with bending and stooping and lifting. After sitting has to get up to move. Jeneen Rinks wanted to consider bursitis shots bilaterally. She has taken prednisone dose pak in the past without headache, the steroid prescribed by the ER not in a pak and gave her a headache. Also steriod shots in the past one hour post ESI headaches.  She is able to walk, is only walking aroung her house not for exercise due to pain in her back with exercise.    Review of Systems  Constitutional: Negative.   HENT: Negative.   Eyes: Negative.   Respiratory: Negative.   Cardiovascular: Negative.   Gastrointestinal: Negative.   Endocrine: Negative.   Genitourinary: Negative.   Musculoskeletal: Negative.   Skin: Negative.   Allergic/Immunologic: Negative.   Neurological: Negative.   Psychiatric/Behavioral: Negative.       Objective: Vital Signs: BP 106/74 (BP Location: Left Arm, Patient Position: Sitting)   Pulse 64   Ht 5\' 7"  (1.702 m)   Wt 147 lb (66.7 kg)   BMI 23.02 kg/m   Physical Exam  Constitutional: She appears well-developed and well-nourished. No distress.  HENT:  Head: Normocephalic and atraumatic.  Mouth/Throat: No oropharyngeal exudate.  Eyes: Pupils are equal, round, and reactive to light. EOM are normal. Right eye exhibits discharge. Left eye exhibits no discharge.  Neck: No JVD present. No tracheal deviation present. No thyromegaly present.  Cardiovascular: Normal rate, regular rhythm, normal heart sounds and intact distal pulses. Exam reveals no gallop and no friction rub.  No murmur heard. Pulmonary/Chest: Effort normal and breath sounds normal. No stridor. No respiratory distress. She has no wheezes. She has no rales. She exhibits no tenderness.  Abdominal: She exhibits no distension and no mass. There is no tenderness. There  is no rebound and no guarding. No hernia.  Musculoskeletal: She exhibits no edema, tenderness or deformity.  Lymphadenopathy:    She has no cervical adenopathy.  Neurological: She displays normal reflexes. No cranial nerve deficit or sensory deficit. She exhibits normal muscle tone. Coordination normal.  Skin: No rash noted. She is not diaphoretic. No erythema. No pallor.  Psychiatric: She has a normal mood and affect. Her behavior is normal. Judgment and thought content normal.    Ortho Exam  Specialty Comments:  No specialty comments available.  Imaging: No results found.   PMFS History: Patient Active Problem List   Diagnosis Date Noted  . Hyperlipidemia 07/18/2017  . Ulnar neuropathy 04/18/2017  . GERD (gastroesophageal reflux disease) 10/19/2016  . Prediabetes 05/03/2016  . Cubital tunnel syndrome on right 03/01/2016  . Numbness and tingling of right arm 01/01/2016  . Chlamydia infection 01/27/2015  . Smoking 12/26/2013  . Chronic  lower back pain 04/13/2013  . Sciatica 04/13/2013  . Chronic neck pain 04/13/2013  . Spondylosis of cervical spine 04/13/2013   Past Medical History:  Diagnosis Date  . Acid reflux   . Allergy   . Carpal tunnel syndrome   . Depression   . Diverticulitis   . Hyperlipidemia   . Low back pain   . Neck pain   . 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 Sciatica   . Seasonal allergies     Family History  Problem Relation Age of Onset  . Healthy Mother   . Other Father        Unsure of medical history  . Asthma Maternal Aunt   . Diabetes Maternal Aunt   . Cancer Maternal Aunt   . Breast cancer Maternal Aunt   . Hypertension Maternal Grandmother   . Colon cancer Neg Hx   . Pancreatic cancer Neg Hx   . Rectal cancer Neg Hx   . Stomach cancer Neg Hx   . Colon polyps Neg Hx   . Esophageal cancer Neg Hx     Past Surgical History:  Procedure Laterality Date  . ABDOMINAL SURGERY    . ANKLE SURGERY     lt.  Marland Kitchen PARTIAL HYSTERECTOMY    . right breast cyst removed     . URETHRAL DIVERTICULUM REPAIR     uretrral diverticultis   Social History   Occupational History  . Occupation: Unemployed  Tobacco Use  . Smoking status: Current Every Day Smoker    Packs/day: 0.50    Years: 35.00    Pack years: 17.50  . Smokeless tobacco: Never Used  Substance and Sexual Activity  . Alcohol use: Yes    Comment: Social only - seldom  . Drug use: No  . Sexual activity: Never

## 2017-10-28 DIAGNOSIS — H1013 Acute atopic conjunctivitis, bilateral: Secondary | ICD-10-CM | POA: Diagnosis not present

## 2017-11-03 MED FILL — FLUTICASONE PROP 50 MCG SPR: 50 | 30 days supply | Qty: 16 | Fill #1

## 2017-11-03 MED FILL — NAPROXEN 500 MG TABLET: 500 | 30 days supply | Qty: 60 | Fill #1

## 2017-11-22 MED FILL — OMEPRAZOLE 20 MG CAP: 20 | 30 days supply | Qty: 30 | Fill #4

## 2017-12-01 ENCOUNTER — Encounter: Payer: Self-pay | Admitting: Physical Therapy

## 2017-12-01 ENCOUNTER — Other Ambulatory Visit: Payer: Self-pay

## 2017-12-01 ENCOUNTER — Ambulatory Visit: Payer: Medicaid Other | Attending: Family Medicine | Admitting: Physical Therapy

## 2017-12-01 DIAGNOSIS — M6281 Muscle weakness (generalized): Secondary | ICD-10-CM | POA: Diagnosis not present

## 2017-12-01 DIAGNOSIS — M5441 Lumbago with sciatica, right side: Secondary | ICD-10-CM | POA: Insufficient documentation

## 2017-12-01 DIAGNOSIS — G8929 Other chronic pain: Secondary | ICD-10-CM | POA: Diagnosis not present

## 2017-12-01 DIAGNOSIS — M5442 Lumbago with sciatica, left side: Secondary | ICD-10-CM | POA: Insufficient documentation

## 2017-12-01 MED FILL — FLUTICASONE PROP 50 MCG SPR: 50 | 30 days supply | Qty: 16 | Fill #2

## 2017-12-01 NOTE — Therapy (Signed)
[place], [place], [address] Phone: [phone]   Fax:  [phone]  Physical Therapy Evaluation  Patient Details  Name: Kenzly Rogoff MRN: [id number] Date of Birth: 1965/10/25 Referring Provider (PT): Charlott Rakes, MD   Encounter Date: 12/01/2017  PT End of Session - 12/01/17 0808    Visit Number  1    Authorization Type  MCD- waiting for auth    PT Start Time  0804    PT Stop Time  0839    PT Time Calculation (min)  35 min    Activity Tolerance  Patient tolerated treatment well    Behavior During Therapy  [facility] for tasks assessed/performed       Past Medical History:  Diagnosis Date  . Acid reflux   . Allergy   . Carpal tunnel syndrome   . Depression   . Diverticulitis   . Hyperlipidemia   . Low back pain   . Neck pain   . 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 Sciatica   . Seasonal allergies     Past Surgical History:  Procedure Laterality Date  . ABDOMINAL SURGERY    . ANKLE SURGERY     lt.  Marland Kitchen PARTIAL HYSTERECTOMY    . right breast cyst removed     . URETHRAL DIVERTICULUM REPAIR     uretrral diverticultis    There were no vitals filed for this visit.   Subjective Assessment - 12/01/17 0809    Subjective  Been having back pain since about 20009. It just got wrose since then, It used to just go down Rt leg but now it goes down both- began about last year. 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 Pains down legs are completely random. Had steroid injections a couple of months ago- the first one decreased pain for a short time and second time had HA. Took prednisone but that gave me a HA also. I try to stretch and exercise every once in a while.     How long can you sit comfortably?  <10 min    How long can you walk comfortably?  can walk grocery store    Patient Stated Goals  decrease pain, vacuum, yard work    Currently in Pain?  Yes    Pain Score  5      Pain Location  Back    Pain Orientation  Lower;Right;Left    Pain Descriptors / Indicators  Aching;Dull;Spasm    Aggravating Factors   sitting/standing long periods    Pain Relieving Factors  lay down, sometimes ice/heat         [facility] PT Assessment - 12/01/17 0001      Assessment   Medical Diagnosis  LBP with bil sciatica    Referring Provider (PT)  Charlott Rakes, MD    Onset Date/Surgical Date  01/19/07   approx   Prior Therapy  not for back pain      Precautions   Precautions  None      Restrictions   Weight Bearing Restrictions  No      Balance Screen   Has the patient fallen in the past 6 months  Yes    How many times?  1    Has the patient had a decrease in activity level because of a fear of falling?   Yes    Is the patient reluctant to leave their home because of a fear of falling?  No      Home Film/video editor residence    Additional Comments  steps from outside      Prior Function   Level of Independence  Independent    Vocation Requirements  not working      Cognition   Overall Cognitive Status  Within Functional Limits for tasks assessed      Observation/Other Assessments   Focus on Therapeutic Outcomes (FOTO)   --   MCD     Sensation   Additional Comments  tingling bil LE      Posture/Postural Control   Posture Comments  Rt shoulder depression, bil winging scapula; bil knee recurvatum      ROM / Strength   AROM / PROM / Strength  AROM;PROM;Strength      AROM   AROM Assessment Site  Lumbar    Lumbar - Right Side Bend  50% with pain on Rt side    Lumbar - Left Side Bend  WFL pain on left side      PROM   Overall PROM Comments  Limited hip IR on Right      Strength   Strength Assessment Site  Hip    Right/Left Hip  Right;Left    Right Hip Flexion  4-/5    Right Hip Extension  3/5    Right Hip ABduction  4/5    Left Hip Flexion  4-/5    Left Hip Extension  3/5    Left Hip ABduction  4/5      Palpation    Palpation comment  Lt SIJ TTP                Objective measurements completed on examination: See above findings.      [facility] Adult PT Treatment/Exercise - 12/01/17 0001      Exercises   Exercises  Lumbar      Lumbar Exercises: Stretches   Lower Trunk Rotation  5 reps    Figure 4 Stretch  2 reps;30 seconds   both     Lumbar Exercises: Supine   Pelvic Tilt Limitations  ball squeeze + PPT   supine & seated              PT Short Term Goals - 12/01/17 0849      PT SHORT TERM GOAL #1   Title  Pt will be able to demonstrate proper core contraction in supine, seated and standing postures    Baseline  began educating at eval    Time  5    Period  Weeks    Status  New    Target Date  01/02/18      PT SHORT TERM GOAL #2   Title  Pt will be independent in short term HEP as it has been established at target date    Baseline  began establishing at eval    Time  5    Period  Weeks    Status  New    Target Date  01/02/18        PT Long Term Goals - 12/01/17 0853      PT LONG TERM GOAL #1   Title  Pt will be indpendent in long term HEP for continued care    Baseline  will progress and establish as appropriate    Time  10   time to accommodate for MCD auth   Period  Weeks    Status  New    Target  Date  02/10/18      PT LONG TERM GOAL #2   Title  gross hip strength 5/5    Baseline  see flowsheet    Time  10    Period  Weeks    Status  New    Target Date  02/10/18      PT LONG TERM GOAL #3   Title  pt will demo lumbar AROM equal side to side without increased pain    Baseline  see flowsheet    Time  10    Period  Weeks    Status  New    Target Date  02/10/18      PT LONG TERM GOAL #4   Title  Pt will be able to tolerate a seated position for at least 30 min     Baseline  <10 min at eval    Time  10    Period  Weeks    Status  New    Target Date  02/10/18      PT LONG TERM GOAL #5   Title  Pt will be able to stand/walk for at least 1 hour     Baseline  unable at eval    Time  10    Period  Weeks    Status  New    Target Date  02/10/18             Plan - 12/01/17 0845    Clinical Impression Statement  Pt presents to PT with complaints of chronic LBP that has progressed to increased distal symptoms. Pt has significant flexibility in joints with poor strength resulting in spasm and tightness of hip and lumbar musculature. Weakness noted in core. Pt will benefit from skilled PT in order to decrease spasm and improve lumbopelvic stability and meet functional goals    History and Personal Factors relevant to plan of care:  chronic LBP    Clinical Presentation  Evolving    Clinical Presentation due to:  increasing distal symptoms    Clinical Decision Making  Moderate    Rehab Potential  Good    PT Frequency  --   3 visits in first auth followed by 2/week 4 weeks   PT Treatment/Interventions  ADLs/Self Care Home Management;Cryotherapy;Electrical Stimulation;Ultrasound;Traction;Moist Heat;Therapeutic activities;Therapeutic exercise;Manual techniques;Patient/family education;Passive range of motion;Dry needling;Taping    PT Next Visit Plan  continue core strengthening, manual therapy/TPDN PRN, glut strengthening    PT Home Exercise Plan  ball squeeze with PPT, LTR, figure 4    Consulted and Agree with Plan of Care  Patient       Patient will benefit from skilled therapeutic intervention in order to improve the following deficits and impairments:  Difficulty walking, Increased muscle spasms, Decreased activity tolerance, Hypermobility, Pain, Improper body mechanics, Postural dysfunction, Decreased strength  Visit Diagnosis: Chronic bilateral low back pain with bilateral sciatica - Plan: PT plan of care cert/re-cert  Muscle weakness (generalized) - Plan: PT plan of care cert/re-cert     Problem List Patient Active Problem List   Diagnosis Date Noted  . Hyperlipidemia 07/18/2017  . Ulnar neuropathy 04/18/2017  . GERD  (gastroesophageal reflux disease) 10/19/2016  . Prediabetes 05/03/2016  . Cubital tunnel syndrome on right 03/01/2016  . Numbness and tingling of right arm 01/01/2016  . Chlamydia infection 01/27/2015  . Smoking 12/26/2013  . Chronic lower back pain 04/13/2013  . Sciatica 04/13/2013  . Chronic neck pain 04/13/2013  . Spondylosis of cervical spine 04/13/2013  Amilyah Nack C. Vasil Juhasz PT, DPT 12/01/17 9:04 AM   [facility] [address] [place], [place], [address] Phone: [phone]   Fax:  [phone]  Name: Patria Warzecha MRN: [id number] Date of Birth: 21-Jul-1965

## 2017-12-13 ENCOUNTER — Ambulatory Visit: Payer: Medicaid Other | Admitting: Physical Therapy

## 2017-12-14 ENCOUNTER — Other Ambulatory Visit: Payer: Self-pay

## 2017-12-14 ENCOUNTER — Encounter (HOSPITAL_BASED_OUTPATIENT_CLINIC_OR_DEPARTMENT_OTHER): Payer: Self-pay | Admitting: *Deleted

## 2017-12-14 ENCOUNTER — Emergency Department (HOSPITAL_BASED_OUTPATIENT_CLINIC_OR_DEPARTMENT_OTHER)
Admission: EM | Admit: 2017-12-14 | Discharge: 2017-12-14 | Disposition: A | Discharge status: Home / Self Care | Payer: Medicaid Other | Attending: Emergency Medicine | Admitting: Emergency Medicine

## 2017-12-14 DIAGNOSIS — Z79899 Other long term (current) drug therapy: Secondary | ICD-10-CM | POA: Insufficient documentation

## 2017-12-14 DIAGNOSIS — R040 Epistaxis: Secondary | ICD-10-CM | POA: Insufficient documentation

## 2017-12-14 DIAGNOSIS — F17210 Nicotine dependence, cigarettes, uncomplicated: Secondary | ICD-10-CM | POA: Diagnosis not present

## 2017-12-14 NOTE — ED Notes (Signed)
ED Provider at bedside. 

## 2017-12-14 NOTE — ED Triage Notes (Signed)
Pt reports daily morning nosebleed from right nostril x 1 week. States bleeding stops quickly but when she blows her nose or spits she noticed blood clots.

## 2017-12-14 NOTE — Discharge Instructions (Signed)
Switch from flonase to saline nasal spray. Flonase can commonly cause nosebleeds. Use a humidifier to help prevent recurrence.  Follow up with your primary care provider if symptoms persist. Return to the ED if you have constant heavy nose bleed for 30 minutes or more that will not stop.

## 2017-12-14 NOTE — ED Provider Notes (Addendum)
[place] EMERGENCY DEPARTMENT Provider Note   CSN: [id number] Arrival date & time: 12/14/17  1348     History   Chief Complaint Chief Complaint  Patient presents with  . Epistaxis    HPI Cheryl King is a 52 y.o. female w PMHx seasonal allergies, GERD, HLD, presenting to the ED with complaint of intermittent right sided nosebleeds x 1 week. Pt states in the mornings she coughs (normal for her) and she gets a slow nose bleed described as "drops" that lasts for a few minutes then resolves. Denies pain with this. States it has been coming for the last week during the mornings. States before this, she had a small scab in her nose that she kept picking, but decided to stop a couple of days ago when the nose bleeds became recurrent. She denies pain, injury, cocaine use. She is not on anticoagulation. Does use flonase daily for seasonal allergies. Reports she used to have issues with nosebleeds as a child, though has not had them is a long time.  The history is provided by the patient.    Past Medical History:  Diagnosis Date  . Acid reflux   . Allergy   . Carpal tunnel syndrome   . Depression   . Diverticulitis   . Hyperlipidemia   . Low back pain   . Neck pain   . 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 Sciatica   . Seasonal allergies     Patient Active Problem List   Diagnosis Date Noted  . Hyperlipidemia 07/18/2017  . Ulnar neuropathy 04/18/2017  . GERD (gastroesophageal reflux disease) 10/19/2016  . Prediabetes 05/03/2016  . Cubital tunnel syndrome on right 03/01/2016  . Numbness and tingling of right arm 01/01/2016  . Chlamydia infection 01/27/2015  . Smoking 12/26/2013  . Chronic lower back pain 04/13/2013  . Sciatica 04/13/2013  . Chronic neck pain 04/13/2013  . Spondylosis of cervical spine 04/13/2013    Past Surgical History:  Procedure Laterality Date  . ABDOMINAL SURGERY    . ANKLE SURGERY     lt.  Marland Kitchen PARTIAL HYSTERECTOMY    . right breast  cyst removed     . URETHRAL DIVERTICULUM REPAIR     uretrral diverticultis     OB History    Gravida  1   Para      Term      Preterm      AB  1   Living        SAB  1   TAB      Ectopic      Multiple      Live Births               Home Medications    Prior to Admission medications   Medication Sig Start Date End Date Taking? Authorizing Provider  acetaminophen-codeine (TYLENOL #3) 300-30 MG tablet TAKE 1 TABLET BY MOUTH EVERY 8 HOURS AS NEEDED FOR MODERATE PAIN. 08/23/17   Newt Minion, MD  atorvastatin (LIPITOR) 20 MG tablet Take 1 tablet (20 mg total) by mouth daily. 10/18/17   Charlott Rakes, MD  butalbital-acetaminophen-caffeine (FIORICET, ESGIC) 50-325-40 MG tablet Take 1 tablet by mouth every 12 (twelve) hours as needed for headache. 04/18/17 04/18/18  Charlott Rakes, MD  cetirizine (ZYRTEC) 10 MG tablet Take 1 tablet (10 mg total) by mouth daily. 04/18/17   Charlott Rakes, MD  cyclobenzaprine (FLEXERIL) 10 MG tablet TAKE 1 TABLET BY MOUTH 2 (TWO) TIMES DAILY AS NEEDED FOR  MUSCLE SPASMS. 10/18/17   Charlott Rakes, MD  diclofenac sodium (VOLTAREN) 1 % GEL Apply 2 g topically 4 (four) times daily. 03/18/17   Jessy Oto, MD  DULoxetine (CYMBALTA) 60 MG capsule Take 1 capsule (60 mg total) by mouth daily. 10/18/17   Charlott Rakes, MD  fluticasone (FLONASE) 50 MCG/ACT nasal spray PLACE 2 SPRAYS INTO BOTH NOSTRILS DAILY. 10/05/17   Charlott Rakes, MD  Lactobacillus CAPS Take 1 capsule by mouth daily.    [provider]  Multiple Vitamin (MULTIVITAMIN WITH MINERALS) TABS tablet Take 1 tablet by mouth daily.    [provider]  naproxen (NAPROSYN) 500 MG tablet TAKE 1 TABLET BY MOUTH 2 TIMES DAILY WITH A MEAL. 09/26/17   Jessy Oto, MD  nicotine (NICODERM CQ) 14 mg/24hr patch Place 1 patch (14 mg total) onto the skin daily. For 4 weeks then 7mg /24hr daily for  4 weeks 07/18/17   Charlott Rakes, MD  omeprazole (PRILOSEC) 20 MG capsule Take 1 capsule  (20 mg total) by mouth daily. 10/18/17   Newlin, Charlane Ferretti, MD  ondansetron (ZOFRAN) 4 MG tablet TAKE 1 TABLET BY MOUTH EVERY 8 HOURS AS NEEDED FOR NAUSEA OR VOMITING. 10/19/16   Charlott Rakes, MD  predniSONE (STERAPRED UNI-PAK 21 TAB) 10 MG (21) TBPK tablet Take by mouth daily. Take 6 tabs by mouth daily for 2 days, then 5 tabs for 2 days, then 4 tabs for 2 days, then 3 tabs for 2 days, 2 tabs for 2 days, then 1 tab by mouth daily for 2 days 10/04/17   Wieters, Hallie C, PA-C  pregabalin (LYRICA) 50 MG capsule Take 1 capsule (50 mg total) by mouth at bedtime. 07/18/17   Charlott Rakes, MD  RESTASIS 0.05 % ophthalmic emulsion  04/13/17   [provider]  traMADol (ULTRAM) 50 MG tablet Take by mouth every 6 (six) hours as needed.    [provider]  triamcinolone cream (KENALOG) 0.1 % Apply 1 application topically 2 (two) times daily. 07/18/17   Charlott Rakes, MD    Family History Family History  Problem Relation Age of Onset  . Healthy Mother   . Other Father        Unsure of medical history  . Asthma Maternal Aunt   . Diabetes Maternal Aunt   . Cancer Maternal Aunt   . Breast cancer Maternal Aunt   . Hypertension Maternal Grandmother   . Colon cancer Neg Hx   . Pancreatic cancer Neg Hx   . Rectal cancer Neg Hx   . Stomach cancer Neg Hx   . Colon polyps Neg Hx   . Esophageal cancer Neg Hx     Social History Social History   Tobacco Use  . Smoking status: Current Every Day Smoker    Packs/day: 0.50    Years: 35.00    Pack years: 17.50    Types: Cigarettes  . Smokeless tobacco: Never Used  Substance Use Topics  . Alcohol use: Yes    Comment: Social only - seldom  . Drug use: No     Allergies   Percocet [oxycodone-acetaminophen]; Vicodin [hydrocodone-acetaminophen]; and Penicillins   Review of Systems Review of Systems  HENT: Positive for nosebleeds.   Hematological: Does not bruise/bleed easily.  All other systems reviewed and are  negative.    Physical Exam Updated Vital Signs BP 116/72 (BP Location: Left Arm)   Pulse 78   Temp 98.4 F (36.9 C) (Oral)   Resp 16   Ht 5'  7" (1.702 m)   Wt 63.5 kg   SpO2 96%   BMI 21.93 kg/m   Physical Exam  Constitutional: She appears well-developed and well-nourished. No distress.  HENT:  Head: Normocephalic and atraumatic.  Nose: No septal deviation or nasal septal hematoma. No epistaxis.  Mouth/Throat: Uvula is midline and oropharynx is clear and moist.  Small nonbleeding scab to the lower right anterior septum. No petechiae to the septum. Nares are patent.  Eyes: Conjunctivae are normal.  Cardiovascular: Normal rate.  Pulmonary/Chest: Effort normal.  Abdominal: Soft.  Lymphadenopathy:    She has no cervical adenopathy.  Neurological: She is alert.  Skin: Skin is warm. No rash noted.  Psychiatric: She has a normal mood and affect. Her behavior is normal.  Nursing note and vitals reviewed.    ED Treatments / Results  Labs (all labs ordered are listed, but only abnormal results are displayed) Labs Reviewed - No data to display  EKG None  Radiology No results found.  Procedures Procedures (including critical care time)  Medications Ordered in ED Medications - No data to display   Initial Impression / Assessment and Plan / ED Course  I have reviewed the triage vital signs and the nursing notes.  Pertinent labs & imaging results that were available during my care of the patient were reviewed by me and considered in my medical decision making (see chart for details).     Patient presenting for intermittent nosebleeds in the right nostril for about 1 week.  Patient has a small scab to the anterior lower right septum that is nonbleeding, which she states she was picking for a few days.  She also uses Flonase daily for seasonal allergies.  On exam, blood pressure is normal.  There is no petechia to the septum or other rashes to suggest a hematologic cause.   Nose is not currently bleeding on exam and nares are patent.  Remainder of ENT exam is unremarkable. Pt is not on anticoagulation. Discussed symptomatic management including humidified air, discontinue the use of Flonase and switch to saline, avoid nose picking.  PCP follow-up recommended if symptoms persist.  Return precautions discussed.  Agreeable to plan and safe for discharge.  Discussed results, findings, treatment and follow up. Patient advised of return precautions. Patient verbalized understanding and agreed with plan.   Final Clinical Impressions(s) / ED Diagnoses   Final diagnoses:  Right-sided epistaxis    ED Discharge Orders    None       Xzander Gilham, [place] N, PA-C 12/14/17 1428    Kino Dunsworth, [place] N, [place] 12/14/17 1429    Davonna Belling, MD 12/14/17 1434

## 2017-12-19 MED FILL — NAPROXEN 500 MG TABLET: 500 | 30 days supply | Qty: 60 | Fill #2

## 2017-12-22 ENCOUNTER — Encounter: Payer: Self-pay | Admitting: Physical Therapy

## 2017-12-22 ENCOUNTER — Ambulatory Visit: Payer: Medicaid Other | Attending: Family Medicine | Admitting: Physical Therapy

## 2017-12-22 DIAGNOSIS — M6281 Muscle weakness (generalized): Secondary | ICD-10-CM

## 2017-12-22 DIAGNOSIS — G8929 Other chronic pain: Secondary | ICD-10-CM | POA: Diagnosis not present

## 2017-12-22 DIAGNOSIS — M5442 Lumbago with sciatica, left side: Secondary | ICD-10-CM | POA: Insufficient documentation

## 2017-12-22 DIAGNOSIS — M5441 Lumbago with sciatica, right side: Secondary | ICD-10-CM | POA: Diagnosis not present

## 2017-12-22 NOTE — Therapy (Signed)
[place], [place], [address] Phone: [phone]   Fax:  [phone]  Physical Therapy Treatment  Patient Details  Name: Cheryl King MRN: [id number] Date of Birth: August 03, 1965 Referring Provider (PT): Charlott Rakes, MD   Encounter Date: 12/22/2017  PT End of Session - 12/22/17 1328    Visit Number  2    Authorization Type  MCD 3 visits 11/25-12/15    Authorization - Visit Number  1    Authorization - Number of Visits  3    PT Start Time  1330    PT Stop Time  1412    PT Time Calculation (min)  42 min    Activity Tolerance  Patient tolerated treatment well    Behavior During Therapy  [facility] for tasks assessed/performed       Past Medical History:  Diagnosis Date  . Acid reflux   . Allergy   . Carpal tunnel syndrome   . Depression   . Diverticulitis   . Hyperlipidemia   . Low back pain   . Neck pain   . 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 Sciatica   . Seasonal allergies     Past Surgical History:  Procedure Laterality Date  . ABDOMINAL SURGERY    . ANKLE SURGERY     lt.  Marland Kitchen PARTIAL HYSTERECTOMY    . right breast cyst removed     . URETHRAL DIVERTICULUM REPAIR     uretrral diverticultis    There were no vitals filed for this visit.  Subjective Assessment - 12/22/17 1329    Subjective  Aching, bilateral buttock pain    Patient Stated Goals  decrease pain, vacuum, yard work    Currently in Pain?  Yes    Pain Score  8     Pain Location  Back    Pain Orientation  Lower    Pain Descriptors / Indicators  Sore;Aching                       [facility] Adult PT Treatment/Exercise - 12/22/17 0001      Exercises   Exercises  Lumbar      Lumbar Exercises: Stretches   Lower Trunk Rotation  5 reps    Lower Trunk Rotation Limitations  each    Other Lumbar Stretch Exercise  DKTC      Lumbar Exercises: Aerobic   Nustep  5 min L4 LE only      Lumbar Exercises: Standing    Other Standing Lumbar Exercises  seated & standing ab set      Lumbar Exercises: Supine   Pelvic Tilt Limitations  ball squeeze + PPT    Bridge with Ball Squeeze  10 reps    Other Supine Lumbar Exercises  cat/camel/child pose      Manual Therapy   Manual Therapy  Soft tissue mobilization;Muscle Energy Technique    Manual therapy comments  skilled palpation and monitoring during TPDN    Soft tissue mobilization  Rt glut med/min, piriformis    Muscle Energy Technique  Rt hip flexors/Lt extensors       Trigger Point Dry Needling - 12/22/17 1409    Consent Given?  Yes    Education Handout Provided  No   verbal educaiton   Muscles Treated Lower Body  Gluteus minimus;Gluteus maximus;Piriformis   Right   Gluteus Maximus Response  Twitch response elicited;Palpable increased muscle length    Gluteus Minimus Response  Twitch response  elicited;Palpable increased muscle length    Piriformis Response  Twitch response elicited;Palpable increased muscle length             PT Short Term Goals - 12/01/17 0849      PT SHORT TERM GOAL #1   Title  Pt will be able to demonstrate proper core contraction in supine, seated and standing postures    Baseline  began educating at eval    Time  5    Period  Weeks    Status  New    Target Date  01/02/18      PT SHORT TERM GOAL #2   Title  Pt will be independent in short term HEP as it has been established at target date    Baseline  began establishing at eval    Time  5    Period  Weeks    Status  New    Target Date  01/02/18        PT Long Term Goals - 12/01/17 0853      PT LONG TERM GOAL #1   Title  Pt will be indpendent in long term HEP for continued care    Baseline  will progress and establish as appropriate    Time  10   time to accommodate for MCD auth   Period  Weeks    Status  New    Target Date  02/10/18      PT LONG TERM GOAL #2   Title  gross hip strength 5/5    Baseline  see flowsheet    Time  10    Period  Weeks     Status  New    Target Date  02/10/18      PT LONG TERM GOAL #3   Title  pt will demo lumbar AROM equal side to side without increased pain    Baseline  see flowsheet    Time  10    Period  Weeks    Status  New    Target Date  02/10/18      PT LONG TERM GOAL #4   Title  Pt will be able to tolerate a seated position for at least 30 min     Baseline  <10 min at eval    Time  10    Period  Weeks    Status  New    Target Date  02/10/18      PT LONG TERM GOAL #5   Title  Pt will be able to stand/walk for at least 1 hour    Baseline  unable at eval    Time  10    Period  Weeks    Status  New    Target Date  02/10/18            Plan - 12/22/17 1409    Clinical Impression Statement  MET utilized to correct pelvic rotation and DN to decrease trigger points. Reported feeling the Left side more after treatment of Rt. Progressed HEP to further engage core. Multiple cues required for proper form with pelvic tilt.     PT Treatment/Interventions  ADLs/Self Care Home Management;Cryotherapy;Electrical Stimulation;Ultrasound;Traction;Moist Heat;Therapeutic activities;Therapeutic exercise;Manual techniques;Patient/family education;Passive range of motion;Dry needling;Taping    PT Next Visit Plan  continue core strengthening, manual therapy/TPDN PRN, glut strengthening    PT Home Exercise Plan  ball squeeze with PPT, LTR, figure 4 --> bridge with ball, LTR, cat/camel/child pose, crunch with hundreds    Consulted and  Agree with Plan of Care  Patient       Patient will benefit from skilled therapeutic intervention in order to improve the following deficits and impairments:  Difficulty walking, Increased muscle spasms, Decreased activity tolerance, Hypermobility, Pain, Improper body mechanics, Postural dysfunction, Decreased strength  Visit Diagnosis: Chronic bilateral low back pain with bilateral sciatica  Muscle weakness (generalized)     Problem List Patient Active Problem List    Diagnosis Date Noted  . Hyperlipidemia 07/18/2017  . Ulnar neuropathy 04/18/2017  . GERD (gastroesophageal reflux disease) 10/19/2016  . Prediabetes 05/03/2016  . Cubital tunnel syndrome on right 03/01/2016  . Numbness and tingling of right arm 01/01/2016  . Chlamydia infection 01/27/2015  . Smoking 12/26/2013  . Chronic lower back pain 04/13/2013  . Sciatica 04/13/2013  . Chronic neck pain 04/13/2013  . Spondylosis of cervical spine 04/13/2013    Butch Otterson C. Medhansh Brinkmeier PT, DPT 12/22/17 2:33 PM   [facility] [address] [place], [place], [address] Phone: [phone]   Fax:  [phone]  Name: Cheryl King MRN: [id number] Date of Birth: 06-13-1965

## 2017-12-28 ENCOUNTER — Encounter: Payer: Self-pay | Admitting: Physical Therapy

## 2017-12-28 ENCOUNTER — Ambulatory Visit: Payer: Medicaid Other | Admitting: Physical Therapy

## 2017-12-28 DIAGNOSIS — G8929 Other chronic pain: Secondary | ICD-10-CM

## 2017-12-28 DIAGNOSIS — M6281 Muscle weakness (generalized): Secondary | ICD-10-CM

## 2017-12-28 DIAGNOSIS — M5442 Lumbago with sciatica, left side: Secondary | ICD-10-CM | POA: Diagnosis not present

## 2017-12-28 DIAGNOSIS — M5441 Lumbago with sciatica, right side: Secondary | ICD-10-CM | POA: Diagnosis not present

## 2017-12-28 NOTE — Therapy (Signed)
[place], [place], [address] Phone: [phone]   Fax:  [phone]  Physical Therapy Treatment  Patient Details  Name: Cheryl King MRN: [id number] Date of Birth: 05-May-1965 Referring Provider (PT): Charlott Rakes, MD   Encounter Date: 12/28/2017  PT End of Session - 12/28/17 0800    Visit Number  3    Authorization Type  MCD 3 visits 11/25-12/15    PT Start Time  0801    PT Stop Time  0846    PT Time Calculation (min)  45 min       Past Medical History:  Diagnosis Date  . Acid reflux   . Allergy   . Carpal tunnel syndrome   . Depression   . Diverticulitis   . Hyperlipidemia   . Low back pain   . Neck pain   . 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 Sciatica   . Seasonal allergies     Past Surgical History:  Procedure Laterality Date  . ABDOMINAL SURGERY    . ANKLE SURGERY     lt.  Marland Kitchen PARTIAL HYSTERECTOMY    . right breast cyst removed     . URETHRAL DIVERTICULUM REPAIR     uretrral diverticultis    There were no vitals filed for this visit.  Subjective Assessment - 12/28/17 0801    Subjective  Pt reports she only did cat/cow because she she was in to much pain, she does feel like the DN helped some    Patient Stated Goals  decrease pain, vacuum, yard work    Currently in Pain?  Yes    Pain Score  8     Pain Location  Back    Pain Orientation  Lower    Pain Descriptors / Indicators  Aching;Sore    Pain Type  Chronic pain    Pain Onset  More than a month ago    Pain Frequency  Constant    Aggravating Factors   prolonged activity    Pain Relieving Factors  nothing lately, lying down caused her sides to hurt.          [facility] PT Assessment - 12/28/17 0001      Assessment   Medical Diagnosis  LBP with bil sciatica    Referring Provider (PT)  Charlott Rakes, MD                   [facility] Adult PT Treatment/Exercise - 12/28/17 0001      Self-Care   Self-Care   Other Self-Care Comments    Other Self-Care Comments   self TPR using tennis ball against wall for low back and bilat gluts      Exercises   Exercises  Lumbar      Lumbar Exercises: Stretches   Single Knee to Chest Stretch  Left;Right;30 seconds;2 reps    Double Knee to Chest Stretch  60 seconds      Lumbar Exercises: Aerobic   Nustep  5 min L4 LE only      Modalities   Modalities  Moist Heat;Electrical Stimulation      Moist Heat Therapy   Number Minutes Moist Heat  15 Minutes    Moist Heat Location  Lumbar Spine      Electrical Stimulation   Electrical Stimulation Location  lumbar    Electrical Stimulation Action  IFC     Electrical Stimulation Parameters  to tolerance    Electrical Stimulation Goals  Pain;Tone  Manual Therapy   Manual Therapy  Soft tissue mobilization;Muscle Energy Technique    Manual therapy comments  skilled palpation and monitoring during TPDN    Soft tissue mobilization  STM bilat gluts and lumbar paraspinals       Trigger Point Dry Needling - 12/28/17 0823    Muscles Treated Upper Body  Longissimus    Longissimus Response  Palpable increased muscle length;Twitch response elicited   bilat X0-9   Gluteus Maximus Response  Twitch response elicited;Palpable increased muscle length   bilat   Gluteus Minimus Response  Palpable increased muscle length;Twitch response elicited   bilat            PT Short Term Goals - 12/01/17 0849      PT SHORT TERM GOAL #1   Title  Pt will be able to demonstrate proper core contraction in supine, seated and standing postures    Baseline  began educating at eval    Time  5    Period  Weeks    Status  New    Target Date  01/02/18      PT SHORT TERM GOAL #2   Title  Pt will be independent in short term HEP as it has been established at target date    Baseline  began establishing at eval    Time  5    Period  Weeks    Status  New    Target Date  01/02/18        PT Long Term Goals - 12/01/17 0853       PT LONG TERM GOAL #1   Title  Pt will be indpendent in long term HEP for continued care    Baseline  will progress and establish as appropriate    Time  10   time to accommodate for MCD auth   Period  Weeks    Status  New    Target Date  02/10/18      PT LONG TERM GOAL #2   Title  gross hip strength 5/5    Baseline  see flowsheet    Time  10    Period  Weeks    Status  New    Target Date  02/10/18      PT LONG TERM GOAL #3   Title  pt will demo lumbar AROM equal side to side without increased pain    Baseline  see flowsheet    Time  10    Period  Weeks    Status  New    Target Date  02/10/18      PT LONG TERM GOAL #4   Title  Pt will be able to tolerate a seated position for at least 30 min     Baseline  <10 min at eval    Time  10    Period  Weeks    Status  New    Target Date  02/10/18      PT LONG TERM GOAL #5   Title  Pt will be able to stand/walk for at least 1 hour    Baseline  unable at eval    Time  10    Period  Weeks    Status  New    Target Date  02/10/18            Plan - 12/28/17 0808    Clinical Impression Statement  Ma presented with reports of high LBP, she was in alignment today.  Lots of  muscular tightness in her low back and bilat gluts.  this settled down after manual work and DN with patient reports of decreased pain at end of tx.  She was encouraged to perform all of her HEP even when she is hurting at home as they should help.     Rehab Potential  Good    PT Treatment/Interventions  ADLs/Self Care Home Management;Cryotherapy;Electrical Stimulation;Ultrasound;Traction;Moist Heat;Therapeutic activities;Therapeutic exercise;Manual techniques;Patient/family education;Passive range of motion;Dry needling;Taping    PT Next Visit Plan  write renewal and request more visits    Consulted and Agree with Plan of Care  Patient       Patient will benefit from skilled therapeutic intervention in order to improve the following deficits and  impairments:  Difficulty walking, Increased muscle spasms, Decreased activity tolerance, Hypermobility, Pain, Improper body mechanics, Postural dysfunction, Decreased strength  Visit Diagnosis: Chronic bilateral low back pain with bilateral sciatica  Muscle weakness (generalized)     Problem List Patient Active Problem List   Diagnosis Date Noted  . Hyperlipidemia 07/18/2017  . Ulnar neuropathy 04/18/2017  . GERD (gastroesophageal reflux disease) 10/19/2016  . Prediabetes 05/03/2016  . Cubital tunnel syndrome on right 03/01/2016  . Numbness and tingling of right arm 01/01/2016  . Chlamydia infection 01/27/2015  . Smoking 12/26/2013  . Chronic lower back pain 04/13/2013  . Sciatica 04/13/2013  . Chronic neck pain 04/13/2013  . Spondylosis of cervical spine 04/13/2013    Jeral Pinch PT  12/28/2017, 8:37 AM  [place], [place], [address] Phone: [phone]   Fax:  [phone]  Name: Cheryl King MRN: [id number] Date of Birth: September 27, 1965

## 2017-12-29 ENCOUNTER — Encounter: Payer: Self-pay | Admitting: Physical Therapy

## 2017-12-29 ENCOUNTER — Ambulatory Visit: Payer: Medicaid Other | Admitting: Physical Therapy

## 2017-12-29 DIAGNOSIS — G8929 Other chronic pain: Secondary | ICD-10-CM

## 2017-12-29 DIAGNOSIS — M5441 Lumbago with sciatica, right side: Principal | ICD-10-CM

## 2017-12-29 DIAGNOSIS — M5442 Lumbago with sciatica, left side: Secondary | ICD-10-CM | POA: Diagnosis not present

## 2017-12-29 DIAGNOSIS — M6281 Muscle weakness (generalized): Secondary | ICD-10-CM

## 2017-12-29 DIAGNOSIS — Z7689 Persons encountering health services in other specified circumstances: Secondary | ICD-10-CM | POA: Diagnosis not present

## 2017-12-29 NOTE — Therapy (Signed)
[place], [place], [address] Phone: [phone]   Fax:  [phone]  Physical Therapy Treatment  Patient Details  Name: Cheryl King MRN: [id number] Date of Birth: [birth year] Referring Provider (PT): Charlott Rakes, MD   Encounter Date: 12/29/2017  PT End of Session - 12/29/17 1017    Visit Number  4    Authorization Type  MCD 3 visits 11/25-12/15, waiting for new auth    Authorization - Visit Number  3    Authorization - Number of Visits  3    PT Start Time  1017    PT Stop Time  1114    PT Time Calculation (min)  57 min    Activity Tolerance  Patient tolerated treatment well    Behavior During Therapy  [facility] for tasks assessed/performed       Past Medical History:  Diagnosis Date  . Acid reflux   . Allergy   . Carpal tunnel syndrome   . Depression   . Diverticulitis   . Hyperlipidemia   . Low back pain   . Neck pain   . 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 Sciatica   . Seasonal allergies     Past Surgical History:  Procedure Laterality Date  . ABDOMINAL SURGERY    . ANKLE SURGERY     lt.  Marland Kitchen PARTIAL HYSTERECTOMY    . right breast cyst removed     . URETHRAL DIVERTICULUM REPAIR     uretrral diverticultis    There were no vitals filed for this visit.  Subjective Assessment - 12/29/17 1022    Subjective  I think I will always be in pain. Dry needling seems to be helpful. Bursitis is still limiting    How long can you sit comfortably?  10 min    How long can you walk comfortably?  about 20 minutes    Patient Stated Goals  decrease pain, vacuum, yard work    Currently in Pain?  Yes    Pain Score  0-No pain    Pain Location  Back    Pain Orientation  Lower;Right;Left    Pain Descriptors / Indicators  Sore    Aggravating Factors   showering, cleaning floors    Pain Relieving Factors  DN, TENS         [facility] PT Assessment - 12/29/17 0001      Assessment   Medical Diagnosis   LBP with bil sciatica    Referring Provider (PT)  Charlott Rakes, MD    Onset Date/Surgical Date  01/19/07   approx     Sensation   Additional Comments  radicular symptoms stopping in buttock bilaterally, no more into LE      Posture/Postural Control   Posture Comments  Rt GHJ depression with bil winging scapula, bil knee recurvatum      AROM   Lumbar - Right Side Bend  75% with pain in Rt QL    Lumbar - Left Side Bend  to knee joint      PROM   PROM Assessment Site  Hip    Right/Left Hip  Right;Left    Right Hip Internal Rotation   47   pain in SIJ   Left Hip Internal Rotation   50      Strength   Right Hip Flexion  4/5    Right Hip Extension  4-/5    Right Hip ABduction  4/5    Left Hip Flexion  4/5  Left Hip Extension  4-/5    Left Hip ABduction  4/5      Palpation   Palpation comment  Lt SIJ TTP                   [facility] Adult PT Treatment/Exercise - 12/29/17 0001      Lumbar Exercises: Stretches   Double Knee to Chest Stretch Limitations  x3 3 deep breaths each    Other Lumbar Stretch Exercise  QL seated and supine      Lumbar Exercises: Seated   Other Seated Lumbar Exercises  physioball press with core contraction      Moist Heat Therapy   Number Minutes Moist Heat  15 Minutes   with TENS   Moist Heat Location  Lumbar Spine      Electrical Stimulation   Electrical Stimulation Location  lumbar    Electrical Stimulation Action  IFC    Electrical Stimulation Parameters  to tolerance with heat    Electrical Stimulation Goals  Pain             PT Education - 12/29/17 1151    Education Details  POC, HEP, goals discussion, rationale and use of DN, home TENS    Person(s) Educated  Patient    Methods  Explanation;Demonstration;Tactile cues;Verbal cues    Comprehension  Verbalized understanding;Returned demonstration;Verbal cues required;Tactile cues required;Need further instruction       PT Short Term Goals - 12/29/17 1047      PT  SHORT TERM GOAL #1   Title  Pt will be able to demonstrate proper core contraction in supine, seated and standing postures    Status  Achieved      PT SHORT TERM GOAL #2   Title  Pt will be independent in short term HEP as it has been established at target date    Status  Achieved        PT Long Term Goals - 12/01/17 0853      PT LONG TERM GOAL #1   Title  Pt will be indpendent in long term HEP for continued care    Baseline  will progress and establish as appropriate    Time  10   time to accommodate for MCD auth   Period  Weeks    Status  New    Target Date  02/10/18      PT LONG TERM GOAL #2   Title  gross hip strength 5/5    Baseline  see flowsheet    Time  10    Period  Weeks    Status  New    Target Date  02/10/18      PT LONG TERM GOAL #3   Title  pt will demo lumbar AROM equal side to side without increased pain    Baseline  see flowsheet    Time  10    Period  Weeks    Status  New    Target Date  02/10/18      PT LONG TERM GOAL #4   Title  Pt will be able to tolerate a seated position for at least 30 min     Baseline  <10 min at eval    Time  10    Period  Weeks    Status  New    Target Date  02/10/18      PT LONG TERM GOAL #5   Title  Pt will be able to stand/walk for at least  1 hour    Baseline  unable at eval    Time  10    Period  Weeks    Status  New    Target Date  02/10/18            Plan - 12/29/17 1113    Clinical Impression Statement  Pt has met her short term goals at this time and demonstrates improvements in postural alignment and gross strength. Continues to have functional endurance limitations as well as would benefit from further strengthening and stabilization. Pt will continue to benefit from skilled     PT Treatment/Interventions  ADLs/Self Care Home Management;Cryotherapy;Electrical Stimulation;Ultrasound;Traction;Moist Heat;Therapeutic activities;Therapeutic exercise;Manual techniques;Patient/family education;Passive range  of motion;Dry needling;Taping    PT Home Exercise Plan  ball squeeze with PPT, LTR, figure 4 --> bridge with ball, LTR, cat/camel/child pose, crunch with hundreds, QL stretch    Consulted and Agree with Plan of Care  Patient       Patient will benefit from skilled therapeutic intervention in order to improve the following deficits and impairments:  Difficulty walking, Increased muscle spasms, Decreased activity tolerance, Hypermobility, Pain, Improper body mechanics, Postural dysfunction, Decreased strength  Visit Diagnosis: Chronic bilateral low back pain with bilateral sciatica  Muscle weakness (generalized)     Problem List Patient Active Problem List   Diagnosis Date Noted  . Hyperlipidemia 07/18/2017  . Ulnar neuropathy 04/18/2017  . GERD (gastroesophageal reflux disease) 10/19/2016  . Prediabetes 05/03/2016  . Cubital tunnel syndrome on right 03/01/2016  . Numbness and tingling of right arm 01/01/2016  . Chlamydia infection 01/27/2015  . Smoking 12/26/2013  . Chronic lower back pain 04/13/2013  . Sciatica 04/13/2013  . Chronic neck pain 04/13/2013  . Spondylosis of cervical spine 04/13/2013    Ledora Delker C. Lakin Rhine PT, DPT 12/29/17 11:58 AM   [facility] [address] [place], [place], [address] Phone: [phone]   Fax:  [phone]  Name: Cheryl King MRN: [id number] Date of Birth: 01-24-1965

## 2018-01-02 MED FILL — CYCLOBENZAPRINE 10 MG TAB: 10 | 30 days supply | Qty: 60 | Fill #1

## 2018-01-02 MED FILL — OMEPRAZOLE 20 MG CAP: 20 | 30 days supply | Qty: 30 | Fill #5

## 2018-01-05 DIAGNOSIS — H16223 Keratoconjunctivitis sicca, not specified as Sjogren's, bilateral: Secondary | ICD-10-CM | POA: Diagnosis not present

## 2018-01-05 DIAGNOSIS — Z7689 Persons encountering health services in other specified circumstances: Secondary | ICD-10-CM | POA: Diagnosis not present

## 2018-01-05 DIAGNOSIS — H40033 Anatomical narrow angle, bilateral: Secondary | ICD-10-CM | POA: Diagnosis not present

## 2018-01-19 ENCOUNTER — Ambulatory Visit: Payer: Medicaid Other | Attending: Family Medicine | Admitting: Physical Therapy

## 2018-01-19 ENCOUNTER — Encounter: Payer: Self-pay | Admitting: Physical Therapy

## 2018-01-19 DIAGNOSIS — M5442 Lumbago with sciatica, left side: Secondary | ICD-10-CM | POA: Insufficient documentation

## 2018-01-19 DIAGNOSIS — G8929 Other chronic pain: Secondary | ICD-10-CM | POA: Insufficient documentation

## 2018-01-19 DIAGNOSIS — M6281 Muscle weakness (generalized): Secondary | ICD-10-CM | POA: Insufficient documentation

## 2018-01-19 DIAGNOSIS — M5441 Lumbago with sciatica, right side: Secondary | ICD-10-CM | POA: Diagnosis present

## 2018-01-19 DIAGNOSIS — Z7689 Persons encountering health services in other specified circumstances: Secondary | ICD-10-CM | POA: Diagnosis not present

## 2018-01-19 NOTE — Therapy (Signed)
[place], [place], [address] Phone: [phone]   Fax:  [phone]  Physical Therapy Treatment  Patient Details  Name: Cheryl King MRN: [id number] Date of Birth: 01/05/1966 Referring Provider (PT): Charlott Rakes, MD   Encounter Date: 01/19/2018  PT End of Session - 01/19/18 1023    Visit Number  5    PT Start Time  1022   pt in restroom   PT Stop Time  1100    PT Time Calculation (min)  38 min    Activity Tolerance  Patient tolerated treatment well    Behavior During Therapy  [facility] for tasks assessed/performed       Past Medical History:  Diagnosis Date  . Acid reflux   . Allergy   . Carpal tunnel syndrome   . Depression   . Diverticulitis   . Hyperlipidemia   . Low back pain   . Neck pain   . 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 Sciatica   . Seasonal allergies     Past Surgical History:  Procedure Laterality Date  . ABDOMINAL SURGERY    . ANKLE SURGERY     lt.  Marland Kitchen PARTIAL HYSTERECTOMY    . right breast cyst removed     . URETHRAL DIVERTICULUM REPAIR     uretrral diverticultis    There were no vitals filed for this visit.  Subjective Assessment - 01/19/18 1024    Subjective  Bursitis is most limiting. Last night I finally was getting some good sleep.     Patient Stated Goals  decrease pain, vacuum, yard work    Currently in Pain?  Yes    Pain Score  8     Pain Location  Buttocks    Pain Orientation  Right;Left;Lateral    Pain Descriptors / Indicators  Sore    Aggravating Factors   laying in bed    Pain Relieving Factors  TENS, get out of bed                       [facility] Adult PT Treatment/Exercise - 01/19/18 0001      Lumbar Exercises: Stretches   Passive Hamstring Stretch Limitations  seated EOB & supine with strap    Figure 4 Stretch  30 seconds;Seated    Gastroc Stretch Limitations  x2 on slant board & x1 each at counter      Lumbar Exercises: Aerobic    Nustep  5 min L5 UE & LE      Lumbar Exercises: Machines for Strengthening   Leg Press  supine leg press x15 2 plates      Manual Therapy   Soft tissue mobilization  Roller bil hips & ITB               PT Short Term Goals - 12/29/17 1047      PT SHORT TERM GOAL #1   Title  Pt will be able to demonstrate proper core contraction in supine, seated and standing postures    Status  Achieved      PT SHORT TERM GOAL #2   Title  Pt will be independent in short term HEP as it has been established at target date    Status  Achieved        PT Long Term Goals - 12/01/17 0853      PT LONG TERM GOAL #1   Title  Pt will be indpendent in long term HEP  for continued care    Baseline  will progress and establish as appropriate    Time  10   time to accommodate for MCD auth   Period  Weeks    Status  New    Target Date  02/10/18      PT LONG TERM GOAL #2   Title  gross hip strength 5/5    Baseline  see flowsheet    Time  10    Period  Weeks    Status  New    Target Date  02/10/18      PT LONG TERM GOAL #3   Title  pt will demo lumbar AROM equal side to side without increased pain    Baseline  see flowsheet    Time  10    Period  Weeks    Status  New    Target Date  02/10/18      PT LONG TERM GOAL #4   Title  Pt will be able to tolerate a seated position for at least 30 min     Baseline  <10 min at eval    Time  10    Period  Weeks    Status  New    Target Date  02/10/18      PT LONG TERM GOAL #5   Title  Pt will be able to stand/walk for at least 1 hour    Baseline  unable at eval    Time  10    Period  Weeks    Status  New    Target Date  02/10/18            Plan - 01/19/18 1314    Clinical Impression Statement  Added more stretches for daily performance to address tightness at hips. reported feeling loosened up and able to move easier. reports doing a lot of sitting at home but will dry to stretch regularly.     PT Treatment/Interventions   ADLs/Self Care Home Management;Cryotherapy;Electrical Stimulation;Ultrasound;Traction;Moist Heat;Therapeutic activities;Therapeutic exercise;Manual techniques;Patient/family education;Passive range of motion;Dry needling;Taping    PT Next Visit Plan  continue with hip stretching with core strengthening    PT Home Exercise Plan  ball squeeze with PPT, LTR, figure 4 --> bridge with ball, LTR, cat/camel/child pose, crunch with hundreds, QL stretch; gastroc stretch, supine HSS with ITB    Consulted and Agree with Plan of Care  Patient       Patient will benefit from skilled therapeutic intervention in order to improve the following deficits and impairments:  Difficulty walking, Increased muscle spasms, Decreased activity tolerance, Hypermobility, Pain, Improper body mechanics, Postural dysfunction, Decreased strength  Visit Diagnosis: Chronic bilateral low back pain with bilateral sciatica  Muscle weakness (generalized)     Problem List Patient Active Problem List   Diagnosis Date Noted  . Hyperlipidemia 07/18/2017  . Ulnar neuropathy 04/18/2017  . GERD (gastroesophageal reflux disease) 10/19/2016  . Prediabetes 05/03/2016  . Cubital tunnel syndrome on right 03/01/2016  . Numbness and tingling of right arm 01/01/2016  . Chlamydia infection 01/27/2015  . Smoking 12/26/2013  . Chronic lower back pain 04/13/2013  . Sciatica 04/13/2013  . Chronic neck pain 04/13/2013  . Spondylosis of cervical spine 04/13/2013    [name]. Sherrin Stahle PT, DPT 01/19/18 1:16 PM   [facility] [address] [place], [place], [address] Phone: [phone]   Fax:  [phone]  Name: Cheryl King MRN: [id number] Date of Birth: 04-20-1965

## 2018-01-24 ENCOUNTER — Ambulatory Visit: Payer: Medicaid Other | Admitting: Physical Therapy

## 2018-01-24 ENCOUNTER — Encounter: Payer: Self-pay | Admitting: Physical Therapy

## 2018-01-24 DIAGNOSIS — M5442 Lumbago with sciatica, left side: Principal | ICD-10-CM

## 2018-01-24 DIAGNOSIS — Z7689 Persons encountering health services in other specified circumstances: Secondary | ICD-10-CM | POA: Diagnosis not present

## 2018-01-24 DIAGNOSIS — M5441 Lumbago with sciatica, right side: Principal | ICD-10-CM

## 2018-01-24 DIAGNOSIS — M6281 Muscle weakness (generalized): Secondary | ICD-10-CM

## 2018-01-24 DIAGNOSIS — G8929 Other chronic pain: Secondary | ICD-10-CM

## 2018-01-24 NOTE — Therapy (Addendum)
[place], [place], [address] Phone: [phone]   Fax:  [phone]  Physical Therapy Treatment  Patient Details  Name: Cheryl King MRN: [id number] Date of Birth: 1965/04/14 Referring Provider (PT): Charlott Rakes, MD   Encounter Date: 01/24/2018  PT End of Session - 01/24/18 0855    Visit Number  6    Authorization Type  MCD 3 visits 1/01- 1/14    Authorization - Visit Number  1    Authorization - Number of Visits  3    PT Start Time  0800    PT Stop Time  0901    PT Time Calculation (min)  61 min    Activity Tolerance  Patient tolerated treatment well       Past Medical History:  Diagnosis Date  . Acid reflux   . Allergy   . Carpal tunnel syndrome   . Depression   . Diverticulitis   . Hyperlipidemia   . Low back pain   . Neck pain   . 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 Sciatica   . Seasonal allergies     Past Surgical History:  Procedure Laterality Date  . ABDOMINAL SURGERY    . ANKLE SURGERY     lt.  Marland Kitchen PARTIAL HYSTERECTOMY    . right breast cyst removed     . URETHRAL DIVERTICULUM REPAIR     uretrral diverticultis    There were no vitals filed for this visit.  Subjective Assessment - 01/24/18 0803    Subjective  Pt states her back and hips are hurting and did not sleep well last night.     Currently in Pain?  Yes    Pain Score  8     Pain Location  Back   And bilat hips.   Pain Orientation  Right;Left;Lateral    Pain Descriptors / Indicators  Aching    Pain Type  Chronic pain    Aggravating Factors   laying in bed    Pain Relieving Factors  IFC                       [facility] Adult PT Treatment/Exercise - 01/24/18 0001      Lumbar Exercises: Stretches   Active Hamstring Stretch  3 reps;30 seconds    ITB Stretch  3 reps;30 seconds    Figure 4 Stretch  30 seconds;Seated      Lumbar Exercises: Aerobic   Nustep  5 min L5 UE & LE      Lumbar  Exercises: Machines for Strengthening   Leg Press  supine leg press 1 plate x25, 2 plates O96      Lumbar Exercises: Supine   Pelvic Tilt  10 reps    Pelvic Tilt Limitations  ball squeeze + PPT 10 reps   Cues for hip, knee, ankle alignment.   Bridge with [employer]  10 reps    Bridge with [employer] Limitations  Initial posterior pelvic tilt      Lumbar Exercises: Sidelying   Clam  Right;Left;15 reps      Lumbar Exercises: Quadruped   [place]/Old Horse  10 reps    Straight Leg Raise  10 reps    Straight Leg Raises Limitations  alternating, cues for alignment, abdominal draw in     Other Quadruped Lumbar Exercises  childs pose       Modalities   Modalities  Cryotherapy      Cryotherapy  Number Minutes Cryotherapy  15 Minutes    Cryotherapy Location  Hip   Right Hip   Type of Cryotherapy  Ice pack      Electrical Stimulation   Electrical Stimulation Location  Right hip    Electrical Stimulation Action  IFC     Electrical Stimulation Parameters  30 ma    Electrical Stimulation Goals  Pain      Manual Therapy   Soft tissue mobilization  Roller bil ITB               PT Short Term Goals - 12/29/17 1047      PT SHORT TERM GOAL #1   Title  Pt will be able to demonstrate proper core contraction in supine, seated and standing postures    Status  Achieved      PT SHORT TERM GOAL #2   Title  Pt will be independent in short term HEP as it has been established at target date    Status  Achieved        PT Long Term Goals - 12/01/17 0853      PT LONG TERM GOAL #1   Title  Pt will be indpendent in long term HEP for continued care    Baseline  will progress and establish as appropriate    Time  10   time to accommodate for MCD auth   Period  Weeks    Status  New    Target Date  02/10/18      PT LONG TERM GOAL #2   Title  gross hip strength 5/5    Baseline  see flowsheet    Time  10    Period  Weeks    Status  New    Target Date  02/10/18      PT LONG  TERM GOAL #3   Title  pt will demo lumbar AROM equal side to side without increased pain    Baseline  see flowsheet    Time  10    Period  Weeks    Status  New    Target Date  02/10/18      PT LONG TERM GOAL #4   Title  Pt will be able to tolerate a seated position for at least 30 min     Baseline  <10 min at eval    Time  10    Period  Weeks    Status  New    Target Date  02/10/18      PT LONG TERM GOAL #5   Title  Pt will be able to stand/walk for at least 1 hour    Baseline  unable at eval    Time  10    Period  Weeks    Status  New    Target Date  02/10/18            Plan - 01/24/18 0916    Clinical Impression Statement  Added clam shells in side lying to improve hip abduction strength. Patient reported feeling relief from bilateral pin rolling to each hip. Trial of IFC to Right hip.  Pt reported pain relief after estim but that she thinks left hip causes more pain than right hip at night. She would like to try IFC on left hip next visit.     PT Next Visit Plan  continue with hip stretching with core strengthening. Trial ESTIM on left hip next visit.    PT Home Exercise Plan  ball  squeeze with PPT, LTR, figure 4 --> bridge with ball, LTR, cat/camel/child pose, crunch with hundreds, QL stretch; gastroc stretch, supine HSS with ITB    Consulted and Agree with Plan of Care  Patient      During this treatment session, the therapist was present, participating in and directing the treatment.  Patient will benefit from skilled therapeutic intervention in order to improve the following deficits and impairments:  Difficulty walking, Increased muscle spasms, Decreased activity tolerance, Hypermobility, Pain, Improper body mechanics, Postural dysfunction, Decreased strength  Visit Diagnosis: No diagnosis found.     Problem List Patient Active Problem List   Diagnosis Date Noted  . Hyperlipidemia 07/18/2017  . Ulnar neuropathy 04/18/2017  . GERD (gastroesophageal reflux  disease) 10/19/2016  . Prediabetes 05/03/2016  . Cubital tunnel syndrome on right 03/01/2016  . Numbness and tingling of right arm 01/01/2016  . Chlamydia infection 01/27/2015  . Smoking 12/26/2013  . Chronic lower back pain 04/13/2013  . Sciatica 04/13/2013  . Chronic neck pain 04/13/2013  . Spondylosis of cervical spine 04/13/2013    Fuller Mandril, SPTA 01/24/2018, 9:28 AM   Hessie Diener, PTA 01/24/18 9:38 AM Phone: [phone] Fax: [facility] [address] [place], [place], [address] Phone: [phone]   Fax:  [phone]  Name: Cheryl King MRN: [id number] Date of Birth: 30-Jul-1965

## 2018-01-25 ENCOUNTER — Other Ambulatory Visit: Payer: Self-pay | Admitting: Family Medicine

## 2018-01-25 DIAGNOSIS — R0982 Postnasal drip: Secondary | ICD-10-CM

## 2018-01-26 ENCOUNTER — Encounter: Payer: Self-pay | Admitting: Physical Therapy

## 2018-01-26 ENCOUNTER — Ambulatory Visit: Payer: Medicaid Other | Admitting: Physical Therapy

## 2018-01-26 DIAGNOSIS — M6281 Muscle weakness (generalized): Secondary | ICD-10-CM

## 2018-01-26 DIAGNOSIS — M5442 Lumbago with sciatica, left side: Secondary | ICD-10-CM | POA: Diagnosis not present

## 2018-01-26 DIAGNOSIS — Z7689 Persons encountering health services in other specified circumstances: Secondary | ICD-10-CM | POA: Diagnosis not present

## 2018-01-26 DIAGNOSIS — G8929 Other chronic pain: Secondary | ICD-10-CM

## 2018-01-26 DIAGNOSIS — M5441 Lumbago with sciatica, right side: Principal | ICD-10-CM

## 2018-01-26 NOTE — Therapy (Signed)
[place], [place], [address] Phone: [phone]   Fax:  [phone]  Physical Therapy Treatment  Patient Details  Name: Cheryl King MRN: [id number] Date of Birth: 06/06/1965 Referring Provider (PT): Charlott Rakes, MD   Encounter Date: 01/26/2018  PT End of Session - 01/26/18 0926    Visit Number  7    Authorization Type  MCD 3 visits 1/01- 1/14    Authorization - Visit Number  2    Authorization - Number of Visits  3    PT Start Time  0930    PT Stop Time  1008    PT Time Calculation (min)  38 min    Activity Tolerance  Patient tolerated treatment well    Behavior During Therapy  [facility] for tasks assessed/performed       Past Medical History:  Diagnosis Date  . Acid reflux   . Allergy   . Carpal tunnel syndrome   . Depression   . Diverticulitis   . Hyperlipidemia   . Low back pain   . Neck pain   . 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 Sciatica   . Seasonal allergies     Past Surgical History:  Procedure Laterality Date  . ABDOMINAL SURGERY    . ANKLE SURGERY     lt.  Marland Kitchen PARTIAL HYSTERECTOMY    . right breast cyst removed     . URETHRAL DIVERTICULUM REPAIR     uretrral diverticultis    There were no vitals filed for this visit.  Subjective Assessment - 01/26/18 0931    Subjective  back hurts. when I do exercises for hips it moves to the back. muscle spasms in Right butt cheek- I have not done any stretches this morning.     Currently in Pain?  Yes    Pain Score  7     Pain Location  Back    Pain Orientation  Mid    Pain Descriptors / Indicators  Aching                       [facility] Adult PT Treatment/Exercise - 01/26/18 0001      Exercises   Exercises  Other Exercises    Other Exercises   AM- LTR, marching, hip ER stretch      Lumbar Exercises: Stretches   Quadruped Mid Back Stretch Limitations  child pose    Quad Stretch Limitations  prone by PT      Lumbar Exercises: Aerobic   Nustep  5 min L4 UE & LE      Lumbar Exercises: Standing   Other Standing Lumbar Exercises  physioball ball press into table 10x5s    Other Standing Lumbar Exercises  hip ext elbows on physioball; physioball press/roll fwd knees edge of table      Manual Therapy   Soft tissue mobilization  roller bil ITB hip external rotators/abd               PT Short Term Goals - 12/29/17 1047      PT SHORT TERM GOAL #1   Title  Pt will be able to demonstrate proper core contraction in supine, seated and standing postures    Status  Achieved      PT SHORT TERM GOAL #2   Title  Pt will be independent in short term HEP as it has been established at target date    Status  Achieved  PT Long Term Goals - 12/01/17 0853      PT LONG TERM GOAL #1   Title  Pt will be indpendent in long term HEP for continued care    Baseline  will progress and establish as appropriate    Time  10   time to accommodate for MCD auth   Period  Weeks    Status  New    Target Date  02/10/18      PT LONG TERM GOAL #2   Title  gross hip strength 5/5    Baseline  see flowsheet    Time  10    Period  Weeks    Status  New    Target Date  02/10/18      PT LONG TERM GOAL #3   Title  pt will demo lumbar AROM equal side to side without increased pain    Baseline  see flowsheet    Time  10    Period  Weeks    Status  New    Target Date  02/10/18      PT LONG TERM GOAL #4   Title  Pt will be able to tolerate a seated position for at least 30 min     Baseline  <10 min at eval    Time  10    Period  Weeks    Status  New    Target Date  02/10/18      PT LONG TERM GOAL #5   Title  Pt will be able to stand/walk for at least 1 hour    Baseline  unable at eval    Time  10    Period  Weeks    Status  New    Target Date  02/10/18            Plan - 01/26/18 1008    Clinical Impression Statement  Pt reported feeling "better than a 7" when leaving today. Added 3  exercises to do right before she gets out of bed to reduce stiffness/spasm and challenged standing core activation- cues were required but pt tolerated well.     PT Treatment/Interventions  ADLs/Self Care Home Management;Cryotherapy;Electrical Stimulation;Ultrasound;Traction;Moist Heat;Therapeutic activities;Therapeutic exercise;Manual techniques;Patient/family education;Passive range of motion;Dry needling;Taping    PT Next Visit Plan  did she try rolling pin at home? Trial ESTIM on Lt hip if she would like, review HEP    PT Home Exercise Plan  ball squeeze with PPT, LTR, figure 4 --> bridge with ball, LTR, cat/camel/child pose, crunch with hundreds, QL stretch; gastroc stretch, supine HSS with ITB, plank at counter with hip ext    Consulted and Agree with Plan of Care  Patient       Patient will benefit from skilled therapeutic intervention in order to improve the following deficits and impairments:  Difficulty walking, Increased muscle spasms, Decreased activity tolerance, Hypermobility, Pain, Improper body mechanics, Postural dysfunction, Decreased strength  Visit Diagnosis: Chronic bilateral low back pain with bilateral sciatica  Muscle weakness (generalized)     Problem List Patient Active Problem List   Diagnosis Date Noted  . Hyperlipidemia 07/18/2017  . Ulnar neuropathy 04/18/2017  . GERD (gastroesophageal reflux disease) 10/19/2016  . Prediabetes 05/03/2016  . Cubital tunnel syndrome on right 03/01/2016  . Numbness and tingling of right arm 01/01/2016  . Chlamydia infection 01/27/2015  . Smoking 12/26/2013  . Chronic lower back pain 04/13/2013  . Sciatica 04/13/2013  . Chronic neck pain 04/13/2013  . Spondylosis of cervical  spine 04/13/2013   Khamari Sheehan C. [name] Ramey PT, DPT 01/26/18 10:10 AM   [facility] [address] [place], [place], [address] Phone: [phone]   Fax:  [phone]  Name: Josee Speece MRN:  [id number] Date of Birth: 1965/03/06

## 2018-01-31 ENCOUNTER — Ambulatory Visit: Payer: Medicaid Other | Admitting: Physical Therapy

## 2018-02-02 ENCOUNTER — Encounter: Payer: Self-pay | Admitting: Physical Therapy

## 2018-02-02 ENCOUNTER — Ambulatory Visit: Payer: Medicaid Other | Admitting: Physical Therapy

## 2018-02-02 DIAGNOSIS — M6281 Muscle weakness (generalized): Secondary | ICD-10-CM

## 2018-02-02 DIAGNOSIS — G8929 Other chronic pain: Secondary | ICD-10-CM

## 2018-02-02 DIAGNOSIS — M5441 Lumbago with sciatica, right side: Principal | ICD-10-CM

## 2018-02-02 DIAGNOSIS — M5442 Lumbago with sciatica, left side: Principal | ICD-10-CM

## 2018-02-02 DIAGNOSIS — Z7689 Persons encountering health services in other specified circumstances: Secondary | ICD-10-CM | POA: Diagnosis not present

## 2018-02-02 MED FILL — ATORVASTATIN 20 MG TABLET: 20 | 90 days supply | Qty: 90 | Fill #1

## 2018-02-02 MED FILL — NAPROXEN 500 MG TABLET: 500 | 30 days supply | Qty: 60 | Fill #3

## 2018-02-02 MED FILL — FLUTICASONE PROP 50 MCG SPR: 50 | 30 days supply | Qty: 16 | Fill #0

## 2018-02-02 MED FILL — OMEPRAZOLE 20 MG CAP: 20 | 30 days supply | Qty: 30 | Fill #0

## 2018-02-02 MED FILL — DULoxetine HCL 60 MG CPEP: 60 | 90 days supply | Qty: 90 | Fill #1

## 2018-02-02 MED FILL — CYCLOBENZAPRINE 10 MG TAB: 10 | 30 days supply | Qty: 60 | Fill #2

## 2018-02-02 NOTE — Therapy (Signed)
[place], [place], [address] Phone: [phone]   Fax:  [phone]  Physical Therapy Treatment/ERO  Patient Details  Name: Cheryl King MRN: [id number] Date of Birth: 1965/05/02 Referring Provider (PT): Charlott Rakes, MD   Encounter Date: 02/02/2018  PT End of Session - 02/02/18 0941    Visit Number  8    Date for PT Re-Evaluation  03/10/18    Authorization Type  MCD reauth submitted 1/16    Authorization - Visit Number  3    Authorization - Number of Visits  3    PT Start Time  0931    PT Stop Time  1000    PT Time Calculation (min)  29 min    Activity Tolerance  Patient tolerated treatment well    Behavior During Therapy  [facility] for tasks assessed/performed       Past Medical History:  Diagnosis Date  . Acid reflux   . Allergy   . Carpal tunnel syndrome   . Depression   . Diverticulitis   . Hyperlipidemia   . Low back pain   . Neck pain   . 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 Sciatica   . Seasonal allergies     Past Surgical History:  Procedure Laterality Date  . ABDOMINAL SURGERY    . ANKLE SURGERY     lt.  Marland Kitchen PARTIAL HYSTERECTOMY    . right breast cyst removed     . URETHRAL DIVERTICULUM REPAIR     uretrral diverticultis    There were no vitals filed for this visit.  Subjective Assessment - 02/02/18 0942    Subjective  Mostly feels hips at night when laying on side, unable to sleep on Left side. Continues to feel muscle spasms in Right butt cheek that are unchanged. Using the ball decreases spasms. Exercises in the AM are overall helpful. Sometimes I feel my lower back but it is my butt cheeks that bother me. I feel like PT has been beneficial. Medications not helping.     Patient Stated Goals  decrease pain, vacuum, yard work    Currently in Pain?  Yes    Pain Score  9     Pain Location  Buttocks    Pain Orientation  Right    Pain Descriptors / Indicators  Stabbing   it  just hurts   Aggravating Factors   always hurts    Pain Relieving Factors  using the ball         [facility] PT Assessment - 02/02/18 0001      Assessment   Medical Diagnosis  LBP with bil sciatica    Referring Provider (PT)  Charlott Rakes, MD    Onset Date/Surgical Date  01/19/07    Next MD Visit  --   March 2020     Sensation   Additional Comments  Right side incr back/buttock pain, Left side worse for bursitis in hip      Posture/Postural Control   Posture Comments  incr lumbar lordosis, decr throacic kyphosis, bil scap winging, Rt scapular depression      AROM   Lumbar - Right Side Bend  50% bil QL pain    Lumbar - Left Side Bend  75% to knee joint bil SIJ pain      Strength   Right Hip Flexion  4+/5    Right Hip Extension  4+/5    Right Hip ABduction  4+/5    Left Hip Flexion  4+/5    Left Hip Extension  5/5    Left Hip ABduction  4+/5      Palpation   Palpation comment  Lt SIJ elevation in trunk flexion, Rt SIJ TTP                   [facility] Adult PT Treatment/Exercise - 02/02/18 0001      Lumbar Exercises: Stretches   Gastroc Stretch  Right;Left;2 reps;30 seconds    Gastroc Stretch Limitations  slant board      Lumbar Exercises: Aerobic   Nustep  5 min L4 UE & LE             PT Education - 02/02/18 1006    Education Details  POC, HEP, goals    Person(s) Educated  Patient    Methods  Explanation    Comprehension  Verbalized understanding;Need further instruction       PT Short Term Goals - 12/29/17 1047      PT SHORT TERM GOAL #1   Title  Pt will be able to demonstrate proper core contraction in supine, seated and standing postures    Status  Achieved      PT SHORT TERM GOAL #2   Title  Pt will be independent in short term HEP as it has been established at target date    Status  Achieved        PT Long Term Goals - 02/02/18 0946      PT LONG TERM GOAL #1   Title  Pt will be indpendent in long term HEP for continued care     Baseline  reports doing HEP, would benefit from further progression indicated by improvements measured today    Status  On-going      PT LONG TERM GOAL #2   Title  gross hip strength 5/5    Baseline  see flowsheet    Status  On-going      PT LONG TERM GOAL #3   Title  pt will demo lumbar AROM equal side to side without increased pain    Baseline  see flowsheet    Status  On-going      PT LONG TERM GOAL #4   Title  Pt will be able to tolerate a seated position for at least 30 min     Baseline  unable to watch 30 min TV show and stay seated, feels that this is slightly longer than before    Status  On-going      PT LONG TERM GOAL #5   Title  Pt will be able to stand/walk for at least 1 hour    Baseline  able to do quick run into store but no longer, has improved from unable to walk comfortably at all    Status  On-going            Plan - 02/02/18 1000    Clinical Impression Statement  Overall pt denies feeling a change in muscle spasms in buttocks but did demonstrate some improvement in strength and activity tolerance as would be expected with chronic pain. Pt had difficulty providing specific time improvements for posture tolerance stating "I have not timed myself". Pt continues to be very guarded in demonstration of ROM due to fear of pain. Reports she is doing exercises and STM at home that are somewhat helpful to control symptoms. Pt will continue to benefit from skilled PT to continue progressing strength and functional ability to meet long term  goals.     PT Frequency  2x / week    PT Duration  4 weeks    PT Treatment/Interventions  ADLs/Self Care Home Management;Cryotherapy;Electrical Stimulation;Ultrasound;Traction;Moist Heat;Therapeutic activities;Therapeutic exercise;Manual techniques;Patient/family education;Passive range of motion;Dry needling;Taping    PT Next Visit Plan  Cont stretching and gross lumbopelvic strengthening- QL stretch, core/glut coordination, CKC glut  activation, review stretches to do throughout the day    PT Home Exercise Plan  ball squeeze with PPT, LTR, figure 4 --> bridge with ball, LTR, cat/camel/child pose, crunch with hundreds, QL stretch; gastroc stretch, supine HSS with ITB, plank at counter with hip ext    Consulted and Agree with Plan of Care  Patient       Patient will benefit from skilled therapeutic intervention in order to improve the following deficits and impairments:  Difficulty walking, Increased muscle spasms, Decreased activity tolerance, Hypermobility, Pain, Improper body mechanics, Postural dysfunction, Decreased strength  Visit Diagnosis: Chronic bilateral low back pain with bilateral sciatica - Plan: PT plan of care cert/re-cert  Muscle weakness (generalized) - Plan: PT plan of care cert/re-cert     Problem List Patient Active Problem List   Diagnosis Date Noted  . Hyperlipidemia 07/18/2017  . Ulnar neuropathy 04/18/2017  . GERD (gastroesophageal reflux disease) 10/19/2016  . Prediabetes 05/03/2016  . Cubital tunnel syndrome on right 03/01/2016  . Numbness and tingling of right arm 01/01/2016  . Chlamydia infection 01/27/2015  . Smoking 12/26/2013  . Chronic lower back pain 04/13/2013  . Sciatica 04/13/2013  . Chronic neck pain 04/13/2013  . Spondylosis of cervical spine 04/13/2013    Evolett Somarriba C. Gianella Chismar PT, DPT 02/02/18 10:10 AM   [place] [facility] [address] [place], [place], [address] Phone: [phone]   Fax:  [phone]  Name: Cheryl King MRN: [id number] Date of Birth: [birth year]

## 2018-02-06 ENCOUNTER — Encounter (HOSPITAL_COMMUNITY): Payer: Self-pay | Admitting: Emergency Medicine

## 2018-02-06 ENCOUNTER — Ambulatory Visit (HOSPITAL_COMMUNITY)
Admission: EM | Admit: 2018-02-06 | Discharge: 2018-02-06 | Disposition: A | Discharge status: Home / Self Care | Payer: Medicaid Other | Attending: Family Medicine | Admitting: Family Medicine

## 2018-02-06 DIAGNOSIS — N760 Acute vaginitis: Secondary | ICD-10-CM

## 2018-02-06 LAB — POCT URINALYSIS DIP (DEVICE)
Bilirubin Urine: NEGATIVE
Glucose, UA: NEGATIVE mg/dL
Hgb urine dipstick: NEGATIVE
Ketones, ur: NEGATIVE mg/dL
Nitrite: NEGATIVE
Protein, ur: NEGATIVE mg/dL
Specific Gravity, Urine: 1.025 (ref 1.005–1.030)
Urobilinogen, UA: 0.2 mg/dL (ref 0.0–1.0)
pH: 5.5 (ref 5.0–8.0)

## 2018-02-06 MED ORDER — METRONIDAZOLE 0.75 % VA GEL: 1.0000 | Freq: Two times a day (BID) | VAGINAL | 0 refills | Status: DC

## 2018-02-06 MED FILL — metroNIDAZOLE 0.75 % GEL: 0.75 | 5 days supply | Qty: 70 | Fill #0

## 2018-02-06 NOTE — ED Notes (Signed)
Urine placed in lab 

## 2018-02-06 NOTE — Discharge Instructions (Addendum)
Begin using metrogel 1-2 times daily for 5 days to treat for BV  Urine did not show signs of infection  Follow up if symptoms not resolving

## 2018-02-06 NOTE — ED Provider Notes (Signed)
[place]    CSN: [id number] Arrival date & time: 02/06/18  2376     History   Chief Complaint Chief Complaint  Patient presents with  . Vaginal Discharge    HPI Cheryl King is a 53 y.o. female history of hysterectomy, hyperlipidemia, prediabetes, presenting today for evaluation of vaginal irritation and discharge.  Patient states that her symptoms began approximately 1 week ago.  She has had some occasional discomfort and burning with urination.  Symptoms feel like BV.  She has this occasionally.  Denies history of yeast.  Patient denies being sexually active.  Patient does note that she recently switched detergents and also applied a type of baby oil to her legs that may have triggered it.  Denies any rashes or lesions.  Denies abdominal pain, fever, nausea or vomiting.  HPI  Past Medical History:  Diagnosis Date  . Acid reflux   . Allergy   . Carpal tunnel syndrome   . Depression   . Diverticulitis   . Hyperlipidemia   . Low back pain   . Neck pain   . 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 Sciatica   . Seasonal allergies     Patient Active Problem List   Diagnosis Date Noted  . Hyperlipidemia 07/18/2017  . Ulnar neuropathy 04/18/2017  . GERD (gastroesophageal reflux disease) 10/19/2016  . Prediabetes 05/03/2016  . Cubital tunnel syndrome on right 03/01/2016  . Numbness and tingling of right arm 01/01/2016  . Chlamydia infection 01/27/2015  . Smoking 12/26/2013  . Chronic lower back pain 04/13/2013  . Sciatica 04/13/2013  . Chronic neck pain 04/13/2013  . Spondylosis of cervical spine 04/13/2013    Past Surgical History:  Procedure Laterality Date  . ABDOMINAL SURGERY    . ANKLE SURGERY     lt.  Marland Kitchen PARTIAL HYSTERECTOMY    . right breast cyst removed     . URETHRAL DIVERTICULUM REPAIR     uretrral diverticultis    OB History    Gravida  1   Para      Term      Preterm      AB  1   Living        SAB  1   TAB      Ectopic        Multiple      Live Births               Home Medications    Prior to Admission medications   Medication Sig Start Date End Date Taking? Authorizing Provider  acetaminophen-codeine (TYLENOL #3) 300-30 MG tablet TAKE 1 TABLET BY MOUTH EVERY 8 HOURS AS NEEDED FOR MODERATE PAIN. 08/23/17   Newt Minion, MD  atorvastatin (LIPITOR) 20 MG tablet Take 1 tablet (20 mg total) by mouth daily. 10/18/17   Charlott Rakes, MD  butalbital-acetaminophen-caffeine (FIORICET, ESGIC) 50-325-40 MG tablet Take 1 tablet by mouth every 12 (twelve) hours as needed for headache. 04/18/17 04/18/18  Charlott Rakes, MD  cetirizine (ZYRTEC) 10 MG tablet Take 1 tablet (10 mg total) by mouth daily. 04/18/17   Charlott Rakes, MD  cyclobenzaprine (FLEXERIL) 10 MG tablet TAKE 1 TABLET BY MOUTH 2 (TWO) TIMES DAILY AS NEEDED FOR MUSCLE SPASMS. 10/18/17   Charlott Rakes, MD  diclofenac sodium (VOLTAREN) 1 % GEL Apply 2 g topically 4 (four) times daily. 03/18/17   Jessy Oto, MD  DULoxetine (CYMBALTA) 60 MG capsule Take 1 capsule (60 mg total) by mouth daily. 10/18/17  Charlott Rakes, MD  fluticasone (FLONASE) 50 MCG/ACT nasal spray PLACE 2 SPRAYS INTO BOTH NOSTRILS DAILY. 01/25/18   Charlott Rakes, MD  Lactobacillus CAPS Take 1 capsule by mouth daily.    [provider]  metroNIDAZOLE (METROGEL) 0.75 % vaginal gel Place 1 Applicatorful vaginally 2 (two) times daily. 02/06/18   Wieters, Hallie C, PA-C  Multiple Vitamin (MULTIVITAMIN WITH MINERALS) TABS tablet Take 1 tablet by mouth daily.    [provider]  naproxen (NAPROSYN) 500 MG tablet TAKE 1 TABLET BY MOUTH 2 TIMES DAILY WITH A MEAL. 09/26/17   Jessy Oto, MD  nicotine (NICODERM CQ) 14 mg/24hr patch Place 1 patch (14 mg total) onto the skin daily. For 4 weeks then 7mg /24hr daily for  4 weeks 07/18/17   Charlott Rakes, MD  omeprazole (PRILOSEC) 20 MG capsule Take 1 capsule (20 mg total) by mouth daily. 10/18/17   Newlin, Charlane Ferretti, MD  ondansetron  (ZOFRAN) 4 MG tablet TAKE 1 TABLET BY MOUTH EVERY 8 HOURS AS NEEDED FOR NAUSEA OR VOMITING. 10/19/16   Charlott Rakes, MD  predniSONE (STERAPRED UNI-PAK 21 TAB) 10 MG (21) TBPK tablet Take by mouth daily. Take 6 tabs by mouth daily for 2 days, then 5 tabs for 2 days, then 4 tabs for 2 days, then 3 tabs for 2 days, 2 tabs for 2 days, then 1 tab by mouth daily for 2 days 10/04/17   Wieters, Hallie C, PA-C  pregabalin (LYRICA) 50 MG capsule Take 1 capsule (50 mg total) by mouth at bedtime. 07/18/17   Charlott Rakes, MD  RESTASIS 0.05 % ophthalmic emulsion  04/13/17   [provider]  traMADol (ULTRAM) 50 MG tablet Take by mouth every 6 (six) hours as needed.    [provider]  triamcinolone cream (KENALOG) 0.1 % Apply 1 application topically 2 (two) times daily. 07/18/17   Charlott Rakes, MD    Family History Family History  Problem Relation Age of Onset  . Healthy Mother   . Other Father        Unsure of medical history  . Asthma Maternal Aunt   . Diabetes Maternal Aunt   . Cancer Maternal Aunt   . Breast cancer Maternal Aunt   . Hypertension Maternal Grandmother   . Colon cancer Neg Hx   . Pancreatic cancer Neg Hx   . Rectal cancer Neg Hx   . Stomach cancer Neg Hx   . Colon polyps Neg Hx   . Esophageal cancer Neg Hx     Social History Social History   Tobacco Use  . Smoking status: Current Every Day Smoker    Packs/day: 0.50    Years: 35.00    Pack years: 17.50    Types: Cigarettes  . Smokeless tobacco: Never Used  Substance Use Topics  . Alcohol use: Yes    Comment: Social only - seldom  . Drug use: No     Allergies   Percocet [oxycodone-acetaminophen]; Vicodin [hydrocodone-acetaminophen]; and Penicillins   Review of Systems Review of Systems  Constitutional: Negative for fever.  Respiratory: Negative for shortness of breath.   Cardiovascular: Negative for chest pain.  Gastrointestinal: Negative for abdominal pain, diarrhea, nausea and vomiting.    Genitourinary: Positive for dysuria and vaginal discharge. Negative for flank pain, genital sores, hematuria, menstrual problem, vaginal bleeding and vaginal pain.  Musculoskeletal: Negative for back pain.  Skin: Negative for rash.  Neurological: Negative for dizziness, light-headedness and headaches.     Physical Exam Triage Vital Signs  ED Triage Vitals  Enc Vitals Group     BP 02/06/18 1028 107/70     Pulse Rate 02/06/18 1028 87     Resp 02/06/18 1028 18     Temp 02/06/18 1028 99.2 F (37.3 C)     Temp Source 02/06/18 1028 Temporal     SpO2 02/06/18 1028 95 %     Weight --      Height --      Head Circumference --      Peak Flow --      Pain Score 02/06/18 1029 4     Pain Loc --      Pain Edu? --      Excl. in [place]? --    No data found.  Updated Vital Signs BP 107/70 (BP Location: Right Arm)   Pulse 87   Temp 99.2 F (37.3 C) (Temporal)   Resp 18   SpO2 95%   Visual Acuity Right Eye Distance:   Left Eye Distance:   Bilateral Distance:    Right Eye Near:   Left Eye Near:    Bilateral Near:     Physical Exam Vitals signs and nursing note reviewed.  Constitutional:      Appearance: She is well-developed.     Comments: No acute distress  HENT:     Head: Normocephalic and atraumatic.     Nose: Nose normal.  Eyes:     Conjunctiva/sclera: Conjunctivae normal.  Neck:     Musculoskeletal: Neck supple.  Cardiovascular:     Rate and Rhythm: Normal rate.  Pulmonary:     Effort: Pulmonary effort is normal. No respiratory distress.  Abdominal:     General: There is no distension.     Comments: Abdomen soft, nondistended, nontender to light deep palpation throughout abdomen  Genitourinary:    Comments: Deferred Musculoskeletal: Normal range of motion.  Skin:    General: Skin is warm and dry.  Neurological:     Mental Status: She is alert and oriented to person, place, and time.      UC Treatments / Results  Labs (all labs ordered are listed, but only  abnormal results are displayed) Labs Reviewed  POCT URINALYSIS DIP (DEVICE) - Abnormal; Notable for the following components:      Result Value   Leukocytes, UA TRACE (*)    All other components within normal limits  URINE CULTURE    EKG None  Radiology No results found.  Procedures Procedures (including critical care time)  Medications Ordered in UC Medications - No data to display  Initial Impression / Assessment and Plan / UC Course  I have reviewed the triage vital signs and the nursing notes.  Pertinent labs & imaging results that were available during my care of the patient were reviewed by me and considered in my medical decision making (see chart for details).     Patient likely with BV, will will treat with MetroGel per her request.  UA negative for signs of infection, trace leuks, will send off for culture to confirm.Discussed strict return precautions. Patient verbalized understanding and is agreeable with plan.  Final Clinical Impressions(s) / UC Diagnoses   Final diagnoses:  Acute vaginitis     Discharge Instructions     Begin using metrogel 1-2 times daily for 5 days to treat for BV  Urine did not show signs of infection  Follow up if symptoms not resolving   ED Prescriptions    Medication Sig Dispense Auth. Provider  metroNIDAZOLE (METROGEL) 0.75 % vaginal gel Place 1 Applicatorful vaginally 2 (two) times daily. 70 g Wieters, [place] C, PA-C     Controlled Substance Prescriptions [place] Controlled Substance Registry consulted? Not Applicable   Janith Lima, [place] 02/06/18 1115

## 2018-02-06 NOTE — ED Triage Notes (Signed)
Pt here for vaginal discharge that she thinks is BV

## 2018-02-07 DIAGNOSIS — Z7689 Persons encountering health services in other specified circumstances: Secondary | ICD-10-CM | POA: Diagnosis not present

## 2018-02-08 LAB — URINE CULTURE: Culture: >=100000 — AB

## 2018-02-09 ENCOUNTER — Telehealth (HOSPITAL_COMMUNITY): Payer: Self-pay | Admitting: Emergency Medicine

## 2018-02-09 MED ORDER — NITROFURANTOIN MONOHYD MACRO 100 MG PO CAPS: 100.0000 mg | ORAL_CAPSULE | Freq: Two times a day (BID) | ORAL | 0 refills | Status: AC

## 2018-02-09 MED FILL — NITROFURANTOIN MONO-MCR 100: 100 | 5 days supply | Qty: 10 | Fill #0 | Status: TO

## 2018-02-09 NOTE — Telephone Encounter (Signed)
Not treated, patient culture positive for e coli. Contacted patient and made aware. Will send in MACROBID (allergy to penicillin) to preferred pharmacy. All questions answered.

## 2018-02-14 ENCOUNTER — Ambulatory Visit: Payer: Medicaid Other | Admitting: Physical Therapy

## 2018-02-16 ENCOUNTER — Ambulatory Visit: Payer: Medicaid Other | Admitting: Physical Therapy

## 2018-02-21 ENCOUNTER — Ambulatory Visit: Payer: Medicaid Other | Admitting: Physical Therapy

## 2018-02-22 ENCOUNTER — Ambulatory Visit: Payer: Medicaid Other | Admitting: Family Medicine

## 2018-02-23 ENCOUNTER — Ambulatory Visit: Payer: Medicaid Other | Attending: Family Medicine | Admitting: Physical Therapy

## 2018-02-23 DIAGNOSIS — M5442 Lumbago with sciatica, left side: Secondary | ICD-10-CM | POA: Insufficient documentation

## 2018-02-23 DIAGNOSIS — M6281 Muscle weakness (generalized): Secondary | ICD-10-CM | POA: Insufficient documentation

## 2018-02-23 DIAGNOSIS — G8929 Other chronic pain: Secondary | ICD-10-CM | POA: Diagnosis not present

## 2018-02-23 DIAGNOSIS — M5441 Lumbago with sciatica, right side: Secondary | ICD-10-CM | POA: Diagnosis not present

## 2018-02-23 DIAGNOSIS — Z7689 Persons encountering health services in other specified circumstances: Secondary | ICD-10-CM | POA: Diagnosis not present

## 2018-02-23 NOTE — Therapy (Signed)
[place], [place], [address] Phone: [phone]   Fax:  [phone]  Physical Therapy Treatment  Patient Details  Name: Cheryl King MRN: [id number] Date of Birth: 04-03-1965 Referring Provider (PT): Charlott Rakes, MD   Encounter Date: 02/23/2018    Past Medical History:  Diagnosis Date  . Acid reflux   . Allergy   . Carpal tunnel syndrome   . Depression   . Diverticulitis   . Hyperlipidemia   . Low back pain   . Neck pain   . 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 Sciatica   . Seasonal allergies     Past Surgical History:  Procedure Laterality Date  . ABDOMINAL SURGERY    . ANKLE SURGERY     lt.  Marland Kitchen PARTIAL HYSTERECTOMY    . right breast cyst removed     . URETHRAL DIVERTICULUM REPAIR     uretrral diverticultis    There were no vitals filed for this visit.  Subjective Assessment - 02/23/18 0955    Subjective  Pt arriving to [facility] reporting pain in bilateral hips of 5/10 and low back of 5/10. Pt reporting trying her exercises issued at last visit.     How long can you sit comfortably?  10 min    How long can you walk comfortably?  about 20 minutes    Patient Stated Goals  decrease pain, vacuum, yard work    Currently in Pain?  Yes    Pain Score  5     Pain Location  Back    Pain Orientation  Lower    Pain Descriptors / Indicators  Aching    Pain Type  Chronic pain    Pain Onset  More than a month ago    Pain Frequency  Constant    Multiple Pain Sites  Yes    Pain Score  5    Pain Location  Hip    Pain Orientation  Right;Left    Pain Descriptors / Indicators  Aching;Discomfort;Sore    Pain Onset  More than a month ago    Pain Frequency  Constant    Aggravating Factors   lying on my side, walking and standing long periods                       [facility] Adult PT Treatment/Exercise - 02/23/18 0001      Exercises   Exercises  Lumbar      Lumbar Exercises:  Supine   Clam  10 reps;2 seconds;Limitations    Clam Limitations  red theraband 2 sets    Bridge  10 reps;3 seconds    Bridge with Ball Squeeze  10 reps;3 seconds    Straight Leg Raise  10 reps;2 seconds    Other Supine Lumbar Exercises  marching x 30 reps      Lumbar Exercises: Sidelying   Clam  Both;10 reps;3 seconds    Hip Abduction  Both;10 reps;3 seconds      Moist Heat Therapy   Number Minutes Moist Heat  10 Minutes    Moist Heat Location  Hip   bilateral     Manual Therapy   Manual Therapy  Soft tissue mobilization    Manual therapy comments  rolling to bilateral hips, IT bands             PT Education - 02/23/18 0958    Education Details  reviewed HEP    Person(s) Educated  Patient    Methods  Explanation;Demonstration    Comprehension  Verbalized understanding;Returned demonstration       PT Short Term Goals - 02/23/18 1009      PT SHORT TERM GOAL #1   Title  Pt will be able to demonstrate proper core contraction in supine, seated and standing postures    Baseline  began educating at eval    Time  5    Period  Weeks    Status  On-going      PT SHORT TERM GOAL #2   Title  Pt will be independent in short term HEP as it has been established at target date    Baseline  began establishing at eval    Status  Achieved      PT [place] #3   Title  pt will decreased neck pain to <5/10 to assist with functional exercise 05/27/2014    Time  4    Period  Weeks    Status  Achieved      PT SHORT TERM GOAL #4   Title  pt will increase R shoulder flexion/abduction to > 4-/5 to assist with ADL's 05/27/2014    Status  On-going      PT SHORT TERM GOAL #5   Title  (low back) pt will decrease low back pain to < 5/10 during and following standing and walking for > 5-10 min to assist with increased funcitonal endurance ( 07/17/2014)    Status  On-going        PT Long Term Goals - 02/23/18 1009      PT LONG TERM GOAL #1   Title  Pt will be indpendent in long  term HEP for continued care    Baseline  reports doing HEP, would benefit from further progression indicated by improvements measured today    Time  10    Period  Weeks    Status  New      PT LONG TERM GOAL #2   Title  gross hip strength 5/5    Baseline  see flowsheet    Time  10    Period  Weeks    Status  On-going      PT LONG TERM GOAL #3   Title  pt will demo lumbar AROM equal side to side without increased pain    Baseline  see flowsheet    Time  10    Period  Weeks    Status  On-going      PT LONG TERM GOAL #4   Title  Pt will be able to tolerate a seated position for at least 30 min     Baseline  unable to watch 30 min TV show and stay seated, feels that this is slightly longer than before    Time  10    Period  Weeks    Status  On-going      PT LONG TERM GOAL #5   Title  Pt will be able to stand/walk for at least 1 hour    Baseline  able to do quick run into store but no longer, has improved from unable to walk comfortably at all    Period  Weeks    Status  On-going            Plan - 02/23/18 1004    Clinical Impression Statement  Pt presenting with low back pain and bilateral hip pain today. Pt tolerating all exericses well and reviewed her HEP.  Soft tissue work to bilateral IT bands and glutes.  Continue skilled PT to progress toward LTG's.     Rehab Potential  Good    PT Frequency  2x / week    PT Duration  4 weeks    PT Treatment/Interventions  ADLs/Self Care Home Management;Cryotherapy;Electrical Stimulation;Ultrasound;Traction;Moist Heat;Therapeutic activities;Therapeutic exercise;Manual techniques;Patient/family education;Passive range of motion;Dry needling;Taping    PT Next Visit Plan  Cont stretching and gross lumbopelvic strengthening- QL stretch, core/glut coordination, CKC glut activation, review stretches to do throughout the day    PT Home Exercise Plan  ball squeeze with PPT, LTR, figure 4 --> bridge with ball, LTR, cat/camel/child pose, crunch  with hundreds, QL stretch; gastroc stretch, supine HSS with ITB, plank at counter with hip ext    Consulted and Agree with Plan of Care  Patient       Patient will benefit from skilled therapeutic intervention in order to improve the following deficits and impairments:  Difficulty walking, Increased muscle spasms, Decreased activity tolerance, Hypermobility, Pain, Improper body mechanics, Postural dysfunction, Decreased strength  Visit Diagnosis: Chronic bilateral low back pain with bilateral sciatica  Muscle weakness (generalized)     Problem List Patient Active Problem List   Diagnosis Date Noted  . Hyperlipidemia 07/18/2017  . Ulnar neuropathy 04/18/2017  . GERD (gastroesophageal reflux disease) 10/19/2016  . Prediabetes 05/03/2016  . Cubital tunnel syndrome on right 03/01/2016  . Numbness and tingling of right arm 01/01/2016  . Chlamydia infection 01/27/2015  . Smoking 12/26/2013  . Chronic lower back pain 04/13/2013  . Sciatica 04/13/2013  . Chronic neck pain 04/13/2013  . Spondylosis of cervical spine 04/13/2013    Oretha Caprice, PT 02/23/2018, 10:17 AM  [facility] [address] [place], [place], [address] Phone: [phone]   Fax:  [phone]  Name: Cheryl King MRN: [id number] Date of Birth: 1965/10/31

## 2018-02-28 ENCOUNTER — Ambulatory Visit: Payer: Medicaid Other | Admitting: Physical Therapy

## 2018-02-28 ENCOUNTER — Encounter: Payer: Self-pay | Admitting: Physical Therapy

## 2018-02-28 DIAGNOSIS — Z7689 Persons encountering health services in other specified circumstances: Secondary | ICD-10-CM | POA: Diagnosis not present

## 2018-02-28 DIAGNOSIS — G8929 Other chronic pain: Secondary | ICD-10-CM

## 2018-02-28 DIAGNOSIS — M5441 Lumbago with sciatica, right side: Secondary | ICD-10-CM | POA: Diagnosis not present

## 2018-02-28 DIAGNOSIS — M5442 Lumbago with sciatica, left side: Secondary | ICD-10-CM | POA: Diagnosis not present

## 2018-02-28 DIAGNOSIS — M6281 Muscle weakness (generalized): Secondary | ICD-10-CM

## 2018-02-28 NOTE — Therapy (Signed)
[place], [place], [address] Phone: [phone]   Fax:  [phone]  Physical Therapy Treatment  Patient Details  Name: Cheryl King MRN: [id number] Date of Birth: 09/15/1965 Referring Provider (PT): Charlott Rakes, MD   Encounter Date: 02/28/2018  PT End of Session - 02/28/18 0933    Visit Number  9    Date for PT Re-Evaluation  03/10/18    Authorization Type  8 1/27-2/23    Authorization - Visit Number  2    Authorization - Number of Visits  8    PT Start Time  0933    PT Stop Time  1015    PT Time Calculation (min)  42 min    Activity Tolerance  Patient tolerated treatment well    Behavior During Therapy  [facility] for tasks assessed/performed       Past Medical History:  Diagnosis Date  . Acid reflux   . Allergy   . Carpal tunnel syndrome   . Depression   . Diverticulitis   . Hyperlipidemia   . Low back pain   . Neck pain   . Sacroiliac inflammation ([place])   . Sciatica   . Seasonal allergies     Past Surgical History:  Procedure Laterality Date  . ABDOMINAL SURGERY    . ANKLE SURGERY     lt.  Marland Kitchen PARTIAL HYSTERECTOMY    . right breast cyst removed     . URETHRAL DIVERTICULUM REPAIR     uretrral diverticultis    There were no vitals filed for this visit.  Subjective Assessment - 02/28/18 0933    Subjective  still feels that bursitis is causing the majority of her hip pain. over the weekend I could barely stand up straight due to sharp pain. had intercourse on Wed and I think that irritated my back.     Patient Stated Goals  decrease pain, vacuum, yard work    Currently in Pain?  Yes    Pain Score  --   better than 5   Pain Location  Back    Pain Orientation  Lower    Pain Descriptors / Indicators  Aching                       [facility] Adult PT Treatment/Exercise - 02/28/18 0001      Lumbar Exercises: Stretches   Figure 4 Stretch  2 reps;30 seconds   bilat      Lumbar Exercises: Aerobic   Nustep  5 min Ue & LE L6      Lumbar Exercises: Machines for Strengthening   Cybex Knee Flexion  25lb bil LE    Leg Press  2 plates bil LE      Lumbar Exercises: Standing   Other Standing Lumbar Exercises  hip ext with core contraction elbows on counter      Manual Therapy   Soft tissue mobilization  IASTM bil gluts& TFL, piriformis             PT Education - 02/28/18 1116    Education Details  posture, DOMS, sex and back pain, pelvic floor activation    Person(s) Educated  Patient    Methods  Explanation    Comprehension  Verbalized understanding;Need further instruction       PT Short Term Goals - 02/23/18 1009      PT SHORT TERM GOAL #1   Title  Pt will be able to demonstrate proper  core contraction in supine, seated and standing postures    Baseline  began educating at eval    Time  5    Period  Weeks    Status  On-going      PT SHORT TERM GOAL #2   Title  Pt will be independent in short term HEP as it has been established at target date    Baseline  began establishing at eval    Status  Achieved      PT [place] #3   Title  pt will decreased neck pain to <5/10 to assist with functional exercise 05/27/2014    Time  4    Period  Weeks    Status  Achieved      PT SHORT TERM GOAL #4   Title  pt will increase R shoulder flexion/abduction to > 4-/5 to assist with ADL's 05/27/2014    Status  On-going      PT SHORT TERM GOAL #5   Title  (low back) pt will decrease low back pain to < 5/10 during and following standing and walking for > 5-10 min to assist with increased funcitonal endurance ( 07/17/2014)    Status  On-going        PT Long Term Goals - 02/23/18 1009      PT LONG TERM GOAL #1   Title  Pt will be indpendent in long term HEP for continued care    Baseline  reports doing HEP, would benefit from further progression indicated by improvements measured today    Time  10    Period  Weeks    Status  New      PT  LONG TERM GOAL #2   Title  gross hip strength 5/5    Baseline  see flowsheet    Time  10    Period  Weeks    Status  On-going      PT LONG TERM GOAL #3   Title  pt will demo lumbar AROM equal side to side without increased pain    Baseline  see flowsheet    Time  10    Period  Weeks    Status  On-going      PT LONG TERM GOAL #4   Title  Pt will be able to tolerate a seated position for at least 30 min     Baseline  unable to watch 30 min TV show and stay seated, feels that this is slightly longer than before    Time  10    Period  Weeks    Status  On-going      PT LONG TERM GOAL #5   Title  Pt will be able to stand/walk for at least 1 hour    Baseline  able to do quick run into store but no longer, has improved from unable to walk comfortably at all    Period  Weeks    Status  On-going            Plan - 02/28/18 1015    Clinical Impression Statement  pt with significant trigger points in bil gluts and piriformis without referred pain; reduced with IASTM. Reports symptoms of incontinence with coughing/sneezing and increased pain after intercourse. I sent her with a note for primary and asked her to request a pelvic exam sooner than her annual for evaluation of pelvic floor contraction. pt verbalized understanding. good tolerance to exercises today and able to stand with good posture but  muscles fatigued quickly.     PT Treatment/Interventions  ADLs/Self Care Home Management;Cryotherapy;Electrical Stimulation;Ultrasound;Traction;Moist Heat;Therapeutic activities;Therapeutic exercise;Manual techniques;Patient/family education;Passive range of motion;Dry needling;Taping    PT Next Visit Plan  lumbopelvic strengthening. upright posture training in standing    PT Home Exercise Plan  ball squeeze with PPT, LTR, figure 4 --> bridge with ball, LTR, cat/camel/child pose, crunch with hundreds, QL stretch; gastroc stretch, supine HSS with ITB, plank at counter with hip ext    Consulted and  Agree with Plan of Care  Patient       Patient will benefit from skilled therapeutic intervention in order to improve the following deficits and impairments:  Difficulty walking, Increased muscle spasms, Decreased activity tolerance, Hypermobility, Pain, Improper body mechanics, Postural dysfunction, Decreased strength  Visit Diagnosis: Chronic bilateral low back pain with bilateral sciatica  Muscle weakness (generalized)     Problem List Patient Active Problem List   Diagnosis Date Noted  . Hyperlipidemia 07/18/2017  . Ulnar neuropathy 04/18/2017  . GERD (gastroesophageal reflux disease) 10/19/2016  . Prediabetes 05/03/2016  . Cubital tunnel syndrome on right 03/01/2016  . Numbness and tingling of right arm 01/01/2016  . Chlamydia infection 01/27/2015  . Smoking 12/26/2013  . Chronic lower back pain 04/13/2013  . Sciatica 04/13/2013  . Chronic neck pain 04/13/2013  . Spondylosis of cervical spine 04/13/2013   Ava Tangney C. Rayn Shorb PT, DPT 02/28/18 11:17 AM   [facility] [address] [place], [place], [address] Phone: [phone]   Fax:  [phone]  Name: Cheryl King MRN: [id number] Date of Birth: 02-Jan-1966

## 2018-03-02 ENCOUNTER — Ambulatory Visit: Payer: Medicaid Other | Admitting: Physical Therapy

## 2018-03-03 ENCOUNTER — Telehealth: Payer: Self-pay | Admitting: Physical Therapy

## 2018-03-03 NOTE — Telephone Encounter (Signed)
Left message regarding no show on 03/02/18 and advised of next appointment time. Requested call back if she needs to reschedule. Valerio Pinard C. Desirea Mizrahi PT, DPT 03/03/18 9:02 AM

## 2018-03-07 ENCOUNTER — Ambulatory Visit: Payer: Medicaid Other | Admitting: Physical Therapy

## 2018-03-07 ENCOUNTER — Encounter: Payer: Self-pay | Admitting: Physical Therapy

## 2018-03-07 DIAGNOSIS — M5442 Lumbago with sciatica, left side: Principal | ICD-10-CM

## 2018-03-07 DIAGNOSIS — M6281 Muscle weakness (generalized): Secondary | ICD-10-CM | POA: Diagnosis not present

## 2018-03-07 DIAGNOSIS — G8929 Other chronic pain: Secondary | ICD-10-CM

## 2018-03-07 DIAGNOSIS — M5441 Lumbago with sciatica, right side: Secondary | ICD-10-CM | POA: Diagnosis not present

## 2018-03-07 DIAGNOSIS — Z7689 Persons encountering health services in other specified circumstances: Secondary | ICD-10-CM | POA: Diagnosis not present

## 2018-03-07 NOTE — Therapy (Signed)
[place], [place], [address] Phone: [phone]   Fax:  [phone]  Physical Therapy Treatment/Discharge  Patient Details  Name: Cheryl King MRN: [id number] Date of Birth: 1965-08-19 Referring Provider (PT): Charlott Rakes, MD   Encounter Date: 03/07/2018  PT End of Session - 03/07/18 0936    Visit Number  10    Date for PT Re-Evaluation  03/10/18    Authorization Type  8 1/27-2/23    Authorization - Visit Number  3    Authorization - Number of Visits  8    PT Start Time  2992    PT Stop Time  1014    PT Time Calculation (min)  40 min    Activity Tolerance  Patient tolerated treatment well    Behavior During Therapy  [facility] for tasks assessed/performed       Past Medical History:  Diagnosis Date  . Acid reflux   . Allergy   . Carpal tunnel syndrome   . Depression   . Diverticulitis   . Hyperlipidemia   . Low back pain   . Neck pain   . 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 Sciatica   . Seasonal allergies     Past Surgical History:  Procedure Laterality Date  . ABDOMINAL SURGERY    . ANKLE SURGERY     lt.  Marland Kitchen PARTIAL HYSTERECTOMY    . right breast cyst removed     . URETHRAL DIVERTICULUM REPAIR     uretrral diverticultis    There were no vitals filed for this visit.  Subjective Assessment - 03/07/18 0938    Subjective  back still hurts a little, bursitis mostly. swept and washed sheets yesterday which increased pain.     Patient Stated Goals  decrease pain, vacuum, yard work    Currently in Pain?  Yes    Pain Score  5     Pain Location  Back    Pain Orientation  Lower    Pain Descriptors / Indicators  Sore    Aggravating Factors   constant     Pain Relieving Factors  massage         [facility] PT Assessment - 03/07/18 0001      Assessment   Medical Diagnosis  LBP with bil sciatica    Referring Provider (PT)  Charlott Rakes, MD    Onset Date/Surgical Date  01/19/07    Next  MD Visit  --   2/20     Sensation   Additional Comments  about 1/week spasm in buttocks      Posture/Postural Control   Posture Comments  Rt scapular depression, flat thoracic spine, mild incr lumbar lordosis with ant pelvic tilt, bil genu recurvatum      AROM   Lumbar - Right Side Bend  50% bil glut pain    Lumbar - Left Side Bend  50% bil glut pain      Strength   Right Hip Flexion  4+/5    Right Hip Extension  5/5    Right Hip ABduction  4+/5    Left Hip Flexion  4+/5    Left Hip Extension  5/5    Left Hip ABduction  5/5      Palpation   Palpation comment  TTP Rt SIJ & bil glut med and superior glut max                   [facility] Adult PT Treatment/Exercise -  03/07/18 0001      Lumbar Exercises: Stretches   Passive Hamstring Stretch Limitations  supine with strap, lateral bias      Lumbar Exercises: Standing   Other Standing Lumbar Exercises  hip ext with core contraction elbows on counter      Lumbar Exercises: Quadruped   Single Arm Raise  Right;Left;10 reps    Straight Leg Raise  10 reps   both   Opposite Arm/Leg Raise  Right arm/Left leg;Left arm/Right leg;10 reps    Other Quadruped Lumbar Exercises  qped TrA contraction             PT Education - 03/07/18 1015    Education Details  goals discussion, HEP, antomy of core musculature    Person(s) Educated  Patient    Methods  Explanation;Demonstration;Tactile cues;Verbal cues;Handout    Comprehension  Verbalized understanding;Returned demonstration;Verbal cues required;Tactile cues required       PT Short Term Goals - 02/23/18 1009      PT SHORT TERM GOAL #1   Title  Pt will be able to demonstrate proper core contraction in supine, seated and standing postures    Baseline  began educating at eval    Time  5    Period  Weeks    Status  On-going      PT SHORT TERM GOAL #2   Title  Pt will be independent in short term HEP as it has been established at target date    Baseline  began  establishing at eval    Status  Achieved      PT SHORT TERM GOAL #3   Title  pt will decreased neck pain to <5/10 to assist with functional exercise 05/27/2014    Time  4    Period  Weeks    Status  Achieved      PT SHORT TERM GOAL #4   Title  pt will increase R shoulder flexion/abduction to > 4-/5 to assist with ADL's 05/27/2014    Status  On-going      PT SHORT TERM GOAL #5   Title  (low back) pt will decrease low back pain to < 5/10 during and following standing and walking for > 5-10 min to assist with increased funcitonal endurance ( 07/17/2014)    Status  On-going        PT Long Term Goals - 03/07/18 0943      PT LONG TERM GOAL #1   Title  Pt will be indpendent in long term HEP for continued care    Baseline  doing HEP    Status  Achieved      PT LONG TERM GOAL #2   Title  gross hip strength 5/5    Baseline  see flowsheet      PT LONG TERM GOAL #3   Title  pt will demo lumbar AROM equal side to side without increased pain    Baseline  see flowsheet      PT LONG TERM GOAL #4   Title  Pt will be able to tolerate a seated position for at least 30 min     Baseline  able to stay seated but has to move around and stretch    Status  Partially Met      PT LONG TERM GOAL #5   Title  Pt will be able to stand/walk for at least 1 hour    Baseline  does not believe she can do this    Status  Not Met            Plan - 03/07/18 1015    Clinical Impression Statement  Pt has made progress toward her goals since beginning PT and reports decreased pain levels. At this time she has reached a plateau from treatment on orthopedic treatment and will benefit from further treatment by pelvic floor specialist. I will d/c her today and she is following up with MD on Thursday for referral to pelvic floor rehab. Pt verbalized understanding and was encouraged to contact us with any further questions.     PT Treatment/Interventions  ADLs/Self Care Home Management;Cryotherapy;Electrical  Stimulation;Ultrasound;Traction;Moist Heat;Therapeutic activities;Therapeutic exercise;Manual techniques;Patient/family education;Passive range of motion;Dry needling;Taping    PT Home Exercise Plan  ball squeeze with PPT, LTR, figure 4 --> bridge with ball, LTR, cat/camel/child pose, crunch with hundreds, QL stretch; gastroc stretch, supine HSS with ITB, plank at counter with hip ext; qped bird dog    Consulted and Agree with Plan of Care  Patient       Patient will benefit from skilled therapeutic intervention in order to improve the following deficits and impairments:  Difficulty walking, Increased muscle spasms, Decreased activity tolerance, Hypermobility, Pain, Improper body mechanics, Postural dysfunction, Decreased strength  Visit Diagnosis: Chronic bilateral low back pain with bilateral sciatica  Muscle weakness (generalized)     Problem List Patient Active Problem List   Diagnosis Date Noted  . Hyperlipidemia 07/18/2017  . Ulnar neuropathy 04/18/2017  . GERD (gastroesophageal reflux disease) 10/19/2016  . Prediabetes 05/03/2016  . Cubital tunnel syndrome on right 03/01/2016  . Numbness and tingling of right arm 01/01/2016  . Chlamydia infection 01/27/2015  . Smoking 12/26/2013  . Chronic lower back pain 04/13/2013  . Sciatica 04/13/2013  . Chronic neck pain 04/13/2013  . Spondylosis of cervical spine 04/13/2013    PHYSICAL THERAPY DISCHARGE SUMMARY  Visits from Start of Care: 10  Current functional level related to goals / functional outcomes: See above   Remaining deficits: See above   Education / Equipment: Anatomy of condition, POC, HEP, exercise form/rationale  Plan: Patient agrees to discharge.  Patient goals were partially met. Patient is being discharged due to                                                     ?????    Need for transfer to pelvic floor specialist.  Gay Filler. Hightower PT, DPT 03/07/18 10:20 AM   [facility] [address] [place], [place], [address] Phone: [phone]   Fax:  [phone]  Name: Cheryl King MRN: [id number] Date of Birth: 12-29-1965

## 2018-03-09 ENCOUNTER — Ambulatory Visit: Payer: Medicaid Other | Admitting: Physical Therapy

## 2018-03-09 ENCOUNTER — Encounter: Payer: Self-pay | Admitting: Family Medicine

## 2018-03-09 ENCOUNTER — Ambulatory Visit: Payer: Medicaid Other | Attending: Family Medicine | Admitting: Family Medicine

## 2018-03-09 VITALS — BP 105/68 | HR 69 | Temp 98.1°F | Ht 67.0 in | Wt 147.2 lb

## 2018-03-09 DIAGNOSIS — M5442 Lumbago with sciatica, left side: Secondary | ICD-10-CM | POA: Diagnosis not present

## 2018-03-09 DIAGNOSIS — E7800 Pure hypercholesterolemia, unspecified: Secondary | ICD-10-CM | POA: Insufficient documentation

## 2018-03-09 DIAGNOSIS — R3989 Other symptoms and signs involving the genitourinary system: Secondary | ICD-10-CM | POA: Diagnosis not present

## 2018-03-09 DIAGNOSIS — K219 Gastro-esophageal reflux disease without esophagitis: Secondary | ICD-10-CM

## 2018-03-09 DIAGNOSIS — G8929 Other chronic pain: Secondary | ICD-10-CM | POA: Diagnosis not present

## 2018-03-09 DIAGNOSIS — Z880 Allergy status to penicillin: Secondary | ICD-10-CM | POA: Insufficient documentation

## 2018-03-09 DIAGNOSIS — R7303 Prediabetes: Secondary | ICD-10-CM | POA: Insufficient documentation

## 2018-03-09 DIAGNOSIS — Z79899 Other long term (current) drug therapy: Secondary | ICD-10-CM | POA: Diagnosis not present

## 2018-03-09 DIAGNOSIS — Z791 Long term (current) use of non-steroidal anti-inflammatories (NSAID): Secondary | ICD-10-CM | POA: Diagnosis not present

## 2018-03-09 DIAGNOSIS — M5441 Lumbago with sciatica, right side: Secondary | ICD-10-CM

## 2018-03-09 DIAGNOSIS — E785 Hyperlipidemia, unspecified: Secondary | ICD-10-CM | POA: Insufficient documentation

## 2018-03-09 DIAGNOSIS — R399 Unspecified symptoms and signs involving the genitourinary system: Secondary | ICD-10-CM

## 2018-03-09 DIAGNOSIS — Z7689 Persons encountering health services in other specified circumstances: Secondary | ICD-10-CM | POA: Diagnosis not present

## 2018-03-09 DIAGNOSIS — Z885 Allergy status to narcotic agent status: Secondary | ICD-10-CM | POA: Diagnosis not present

## 2018-03-09 DIAGNOSIS — M549 Dorsalgia, unspecified: Secondary | ICD-10-CM | POA: Diagnosis present

## 2018-03-09 LAB — POCT URINALYSIS DIP (CLINITEK)
Bilirubin, UA: NEGATIVE
Glucose, UA: NEGATIVE mg/dL
Ketones, POC UA: NEGATIVE mg/dL
Leukocytes, UA: NEGATIVE
NITRITE UA: NEGATIVE
Spec Grav, UA: 1.02 (ref 1.010–1.025)
Urobilinogen, UA: 0.2 E.U./dL
pH, UA: 6 (ref 5.0–8.0)

## 2018-03-09 LAB — POCT GLYCOSYLATED HEMOGLOBIN (HGB A1C): Hemoglobin A1C: 5.8 % — AB (ref 4.0–5.6)

## 2018-03-09 NOTE — Progress Notes (Signed)
Subjective:  Patient ID: Cheryl King, female    DOB: 1965/04/28  Age: 53 y.o. MRN: [id number]  CC: Back Pain and Urinary Tract Infection   HPI Cheryl King  is a 53 year old female with Medical history  significant for Prediabetes (diet controlled A1c 5.8) chronic low back pain with sciatica, GERD, hyperlipidemia, tobacco abuse here for a follow-up visit. She presents today for recheck of UTI, evaluation for pelvic floor rehab and her cholesterol.  Back Pain/Spondylosis of Cervical Spine/ Sciatica: Today she rates her pain a 6 out of 10. She takes flexeril as needed. She takes tramadol 50 mg , naproxen, cymbalta and lyrica when her back pain is unbearable.Her pain is managed on her current medication regimen.  Currently undergoing PT. She has a form from PT requiring signatures for referral for pelvic floor exercise.   She went to urgent care last month for complaints of dysuria. Urine culture was positive for e coli; she was treated with Macrobid. Denies dysuria, frequency, urgency and malodorous urine.    Pre Diabetes - HgbA1c today 5.8 03/09/2018 She endorses eating a lot of junk food and starchy foods; currently does not take medications.  Tobacco Abuse: She is not ready to quit smoking at this time. Counseled on benefits of smoking cessation.   Past Medical History:  Diagnosis Date  . Acid reflux   . Allergy   . Carpal tunnel syndrome   . Depression   . Diverticulitis   . Hyperlipidemia   . Low back pain   . Neck pain   . Sacroiliac inflammation ([place])   . Sciatica   . Seasonal allergies     Past Surgical History:  Procedure Laterality Date  . ABDOMINAL SURGERY    . ANKLE SURGERY     lt.  Marland Kitchen PARTIAL HYSTERECTOMY    . right breast cyst removed     . URETHRAL DIVERTICULUM REPAIR     uretrral diverticultis    Family History  Problem Relation Age of Onset  . Healthy Mother   . Other Father        Unsure of medical history  . Asthma Maternal Aunt     . Diabetes Maternal Aunt   . Cancer Maternal Aunt   . Breast cancer Maternal Aunt   . Hypertension Maternal Grandmother   . Colon cancer Neg Hx   . Pancreatic cancer Neg Hx   . Rectal cancer Neg Hx   . Stomach cancer Neg Hx   . Colon polyps Neg Hx   . Esophageal cancer Neg Hx     Allergies  Allergen Reactions  . Percocet [Oxycodone-Acetaminophen] Nausea Only  . Vicodin [Hydrocodone-Acetaminophen] Nausea Only  . Penicillins Rash    Outpatient Medications Prior to Visit  Medication Sig Dispense Refill  . acetaminophen-codeine (TYLENOL #3) 300-30 MG tablet TAKE 1 TABLET BY MOUTH EVERY 8 HOURS AS NEEDED FOR MODERATE PAIN. 30 tablet 0  . atorvastatin (LIPITOR) 20 MG tablet Take 1 tablet (20 mg total) by mouth daily. 90 tablet 1  . butalbital-acetaminophen-caffeine (FIORICET, ESGIC) 50-325-40 MG tablet Take 1 tablet by mouth every 12 (twelve) hours as needed for headache. 60 tablet 1  . cetirizine (ZYRTEC) 10 MG tablet Take 1 tablet (10 mg total) by mouth daily. 90 tablet 1  . cyclobenzaprine (FLEXERIL) 10 MG tablet TAKE 1 TABLET BY MOUTH 2 (TWO) TIMES DAILY AS NEEDED FOR MUSCLE SPASMS. 60 tablet 2  . diclofenac sodium (VOLTAREN) 1 % GEL Apply 2 g topically 4 (four) times daily. 5  Tube 2  . DULoxetine (CYMBALTA) 60 MG capsule Take 1 capsule (60 mg total) by mouth daily. 90 capsule 1  . fluticasone (FLONASE) 50 MCG/ACT nasal spray PLACE 2 SPRAYS INTO BOTH NOSTRILS DAILY. 16 g 2  . Lactobacillus CAPS Take 1 capsule by mouth daily.    . metroNIDAZOLE (METROGEL) 0.75 % vaginal gel Place 1 Applicatorful vaginally 2 (two) times daily. 70 g 0  . Multiple Vitamin (MULTIVITAMIN WITH MINERALS) TABS tablet Take 1 tablet by mouth daily.    . naproxen (NAPROSYN) 500 MG tablet TAKE 1 TABLET BY MOUTH 2 TIMES DAILY WITH A MEAL. 60 tablet 3  . nicotine (NICODERM CQ) 14 mg/24hr patch Place 1 patch (14 mg total) onto the skin daily. For 4 weeks then 7mg /24hr daily for  4 weeks 28 patch 1  . ondansetron  (ZOFRAN) 4 MG tablet TAKE 1 TABLET BY MOUTH EVERY 8 HOURS AS NEEDED FOR NAUSEA OR VOMITING. 60 tablet 1  . predniSONE (STERAPRED UNI-PAK 21 TAB) 10 MG (21) TBPK tablet Take by mouth daily. Take 6 tabs by mouth daily for 2 days, then 5 tabs for 2 days, then 4 tabs for 2 days, then 3 tabs for 2 days, 2 tabs for 2 days, then 1 tab by mouth daily for 2 days 42 tablet 0  . pregabalin (LYRICA) 50 MG capsule Take 1 capsule (50 mg total) by mouth at bedtime. 30 capsule 5  . RESTASIS 0.05 % ophthalmic emulsion   3  . traMADol (ULTRAM) 50 MG tablet Take by mouth every 6 (six) hours as needed.    . triamcinolone cream (KENALOG) 0.1 % Apply 1 application topically 2 (two) times daily. 45 g 1  . omeprazole (PRILOSEC) 20 MG capsule Take 1 capsule (20 mg total) by mouth daily. 90 capsule 1   Facility-Administered Medications Prior to Visit  Medication Dose Route Frequency Provider Last Rate Last Dose  . 0.9 %  sodium chloride infusion  500 mL Intravenous Once Nelida Meuse III, MD         ROS Review of Systems  Constitutional: Negative for appetite change, chills, fatigue and unexpected weight change.  Eyes: Negative for visual disturbance.  Respiratory: Negative for cough, shortness of breath and wheezing.   Cardiovascular: Negative for chest pain and palpitations.  Gastrointestinal: Positive for abdominal pain. Negative for abdominal distention.       She says it is related to physical therapy  Endocrine: Negative for polydipsia, polyphagia and polyuria.  Genitourinary: Negative for dysuria, flank pain, frequency and urgency.  Musculoskeletal: Positive for back pain and neck pain. Negative for gait problem.  Skin: Negative for pallor, rash and wound.  Neurological: Negative for weakness and numbness.    Objective:  BP 105/68   Pulse 69   Temp 98.1 F (36.7 C) (Oral)   Ht 5\' 7"  (1.702 m)   Wt 66.8 kg   SpO2 94%   BMI 23.05 kg/m   BP/Weight 03/09/2018 02/06/2018 35/57/3220  Systolic BP 254  270 623  Diastolic BP 68 70 72  Wt. (Lbs) 147.2 - 140  BMI 23.05 - 21.93      Physical Exam Vitals signs and nursing note reviewed.  Constitutional:      General: She is not in acute distress.    Appearance: Normal appearance. She is normal weight.  Neck:     Musculoskeletal: Normal range of motion and neck supple.  Cardiovascular:     Rate and Rhythm: Normal rate and regular rhythm.  Pulses: Normal pulses.     Heart sounds: Normal heart sounds. No murmur. No friction rub. No gallop.   Pulmonary:     Effort: Pulmonary effort is normal. No respiratory distress.     Breath sounds: Normal breath sounds. No wheezing or rhonchi.  Abdominal:     General: Abdomen is flat. Bowel sounds are normal. There is no distension.     Palpations: Abdomen is soft.     Tenderness: There is no abdominal tenderness. There is no right CVA tenderness or left CVA tenderness.  Musculoskeletal:        General: Tenderness (lumbar spine) present. No swelling.     Comments: Negative straight leg raise b/l Bilateral ankle tenderness   Skin:    General: Skin is warm and dry.     Capillary Refill: Capillary refill takes less than 2 seconds.  Neurological:     General: No focal deficit present.     Mental Status: She is alert and oriented to person, place, and time.     Motor: No weakness.     Coordination: Coordination normal.     Gait: Gait normal.     Deep Tendon Reflexes: Reflexes normal.  Psychiatric:        Mood and Affect: Mood normal.        Behavior: Behavior normal.        Thought Content: Thought content normal.        Judgment: Judgment normal.     CMP Latest Ref Rng & Units 04/18/2017 01/17/2017 05/03/2016  Glucose 65 - 99 mg/dL 86 116(H) 81  BUN 6 - 24 mg/dL 14 19 18   Creatinine 0.57 - 1.00 mg/dL 0.67 0.74 0.74  Sodium 134 - 144 mmol/L 140 141 143  Potassium 3.5 - 5.2 mmol/L 4.9 4.3 4.9  Chloride 96 - 106 mmol/L 102 103 103  CO2 20 - 29 mmol/L 27 25 25   Calcium 8.7 - 10.2 mg/dL  9.9 9.7 9.8  Total Protein 6.0 - 8.5 g/dL 7.0 6.9 -  Total Bilirubin 0.0 - 1.2 mg/dL 0.3 0.3 -  Alkaline Phos 39 - 117 IU/L 111 99 -  AST 0 - 40 IU/L 22 22 -  ALT 0 - 32 IU/L 19 17 -    Lipid Panel     Component Value Date/Time   CHOL 194 04/18/2017 1003   TRIG 80 04/18/2017 1003   HDL 58 04/18/2017 1003   CHOLHDL 3.3 04/18/2017 1003   CHOLHDL 3.2 12/26/2013 1319   VLDL 18 12/26/2013 1319   LDLCALC 120 (H) 04/18/2017 1003    CBC    Component Value Date/Time   WBC 8.6 04/18/2017 1003   WBC 9.3 04/13/2013 1152   RBC 4.74 04/18/2017 1003   RBC 4.94 04/13/2013 1152   HGB 14.9 04/18/2017 1003   HCT 44.7 04/18/2017 1003   PLT 236 04/18/2017 1003   MCV 94 04/18/2017 1003   MCH 31.4 04/18/2017 1003   MCH 32.2 04/13/2013 1152   MCHC 33.3 04/18/2017 1003   MCHC 34.6 04/13/2013 1152   RDW 14.5 04/18/2017 1003   LYMPHSABS 2.6 04/18/2017 1003   MONOABS 0.9 04/13/2013 1152   EOSABS 0.1 04/18/2017 1003   BASOSABS 0.0 04/18/2017 1003    Lab Results  Component Value Date   HGBA1C 5.8 (A) 03/09/2018    Assessment & Plan:   1. Prediabetes Uncontrolled -  (HgbA1C 03/09/2018 5.8) - Diet and lifestyle modifications discussed with patient. Patient instructed to increase physical activity as much as possible,  even her chronic pain issues.    2. UTI symptoms Resolved - Urinalysis negative for UTI,  It is likely that her abdominal pain is caused by her abdominal core exercises from physical therapy. - Importance of wiping front to back when using the restroom was discussed with patient.  - POCT URINALYSIS DIP (CLINITEK)  3. Chronic midline low back pain with bilateral sciatica Controlled - Continue current medication regimen.  - Continue physical therapy for strength training - Referral sent to Physical Therapy for Pelvic floor exercises.  4. Gastroesophageal reflux disease without esophagitis Asymptomatic - Discontinue omeprazole.    5. Pure hypercholesterolemia --  Order Lipid Panel. Continue Lipitor - Low fat diet discussed with patient. She verbalizes understanding.     No orders of the defined types were placed in this encounter.   Follow-up: Follow-Up in 3 months for chronic disease management.        [facility] [place] [facility], [place]   03/09/2018, 12:07 PM

## 2018-03-10 LAB — CMP14+EGFR
ALT: 10 IU/L (ref 0–32)
AST: 18 IU/L (ref 0–40)
Albumin/Globulin Ratio: 1.7 (ref 1.2–2.2)
Albumin: 4.5 g/dL (ref 3.8–4.9)
Alkaline Phosphatase: 118 IU/L — ABNORMAL HIGH (ref 39–117)
BUN/Creatinine Ratio: 26 — ABNORMAL HIGH (ref 9–23)
BUN: 18 mg/dL (ref 6–24)
Bilirubin Total: 0.4 mg/dL (ref 0.0–1.2)
CO2: 23 mmol/L (ref 20–29)
Calcium: 9.7 mg/dL (ref 8.7–10.2)
Chloride: 102 mmol/L (ref 96–106)
Creatinine, Ser: 0.69 mg/dL (ref 0.57–1.00)
GFR calc Af Amer: 114 mL/min/{1.73_m2} (ref >59)
GFR calc non Af Amer: 99 mL/min/{1.73_m2} (ref >59)
Globulin, Total: 2.6 g/dL (ref 1.5–4.5)
Glucose: 86 mg/dL (ref 65–99)
Potassium: 4.5 mmol/L (ref 3.5–5.2)
Sodium: 142 mmol/L (ref 134–144)
Total Protein: 7.1 g/dL (ref 6.0–8.5)

## 2018-03-10 LAB — LIPID PANEL
Chol/HDL Ratio: 3.3 ratio (ref 0.0–4.4)
Cholesterol, Total: 215 mg/dL — ABNORMAL HIGH (ref 100–199)
HDL: 66 mg/dL (ref >39)
LDL Calculated: 135 mg/dL — ABNORMAL HIGH (ref 0–99)
Triglycerides: 69 mg/dL (ref 0–149)
VLDL Cholesterol Cal: 14 mg/dL (ref 5–40)

## 2018-03-12 ENCOUNTER — Other Ambulatory Visit: Payer: Self-pay | Admitting: Family Medicine

## 2018-03-12 DIAGNOSIS — G8929 Other chronic pain: Secondary | ICD-10-CM

## 2018-03-12 DIAGNOSIS — M5442 Lumbago with sciatica, left side: Principal | ICD-10-CM

## 2018-03-13 ENCOUNTER — Telehealth: Payer: Self-pay

## 2018-03-13 DIAGNOSIS — M5441 Lumbago with sciatica, right side: Secondary | ICD-10-CM

## 2018-03-13 DIAGNOSIS — M5442 Lumbago with sciatica, left side: Secondary | ICD-10-CM

## 2018-03-13 DIAGNOSIS — G8929 Other chronic pain: Secondary | ICD-10-CM

## 2018-03-13 DIAGNOSIS — R102 Pelvic and perineal pain: Secondary | ICD-10-CM

## 2018-03-13 NOTE — Telephone Encounter (Signed)
Pt last seen: 03/09/18 Next appt: 06/19/18 Last RX written on: 07/18/17 Date of original fill: 07/27/17 Date of refill(s): 09/02/17, 10/03/17, 11/03/17, 01/01/18  The following controlled substances were also filled during this time: Tylenol #3 #21tbs/7ds written by Dr. Sharol Given on 08/23/17, filled 08/23/17.    Please refill if appropriate.

## 2018-03-13 NOTE — Telephone Encounter (Signed)
Patient needs a referral put into epic for PT. Paperwork that was faxed over did not have MD signature. I was informed that the diagnosis should have low back pain  and make sure to put pelvic pain as well.

## 2018-03-14 NOTE — Addendum Note (Signed)
Addended byCharlott Rakes on: 03/14/2018 02:06 PM   Modules accepted: Orders

## 2018-03-14 NOTE — Telephone Encounter (Signed)
Referral has been placed. 

## 2018-03-20 ENCOUNTER — Other Ambulatory Visit (INDEPENDENT_AMBULATORY_CARE_PROVIDER_SITE_OTHER): Payer: Self-pay | Admitting: Specialist

## 2018-03-20 MED FILL — CYCLOBENZAPRINE 10 MG TAB: 10 | 30 days supply | Qty: 60 | Fill #2

## 2018-03-20 MED FILL — NAPROXEN 500 MG TABLET: 500 | 30 days supply | Qty: 60 | Fill #0

## 2018-03-20 MED FILL — FLUTICASONE PROP 50 MCG SPR: 50 | 30 days supply | Qty: 16 | Fill #1

## 2018-03-20 NOTE — Telephone Encounter (Signed)
Naproxen refill request 

## 2018-03-22 ENCOUNTER — Ambulatory Visit: Payer: Medicaid Other | Admitting: Family Medicine

## 2018-03-23 ENCOUNTER — Encounter: Payer: Self-pay | Admitting: Physical Therapy

## 2018-03-23 ENCOUNTER — Ambulatory Visit: Payer: Medicaid Other | Attending: Family Medicine | Admitting: Physical Therapy

## 2018-03-23 ENCOUNTER — Other Ambulatory Visit: Payer: Self-pay

## 2018-03-23 DIAGNOSIS — R252 Cramp and spasm: Secondary | ICD-10-CM | POA: Insufficient documentation

## 2018-03-23 DIAGNOSIS — M5442 Lumbago with sciatica, left side: Secondary | ICD-10-CM | POA: Diagnosis not present

## 2018-03-23 DIAGNOSIS — G8929 Other chronic pain: Secondary | ICD-10-CM | POA: Insufficient documentation

## 2018-03-23 DIAGNOSIS — M5441 Lumbago with sciatica, right side: Secondary | ICD-10-CM | POA: Diagnosis not present

## 2018-03-23 DIAGNOSIS — M6281 Muscle weakness (generalized): Secondary | ICD-10-CM | POA: Diagnosis not present

## 2018-03-23 DIAGNOSIS — R279 Unspecified lack of coordination: Secondary | ICD-10-CM

## 2018-03-23 DIAGNOSIS — Z7689 Persons encountering health services in other specified circumstances: Secondary | ICD-10-CM | POA: Diagnosis not present

## 2018-03-23 NOTE — Patient Instructions (Signed)
Inform Yourself About Menopause  ? Osteoporosis Risk is higher after menopause 1 in 2 women and 1 in 5 men have osteoporosis in [place] and [place] is closing the gap (possibly due to more adopting more Western lifestyle?) Strategies to prevent: eat green leafy vegetables, fish, seaweeds, fermented soy, fermented foods, and stay hydrated; get exercise; get adequate sleep Avoid: sodas (caffeine, non-caffeine, diet, or regular) - studies show increased risk for hip fracture  ? SUI (stress urinary incontinence)  Risk factors: obesity, lack of exercise, constipation, hypertension Half of women ages 48-90 experience leakage monthly  ? LUTS (lower urinary tract symptoms)  Regular sex decreases LUTS These symptoms are 3-6x more likely in women not sexually active verses women who are  ? Sexual Pain Possible causes: post surgery (such as hysterectomy, pelvic floor muscle dysfunction, muscle and skeletal dysfunction of low back, trunk, hips, vaginal dryness, previous trauma, anticipation of pain can cause pain  ? Estrogen Almost every cell in the human body has estrogen receptors Tissues hold more water when estrogen present Low estrogen can deplete good bacteria causing pH issues in the vagina Low estrogen can cause tissues and muscle to atrophy Lower estrogen can occur in Menopause also post-partum Estrogen dominance causes constipation Xenoestrogens: come from environment and are similar to estrogens but have negative effect on the body include artificial fragrances, DDT, PCB, plastics (things like Teflon pans have some of these) Phytoestrogens: plant based estrogens such as soy, fermented soy, lentils, and chickpeas  ? Testosterone Estrogen is not the only hormone that decreases - women SOMETIMES lose testosterone too (sometimes there is an excess)  Testosterone may be low if there is low sex drive, decrease in muscle mass, decrease in bone density, depression, achy joints, chronic fatigue,  dry skin, incontinence  Things that help increase testosterone: Exercise and eat foods with Zinc (oysters, crab, lean beef and poultry, dairy (yogurt, cheese), nuts, beans, chickpeas, cashews, almonds   ? Bowel function Fecal incontinence more prevalent in menopausal women Increased risk in women who have experienced complicated deliveries (both vaginal and c-section) and other risk factors: urinary incontinence, obesity, smoking, chronic diarrhea, IBS, cholecystectomy, anal penetrative intercourse Stress: can cause nausea, vomiting, abdominal pain, bowel issues ? Pelvic organ prolapse (POP) With or without mesh, conservative treatment is recommended first! Systematic review of the recent literature VanGeelen and Dwyer 'Conservative treatment is the first option in women with POP.  ? Posture Improved posture is better for joints and overall function.  Study shows that upright posture improves testosterone production in men.  Posture also shown to improve mood and self-confidence across all genders.  #Menopause is a NORMAL process; NOT a disease process Studies show in cultures that view aging as something to be revered, there are much fewer if any negative symptoms experienced during menopause! Big picture is that symptoms are reduced with greater overall health: Hydration, sleep, take time for deep breathing and having fun, practice things that bring joy and gratitude, exercise and move in ways that feel good for your body  [facility] [address], [place], [place] [address] Phone # [phone] Fax [phone]

## 2018-03-24 NOTE — Therapy (Signed)
[facility] [address], [place], [place], [address] Phone: [phone]   Fax:  [phone]  Physical Therapy Evaluation  Patient Details  Name: Cheryl King MRN: [id number] Date of Birth: [birth year] Referring Provider (PT): Charlott Rakes, MD   Encounter Date: 03/23/2018  PT End of Session - 03/26/18 2042    Visit Number  1    Date for PT Re-Evaluation  06/16/18    Authorization Type  medicaid    Authorization - Visit Number  0    Authorization - Number of Visits  3    PT Start Time  7782    PT Stop Time  1228    PT Time Calculation (min)  40 min    Activity Tolerance  Patient tolerated treatment well    Behavior During Therapy  [facility] for tasks assessed/performed       Past Medical History:  Diagnosis Date  . Acid reflux   . Allergy   . Carpal tunnel syndrome   . Depression   . Diverticulitis   . Hyperlipidemia   . Low back pain   . Neck pain   . Sacroiliac inflammation ([place])   . Sciatica   . Seasonal allergies     Past Surgical History:  Procedure Laterality Date  . ABDOMINAL SURGERY    . ANKLE SURGERY     lt.  Marland Kitchen PARTIAL HYSTERECTOMY    . right breast cyst removed     . URETHRAL DIVERTICULUM REPAIR     uretrral diverticultis    There were no vitals filed for this visit.     Subjective Assessment - 03/23/18 1158    Subjective  Pt states she had back pain after incourse one time, overall just chronic back pain.  Has urinary leakage    Pertinent History  urethra surgery 10+    Patient Stated Goals  less leakage; decrease pain    Currently in Pain?  Yes    Pain Score  6     Pain Location  Back    Pain Orientation  Lower    Pain Descriptors / Indicators  Sore    Pain Type  Chronic pain    Pain Onset  More than a month ago    Pain Frequency  Constant    Aggravating Factors   constant    Pain Relieving Factors  massage some    Multiple Pain Sites  No          [facility] PT Assessment  - 03/26/18 0001      Assessment   Medical Diagnosis  R10.2 (ICD-10-CM) - Pelvic pain;M54.41,M54.42,G89.29 (ICD-10-CM) - Chronic midline low back pain with bilateral sciatica    Referring Provider (PT)  Charlott Rakes, MD      Precautions   Precautions  None      Restrictions   Weight Bearing Restrictions  No      Balance Screen   Has the patient fallen in the past 6 months  No      [place] residence    Living Arrangements  Alone      Prior Function   Level of Independence  Independent    Leisure  stretches, house stuff, sitting      Cognition   Overall Cognitive Status  Within Functional Limits for tasks assessed      Posture/Postural Control   Posture/Postural Control  Postural limitations    Postural Limitations  Decreased lumbar lordosis;Decreased thoracic kyphosis;Anterior  pelvic tilt      ROM / Strength   AROM / PROM / Strength  PROM;Strength      AROM   Lumbar - Right Side Bend  50% bil glut pain    Lumbar - Left Side Bend  50% bil glut pain      Strength   Right Hip Flexion  4+/5    Right Hip Extension  5/5    Right Hip ABduction  4+/5    Left Hip Flexion  4+/5    Left Hip Extension  5/5    Left Hip ABduction  5/5      Flexibility   Soft Tissue Assessment /Muscle Length  yes    Hamstrings  100%      Palpation   Palpation comment  Rt back and Lt hip sore with a lot of pressure      Special Tests    Special Tests  Lumbar    Lumbar Tests  Straight Leg Raise      Straight Leg Raise   Findings  Negative                Objective measurements completed on examination: See above findings.    Pelvic Floor Special Questions - 03/26/18 0001    Prior Pelvic/Prostate Exam  Yes    Date of Last Pelvic/Prostate Exam  --   hysterectomy many years ago   Are you Pregnant or attempting pregnancy?  No    Prior Pregnancies  No    Currently Sexually Active  Yes    Marinoff Scale  discomfort that does not affect  completion    Urinary Leakage  Yes    Activities that cause leaking  Sneezing;Coughing    Urinary urgency  No   hard to void   Urinary frequency  Not usually    Fecal incontinence  No    Falling out feeling (prolapse)  No    Skin Integrity  Intact    Perineal Body/Introitus   Elevated    Prolapse  None    Pelvic Floor Internal Exam  pt informed and identity confirmed for informed consent given to perform internal soft tissue assess and treat    Exam Type  Vaginal    Palpation  tight OI Rt>Lt, tight muslces throughout    Strength  fair squeeze, definite lift    Strength # of seconds  7    Tone  high               PT Education - 03/26/18 2055    Education Details  discussed effects of menopause       PT Short Term Goals - 02/23/18 1009      PT SHORT TERM GOAL #1   Title  Pt will be able to demonstrate proper core contraction in supine, seated and standing postures    Baseline  began educating at eval    Time  5    Period  Weeks    Status  On-going      PT SHORT TERM GOAL #2   Title  Pt will be independent in short term HEP as it has been established at target date    Baseline  began establishing at eval    Status  Achieved      PT SHORT TERM GOAL #3   Title  pt will decreased neck pain to <5/10 to assist with functional exercise 05/27/2014    Time  4    Period  Weeks  Status  Achieved      PT SHORT TERM GOAL #4   Title  pt will increase R shoulder flexion/abduction to > 4-/5 to assist with ADL's 05/27/2014    Status  On-going      PT SHORT TERM GOAL #5   Title  (low back) pt will decrease low back pain to < 5/10 during and following standing and walking for > 5-10 min to assist with increased funcitonal endurance ( 07/17/2014)    Status  On-going        PT Long Term Goals - 03/26/18 2049      PT LONG TERM GOAL #1   Title  Pt will report 50% less urinary leakage    Baseline  no change - eval only     Time  8    Period  Weeks    Status  New    Target  Date  05/18/18      PT LONG TERM GOAL #2   Title  pt will be able to sustain pelvic floor contraction for at least 20 seconds in order to having reduced urinary leakage throughout a typical day    Baseline  7 seconds    Time  8    Period  Weeks    Status  New    Target Date  05/18/18      PT LONG TERM GOAL #3   Title  pt will be ind with advanced HEP for maintaining improved core and pelvic floor strength    Baseline  does not know    Time  8    Period  Weeks    Status  New    Target Date  05/18/18      PT LONG TERM GOAL #4   Title  Pt will report 50% reduction in Rt hip pain    Baseline  no change - eval only today    Time  8    Period  Weeks    Status  New    Target Date  05/18/18      PT LONG TERM GOAL #5   Title  Pt will be able to stand/walk for at least 40 minutes    Baseline  20 minutes    Time  8    Period  Weeks    Status  New    Target Date  05/18/18             Plan - 03/26/18 2043    Clinical Impression Statement  Patient presents to clinic due to pelvic pain in lower abdominal region. She has chronic hip pain that she believes to be bursitis as well as chronic low back pain.  She has been having urinary leakage occasionally.  Pt demonstrates decreased lumbar ROM.  She has decreased hip IR, full ROM in hips otherwise without pain.  Pt has high tone pelvic floor and tight obdurator internus Lt>Rt.  Pt has fascial restriction T8-lumbar spine.  She demonstrates impaired pelvic floor strength with endurance of 7 seconds.  She will benefit from skilled PT to address her impairments and assist in pain management so she can return to maximum level of functional activities.    Personal Factors and Comorbidities  Age;Comorbidity 2;Transportation    Comorbidities  chronic pain of multiple regions, hysterectomy and urethral surgery    Examination-Activity Limitations  Other;Hygiene/Grooming   pain with intercourse   Examination-Participation Restrictions  Community  Activity;Other    Stability/Clinical Decision Making  Evolving/Moderate complexity  Clinical Decision Making  Moderate    Rehab Potential  Good    PT Frequency  2x / week    PT Duration  8 weeks    PT Treatment/Interventions  ADLs/Self Care Home Management;Cryotherapy;Electrical Stimulation;Ultrasound;Traction;Moist Heat;Therapeutic activities;Therapeutic exercise;Manual techniques;Patient/family education;Passive range of motion;Dry needling;Taping;Spinal Manipulations;Joint Manipulations;Biofeedback    PT Next Visit Plan  internal STM, lumbar ROM and hip IR ROM    Consulted and Agree with Plan of Care  Patient       Patient will benefit from skilled therapeutic intervention in order to improve the following deficits and impairments:  Difficulty walking, Increased muscle spasms, Decreased activity tolerance, Hypermobility, Pain, Postural dysfunction, Decreased strength, Decreased coordination  Visit Diagnosis: Chronic bilateral low back pain with bilateral sciatica - Plan: PT plan of care cert/re-cert  Muscle weakness (generalized) - Plan: PT plan of care cert/re-cert  Unspecified lack of coordination - Plan: PT plan of care cert/re-cert  Cramp and spasm - Plan: PT plan of care cert/re-cert     Problem List Patient Active Problem List   Diagnosis Date Noted  . Hyperlipidemia 07/18/2017  . Ulnar neuropathy 04/18/2017  . GERD (gastroesophageal reflux disease) 10/19/2016  . Prediabetes 05/03/2016  . Cubital tunnel syndrome on right 03/01/2016  . Numbness and tingling of right arm 01/01/2016  . Chlamydia infection 01/27/2015  . Smoking 12/26/2013  . Chronic lower back pain 04/13/2013  . Sciatica 04/13/2013  . Chronic neck pain 04/13/2013  . Spondylosis of cervical spine 04/13/2013    Jule Ser, PT 03/26/2018, 9:06 PM  [facility] [address] [place], [place], [address] Phone: [phone]   Fax:   [phone]  Name: Cheryl King MRN: [id number] Date of Birth: [birth year]

## 2018-03-28 DIAGNOSIS — Z7689 Persons encountering health services in other specified circumstances: Secondary | ICD-10-CM | POA: Diagnosis not present

## 2018-03-30 ENCOUNTER — Other Ambulatory Visit: Payer: Self-pay | Admitting: Family Medicine

## 2018-03-30 DIAGNOSIS — K219 Gastro-esophageal reflux disease without esophagitis: Secondary | ICD-10-CM

## 2018-04-06 DIAGNOSIS — Z7689 Persons encountering health services in other specified circumstances: Secondary | ICD-10-CM | POA: Diagnosis not present

## 2018-04-14 ENCOUNTER — Ambulatory Visit: Payer: Medicaid Other | Admitting: Physical Therapy

## 2018-04-16 ENCOUNTER — Other Ambulatory Visit: Payer: Self-pay | Admitting: Family Medicine

## 2018-04-16 DIAGNOSIS — G8929 Other chronic pain: Secondary | ICD-10-CM

## 2018-04-16 DIAGNOSIS — M5442 Lumbago with sciatica, left side: Principal | ICD-10-CM

## 2018-04-18 NOTE — Telephone Encounter (Signed)
Pt last seen: 03/09/18 Next appt: 06/19/18 Last RX written on: 03/14/18 Date of original fill: 03/14/18 Date of refill(s): n/a  No other controlled substances were filled during this time, please refill if appropriate.

## 2018-04-21 ENCOUNTER — Encounter: Payer: Medicaid Other | Admitting: Physical Therapy

## 2018-04-26 ENCOUNTER — Telehealth: Payer: Self-pay | Admitting: Physical Therapy

## 2018-04-26 NOTE — Telephone Encounter (Signed)
Patient was called and asked about telehealth.  Patient is doing okay with home exercises and not interested in telehealth.  She would like to return to PT when clinic re-opens.  Gustavus Bryant, PT 04/26/18 3:41 PM

## 2018-04-27 ENCOUNTER — Encounter: Payer: Medicaid Other | Admitting: Physical Therapy

## 2018-04-27 ENCOUNTER — Other Ambulatory Visit: Payer: Self-pay | Admitting: Family Medicine

## 2018-04-27 DIAGNOSIS — M5441 Lumbago with sciatica, right side: Principal | ICD-10-CM

## 2018-04-27 DIAGNOSIS — G8929 Other chronic pain: Secondary | ICD-10-CM

## 2018-04-27 DIAGNOSIS — E7800 Pure hypercholesterolemia, unspecified: Secondary | ICD-10-CM

## 2018-04-27 DIAGNOSIS — M5442 Lumbago with sciatica, left side: Principal | ICD-10-CM

## 2018-04-27 MED FILL — FLUTICASONE PROP 50 MCG SPR: 50 | 30 days supply | Qty: 16 | Fill #2

## 2018-04-27 MED FILL — CYCLOBENZAPRINE 10 MG TAB: 10 | 30 days supply | Qty: 60 | Fill #0

## 2018-04-27 MED FILL — NAPROXEN 500 MG TABLET: 500 | 30 days supply | Qty: 60 | Fill #1

## 2018-04-27 MED FILL — ATORVASTATIN 20 MG TABLET: 20 | 90 days supply | Qty: 90 | Fill #0

## 2018-04-28 ENCOUNTER — Other Ambulatory Visit: Payer: Self-pay | Admitting: Family Medicine

## 2018-04-28 ENCOUNTER — Other Ambulatory Visit (INDEPENDENT_AMBULATORY_CARE_PROVIDER_SITE_OTHER): Payer: Self-pay | Admitting: Orthopedic Surgery

## 2018-04-28 DIAGNOSIS — M5136 Other intervertebral disc degeneration, lumbar region: Secondary | ICD-10-CM

## 2018-04-28 DIAGNOSIS — M542 Cervicalgia: Secondary | ICD-10-CM

## 2018-04-28 DIAGNOSIS — E7800 Pure hypercholesterolemia, unspecified: Secondary | ICD-10-CM

## 2018-05-01 ENCOUNTER — Other Ambulatory Visit (INDEPENDENT_AMBULATORY_CARE_PROVIDER_SITE_OTHER): Payer: Self-pay | Admitting: Specialist

## 2018-05-01 MED ORDER — ACETAMINOPHEN-CODEINE #3 300-30 MG PO TABS: 1.0000 | ORAL_TABLET | Freq: Three times a day (TID) | ORAL | 0 refills | Status: DC | PRN

## 2018-05-01 MED FILL — ACETAMINOPHEN/COD #3 TABLET: 300-30 | 7 days supply | Qty: 21 | Fill #0

## 2018-05-01 NOTE — Telephone Encounter (Signed)
Don't know if you want to refill this

## 2018-05-01 NOTE — Telephone Encounter (Signed)
Refill request

## 2018-05-31 MED FILL — CYCLOBENZAPRINE 10 MG TAB: 10 | 30 days supply | Qty: 60 | Fill #1

## 2018-05-31 MED FILL — NAPROXEN 500 MG TABLET: 500 | 30 days supply | Qty: 60 | Fill #2

## 2018-06-02 ENCOUNTER — Other Ambulatory Visit (INDEPENDENT_AMBULATORY_CARE_PROVIDER_SITE_OTHER): Payer: Self-pay | Admitting: Specialist

## 2018-06-02 DIAGNOSIS — M5136 Other intervertebral disc degeneration, lumbar region: Secondary | ICD-10-CM

## 2018-06-02 DIAGNOSIS — M542 Cervicalgia: Secondary | ICD-10-CM

## 2018-06-02 MED FILL — ACETAMINOPHEN/COD #3 TABLET: 300-30 | 7 days supply | Qty: 21 | Fill #0

## 2018-06-02 NOTE — Telephone Encounter (Signed)
Tylenol #3 refill request

## 2018-06-08 ENCOUNTER — Other Ambulatory Visit: Payer: Self-pay | Admitting: Family Medicine

## 2018-06-08 DIAGNOSIS — G8929 Other chronic pain: Secondary | ICD-10-CM

## 2018-06-08 MED FILL — DULoxetine HCL 60 MG CPEP: 60 | 30 days supply | Qty: 30 | Fill #0

## 2018-06-19 ENCOUNTER — Ambulatory Visit: Payer: Medicaid Other | Attending: Family Medicine | Admitting: Family Medicine

## 2018-06-19 ENCOUNTER — Other Ambulatory Visit: Payer: Self-pay

## 2018-06-19 ENCOUNTER — Encounter: Payer: Self-pay | Admitting: Family Medicine

## 2018-06-19 VITALS — BP 102/66 | HR 64 | Temp 97.9°F | Ht 67.0 in | Wt 140.2 lb

## 2018-06-19 DIAGNOSIS — Z880 Allergy status to penicillin: Secondary | ICD-10-CM | POA: Diagnosis not present

## 2018-06-19 DIAGNOSIS — Z90711 Acquired absence of uterus with remaining cervical stump: Secondary | ICD-10-CM | POA: Diagnosis not present

## 2018-06-19 DIAGNOSIS — Z791 Long term (current) use of non-steroidal anti-inflammatories (NSAID): Secondary | ICD-10-CM | POA: Diagnosis not present

## 2018-06-19 DIAGNOSIS — M5441 Lumbago with sciatica, right side: Secondary | ICD-10-CM | POA: Diagnosis not present

## 2018-06-19 DIAGNOSIS — R0982 Postnasal drip: Secondary | ICD-10-CM

## 2018-06-19 DIAGNOSIS — R51 Headache: Secondary | ICD-10-CM | POA: Insufficient documentation

## 2018-06-19 DIAGNOSIS — F17200 Nicotine dependence, unspecified, uncomplicated: Secondary | ICD-10-CM

## 2018-06-19 DIAGNOSIS — M5442 Lumbago with sciatica, left side: Secondary | ICD-10-CM | POA: Diagnosis not present

## 2018-06-19 DIAGNOSIS — Z833 Family history of diabetes mellitus: Secondary | ICD-10-CM | POA: Diagnosis not present

## 2018-06-19 DIAGNOSIS — Z7951 Long term (current) use of inhaled steroids: Secondary | ICD-10-CM | POA: Diagnosis not present

## 2018-06-19 DIAGNOSIS — E785 Hyperlipidemia, unspecified: Secondary | ICD-10-CM | POA: Insufficient documentation

## 2018-06-19 DIAGNOSIS — R7303 Prediabetes: Secondary | ICD-10-CM | POA: Diagnosis not present

## 2018-06-19 DIAGNOSIS — G8929 Other chronic pain: Secondary | ICD-10-CM | POA: Insufficient documentation

## 2018-06-19 DIAGNOSIS — Z803 Family history of malignant neoplasm of breast: Secondary | ICD-10-CM | POA: Diagnosis not present

## 2018-06-19 DIAGNOSIS — G44229 Chronic tension-type headache, not intractable: Secondary | ICD-10-CM

## 2018-06-19 DIAGNOSIS — F17210 Nicotine dependence, cigarettes, uncomplicated: Secondary | ICD-10-CM | POA: Insufficient documentation

## 2018-06-19 DIAGNOSIS — Z885 Allergy status to narcotic agent status: Secondary | ICD-10-CM | POA: Insufficient documentation

## 2018-06-19 DIAGNOSIS — E7800 Pure hypercholesterolemia, unspecified: Secondary | ICD-10-CM | POA: Insufficient documentation

## 2018-06-19 DIAGNOSIS — Z79899 Other long term (current) drug therapy: Secondary | ICD-10-CM | POA: Insufficient documentation

## 2018-06-19 MED ORDER — ATORVASTATIN CALCIUM 20 MG PO TABS: 20.0000 mg | ORAL_TABLET | Freq: Every day | ORAL | 0 refills | Status: DC

## 2018-06-19 MED ORDER — NICOTINE 14 MG/24HR TD PT24: 14.0000 mg | MEDICATED_PATCH | Freq: Every day | TRANSDERMAL | 1 refills | Status: DC

## 2018-06-19 MED ORDER — BUTALBITAL-APAP-CAFFEINE 50-325-40 MG PO TABS: 1.0000 | ORAL_TABLET | Freq: Two times a day (BID) | ORAL | 1 refills | Status: DC | PRN

## 2018-06-19 MED ORDER — DULOXETINE HCL 60 MG PO CPEP: 60.0000 mg | ORAL_CAPSULE | Freq: Every day | ORAL | 2 refills | Status: DC

## 2018-06-19 MED ORDER — FLUTICASONE PROPIONATE 50 MCG/ACT NA SUSP: 2.0000 | Freq: Every day | NASAL | 2 refills | Status: DC

## 2018-06-19 MED FILL — BUTALB-ACETAMIN-CAFF 50-325: 50-325-40 | 15 days supply | Qty: 30 | Fill #0

## 2018-06-19 MED FILL — NICOTINE 14 MG/24HR PATCH: 14 | 28 days supply | Qty: 28 | Fill #0

## 2018-06-19 MED FILL — FLUTICASONE PROP 50 MCG SPR: 50 | 30 days supply | Qty: 16 | Fill #0

## 2018-06-19 NOTE — Patient Instructions (Signed)

## 2018-06-19 NOTE — Progress Notes (Signed)
Subjective:  Patient ID: Cheryl King, female    DOB: May 13, 1965  Age: 53 y.o. MRN: [id number]  CC: Back Pain   HPI Cheryl King is 53 year old female with Medical history  significant for Prediabetes (diet controlled A1c 5.8) chronic low back pain with sciatica, GERD, hyperlipidemia tobacco abuse here for a follow-up visit.  Her physical therapy for her back has been on hold due to ongoing COVID-19 pandemic however she has been compliant with exercises at home and is using Cymbalta and Lyrica for her pain.  Denies numbness or tingling in her lower extremities with no recent falls, no loss of sphincteric function Doing well on a statin with no complaints of myalgias. With regards to tobacco abuse she continually smokes half a pack of cigarettes per day and is willing to receive nicotine patches to assist in quitting.  She previously had headaches for which she was on Fioricet and is requesting refills at this time.  Headaches are sporadic but sometimes last a whole week and have no trigger.  These are not associated with photophobia, phonophobia, nausea or vomiting.  Past Medical History:  Diagnosis Date  . Acid reflux   . Allergy   . Carpal tunnel syndrome   . Depression   . Diverticulitis   . Hyperlipidemia   . Low back pain   . Neck pain   . Sacroiliac inflammation ([place])   . Sciatica   . Seasonal allergies     Past Surgical History:  Procedure Laterality Date  . ABDOMINAL SURGERY    . ANKLE SURGERY     lt.  Marland Kitchen PARTIAL HYSTERECTOMY    . right breast cyst removed     . URETHRAL DIVERTICULUM REPAIR     uretrral diverticultis    Family History  Problem Relation Age of Onset  . Healthy Mother   . Other Father        Unsure of medical history  . Asthma Maternal Aunt   . Diabetes Maternal Aunt   . Cancer Maternal Aunt   . Breast cancer Maternal Aunt   . Hypertension Maternal Grandmother   . Colon cancer Neg Hx   . Pancreatic cancer Neg Hx   . Rectal  cancer Neg Hx   . Stomach cancer Neg Hx   . Colon polyps Neg Hx   . Esophageal cancer Neg Hx     Allergies  Allergen Reactions  . Percocet [Oxycodone-Acetaminophen] Nausea Only  . Vicodin [Hydrocodone-Acetaminophen] Nausea Only  . Penicillins Rash    Outpatient Medications Prior to Visit  Medication Sig Dispense Refill  . cetirizine (ZYRTEC) 10 MG tablet Take 1 tablet (10 mg total) by mouth daily. 90 tablet 1  . cyclobenzaprine (FLEXERIL) 10 MG tablet TAKE 1 TABLET BY MOUTH TWO TIMES DAILY AS NEEDED FOR MUSCLE SPASMS. 60 tablet 2  . diclofenac sodium (VOLTAREN) 1 % GEL Apply 2 g topically 4 (four) times daily. 5 Tube 2  . Lactobacillus CAPS Take 1 capsule by mouth daily.    . Multiple Vitamin (MULTIVITAMIN WITH MINERALS) TABS tablet Take 1 tablet by mouth daily.    . naproxen (NAPROSYN) 500 MG tablet TAKE 1 TABLET BY MOUTH 2 TIMES DAILY WITH A MEAL. 60 tablet 3  . ondansetron (ZOFRAN) 4 MG tablet TAKE 1 TABLET BY MOUTH EVERY 8 HOURS AS NEEDED FOR NAUSEA OR VOMITING. 60 tablet 1  . pregabalin (LYRICA) 50 MG capsule TAKE 1 CAPSULE BY MOUTH EVERYDAY AT BEDTIME 30 capsule 2  . RESTASIS 0.05 % ophthalmic emulsion  3  . traMADol (ULTRAM) 50 MG tablet Take by mouth every 6 (six) hours as needed.    . triamcinolone cream (KENALOG) 0.1 % Apply 1 application topically 2 (two) times daily. 45 g 1  . atorvastatin (LIPITOR) 20 MG tablet TAKE 1 TABLET (20 MG TOTAL) BY MOUTH DAILY. 90 tablet 0  . DULoxetine (CYMBALTA) 60 MG capsule TAKE 1 CAPSULE (60 MG TOTAL) BY MOUTH DAILY. 30 capsule 2  . fluticasone (FLONASE) 50 MCG/ACT nasal spray PLACE 2 SPRAYS INTO BOTH NOSTRILS DAILY. 16 g 2  . nicotine (NICODERM CQ) 14 mg/24hr patch Place 1 patch (14 mg total) onto the skin daily. For 4 weeks then 7mg /24hr daily for  4 weeks 28 patch 1  . metroNIDAZOLE (METROGEL) 0.75 % vaginal gel Place 1 Applicatorful vaginally 2 (two) times daily. (Patient not taking: Reported on 06/19/2018) 70 g 0  . predniSONE (STERAPRED  UNI-PAK 21 TAB) 10 MG (21) TBPK tablet Take by mouth daily. Take 6 tabs by mouth daily for 2 days, then 5 tabs for 2 days, then 4 tabs for 2 days, then 3 tabs for 2 days, 2 tabs for 2 days, then 1 tab by mouth daily for 2 days (Patient not taking: Reported on 06/19/2018) 42 tablet 0   Facility-Administered Medications Prior to Visit  Medication Dose Route Frequency Provider Last Rate Last Dose  . 0.9 %  sodium chloride infusion  500 mL Intravenous Once Nelida Meuse III, MD         ROS Review of Systems  Constitutional: Negative for activity change, appetite change and fatigue.  HENT: Negative for congestion, sinus pressure and sore throat.   Eyes: Negative for visual disturbance.  Respiratory: Negative for cough, chest tightness, shortness of breath and wheezing.   Cardiovascular: Negative for chest pain and palpitations.  Gastrointestinal: Negative for abdominal distention, abdominal pain and constipation.  Endocrine: Negative for polydipsia.  Genitourinary: Negative for dysuria and frequency.  Musculoskeletal: Positive for back pain. Negative for arthralgias.  Skin: Negative for rash.  Neurological: Negative for tremors, light-headedness and numbness.  Hematological: Does not bruise/bleed easily.  Psychiatric/Behavioral: Negative for agitation and behavioral problems.    Objective:  BP 102/66   Pulse 64   Temp 97.9 F (36.6 C) (Oral)   Ht 5\' 7"  (1.702 m)   Wt 140 lb 3.2 oz (63.6 kg)   SpO2 97%   BMI 21.96 kg/m   BP/Weight 06/19/2018 03/09/2018 1/44/3154  Systolic BP 008 676 195  Diastolic BP 66 68 70  Wt. (Lbs) 140.2 147.2 -  BMI 21.96 23.05 -      Physical Exam Constitutional:      Appearance: She is well-developed.  Cardiovascular:     Rate and Rhythm: Normal rate.     Heart sounds: Normal heart sounds. No murmur.  Pulmonary:     Effort: Pulmonary effort is normal.     Breath sounds: Normal breath sounds. No wheezing or rales.  Chest:     Chest wall: No  tenderness.  Abdominal:     General: Bowel sounds are normal. There is no distension.     Palpations: Abdomen is soft. There is no mass.     Tenderness: There is no abdominal tenderness.  Musculoskeletal:        General: Tenderness (Left lower back) present.     Comments: Negative straight leg b/l  Neurological:     General: No focal deficit present.     Mental Status: She is alert and oriented to  person, place, and time.     CMP Latest Ref Rng & Units 03/09/2018 04/18/2017 01/17/2017  Glucose 65 - 99 mg/dL 86 86 116(H)  BUN 6 - 24 mg/dL 18 14 19   Creatinine 0.57 - 1.00 mg/dL 0.69 0.67 0.74  Sodium 134 - 144 mmol/L 142 140 141  Potassium 3.5 - 5.2 mmol/L 4.5 4.9 4.3  Chloride 96 - 106 mmol/L 102 102 103  CO2 20 - 29 mmol/L 23 27 25   Calcium 8.7 - 10.2 mg/dL 9.7 9.9 9.7  Total Protein 6.0 - 8.5 g/dL 7.1 7.0 6.9  Total Bilirubin 0.0 - 1.2 mg/dL 0.4 0.3 0.3  Alkaline Phos 39 - 117 IU/L 118(H) 111 99  AST 0 - 40 IU/L 18 22 22   ALT 0 - 32 IU/L 10 19 17     Lipid Panel     Component Value Date/Time   CHOL 215 (H) 03/09/2018 1223   TRIG 69 03/09/2018 1223   HDL 66 03/09/2018 1223   CHOLHDL 3.3 03/09/2018 1223   CHOLHDL 3.2 12/26/2013 1319   VLDL 18 12/26/2013 1319   LDLCALC 135 (H) 03/09/2018 1223    CBC    Component Value Date/Time   WBC 8.6 04/18/2017 1003   WBC 9.3 04/13/2013 1152   RBC 4.74 04/18/2017 1003   RBC 4.94 04/13/2013 1152   HGB 14.9 04/18/2017 1003   HCT 44.7 04/18/2017 1003   PLT 236 04/18/2017 1003   MCV 94 04/18/2017 1003   MCH 31.4 04/18/2017 1003   MCH 32.2 04/13/2013 1152   MCHC 33.3 04/18/2017 1003   MCHC 34.6 04/13/2013 1152   RDW 14.5 04/18/2017 1003   LYMPHSABS 2.6 04/18/2017 1003   MONOABS 0.9 04/13/2013 1152   EOSABS 0.1 04/18/2017 1003   BASOSABS 0.0 04/18/2017 1003    Lab Results  Component Value Date   HGBA1C 5.8 (A) 03/09/2018    Assessment & Plan:   1. Smoking Smoking cessation support: smoking cessation hotline:  1-800-QUIT-NOW.  Smoking cessation classes are available through [facility]. Call [phone] or visit our website at [email or web address]  Spent 3 minutes counseling on dangers of tobacco use and benefits of quitting and patient is ready to quit.  - nicotine (NICODERM CQ) 14 mg/24hr patch; Place 1 patch (14 mg total) onto the skin daily. For 4 weeks then 7mg /24hr daily for  4 weeks  Dispense: 28 patch; Refill: 1  2. Post-nasal drip Stable - fluticasone (FLONASE) 50 MCG/ACT nasal spray; Place 2 sprays into both nostrils daily.  Dispense: 16 g; Refill: 2  3. Pure hypercholesterolemia Controlled - atorvastatin (LIPITOR) 20 MG tablet; Take 1 tablet (20 mg total) by mouth daily.  Dispense: 90 tablet; Refill: 0  4. Chronic midline low back pain with bilateral sciatica Uncontrolled PT on hold due to ongoing COVID-19 pandemic She has been performing exercises at home - DULoxetine (CYMBALTA) 60 MG capsule; Take 1 capsule (60 mg total) by mouth daily.  Dispense: 30 capsule; Refill: 2  5. Prediabetes A1c of 5.8 A1c at next visit Diabetic diet and lifestyle modification  6.  Headache Placed on Fioricet Meds ordered this encounter  Medications  . nicotine (NICODERM CQ) 14 mg/24hr patch    Sig: Place 1 patch (14 mg total) onto the skin daily. For 4 weeks then 7mg /24hr daily for  4 weeks    Dispense:  28 patch    Refill:  1  . fluticasone (FLONASE) 50 MCG/ACT nasal spray    Sig: Place 2 sprays into  both nostrils daily.    Dispense:  16 g    Refill:  2  . butalbital-acetaminophen-caffeine (FIORICET) 50-325-40 MG tablet    Sig: Take 1 tablet by mouth every 12 (twelve) hours as needed for headache.    Dispense:  30 tablet    Refill:  1  . atorvastatin (LIPITOR) 20 MG tablet    Sig: Take 1 tablet (20 mg total) by mouth daily.    Dispense:  90 tablet    Refill:  0  . DULoxetine (CYMBALTA) 60 MG capsule    Sig: Take 1 capsule (60 mg total) by mouth daily.     Dispense:  30 capsule    Refill:  2    Follow-up: Return in about 3 months (around 09/19/2018) for medical conditions.       Charlott Rakes, MD, FAAFP. [facility] [place], [place]   06/19/2018, 10:21 AM

## 2018-06-23 ENCOUNTER — Telehealth: Payer: Self-pay | Admitting: Family Medicine

## 2018-06-23 NOTE — Telephone Encounter (Signed)
Patient called asking for orders to be sent to the new pelvic program please call her back at  [phone]

## 2018-06-23 NOTE — Telephone Encounter (Signed)
Call was placed to patient to obtain more information for the order.

## 2018-07-10 ENCOUNTER — Other Ambulatory Visit (INDEPENDENT_AMBULATORY_CARE_PROVIDER_SITE_OTHER): Payer: Self-pay | Admitting: Specialist

## 2018-07-10 DIAGNOSIS — M542 Cervicalgia: Secondary | ICD-10-CM

## 2018-07-10 DIAGNOSIS — M5136 Other intervertebral disc degeneration, lumbar region: Secondary | ICD-10-CM

## 2018-07-10 MED FILL — DULoxetine HCL 60 MG CPEP: 60 | 30 days supply | Qty: 30 | Fill #1

## 2018-07-10 MED FILL — CYCLOBENZAPRINE 10 MG TAB: 10 | 30 days supply | Qty: 60 | Fill #2

## 2018-07-10 NOTE — Telephone Encounter (Signed)
Tylenol #3 refill request

## 2018-07-11 MED FILL — ACETAMINOPHEN/COD #3 TABLET: 300-30 | 7 days supply | Qty: 21 | Fill #0

## 2018-07-13 MED FILL — NAPROXEN 500 MG TABLET: 500 | 30 days supply | Qty: 60 | Fill #3

## 2018-08-02 ENCOUNTER — Other Ambulatory Visit: Payer: Self-pay | Admitting: Family Medicine

## 2018-08-02 DIAGNOSIS — E7800 Pure hypercholesterolemia, unspecified: Secondary | ICD-10-CM

## 2018-08-02 MED FILL — ATORVASTATIN 20 MG TABLET: 20 | 90 days supply | Qty: 90 | Fill #0

## 2018-08-02 MED FILL — BUTALB-ACETAMIN-CAFF 50-325: 50-325-40 | 15 days supply | Qty: 30 | Fill #1

## 2018-08-03 MED FILL — FLUTICASONE PROP 50 MCG SPR: 50 | 30 days supply | Qty: 16 | Fill #1

## 2018-08-14 ENCOUNTER — Other Ambulatory Visit (INDEPENDENT_AMBULATORY_CARE_PROVIDER_SITE_OTHER): Payer: Self-pay | Admitting: Specialist

## 2018-08-14 ENCOUNTER — Other Ambulatory Visit: Payer: Self-pay | Admitting: Family Medicine

## 2018-08-14 DIAGNOSIS — G8929 Other chronic pain: Secondary | ICD-10-CM

## 2018-08-14 MED FILL — DULoxetine HCL 60 MG CPEP: 60 | 30 days supply | Qty: 30 | Fill #2

## 2018-08-14 MED FILL — CYCLOBENZAPRINE 10 MG TAB: 10 | 30 days supply | Qty: 60 | Fill #0

## 2018-08-14 MED FILL — NAPROXEN 500 MG TABLET: 500 | 30 days supply | Qty: 60 | Fill #0

## 2018-09-06 ENCOUNTER — Telehealth: Payer: Self-pay | Admitting: Physical Therapy

## 2018-09-06 NOTE — Telephone Encounter (Signed)
Left message- returning call back request from patient. Cheryl King [name] PT, DPT 09/06/18 10:38 AM

## 2018-09-12 MED FILL — DULoxetine HCL 60 MG CPEP: 60 | 30 days supply | Qty: 30 | Fill #0

## 2018-09-12 MED FILL — CYCLOBENZAPRINE 10 MG TAB: 10 | 30 days supply | Qty: 60 | Fill #1

## 2018-09-12 MED FILL — NAPROXEN 500 MG TABLET: 500 | 30 days supply | Qty: 60 | Fill #1

## 2018-09-14 ENCOUNTER — Telehealth: Payer: Self-pay

## 2018-09-18 ENCOUNTER — Other Ambulatory Visit: Payer: Self-pay

## 2018-09-18 ENCOUNTER — Encounter: Payer: Self-pay | Admitting: Physical Therapy

## 2018-09-18 ENCOUNTER — Ambulatory Visit: Payer: Medicaid Other | Attending: Family Medicine | Admitting: Physical Therapy

## 2018-09-18 DIAGNOSIS — M5441 Lumbago with sciatica, right side: Secondary | ICD-10-CM | POA: Insufficient documentation

## 2018-09-18 DIAGNOSIS — M5442 Lumbago with sciatica, left side: Secondary | ICD-10-CM | POA: Insufficient documentation

## 2018-09-18 DIAGNOSIS — R279 Unspecified lack of coordination: Secondary | ICD-10-CM

## 2018-09-18 DIAGNOSIS — G8929 Other chronic pain: Secondary | ICD-10-CM | POA: Diagnosis not present

## 2018-09-18 DIAGNOSIS — R252 Cramp and spasm: Secondary | ICD-10-CM

## 2018-09-18 DIAGNOSIS — M6281 Muscle weakness (generalized): Secondary | ICD-10-CM | POA: Diagnosis not present

## 2018-09-18 NOTE — Therapy (Signed)
[facility] [address], [place], [place], [address] Phone: [phone]   Fax:  [phone]  Physical Therapy Evaluation  Patient Details  Name: Cheryl King MRN: [id number] Date of Birth: Sep 13, 1965 Referring Provider (PT): Charlott Rakes, MD   Encounter Date: 09/18/2018  PT End of Session - 09/18/18 1020    Visit Number  1    Date for PT Re-Evaluation  12/11/18    Authorization Type  medicaid    Authorization - Visit Number  0    Authorization - Number of Visits  3    PT Start Time  [id number]    PT Stop Time  1048    PT Time Calculation (min)  30 min    Activity Tolerance  Patient tolerated treatment well    Behavior During Therapy  [facility] for tasks assessed/performed       Past Medical History:  Diagnosis Date  . Acid reflux   . Allergy   . Carpal tunnel syndrome   . Depression   . Diverticulitis   . Hyperlipidemia   . Low back pain   . Neck pain   . Sacroiliac inflammation ([place])   . Sciatica   . Seasonal allergies     Past Surgical History:  Procedure Laterality Date  . ABDOMINAL SURGERY    . ANKLE SURGERY     lt.  Marland Kitchen PARTIAL HYSTERECTOMY    . right breast cyst removed     . URETHRAL DIVERTICULUM REPAIR     uretrral diverticultis    There were no vitals filed for this visit.   Subjective Assessment - 09/18/18 1019    Subjective  Pt states she had back pain after incourse one time, overall just chronic back pain.  Has urinary leakage.  I am having leakage every day and it is a few tablespoons.  Pt is wearing panty lines when she goes.  I do tennis ball massage and move and stretch and that helps a little.    Pertinent History  urethra surgery 10+ years ago, hysterectomy, chronic back and hip pain    How long can you stand comfortably?  15-20 minutes    How long can you walk comfortably?  don't do it    Patient Stated Goals  less leakage; decrease pain    Currently in Pain?  Yes    Pain  Score  8     Pain Location  Back    Pain Orientation  Lower    Pain Descriptors / Indicators  Spasm    Pain Type  Chronic pain    Pain Radiating Towards  back pain radiates down legs into feet - bilat    Pain Onset  More than a month ago    Aggravating Factors   vacuum, mowing, standing         [facility] PT Assessment - 09/18/18 0001      Assessment   Medical Diagnosis  R10.2 (ICD-10-CM) - Pelvic pain; M54.5 (ICD-10-CM) - Low back pain    Referring Provider (PT)  Charlott Rakes, MD      Precautions   Precautions  None      Restrictions   Weight Bearing Restrictions  No      Balance Screen   Has the patient fallen in the past 6 months  No      [place] residence    Living Arrangements  Alone      Prior Function  Level of Independence  Independent    Leisure  stretches, house stuff, sitting      Cognition   Overall Cognitive Status  Within Functional Limits for tasks assessed      Posture/Postural Control   Postural Limitations  Decreased lumbar lordosis;Decreased thoracic kyphosis;Anterior pelvic tilt      AROM   Lumbar - Right Side Bend  50% bil glut pain    Lumbar - Left Side Bend  50% bil glut pain      Strength   Right Hip Flexion  4+/5    Right Hip Extension  5/5    Right Hip ABduction  4+/5    Left Hip Flexion  4+/5    Left Hip Extension  5/5    Left Hip ABduction  5/5      Flexibility   Soft Tissue Assessment /Muscle Length  yes    Hamstrings  100%      Palpation   Palpation comment  Rt back and Lt hip sore with a lot of pressure      Straight Leg Raise   Findings  Negative      Ambulation/Gait   Gait Pattern  Decreased stance time - right                Objective measurements completed on examination: See above findings.    Pelvic Floor Special Questions - 09/18/18 0001    Prior Pelvic/Prostate Exam  Yes    Are you Pregnant or attempting pregnancy?  No    Prior Pregnancies  No    Currently  Sexually Active  No    Marinoff Scale  pain prevents any attempts at intercourse    Urinary Leakage  Yes    Activities that cause leaking  Coughing;Sneezing;Other    Other activities that cause leaking  out of nowhere - unknown    Urinary urgency  No    Fecal incontinence  No    Falling out feeling (prolapse)  No    Skin Integrity  Intact    Perineal Body/Introitus   Elevated    Prolapse  None    Pelvic Floor Internal Exam  pt informed and identity confirmed for informed consent given to perform internal soft tissue assess and treat    Exam Type  Vaginal    Palpation  very tight bilaterally, pubococcygeus and tranverse peroneus, obdurator internus    Strength  fair squeeze, definite lift    Tone  high       [facility] Adult PT Treatment/Exercise - 09/18/18 0001      Self-Care   Self-Care  Other Self-Care Comments    Other Self-Care Comments   self massage to stretch pelvic floor                  PT Long Term Goals - 09/18/18 1057      PT LONG TERM GOAL #1   Title  Pt will report 50% less urinary leakage    Baseline  no change - eval only     Time  12    Period  Weeks    Status  New    Target Date  12/11/18      PT LONG TERM GOAL #2   Title  pt will be able to sustain pelvic floor contraction for at least 10 seconds in order to having reduced urinary leakage throughout a typical day    Baseline  5 seconds    Time  12    Period  Weeks    Status  New    Target Date  12/11/18      PT LONG TERM GOAL #3   Title  pt will be ind with advanced HEP for maintaining improved core and pelvic floor strength    Baseline  does not know    Time  12    Period  Weeks    Status  New    Target Date  12/11/18      PT LONG TERM GOAL #4   Title  Pt will report 50% reduction in low back pain    Baseline  no change - eval only today    Time  12    Period  Weeks    Status  New    Target Date  12/11/18      PT LONG TERM GOAL #5   Title  Pt will not have to wear panty liners  when going out due to improved pelvic strength    Time  12    Period  Weeks    Status  New    Target Date  12/11/18             Plan - 09/18/18 1054    Clinical Impression Statement  She has chronic hip pain that she believes to be bursitis as well as chronic low back pain. She has been having urinary leakage occasionally. Pt demonstrates decreased lumbar ROM. She has decreased hip IR, full ROM in hips otherwise without pain. Pt has high tone pelvic floor and tight obdurator internus Lt>Rt. Pt has fascial restriction T8-lumbar spine. She demonstrates impaired pelvic floor strength with endurance of 7 seconds. She will benefit from skilled PT to address her impairments and assist in pain management so she can return to maximum level of functional activities.    Comorbidities  chronic pain of multiple regions, hysterectomy and urethral surgery    Examination-Activity Limitations  Stand;Sleep;Sit    Examination-Participation Restrictions  Community Activity;Meal Prep;Shop    Stability/Clinical Decision Making  Evolving/Moderate complexity    Clinical Decision Making  Moderate    Rehab Potential  Good    PT Frequency  2x / week    PT Duration  12 weeks    PT Treatment/Interventions  ADLs/Self Care Home Management;Cryotherapy;Electrical Stimulation;Ultrasound;Traction;Moist Heat;Therapeutic activities;Therapeutic exercise;Manual techniques;Patient/family education;Passive range of motion;Dry needling;Taping;Spinal Manipulations;Joint Manipulations;Biofeedback;Neuromuscular re-education    PT Next Visit Plan  internal STM, lumbar ROM and hip IR ROM    PT Home Exercise Plan  self massage    Consulted and Agree with Plan of Care  Patient       Patient will benefit from skilled therapeutic intervention in order to improve the following deficits and impairments:  Difficulty walking, Increased muscle spasms, Decreased activity tolerance, Hypermobility, Pain, Postural dysfunction, Decreased  strength, Decreased coordination, Increased fascial restricitons  Visit Diagnosis: Chronic bilateral low back pain with bilateral sciatica  Muscle weakness (generalized)  Unspecified lack of coordination  Cramp and spasm     Problem List Patient Active Problem List   Diagnosis Date Noted  . Hyperlipidemia 07/18/2017  . Ulnar neuropathy 04/18/2017  . GERD (gastroesophageal reflux disease) 10/19/2016  . Prediabetes 05/03/2016  . Cubital tunnel syndrome on right 03/01/2016  . Numbness and tingling of right arm 01/01/2016  . Chlamydia infection 01/27/2015  . Smoking 12/26/2013  . Chronic lower back pain 04/13/2013  . Sciatica 04/13/2013  . Chronic neck pain 04/13/2013  . Spondylosis of cervical spine 04/13/2013    Jule Ser, PT  09/18/2018, 1:37 PM  [facility] [address], [place], [place], [address] Phone: [phone]   Fax:  [phone]  Name: Victoriana Spilker MRN: [id number] Date of Birth: 06-19-1965

## 2018-09-18 NOTE — Patient Instructions (Signed)
STRETCHING THE PELVIC FLOOR MUSCLES NO DILATOR  Supplies . Vaginal lubricant . Mirror (optional) . Gloves (optional) Positioning . Start in a semi-reclined position with your head propped up. Bend your knees and place your thumb or finger at the vaginal opening. Procedure . Apply a moderate amount of lubricant on the outer skin of your vagina, the labia minora.  Apply additional lubricant to your finger. . Spread the skin away from the vaginal opening. Place the end of your finger at the opening. . Do a maximum contraction of the pelvic floor muscles. Tighten the vagina and the anus maximally and relax. . When you know they are relaxed, gently and slowly insert your finger into your vagina, directing your finger slightly downward, for 2-3 inches of insertion. . Relax and stretch the 6 o'clock position . Hold each stretch for _2 min__ and repeat __1_ time with rest breaks of _1__ seconds between each stretch. . Repeat the stretching in the 4 o'clock and 8 o'clock positions. . Total time should be _6__ minutes, _1__ x per day.  Note the amount of theme your were able to achieve and your tolerance to your finger in your vagina. . Once you have accomplished the techniques you may try them in standing with one foot resting on the tub, or in other positions.  This is a good stretch to do in the shower if you don't need to use lubricant.   [facility] [address], [address] [place], [place] [address] Phone # [phone] Fax [phone]  

## 2018-09-19 ENCOUNTER — Encounter: Payer: Self-pay | Admitting: Family Medicine

## 2018-09-19 ENCOUNTER — Ambulatory Visit: Payer: Medicaid Other | Attending: Family Medicine | Admitting: Family Medicine

## 2018-09-19 DIAGNOSIS — E7800 Pure hypercholesterolemia, unspecified: Secondary | ICD-10-CM | POA: Diagnosis not present

## 2018-09-19 DIAGNOSIS — R7303 Prediabetes: Secondary | ICD-10-CM

## 2018-09-19 DIAGNOSIS — M5442 Lumbago with sciatica, left side: Secondary | ICD-10-CM | POA: Diagnosis not present

## 2018-09-19 DIAGNOSIS — M5441 Lumbago with sciatica, right side: Secondary | ICD-10-CM

## 2018-09-19 DIAGNOSIS — G8929 Other chronic pain: Secondary | ICD-10-CM | POA: Diagnosis not present

## 2018-09-19 DIAGNOSIS — G44209 Tension-type headache, unspecified, not intractable: Secondary | ICD-10-CM | POA: Diagnosis not present

## 2018-09-19 MED ORDER — CYCLOBENZAPRINE HCL 10 MG PO TABS: 10.0000 mg | ORAL_TABLET | Freq: Two times a day (BID) | ORAL | 2 refills | Status: DC | PRN

## 2018-09-19 MED ORDER — DULOXETINE HCL 60 MG PO CPEP: 60.0000 mg | ORAL_CAPSULE | Freq: Every day | ORAL | 6 refills | Status: DC

## 2018-09-19 MED ORDER — BUTALBITAL-APAP-CAFFEINE 50-325-40 MG PO TABS: 2.0000 | ORAL_TABLET | Freq: Two times a day (BID) | ORAL | 1 refills | Status: DC | PRN

## 2018-09-19 MED ORDER — ATORVASTATIN CALCIUM 20 MG PO TABS: 20.0000 mg | ORAL_TABLET | Freq: Every day | ORAL | 1 refills | Status: DC

## 2018-09-19 MED ORDER — PREGABALIN 50 MG PO CAPS: ORAL_CAPSULE | ORAL | 2 refills | Status: DC

## 2018-09-19 MED FILL — BUTALB-ACETAMIN-CAFF 50-325: 50-325-40 | 30 days supply | Qty: 30 | Fill #0

## 2018-09-19 NOTE — Progress Notes (Signed)
Patient has been called and DOB has been verified. Patient has been screened and transferred to PCP to start phone visit.     

## 2018-09-19 NOTE — Progress Notes (Signed)
Virtual Visit via Telephone Note  I connected with Cheryl King, on 09/19/2018 at 8:38 AM by telephone due to the COVID-19 pandemic and verified that I am speaking with the correct person using two identifiers.   Consent: I discussed the limitations, risks, security and privacy concerns of performing an evaluation and management service by telephone and the availability of in person appointments. I also discussed with the patient that there may be a patient responsible charge related to this service. The patient expressed understanding and agreed to proceed.   Location of Patient: Home  Location of Provider: Clinic   Persons participating in Telemedicine visit: Tanai Bouler Farrington-CMA Dr. Felecia Shelling     History of Present Illness: Cheryl King is 53 year old female with Medical history  significant for Prediabetes (diet controlled A1c 5.8) chronic low back pain with sciatica, GERD, hyperlipidemia tobacco abuse here for a follow-up visit. She is currently undergoing pelvic rehab and this has helped a little bit with her low back pain but she has had a slight setback due to the ongoing pandemic. Pain is persistent and chronic and she has been on Cymbalta and Flexeril with some relief; she has not had an appointment with her orthopedic Dr. Louanne Skye in a while.  At her last office visit I had placed on Fioricet for headaches which she states has been minimally effective as headache will just subside for a little bit and she would have to wait another 12 hours to take a second dose.  Headaches are infrequent occurring about once or twice per month but would usually last from morning till nighttime.  She denies blurry vision, nausea or vomiting associated with this and there are no triggers. She is compliant with her statin   Past Medical History:  Diagnosis Date  . Acid reflux   . Allergy   . Carpal tunnel syndrome   . Depression   . Diverticulitis   .  Hyperlipidemia   . Low back pain   . Neck pain   . 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 Sciatica   . Seasonal allergies    Allergies  Allergen Reactions  . Percocet [Oxycodone-Acetaminophen] Nausea Only  . Vicodin [Hydrocodone-Acetaminophen] Nausea Only  . Penicillins Rash    Current Outpatient Medications on File Prior to Visit  Medication Sig Dispense Refill  . atorvastatin (LIPITOR) 20 MG tablet TAKE 1 TABLET (20 MG TOTAL) BY MOUTH DAILY. 90 tablet 0  . butalbital-acetaminophen-caffeine (FIORICET) 50-325-40 MG tablet Take 1 tablet by mouth every 12 (twelve) hours as needed for headache. 30 tablet 1  . cetirizine (ZYRTEC) 10 MG tablet Take 1 tablet (10 mg total) by mouth daily. 90 tablet 1  . cyclobenzaprine (FLEXERIL) 10 MG tablet TAKE 1 TABLET BY MOUTH TWO TIMES DAILY AS NEEDED FOR MUSCLE SPASMS. 60 tablet 2  . diclofenac sodium (VOLTAREN) 1 % GEL Apply 2 g topically 4 (four) times daily. 5 Tube 2  . DULoxetine (CYMBALTA) 60 MG capsule Take 1 capsule (60 mg total) by mouth daily. 30 capsule 2  . fluticasone (FLONASE) 50 MCG/ACT nasal spray Place 2 sprays into both nostrils daily. 16 g 2  . Lactobacillus CAPS Take 1 capsule by mouth daily.    . Multiple Vitamin (MULTIVITAMIN WITH MINERALS) TABS tablet Take 1 tablet by mouth daily.    . naproxen (NAPROSYN) 500 MG tablet TAKE 1 TABLET BY MOUTH 2 TIMES DAILY WITH A MEAL. 60 tablet 3  . ondansetron (ZOFRAN) 4 MG tablet TAKE 1 TABLET BY MOUTH EVERY  8 HOURS AS NEEDED FOR NAUSEA OR VOMITING. 60 tablet 1  . pregabalin (LYRICA) 50 MG capsule TAKE 1 CAPSULE BY MOUTH EVERYDAY AT BEDTIME 30 capsule 2  . RESTASIS 0.05 % ophthalmic emulsion   3  . traMADol (ULTRAM) 50 MG tablet Take by mouth every 6 (six) hours as needed.    . triamcinolone cream (KENALOG) 0.1 % Apply 1 application topically 2 (two) times daily. 45 g 1  . metroNIDAZOLE (METROGEL) 0.75 % vaginal gel Place 1 Applicatorful vaginally 2 (two) times daily. (Patient not taking: Reported on  06/19/2018) 70 g 0  . nicotine (NICODERM CQ) 14 mg/24hr patch Place 1 patch (14 mg total) onto the skin daily. For 4 weeks then 77m/24hr daily for  4 weeks (Patient not taking: Reported on 09/19/2018) 28 patch 1  . predniSONE (STERAPRED UNI-PAK 21 TAB) 10 MG (21) TBPK tablet Take by mouth daily. Take 6 tabs by mouth daily for 2 days, then 5 tabs for 2 days, then 4 tabs for 2 days, then 3 tabs for 2 days, 2 tabs for 2 days, then 1 tab by mouth daily for 2 days (Patient not taking: Reported on 06/19/2018) 42 tablet 0   Current Facility-Administered Medications on File Prior to Visit  Medication Dose Route Frequency Provider Last Rate Last Dose  . 0.9 %  sodium chloride infusion  500 mL Intravenous Once DDoran Stabler MD        Observations/Objective: Alert, awake, oriented x3 Not in acute distress  Assessment and Plan: 1. Chronic midline low back pain with bilateral sciatica Stable - cyclobenzaprine (FLEXERIL) 10 MG tablet; Take 1 tablet (10 mg total) by mouth 2 (two) times daily as needed for muscle spasms.  Dispense: 60 tablet; Refill: 2 - pregabalin (LYRICA) 50 MG capsule; TAKE 1 CAPSULE BY MOUTH EVERYDAY AT BEDTIME  Dispense: 30 capsule; Refill: 2 - DULoxetine (CYMBALTA) 60 MG capsule; Take 1 capsule (60 mg total) by mouth daily.  Dispense: 30 capsule; Refill: 6  2. Pure hypercholesterolemia Continue Rehab - atorvastatin (LIPITOR) 20 MG tablet; Take 1 tablet (20 mg total) by mouth daily.  Dispense: 90 tablet; Refill: 1 - CMP14+EGFR; Future - Lipid panel; Future  3. Tension headache Uncontrolled Increased dose of Fioricet Holding off on prophylactic headache medications as symptoms are infrequent Advised to use OTC Excedrin migraines if uncontrolled on Fioricet - butalbital-acetaminophen-caffeine (FIORICET) 50-325-40 MG tablet; Take 2 tablets by mouth every 12 (twelve) hours as needed for headache.  Dispense: 30 tablet; Refill: 1  4. Prediabetes Last A1c was 5.8 - Hemoglobin A1c;  Future   Follow Up Instructions: Return in about 3 months (around 12/19/2018). Return for flu shot and fasting labs tomorrow   I discussed the assessment and treatment plan with the patient. The patient was provided an opportunity to ask questions and all were answered. The patient agreed with the plan and demonstrated an understanding of the instructions.   The patient was advised to call back or seek an in-person evaluation if the symptoms worsen or if the condition fails to improve as anticipated.     I provided 15 minutes total of non-face-to-face time during this encounter including median intraservice time, reviewing previous notes, labs, imaging, medications, management and patient verbalized understanding.     ECharlott Rakes MD, FAAFP. [facility] [place]  09/19/2018, 8:38 AM

## 2018-09-20 ENCOUNTER — Other Ambulatory Visit: Payer: Self-pay

## 2018-09-20 ENCOUNTER — Ambulatory Visit: Payer: Medicaid Other | Attending: Family Medicine

## 2018-09-20 ENCOUNTER — Ambulatory Visit (HOSPITAL_BASED_OUTPATIENT_CLINIC_OR_DEPARTMENT_OTHER): Payer: Medicaid Other | Admitting: Pharmacist

## 2018-09-20 DIAGNOSIS — R7303 Prediabetes: Secondary | ICD-10-CM | POA: Diagnosis not present

## 2018-09-20 DIAGNOSIS — E7800 Pure hypercholesterolemia, unspecified: Secondary | ICD-10-CM

## 2018-09-20 DIAGNOSIS — Z23 Encounter for immunization: Secondary | ICD-10-CM | POA: Diagnosis not present

## 2018-09-20 NOTE — Progress Notes (Signed)
Patient presents for vaccination against influenza per orders of Dr. Newlin. Consent given. Counseling provided. No contraindications exists. Vaccine administered without incident.   

## 2018-09-21 LAB — CMP14+EGFR
ALT: 27 IU/L (ref 0–32)
AST: 28 IU/L (ref 0–40)
Albumin/Globulin Ratio: 1.9 (ref 1.2–2.2)
Albumin: 4.4 g/dL (ref 3.8–4.9)
Alkaline Phosphatase: 113 IU/L (ref 39–117)
BUN/Creatinine Ratio: 24 — ABNORMAL HIGH (ref 9–23)
BUN: 17 mg/dL (ref 6–24)
Bilirubin Total: 0.3 mg/dL (ref 0.0–1.2)
CO2: 27 mmol/L (ref 20–29)
Calcium: 9.7 mg/dL (ref 8.7–10.2)
Chloride: 102 mmol/L (ref 96–106)
Creatinine, Ser: 0.72 mg/dL (ref 0.57–1.00)
GFR calc Af Amer: 109 mL/min/{1.73_m2} (ref >59)
GFR calc non Af Amer: 95 mL/min/{1.73_m2} (ref >59)
Globulin, Total: 2.3 g/dL (ref 1.5–4.5)
Glucose: 92 mg/dL (ref 65–99)
Potassium: 4.9 mmol/L (ref 3.5–5.2)
Sodium: 141 mmol/L (ref 134–144)
Total Protein: 6.7 g/dL (ref 6.0–8.5)

## 2018-09-21 LAB — LIPID PANEL
Chol/HDL Ratio: 3 ratio (ref 0.0–4.4)
Cholesterol, Total: 202 mg/dL — ABNORMAL HIGH (ref 100–199)
HDL: 67 mg/dL (ref >39)
LDL Chol Calc (NIH): 120 mg/dL — ABNORMAL HIGH (ref 0–99)
Triglycerides: 83 mg/dL (ref 0–149)
VLDL Cholesterol Cal: 15 mg/dL (ref 5–40)

## 2018-09-21 LAB — HEMOGLOBIN A1C
Est. average glucose Bld gHb Est-mCnc: 114 mg/dL
Hgb A1c MFr Bld: 5.6 % (ref 4.8–5.6)

## 2018-09-22 NOTE — Telephone Encounter (Signed)
error 

## 2018-09-27 ENCOUNTER — Ambulatory Visit: Payer: Medicaid Other | Attending: Family Medicine | Admitting: Physical Therapy

## 2018-09-27 ENCOUNTER — Encounter: Payer: Self-pay | Admitting: Physical Therapy

## 2018-09-27 ENCOUNTER — Other Ambulatory Visit: Payer: Self-pay

## 2018-09-27 DIAGNOSIS — M5441 Lumbago with sciatica, right side: Secondary | ICD-10-CM | POA: Insufficient documentation

## 2018-09-27 DIAGNOSIS — M5442 Lumbago with sciatica, left side: Secondary | ICD-10-CM | POA: Diagnosis not present

## 2018-09-27 DIAGNOSIS — R279 Unspecified lack of coordination: Secondary | ICD-10-CM | POA: Insufficient documentation

## 2018-09-27 DIAGNOSIS — G8929 Other chronic pain: Secondary | ICD-10-CM | POA: Diagnosis not present

## 2018-09-27 DIAGNOSIS — M6281 Muscle weakness (generalized): Secondary | ICD-10-CM | POA: Diagnosis not present

## 2018-09-27 DIAGNOSIS — R252 Cramp and spasm: Secondary | ICD-10-CM | POA: Diagnosis not present

## 2018-09-27 NOTE — Patient Instructions (Signed)
Access Code: [email or web address]  URL: [email or web address]  Date: 09/27/2018  Prepared by: Jari Favre   Exercises  Supine Diaphragmatic Breathing with Pelvic Floor Lengthening - 10 reps - 1 sets - 3x daily - 7x weekly  Supine Piriformis Stretch - 3 reps - 1 sets - 30 sec hold - 1x daily - 7x weekly  Supine Double Knee to Chest - 3 reps - 1 sets - 30 sec hold - 1x daily - 7x weekly

## 2018-09-27 NOTE — Therapy (Signed)
[facility] [address] [place], [place], [address] Phone: [phone]   Fax:  [phone]  Physical Therapy Treatment  Patient Details  Name: Cheryl King MRN: [id number] Date of Birth: 07/31/1965 Referring Provider (PT): Charlott Rakes, MD   Encounter Date: 09/27/2018  PT End of Session - 09/27/18 1617    Visit Number  2    Date for PT Re-Evaluation  12/11/18    Authorization Type  medicaid    Authorization - Visit Number  1    Authorization - Number of Visits  3    PT Start Time  [id number]    PT Stop Time  1613    PT Time Calculation (min)  40 min    Activity Tolerance  Patient tolerated treatment well    Behavior During Therapy  [facility] for tasks assessed/performed       Past Medical History:  Diagnosis Date  . Acid reflux   . Allergy   . Carpal tunnel syndrome   . Depression   . Diverticulitis   . Hyperlipidemia   . Low back pain   . Neck pain   . 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 Sciatica   . Seasonal allergies     Past Surgical History:  Procedure Laterality Date  . ABDOMINAL SURGERY    . ANKLE SURGERY     lt.  Marland Kitchen PARTIAL HYSTERECTOMY    . right breast cyst removed     . URETHRAL DIVERTICULUM REPAIR     uretrral diverticultis    There were no vitals filed for this visit.  Subjective Assessment - 09/27/18 1537    Subjective  I was in a little pain after doing the self massage, but then I felt better one day with no leakage.    Patient Stated Goals  less leakage; decrease pain    Currently in Pain?  Yes    Pain Score  7     Pain Location  Back    Pain Orientation  Lower    Pain Descriptors / Indicators  Spasm    Pain Type  Chronic pain    Pain Onset  More than a month ago    Pain Frequency  Constant    Multiple Pain Sites  No                       [facility] Adult PT Treatment/Exercise - 09/27/18 0001      Neuro Re-ed    Neuro Re-ed Details   breathing correctly to  relax pelvic floor instead of contracting - tactile cues along with internal STM as described below      Lumbar Exercises: Stretches   Active Hamstring Stretch  Right;Left;3 reps    Double Knee to Chest Stretch  3 reps;30 seconds    Piriformis Stretch  Right;Left;3 reps;20 seconds    Piriformis Stretch Limitations  breathing and relax      Manual Therapy   Manual Therapy  Internal Pelvic Floor    Manual therapy comments  pt identity confirmed and informed consent given to perform internal soft tissue assessment    Internal Pelvic Floor  bulbocavernisis and ischiocavernosis tight and difficult relaxing with breathing, needed cues verbal and tactile, she did better with verbal cues had a hard time relaxing with pressure             PT Education - 09/27/18 1616    Education Details  Access Code: [email or web address]  and self massage  reviewed    Person(s) Educated  Patient    Methods  Explanation;Demonstration;Handout;Verbal cues    Comprehension  Verbalized understanding;Returned demonstration          PT Long Term Goals - 09/18/18 1057      PT LONG TERM GOAL #1   Title  Pt will report 50% less urinary leakage    Baseline  no change - eval only     Time  12    Period  Weeks    Status  New    Target Date  12/11/18      PT LONG TERM GOAL #2   Title  pt will be able to sustain pelvic floor contraction for at least 10 seconds in order to having reduced urinary leakage throughout a typical day    Baseline  5 seconds    Time  12    Period  Weeks    Status  New    Target Date  12/11/18      PT LONG TERM GOAL #3   Title  pt will be ind with advanced HEP for maintaining improved core and pelvic floor strength    Baseline  does not know    Time  12    Period  Weeks    Status  New    Target Date  12/11/18      PT LONG TERM GOAL #4   Title  Pt will report 50% reduction in low back pain    Baseline  no change - eval only today    Time  12    Period  Weeks    Status  New    Target  Date  12/11/18      PT LONG TERM GOAL #5   Title  Pt will not have to wear panty liners when going out due to improved pelvic strength    Time  12    Period  Weeks    Status  New    Target Date  12/11/18            Plan - 09/27/18 1718    Clinical Impression Statement  Pt had a hard time coordinating pelvic floor relaxation with breathing ,so much of today's session focused on muscle coordination using verbal and tactile feedback.  Pt was able to relax more with breathing than with manual stretching to muscle.  She was educated in breathing with additional stretches for initial HEP today.    PT Treatment/Interventions  ADLs/Self Care Home Management;Cryotherapy;Electrical Stimulation;Ultrasound;Traction;Moist Heat;Therapeutic activities;Therapeutic exercise;Manual techniques;Patient/family education;Passive range of motion;Dry needling;Taping;Spinal Manipulations;Joint Manipulations;Biofeedback;Neuromuscular re-education    PT Next Visit Plan  breathing and biofeedback for downtraining    Consulted and Agree with Plan of Care  Patient       Patient will benefit from skilled therapeutic intervention in order to improve the following deficits and impairments:  Difficulty walking, Increased muscle spasms, Decreased activity tolerance, Hypermobility, Pain, Postural dysfunction, Decreased strength, Decreased coordination, Increased fascial restricitons  Visit Diagnosis: Chronic bilateral low back pain with bilateral sciatica  Muscle weakness (generalized)  Unspecified lack of coordination  Cramp and spasm     Problem List Patient Active Problem List   Diagnosis Date Noted  . Hyperlipidemia 07/18/2017  . Ulnar neuropathy 04/18/2017  . GERD (gastroesophageal reflux disease) 10/19/2016  . Prediabetes 05/03/2016  . Cubital tunnel syndrome on right 03/01/2016  . Numbness and tingling of right arm 01/01/2016  . Chlamydia infection 01/27/2015  . Smoking 12/26/2013  . Chronic  lower back  pain 04/13/2013  . Sciatica 04/13/2013  . Chronic neck pain 04/13/2013  . Spondylosis of cervical spine 04/13/2013    Jule Ser, PT 09/27/2018, 5:22 PM  [facility] [address] [place], [place], [address] Phone: [phone]   Fax:  [phone]  Name: Emanuella Schleimer MRN: [id number] Date of Birth: 15-Jan-1966

## 2018-10-04 ENCOUNTER — Ambulatory Visit: Payer: Medicaid Other | Admitting: Physical Therapy

## 2018-10-04 ENCOUNTER — Other Ambulatory Visit: Payer: Self-pay

## 2018-10-04 ENCOUNTER — Encounter: Payer: Self-pay | Admitting: Physical Therapy

## 2018-10-04 DIAGNOSIS — R252 Cramp and spasm: Secondary | ICD-10-CM

## 2018-10-04 DIAGNOSIS — G8929 Other chronic pain: Secondary | ICD-10-CM | POA: Diagnosis not present

## 2018-10-04 DIAGNOSIS — M6281 Muscle weakness (generalized): Secondary | ICD-10-CM | POA: Diagnosis not present

## 2018-10-04 DIAGNOSIS — M5442 Lumbago with sciatica, left side: Secondary | ICD-10-CM | POA: Diagnosis not present

## 2018-10-04 DIAGNOSIS — M5441 Lumbago with sciatica, right side: Secondary | ICD-10-CM | POA: Diagnosis not present

## 2018-10-04 DIAGNOSIS — R279 Unspecified lack of coordination: Secondary | ICD-10-CM

## 2018-10-04 NOTE — Therapy (Signed)
[facility] [address] [place], [place], [address] Phone: [phone]   Fax:  [phone]  Physical Therapy Treatment  Patient Details  Name: Cheryl King MRN: [id number] Date of Birth: 1965/09/05 Referring Provider (PT): Charlott Rakes, MD   Encounter Date: 10/04/2018  PT End of Session - 10/04/18 1000    Visit Number  3    Date for PT Re-Evaluation  12/11/18    Authorization Type  medicaid    Authorization - Visit Number  2    Authorization - Number of Visits  3    PT Start Time  1000    PT Stop Time  1045    PT Time Calculation (min)  45 min    Activity Tolerance  Patient tolerated treatment well    Behavior During Therapy  [facility] for tasks assessed/performed       Past Medical History:  Diagnosis Date  . Acid reflux   . Allergy   . Carpal tunnel syndrome   . Depression   . Diverticulitis   . Hyperlipidemia   . Low back pain   . Neck pain   . 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 Sciatica   . Seasonal allergies     Past Surgical History:  Procedure Laterality Date  . ABDOMINAL SURGERY    . ANKLE SURGERY     lt.  Marland Kitchen PARTIAL HYSTERECTOMY    . right breast cyst removed     . URETHRAL DIVERTICULUM REPAIR     uretrral diverticultis    There were no vitals filed for this visit.  Subjective Assessment - 10/04/18 1004    Subjective  Pt states pain was really bad yesterday and has had a lot of leakage this week.  States she has leaked about 2x/ day which is more than usual.    Patient Stated Goals  less leakage; decrease pain    Currently in Pain?  Yes    Pain Score  8     Pain Location  Back    Pain Orientation  Lower    Pain Radiating Towards  Rt side, Rt hip and buttocks    Pain Onset  More than a month ago    Pain Frequency  Intermittent    Aggravating Factors   not sure    Multiple Pain Sites  No                       [facility] Adult PT Treatment/Exercise - 10/04/18 0001       Neuro Re-ed    Neuro Re-ed Details   used biofeedback throughout for correct muscle coordination during exercises      Lumbar Exercises: Stretches   Piriformis Stretch  Right;Left;3 reps;20 seconds      Lumbar Exercises: Supine   Other Supine Lumbar Exercises  small march and bend knee drop out - 10 sec hold, 5 sec rest - used biofeedback for cues to relax      Lumbar Exercises: Sidelying   Clam  10 reps;Right;Left;5 seconds      Manual Therapy   Manual Therapy  Soft tissue mobilization    Soft tissue mobilization  rectus abdominus attachment - distal and bilateral - tight and TTP - softened with STM                  PT Long Term Goals - 09/18/18 1057      PT LONG TERM GOAL #1   Title  Pt  will report 50% less urinary leakage    Baseline  no change - eval only     Time  12    Period  Weeks    Status  New    Target Date  12/11/18      PT LONG TERM GOAL #2   Title  pt will be able to sustain pelvic floor contraction for at least 10 seconds in order to having reduced urinary leakage throughout a typical day    Baseline  5 seconds    Time  12    Period  Weeks    Status  New    Target Date  12/11/18      PT LONG TERM GOAL #3   Title  pt will be ind with advanced HEP for maintaining improved core and pelvic floor strength    Baseline  does not know    Time  12    Period  Weeks    Status  New    Target Date  12/11/18      PT LONG TERM GOAL #4   Title  Pt will report 50% reduction in low back pain    Baseline  no change - eval only today    Time  12    Period  Weeks    Status  New    Target Date  12/11/18      PT LONG TERM GOAL #5   Title  Pt will not have to wear panty liners when going out due to improved pelvic strength    Time  12    Period  Weeks    Status  New    Target Date  12/11/18            Plan - 10/04/18 1026    Clinical Impression Statement  Biofeedback was used today to visualize what patient was doing during her stretches and  exercises. Pt did well with relaxing and getting reduced tone in pelvic floor after the exericses today.  She was fatigued with basic pelvic floor strengthening exercises in supine.  Pt will benefit from skilled PT to continue to work on endurance and strength for greater core and bladder control.    PT Treatment/Interventions  ADLs/Self Care Home Management;Cryotherapy;Electrical Stimulation;Ultrasound;Traction;Moist Heat;Therapeutic activities;Therapeutic exercise;Manual techniques;Patient/family education;Passive range of motion;Dry needling;Taping;Spinal Manipulations;Joint Manipulations;Biofeedback;Neuromuscular re-education    PT Next Visit Plan  f/u on adding core and pelvic floor strengthening, continue core and pelvic strengthening, biofeedback as needed    PT Home Exercise Plan  [email or web address]    Consulted and Agree with Plan of Care  Patient       Patient will benefit from skilled therapeutic intervention in order to improve the following deficits and impairments:  Difficulty walking, Increased muscle spasms, Decreased activity tolerance, Hypermobility, Pain, Postural dysfunction, Decreased strength, Decreased coordination, Increased fascial restricitons  Visit Diagnosis: Chronic bilateral low back pain with bilateral sciatica  Muscle weakness (generalized)  Unspecified lack of coordination  Cramp and spasm     Problem List Patient Active Problem List   Diagnosis Date Noted  . Hyperlipidemia 07/18/2017  . Ulnar neuropathy 04/18/2017  . GERD (gastroesophageal reflux disease) 10/19/2016  . Prediabetes 05/03/2016  . Cubital tunnel syndrome on right 03/01/2016  . Numbness and tingling of right arm 01/01/2016  . Chlamydia infection 01/27/2015  . Smoking 12/26/2013  . Chronic lower back pain 04/13/2013  . Sciatica 04/13/2013  . Chronic neck pain 04/13/2013  . Spondylosis of cervical spine 04/13/2013    Janey Genta L  Desenglau, PT 10/04/2018, 10:59 AM  [facility] [address] [place], [place], [address] Phone: [phone]   Fax:  [phone]  Name: Shanovia Buschmann MRN: [id number] Date of Birth: 1965/06/02

## 2018-10-10 ENCOUNTER — Other Ambulatory Visit: Payer: Self-pay | Admitting: Family Medicine

## 2018-10-10 DIAGNOSIS — G8929 Other chronic pain: Secondary | ICD-10-CM

## 2018-10-10 DIAGNOSIS — M5442 Lumbago with sciatica, left side: Secondary | ICD-10-CM

## 2018-10-11 ENCOUNTER — Ambulatory Visit: Payer: Medicaid Other | Admitting: Physical Therapy

## 2018-10-11 ENCOUNTER — Other Ambulatory Visit: Payer: Self-pay

## 2018-10-11 DIAGNOSIS — G8929 Other chronic pain: Secondary | ICD-10-CM

## 2018-10-11 DIAGNOSIS — R252 Cramp and spasm: Secondary | ICD-10-CM

## 2018-10-11 DIAGNOSIS — M5442 Lumbago with sciatica, left side: Secondary | ICD-10-CM | POA: Diagnosis not present

## 2018-10-11 DIAGNOSIS — M5441 Lumbago with sciatica, right side: Secondary | ICD-10-CM | POA: Diagnosis not present

## 2018-10-11 DIAGNOSIS — R279 Unspecified lack of coordination: Secondary | ICD-10-CM

## 2018-10-11 DIAGNOSIS — M6281 Muscle weakness (generalized): Secondary | ICD-10-CM

## 2018-10-11 MED FILL — FLUTICASONE PROP 50 MCG SPR: 50 | 30 days supply | Qty: 16 | Fill #2

## 2018-10-11 MED FILL — CYCLOBENZAPRINE 10 MG TAB: 10 | 30 days supply | Qty: 60 | Fill #2

## 2018-10-11 MED FILL — NAPROXEN 500 MG TABLET: 500 | 30 days supply | Qty: 60 | Fill #2

## 2018-10-11 MED FILL — DULoxetine HCL 60 MG CPEP: 60 | 30 days supply | Qty: 30 | Fill #1

## 2018-10-11 NOTE — Therapy (Signed)
[facility] [address], [place], [place], [address] Phone: [phone]   Fax:  [phone]  Physical Therapy Treatment  Patient Details  Name: Cheryl King MRN: [id number] Date of Birth: Feb 20, 1965 Referring Provider (PT): Charlott Rakes, MD   Encounter Date: 10/11/2018  PT End of Session - 10/11/18 1132    Visit Number  4    Date for PT Re-Evaluation  12/11/18    Authorization Type  medicaid - reauthorization today    Authorization - Visit Number  3    Authorization - Number of Visits  3    Activity Tolerance  Patient tolerated treatment well    Behavior During Therapy  [facility] for tasks assessed/performed       Past Medical History:  Diagnosis Date  . Acid reflux   . Allergy   . Carpal tunnel syndrome   . Depression   . Diverticulitis   . Hyperlipidemia   . Low back pain   . Neck pain   . 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 Sciatica   . Seasonal allergies     Past Surgical History:  Procedure Laterality Date  . ABDOMINAL SURGERY    . ANKLE SURGERY     lt.  Marland Kitchen PARTIAL HYSTERECTOMY    . right breast cyst removed     . URETHRAL DIVERTICULUM REPAIR     uretrral diverticultis    There were no vitals filed for this visit.  Subjective Assessment - 10/11/18 1106    Subjective  I had two days with less back pain and did not leak at all.  I moved some plants and then had pain after that.    Patient Stated Goals  less leakage; decrease pain    Currently in Pain?  Yes    Pain Score  7     Pain Location  Back    Pain Orientation  Lower    Multiple Pain Sites  No                    Pelvic Floor Special Questions - 10/11/18 0001    Pelvic Floor Internal Exam  pt informed and identity confirmed for informed consent given to perform internal soft tissue assess and treat    Exam Type  Vaginal    Strength  good squeeze, good lift, able to hold agaisnt strong resistance    Strength # of reps   5    Strength # of seconds  5        [facility] Adult PT Treatment/Exercise - 10/11/18 0001      Lumbar Exercises: Stretches   Active Hamstring Stretch  Right;Left;3 reps    Lower Trunk Rotation  3 reps;10 seconds    Piriformis Stretch  Right;Left;3 reps;20 seconds    Other Lumbar Stretch Exercise  IR hip stretch      Manual Therapy   Soft tissue mobilization  rectus abdominus attachment - distal and bilateral - tight and TTP - softened with STM    Internal Pelvic Floor  bulbocavernosis and pubococcygeus- able to release with gentle stretchLt tighter than Rt             PT Education - 10/11/18 1136    Education Details  Access Code: [email or web address]    Person(s) Educated  Patient    Methods  Explanation;Demonstration;Handout;Verbal cues    Comprehension  Verbalized understanding;Returned demonstration          PT Long Term Goals - 10/11/18 1127  PT LONG TERM GOAL #1   Title  Pt will report 50% less urinary leakage    Baseline  had 2 days without leakage;    Status  On-going      PT LONG TERM GOAL #2   Title  pt will be able to sustain pelvic floor contraction for at least 10 seconds in order to having reduced urinary leakage throughout a typical day    Baseline  5 seconds x 5 reps    Status  On-going      PT LONG TERM GOAL #3   Title  pt will be ind with advanced HEP for maintaining improved core and pelvic floor strength    Baseline  still learning    Status  On-going      PT LONG TERM GOAL #4   Title  Pt will report 50% reduction in low back pain    Baseline  was less for a couple of days after STM with PT    Status  On-going      PT LONG TERM GOAL #5   Title  Pt will not have to wear panty liners when going out due to improved pelvic strength    Status  On-going            Plan - 10/11/18 1206    Clinical Impression Statement  Pt continues to make progress with having a couple of days without leakage and decreased back pain.  Pt reports she has not had  a day without leakage since February.  Pt is learning the HEP. She has improved her strength and endurance of pelvic floor.  She will continue to benefit from skilled PT to address soft tissue impairments and work on establishing an HEP that will help her maintain strength as well as manage her pain for maximum function.    PT Treatment/Interventions  ADLs/Self Care Home Management;Cryotherapy;Electrical Stimulation;Ultrasound;Traction;Moist Heat;Therapeutic activities;Therapeutic exercise;Manual techniques;Patient/family education;Passive range of motion;Dry needling;Taping;Spinal Manipulations;Joint Manipulations;Biofeedback;Neuromuscular re-education    PT Next Visit Plan  f/u on safe positions for intercourse, core and pelvic floor strengthening, continue core and pelvic strengthening, biofeedback as needed    PT Home Exercise Plan  [email or web address]    Consulted and Agree with Plan of Care  Patient       Patient will benefit from skilled therapeutic intervention in order to improve the following deficits and impairments:  Difficulty walking, Increased muscle spasms, Decreased activity tolerance, Hypermobility, Pain, Postural dysfunction, Decreased strength, Decreased coordination, Increased fascial restricitons  Visit Diagnosis: Chronic bilateral low back pain with bilateral sciatica  Muscle weakness (generalized)  Unspecified lack of coordination  Cramp and spasm     Problem List Patient Active Problem List   Diagnosis Date Noted  . Hyperlipidemia 07/18/2017  . Ulnar neuropathy 04/18/2017  . GERD (gastroesophageal reflux disease) 10/19/2016  . Prediabetes 05/03/2016  . Cubital tunnel syndrome on right 03/01/2016  . Numbness and tingling of right arm 01/01/2016  . Chlamydia infection 01/27/2015  . Smoking 12/26/2013  . Chronic lower back pain 04/13/2013  . Sciatica 04/13/2013  . Chronic neck pain 04/13/2013  . Spondylosis of cervical spine 04/13/2013    Jule Ser,  PT 10/11/2018, 12:16 PM  [facility] [address] [place], [place], [address] Phone: [phone]   Fax:  [phone]  Name: Janna Booker MRN: [id number] Date of Birth: 07-Jul-1965

## 2018-10-11 NOTE — Patient Instructions (Signed)
Access Code: [email or web address]  URL: [email or web address]  Date: 10/11/2018  Prepared by: Jari Favre   Exercises  Supine Diaphragmatic Breathing with Pelvic Floor Lengthening - 10 reps - 1 sets - 3x daily - 7x weekly  Supine Piriformis Stretch - 3 reps - 1 sets - 30 sec hold - 1x daily - 7x weekly  Supine Double Knee to Chest - 3 reps - 1 sets - 30 sec hold - 1x daily - 7x weekly  Supine Bilateral Hip Internal Rotation Stretch - 3 reps - 1 sets - 30 sec hold - 1x daily - 7x weekly  Hooklying Active Hamstring Stretch - 3 reps - 1 sets - 30 sec hold - 2x daily - 7x weekly  Hooklying Small March - 10 reps - 1 sets - 10 sec hold - 1x daily - 7x weekly  Bent Knee Fallouts - 10 reps - 1 sets - 10 sec hold - 1x daily - 7x weekly

## 2018-10-19 ENCOUNTER — Other Ambulatory Visit: Payer: Self-pay

## 2018-10-19 ENCOUNTER — Ambulatory Visit: Payer: Medicaid Other | Attending: Family Medicine | Admitting: Physical Therapy

## 2018-10-19 DIAGNOSIS — M6281 Muscle weakness (generalized): Secondary | ICD-10-CM

## 2018-10-19 DIAGNOSIS — M5441 Lumbago with sciatica, right side: Secondary | ICD-10-CM | POA: Diagnosis not present

## 2018-10-19 DIAGNOSIS — R279 Unspecified lack of coordination: Secondary | ICD-10-CM | POA: Diagnosis not present

## 2018-10-19 DIAGNOSIS — M5442 Lumbago with sciatica, left side: Secondary | ICD-10-CM | POA: Insufficient documentation

## 2018-10-19 DIAGNOSIS — R252 Cramp and spasm: Secondary | ICD-10-CM | POA: Insufficient documentation

## 2018-10-19 DIAGNOSIS — G8929 Other chronic pain: Secondary | ICD-10-CM | POA: Diagnosis not present

## 2018-10-19 NOTE — Therapy (Signed)
[facility] [address], [place], [place], [address] Phone: [phone]   Fax:  [phone]  Physical Therapy Treatment  Patient Details  Name: Cheryl King MRN: [id number] Date of Birth: August 28, 1965 Referring Provider (PT): Charlott Rakes, MD   Encounter Date: 10/19/2018  PT End of Session - 10/19/18 1438    Visit Number  5    Date for PT Re-Evaluation  12/11/18    Authorization Type  medicaid - reauthorization today    Authorization - Visit Number  1    Authorization - Number of Visits  4    PT Start Time  1400    PT Stop Time  1440    PT Time Calculation (min)  40 min    Activity Tolerance  Patient tolerated treatment well    Behavior During Therapy  [facility] for tasks assessed/performed       Past Medical History:  Diagnosis Date  . Acid reflux   . Allergy   . Carpal tunnel syndrome   . Depression   . Diverticulitis   . Hyperlipidemia   . Low back pain   . Neck pain   . 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 Sciatica   . Seasonal allergies     Past Surgical History:  Procedure Laterality Date  . ABDOMINAL SURGERY    . ANKLE SURGERY     lt.  Marland Kitchen PARTIAL HYSTERECTOMY    . right breast cyst removed     . URETHRAL DIVERTICULUM REPAIR     uretrral diverticultis    There were no vitals filed for this visit.  Subjective Assessment - 10/19/18 1403    Subjective  I am having family and house issues.  Other than that the leakage is not much volume or as often.    Patient Stated Goals  less leakage; decrease pain    Currently in Pain?  Yes    Pain Score  7     Pain Location  Back    Pain Orientation  Lower    Pain Descriptors / Indicators  Spasm    Pain Type  Chronic pain    Pain Radiating Towards  Rt hip and Rt buttocks    Pain Onset  More than a month ago    Pain Frequency  Intermittent    Multiple Pain Sites  No                       [facility] Adult PT Treatment/Exercise - 10/19/18  0001      Lumbar Exercises: Stretches   Active Hamstring Stretch  Right;Left;3 reps    Active Hamstring Stretch Limitations  adductors with strap after hamstrings    Double Knee to Chest Stretch  3 reps;30 seconds    Lower Trunk Rotation  3 reps;10 seconds    Hip Flexor Stretch  Right;Left;3 reps;30 seconds   off mat with strap     Manual Therapy   Soft tissue mobilization  diaphragm and rectus abdominus release                  PT Long Term Goals - 10/11/18 1127      PT LONG TERM GOAL #1   Title  Pt will report 50% less urinary leakage    Baseline  had 2 days without leakage;    Status  On-going      PT LONG TERM GOAL #2   Title  pt will be able to sustain pelvic  floor contraction for at least 10 seconds in order to having reduced urinary leakage throughout a typical day    Baseline  5 seconds x 5 reps    Status  On-going      PT LONG TERM GOAL #3   Title  pt will be ind with advanced HEP for maintaining improved core and pelvic floor strength    Baseline  still learning    Status  On-going      PT LONG TERM GOAL #4   Title  Pt will report 50% reduction in low back pain    Baseline  was less for a couple of days after STM with PT    Status  On-going      PT LONG TERM GOAL #5   Title  Pt will not have to wear panty liners when going out due to improved pelvic strength    Status  On-going            Plan - 10/19/18 1531    Clinical Impression Statement  Pt responded well to treatment today.  She had <5/10 pain down from 7/10 intially.  Pt had tension in rectus abdominus and diaphragm Rt>Lt in upper quadrant.  Pt will benefit from skilled PT to continue to work on release soft tissue adhesions for improved muscle function.    Comorbidities  chronic pain of multiple regions, hysterectomy and urethral surgery    PT Treatment/Interventions  ADLs/Self Care Home Management;Cryotherapy;Electrical Stimulation;Ultrasound;Traction;Moist Heat;Therapeutic  activities;Therapeutic exercise;Manual techniques;Patient/family education;Passive range of motion;Dry needling;Taping;Spinal Manipulations;Joint Manipulations;Biofeedback;Neuromuscular re-education    PT Next Visit Plan  core and pelvic floor strengthening and stretching, f/u on new stretches    PT Home Exercise Plan  [email or web address]    Consulted and Agree with Plan of Care  Patient       Patient will benefit from skilled therapeutic intervention in order to improve the following deficits and impairments:  Difficulty walking, Increased muscle spasms, Decreased activity tolerance, Hypermobility, Pain, Postural dysfunction, Decreased strength, Decreased coordination, Increased fascial restricitons  Visit Diagnosis: Chronic bilateral low back pain with bilateral sciatica  Muscle weakness (generalized)  Unspecified lack of coordination  Cramp and spasm     Problem List Patient Active Problem List   Diagnosis Date Noted  . Hyperlipidemia 07/18/2017  . Ulnar neuropathy 04/18/2017  . GERD (gastroesophageal reflux disease) 10/19/2016  . Prediabetes 05/03/2016  . Cubital tunnel syndrome on right 03/01/2016  . Numbness and tingling of right arm 01/01/2016  . Chlamydia infection 01/27/2015  . Smoking 12/26/2013  . Chronic lower back pain 04/13/2013  . Sciatica 04/13/2013  . Chronic neck pain 04/13/2013  . Spondylosis of cervical spine 04/13/2013    Jule Ser, PT 10/19/2018, 3:54 PM  [facility] [address] [place], [place], [address] Phone: [phone]   Fax:  [phone]  Name: Cheryl King MRN: [id number] Date of Birth: [birth year]

## 2018-10-23 ENCOUNTER — Other Ambulatory Visit: Payer: Self-pay

## 2018-10-23 ENCOUNTER — Ambulatory Visit: Payer: Medicaid Other | Admitting: Physical Therapy

## 2018-10-23 ENCOUNTER — Encounter: Payer: Self-pay | Admitting: Physical Therapy

## 2018-10-23 DIAGNOSIS — M5442 Lumbago with sciatica, left side: Secondary | ICD-10-CM

## 2018-10-23 DIAGNOSIS — G8929 Other chronic pain: Secondary | ICD-10-CM | POA: Diagnosis not present

## 2018-10-23 DIAGNOSIS — R279 Unspecified lack of coordination: Secondary | ICD-10-CM | POA: Diagnosis not present

## 2018-10-23 DIAGNOSIS — R252 Cramp and spasm: Secondary | ICD-10-CM

## 2018-10-23 DIAGNOSIS — M5441 Lumbago with sciatica, right side: Secondary | ICD-10-CM | POA: Diagnosis not present

## 2018-10-23 DIAGNOSIS — M6281 Muscle weakness (generalized): Secondary | ICD-10-CM | POA: Diagnosis not present

## 2018-10-23 NOTE — Therapy (Signed)
[facility] [address], [place], [place], [address] Phone: [phone]   Fax:  [phone]  Physical Therapy Treatment  Patient Details  Name: Cheryl King MRN: [id number] Date of Birth: March 10, 1965 Referring Provider (PT): Charlott Rakes, MD   Encounter Date: 10/23/2018  PT End of Session - 10/23/18 1041    Visit Number  6    Date for PT Re-Evaluation  12/11/18    Authorization Type  medicaid - reauthorization today    PT Start Time  1017    PT Stop Time  1057    PT Time Calculation (min)  40 min    Activity Tolerance  Patient tolerated treatment well    Behavior During Therapy  [facility] for tasks assessed/performed       Past Medical History:  Diagnosis Date  . Acid reflux   . Allergy   . Carpal tunnel syndrome   . Depression   . Diverticulitis   . Hyperlipidemia   . Low back pain   . Neck pain   . 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 Sciatica   . Seasonal allergies     Past Surgical History:  Procedure Laterality Date  . ABDOMINAL SURGERY    . ANKLE SURGERY     lt.  Marland Kitchen PARTIAL HYSTERECTOMY    . right breast cyst removed     . URETHRAL DIVERTICULUM REPAIR     uretrral diverticultis    There were no vitals filed for this visit.  Subjective Assessment - 10/23/18 1038    Subjective  I am having a lot of pain from cleaning my Friday and Saturday.  Yesterday started having pain and today is worse, 10/10.  I had a lot of leakage after the last time I was here.    Patient Stated Goals  less leakage; decrease pain    Currently in Pain?  Yes    Pain Score  10-Worst pain ever    Pain Location  Abdomen    Pain Orientation  Lower    Pain Descriptors / Indicators  Cramping;Aching;Spasm    Pain Radiating Towards  radiates around the back    Pain Onset  More than a month ago    Pain Frequency  Intermittent    Aggravating Factors   lifting    Multiple Pain Sites  No                        [facility] Adult PT Treatment/Exercise - 10/23/18 0001      Lumbar Exercises: Stretches   Other Lumbar Stretch Exercise  blue pball roll out fo rlumbar stretch in sitting - 10x 10 sec      Lumbar Exercises: Standing   Other Standing Lumbar Exercises  shoulder ext back and forward strepping - TrA engaged - red band - 20x      Lumbar Exercises: Supine   Other Supine Lumbar Exercises  small march, UE overhead - TrA engaged - 20x      Lumbar Exercises: Quadruped   Other Quadruped Lumbar Exercises  modifoed quad with forearms on mat - TrA engaged 50%      Moist Heat Therapy   Number Minutes Moist Heat  15 Minutes   during STM   Moist Heat Location  Lumbar Spine   abdomen when lying prone     Manual Therapy   Soft tissue mobilization  diaphragm and rectus abdominus release, lumbar paraspinals  PT Long Term Goals - 10/11/18 1127      PT LONG TERM GOAL #1   Title  Pt will report 50% less urinary leakage    Baseline  had 2 days without leakage;    Status  On-going      PT LONG TERM GOAL #2   Title  pt will be able to sustain pelvic floor contraction for at least 10 seconds in order to having reduced urinary leakage throughout a typical day    Baseline  5 seconds x 5 reps    Status  On-going      PT LONG TERM GOAL #3   Title  pt will be ind with advanced HEP for maintaining improved core and pelvic floor strength    Baseline  still learning    Status  On-going      PT LONG TERM GOAL #4   Title  Pt will report 50% reduction in low back pain    Baseline  was less for a couple of days after STM with PT    Status  On-going      PT LONG TERM GOAL #5   Title  Pt will not have to wear panty liners when going out due to improved pelvic strength    Status  On-going            Plan - 10/23/18 1051    Clinical Impression Statement  Pt had a lot of tension in the lumbar paraspinals today and getting some cramping along  rectus abdominus.  Pt responded well to [facility] and was able to isolate TrA more after STM.  Pt was not sure if she was relaxing the pelvic floor muscles correcty.  She will benefit from skilled PT to continue to work on body awareness and pelvic floor musclecoordination using biofeeedback in order to improve her ability to control these muscles and imporve baldder control.    PT Treatment/Interventions  ADLs/Self Care Home Management;Cryotherapy;Electrical Stimulation;Ultrasound;Traction;Moist Heat;Therapeutic activities;Therapeutic exercise;Manual techniques;Patient/family education;Passive range of motion;Dry needling;Taping;Spinal Manipulations;Joint Manipulations;Biofeedback;Neuromuscular re-education    PT Next Visit Plan  biofeedback    PT Home Exercise Plan  [email or web address]    Consulted and Agree with Plan of Care  Patient       Patient will benefit from skilled therapeutic intervention in order to improve the following deficits and impairments:  Difficulty walking, Increased muscle spasms, Decreased activity tolerance, Hypermobility, Pain, Postural dysfunction, Decreased strength, Decreased coordination, Increased fascial restricitons  Visit Diagnosis: Chronic bilateral low back pain with bilateral sciatica  Muscle weakness (generalized)  Unspecified lack of coordination  Cramp and spasm     Problem List Patient Active Problem List   Diagnosis Date Noted  . Hyperlipidemia 07/18/2017  . Ulnar neuropathy 04/18/2017  . GERD (gastroesophageal reflux disease) 10/19/2016  . Prediabetes 05/03/2016  . Cubital tunnel syndrome on right 03/01/2016  . Numbness and tingling of right arm 01/01/2016  . Chlamydia infection 01/27/2015  . Smoking 12/26/2013  . Chronic lower back pain 04/13/2013  . Sciatica 04/13/2013  . Chronic neck pain 04/13/2013  . Spondylosis of cervical spine 04/13/2013    Jule Ser, PT 10/23/2018, 11:00 AM  [facility] [address] [place], [place], [address] Phone: [phone]   Fax:  [phone]  Name: Milayah Hesseltine MRN: [id number] Date of Birth: October 17, 1965

## 2018-10-30 ENCOUNTER — Ambulatory Visit: Payer: Medicaid Other | Admitting: Physical Therapy

## 2018-10-30 ENCOUNTER — Other Ambulatory Visit: Payer: Self-pay

## 2018-10-30 DIAGNOSIS — M6281 Muscle weakness (generalized): Secondary | ICD-10-CM | POA: Diagnosis not present

## 2018-10-30 DIAGNOSIS — R279 Unspecified lack of coordination: Secondary | ICD-10-CM | POA: Diagnosis not present

## 2018-10-30 DIAGNOSIS — M5441 Lumbago with sciatica, right side: Secondary | ICD-10-CM | POA: Diagnosis not present

## 2018-10-30 DIAGNOSIS — R252 Cramp and spasm: Secondary | ICD-10-CM

## 2018-10-30 DIAGNOSIS — M5442 Lumbago with sciatica, left side: Secondary | ICD-10-CM

## 2018-10-30 DIAGNOSIS — G8929 Other chronic pain: Secondary | ICD-10-CM | POA: Diagnosis not present

## 2018-10-30 NOTE — Therapy (Signed)
[facility] [address], [place], [place], [address] Phone: [phone]   Fax:  [phone]  Physical Therapy Treatment  Patient Details  Name: Cheryl King MRN: [id number] Date of Birth: 1965-02-22 Referring Provider (PT): Charlott Rakes, MD   Encounter Date: 10/30/2018  PT End of Session - 10/30/18 1016    Visit Number  7    Date for PT Re-Evaluation  12/11/18    Authorization - Visit Number  2    Authorization - Number of Visits  4    PT Start Time  [id number]    PT Stop Time  1055    PT Time Calculation (min)  40 min    Activity Tolerance  Patient tolerated treatment well    Behavior During Therapy  [facility] for tasks assessed/performed       Past Medical History:  Diagnosis Date  . Acid reflux   . Allergy   . Carpal tunnel syndrome   . Depression   . Diverticulitis   . Hyperlipidemia   . Low back pain   . Neck pain   . 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 Sciatica   . Seasonal allergies     Past Surgical History:  Procedure Laterality Date  . ABDOMINAL SURGERY    . ANKLE SURGERY     lt.  Marland Kitchen PARTIAL HYSTERECTOMY    . right breast cyst removed     . URETHRAL DIVERTICULUM REPAIR     uretrral diverticultis    There were no vitals filed for this visit.  Subjective Assessment - 10/30/18 1016    Subjective  I am sore today.  I think it is the weather and lifting plants.  I had less leakage.  Had leakage 2-3 days last week.  Lately coughing hasn't caused as much leakage.    Pertinent History  urethra surgery 10+ years ago, hysterectomy, chronic back and hip pain    Patient Stated Goals  less leakage; decrease pain    Currently in Pain?  Yes    Pain Score  7     Pain Location  Back    Pain Orientation  Right;Left    Pain Descriptors / Indicators  Spasm    Pain Type  Chronic pain    Pain Radiating Towards  down into the buttocks    Pain Onset  More than a month ago    Aggravating Factors   rainy  weather, lifting plants and has been sore ever since    Multiple Pain Sites  No                       [facility] Adult PT Treatment/Exercise - 10/30/18 0001      Neuro Re-ed    Neuro Re-ed Details   biofeedback - 4 sec contract and 8 sec relax supine; up the ramp to level 8 supine; up the ramp to level 12 (ableto go to appx level 10 (sitting)                  PT Long Term Goals - 10/11/18 1127      PT LONG TERM GOAL #1   Title  Pt will report 50% less urinary leakage    Baseline  had 2 days without leakage;    Status  On-going      PT LONG TERM GOAL #2   Title  pt will be able to sustain pelvic floor contraction for at least 10 seconds in order  to having reduced urinary leakage throughout a typical day    Baseline  5 seconds x 5 reps    Status  On-going      PT LONG TERM GOAL #3   Title  pt will be ind with advanced HEP for maintaining improved core and pelvic floor strength    Baseline  still learning    Status  On-going      PT LONG TERM GOAL #4   Title  Pt will report 50% reduction in low back pain    Baseline  was less for a couple of days after STM with PT    Status  On-going      PT LONG TERM GOAL #5   Title  Pt will not have to wear panty liners when going out due to improved pelvic strength    Status  On-going            Plan - 10/30/18 1045    Clinical Impression Statement  Pt did much better at relaxing and not holding her breath during exercises today.  She was able to do strengthening exercises using biofeedback and hold for 10 sec by gradually increasing her contraction up the "ramp".  Pt had more difficulty in sitting but showed improvements after the first minute.  Pt will continue to benefit from skilled PT to work on pelvic floor strength and coordination.    PT Treatment/Interventions  ADLs/Self Care Home Management;Cryotherapy;Electrical Stimulation;Ultrasound;Traction;Moist Heat;Therapeutic activities;Therapeutic exercise;Manual  techniques;Patient/family education;Passive range of motion;Dry needling;Taping;Spinal Manipulations;Joint Manipulations;Biofeedback;Neuromuscular re-education    PT Next Visit Plan  biofeedback possilby, pelvic floor with longer holds    PT Home Exercise Plan  [email or web address]    Consulted and Agree with Plan of Care  Patient       Patient will benefit from skilled therapeutic intervention in order to improve the following deficits and impairments:  Difficulty walking, Increased muscle spasms, Decreased activity tolerance, Hypermobility, Pain, Postural dysfunction, Decreased strength, Decreased coordination, Increased fascial restricitons  Visit Diagnosis: Chronic bilateral low back pain with bilateral sciatica  Muscle weakness (generalized)  Unspecified lack of coordination  Cramp and spasm     Problem List Patient Active Problem List   Diagnosis Date Noted  . Hyperlipidemia 07/18/2017  . Ulnar neuropathy 04/18/2017  . GERD (gastroesophageal reflux disease) 10/19/2016  . Prediabetes 05/03/2016  . Cubital tunnel syndrome on right 03/01/2016  . Numbness and tingling of right arm 01/01/2016  . Chlamydia infection 01/27/2015  . Smoking 12/26/2013  . Chronic lower back pain 04/13/2013  . Sciatica 04/13/2013  . Chronic neck pain 04/13/2013  . Spondylosis of cervical spine 04/13/2013    Jule Ser, PT 10/30/2018, 10:59 AM  [facility] [address], [place], [place], [address] Phone: [phone]   Fax:  [phone]  Name: Cheryl King MRN: [id number] Date of Birth: 1965/07/06

## 2018-10-31 MED FILL — ATORVASTATIN CALCIUM 20 MG: 20 | 30 days supply | Qty: 30 | Fill #0

## 2018-11-06 ENCOUNTER — Other Ambulatory Visit: Payer: Self-pay

## 2018-11-06 ENCOUNTER — Encounter: Payer: Self-pay | Admitting: Physical Therapy

## 2018-11-06 ENCOUNTER — Ambulatory Visit: Payer: Medicaid Other | Admitting: Physical Therapy

## 2018-11-06 DIAGNOSIS — M5442 Lumbago with sciatica, left side: Secondary | ICD-10-CM

## 2018-11-06 DIAGNOSIS — M5441 Lumbago with sciatica, right side: Secondary | ICD-10-CM | POA: Diagnosis not present

## 2018-11-06 DIAGNOSIS — G8929 Other chronic pain: Secondary | ICD-10-CM

## 2018-11-06 DIAGNOSIS — M6281 Muscle weakness (generalized): Secondary | ICD-10-CM

## 2018-11-06 DIAGNOSIS — R252 Cramp and spasm: Secondary | ICD-10-CM | POA: Diagnosis not present

## 2018-11-06 DIAGNOSIS — R279 Unspecified lack of coordination: Secondary | ICD-10-CM

## 2018-11-06 NOTE — Patient Instructions (Signed)
Updates to Access Code: [email or web address]

## 2018-11-06 NOTE — Therapy (Addendum)
[facility] [address], [place], [place], [address] Phone: [phone]   Fax:  [phone]  Physical Therapy Treatment  Patient Details  Name: Cheryl King MRN: [id number] Date of Birth: [birth year] Referring Provider (PT): Charlott Rakes, MD   Encounter Date: 11/06/2018  PT End of Session - 11/06/18 1017    Visit Number  8    Date for PT Re-Evaluation  12/11/18    Authorization Type  medicaid    Authorization - Visit Number  4    Authorization - Number of Visits  4    PT Start Time  3361    PT Stop Time  1100    PT Time Calculation (min)  42 min    Activity Tolerance  Patient tolerated treatment well    Behavior During Therapy  [facility] for tasks assessed/performed       Past Medical History:  Diagnosis Date  . Acid reflux   . Allergy   . Carpal tunnel syndrome   . Depression   . Diverticulitis   . Hyperlipidemia   . Low back pain   . Neck pain   . Sacroiliac inflammation ([place])   . Sciatica   . Seasonal allergies     Past Surgical History:  Procedure Laterality Date  . ABDOMINAL SURGERY    . ANKLE SURGERY     lt.  Marland Kitchen PARTIAL HYSTERECTOMY    . right breast cyst removed     . URETHRAL DIVERTICULUM REPAIR     uretrral diverticultis    There were no vitals filed for this visit.  Subjective Assessment - 11/06/18 1023    Subjective  I noticed I had more leakage after doing so much last week.  I am cleaning out clothes and getting winter clothes out. I am at least 50% better with less leakage.  My back pain is always there and I don't think that is ever going to get better.    Patient Stated Goals  less leakage; decrease pain    Currently in Pain?  Yes    Pain Score  8     Pain Location  Back    Multiple Pain Sites  No                       [facility] Adult PT Treatment/Exercise - 11/06/18 0001      Neuro Re-ed    Neuro Re-ed Details   educated in eccentric pelvic floor  contraction - cues to engage core and posture control throughout      Lumbar Exercises: Quadruped   Straight Leg Raises Limitations  engaging TrA with gentle sacral strech in sidelying    Other Quadruped Lumbar Exercises  modifoed quad with forearms on mat - TrA engaged 50% - felt better after this exercise; same thing with toes in for pelvic floor isolation      Manual Therapy   Soft tissue mobilization  diaphragm and rectus abdominus release, lumbar paraspinals, sacral distraction             PT Education - 11/06/18 1210    Education Details  Access Code: [email or web address]    Person(s) Educated  Patient    Methods  Explanation;Demonstration;Handout;Verbal cues;Tactile cues    Comprehension  Verbalized understanding;Returned demonstration          PT Long Term Goals - 11/06/18 1050      PT LONG TERM GOAL #1   Title  Pt will report 50% less urinary  leakage    Baseline  at least 50% improved    Status  Achieved      PT LONG TERM GOAL #2   Title  pt will be able to sustain pelvic floor contraction for at least 10 seconds in order to having reduced urinary leakage throughout a typical day    Baseline  able to hold 10 seconds    Status  Achieved      PT LONG TERM GOAL #3   Title  pt will be ind with advanced HEP for maintaining improved core and pelvic floor strength    Status  Achieved      PT LONG TERM GOAL #4   Title  Pt will report 50% reduction in low back pain    Baseline  no change    Status  Not Met      PT LONG TERM GOAL #5   Title  Pt will not have to wear panty liners when going out due to improved pelvic strength    Baseline  I don't in the house, but I do when I go out    Status  Partially Met            Plan - 11/06/18 1207    Clinical Impression Statement  Pt has met most of her goals.  She has not made progress with back pain.  At this time, patient is doing well with her HEP and is expected to be able to progress herself with the exercises at home.   Pt will discharge from skilled PT today.    PT Treatment/Interventions  ADLs/Self Care Home Management;Cryotherapy;Electrical Stimulation;Ultrasound;Traction;Moist Heat;Therapeutic activities;Therapeutic exercise;Manual techniques;Patient/family education;Passive range of motion;Dry needling;Taping;Spinal Manipulations;Joint Manipulations;Biofeedback;Neuromuscular re-education    PT Next Visit Plan  discharged today    PT Home Exercise Plan  [email or web address]    Consulted and Agree with Plan of Care  Patient       Patient will benefit from skilled therapeutic intervention in order to improve the following deficits and impairments:  Difficulty walking, Increased muscle spasms, Decreased activity tolerance, Hypermobility, Pain, Postural dysfunction, Decreased strength, Decreased coordination, Increased fascial restricitons  Visit Diagnosis: Chronic bilateral low back pain with bilateral sciatica  Muscle weakness (generalized)  Unspecified lack of coordination  Cramp and spasm     Problem List Patient Active Problem List   Diagnosis Date Noted  . Hyperlipidemia 07/18/2017  . Ulnar neuropathy 04/18/2017  . GERD (gastroesophageal reflux disease) 10/19/2016  . Prediabetes 05/03/2016  . Cubital tunnel syndrome on right 03/01/2016  . Numbness and tingling of right arm 01/01/2016  . Chlamydia infection 01/27/2015  . Smoking 12/26/2013  . Chronic lower back pain 04/13/2013  . Sciatica 04/13/2013  . Chronic neck pain 04/13/2013  . Spondylosis of cervical spine 04/13/2013    Jule Ser, PT 11/06/2018, 12:20 PM  [facility] [address], [place], [place], [address] Phone: [phone]   Fax:  [phone]  Name: Cheryl King MRN: [id number] Date of Birth: September 23, 1965  PHYSICAL THERAPY DISCHARGE SUMMARY  Visits from Start of Care: 8  Current functional level related to goals / functional outcomes: See above    Remaining deficits: See above   Education / Equipment: HEP  Plan: Patient agrees to discharge.  Patient goals were partially met. Patient is being discharged due to meeting the stated rehab goals.  ?????    [employer], PT 11/06/18 12:24 PM

## 2018-11-13 MED FILL — DULoxetine HCL 60 MG CPEP: 60 | 30 days supply | Qty: 30 | Fill #2

## 2018-11-13 MED FILL — BUTALB-ACETAMIN-CAFF 50-325: 50-325-40 | 8 days supply | Qty: 30 | Fill #1

## 2018-11-20 MED FILL — NAPROXEN 500 MG TABLET: 500 | 30 days supply | Qty: 60 | Fill #3

## 2018-11-20 MED FILL — CYCLOBENZAPRINE 10 MG TAB: 10 | 30 days supply | Qty: 60 | Fill #0

## 2018-12-04 MED FILL — ATORVASTATIN CALCIUM 20 MG: 20 | 30 days supply | Qty: 30 | Fill #1

## 2018-12-07 ENCOUNTER — Other Ambulatory Visit: Payer: Self-pay

## 2018-12-07 ENCOUNTER — Other Ambulatory Visit: Payer: Self-pay | Admitting: Family Medicine

## 2018-12-07 DIAGNOSIS — G44209 Tension-type headache, unspecified, not intractable: Secondary | ICD-10-CM

## 2018-12-07 MED ORDER — BUTALBITAL-APAP-CAFFEINE 50-325-40 MG PO TABS: 2.0000 | ORAL_TABLET | Freq: Two times a day (BID) | ORAL | 1 refills | Status: DC | PRN

## 2018-12-11 ENCOUNTER — Other Ambulatory Visit: Payer: Self-pay | Admitting: Family Medicine

## 2018-12-11 DIAGNOSIS — G44209 Tension-type headache, unspecified, not intractable: Secondary | ICD-10-CM

## 2018-12-11 MED FILL — DULoxetine HCL 60 MG CPEP: 60 | 30 days supply | Qty: 30 | Fill #0

## 2018-12-21 ENCOUNTER — Other Ambulatory Visit: Payer: Self-pay | Admitting: Family Medicine

## 2018-12-21 DIAGNOSIS — R0982 Postnasal drip: Secondary | ICD-10-CM

## 2018-12-22 MED FILL — FLUTICASONE PROP 50 MCG SPR: 50 | 30 days supply | Qty: 16 | Fill #0

## 2018-12-27 ENCOUNTER — Other Ambulatory Visit (INDEPENDENT_AMBULATORY_CARE_PROVIDER_SITE_OTHER): Payer: Self-pay | Admitting: Specialist

## 2018-12-27 MED FILL — CYCLOBENZAPRINE 10 MG TAB: 10 | 30 days supply | Qty: 60 | Fill #1

## 2018-12-28 ENCOUNTER — Other Ambulatory Visit (INDEPENDENT_AMBULATORY_CARE_PROVIDER_SITE_OTHER): Payer: Self-pay | Admitting: Specialist

## 2018-12-28 MED FILL — NAPROXEN 500 MG TABLET: 500 | 30 days supply | Qty: 60 | Fill #0

## 2019-01-03 ENCOUNTER — Other Ambulatory Visit: Payer: Self-pay | Admitting: Family Medicine

## 2019-01-03 ENCOUNTER — Other Ambulatory Visit: Payer: Self-pay

## 2019-01-03 ENCOUNTER — Encounter: Payer: Self-pay | Admitting: Family Medicine

## 2019-01-03 ENCOUNTER — Ambulatory Visit: Payer: Medicaid Other | Attending: Family Medicine | Admitting: Family Medicine

## 2019-01-03 VITALS — BP 97/63 | HR 69 | Temp 98.2°F | Ht 67.0 in | Wt 141.0 lb

## 2019-01-03 DIAGNOSIS — M5442 Lumbago with sciatica, left side: Secondary | ICD-10-CM | POA: Diagnosis not present

## 2019-01-03 DIAGNOSIS — R7303 Prediabetes: Secondary | ICD-10-CM | POA: Diagnosis not present

## 2019-01-03 DIAGNOSIS — G44209 Tension-type headache, unspecified, not intractable: Secondary | ICD-10-CM | POA: Diagnosis not present

## 2019-01-03 DIAGNOSIS — F329 Major depressive disorder, single episode, unspecified: Secondary | ICD-10-CM | POA: Diagnosis not present

## 2019-01-03 DIAGNOSIS — K219 Gastro-esophageal reflux disease without esophagitis: Secondary | ICD-10-CM | POA: Diagnosis not present

## 2019-01-03 DIAGNOSIS — Z720 Tobacco use: Secondary | ICD-10-CM | POA: Insufficient documentation

## 2019-01-03 DIAGNOSIS — Z79899 Other long term (current) drug therapy: Secondary | ICD-10-CM | POA: Diagnosis not present

## 2019-01-03 DIAGNOSIS — E785 Hyperlipidemia, unspecified: Secondary | ICD-10-CM | POA: Diagnosis not present

## 2019-01-03 DIAGNOSIS — L309 Dermatitis, unspecified: Secondary | ICD-10-CM | POA: Insufficient documentation

## 2019-01-03 DIAGNOSIS — G5600 Carpal tunnel syndrome, unspecified upper limb: Secondary | ICD-10-CM | POA: Insufficient documentation

## 2019-01-03 DIAGNOSIS — G8929 Other chronic pain: Secondary | ICD-10-CM | POA: Diagnosis not present

## 2019-01-03 DIAGNOSIS — M5441 Lumbago with sciatica, right side: Secondary | ICD-10-CM | POA: Diagnosis not present

## 2019-01-03 DIAGNOSIS — Z791 Long term (current) use of non-steroidal anti-inflammatories (NSAID): Secondary | ICD-10-CM | POA: Diagnosis not present

## 2019-01-03 DIAGNOSIS — Z7951 Long term (current) use of inhaled steroids: Secondary | ICD-10-CM | POA: Insufficient documentation

## 2019-01-03 MED ORDER — AMITRIPTYLINE HCL 25 MG PO TABS: 25.0000 mg | ORAL_TABLET | Freq: Every day | ORAL | 2 refills | Status: DC

## 2019-01-03 MED ORDER — AMITRIPTYLINE HCL 10 MG PO TABS: 10.0000 mg | ORAL_TABLET | Freq: Every day | ORAL | 3 refills | Status: DC

## 2019-01-03 MED ORDER — TRIAMCINOLONE ACETONIDE 0.1 % EX CREA: 1.0000 "application " | TOPICAL_CREAM | Freq: Two times a day (BID) | CUTANEOUS | 1 refills | Status: AC

## 2019-01-03 MED ORDER — KETOROLAC TROMETHAMINE 60 MG/2ML IM SOLN
60.0000 mg | Freq: Once | INTRAMUSCULAR | Status: AC
  Administered 2019-01-03: 60 mg via INTRAMUSCULAR

## 2019-01-03 MED FILL — AMITRIPTYLINE HCL 10 MG TAB: 10 | 90 days supply | Qty: 90 | Fill #0

## 2019-01-03 MED FILL — TRIAMCINOLONE ACETONIDE 0.1: 0.1 | 30 days supply | Qty: 45 | Fill #0

## 2019-01-03 MED FILL — ATORVASTATIN CALCIUM 20 MG: 20 | 30 days supply | Qty: 30 | Fill #2

## 2019-01-03 NOTE — Patient Instructions (Signed)
Tension Headache, Adult A tension headache is pain, pressure, or aching in your head. Tension headaches can last from 30 minutes to several days. Follow these instructions at home: Managing pain  Take over-the-counter and prescription medicines only as told by your doctor.  When you have a headache, lie down in a dark, quiet room.  If told, put ice on your head and neck: ? Put ice in a plastic bag. ? Place a towel between your skin and the bag. ? Leave the ice on for 20 minutes, 2-3 times a day.  If told, put heat on the back of your neck. Do this as often as your doctor tells you to. Use the kind of heat that your doctor recommends, such as a moist heat pack or a heating pad. ? Place a towel between your skin and the heat. ? Leave the heat on for 20-30 minutes. ? Remove the heat if your skin turns bright red. Eating and drinking  Eat meals on a regular schedule.  Watch how much alcohol you drink: ? If you are a woman and are not pregnant, do not drink more than 1 drink a day. ? If you are a man, do not drink more than 2 drinks a day.  Drink enough fluid to keep your pee (urine) pale yellow.  Do not use a lot of caffeine, or stop using caffeine. Lifestyle  Get enough sleep. Get 7-9 hours of sleep each night. Or get the amount of sleep that your doctor tells you to.  At bedtime, remove all electronic devices from your room. Examples of electronic devices are computers, phones, and tablets.  Find ways to lessen your stress. Some things that can lessen stress are: ? Exercise. ? Deep breathing. ? Yoga. ? Music. ? Positive thoughts.  Sit up straight. Do not tighten (tense) your muscles.  Do not use any products that have nicotine or tobacco in them, such as cigarettes and e-cigarettes. If you need help quitting, ask your doctor. General instructions   Keep all follow-up visits as told by your doctor. This is important.  Avoid things that can bring on headaches. Keep a  journal to find out if certain things bring on headaches. For example, write down: ? What you eat and drink. ? How much sleep you get. ? Any change to your diet or medicines. Contact a doctor if:  Your headache does not get better.  Your headache comes back.  You have a headache and sounds, light, or smells bother you.  You feel sick to your stomach (nauseous) or you throw up (vomit).  Your stomach hurts. Get help right away if:  You suddenly get a very bad headache along with any of these: ? A stiff neck. ? Feeling sick to your stomach. ? Throwing up. ? Feeling weak. ? Trouble seeing. ? Feeling short of breath. ? A rash. ? Feeling unusually sleepy. ? Trouble speaking. ? Pain in your eye or ear. ? Trouble walking or balancing. ? Feeling like you will pass out (faint). ? Passing out. Summary  A tension headache is pain, pressure, or aching in your head.  Tension headaches can last from 30 minutes to several days.  Lifestyle changes and medicines may help relieve pain. This information is not intended to replace advice given to you by your health care provider. Make sure you discuss any questions you have with your health care provider. Document Released: 03/31/2009 Document Revised: 12/17/2016 Document Reviewed: 04/16/2016 Elsevier Patient Education  2020 Elsevier   Inc.  

## 2019-01-03 NOTE — Progress Notes (Signed)
Subjective:  Patient ID: Cheryl King, female    DOB: 06/28/1965  Age: 53 y.o. MRN: [id number]  CC: Headache   HPI Cheryl King 53 year old female with Medical history significant for Prediabetes (diet controlled A1c 5.8) chronic low back pain with sciatica, GERD, hyperlipidemia tobacco abuse here for a follow-up visit. At her last visit 3 months ago she was treated for tension headaches with Fioricet prescribed but she informs me today headaches occur daily and is not improved by the use of 1 tablet of Fioricet but when she takes 2 tablets symptoms subside for 30 days only to return the next day.  She denies presence of photophobia, phonophobia, auras.  Also has Flonase which she uses as well as an antihistamine.  At the moment she currently has a headache and this is described as a 10/10.  She is requesting a prescription for triamcinolone cream which she uses for chronic rash. Her back continues to hurt and she completed a session of pelvic floor rehab with no improvement in her symptoms.  She endorses compliance with Cymbalta, Flexeril and Lyrica and her orthopedic is Dr. Louanne Skye.  Past Medical History:  Diagnosis Date  . Acid reflux   . Allergy   . Carpal tunnel syndrome   . Depression   . Diverticulitis   . Hyperlipidemia   . Low back pain   . Neck pain   . Sacroiliac inflammation ([place])   . Sciatica   . Seasonal allergies     Past Surgical History:  Procedure Laterality Date  . ABDOMINAL SURGERY    . ANKLE SURGERY     lt.  Marland Kitchen PARTIAL HYSTERECTOMY    . right breast cyst removed     . URETHRAL DIVERTICULUM REPAIR     uretrral diverticultis    Family History  Problem Relation Age of Onset  . Healthy Mother   . Other Father        Unsure of medical history  . Asthma Maternal Aunt   . Diabetes Maternal Aunt   . Cancer Maternal Aunt   . Breast cancer Maternal Aunt   . Hypertension Maternal Grandmother   . Colon cancer Neg Hx   . Pancreatic cancer Neg  Hx   . Rectal cancer Neg Hx   . Stomach cancer Neg Hx   . Colon polyps Neg Hx   . Esophageal cancer Neg Hx     Allergies  Allergen Reactions  . Percocet [Oxycodone-Acetaminophen] Nausea Only  . Vicodin [Hydrocodone-Acetaminophen] Nausea Only  . Penicillins Rash    Outpatient Medications Prior to Visit  Medication Sig Dispense Refill  . atorvastatin (LIPITOR) 20 MG tablet Take 1 tablet (20 mg total) by mouth daily. 90 tablet 1  . butalbital-acetaminophen-caffeine (FIORICET) 50-325-40 MG tablet TAKE 2 TABLETS BY MOUTH EVERY 12 (TWELVE) HOURS AS NEEDED FOR HEADACHE. 30 tablet 1  . cetirizine (ZYRTEC) 10 MG tablet Take 1 tablet (10 mg total) by mouth daily. 90 tablet 1  . cyclobenzaprine (FLEXERIL) 10 MG tablet Take 1 tablet (10 mg total) by mouth 2 (two) times daily as needed for muscle spasms. 60 tablet 2  . diclofenac sodium (VOLTAREN) 1 % GEL Apply 2 g topically 4 (four) times daily. 5 Tube 2  . DULoxetine (CYMBALTA) 60 MG capsule Take 1 capsule (60 mg total) by mouth daily. 30 capsule 6  . fluticasone (FLONASE) 50 MCG/ACT nasal spray PLACE 2 SPRAYS INTO BOTH NOSTRILS DAILY. 16 g 2  . Lactobacillus CAPS Take 1 capsule by mouth daily.    Marland Kitchen  Multiple Vitamin (MULTIVITAMIN WITH MINERALS) TABS tablet Take 1 tablet by mouth daily.    . naproxen (NAPROSYN) 500 MG tablet TAKE 1 TABLET BY MOUTH 2 TIMES DAILY WITH A MEAL. 60 tablet 3  . ondansetron (ZOFRAN) 4 MG tablet TAKE 1 TABLET BY MOUTH EVERY 8 HOURS AS NEEDED FOR NAUSEA OR VOMITING. 60 tablet 1  . pregabalin (LYRICA) 50 MG capsule TAKE 1 CAPSULE BY MOUTH EVERYDAY AT BEDTIME 30 capsule 2  . RESTASIS 0.05 % ophthalmic emulsion   3  . traMADol (ULTRAM) 50 MG tablet Take by mouth every 6 (six) hours as needed.    . triamcinolone cream (KENALOG) 0.1 % Apply 1 application topically 2 (two) times daily. 45 g 1  . metroNIDAZOLE (METROGEL) 0.75 % vaginal gel Place 1 Applicatorful vaginally 2 (two) times daily. (Patient not taking: Reported on  06/19/2018) 70 g 0  . nicotine (NICODERM CQ) 14 mg/24hr patch Place 1 patch (14 mg total) onto the skin daily. For 4 weeks then 7mg /24hr daily for  4 weeks (Patient not taking: Reported on 09/19/2018) 28 patch 1  . predniSONE (STERAPRED UNI-PAK 21 TAB) 10 MG (21) TBPK tablet Take by mouth daily. Take 6 tabs by mouth daily for 2 days, then 5 tabs for 2 days, then 4 tabs for 2 days, then 3 tabs for 2 days, 2 tabs for 2 days, then 1 tab by mouth daily for 2 days (Patient not taking: Reported on 06/19/2018) 42 tablet 0   Facility-Administered Medications Prior to Visit  Medication Dose Route Frequency Provider Last Rate Last Admin  . 0.9 %  sodium chloride infusion  500 mL Intravenous Once Nelida Meuse III, MD         ROS Review of Systems  Constitutional: Negative for activity change, appetite change and fatigue.  HENT: Negative for congestion, sinus pressure and sore throat.   Eyes: Negative for visual disturbance.  Respiratory: Negative for cough, chest tightness, shortness of breath and wheezing.   Cardiovascular: Negative for chest pain and palpitations.  Gastrointestinal: Negative for abdominal distention, abdominal pain and constipation.  Endocrine: Negative for polydipsia.  Genitourinary: Negative for dysuria and frequency.  Musculoskeletal: Positive for back pain. Negative for arthralgias.  Skin: Negative for rash.  Neurological: Positive for headaches. Negative for tremors, light-headedness and numbness.  Hematological: Does not bruise/bleed easily.  Psychiatric/Behavioral: Negative for agitation and behavioral problems.    Objective:  BP 97/63   Pulse 69   Temp 98.2 F (36.8 C) (Oral)   Ht 5\' 7"  (1.702 m)   Wt 141 lb (64 kg)   SpO2 93%   BMI 22.08 kg/m   BP/Weight 01/03/2019 06/19/2018 [id number]  Systolic BP 97 [id number]  Diastolic BP 63 66 68  Wt. (Lbs) 141 140.2 147.2  BMI 22.08 21.96 23.05      Physical Exam Constitutional:      Appearance: She is well-developed.  She is ill-appearing.  Neck:     Vascular: No JVD.  Cardiovascular:     Rate and Rhythm: Normal rate.     Heart sounds: Normal heart sounds. No murmur.  Pulmonary:     Effort: Pulmonary effort is normal.     Breath sounds: Normal breath sounds. No wheezing or rales.  Chest:     Chest wall: No tenderness.  Abdominal:     General: Bowel sounds are normal. There is no distension.     Palpations: Abdomen is soft. There is no mass.     Tenderness: There is  no abdominal tenderness.  Musculoskeletal:        General: Normal range of motion.     Right lower leg: No edema.     Left lower leg: No edema.     Comments: Lumbar spine tenderness to palpation  Neurological:     Mental Status: She is alert and oriented to person, place, and time.  Psychiatric:        Mood and Affect: Mood normal.     CMP Latest Ref Rng & Units 09/20/2018 03/09/2018 04/18/2017  Glucose 65 - 99 mg/dL 92 86 86  BUN 6 - 24 mg/dL 17 18 14   Creatinine 0.57 - 1.00 mg/dL 0.72 0.69 0.67  Sodium 134 - 144 mmol/L 141 142 140  Potassium 3.5 - 5.2 mmol/L 4.9 4.5 4.9  Chloride 96 - 106 mmol/L 102 102 102  CO2 20 - 29 mmol/L 27 23 27   Calcium 8.7 - 10.2 mg/dL 9.7 9.7 9.9  Total Protein 6.0 - 8.5 g/dL 6.7 7.1 7.0  Total Bilirubin 0.0 - 1.2 mg/dL 0.3 0.4 0.3  Alkaline Phos 39 - 117 IU/L 113 118(H) 111  AST 0 - 40 IU/L 28 18 22   ALT 0 - 32 IU/L 27 10 19     Lipid Panel     Component Value Date/Time   CHOL 202 (H) 09/20/2018 1011   TRIG 83 09/20/2018 1011   HDL 67 09/20/2018 1011   CHOLHDL 3.0 09/20/2018 1011   CHOLHDL 3.2 12/26/2013 1319   VLDL 18 12/26/2013 1319   LDLCALC 120 (H) 09/20/2018 1011    CBC    Component Value Date/Time   WBC 8.6 04/18/2017 1003   WBC 9.3 04/13/2013 1152   RBC 4.74 04/18/2017 1003   RBC 4.94 04/13/2013 1152   HGB 14.9 04/18/2017 1003   HCT 44.7 04/18/2017 1003   PLT 236 04/18/2017 1003   MCV 94 04/18/2017 1003   MCH 31.4 04/18/2017 1003   MCH 32.2 04/13/2013 1152   MCHC 33.3  04/18/2017 1003   MCHC 34.6 04/13/2013 1152   RDW 14.5 04/18/2017 1003   LYMPHSABS 2.6 04/18/2017 1003   MONOABS 0.9 04/13/2013 1152   EOSABS 0.1 04/18/2017 1003   BASOSABS 0.0 04/18/2017 1003    Lab Results  Component Value Date   HGBA1C 5.6 09/20/2018    Assessment & Plan:   1. Chronic midline low back pain with bilateral sciatica Uncontrolled Completed pelvic floor rehab with no improvement in symptoms She is currently on Lyrica, Cymbalta, Flexeril Also follows up with orthopedics  2. Dermatitis Requesting refill of triamcinolone - triamcinolone cream (KENALOG) 0.1 %; Apply 1 application topically 2 (two) times daily.  Dispense: 45 g; Refill: 1  3. Tension headache Headaches are occurring daily but do not seem to be migraine type headaches Due to excessive need for Fioricet that we will commence her on a prophylactic medication She declines Topamax due to side effect of weight loss and is also not enthusiastic about the dry mouth side effects of Elavil but would rather do Elavil and Topamax Use Fioricet for breakthrough headaches Toradol administered - amitriptyline (ELAVIL) 10 MG tablet; Take 1 tablet (10 mg total) by mouth at bedtime.  Dispense: 30 tablet; Refill: 3    Meds ordered this encounter  Medications  . triamcinolone cream (KENALOG) 0.1 %    Sig: Apply 1 application topically 2 (two) times daily.    Dispense:  45 g    Refill:  1  . DISCONTD: amitriptyline (ELAVIL) 25 MG tablet  Sig: Take 1 tablet (25 mg total) by mouth at bedtime.    Dispense:  30 tablet    Refill:  2  . amitriptyline (ELAVIL) 10 MG tablet    Sig: Take 1 tablet (10 mg total) by mouth at bedtime.    Dispense:  30 tablet    Refill:  3    Please discontinue 25 mg    Follow-up: Return in about 3 months (around 04/03/2019) for Chronic medical conditions.       Charlott Rakes, MD, FAAFP. [facility] [place], [place]   01/03/2019,  3:11 PM

## 2019-01-03 NOTE — Progress Notes (Signed)
Patient has had a headache everyday for 3 weeks.

## 2019-01-04 ENCOUNTER — Encounter: Payer: Self-pay | Admitting: Family Medicine

## 2019-01-08 MED FILL — DULoxetine HCL 60 MG CPEP: 60 | 30 days supply | Qty: 30 | Fill #1

## 2019-01-25 ENCOUNTER — Ambulatory Visit (INDEPENDENT_AMBULATORY_CARE_PROVIDER_SITE_OTHER): Payer: Medicaid Other | Admitting: Specialist

## 2019-01-25 ENCOUNTER — Other Ambulatory Visit: Payer: Self-pay

## 2019-01-25 ENCOUNTER — Ambulatory Visit: Payer: Self-pay

## 2019-01-25 DIAGNOSIS — M5416 Radiculopathy, lumbar region: Secondary | ICD-10-CM

## 2019-01-29 MED FILL — ATORVASTATIN CALCIUM 20 MG: 20 | 30 days supply | Qty: 30 | Fill #3

## 2019-01-29 MED FILL — NAPROXEN 500 MG TABLET: 500 | 30 days supply | Qty: 60 | Fill #1

## 2019-01-29 MED FILL — CYCLOBENZAPRINE 10 MG TAB: 10 | 30 days supply | Qty: 60 | Fill #2

## 2019-02-03 ENCOUNTER — Other Ambulatory Visit: Payer: Self-pay | Admitting: Family Medicine

## 2019-02-03 DIAGNOSIS — M5442 Lumbago with sciatica, left side: Secondary | ICD-10-CM

## 2019-02-03 DIAGNOSIS — G8929 Other chronic pain: Secondary | ICD-10-CM

## 2019-02-08 ENCOUNTER — Ambulatory Visit (INDEPENDENT_AMBULATORY_CARE_PROVIDER_SITE_OTHER): Payer: Medicaid Other

## 2019-02-08 ENCOUNTER — Other Ambulatory Visit: Payer: Self-pay

## 2019-02-08 ENCOUNTER — Encounter: Payer: Self-pay | Admitting: Specialist

## 2019-02-08 ENCOUNTER — Ambulatory Visit (INDEPENDENT_AMBULATORY_CARE_PROVIDER_SITE_OTHER): Payer: Medicaid Other | Admitting: Specialist

## 2019-02-08 VITALS — BP 103/69 | HR 73 | Ht 67.0 in | Wt 140.0 lb

## 2019-02-08 DIAGNOSIS — M5136 Other intervertebral disc degeneration, lumbar region: Secondary | ICD-10-CM

## 2019-02-08 DIAGNOSIS — M5416 Radiculopathy, lumbar region: Secondary | ICD-10-CM

## 2019-02-08 DIAGNOSIS — M7062 Trochanteric bursitis, left hip: Secondary | ICD-10-CM | POA: Diagnosis not present

## 2019-02-08 DIAGNOSIS — M7061 Trochanteric bursitis, right hip: Secondary | ICD-10-CM | POA: Diagnosis not present

## 2019-02-08 DIAGNOSIS — M48062 Spinal stenosis, lumbar region with neurogenic claudication: Secondary | ICD-10-CM

## 2019-02-08 DIAGNOSIS — M4722 Other spondylosis with radiculopathy, cervical region: Secondary | ICD-10-CM | POA: Diagnosis not present

## 2019-02-08 MED ORDER — BUPIVACAINE HCL 0.25 % IJ SOLN
4.0000 mL | INTRAMUSCULAR | Status: AC | PRN
  Administered 2019-02-08: 4 mL via INTRA_ARTICULAR

## 2019-02-08 MED ORDER — METHYLPREDNISOLONE ACETATE 40 MG/ML IJ SUSP
40.0000 mg | INTRAMUSCULAR | Status: AC | PRN
  Administered 2019-02-08: 11:00:00 40 mg via INTRA_ARTICULAR

## 2019-02-08 MED ORDER — METHYLPREDNISOLONE ACETATE 40 MG/ML IJ SUSP
40.0000 mg | INTRAMUSCULAR | Status: AC | PRN
  Administered 2019-02-08: 40 mg via INTRA_ARTICULAR

## 2019-02-08 NOTE — Progress Notes (Signed)
Office Visit Note   Patient: Cheryl King           Date of Birth: Jun 06, 1965           MRN: [id number] Visit Date: 02/08/2019              Requested by: Charlott Rakes, MD [place],  [place] [address] PCP: Charlott Rakes, MD   Assessment & Plan: Visit Diagnoses:  1. Radiculopathy, lumbar region   2. Greater trochanteric bursitis, left   3. Greater trochanteric bursitis, right   4. Degenerative disc disease, lumbar   5. Spinal stenosis of lumbar region with neurogenic claudication   6. Other spondylosis with radiculopathy, cervical region     Plan: Avoid frequent bending and stooping  No lifting greater than 10 lbs. May use ice or moist heat for pain. Weight loss is of benefit. Best medication for lumbar disc disease is arthritis medications like motrin, celebrex and naprosyn. Physical therapy for trochanteric bursitis and lumbar diffuse degenerative disc disease. Exercise is important to improve your indurance and does allow people to function better inspite of back pain.    Follow-Up Instructions: No follow-ups on file.   Orders:  Orders Placed This Encounter  Procedures  . Large Joint Inj: bilateral greater trochanter  . XR Lumbar Spine 2-3 Views   No orders of the defined types were placed in this encounter.     Procedures: Large Joint Inj: bilateral greater trochanter on 02/08/2019 11:19 AM Indications: pain Details: 22 G 3.5 in needle, superolateral approach  Arthrogram: No  Medications (Right): 4 mL bupivacaine 0.25 %; 40 mg methylPREDNISolone acetate 40 MG/ML Medications (Left): 4 mL bupivacaine 0.25 %; 40 mg methylPREDNISolone acetate 40 MG/ML Outcome: tolerated well, no immediate complications Procedure, treatment alternatives, risks and benefits explained, specific risks discussed. Consent was given by the patient. Immediately prior to procedure a time out was called to verify the correct patient, procedure, equipment, support  staff and site/side marked as required. Patient was prepped and draped in the usual sterile fashion.       Clinical Data: No additional findings.   Subjective: Chief Complaint  Patient presents with  . Lower Back - Pain    54 year old female with bilateral hip bursitis, she has not had injections in the past for the bursitis because of a history of  HAs associated with steroid injection into the spine done in 7/ 2019. She has had pain with lying down and is unable to lie on  Either side and can not lie on her back or stomach so she sleeps mainly on her sided, awakening at 3-5 AM with pain. She has not had injection. She does not have diabetes and does want to try other ways to decrease the pain. No bowel or bladder concerns. She is able to stand and walk. She has family here in [place].    Review of Systems   Objective: Vital Signs: BP 103/69 (BP Location: Left Arm, Patient Position: Sitting)   Pulse 73   Ht 5\' 7"  (1.702 m)   Wt 140 lb (63.5 kg)   BMI 21.93 kg/m   Physical Exam  Ortho Exam  Specialty Comments:  No specialty comments available.  Imaging: XR Lumbar Spine 2-3 Views  Result Date: 02/08/2019 AP and lateral flexion and extension radiographs of the lumbar spine show DDD L1-2, L2-3 and L3-4 with DDD L5-S1 with vacuum sign. End plates sclerosis [id number] retrolisthesis L2-3, L3-4 and L5-S1. No acute changes, no  anterolisthesis.     PMFS History: Patient Active Problem List   Diagnosis Date Noted  . Hyperlipidemia 07/18/2017  . Ulnar neuropathy 04/18/2017  . GERD (gastroesophageal reflux disease) 10/19/2016  . Prediabetes 05/03/2016  . Cubital tunnel syndrome on right 03/01/2016  . Numbness and tingling of right arm 01/01/2016  . Chlamydia infection 01/27/2015  . Smoking 12/26/2013  . Chronic lower back pain 04/13/2013  . Sciatica 04/13/2013  . Chronic neck pain 04/13/2013  . Spondylosis of cervical spine 04/13/2013   Past Medical History:  Diagnosis Date    . Acid reflux   . Allergy   . Carpal tunnel syndrome   . Depression   . Diverticulitis   . Hyperlipidemia   . Low back pain   . Neck pain   . Sacroiliac inflammation ([place])   . Sciatica   . Seasonal allergies     Family History  Problem Relation Age of Onset  . Healthy Mother   . Other Father        Unsure of medical history  . Asthma Maternal Aunt   . Diabetes Maternal Aunt   . Cancer Maternal Aunt   . Breast cancer Maternal Aunt   . Hypertension Maternal Grandmother   . Colon cancer Neg Hx   . Pancreatic cancer Neg Hx   . Rectal cancer Neg Hx   . Stomach cancer Neg Hx   . Colon polyps Neg Hx   . Esophageal cancer Neg Hx     Past Surgical History:  Procedure Laterality Date  . ABDOMINAL SURGERY    . ANKLE SURGERY     lt.  Marland Kitchen PARTIAL HYSTERECTOMY    . right breast cyst removed     . URETHRAL DIVERTICULUM REPAIR     uretrral diverticultis   Social History   Occupational History  . Occupation: Unemployed  Tobacco Use  . Smoking status: Current Every Day Smoker    Packs/day: 0.50    Years: 35.00    Pack years: 17.50    Types: Cigarettes  . Smokeless tobacco: Never Used  Substance and Sexual Activity  . Alcohol use: Yes    Comment: Social only - seldom  . Drug use: No  . Sexual activity: Never

## 2019-02-08 NOTE — Patient Instructions (Signed)
Plan: Avoid frequent bending and stooping  No lifting greater than 10 lbs. May use ice or moist heat for pain. Weight loss is of benefit. Best medication for lumbar disc disease is arthritis medications like motrin, celebrex and naprosyn. Physical therapy for trochanteric bursitis and lumbar diffuse degenerative disc disease. Exercise is important to improve your indurance and does allow people to function better inspite of back pain.

## 2019-02-09 ENCOUNTER — Ambulatory Visit: Payer: Medicaid Other | Admitting: Specialist

## 2019-02-12 MED FILL — FLUTICASONE PROP 50 MCG SPR: 50 | 30 days supply | Qty: 16 | Fill #1

## 2019-02-12 MED FILL — DULoxetine HCL 60 MG CPEP: 60 | 30 days supply | Qty: 30 | Fill #2

## 2019-02-26 ENCOUNTER — Other Ambulatory Visit: Payer: Self-pay

## 2019-02-26 ENCOUNTER — Encounter: Payer: Self-pay | Admitting: Physical Therapy

## 2019-02-26 ENCOUNTER — Ambulatory Visit: Payer: Medicaid Other | Attending: Specialist | Admitting: Physical Therapy

## 2019-02-26 DIAGNOSIS — G8929 Other chronic pain: Secondary | ICD-10-CM | POA: Diagnosis not present

## 2019-02-26 DIAGNOSIS — M6281 Muscle weakness (generalized): Secondary | ICD-10-CM

## 2019-02-26 DIAGNOSIS — R252 Cramp and spasm: Secondary | ICD-10-CM | POA: Diagnosis not present

## 2019-02-26 DIAGNOSIS — M5441 Lumbago with sciatica, right side: Secondary | ICD-10-CM | POA: Diagnosis not present

## 2019-02-26 DIAGNOSIS — M5442 Lumbago with sciatica, left side: Secondary | ICD-10-CM | POA: Diagnosis not present

## 2019-02-26 DIAGNOSIS — M25552 Pain in left hip: Secondary | ICD-10-CM

## 2019-02-26 DIAGNOSIS — R279 Unspecified lack of coordination: Secondary | ICD-10-CM | POA: Insufficient documentation

## 2019-02-26 DIAGNOSIS — M25551 Pain in right hip: Secondary | ICD-10-CM

## 2019-02-26 NOTE — Therapy (Signed)
[place], [place], [address] Phone: [phone]   Fax:  [phone]  Physical Therapy Evaluation  Patient Details  Name: Cheryl King MRN: [id number] Date of Birth: 04/04/1965 Referring Provider (PT): Dr.  Louanne Skye    Encounter Date: 02/26/2019  PT End of Session - 02/26/19 1143    Visit Number  1    Number of Visits  16    Date for PT Re-Evaluation  04/23/19    Authorization Type  MCD    Authorization - Visit Number  0    Authorization - Number of Visits  16    PT Start Time  1003    PT Stop Time  1045    PT Time Calculation (min)  42 min    Activity Tolerance  Patient tolerated treatment well    Behavior During Therapy  [facility] for tasks assessed/performed       Past Medical History:  Diagnosis Date  . Acid reflux   . Allergy   . Carpal tunnel syndrome   . Depression   . Diverticulitis   . Hyperlipidemia   . Low back pain   . Neck pain   . Sacroiliac inflammation ([place])   . Sciatica   . Seasonal allergies     Past Surgical History:  Procedure Laterality Date  . ABDOMINAL SURGERY    . ANKLE SURGERY     lt.  Marland Kitchen PARTIAL HYSTERECTOMY    . right breast cyst removed     . URETHRAL DIVERTICULUM REPAIR     uretrral diverticultis    There were no vitals filed for this visit.   Subjective Assessment - 02/26/19 1007    Subjective  Patient with chronic low back pain has been here for PT prior for back and neck pain, pelvic floor issues and she did have some relief. She has had bilateral hip pain along the way.  She recently had injections in both hips and she has had relief of symptoms. She has trouble sleeping as she cannot sleep on her side, standing any length of time (clean, walk, exercise, yardwork).  She has stopped doing her exercises because she lacks motivation. She does want to avoid surgery. She says she occasionally feels her Legs are weak.  Pain in buttocks, lateral hips.  Occasionally  feet go numb,.    Pertinent History  pelvic floor, chronic pain, GERD, L ankle surgery    Limitations  Sitting;Walking;House hold activities;Standing    How long can you sit comfortably?  not long    How long can you stand comfortably?  10 min    How long can you walk comfortably?  not long    Diagnostic tests  XR neg.,  fell in Dec. 2020.    Patient Stated Goals  Patient would like to be able to eliminate pain.    Currently in Pain?  Yes    Pain Score  6     Pain Location  Back    Pain Orientation  Lower;Right;Left    Pain Descriptors / Indicators  Discomfort;Sharp    Pain Type  Chronic pain    Pain Radiating Towards  buttocks    Pain Onset  More than a month ago    Pain Frequency  Constant    Aggravating Factors   standing    Pain Relieving Factors  stretches, changing positions, leaning forward    Effect of Pain on Daily Activities  not ever comfortable doing normal ADLs  Multiple Pain Sites  No         [facility] PT Assessment - 02/26/19 0001      Assessment   Medical Diagnosis  DDD, bilateral greater trochanteric bursitis, spinal stenosis     Referring Provider (PT)  Dr.  Louanne Skye     Onset Date/Surgical Date  --   chronic    Next MD Visit  2/18    Prior Therapy  Yes      Precautions   Precautions  None      Restrictions   Weight Bearing Restrictions  No      Balance Screen   Has the patient fallen in the past 6 months  Yes    How many times?  1    Has the patient had a decrease in activity level because of a fear of falling?   Yes    Is the patient reluctant to leave their home because of a fear of falling?   No      Home Environment   Living Environment  Private residence    Living Arrangements  Alone    Type of [place] to enter    Entrance Stairs-Number of Steps  [place] - single point      Prior Function   Level of Independence  Independent with basic ADLs;Independent with  household mobility without device;Independent with community mobility without device    Vocation  Unemployed    Leisure  watch TV, dog, music       Cognition   Overall Cognitive Status  Within Functional Limits for tasks assessed      Observation/Other Assessments   Focus on Therapeutic Outcomes (FOTO)   NT MCD      Sensation   Light Touch  Appears Intact    Additional Comments  gross numbness from time to time on bottoms of feet       Squat   Comments  min cues, pain stable       Single Leg Stance   Comments  unable >5 sec each leg , R LE better than Lt (ankle?)       Posture/Postural Control   Posture/Postural Control  Postural limitations    Postural Limitations  Rounded Shoulders;Forward head;Anterior pelvic tilt      AROM   Lumbar Flexion  25% pain    Lumbar Extension  50% pain     Lumbar - Right Side Bend  stiff, cramp on R    Lumbar - Left Side Bend  stiff    Lumbar - Right Rotation  WFL but pain     Lumbar - Left Rotation  WFL but pain       PROM   Overall PROM Comments  hips [facility]      Strength   Overall Strength Comments  4/5 in hip flexion, Knees and ankle 5/5 DF     Right Hip ABduction  3+/5    Left Hip ABduction  3+/5      Palpation   Palpation comment  gross pain with palpation across thoracolumbar spinee, gluteals and laterally along thigh, ITB, Gr. Troch .       Special Tests    Special Tests  Lumbar    Lumbar Tests  FABER test;Straight Leg Raise      FABER test   findings  Negative      Straight Leg Raise  Findings  Negative      Ambulation/Gait   Gait Comments  no deviations on eval         Objective measurements completed on examination: See above findings.     PT Education - 02/26/19 1142    Education Details  PT/POC, HEP, pain control strategies, core    Person(s) Educated  Patient    Methods  Explanation;Demonstration    Comprehension  Verbalized understanding;Returned demonstration;Need further instruction       PT Short  Term Goals - 02/26/19 1144      PT SHORT TERM GOAL #1   Title  Pt will be able to demonstrate proper core contraction in supine, seated and standing postures    Baseline  began educating at eval    Time  3    Period  Weeks    Status  New    Target Date  03/19/19      PT SHORT TERM GOAL #2   Title  Pt will be independent in short term HEP for core, hips    Baseline  given on eval    Time  3    Period  Weeks    Status  New    Target Date  03/19/19        PT Long Term Goals - 02/26/19 1145      PT LONG TERM GOAL #1   Title  Pt will be able to show independence with HEP and compliance for long term success    Baseline  unknown to patient    Time  8    Period  Weeks    Status  New    Target Date  04/23/19      PT LONG TERM GOAL #2   Title  Pt will be able to report standing, walking for 30 min with min increase in pain from baseline (errands, fitness)    Baseline  maybe 10 min, not comfortable    Time  8    Period  Weeks    Status  New    Target Date  04/23/19      PT LONG TERM GOAL #3   Title  Pt will be able to sit for meals 30 min without increasing back pain    Baseline  10-15 min    Time  8    Period  Weeks    Status  New    Target Date  04/23/19      PT LONG TERM GOAL #4   Title  Pt will be able to demo 5/5 hip strength bilateral to support spine, core with standing, walking.    Baseline  3+/5    Time  8    Period  Weeks    Status  New    Target Date  04/23/19      PT LONG TERM GOAL #5   Title  Pt will be able to lift 25 lbs with good form and no increased pain    Baseline  does not do    Time  8    Period  Weeks    Status  New    Target Date  04/23/19             Plan - 02/26/19 1152    Clinical Impression Statement  Patient presents for PT for chronic low back pain and bilateral hip bursitis. She has had full resolution of hip pain with injections. Overall, she has good flexibility, mild deficits in core and hip strength.  She lacks motivation  to her exercises despite the fact that they do make her feel better.  She did improve with PT but reports worsening symptoms in the past 6 mos.  She needs to get back on track with her exercises and instill good habits around her daily activities.    Personal Factors and Comorbidities  Behavior Pattern;Comorbidity 1;Time since onset of injury/illness/exacerbation    Comorbidities  chronic pain, L ankle surgery, neck pain, pelvic floor weakness    Examination-Activity Limitations  Stand;Sleep;Squat;Locomotion Level;Carry;Bend    Examination-Participation Restrictions  Interpersonal Relationship;Yard Work;Community Activity;Shop;Meal Prep;Cleaning;Laundry    Stability/Clinical Decision Making  Evolving/Moderate complexity    Clinical Decision Making  Moderate    Rehab Potential  Good    PT Frequency  2x / week    PT Duration  8 weeks   2 x 2 weeks then 2 x 6 if progressing   PT Treatment/Interventions  ADLs/Self Care Home Management;Cryotherapy;Electrical Stimulation;Moist Heat;Traction;Ultrasound;DME Instruction;Iontophoresis 4mg /ml Dexamethasone;Functional mobility training;Balance training;Therapeutic activities;Therapeutic exercise;Patient/family education;Manual techniques;Passive range of motion;Dry needling;Taping;Neuromuscular re-education    PT Next Visit Plan  check HEP, add in lower abs, squats, core stability    PT Home Exercise Plan  seated piriformis, hip abd, childs pose    Consulted and Agree with Plan of Care  Patient       Patient will benefit from skilled therapeutic intervention in order to improve the following deficits and impairments:  Decreased activity tolerance, Decreased range of motion, Decreased strength, Decreased mobility, Decreased balance, Difficulty walking, Impaired flexibility, Pain, Impaired UE functional use, Postural dysfunction, Increased fascial restricitons, Impaired sensation, Improper body mechanics  Visit Diagnosis: Chronic bilateral low back pain with  bilateral sciatica  Muscle weakness (generalized)  Pain in left hip  Pain in right hip     Problem List Patient Active Problem List   Diagnosis Date Noted  . Hyperlipidemia 07/18/2017  . Ulnar neuropathy 04/18/2017  . GERD (gastroesophageal reflux disease) 10/19/2016  . Prediabetes 05/03/2016  . Cubital tunnel syndrome on right 03/01/2016  . Numbness and tingling of right arm 01/01/2016  . Chlamydia infection 01/27/2015  . Smoking 12/26/2013  . Chronic lower back pain 04/13/2013  . Sciatica 04/13/2013  . Chronic neck pain 04/13/2013  . Spondylosis of cervical spine 04/13/2013    Cheryl King 02/26/2019, 12:00 PM  [place], [place], [address] Phone: [phone]   Fax:  [phone]  Name: Cheryl King MRN: [id number] Date of Birth: 30-Mar-1965   Raeford Razor, PT 02/26/19 12:00 PM Phone: [phone] Fax: [phone]

## 2019-02-26 NOTE — Patient Instructions (Signed)
Access Code: [email or web address]  URL: [email or web address]  Date: 02/26/2019  Prepared by: Raeford Razor   Exercises  Sidelying Hip Abduction - 10 reps - 2 sets - 5 hold - 2x daily - 7x weekly  Seated Piriformis Stretch - 3 reps - 1 sets - 30 hold - 2x daily - 7x weekly  Child's Pose Stretch - 3 reps - 1 sets - 30 hold - 2x daily - 7x weekly  Supine Double Knee to Chest - 3 reps - 1 sets - 30 hold - 2x daily - 7x weekly  Supine Lower Trunk Rotation - 10 reps - 2 sets - 15 hold - 2x daily - 7x weekly

## 2019-03-05 ENCOUNTER — Other Ambulatory Visit: Payer: Self-pay | Admitting: Family Medicine

## 2019-03-05 DIAGNOSIS — M5441 Lumbago with sciatica, right side: Secondary | ICD-10-CM

## 2019-03-05 DIAGNOSIS — G8929 Other chronic pain: Secondary | ICD-10-CM

## 2019-03-05 MED FILL — ATORVASTATIN CALCIUM 20 MG: 20 | 30 days supply | Qty: 30 | Fill #4

## 2019-03-05 MED FILL — NAPROXEN 500 MG TABLET: 500 | 30 days supply | Qty: 60 | Fill #2

## 2019-03-06 ENCOUNTER — Encounter: Payer: Self-pay | Admitting: Physical Therapy

## 2019-03-06 ENCOUNTER — Ambulatory Visit: Payer: Medicaid Other | Admitting: Physical Therapy

## 2019-03-06 ENCOUNTER — Other Ambulatory Visit: Payer: Self-pay

## 2019-03-06 DIAGNOSIS — M25552 Pain in left hip: Secondary | ICD-10-CM

## 2019-03-06 DIAGNOSIS — M25551 Pain in right hip: Secondary | ICD-10-CM

## 2019-03-06 DIAGNOSIS — G8929 Other chronic pain: Secondary | ICD-10-CM | POA: Diagnosis not present

## 2019-03-06 DIAGNOSIS — R252 Cramp and spasm: Secondary | ICD-10-CM | POA: Diagnosis not present

## 2019-03-06 DIAGNOSIS — R279 Unspecified lack of coordination: Secondary | ICD-10-CM

## 2019-03-06 DIAGNOSIS — M5441 Lumbago with sciatica, right side: Secondary | ICD-10-CM | POA: Diagnosis not present

## 2019-03-06 DIAGNOSIS — M6281 Muscle weakness (generalized): Secondary | ICD-10-CM | POA: Diagnosis not present

## 2019-03-06 DIAGNOSIS — M5442 Lumbago with sciatica, left side: Secondary | ICD-10-CM | POA: Diagnosis not present

## 2019-03-06 MED FILL — CYCLOBENZAPRINE 10 MG TAB: 10 | 30 days supply | Qty: 60 | Fill #0

## 2019-03-06 NOTE — Therapy (Signed)
[place], [place], [address] Phone: [phone]   Fax:  [phone]  Physical Therapy Treatment  Patient Details  Name: Cheryl King MRN: [id number] Date of Birth: 1965/01/27 Referring Provider (PT): Dr.  Louanne Skye    Encounter Date: 03/06/2019  PT End of Session - 03/06/19 0853    Visit Number  2    Number of Visits  16    Date for PT Re-Evaluation  04/23/19    Authorization Type  MCD    Authorization Time Period  03/06/19-03/19/19    Authorization - Visit Number  1    Authorization - Number of Visits  3    PT Start Time  0845    PT Stop Time  [id number]    PT Time Calculation (min)  39 min       Past Medical History:  Diagnosis Date  . Acid reflux   . Allergy   . Carpal tunnel syndrome   . Depression   . Diverticulitis   . Hyperlipidemia   . Low back pain   . Neck pain   . Sacroiliac inflammation ([place])   . Sciatica   . Seasonal allergies     Past Surgical History:  Procedure Laterality Date  . ABDOMINAL SURGERY    . ANKLE SURGERY     lt.  Marland Kitchen PARTIAL HYSTERECTOMY    . right breast cyst removed     . URETHRAL DIVERTICULUM REPAIR     uretrral diverticultis    There were no vitals filed for this visit.  Subjective Assessment - 03/06/19 0848    Subjective  Hips are not bothering me since the injection. Back is bothering me 8/10, the rain makes it worse.    Currently in Pain?  Yes    Pain Score  8     Pain Location  Back    Pain Orientation  Lower    Pain Descriptors / Indicators  Aching    Pain Type  Chronic pain    Aggravating Factors   weather, standing    Pain Relieving Factors  stretches, changing positions                       [facility] Adult PT Treatment/Exercise - 03/06/19 0001      Lumbar Exercises: Stretches   Piriformis Stretch  Right;Left;2 reps    Gastroc Stretch  2 reps;20 seconds    Gastroc Stretch Limitations  runners stretch     Other Lumbar Stretch  Exercise  soleus stretch 2 x 20 sec each       Lumbar Exercises: Standing   Other Standing Lumbar Exercises  squats at sink x 10- cues for technique and posture , squats at mat x 5       Lumbar Exercises: Supine   Clam  15 reps    Bent Knee Raise  15 reps    Bridge  20 reps      Lumbar Exercises: Sidelying   Clam  15 reps      Lumbar Exercises: Quadruped   [place]  10 reps    Other Quadruped Lumbar Exercises  childs pose forawrd and laterals x 2 each               PT Short Term Goals - 02/26/19 1144      PT SHORT TERM GOAL #1   Title  Pt will be able to demonstrate proper core contraction in supine, seated and standing postures  Baseline  began educating at eval    Time  3    Period  Weeks    Status  New    Target Date  03/19/19      PT SHORT TERM GOAL #2   Title  Pt will be independent in short term HEP for core, hips    Baseline  given on eval    Time  3    Period  Weeks    Status  New    Target Date  03/19/19        PT Long Term Goals - 02/26/19 1145      PT LONG TERM GOAL #1   Title  Pt will be able to show independence with HEP and compliance for long term success    Baseline  unknown to patient    Time  8    Period  Weeks    Status  New    Target Date  04/23/19      PT LONG TERM GOAL #2   Title  Pt will be able to report standing, walking for 30 min with min increase in pain from baseline (errands, fitness)    Baseline  maybe 10 min, not comfortable    Time  8    Period  Weeks    Status  New    Target Date  04/23/19      PT LONG TERM GOAL #3   Title  Pt will be able to sit for meals 30 min without increasing back pain    Baseline  10-15 min    Time  8    Period  Weeks    Status  New    Target Date  04/23/19      PT LONG TERM GOAL #4   Title  Pt will be able to demo 5/5 hip strength bilateral to support spine, core with standing, walking.    Baseline  3+/5    Time  8    Period  Weeks    Status  New    Target Date   04/23/19      PT LONG TERM GOAL #5   Title  Pt will be able to lift 25 lbs with good form and no increased pain    Baseline  does not do    Time  8    Period  Weeks    Status  New    Target Date  04/23/19            Plan - 03/06/19 0909    Clinical Impression Statement  Pt reports hips are better since injection. Her back is 8/10 today and aggravated by  the weather. She reports new onset of left plantar foot pain. Began squats and calf stretching, added core. She tolerated session well with reports of feeling her lower abdominals working.    PT Next Visit Plan  check HEP, add in lower abs, squats, core stability    PT Home Exercise Plan  seated piriformis, hip abd, childs pose, added bent knee raise, bridge, squats       Patient will benefit from skilled therapeutic intervention in order to improve the following deficits and impairments:  Decreased activity tolerance, Decreased range of motion, Decreased strength, Decreased mobility, Decreased balance, Difficulty walking, Impaired flexibility, Pain, Impaired UE functional use, Postural dysfunction, Increased fascial restricitons, Impaired sensation, Improper body mechanics  Visit Diagnosis: Chronic bilateral low back pain with bilateral sciatica  Muscle weakness (generalized)  Pain in left hip  Pain in right hip  Unspecified lack of coordination  Cramp and spasm     Problem List Patient Active Problem List   Diagnosis Date Noted  . Hyperlipidemia 07/18/2017  . Ulnar neuropathy 04/18/2017  . GERD (gastroesophageal reflux disease) 10/19/2016  . Prediabetes 05/03/2016  . Cubital tunnel syndrome on right 03/01/2016  . Numbness and tingling of right arm 01/01/2016  . Chlamydia infection 01/27/2015  . Smoking 12/26/2013  . Chronic lower back pain 04/13/2013  . Sciatica 04/13/2013  . Chronic neck pain 04/13/2013  . Spondylosis of cervical spine 04/13/2013    Dorene Ar, [place] 03/06/2019, 9:25 AM  [place], [place], [address] Phone: [phone]   Fax:  [phone]  Name: Cheryl King MRN: [id number] Date of Birth: 26-Dec-1965

## 2019-03-08 ENCOUNTER — Ambulatory Visit: Payer: Medicaid Other | Admitting: Specialist

## 2019-03-09 ENCOUNTER — Ambulatory Visit: Payer: Medicaid Other | Admitting: Physical Therapy

## 2019-03-13 ENCOUNTER — Encounter: Payer: Self-pay | Admitting: Physical Therapy

## 2019-03-13 ENCOUNTER — Other Ambulatory Visit: Payer: Self-pay

## 2019-03-13 ENCOUNTER — Ambulatory Visit: Payer: Medicaid Other | Admitting: Physical Therapy

## 2019-03-13 DIAGNOSIS — G8929 Other chronic pain: Secondary | ICD-10-CM

## 2019-03-13 DIAGNOSIS — R252 Cramp and spasm: Secondary | ICD-10-CM | POA: Diagnosis not present

## 2019-03-13 DIAGNOSIS — R279 Unspecified lack of coordination: Secondary | ICD-10-CM

## 2019-03-13 DIAGNOSIS — M25551 Pain in right hip: Secondary | ICD-10-CM

## 2019-03-13 DIAGNOSIS — M25552 Pain in left hip: Secondary | ICD-10-CM | POA: Diagnosis not present

## 2019-03-13 DIAGNOSIS — M6281 Muscle weakness (generalized): Secondary | ICD-10-CM | POA: Diagnosis not present

## 2019-03-13 DIAGNOSIS — M5441 Lumbago with sciatica, right side: Secondary | ICD-10-CM | POA: Diagnosis not present

## 2019-03-13 DIAGNOSIS — M5442 Lumbago with sciatica, left side: Secondary | ICD-10-CM | POA: Diagnosis not present

## 2019-03-13 NOTE — Therapy (Signed)
[place], [place], [address] Phone: [phone]   Fax:  [phone]  Physical Therapy Treatment  Patient Details  Name: Cheryl King MRN: [id number] Date of Birth: [birth year] Referring Provider (PT): Dr.  Louanne Skye    Encounter Date: 03/13/2019  PT End of Session - 03/13/19 0833    Visit Number  3    Number of Visits  16    Date for PT Re-Evaluation  04/23/19    Authorization Type  MCD    Authorization Time Period  03/06/19-03/19/19    Authorization - Visit Number  2    Authorization - Number of Visits  3    PT Start Time  0830    PT Stop Time  0928    PT Time Calculation (min)  58 min       Past Medical History:  Diagnosis Date  . Acid reflux   . Allergy   . Carpal tunnel syndrome   . Depression   . Diverticulitis   . Hyperlipidemia   . Low back pain   . Neck pain   . Sacroiliac inflammation ([place])   . Sciatica   . Seasonal allergies     Past Surgical History:  Procedure Laterality Date  . ABDOMINAL SURGERY    . ANKLE SURGERY     lt.  Marland Kitchen PARTIAL HYSTERECTOMY    . right breast cyst removed     . URETHRAL DIVERTICULUM REPAIR     uretrral diverticultis    There were no vitals filed for this visit.  Subjective Assessment - 03/13/19 0831    Subjective  I am a 10/10.  I washed clothes, dishes and cooked yesterday.  thats why.    Currently in Pain?  Yes    Pain Score  10-Worst pain ever    Pain Location  Back    Pain Orientation  Lower    Pain Descriptors / Indicators  Sore    Pain Type  Chronic pain    Pain Radiating Towards  buttocks    Pain Onset  More than a month ago    Pain Frequency  Constant    Aggravating Factors   activity, weather    Pain Relieving Factors  stretching, change positions         [facility] Adult PT Treatment/Exercise - 03/13/19 0001      Lumbar Exercises: Stretches   Active Hamstring Stretch  3 reps;30 seconds    Double Knee to Chest Stretch  3 reps    Lower Trunk Rotation  10 seconds    Lower Trunk Rotation Limitations  x 10     Piriformis Stretch  Right;Left;3 reps      Lumbar Exercises: Standing   Functional Squats  15 reps      Lumbar Exercises: Supine   Clam  15 reps    Bent Knee Raise  15 reps    Bridge with Ball Squeeze  15 reps    Straight Leg Raise  10 reps    Straight Leg Raises Limitations  ball under pelvis       Lumbar Exercises: Quadruped   Other Quadruped Lumbar Exercises  childs pose forawrd and laterals x 2 each      Knee/Hip Exercises: Standing   Hip Abduction  Stengthening;Both;1 set;10 reps    Lateral Step Up  Both;1 set;15 reps;Hand Hold: 1;Step Height: 8"    Functional Squat Limitations  5 lbs KB for form, cues for neck and hip hinge, no pain  in back at this point      Modalities   Modalities  Moist Heat      Moist Heat Therapy   Number Minutes Moist Heat  10 Minutes    Moist Heat Location  Lumbar Spine      Manual Therapy   Manual Therapy  Soft tissue mobilization;Myofascial release    Soft tissue mobilization  thoracic and lumbar paraspinals, QL, glutes- compression    Myofascial Release  LS spine , hip ER/IR with compression              PT Education - 03/13/19 0850    Education Details  HEP, core , squat form    Person(s) Educated  Patient    Methods  Explanation;Demonstration    Comprehension  Returned demonstration;Verbalized understanding       PT Short Term Goals - 03/13/19 0843      PT SHORT TERM GOAL #1   Title  Pt will be able to demonstrate proper core contraction in supine, seated and standing postures    Baseline  done in supine    Status  On-going      PT SHORT TERM GOAL #2   Title  Pt will be independent in short term HEP for core, hips    Baseline  needs help with core , new to patient    Status  On-going        PT Long Term Goals - 03/13/19 0844      PT LONG TERM GOAL #1   Title  Pt will be able to show independence with HEP and compliance for long term  success    Status  On-going      PT LONG TERM GOAL #2   Title  Pt will be able to report standing, walking for 30 min with min increase in pain from baseline (errands, fitness)    Status  On-going      PT LONG TERM GOAL #3   Title  Pt will be able to sit for meals 30 min without increasing back pain    Status  On-going      PT LONG TERM GOAL #4   Title  Pt will be able to demo 5/5 hip strength bilateral to support spine, core with standing, walking.    Status  On-going      PT LONG TERM GOAL #5   Title  Pt will be able to lift 25 lbs with good form and no increased pain    Status  On-going            Plan - 03/13/19 0844    Clinical Impression Statement  Hips continue to be less painful .  Back pain was increased today and attributes it to doing housework yesterday. She can demo stability exercises with min cues but has not been performing regularly. Very sore in glutes and painful with L4-L5-S1 manual, thoracic hypertonic.    Examination-Participation Restrictions  Interpersonal Relationship;Yard Work;Community Activity;Shop;Meal Prep;Cleaning;Laundry    PT Frequency  2x / week    PT Duration  8 weeks    PT Treatment/Interventions  ADLs/Self Care Home Management;Cryotherapy;Electrical Stimulation;Moist Heat;Traction;Ultrasound;DME Instruction;Iontophoresis 4mg /ml Dexamethasone;Functional mobility training;Balance training;Therapeutic activities;Therapeutic exercise;Patient/family education;Manual techniques;Passive range of motion;Dry needling;Taping;Neuromuscular re-education    PT Next Visit Plan  core in standing, hips strength, walking program?    PT Home Exercise Plan  seated piriformis, hip abd, childs pose, added bent knee raise, bridge, squats    Consulted and Agree with Plan of Care  Patient       Patient will benefit from skilled therapeutic intervention in order to improve the following deficits and impairments:  Decreased activity tolerance, Decreased range of  motion, Decreased strength, Decreased mobility, Decreased balance, Difficulty walking, Impaired flexibility, Pain, Impaired UE functional use, Postural dysfunction, Increased fascial restricitons, Impaired sensation, Improper body mechanics  Visit Diagnosis: Chronic bilateral low back pain with bilateral sciatica  Muscle weakness (generalized)  Pain in left hip  Pain in right hip  Unspecified lack of coordination  Cramp and spasm     Problem List Patient Active Problem List   Diagnosis Date Noted  . Hyperlipidemia 07/18/2017  . Ulnar neuropathy 04/18/2017  . GERD (gastroesophageal reflux disease) 10/19/2016  . Prediabetes 05/03/2016  . Cubital tunnel syndrome on right 03/01/2016  . Numbness and tingling of right arm 01/01/2016  . Chlamydia infection 01/27/2015  . Smoking 12/26/2013  . Chronic lower back pain 04/13/2013  . Sciatica 04/13/2013  . Chronic neck pain 04/13/2013  . Spondylosis of cervical spine 04/13/2013    Cheryl King 03/13/2019, 9:16 AM  [place], [place], [address] Phone: [phone]   Fax:  [phone]  Name: Cheryl King MRN: [id number] Date of Birth: 05-15-1965  Raeford Razor, PT 03/13/19 9:16 AM Phone: [phone] Fax: [phone]

## 2019-03-14 ENCOUNTER — Ambulatory Visit (INDEPENDENT_AMBULATORY_CARE_PROVIDER_SITE_OTHER): Payer: Medicaid Other | Admitting: Specialist

## 2019-03-14 ENCOUNTER — Encounter: Payer: Self-pay | Admitting: Specialist

## 2019-03-14 VITALS — BP 103/70 | HR 72 | Ht 67.0 in | Wt 140.0 lb

## 2019-03-14 DIAGNOSIS — M7062 Trochanteric bursitis, left hip: Secondary | ICD-10-CM

## 2019-03-14 DIAGNOSIS — M48062 Spinal stenosis, lumbar region with neurogenic claudication: Secondary | ICD-10-CM | POA: Diagnosis not present

## 2019-03-14 DIAGNOSIS — M4722 Other spondylosis with radiculopathy, cervical region: Secondary | ICD-10-CM

## 2019-03-14 DIAGNOSIS — M7061 Trochanteric bursitis, right hip: Secondary | ICD-10-CM

## 2019-03-14 MED FILL — DULoxetine HCL 60 MG CPEP: 60 | 30 days supply | Qty: 30 | Fill #3

## 2019-03-14 MED FILL — FLUTICASONE PROP 50 MCG SPR: 50 | 30 days supply | Qty: 16 | Fill #2

## 2019-03-14 NOTE — Progress Notes (Signed)
Office Visit Note   Patient: Cheryl King           Date of Birth: 23-Oct-1965           MRN: [id number] Visit Date: 03/14/2019              Requested by: Charlott Rakes, MD [place],  [place] [address] PCP: Charlott Rakes, MD   Assessment & Plan: Visit Diagnoses:  1. Trochanteric bursitis, right hip   2. Spinal stenosis of lumbar region with neurogenic claudication   3. Other spondylosis with radiculopathy, cervical region   4. Greater trochanteric bursitis, left   5. Greater trochanteric bursitis, right     Plan: Avoid bending, stooping and avoid lifting weights greater than 10 lbs. Avoid prolong standing and walking. Avoid frequent bending and stooping  No lifting greater than 10 lbs. May use ice or moist heat for pain. Weight loss is of benefit. Handicap license is approved. Perform ITB stretching exercises and core strengthening continue with PT for another 2weeks. Overall you are doing well.   Follow-Up Instructions: Return in about 4 weeks (around 04/11/2019).   Orders:  No orders of the defined types were placed in this encounter.  No orders of the defined types were placed in this encounter.     Procedures: No procedures performed   Clinical Data: No additional findings.   Subjective: Chief Complaint  Patient presents with  . Lower Back - Follow-up    HPI  Review of Systems  Constitutional: Negative.   HENT: Negative.   Eyes: Negative.   Respiratory: Negative.   Cardiovascular: Negative.   Gastrointestinal: Negative.   Endocrine: Negative.   Genitourinary: Negative.   Musculoskeletal: Negative.   Skin: Negative.   Allergic/Immunologic: Negative.   Neurological: Negative.   Hematological: Negative.   Psychiatric/Behavioral: Negative.      Objective: Vital Signs: BP 103/70 (BP Location: Left Arm, Patient Position: Sitting)   Pulse 72   Ht 5\' 7"  (1.702 m)   Wt 140 lb (63.5 kg)   BMI 21.93 kg/m   Physical  Exam Constitutional:      Appearance: She is well-developed.  HENT:     Head: Normocephalic and atraumatic.  Eyes:     Pupils: Pupils are equal, round, and reactive to light.  Pulmonary:     Effort: Pulmonary effort is normal.     Breath sounds: Normal breath sounds.  Abdominal:     General: Bowel sounds are normal.     Palpations: Abdomen is soft.  Musculoskeletal:        General: Normal range of motion.     Cervical back: Normal range of motion and neck supple.  Skin:    General: Skin is warm and dry.  Neurological:     Mental Status: She is alert and oriented to person, place, and time.  Psychiatric:        Behavior: Behavior normal.        Thought Content: Thought content normal.        Judgment: Judgment normal.     Ortho Exam  Specialty Comments:  No specialty comments available.  Imaging: No results found.   PMFS History: Patient Active Problem List   Diagnosis Date Noted  . Hyperlipidemia 07/18/2017  . Ulnar neuropathy 04/18/2017  . GERD (gastroesophageal reflux disease) 10/19/2016  . Prediabetes 05/03/2016  . Cubital tunnel syndrome on right 03/01/2016  . Numbness and tingling of right arm 01/01/2016  . Chlamydia infection 01/27/2015  . Smoking  12/26/2013  . Chronic lower back pain 04/13/2013  . Sciatica 04/13/2013  . Chronic neck pain 04/13/2013  . Spondylosis of cervical spine 04/13/2013   Past Medical History:  Diagnosis Date  . Acid reflux   . Allergy   . Carpal tunnel syndrome   . Depression   . Diverticulitis   . Hyperlipidemia   . Low back pain   . Neck pain   . 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 Sciatica   . Seasonal allergies     Family History  Problem Relation Age of Onset  . Healthy Mother   . Other Father        Unsure of medical history  . Asthma Maternal Aunt   . Diabetes Maternal Aunt   . Cancer Maternal Aunt   . Breast cancer Maternal Aunt   . Hypertension Maternal Grandmother   . Colon cancer Neg Hx   . Pancreatic  cancer Neg Hx   . Rectal cancer Neg Hx   . Stomach cancer Neg Hx   . Colon polyps Neg Hx   . Esophageal cancer Neg Hx     Past Surgical History:  Procedure Laterality Date  . ABDOMINAL SURGERY    . ANKLE SURGERY     lt.  Marland Kitchen PARTIAL HYSTERECTOMY    . right breast cyst removed     . URETHRAL DIVERTICULUM REPAIR     uretrral diverticultis   Social History   Occupational History  . Occupation: Unemployed  Tobacco Use  . Smoking status: Current Every Day Smoker    Packs/day: 0.50    Years: 35.00    Pack years: 17.50    Types: Cigarettes  . Smokeless tobacco: Never Used  Substance and Sexual Activity  . Alcohol use: Yes    Comment: Social only - seldom  . Drug use: No  . Sexual activity: Never

## 2019-03-16 ENCOUNTER — Encounter: Payer: Self-pay | Admitting: Physical Therapy

## 2019-03-16 ENCOUNTER — Ambulatory Visit: Payer: Medicaid Other | Admitting: Physical Therapy

## 2019-03-16 ENCOUNTER — Other Ambulatory Visit: Payer: Self-pay

## 2019-03-16 DIAGNOSIS — M5442 Lumbago with sciatica, left side: Secondary | ICD-10-CM

## 2019-03-16 DIAGNOSIS — R279 Unspecified lack of coordination: Secondary | ICD-10-CM

## 2019-03-16 DIAGNOSIS — M6281 Muscle weakness (generalized): Secondary | ICD-10-CM | POA: Diagnosis not present

## 2019-03-16 DIAGNOSIS — R252 Cramp and spasm: Secondary | ICD-10-CM

## 2019-03-16 DIAGNOSIS — M5441 Lumbago with sciatica, right side: Secondary | ICD-10-CM | POA: Diagnosis not present

## 2019-03-16 DIAGNOSIS — M25551 Pain in right hip: Secondary | ICD-10-CM

## 2019-03-16 DIAGNOSIS — F331 Major depressive disorder, recurrent, moderate: Secondary | ICD-10-CM | POA: Diagnosis not present

## 2019-03-16 DIAGNOSIS — G8929 Other chronic pain: Secondary | ICD-10-CM

## 2019-03-16 DIAGNOSIS — F0631 Mood disorder due to known physiological condition with depressive features: Secondary | ICD-10-CM | POA: Diagnosis not present

## 2019-03-16 DIAGNOSIS — F4310 Post-traumatic stress disorder, unspecified: Secondary | ICD-10-CM | POA: Diagnosis not present

## 2019-03-16 DIAGNOSIS — M25552 Pain in left hip: Secondary | ICD-10-CM

## 2019-03-16 NOTE — Therapy (Signed)
Cheryl King, [place], [address] Phone: [phone]   Fax:  [phone]  Physical Therapy Treatment  Patient Details  Name: Cheryl King MRN: [id number] Date of Birth: 1965/10/10 Referring Provider (PT): Dr.  Louanne Skye    Encounter Date: 03/16/2019  PT End of Session - 03/16/19 0858    Visit Number  4    Number of Visits  16    Date for PT Re-Evaluation  04/23/19    Authorization Type  MCD    Authorization Time Period  03/06/19-03/19/19    Authorization - Visit Number  3    Authorization - Number of Visits  3    PT Start Time  0832    PT Stop Time  0910    PT Time Calculation (min)  38 min    Activity Tolerance  Patient tolerated treatment well    Behavior During Therapy  [facility] for tasks assessed/performed       Past Medical History:  Diagnosis Date  . Acid reflux   . Allergy   . Carpal tunnel syndrome   . Depression   . Diverticulitis   . Hyperlipidemia   . Low back pain   . Neck pain   . 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 Sciatica   . Seasonal allergies     Past Surgical History:  Procedure Laterality Date  . ABDOMINAL SURGERY    . ANKLE SURGERY     lt.  Marland Kitchen PARTIAL HYSTERECTOMY    . right breast cyst removed     . URETHRAL DIVERTICULUM REPAIR     uretrral diverticultis    There were no vitals filed for this visit.  Subjective Assessment - 03/16/19 0834    Subjective  Pain in buttocks today.  5/10.    Currently in Pain?  Yes        [facility] PT Treatment/Exercise - 03/16/19 0001      Self-Care   Self-Care  Lifting;Posture    Lifting  body mechanics with tying shoes    Posture  standing core       Lumbar Exercises: Standing   Row  Strengthening;15 reps;Theraband    Theraband Level (Row)  Level 4 (Blue)    Shoulder Extension  Strengthening;Both;15 reps;Theraband    Theraband Level (Shoulder Extension)  Level 4 (Blue)    Other Standing Lumbar Exercises  Palloff press x 15 blue  band , then rotate x 10      Lumbar Exercises: Quadruped   Opposite Arm/Leg Raise  Right arm/Left leg;Left arm/Right leg;10 reps    Opposite Arm/Leg Raise Limitations  good stability    Other Quadruped Lumbar Exercises  childs pose forawrd and laterals x 2 each      Knee/Hip Exercises: Stretches   Piriformis Stretch  3 reps;30 seconds      Knee/Hip Exercises: Aerobic   Nustep  6 min L5 UE and LE       Knee/Hip Exercises: Standing   Other Standing Knee Exercises  TRX squats x 15, single leg hold with UE motion to challenge core       Knee/Hip Exercises: Sidelying   Hip ABduction  Strengthening;Both;2 sets;10 reps    Hip ABduction Limitations  cues for hip ext      Moist Heat Therapy   Number Minutes Moist Heat  --    Moist Heat Location  --             PT Education - 03/16/19 [id number]  Education Details  core    Person(s) Educated  Patient    Methods  Explanation;Demonstration    Comprehension  Verbalized understanding;Returned demonstration       PT Short Term Goals - 03/13/19 0843      PT SHORT TERM GOAL #1   Title  Pt will be able to demonstrate proper core contraction in supine, seated and standing postures    Baseline  done in supine    Status  On-going      PT SHORT TERM GOAL #2   Title  Pt will be independent in short term HEP for core, hips    Baseline  needs help with core , new to patient    Status  On-going        PT Long Term Goals - 03/13/19 0844      PT LONG TERM GOAL #1   Title  Pt will be able to show independence with HEP and compliance for long term success    Status  On-going      PT LONG TERM GOAL #2   Title  Pt will be able to report standing, walking for 30 min with min increase in pain from baseline (errands, fitness)    Status  On-going      PT LONG TERM GOAL #3   Title  Pt will be able to sit for meals 30 min without increasing back pain    Status  On-going      PT LONG TERM GOAL #4   Title  Pt will be able to demo 5/5 hip  strength bilateral to support spine, core with standing, walking.    Status  On-going      PT LONG TERM GOAL #5   Title  Pt will be able to lift 25 lbs with good form and no increased pain    Status  On-going            Plan - 03/16/19 0835    Clinical Impression Statement  Patient has completed 1st 3 visits of PT.  She reports therapy is helping her but cannot be specific about how.  ERO Next week will determine specifics. Needed min cues for proper technique of HEP.    PT Treatment/Interventions  ADLs/Self Care Home Management;Cryotherapy;Electrical Stimulation;Moist Heat;Traction;Ultrasound;DME Instruction;Iontophoresis 4mg /ml Dexamethasone;Functional mobility training;Balance training;Therapeutic activities;Therapeutic exercise;Patient/family education;Manual techniques;Passive range of motion;Dry needling;Taping;Neuromuscular re-education    PT Next Visit Plan  core in standing, hips strength, walking program?    PT Home Exercise Plan  seated piriformis, hip abd, childs pose, added bent knee raise, bridge, squats    Consulted and Agree with Plan of Care  Patient       Patient will benefit from skilled therapeutic intervention in order to improve the following deficits and impairments:  Decreased activity tolerance, Decreased range of motion, Decreased strength, Decreased mobility, Decreased balance, Difficulty walking, Impaired flexibility, Pain, Impaired UE functional use, Postural dysfunction, Increased fascial restricitons, Impaired sensation, Improper body mechanics  Visit Diagnosis: Chronic bilateral low back pain with bilateral sciatica  Muscle weakness (generalized)  Pain in left hip  Pain in right hip  Unspecified lack of coordination  Cramp and spasm     Problem List Patient Active Problem List   Diagnosis Date Noted  . Hyperlipidemia 07/18/2017  . Ulnar neuropathy 04/18/2017  . GERD (gastroesophageal reflux disease) 10/19/2016  . Prediabetes 05/03/2016   . Cubital tunnel syndrome on right 03/01/2016  . Numbness and tingling of right arm 01/01/2016  . Chlamydia infection 01/27/2015  .  Smoking 12/26/2013  . Chronic lower back pain 04/13/2013  . Sciatica 04/13/2013  . Chronic neck pain 04/13/2013  . Spondylosis of cervical spine 04/13/2013    Cheryl King 03/16/2019, 10:03 AM  [place], [place], [address] Phone: [phone]   Fax:  [phone]  Name: Cheryl King MRN: [id number] Date of Birth: [birth year]  Raeford Razor, PT 03/16/19 10:03 AM Phone: [phone] Fax: [phone]

## 2019-03-20 ENCOUNTER — Ambulatory Visit: Payer: Medicaid Other | Admitting: Physical Therapy

## 2019-03-23 ENCOUNTER — Ambulatory Visit: Payer: Medicaid Other | Attending: Specialist | Admitting: Physical Therapy

## 2019-03-23 ENCOUNTER — Encounter: Payer: Self-pay | Admitting: Physical Therapy

## 2019-03-23 ENCOUNTER — Other Ambulatory Visit: Payer: Self-pay

## 2019-03-23 DIAGNOSIS — M25552 Pain in left hip: Secondary | ICD-10-CM | POA: Diagnosis not present

## 2019-03-23 DIAGNOSIS — R279 Unspecified lack of coordination: Secondary | ICD-10-CM | POA: Diagnosis not present

## 2019-03-23 DIAGNOSIS — G8929 Other chronic pain: Secondary | ICD-10-CM | POA: Insufficient documentation

## 2019-03-23 DIAGNOSIS — M25551 Pain in right hip: Secondary | ICD-10-CM | POA: Insufficient documentation

## 2019-03-23 DIAGNOSIS — R252 Cramp and spasm: Secondary | ICD-10-CM | POA: Diagnosis not present

## 2019-03-23 DIAGNOSIS — M5442 Lumbago with sciatica, left side: Secondary | ICD-10-CM | POA: Diagnosis not present

## 2019-03-23 DIAGNOSIS — M6281 Muscle weakness (generalized): Secondary | ICD-10-CM | POA: Diagnosis not present

## 2019-03-23 DIAGNOSIS — M5441 Lumbago with sciatica, right side: Secondary | ICD-10-CM | POA: Diagnosis not present

## 2019-03-23 NOTE — Therapy (Signed)
[place], [place], [address] Phone: [phone]   Fax:  [phone]  Physical Therapy Treatment  Patient Details  Name: Nalaya Pettaway MRN: [id number] Date of Birth: [birth year] Referring Provider (PT): Dr.  Louanne Skye    Encounter Date: 03/23/2019  PT End of Session - 03/23/19 0838    Visit Number  5    Number of Visits  16    Date for PT Re-Evaluation  04/23/19    Authorization Type  MCD    Authorization - Visit Number  0    Authorization - Number of Visits  12    PT Start Time  [id number]    PT Stop Time  0913    PT Time Calculation (min)  39 min    Activity Tolerance  Patient tolerated treatment well    Behavior During Therapy  [facility] for tasks assessed/performed       Past Medical History:  Diagnosis Date  . Acid reflux   . Allergy   . Carpal tunnel syndrome   . Depression   . Diverticulitis   . Hyperlipidemia   . Low back pain   . Neck pain   . 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 Sciatica   . Seasonal allergies     Past Surgical History:  Procedure Laterality Date  . ABDOMINAL SURGERY    . ANKLE SURGERY     lt.  Marland Kitchen PARTIAL HYSTERECTOMY    . right breast cyst removed     . URETHRAL DIVERTICULUM REPAIR     uretrral diverticultis    There were no vitals filed for this visit.  Subjective Assessment - 03/23/19 0841    Subjective  Patient having pain 6/10 in back and buttocks.  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 She would like to continue PT as surgery is not an option.    Pertinent History  pelvic floor, chronic pain, GERD, L ankle surgery    Limitations  Sitting;Walking;House hold activities;Standing    Currently in Pain?  Yes    Pain Score  6     Pain Location  Back    Pain Orientation  Lower    Pain Descriptors / Indicators  Tightness;Pressure;Discomfort    Pain Type  Chronic pain    Pain Radiating Towards  buttocks    Pain  Onset  More than a month ago    Pain Frequency  Constant    Multiple Pain Sites  No         [facility] PT Assessment - 03/23/19 0001      Assessment   Medical Diagnosis  DDD, bilateral greater trochanteric bursitis, spinal stenosis     Referring Provider (PT)  Dr.  Louanne Skye     Onset Date/Surgical Date  --   chronic    Next MD Visit  --    Prior Therapy  Yes      Precautions   Precautions  None      Restrictions   Weight Bearing Restrictions  No      Balance Screen   Has the patient fallen in the past 6 months  No      [place] residence    Living Arrangements  Alone    Type of [place] to enter    Entrance Stairs-Number of Steps  4  Entrance Stairs-Rails  Right    Home Equipment  Kasandra Knudsen - single point      Prior Function   Level of Independence  Independent with basic ADLs;Independent with household mobility without device;Independent with community mobility without device    Vocation  Unemployed    Leisure  watch TV, dog, music       Cognition   Overall Cognitive Status  Within Functional Limits for tasks assessed      Observation/Other Assessments   Focus on Therapeutic Outcomes (FOTO)   NT MCD      Sensation   Light Touch  Appears Intact    Additional Comments  gross numbness from time to time on bottoms of feet       Posture/Postural Control   Posture/Postural Control  Postural limitations    Postural Limitations  Rounded Shoulders;Forward head;Anterior pelvic tilt      AROM   Lumbar Flexion  50%    Lumbar Extension  50%    Lumbar - Right Side Bend  limited 25%    Lumbar - Left Side Bend  limited 50%     Lumbar - Right Rotation  stiff    Lumbar - Left Rotation  stiff       Strength   Overall Strength Comments  4/5 in hip flexion, Knees and ankle 5/5 DF     Right Hip ABduction  4/5    Left Hip ABduction  4/5    Right Knee Flexion  4+/5          [facility] Adult PT Treatment/Exercise -  03/23/19 0001      Lumbar Exercises: Stretches   Active Hamstring Stretch  3 reps;30 seconds    Single Knee to Chest Stretch  3 reps;30 seconds    Lower Trunk Rotation  10 seconds    Lower Trunk Rotation Limitations  x 10       Lumbar Exercises: Aerobic   Nustep  5 min L5 UE and LE       Lumbar Exercises: Supine   Pelvic Tilt  10 reps    Bent Knee Raise  10 reps    Bridge  10 reps    Bridge with clamshell  10 reps             PT Education - 03/23/19 1152    Education Details  importance of doing a regular HEP    Person(s) Educated  Patient    Methods  Explanation;Demonstration    Comprehension  Verbalized understanding       PT Short Term Goals - 03/23/19 0839      PT SHORT TERM GOAL #1   Title  Pt will be able to demonstrate proper core contraction in supine, seated and standing postures    Status  Achieved      PT SHORT TERM GOAL #2   Title  Pt will be independent in short term HEP for core, hips    Status  On-going        PT Long Term Goals - 03/23/19 0839      PT LONG TERM GOAL #1   Title  Pt will be able to show independence with HEP and compliance for long term success    Baseline  ongoing    Status  On-going    Target Date  05/04/19      PT LONG TERM GOAL #2   Title  Pt will be able to report standing, walking for 30 min with min increase in pain  from baseline (errands, fitness)    Baseline  maybe 10 min, not comfortable    Status  On-going    Target Date  05/04/19      PT LONG TERM GOAL #3   Title  Pt will be able to sit for meals 30 min without increasing back pain    Baseline  15 min    Status  On-going    Target Date  05/04/19      PT LONG TERM GOAL #4   Title  Pt will be able to demo 5/5 hip strength bilateral to support spine, core with standing, walking.    Baseline  4/5    Status  On-going    Target Date  05/04/19      PT LONG TERM GOAL #5   Title  Pt will be able to lift 25 lbs with good form and no increased pain    Baseline   does not do, painful    Status  On-going    Target Date  05/04/19            Plan - 03/23/19 0859    Clinical Impression Statement  Patient will benefit from skilled PT to improve chronic pain and functional limitations due to significant spinal stenois, non operative.  She has had some periods of pain relief, feels stronger with current PT.    Personal Factors and Comorbidities  Behavior Pattern;Comorbidity 1;Time since onset of injury/illness/exacerbation    Comorbidities  chronic pain, L ankle surgery, neck pain, pelvic floor weakness    Examination-Activity Limitations  Stand;Sleep;Squat;Locomotion Level;Carry;Bend    Examination-Participation Restrictions  Interpersonal Relationship;Yard Work;Community Activity;Shop;Meal Prep;Cleaning;Laundry    Stability/Clinical Decision Making  Evolving/Moderate complexity    Clinical Decision Making  Moderate    Rehab Potential  Good    PT Frequency  2x / week    PT Duration  8 weeks    PT Treatment/Interventions  ADLs/Self Care Home Management;Cryotherapy;Electrical Stimulation;Moist Heat;Traction;Ultrasound;DME Instruction;Iontophoresis 4mg /ml Dexamethasone;Functional mobility training;Balance training;Therapeutic activities;Therapeutic exercise;Patient/family education;Manual techniques;Passive range of motion;Dry needling;Taping;Neuromuscular re-education    PT Next Visit Plan  core in standing, hips strength, walking program?    PT Home Exercise Plan  seated piriformis, hip abd, childs pose, added bent knee raise, bridge, squats    Consulted and Agree with Plan of Care  Patient       Patient will benefit from skilled therapeutic intervention in order to improve the following deficits and impairments:  Decreased activity tolerance, Decreased range of motion, Decreased strength, Decreased mobility, Decreased balance, Difficulty walking, Impaired flexibility, Pain, Impaired UE functional use, Postural dysfunction, Increased fascial  restricitons, Impaired sensation, Improper body mechanics  Visit Diagnosis: Chronic bilateral low back pain with bilateral sciatica  Muscle weakness (generalized)  Pain in left hip  Pain in right hip  Unspecified lack of coordination  Cramp and spasm     Problem List Patient Active Problem List   Diagnosis Date Noted  . Hyperlipidemia 07/18/2017  . Ulnar neuropathy 04/18/2017  . GERD (gastroesophageal reflux disease) 10/19/2016  . Prediabetes 05/03/2016  . Cubital tunnel syndrome on right 03/01/2016  . Numbness and tingling of right arm 01/01/2016  . Chlamydia infection 01/27/2015  . Smoking 12/26/2013  . Chronic lower back pain 04/13/2013  . Sciatica 04/13/2013  . Chronic neck pain 04/13/2013  . Spondylosis of cervical spine 04/13/2013    Jonnell Hentges 03/23/2019, 11:52 AM  [place], [place], [address] Phone: [phone]   Fax:  [phone]  Name: Kamayah Bania MRN: [id number] Date of Birth: [birth year]  Raeford Razor, PT 03/23/19 11:52 AM Phone: [phone] Fax: [phone]

## 2019-03-27 ENCOUNTER — Ambulatory Visit: Payer: Medicaid Other | Admitting: Physical Therapy

## 2019-03-30 ENCOUNTER — Ambulatory Visit: Payer: Medicaid Other | Admitting: Physical Therapy

## 2019-04-02 MED FILL — AMITRIPTYLINE HCL 10 MG TAB: 10 | 30 days supply | Qty: 30 | Fill #1

## 2019-04-02 MED FILL — CYCLOBENZAPRINE 10 MG TAB: 10 | 30 days supply | Qty: 60 | Fill #1

## 2019-04-02 MED FILL — ATORVASTATIN CALCIUM 20 MG: 20 | 30 days supply | Qty: 30 | Fill #5

## 2019-04-03 ENCOUNTER — Ambulatory Visit: Payer: Medicaid Other | Admitting: Physical Therapy

## 2019-04-04 ENCOUNTER — Other Ambulatory Visit: Payer: Self-pay

## 2019-04-04 ENCOUNTER — Ambulatory Visit: Payer: Medicaid Other | Attending: Family Medicine | Admitting: Family Medicine

## 2019-04-04 DIAGNOSIS — M5442 Lumbago with sciatica, left side: Secondary | ICD-10-CM | POA: Diagnosis not present

## 2019-04-04 DIAGNOSIS — M5441 Lumbago with sciatica, right side: Secondary | ICD-10-CM | POA: Diagnosis not present

## 2019-04-04 DIAGNOSIS — N39498 Other specified urinary incontinence: Secondary | ICD-10-CM

## 2019-04-04 DIAGNOSIS — E7800 Pure hypercholesterolemia, unspecified: Secondary | ICD-10-CM | POA: Diagnosis not present

## 2019-04-04 DIAGNOSIS — G8929 Other chronic pain: Secondary | ICD-10-CM

## 2019-04-04 DIAGNOSIS — Z720 Tobacco use: Secondary | ICD-10-CM

## 2019-04-04 DIAGNOSIS — G44229 Chronic tension-type headache, not intractable: Secondary | ICD-10-CM

## 2019-04-04 MED ORDER — PREGABALIN 50 MG PO CAPS: 50.0000 mg | ORAL_CAPSULE | Freq: Every evening | ORAL | 6 refills | Status: DC | PRN

## 2019-04-04 MED ORDER — OXYBUTYNIN CHLORIDE 5 MG PO TABS: 5.0000 mg | ORAL_TABLET | Freq: Two times a day (BID) | ORAL | 6 refills | Status: DC

## 2019-04-04 MED ORDER — CYCLOBENZAPRINE HCL 10 MG PO TABS: 10.0000 mg | ORAL_TABLET | Freq: Two times a day (BID) | ORAL | 2 refills | Status: DC | PRN

## 2019-04-04 MED ORDER — DULOXETINE HCL 60 MG PO CPEP: 60.0000 mg | ORAL_CAPSULE | Freq: Every day | ORAL | 6 refills | Status: DC

## 2019-04-04 MED ORDER — ATORVASTATIN CALCIUM 20 MG PO TABS: 20.0000 mg | ORAL_TABLET | Freq: Every day | ORAL | 1 refills | Status: DC

## 2019-04-04 MED ORDER — AMITRIPTYLINE HCL 25 MG PO TABS: 25.0000 mg | ORAL_TABLET | Freq: Every day | ORAL | 3 refills | Status: DC

## 2019-04-04 MED FILL — OXYBUTYNIN 5 MG TABLET: 5 | 30 days supply | Qty: 60 | Fill #0

## 2019-04-04 MED FILL — PREGABALIN 50 MG CAPS: 50 | 30 days supply | Qty: 30 | Fill #0

## 2019-04-04 NOTE — Progress Notes (Signed)
Virtual Visit via Telephone Note  I connected with Cheryl King, on 04/04/2019 at 2:44 PM by telephone due to the COVID-19 pandemic and verified that I am speaking with the correct person using two identifiers.   Consent: I discussed the limitations, risks, security and privacy concerns of performing an evaluation and management service by telephone and the availability of in person appointments. I also discussed with the patient that there may be a patient responsible charge related to this service. The patient expressed understanding and agreed to proceed.   Location of Patient: Environmental education officer of Provider: Clinic   Persons participating in Telemedicine visit: Liel Lerer Farrington-CMA Dr. Margarita Rana     History of Present Illness: Cheryl King 54 year old female with Medical history significant for Prediabetes (diet controlled A1c 5.8) chronic low back pain with sciatica, GERD, hyperlipidemia tobacco abuse here for a follow-up visit. She received b/l hip shots for her bursitis with relief. She completed pelvic floor rehab but still has urine urge and stress incontinence .  She still has 8/10 pain in her neck and back and is currently on Lyrica, Cymbalta, Flexeril and is undergoing PT.  Also being followed by a spine specialist Dr. Louanne Skye.  For her migraines she is requesting an increase in dose of amitriptyline as she is having to take 2 tablets for relief. She will be seeing a therapist for her anxiety and Depression in the near future. Smokes > half pack cig/day.  Past Medical History:  Diagnosis Date  . Acid reflux   . Allergy   . Carpal tunnel syndrome   . Depression   . Diverticulitis   . Hyperlipidemia   . Low back pain   . Neck pain   . Sacroiliac inflammation ([place])   . Sciatica   . Seasonal allergies    Allergies  Allergen Reactions  . Percocet [Oxycodone-Acetaminophen] Nausea Only  . Vicodin [Hydrocodone-Acetaminophen] Nausea Only   . Penicillins Rash    Current Outpatient Medications on File Prior to Visit  Medication Sig Dispense Refill  . amitriptyline (ELAVIL) 10 MG tablet Take 10 mg by mouth at bedtime.    Marland Kitchen atorvastatin (LIPITOR) 20 MG tablet Take 1 tablet (20 mg total) by mouth daily. 90 tablet 1  . cetirizine (ZYRTEC) 10 MG tablet Take 1 tablet (10 mg total) by mouth daily. 90 tablet 1  . cyclobenzaprine (FLEXERIL) 10 MG tablet TAKE 1 TABLET (10 MG TOTAL) BY MOUTH 2 (TWO) TIMES DAILY AS NEEDED FOR MUSCLE SPASMS. 60 tablet 2  . diclofenac sodium (VOLTAREN) 1 % GEL Apply 2 g topically 4 (four) times daily. 5 Tube 2  . DULoxetine (CYMBALTA) 60 MG capsule Take 1 capsule (60 mg total) by mouth daily. 30 capsule 6  . fluticasone (FLONASE) 50 MCG/ACT nasal spray PLACE 2 SPRAYS INTO BOTH NOSTRILS DAILY. 16 g 2  . Lactobacillus CAPS Take 1 capsule by mouth daily.    . Multiple Vitamin (MULTIVITAMIN WITH MINERALS) TABS tablet Take 1 tablet by mouth daily.    . naproxen (NAPROSYN) 500 MG tablet TAKE 1 TABLET BY MOUTH 2 TIMES DAILY WITH A MEAL. 60 tablet 3  . ondansetron (ZOFRAN) 4 MG tablet TAKE 1 TABLET BY MOUTH EVERY 8 HOURS AS NEEDED FOR NAUSEA OR VOMITING. 60 tablet 1  . pregabalin (LYRICA) 50 MG capsule TAKE 1 CAPSULE BY MOUTH EVERYDAY AT BEDTIME 30 capsule 2  . RESTASIS 0.05 % ophthalmic emulsion   3  . traMADol (ULTRAM) 50 MG tablet Take by mouth every 6 (  six) hours as needed.    . triamcinolone cream (KENALOG) 0.1 % Apply 1 application topically 2 (two) times daily. 45 g 1  . nicotine (NICODERM CQ) 14 mg/24hr patch Place 1 patch (14 mg total) onto the skin daily. For 4 weeks then 7mg /24hr daily for  4 weeks (Patient not taking: Reported on 04/04/2019) 28 patch 1   Current Facility-Administered Medications on File Prior to Visit  Medication Dose Route Frequency Provider Last Rate Last Admin  . 0.9 %  sodium chloride infusion  500 mL Intravenous Once Nelida Meuse III, MD        Observations/Objective: Awake,  alert, oriented x3 Not in acute distress   Lipid Panel     Component Value Date/Time   CHOL 202 (H) 09/20/2018 1011   TRIG 83 09/20/2018 1011   HDL 67 09/20/2018 1011   CHOLHDL 3.0 09/20/2018 1011   CHOLHDL 3.2 12/26/2013 1319   VLDL 18 12/26/2013 1319   LDLCALC 120 (H) 09/20/2018 1011   LABVLDL 15 09/20/2018 1011    Assessment and Plan: 1. Chronic midline low back pain with bilateral sciatica Uncontrolled but stable Continue PT Follow-up with spine surgeon - pregabalin (LYRICA) 50 MG capsule; Take 1 capsule (50 mg total) by mouth at bedtime as needed.  Dispense: 30 capsule; Refill: 6 - cyclobenzaprine (FLEXERIL) 10 MG tablet; Take 1 tablet (10 mg total) by mouth 2 (two) times daily as needed for muscle spasms.  Dispense: 60 tablet; Refill: 2 - DULoxetine (CYMBALTA) 60 MG capsule; Take 1 capsule (60 mg total) by mouth daily.  Dispense: 30 capsule; Refill: 6  2. Pure hypercholesterolemia Stable Low-cholesterol diet - atorvastatin (LIPITOR) 20 MG tablet; Take 1 tablet (20 mg total) by mouth daily.  Dispense: 90 tablet; Refill: 1  3. Tobacco abuse Spent 3 minutes counseling on smoking cessation and hazardous effect of smoking She is working on quitting  4. Other urinary incontinence Uncontrolled Discussed Kegel exercises We will commence oxybutynin-side effects discussed - oxybutynin (DITROPAN) 5 MG tablet; Take 1 tablet (5 mg total) by mouth 2 (two) times daily.  Dispense: 60 tablet; Refill: 6  5. Chronic tension-type headache, not intractable Uncontrolled Increase amitriptyline to 25 mg - amitriptyline (ELAVIL) 25 MG tablet; Take 1 tablet (25 mg total) by mouth at bedtime.  Dispense: 30 tablet; Refill: 3   Follow Up Instructions: Return in about 3 months (around 07/05/2019) for Medical conditions-in person.    I discussed the assessment and treatment plan with the patient. The patient was provided an opportunity to ask questions and all were answered. The patient  agreed with the plan and demonstrated an understanding of the instructions.   The patient was advised to call back or seek an in-person evaluation if the symptoms worsen or if the condition fails to improve as anticipated.     I provided 15 minutes total of non-face-to-face time during this encounter including median intraservice time, reviewing previous notes, investigations, ordering medications, medical decision making, coordinating care and patient verbalized understanding at the end of the visit.     Charlott Rakes, MD, FAAFP. [facility] and Sarita Berkley, [place]   04/04/2019, 2:44 PM

## 2019-04-05 ENCOUNTER — Encounter: Payer: Self-pay | Admitting: Family Medicine

## 2019-04-06 ENCOUNTER — Other Ambulatory Visit: Payer: Self-pay

## 2019-04-06 ENCOUNTER — Ambulatory Visit: Payer: Medicaid Other | Admitting: Physical Therapy

## 2019-04-06 ENCOUNTER — Encounter: Payer: Self-pay | Admitting: Physical Therapy

## 2019-04-06 DIAGNOSIS — R279 Unspecified lack of coordination: Secondary | ICD-10-CM | POA: Diagnosis not present

## 2019-04-06 DIAGNOSIS — R252 Cramp and spasm: Secondary | ICD-10-CM

## 2019-04-06 DIAGNOSIS — M6281 Muscle weakness (generalized): Secondary | ICD-10-CM | POA: Diagnosis not present

## 2019-04-06 DIAGNOSIS — M5442 Lumbago with sciatica, left side: Secondary | ICD-10-CM | POA: Diagnosis not present

## 2019-04-06 DIAGNOSIS — M5441 Lumbago with sciatica, right side: Secondary | ICD-10-CM | POA: Diagnosis not present

## 2019-04-06 DIAGNOSIS — M25551 Pain in right hip: Secondary | ICD-10-CM

## 2019-04-06 DIAGNOSIS — M25552 Pain in left hip: Secondary | ICD-10-CM | POA: Diagnosis not present

## 2019-04-06 DIAGNOSIS — G8929 Other chronic pain: Secondary | ICD-10-CM

## 2019-04-06 NOTE — Therapy (Signed)
Cheryl King [place], [place], [address] Phone: [phone]   Fax:  [phone]  Physical Therapy Treatment  Patient Details  Name: Cheryl King MRN: [id number] Date of Birth: 1965/12/22 Referring Provider (PT): Dr.  Louanne Skye    Encounter Date: 04/06/2019  PT End of Session - 04/06/19 0839    Visit Number  6    Number of Visits  16    Date for PT Re-Evaluation  04/23/19    Authorization Type  MCD    Authorization Time Period  3/16 to 4/26    Authorization - Visit Number  1    Authorization - Number of Visits  12    PT Start Time  0832    PT Stop Time  0915    PT Time Calculation (min)  43 min    Activity Tolerance  Patient tolerated treatment well    Behavior During Therapy  [facility] for tasks assessed/performed       Past Medical History:  Diagnosis Date  . Acid reflux   . Allergy   . Carpal tunnel syndrome   . Depression   . Diverticulitis   . Hyperlipidemia   . Low back pain   . Neck pain   . Sacroiliac inflammation ([place])   . Sciatica   . Seasonal allergies     Past Surgical History:  Procedure Laterality Date  . ABDOMINAL SURGERY    . ANKLE SURGERY     lt.  Marland Kitchen PARTIAL HYSTERECTOMY    . right breast cyst removed     . URETHRAL DIVERTICULUM REPAIR     uretrral diverticultis    There were no vitals filed for this visit.  Subjective Assessment - 04/06/19 0839    Subjective  Same old same old.  Back is not hurting right now.    Currently in Pain?  No/denies       [facility] Adult PT Treatment/Exercise - 04/06/19 0001      Lumbar Exercises: Aerobic   Nustep  5 min L5 UE and LE       Lumbar Exercises: Standing   Functional Squats  10 reps    Functional Squats Limitations  3 sets 10 lbs     Lifting Weights (lbs)  single leg hip hinge x 10 with UE support     Wall Slides Limitations  UE core work in partial squat : red band diagonals x 10 and horizontal abd red x 15       Lumbar Exercises:  Supine   Bridge  10 reps    Bridge with clamshell  10 reps    Bridge with March  10 reps      Knee/Hip Exercises: Stretches   Active Hamstring Stretch  Both;2 reps;30 seconds    ITB Stretch  Both;2 reps    Piriformis Stretch  2 reps;30 seconds               PT Short Term Goals - 03/23/19 0839      PT SHORT TERM GOAL #1   Title  Pt will be able to demonstrate proper core contraction in supine, seated and standing postures    Status  Achieved      PT SHORT TERM GOAL #2   Title  Pt will be independent in short term HEP for core, hips    Status  On-going        PT Long Term Goals - 03/23/19 0839      PT LONG TERM GOAL #  1   Title  Pt will be able to show independence with HEP and compliance for long term success    Baseline  ongoing    Status  On-going    Target Date  05/04/19      PT LONG TERM GOAL #2   Title  Pt will be able to report standing, walking for 30 min with min increase in pain from baseline (errands, fitness)    Baseline  maybe 10 min, not comfortable    Status  On-going    Target Date  05/04/19      PT LONG TERM GOAL #3   Title  Pt will be able to sit for meals 30 min without increasing back pain    Baseline  15 min    Status  On-going    Target Date  05/04/19      PT LONG TERM GOAL #4   Title  Pt will be able to demo 5/5 hip strength bilateral to support spine, core with standing, walking.    Baseline  4/5    Status  On-going    Target Date  05/04/19      PT LONG TERM GOAL #5   Title  Pt will be able to lift 25 lbs with good form and no increased pain    Baseline  does not do, painful    Status  On-going    Target Date  05/04/19            Plan - 04/06/19 0947    Clinical Impression Statement  Pt able to demo good lifting technique with light weight.  Back pain did not interfere with her more intermediate exercises.  Declined modalities.    PT Treatment/Interventions  ADLs/Self Care Home Management;Cryotherapy;Electrical  Stimulation;Moist Heat;Traction;Ultrasound;DME Instruction;Iontophoresis 4mg /ml Dexamethasone;Functional mobility training;Balance training;Therapeutic activities;Therapeutic exercise;Patient/family education;Manual techniques;Passive range of motion;Dry needling;Taping;Neuromuscular re-education    PT Next Visit Plan  core in standing, hips strength, walking program?    PT Home Exercise Plan  seated piriformis, hip abd, childs pose, added bent knee raise, bridge, squats       Patient will benefit from skilled therapeutic intervention in order to improve the following deficits and impairments:  Decreased activity tolerance, Decreased range of motion, Decreased strength, Decreased mobility, Decreased balance, Difficulty walking, Impaired flexibility, Pain, Impaired UE functional use, Postural dysfunction, Increased fascial restricitons, Impaired sensation, Improper body mechanics  Visit Diagnosis: Chronic bilateral low back pain with bilateral sciatica  Muscle weakness (generalized)  Pain in left hip  Pain in right hip  Unspecified lack of coordination  Cramp and spasm     Problem List Patient Active Problem List   Diagnosis Date Noted  . Hyperlipidemia 07/18/2017  . Ulnar neuropathy 04/18/2017  . GERD (gastroesophageal reflux disease) 10/19/2016  . Prediabetes 05/03/2016  . Cubital tunnel syndrome on right 03/01/2016  . Numbness and tingling of right arm 01/01/2016  . Chlamydia infection 01/27/2015  . Smoking 12/26/2013  . Chronic lower back pain 04/13/2013  . Sciatica 04/13/2013  . Chronic neck pain 04/13/2013  . Spondylosis of cervical spine 04/13/2013    Cheryl King 04/06/2019, 9:50 AM  [place], [place], [address] Phone: [phone]   Fax:  [phone]  Name: Cheryl King MRN: [id number] Date of Birth: August 06, 1965  Raeford Razor, PT 04/06/19 9:50 AM Phone: [phone] Fax:  [phone]

## 2019-04-09 MED FILL — DULoxetine HCL 60 MG CPEP: 60 | 30 days supply | Qty: 30 | Fill #4

## 2019-04-09 MED FILL — NAPROXEN 500 MG TABLET: 500 | 30 days supply | Qty: 60 | Fill #3

## 2019-04-10 ENCOUNTER — Ambulatory Visit: Payer: Medicaid Other | Admitting: Physical Therapy

## 2019-04-10 ENCOUNTER — Other Ambulatory Visit: Payer: Self-pay

## 2019-04-10 ENCOUNTER — Encounter: Payer: Self-pay | Admitting: Physical Therapy

## 2019-04-10 DIAGNOSIS — R279 Unspecified lack of coordination: Secondary | ICD-10-CM | POA: Diagnosis not present

## 2019-04-10 DIAGNOSIS — M25551 Pain in right hip: Secondary | ICD-10-CM

## 2019-04-10 DIAGNOSIS — M6281 Muscle weakness (generalized): Secondary | ICD-10-CM

## 2019-04-10 DIAGNOSIS — M25552 Pain in left hip: Secondary | ICD-10-CM | POA: Diagnosis not present

## 2019-04-10 DIAGNOSIS — M5442 Lumbago with sciatica, left side: Secondary | ICD-10-CM

## 2019-04-10 DIAGNOSIS — R252 Cramp and spasm: Secondary | ICD-10-CM

## 2019-04-10 DIAGNOSIS — G8929 Other chronic pain: Secondary | ICD-10-CM

## 2019-04-10 DIAGNOSIS — M5441 Lumbago with sciatica, right side: Secondary | ICD-10-CM | POA: Diagnosis not present

## 2019-04-10 NOTE — Therapy (Signed)
[place], [place], [address] Phone: [phone]   Fax:  [phone]  Physical Therapy Treatment  Patient Details  Name: Cheryl King MRN: [id number] Date of Birth: 08-19-1965 Referring Provider (PT): Dr.  Louanne Skye    Encounter Date: 04/10/2019  PT End of Session - 04/10/19 1002    Visit Number  7    Number of Visits  16    Date for PT Re-Evaluation  04/23/19    Authorization Type  MCD    Authorization Time Period  3/16 to 4/26    Authorization - Visit Number  2    Authorization - Number of Visits  12    PT Start Time  1000    PT Stop Time  1058    PT Time Calculation (min)  58 min    Activity Tolerance  Patient tolerated treatment well    Behavior During Therapy  [facility] for tasks assessed/performed       Past Medical History:  Diagnosis Date  . Acid reflux   . Allergy   . Carpal tunnel syndrome   . Depression   . Diverticulitis   . Hyperlipidemia   . Low back pain   . Neck pain   . Sacroiliac inflammation ([place])   . Sciatica   . Seasonal allergies     Past Surgical History:  Procedure Laterality Date  . ABDOMINAL SURGERY    . ANKLE SURGERY     lt.  Marland Kitchen PARTIAL HYSTERECTOMY    . right breast cyst removed     . URETHRAL DIVERTICULUM REPAIR     uretrral diverticultis    There were no vitals filed for this visit.  Subjective Assessment - 04/10/19 1059    Subjective  I was sore after lifting but in my legs mostly.  Back is ok, its  6/10/    Currently in Pain?  Yes    Pain Score  6     Pain Location  Back    Pain Orientation  Lower    Pain Descriptors / Indicators  Aching;Tightness    Pain Type  Chronic pain    Pain Onset  More than a month ago    Pain Frequency  Constant    Aggravating Factors   weather, housework    Pain Relieving Factors  stretch, xhange positions         [facility] Adult PT Treatment/Exercise - 04/10/19 0001      Lumbar Exercises: Stretches   Pelvic Tilt  10  reps    Pelvic Tilt Limitations  ball under pelvis       Lumbar Exercises: Standing   Lifting Weights (lbs)  single leg hip hinge x 10 with UE support     Other Standing Lumbar Exercises  SLS on foam with toe tap, UE assist needed     Other Standing Lumbar Exercises  in tandem, standing UE dumbbells, forward and lateral raise 3 lbs each arm       Lumbar Exercises: Supine   Clam  20 reps    Clam Limitations  ball     Bent Knee Raise  10 reps    Bent Knee Raise Limitations  ball under pelvis     Advanced Lumbar Stabilization Limitations  ball under pelvis: hip and knee extension    x 10   Other Supine Lumbar Exercises  lower abd heel tap with ball from table top.       Knee/Hip Exercises: Stretches   Piriformis  Stretch  2 reps;30 seconds      Moist Heat Therapy   Number Minutes Moist Heat  10 Minutes    Moist Heat Location  Lumbar Spine;Hip      Manual Therapy   Soft tissue mobilization  bilateral lumbar paraspinals and glutes, very painful/spasm in piriformis bilateral     Myofascial Release  hips and back                PT Short Term Goals - 03/23/19 0839      PT SHORT TERM GOAL #1   Title  Pt will be able to demonstrate proper core contraction in supine, seated and standing postures    Status  Achieved      PT SHORT TERM GOAL #2   Title  Pt will be independent in short term HEP for core, hips    Status  On-going        PT Long Term Goals - 03/23/19 0839      PT LONG TERM GOAL #1   Title  Pt will be able to show independence with HEP and compliance for long term success    Baseline  ongoing    Status  On-going    Target Date  05/04/19      PT LONG TERM GOAL #2   Title  Pt will be able to report standing, walking for 30 min with min increase in pain from baseline (errands, fitness)    Baseline  maybe 10 min, not comfortable    Status  On-going    Target Date  05/04/19      PT LONG TERM GOAL #3   Title  Pt will be able to sit for meals 30 min without  increasing back pain    Baseline  15 min    Status  On-going    Target Date  05/04/19      PT LONG TERM GOAL #4   Title  Pt will be able to demo 5/5 hip strength bilateral to support spine, core with standing, walking.    Baseline  4/5    Status  On-going    Target Date  05/04/19      PT LONG TERM GOAL #5   Title  Pt will be able to lift 25 lbs with good form and no increased pain    Baseline  does not do, painful    Status  On-going    Target Date  05/04/19            Plan - 04/10/19 1003    Clinical Impression Statement  Patient able to tolerate standing core strength and balance with some challenge but no increased pain. Utilized soft tissue work to ease pain and soreness post session.  Spasm in glutes with manual.    PT Treatment/Interventions  ADLs/Self Care Home Management;Cryotherapy;Electrical Stimulation;Moist Heat;Traction;Ultrasound;DME Instruction;Iontophoresis 4mg /ml Dexamethasone;Functional mobility training;Balance training;Therapeutic activities;Therapeutic exercise;Patient/family education;Manual techniques;Passive range of motion;Dry needling;Taping;Neuromuscular re-education    PT Next Visit Plan  core in standing, hips strength, walking program?    PT Home Exercise Plan  seated piriformis, hip abd, childs pose, added bent knee raise, bridge, squats       Patient will benefit from skilled therapeutic intervention in order to improve the following deficits and impairments:  Decreased activity tolerance, Decreased range of motion, Decreased strength, Decreased mobility, Decreased balance, Difficulty walking, Impaired flexibility, Pain, Impaired UE functional use, Postural dysfunction, Increased fascial restricitons, Impaired sensation, Improper body mechanics  Visit Diagnosis: Chronic bilateral low back  pain with bilateral sciatica  Muscle weakness (generalized)  Pain in left hip  Pain in right hip  Unspecified lack of coordination  Cramp and  spasm     Problem List Patient Active Problem List   Diagnosis Date Noted  . Hyperlipidemia 07/18/2017  . Ulnar neuropathy 04/18/2017  . GERD (gastroesophageal reflux disease) 10/19/2016  . Prediabetes 05/03/2016  . Cubital tunnel syndrome on right 03/01/2016  . Numbness and tingling of right arm 01/01/2016  . Chlamydia infection 01/27/2015  . Smoking 12/26/2013  . Chronic lower back pain 04/13/2013  . Sciatica 04/13/2013  . Chronic neck pain 04/13/2013  . Spondylosis of cervical spine 04/13/2013    Janaisa Birkland 04/10/2019, 11:00 AM  [place], [place], [address] Phone: [phone]   Fax:  [phone]  Name: Caitlen Monaco MRN: [id number] Date of Birth: 11-09-1965  Raeford Razor, PT 04/10/19 11:00 AM Phone: [phone] Fax: [phone]

## 2019-04-13 ENCOUNTER — Other Ambulatory Visit: Payer: Self-pay

## 2019-04-13 ENCOUNTER — Ambulatory Visit: Payer: Medicaid Other | Admitting: Physical Therapy

## 2019-04-13 DIAGNOSIS — R252 Cramp and spasm: Secondary | ICD-10-CM | POA: Diagnosis not present

## 2019-04-13 DIAGNOSIS — M25552 Pain in left hip: Secondary | ICD-10-CM

## 2019-04-13 DIAGNOSIS — M5441 Lumbago with sciatica, right side: Secondary | ICD-10-CM | POA: Diagnosis not present

## 2019-04-13 DIAGNOSIS — M5442 Lumbago with sciatica, left side: Secondary | ICD-10-CM

## 2019-04-13 DIAGNOSIS — R279 Unspecified lack of coordination: Secondary | ICD-10-CM | POA: Diagnosis not present

## 2019-04-13 DIAGNOSIS — M25551 Pain in right hip: Secondary | ICD-10-CM

## 2019-04-13 DIAGNOSIS — G8929 Other chronic pain: Secondary | ICD-10-CM

## 2019-04-13 DIAGNOSIS — M6281 Muscle weakness (generalized): Secondary | ICD-10-CM

## 2019-04-13 NOTE — Therapy (Addendum)
[place], [place], [address] Phone: [phone]   Fax:  [phone]  Physical Therapy Treatment/Renewal  Patient Details  Name: Cheryl King MRN: [id number] Date of Birth: 1966/01/10 Referring Provider (PT): Dr.  Louanne Skye    Encounter Date: 04/13/2019  PT End of Session - 04/13/19 0838    Visit Number  8    Number of Visits  16    Date for PT Re-Evaluation  05/25/2019    Authorization Type  MCD    Authorization - Visit Number  3    Authorization - Number of Visits  12    PT Start Time  0830    PT Stop Time  0909    PT Time Calculation (min)  39 min    Activity Tolerance  Patient tolerated treatment well    Behavior During Therapy  [facility] for tasks assessed/performed       Past Medical History:  Diagnosis Date  . Acid reflux   . Allergy   . Carpal tunnel syndrome   . Depression   . Diverticulitis   . Hyperlipidemia   . Low back pain   . Neck pain   . 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 Sciatica   . Seasonal allergies     Past Surgical History:  Procedure Laterality Date  . ABDOMINAL SURGERY    . ANKLE SURGERY     lt.  Marland Kitchen PARTIAL HYSTERECTOMY    . right breast cyst removed     . URETHRAL DIVERTICULUM REPAIR     uretrral diverticultis    There were no vitals filed for this visit.      [facility] PT Assessment - 04/13/19 0001      AROM   Lumbar Flexion  20% no pain     Lumbar Extension  25% with discomfort     Lumbar - Right Side Bend  10% no pain     Lumbar - Left Side Bend  10% no pain     Lumbar - Right Rotation  WFL    Lumbar - Left Rotation  [facility]       Strength   Overall Strength Comments  4/5 hip flexion     Right Hip Flexion  3+/5    Right Hip ABduction  4/5    Left Hip Flexion  3+/5    Left Hip ABduction  4/5    Right Knee Flexion  4+/5    Right Knee Extension  5/5    Left Knee Flexion  4+/5    Left Knee Extension  5/5        [facility] Adult PT Treatment/Exercise -  04/13/19 0001      Lumbar Exercises: Stretches   Single Knee to Chest Stretch  3 reps;30 seconds    Figure 4 Stretch  3 reps;30 seconds      Lumbar Exercises: Machines for Strengthening   Leg Press  1 plate narrow parallel and then wide , hips ER 2 plates x 15 each       Lumbar Exercises: Sidelying   Clam  Both;15 reps    Hip Abduction  Both;10 reps    Hip Abduction Weights (lbs)   2 sets       Knee/Hip Exercises: Standing   Lateral Step Up  Both;1 set;15 reps;Hand Hold: 1;Step Height: 8"           PT Short Term Goals - 04/13/19 0838      PT SHORT TERM GOAL #1  Title  Pt will be able to demonstrate proper core contraction in supine, seated and standing postures    Status  Achieved      PT SHORT TERM GOAL #2   Title  Pt will be independent in short term HEP for core, hips    Status  Achieved        PT Long Term Goals - 04/13/19 0840      PT LONG TERM GOAL #1   Title  Pt will be able to show independence with HEP and compliance for long term success    Status  On-going      PT LONG TERM GOAL #2   Title  Pt will be able to report standing, walking for 30 min with min increase in pain from baseline (errands, fitness)    Baseline  15-20 min, pain increases moderately    Status  On-going      PT LONG TERM GOAL #3   Title  Pt will be able to sit for meals 30 min without increasing back pain    Baseline  15 min    Status  On-going      PT LONG TERM GOAL #4   Title  Pt will be able to demo 5/5 hip strength bilateral to support spine, core with standing, walking.      PT LONG TERM GOAL #5   Title  Pt will be able to lift 25 lbs with good form and no increased pain    Baseline  has good days and bad    Status  On-going            Plan - 04/13/19 0902    Clinical Impression Statement  Patient has shown improvement in AROM of trunk and functional mobility.  Cont to lack strength in hips, core.  She will cont PT but 1x per week as she is fairly pleased with her  current level of function.  Declined modalities post session.    PT Treatment/Interventions  ADLs/Self Care Home Management;Cryotherapy;Electrical Stimulation;Moist Heat;Traction;Ultrasound;DME Instruction;Iontophoresis 4mg /ml Dexamethasone;Functional mobility training;Balance training;Therapeutic activities;Therapeutic exercise;Patient/family education;Manual techniques;Passive range of motion;Dry needling;Taping;Neuromuscular re-education    PT Next Visit Plan  core in standing, hips strength, walking program?    PT Home Exercise Plan  seated piriformis, hip abd, childs pose, added bent knee raise, bridge, squats    Consulted and Agree with Plan of Care  Patient       Patient will benefit from skilled therapeutic intervention in order to improve the following deficits and impairments:  Decreased activity tolerance, Decreased range of motion, Decreased strength, Decreased mobility, Decreased balance, Difficulty walking, Impaired flexibility, Pain, Impaired UE functional use, Postural dysfunction, Increased fascial restricitons, Impaired sensation, Improper body mechanics  Visit Diagnosis: Chronic bilateral low back pain with bilateral sciatica  Muscle weakness (generalized)  Pain in left hip  Pain in right hip     Problem List Patient Active Problem List   Diagnosis Date Noted  . Hyperlipidemia 07/18/2017  . Ulnar neuropathy 04/18/2017  . GERD (gastroesophageal reflux disease) 10/19/2016  . Prediabetes 05/03/2016  . Cubital tunnel syndrome on right 03/01/2016  . Numbness and tingling of right arm 01/01/2016  . Chlamydia infection 01/27/2015  . Smoking 12/26/2013  . Chronic lower back pain 04/13/2013  . Sciatica 04/13/2013  . Chronic neck pain 04/13/2013  . Spondylosis of cervical spine 04/13/2013    [name] 04/13/2019, 9:13 AM  [place], [place], [address] Phone: [phone]  Fax:   [phone]  Name: Cheryl King MRN: [id number] Date of Birth: 1965-04-28  Raeford Razor, PT 04/13/19 9:13 AM Phone: [phone] Fax: [phone]

## 2019-04-26 NOTE — Addendum Note (Signed)
Addended by: Raeford Razor L on: 04/26/2019 01:07 PM   Modules accepted: Orders

## 2019-04-27 ENCOUNTER — Ambulatory Visit: Payer: Medicaid Other | Admitting: Physical Therapy

## 2019-04-30 ENCOUNTER — Ambulatory Visit: Payer: Medicaid Other | Attending: Specialist | Admitting: Physical Therapy

## 2019-04-30 ENCOUNTER — Encounter: Payer: Self-pay | Admitting: Physical Therapy

## 2019-04-30 ENCOUNTER — Other Ambulatory Visit: Payer: Self-pay | Admitting: Family Medicine

## 2019-04-30 ENCOUNTER — Other Ambulatory Visit: Payer: Self-pay

## 2019-04-30 DIAGNOSIS — G44209 Tension-type headache, unspecified, not intractable: Secondary | ICD-10-CM

## 2019-04-30 DIAGNOSIS — M25551 Pain in right hip: Secondary | ICD-10-CM | POA: Diagnosis not present

## 2019-04-30 DIAGNOSIS — G8929 Other chronic pain: Secondary | ICD-10-CM | POA: Insufficient documentation

## 2019-04-30 DIAGNOSIS — R279 Unspecified lack of coordination: Secondary | ICD-10-CM | POA: Insufficient documentation

## 2019-04-30 DIAGNOSIS — M6281 Muscle weakness (generalized): Secondary | ICD-10-CM | POA: Insufficient documentation

## 2019-04-30 DIAGNOSIS — R252 Cramp and spasm: Secondary | ICD-10-CM | POA: Insufficient documentation

## 2019-04-30 DIAGNOSIS — M25552 Pain in left hip: Secondary | ICD-10-CM | POA: Diagnosis not present

## 2019-04-30 DIAGNOSIS — M5441 Lumbago with sciatica, right side: Secondary | ICD-10-CM | POA: Diagnosis not present

## 2019-04-30 DIAGNOSIS — M5442 Lumbago with sciatica, left side: Secondary | ICD-10-CM | POA: Insufficient documentation

## 2019-04-30 MED FILL — ATORVASTATIN CALCIUM 20 MG: 20 | 90 days supply | Qty: 90 | Fill #0

## 2019-04-30 NOTE — Therapy (Signed)
[place], [place], [address] Phone: [phone]   Fax:  [phone]  Physical Therapy Treatment  Patient Details  Name: Cheryl King MRN: [id number] Date of Birth: 05-20-1965 Referring Provider (PT): Dr.  Louanne Skye    Encounter Date: 04/30/2019  PT End of Session - 04/30/19 0818    Visit Number  9    Number of Visits  16    Date for PT Re-Evaluation  05/25/19    Authorization Type  MCD    Authorization Time Period  3/16 to 4/26    Authorization - Visit Number  4    Authorization - Number of Visits  12    PT Start Time  0745    PT Stop Time  0830    PT Time Calculation (min)  45 min    Activity Tolerance  Patient tolerated treatment well    Behavior During Therapy  [facility] for tasks assessed/performed       Past Medical History:  Diagnosis Date  . Acid reflux   . Allergy   . Carpal tunnel syndrome   . Depression   . Diverticulitis   . Hyperlipidemia   . Low back pain   . Neck pain   . 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 Sciatica   . Seasonal allergies     Past Surgical History:  Procedure Laterality Date  . ABDOMINAL SURGERY    . ANKLE SURGERY     lt.  Marland Kitchen PARTIAL HYSTERECTOMY    . right breast cyst removed     . URETHRAL DIVERTICULUM REPAIR     uretrral diverticultis    There were no vitals filed for this visit.  Subjective Assessment - 04/30/19 0747    Subjective  I mowed the lawn last week and its still hurting, spasm. I have no one else to help me with the yard.    Currently in Pain?  Yes    Pain Score  6     Pain Location  Back    Pain Orientation  Lower;Left    Pain Descriptors / Indicators  Spasm;Tightness    Pain Type  Chronic pain    Pain Onset  More than a month ago    Pain Frequency  Constant    Aggravating Factors   activity, lifting, weather, housework    Pain Relieving Factors  heat, stretching       [facility] Adult PT Treatment/Exercise - 04/30/19 0001      Lumbar  Exercises: Sidelying   Other Sidelying Lumbar Exercises  QL , L. trunk stretch for passive stretch      Moist Heat Therapy   Number Minutes Moist Heat  15 Minutes    Moist Heat Location  Lumbar Spine;Hip      Electrical Stimulation   Electrical Stimulation Location  lumbar , L hip     Electrical Stimulation Action  IFC     Electrical Stimulation Parameters  30 Ma     Electrical Stimulation Goals  Pain      Manual Therapy   Manual Therapy  Passive ROM    Manual therapy comments  in sidelying and then prone     Soft tissue mobilization  L TFL, glute med, piriformis and bilateral lumbar paraspinals    Myofascial Release  lumbar, L hip     Passive ROM  L hip ER and IR with compression             PT Education - 04/30/19 [id number]  Education Details  piriformis stretch and IFC    Person(s) Educated  Patient    Methods  Explanation;Demonstration    Comprehension  Verbalized understanding       PT Short Term Goals - 04/13/19 [id number]      PT SHORT TERM GOAL #1   Title  Pt will be able to demonstrate proper core contraction in supine, seated and standing postures    Status  Achieved      PT SHORT TERM GOAL #2   Title  Pt will be independent in short term HEP for core, hips    Status  Achieved        PT Long Term Goals - 04/13/19 0840      PT LONG TERM GOAL #1   Title  Pt will be able to show independence with HEP and compliance for long term success    Status  On-going      PT LONG TERM GOAL #2   Title  Pt will be able to report standing, walking for 30 min with min increase in pain from baseline (errands, fitness)    Baseline  15-20 min, pain increases moderately    Status  On-going      PT LONG TERM GOAL #3   Title  Pt will be able to sit for meals 30 min without increasing back pain    Baseline  15 min    Status  On-going      PT LONG TERM GOAL #4   Title  Pt will be able to demo 5/5 hip strength bilateral to support spine, core with standing, walking.      PT  LONG TERM GOAL #5   Title  Pt will be able to lift 25 lbs with good form and no increased pain    Baseline  has good days and bad    Status  On-going            Plan - 04/30/19 0818    Clinical Impression Statement  Opted for manual and modalities today due to pain.  She had several tender, painful areas in L glute med, piriformis.  She was renewed last visit but no visits available  until now. Pain improved by 50% or more post session.    PT Treatment/Interventions  ADLs/Self Care Home Management;Cryotherapy;Electrical Stimulation;Moist Heat;Traction;Ultrasound;DME Instruction;Iontophoresis 4mg /ml Dexamethasone;Functional mobility training;Balance training;Therapeutic activities;Therapeutic exercise;Patient/family education;Manual techniques;Passive range of motion;Dry needling;Taping;Neuromuscular re-education    PT Next Visit Plan  core in standing, hips strength, walking program? manual and consider DN    PT Home Exercise Plan  seated piriformis, hip abd, childs pose, added bent knee raise, bridge, squats    Consulted and Agree with Plan of Care  Patient       Patient will benefit from skilled therapeutic intervention in order to improve the following deficits and impairments:  Decreased activity tolerance, Decreased range of motion, Decreased strength, Decreased mobility, Decreased balance, Difficulty walking, Impaired flexibility, Pain, Impaired UE functional use, Postural dysfunction, Increased fascial restricitons, Impaired sensation, Improper body mechanics  Visit Diagnosis: Chronic bilateral low back pain with bilateral sciatica  Muscle weakness (generalized)  Pain in left hip  Pain in right hip  Unspecified lack of coordination  Cramp and spasm     Problem List Patient Active Problem List   Diagnosis Date Noted  . Hyperlipidemia 07/18/2017  . Ulnar neuropathy 04/18/2017  . GERD (gastroesophageal reflux disease) 10/19/2016  . Prediabetes 05/03/2016  . Cubital  tunnel syndrome on right 03/01/2016  .  Numbness and tingling of right arm 01/01/2016  . Chlamydia infection 01/27/2015  . Smoking 12/26/2013  . Chronic lower back pain 04/13/2013  . Sciatica 04/13/2013  . Chronic neck pain 04/13/2013  . Spondylosis of cervical spine 04/13/2013    Cigi Bega 04/30/2019, 8:35 AM  [place], [place], [address] Phone: [phone]   Fax:  [phone]  Name: Cheryl King MRN: [id number] Date of Birth: 06/14/1965   Raeford Razor, PT 04/30/19 8:35 AM Phone: [phone] Fax: [phone]

## 2019-05-01 ENCOUNTER — Other Ambulatory Visit: Payer: Self-pay | Admitting: Family Medicine

## 2019-05-01 DIAGNOSIS — F4310 Post-traumatic stress disorder, unspecified: Secondary | ICD-10-CM | POA: Diagnosis not present

## 2019-05-01 DIAGNOSIS — R0982 Postnasal drip: Secondary | ICD-10-CM

## 2019-05-01 DIAGNOSIS — F331 Major depressive disorder, recurrent, moderate: Secondary | ICD-10-CM | POA: Diagnosis not present

## 2019-05-01 MED FILL — AMITRIPTYLINE HCL 25 MG TAB: 25 | 30 days supply | Qty: 30 | Fill #0

## 2019-05-01 MED FILL — FLUTICASONE PROP 50 MCG SPR: 50 | 30 days supply | Qty: 16 | Fill #0

## 2019-05-01 MED FILL — DULoxetine HCL 60 MG CPEP: 60 | 30 days supply | Qty: 30 | Fill #0

## 2019-05-07 ENCOUNTER — Ambulatory Visit: Payer: Medicaid Other | Admitting: Physical Therapy

## 2019-05-07 ENCOUNTER — Other Ambulatory Visit: Payer: Self-pay

## 2019-05-07 ENCOUNTER — Encounter: Payer: Self-pay | Admitting: Physical Therapy

## 2019-05-07 DIAGNOSIS — G8929 Other chronic pain: Secondary | ICD-10-CM | POA: Diagnosis not present

## 2019-05-07 DIAGNOSIS — R252 Cramp and spasm: Secondary | ICD-10-CM | POA: Diagnosis not present

## 2019-05-07 DIAGNOSIS — M25552 Pain in left hip: Secondary | ICD-10-CM

## 2019-05-07 DIAGNOSIS — M5441 Lumbago with sciatica, right side: Secondary | ICD-10-CM | POA: Diagnosis not present

## 2019-05-07 DIAGNOSIS — M25551 Pain in right hip: Secondary | ICD-10-CM | POA: Diagnosis not present

## 2019-05-07 DIAGNOSIS — R279 Unspecified lack of coordination: Secondary | ICD-10-CM | POA: Diagnosis not present

## 2019-05-07 DIAGNOSIS — M6281 Muscle weakness (generalized): Secondary | ICD-10-CM

## 2019-05-07 DIAGNOSIS — M5442 Lumbago with sciatica, left side: Secondary | ICD-10-CM

## 2019-05-07 NOTE — Therapy (Signed)
[place], [place], [address] Phone: [phone]   Fax:  [phone]  Physical Therapy Treatment  Patient Details  Name: Cheryl King MRN: [id number] Date of Birth: [birth year] Referring Provider (PT): Dr.  Louanne Skye    Encounter Date: 05/07/2019  PT End of Session - 05/07/19 0835    Visit Number  10    Number of Visits  16    Date for PT Re-Evaluation  05/25/19    Authorization Type  MCD    Authorization Time Period  3/16 to 4/26    Authorization - Visit Number  5    Authorization - Number of Visits  12    PT Start Time  [phone]    PT Stop Time  0930    PT Time Calculation (min)  58 min    Activity Tolerance  Patient tolerated treatment well    Behavior During Therapy  [facility] for tasks assessed/performed       Past Medical History:  Diagnosis Date  . Acid reflux   . Allergy   . Carpal tunnel syndrome   . Depression   . Diverticulitis   . Hyperlipidemia   . Low back pain   . Neck pain   . 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 Sciatica   . Seasonal allergies     Past Surgical History:  Procedure Laterality Date  . ABDOMINAL SURGERY    . ANKLE SURGERY     lt.  Marland Kitchen PARTIAL HYSTERECTOMY    . right breast cyst removed     . URETHRAL DIVERTICULUM REPAIR     uretrral diverticultis    There were no vitals filed for this visit.  Subjective Assessment - 05/07/19 0834    Subjective  Back is 5/10.  No bursitis pain.  Pain is in low back and buttocks.    Currently in Pain?  Yes    Pain Score  5     Pain Location  Back    Pain Orientation  Lower;Right    Pain Descriptors / Indicators  Tightness;Aching    Pain Type  Chronic pain    Pain Onset  More than a month ago    Pain Frequency  Constant          [facility] Adult PT Treatment/Exercise - 05/07/19 0001      Self-Care   Self-Care  --   dry needling, muscel groups     Lumbar Exercises: Stretches   Figure 4 Stretch  3 reps;30 seconds    Figure  4 Stretch Limitations  seated, 1 rep 90 sec       Lumbar Exercises: Aerobic   Nustep  6 min , LE , level 6 no pain in incr       Lumbar Exercises: Standing   Row  Strengthening;Both;20 reps;Theraband    Theraband Level (Row)  Level 3 (Green)    Shoulder Extension  Strengthening;Both;15 reps;Theraband    Theraband Level (Shoulder Extension)  Level 3 (Green)    Other Standing Lumbar Exercises  single arm adduction with cues for core, lats    Other Standing Lumbar Exercises  standing looped glute ext blue x 10 each       Knee/Hip Exercises: Sidelying   Hip ABduction Limitations  2 x 10     Clams  2 x 10       Moist Heat Therapy   Moist Heat Location  Lumbar Spine;Hip      Manual Therapy   Soft tissue  mobilization  bilateral lumbar paraspinals, L and R piriformis              PT Education - 05/07/19 0903    Education Details  HEP, core, POC    Person(s) Educated  Patient    Methods  Explanation    Comprehension  Verbalized understanding       PT Short Term Goals - 04/13/19 [id number]      PT SHORT TERM GOAL #1   Title  Pt will be able to demonstrate proper core contraction in supine, seated and standing postures    Status  Achieved      PT SHORT TERM GOAL #2   Title  Pt will be independent in short term HEP for core, hips    Status  Achieved        PT Long Term Goals - 04/13/19 0840      PT LONG TERM GOAL #1   Title  Pt will be able to show independence with HEP and compliance for long term success    Status  On-going      PT LONG TERM GOAL #2   Title  Pt will be able to report standing, walking for 30 min with min increase in pain from baseline (errands, fitness)    Baseline  15-20 min, pain increases moderately    Status  On-going      PT LONG TERM GOAL #3   Title  Pt will be able to sit for meals 30 min without increasing back pain    Baseline  15 min    Status  On-going      PT LONG TERM GOAL #4   Title  Pt will be able to demo 5/5 hip strength bilateral  to support spine, core with standing, walking.      PT LONG TERM GOAL #5   Title  Pt will be able to lift 25 lbs with good form and no increased pain    Baseline  has good days and bad    Status  On-going            Plan - 05/07/19 0854    Clinical Impression Statement  Patient cont to have constant pain in back.  She is tolerating more activity, needs cues to activate core in standing. She would like to cont PT to finish time alottment with MCD.    PT Treatment/Interventions  ADLs/Self Care Home Management;Cryotherapy;Electrical Stimulation;Moist Heat;Traction;Ultrasound;DME Instruction;Iontophoresis 4mg /ml Dexamethasone;Functional mobility training;Balance training;Therapeutic activities;Therapeutic exercise;Patient/family education;Manual techniques;Passive range of motion;Dry needling;Taping;Neuromuscular re-education    PT Next Visit Plan  core in standing, hips strength, treadmill for warm up. walking program? manual and consider DN again- ask MCD for more time to achieve visit    PT Home Exercise Plan  seated piriformis, hip abd, childs pose, added bent knee raise, bridge, squats.  Added clam and hip adduction today    Consulted and Agree with Plan of Care  Patient       Patient will benefit from skilled therapeutic intervention in order to improve the following deficits and impairments:  Decreased activity tolerance, Decreased range of motion, Decreased strength, Decreased mobility, Decreased balance, Difficulty walking, Impaired flexibility, Pain, Impaired UE functional use, Postural dysfunction, Increased fascial restricitons, Impaired sensation, Improper body mechanics  Visit Diagnosis: Chronic bilateral low back pain with bilateral sciatica  Muscle weakness (generalized)  Pain in left hip  Pain in right hip  Cramp and spasm     Problem List Patient Active Problem List  Diagnosis Date Noted  . Hyperlipidemia 07/18/2017  . Ulnar neuropathy 04/18/2017  . GERD  (gastroesophageal reflux disease) 10/19/2016  . Prediabetes 05/03/2016  . Cubital tunnel syndrome on right 03/01/2016  . Numbness and tingling of right arm 01/01/2016  . Chlamydia infection 01/27/2015  . Smoking 12/26/2013  . Chronic lower back pain 04/13/2013  . Sciatica 04/13/2013  . Chronic neck pain 04/13/2013  . Spondylosis of cervical spine 04/13/2013    Salle Brandle 05/07/2019, 10:39 AM  [place], [place], [address] Phone: [phone]   Fax:  [phone]  Name: Cheryl King MRN: [id number] Date of Birth: 1965-11-20   Raeford Razor, PT 05/07/19 10:39 AM Phone: [phone] Fax: [phone]

## 2019-05-07 NOTE — Patient Instructions (Signed)
Access Code: [email or web address]: [email or web address]: 04/19/2021Prepared by: Anderson Malta PaaExercises  Clamshell - 1 x daily - 7 x weekly - 2 sets - 10 reps - 5 hold  Sidelying Hip Adduction - 1 x daily - 7 x weekly - 2 sets - 10 reps - 5 hold

## 2019-05-08 ENCOUNTER — Other Ambulatory Visit: Payer: Self-pay | Admitting: Specialist

## 2019-05-08 MED FILL — PREGABALIN 50 MG CAPS: 50 | 90 days supply | Qty: 90 | Fill #1

## 2019-05-08 MED FILL — CYCLOBENZAPRINE 10 MG TAB: 10 | 30 days supply | Qty: 60 | Fill #2

## 2019-05-09 MED FILL — NAPROXEN 500 MG TABLET: 500 | 30 days supply | Qty: 60 | Fill #0

## 2019-05-10 DIAGNOSIS — H16223 Keratoconjunctivitis sicca, not specified as Sjogren's, bilateral: Secondary | ICD-10-CM | POA: Diagnosis not present

## 2019-05-10 DIAGNOSIS — H40033 Anatomical narrow angle, bilateral: Secondary | ICD-10-CM | POA: Diagnosis not present

## 2019-05-14 ENCOUNTER — Other Ambulatory Visit (INDEPENDENT_AMBULATORY_CARE_PROVIDER_SITE_OTHER): Payer: Self-pay | Admitting: Specialist

## 2019-05-14 ENCOUNTER — Encounter: Payer: Self-pay | Admitting: Physical Therapy

## 2019-05-14 DIAGNOSIS — M5136 Other intervertebral disc degeneration, lumbar region: Secondary | ICD-10-CM

## 2019-05-14 DIAGNOSIS — M542 Cervicalgia: Secondary | ICD-10-CM

## 2019-05-14 MED FILL — ACETAMINOPHEN/COD #3 TABLET: 300-30 | 7 days supply | Qty: 21 | Fill #0

## 2019-05-16 MED FILL — OXYBUTYNIN 5 MG TABLET: 5 | 30 days supply | Qty: 60 | Fill #1

## 2019-05-21 DIAGNOSIS — F331 Major depressive disorder, recurrent, moderate: Secondary | ICD-10-CM | POA: Diagnosis not present

## 2019-05-21 DIAGNOSIS — F4310 Post-traumatic stress disorder, unspecified: Secondary | ICD-10-CM | POA: Diagnosis not present

## 2019-05-23 ENCOUNTER — Other Ambulatory Visit: Payer: Self-pay

## 2019-05-23 ENCOUNTER — Encounter: Payer: Self-pay | Admitting: Physical Therapy

## 2019-05-23 ENCOUNTER — Ambulatory Visit: Payer: Medicaid Other | Attending: Specialist | Admitting: Physical Therapy

## 2019-05-23 DIAGNOSIS — G8929 Other chronic pain: Secondary | ICD-10-CM | POA: Diagnosis not present

## 2019-05-23 DIAGNOSIS — M5441 Lumbago with sciatica, right side: Secondary | ICD-10-CM | POA: Insufficient documentation

## 2019-05-23 DIAGNOSIS — M6281 Muscle weakness (generalized): Secondary | ICD-10-CM | POA: Insufficient documentation

## 2019-05-23 DIAGNOSIS — M25552 Pain in left hip: Secondary | ICD-10-CM | POA: Diagnosis not present

## 2019-05-23 DIAGNOSIS — M5442 Lumbago with sciatica, left side: Secondary | ICD-10-CM | POA: Diagnosis not present

## 2019-05-23 DIAGNOSIS — R252 Cramp and spasm: Secondary | ICD-10-CM | POA: Diagnosis not present

## 2019-05-23 DIAGNOSIS — R279 Unspecified lack of coordination: Secondary | ICD-10-CM | POA: Diagnosis not present

## 2019-05-23 DIAGNOSIS — M25551 Pain in right hip: Secondary | ICD-10-CM | POA: Diagnosis not present

## 2019-05-23 NOTE — Therapy (Signed)
[place], [place], [address] Phone: [phone]   Fax:  [phone]  Physical Therapy Treatment/Discharge  Patient Details  Name: Cheryl King MRN: [id number] Date of Birth: Aug 30, 1965 Referring Provider (PT): Dr.  Louanne Skye    Encounter Date: 05/23/2019  PT End of Session - 05/23/19 0854    Visit Number  11    Number of Visits  16    Date for PT Re-Evaluation  05/25/19    Authorization Type  MCD    Authorization - Visit Number  --    Authorization - Number of Visits  12    PT Start Time  0830    PT Stop Time  0908    PT Time Calculation (min)  38 min       Past Medical History:  Diagnosis Date  . Acid reflux   . Allergy   . Carpal tunnel syndrome   . Depression   . Diverticulitis   . Hyperlipidemia   . Low back pain   . Neck pain   . Sacroiliac inflammation ([place])   . Sciatica   . Seasonal allergies     Past Surgical History:  Procedure Laterality Date  . ABDOMINAL SURGERY    . ANKLE SURGERY     lt.  Marland Kitchen PARTIAL HYSTERECTOMY    . right breast cyst removed     . URETHRAL DIVERTICULUM REPAIR     uretrral diverticultis    There were no vitals filed for this visit.  Subjective Assessment - 05/23/19 0836    Subjective  About a 4/10.  My trip is next weekend.  Been inside alot due to allergies.    Currently in Pain?  Yes    Pain Score  4     Pain Location  Back    Pain Orientation  Lower;Right;Left    Pain Descriptors / Indicators  Aching;Tightness    Pain Type  Chronic pain    Pain Onset  More than a month ago    Pain Frequency  Constant    Multiple Pain Sites  No         [facility] PT Assessment - 05/23/19 0001      AROM   Lumbar Flexion  touches toes    Lumbar Extension  25% min dscomfort     Lumbar - Right Side Bend  WFL    Lumbar - Left Side Bend  pain    Lumbar - Right Rotation  WFL    Lumbar - Left Rotation  [facility]       Strength   Overall Strength Comments  adduction  3+/5     Right Hip Flexion  4/5    Right Hip ABduction  4/5    Left Hip Flexion  4/5    Left Hip ABduction  4/5    Right Knee Flexion  5/5    Right Knee Extension  5/5    Left Knee Flexion  5/5    Left Knee Extension  5/5       [facility] Adult PT Treatment/Exercise - 05/23/19 0001      Lumbar Exercises: Stretches   Double Knee to Chest Stretch  3 reps;30 seconds    Quadruped Mid Back Stretch  1 rep    Figure 4 Stretch  3 reps;30 seconds    Figure 4 Stretch Limitations  seated, 1 rep 90 sec       Lumbar Exercises: Aerobic   Nustep  7 min , L 6 UE  and LE       Lumbar Exercises: Standing   Other Standing Lumbar Exercises  25# lift KB from 6 inch step -good form, no pain       Lumbar Exercises: Supine   Bent Knee Raise  20 reps    Bent Knee Raise Limitations  ball under pelvis       Knee/Hip Exercises: Supine   Bridges  20 reps      Knee/Hip Exercises: Sidelying   Hip ABduction Limitations  2 x 10     Clams  2 x 10                PT Short Term Goals - 04/13/19 7416      PT SHORT TERM GOAL #1   Title  Pt will be able to demonstrate proper core contraction in supine, seated and standing postures    Status  Achieved      PT SHORT TERM GOAL #2   Title  Pt will be independent in short term HEP for core, hips    Status  Achieved        PT Long Term Goals - 05/23/19 0905      PT LONG TERM GOAL #1   Title  Pt will be able to show independence with HEP and compliance for long term success    Time  8    Period  Weeks    Status  Achieved      PT LONG TERM GOAL #2   Title  Pt will be able to report standing, walking for 30 min with min increase in pain from baseline (errands, fitness)    Baseline  15-20 min, pain increases moderately    Time  8    Period  Weeks    Status  Partially Met      PT LONG TERM GOAL #3   Title  Pt will be able to sit for meals 30 min without increasing back pain    Baseline  some days are better and having better days    Time  8     Period  Weeks    Status  Partially Met      PT LONG TERM GOAL #4   Title  Pt will be able to demo 5/5 hip strength bilateral to support spine, core with standing, walking.    Baseline  4/5    Time  8    Period  Weeks    Status  Not Met      PT LONG TERM GOAL #5   Title  Pt will be able to lift 25 lbs with good form and no increased pain    Baseline  met in clinic today with 25# KB and good form    Time  8    Period  Weeks    Status  Achieved            Plan - 05/23/19 0926    Clinical Impression Statement  Pt reports sitting tolerance is about the same but she rpeorts some days are a little better. She is utilizing seated stretchces to assist with tolerance. Stanidng/walking for errands still limited ot 20 minutes. Hip strength improved to 4/5. She was able to squat and lift 25# Kettle bell in clinic from 6 inch step today without increased pain. LTG# 1, 5 met, LTG#2,3 partially met. She is agreeable to discharge to HEP and continue working on core and hip strength.    Comorbidities  chronic pain, L  ankle surgery, neck pain, pelvic floor weakness    Examination-Activity Limitations  Stand;Sleep;Squat;Locomotion Level;Carry;Bend    Examination-Participation Restrictions  Interpersonal Relationship;Yard Work;Community Activity;Shop;Meal Prep;Cleaning;Laundry    Stability/Clinical Decision Making  Evolving/Moderate complexity    Rehab Potential  Good    PT Frequency  1x / week    PT Duration  6 weeks    PT Treatment/Interventions  ADLs/Self Care Home Management;Cryotherapy;Electrical Stimulation;Moist Heat;Traction;Ultrasound;DME Instruction;Iontophoresis 14m/ml Dexamethasone;Functional mobility training;Balance training;Therapeutic activities;Therapeutic exercise;Patient/family education;Manual techniques;Passive range of motion;Dry needling;Taping;Neuromuscular re-education    PT Next Visit Plan  discharge today    PT Home Exercise Plan  seated piriformis, hip abd, childs pose,  added bent knee raise, bridge, squats.  Added clam and hip adduction today    Consulted and Agree with Plan of Care  Patient       Patient will benefit from skilled therapeutic intervention in order to improve the following deficits and impairments:  Decreased activity tolerance, Decreased range of motion, Decreased strength, Decreased mobility, Decreased balance, Difficulty walking, Impaired flexibility, Pain, Impaired UE functional use, Postural dysfunction, Increased fascial restricitons, Impaired sensation, Improper body mechanics  Visit Diagnosis: Chronic bilateral low back pain with bilateral sciatica  Muscle weakness (generalized)  Pain in left hip  Pain in right hip  Cramp and spasm  Unspecified lack of coordination     Problem List Patient Active Problem List   Diagnosis Date Noted  . Hyperlipidemia 07/18/2017  . Ulnar neuropathy 04/18/2017  . GERD (gastroesophageal reflux disease) 10/19/2016  . Prediabetes 05/03/2016  . Cubital tunnel syndrome on right 03/01/2016  . Numbness and tingling of right arm 01/01/2016  . Chlamydia infection 01/27/2015  . Smoking 12/26/2013  . Chronic lower back pain 04/13/2013  . Sciatica 04/13/2013  . Chronic neck pain 04/13/2013  . Spondylosis of cervical spine 04/13/2013    DDorene Ar PTA 05/23/2019, 9:31 AM  [facility] [address]Phone: [phone]  Fax:  [phone] Name: TSangeeta YouseMRN: [id number] of Birth: 506/01/1965 PHYSICAL THERAPY DISCHARGE SUMMARY  Visits from Start of Care: 11  Current functional level related to goals / functional outcomes: See above. Pain interferes with activity but min to mod.  She can manage her pain with modification of activity.  Knows proper exercise form and body mechanics.     Remaining deficits: Pain , hip and core weakness    Education / Equipment: HEP, posture, lifting   Plan: Patient agrees to discharge.  Patient goals were met. Patient is being discharged due to being pleased with the current functional level.  ?????    JRaeford Razor PT 05/23/19 11:44 AM Phone: [phone]: [phone]

## 2019-06-03 DIAGNOSIS — H5213 Myopia, bilateral: Secondary | ICD-10-CM | POA: Diagnosis not present

## 2019-06-11 ENCOUNTER — Other Ambulatory Visit (INDEPENDENT_AMBULATORY_CARE_PROVIDER_SITE_OTHER): Payer: Self-pay | Admitting: Specialist

## 2019-06-11 DIAGNOSIS — M5136 Other intervertebral disc degeneration, lumbar region: Secondary | ICD-10-CM

## 2019-06-11 DIAGNOSIS — F331 Major depressive disorder, recurrent, moderate: Secondary | ICD-10-CM | POA: Diagnosis not present

## 2019-06-11 DIAGNOSIS — M542 Cervicalgia: Secondary | ICD-10-CM

## 2019-06-11 MED FILL — NAPROXEN 500 MG TABLET: 500 | 30 days supply | Qty: 60 | Fill #1

## 2019-06-11 MED FILL — CYCLOBENZAPRINE 10 MG TAB: 10 | 30 days supply | Qty: 60 | Fill #0

## 2019-06-11 MED FILL — ACETAMINOPHEN/COD #3 TABLET: 300-30 | 7 days supply | Qty: 21 | Fill #0

## 2019-06-11 MED FILL — DULoxetine HCL 60 MG CPEP: 60 | 30 days supply | Qty: 30 | Fill #1

## 2019-06-12 MED FILL — FLUTICASONE PROP 50 MCG SPR: 50 | 30 days supply | Qty: 16 | Fill #1

## 2019-06-25 DIAGNOSIS — F331 Major depressive disorder, recurrent, moderate: Secondary | ICD-10-CM | POA: Diagnosis not present

## 2019-06-25 DIAGNOSIS — F4310 Post-traumatic stress disorder, unspecified: Secondary | ICD-10-CM | POA: Diagnosis not present

## 2019-07-04 ENCOUNTER — Encounter: Payer: Self-pay | Admitting: Family Medicine

## 2019-07-04 ENCOUNTER — Ambulatory Visit: Payer: Medicaid Other | Attending: Family Medicine | Admitting: Family Medicine

## 2019-07-04 ENCOUNTER — Other Ambulatory Visit: Payer: Self-pay | Admitting: Family Medicine

## 2019-07-04 ENCOUNTER — Other Ambulatory Visit: Payer: Self-pay

## 2019-07-04 VITALS — BP 96/62 | HR 71 | Ht 67.0 in | Wt 149.0 lb

## 2019-07-04 DIAGNOSIS — G8929 Other chronic pain: Secondary | ICD-10-CM | POA: Insufficient documentation

## 2019-07-04 DIAGNOSIS — Z791 Long term (current) use of non-steroidal anti-inflammatories (NSAID): Secondary | ICD-10-CM | POA: Insufficient documentation

## 2019-07-04 DIAGNOSIS — G44229 Chronic tension-type headache, not intractable: Secondary | ICD-10-CM | POA: Insufficient documentation

## 2019-07-04 DIAGNOSIS — Z720 Tobacco use: Secondary | ICD-10-CM

## 2019-07-04 DIAGNOSIS — F17210 Nicotine dependence, cigarettes, uncomplicated: Secondary | ICD-10-CM | POA: Diagnosis not present

## 2019-07-04 DIAGNOSIS — M5442 Lumbago with sciatica, left side: Secondary | ICD-10-CM | POA: Insufficient documentation

## 2019-07-04 DIAGNOSIS — R0609 Other forms of dyspnea: Secondary | ICD-10-CM | POA: Diagnosis not present

## 2019-07-04 DIAGNOSIS — R32 Unspecified urinary incontinence: Secondary | ICD-10-CM | POA: Diagnosis not present

## 2019-07-04 DIAGNOSIS — R7303 Prediabetes: Secondary | ICD-10-CM | POA: Diagnosis not present

## 2019-07-04 DIAGNOSIS — E785 Hyperlipidemia, unspecified: Secondary | ICD-10-CM | POA: Insufficient documentation

## 2019-07-04 DIAGNOSIS — Z8249 Family history of ischemic heart disease and other diseases of the circulatory system: Secondary | ICD-10-CM | POA: Diagnosis not present

## 2019-07-04 DIAGNOSIS — K219 Gastro-esophageal reflux disease without esophagitis: Secondary | ICD-10-CM | POA: Insufficient documentation

## 2019-07-04 DIAGNOSIS — Z880 Allergy status to penicillin: Secondary | ICD-10-CM | POA: Diagnosis not present

## 2019-07-04 DIAGNOSIS — E7800 Pure hypercholesterolemia, unspecified: Secondary | ICD-10-CM | POA: Diagnosis not present

## 2019-07-04 DIAGNOSIS — F17200 Nicotine dependence, unspecified, uncomplicated: Secondary | ICD-10-CM

## 2019-07-04 DIAGNOSIS — N39498 Other specified urinary incontinence: Secondary | ICD-10-CM

## 2019-07-04 DIAGNOSIS — M5441 Lumbago with sciatica, right side: Secondary | ICD-10-CM | POA: Diagnosis not present

## 2019-07-04 DIAGNOSIS — Z79899 Other long term (current) drug therapy: Secondary | ICD-10-CM | POA: Diagnosis not present

## 2019-07-04 DIAGNOSIS — Z1231 Encounter for screening mammogram for malignant neoplasm of breast: Secondary | ICD-10-CM

## 2019-07-04 DIAGNOSIS — Z885 Allergy status to narcotic agent status: Secondary | ICD-10-CM | POA: Diagnosis not present

## 2019-07-04 LAB — POCT GLYCOSYLATED HEMOGLOBIN (HGB A1C): HbA1c, POC (prediabetic range): 5.6 % — AB (ref 5.7–6.4)

## 2019-07-04 MED ORDER — ALBUTEROL SULFATE HFA 108 (90 BASE) MCG/ACT IN AERS: 2.0000 | INHALATION_SPRAY | Freq: Four times a day (QID) | RESPIRATORY_TRACT | 1 refills | Status: DC | PRN

## 2019-07-04 MED ORDER — DULOXETINE HCL 60 MG PO CPEP: 60.0000 mg | ORAL_CAPSULE | Freq: Every day | ORAL | 6 refills | Status: DC

## 2019-07-04 MED ORDER — AMITRIPTYLINE HCL 25 MG PO TABS: 25.0000 mg | ORAL_TABLET | Freq: Every day | ORAL | 3 refills | Status: DC

## 2019-07-04 MED ORDER — ATORVASTATIN CALCIUM 20 MG PO TABS: 20.0000 mg | ORAL_TABLET | Freq: Every day | ORAL | 1 refills | Status: DC

## 2019-07-04 MED ORDER — NICOTINE 14 MG/24HR TD PT24: 14.0000 mg | MEDICATED_PATCH | Freq: Every day | TRANSDERMAL | 1 refills | Status: DC

## 2019-07-04 MED ORDER — OXYBUTYNIN CHLORIDE 5 MG PO TABS: 5.0000 mg | ORAL_TABLET | Freq: Two times a day (BID) | ORAL | 6 refills | Status: DC

## 2019-07-04 MED FILL — NICOTINE 14 MG/24HR PATCH: 14 | 28 days supply | Qty: 28 | Fill #0

## 2019-07-04 MED FILL — PROAIR HFA 90 MCG INHALER: 108 (90 BAS | 25 days supply | Qty: 9 | Fill #0

## 2019-07-04 NOTE — Progress Notes (Signed)
Subjective:  Patient ID: Cheryl King, female    DOB: [birth year]  Age: 54 y.o. MRN: [id number]  CC:  Chief Complaint  Patient presents with  . Shortness of Breath     HPI Cheryl King is 54 year old female with Medical history significant for Prediabetes (diet controlled A1c 5.6) chronic low back pain with sciatica, GERD, hyperlipidemia, tobacco abuse here for a follow-up visit. She has been undergoing physical therapy and this has been beneficial with regards to her pelvic pain and low back pain.  Low back pain is described as a 6/10 and she is doing well on Cymbalta, Lyrica and tramadol. For her urinary incontinence she has been on oxybutynin with improvement in her symptoms  She gets dyspneic when she walks up her driveway intermittently and sometimes her cleaning agents cause her to be dyspneic. Denies cough or wheezing and has no chest pain She smokes a little over half a pack of cigarette daily and has smoked for the last 40 years.  She would like a refill of nicotine patches. Past Medical History:  Diagnosis Date  . Acid reflux   . Allergy   . Carpal tunnel syndrome   . Depression   . Diverticulitis   . Hyperlipidemia   . Low back pain   . Neck pain   . Sacroiliac inflammation ([place])   . Sciatica   . Seasonal allergies     Past Surgical History:  Procedure Laterality Date  . ABDOMINAL SURGERY    . ANKLE SURGERY     lt.  Marland Kitchen PARTIAL HYSTERECTOMY    . right breast cyst removed     . URETHRAL DIVERTICULUM REPAIR     uretrral diverticultis    Family History  Problem Relation Age of Onset  . Healthy Mother   . Other Father        Unsure of medical history  . Asthma Maternal Aunt   . Diabetes Maternal Aunt   . Cancer Maternal Aunt   . Breast cancer Maternal Aunt   . Hypertension Maternal Grandmother   . Colon cancer Neg Hx   . Pancreatic cancer Neg Hx   . Rectal cancer Neg Hx   . Stomach cancer Neg Hx   . Colon polyps Neg Hx   . Esophageal  cancer Neg Hx     Allergies  Allergen Reactions  . Percocet [Oxycodone-Acetaminophen] Nausea Only  . Vicodin [Hydrocodone-Acetaminophen] Nausea Only  . Penicillins Rash    Outpatient Medications Prior to Visit  Medication Sig Dispense Refill  . amitriptyline (ELAVIL) 25 MG tablet Take 1 tablet (25 mg total) by mouth at bedtime. 30 tablet 3  . atorvastatin (LIPITOR) 20 MG tablet Take 1 tablet (20 mg total) by mouth daily. 90 tablet 1  . cetirizine (ZYRTEC) 10 MG tablet Take 1 tablet (10 mg total) by mouth daily. 90 tablet 1  . cyclobenzaprine (FLEXERIL) 10 MG tablet Take 1 tablet (10 mg total) by mouth 2 (two) times daily as needed for muscle spasms. 60 tablet 2  . diclofenac sodium (VOLTAREN) 1 % GEL Apply 2 g topically 4 (four) times daily. 5 Tube 2  . DULoxetine (CYMBALTA) 60 MG capsule Take 1 capsule (60 mg total) by mouth daily. 30 capsule 6  . fluticasone (FLONASE) 50 MCG/ACT nasal spray PLACE 2 SPRAYS INTO BOTH NOSTRILS DAILY. 16 g 2  . Lactobacillus CAPS Take 1 capsule by mouth daily.    . Multiple Vitamin (MULTIVITAMIN WITH MINERALS) TABS tablet Take 1 tablet by mouth daily.    Marland Kitchen  naproxen (NAPROSYN) 500 MG tablet TAKE 1 TABLET BY MOUTH TWO TIMES DAILY WITH MEALS 60 tablet 3  . ondansetron (ZOFRAN) 4 MG tablet TAKE 1 TABLET BY MOUTH EVERY 8 HOURS AS NEEDED FOR NAUSEA OR VOMITING. 60 tablet 1  . oxybutynin (DITROPAN) 5 MG tablet Take 1 tablet (5 mg total) by mouth 2 (two) times daily. 60 tablet 6  . pregabalin (LYRICA) 50 MG capsule Take 1 capsule (50 mg total) by mouth at bedtime as needed. 30 capsule 6  . RESTASIS 0.05 % ophthalmic emulsion   3  . traMADol (ULTRAM) 50 MG tablet Take by mouth every 6 (six) hours as needed.    . triamcinolone cream (KENALOG) 0.1 % Apply 1 application topically 2 (two) times daily. 45 g 1  . nicotine (NICODERM CQ) 14 mg/24hr patch Place 1 patch (14 mg total) onto the skin daily. For 4 weeks then 32m/24hr daily for  4 weeks (Patient not taking:  Reported on 04/04/2019) 28 patch 1   Facility-Administered Medications Prior to Visit  Medication Dose Route Frequency Provider Last Rate Last Admin  . 0.9 %  sodium chloride infusion  500 mL Intravenous Once DNelida MeuseIII, MD         ROS Review of Systems  Constitutional: Negative for activity change, appetite change and fatigue.  HENT: Negative for congestion, sinus pressure and sore throat.   Eyes: Negative for visual disturbance.  Respiratory: Positive for shortness of breath. Negative for cough, chest tightness and wheezing.   Cardiovascular: Negative for chest pain and palpitations.  Gastrointestinal: Negative for abdominal distention, abdominal pain and constipation.  Endocrine: Negative for polydipsia.  Genitourinary: Negative for dysuria and frequency.  Musculoskeletal: Negative for arthralgias and back pain.  Skin: Negative for rash.  Neurological: Negative for tremors, light-headedness and numbness.  Hematological: Does not bruise/bleed easily.  Psychiatric/Behavioral: Negative for agitation and behavioral problems.    Objective:  BP 96/62   Pulse 71   Ht 5' 7" (1.702 m)   Wt 149 lb (67.6 kg)   SpO2 96%   BMI 23.34 kg/m   BP/Weight 07/04/2019 03/14/2019 19/89/2119 Systolic BP 96 [id number] Diastolic BP 62 70 69  Wt. (Lbs) 149 140 140  BMI 23.34 21.93 21.93      Physical Exam Constitutional:      Appearance: She is well-developed.  Neck:     Vascular: No JVD.  Cardiovascular:     Rate and Rhythm: Normal rate.     Heart sounds: Normal heart sounds. No murmur heard.   Pulmonary:     Effort: Pulmonary effort is normal.     Breath sounds: Normal breath sounds. No wheezing or rales.  Chest:     Chest wall: No tenderness.  Abdominal:     General: Bowel sounds are normal. There is no distension.     Palpations: Abdomen is soft. There is no mass.     Tenderness: There is no abdominal tenderness.  Musculoskeletal:        General: No tenderness. Normal  range of motion.     Right lower leg: No edema.     Left lower leg: No edema.     Comments: Negative straight leg raise bilaterally  Neurological:     Mental Status: She is alert and oriented to person, place, and time.  Psychiatric:        Mood and Affect: Mood normal.     CMP Latest Ref Rng & Units 09/20/2018 03/09/2018 04/18/2017  Glucose  65 - 99 mg/dL 92 86 86  BUN 6 - 24 mg/dL _0 Creatinine 0.57 - 1.00 mg/dL 0.72 0.69 0.67  Sodium 134 - 144 mmol/L 141 142 140  Potassium 3.5 - 5.2 mmol/L 4.9 4.5 4.9  Chloride 96 - 106 mmol/L 102 102 102  CO2 20 - 29 mmol/L _1 Calcium 8.7 - 10.2 mg/dL 9.7 9.7 9.9  Total Protein 6.0 - 8.5 g/dL 6.7 7.1 7.0  Total Bilirubin 0.0 - 1.2 mg/dL 0.3 0.4 0.3  Alkaline Phos 39 - 117 IU/L 113 118(H) 111  AST 0 - 40 IU/L _2 ALT 0 - 32 IU/L _3 Lipid Panel     Component Value Date/Time   CHOL 202 (H) 09/20/2018 1011   TRIG 83 09/20/2018 1011   HDL 67 09/20/2018 1011   CHOLHDL 3.0 09/20/2018 1011   CHOLHDL 3.2 12/26/2013 1319   VLDL 18 12/26/2013 1319   LDLCALC 120 (H) 09/20/2018 1011    CBC    Component Value Date/Time   WBC 8.6 04/18/2017 1003   WBC 9.3 04/13/2013 1152   RBC 4.74 04/18/2017 1003   RBC 4.94 04/13/2013 1152   HGB 14.9 04/18/2017 1003   HCT 44.7 04/18/2017 1003   PLT 236 04/18/2017 1003   MCV 94 04/18/2017 1003   MCH 31.4 04/18/2017 1003   MCH 32.2 04/13/2013 1152   MCHC 33.3 04/18/2017 1003   MCHC 34.6 04/13/2013 1152   RDW 14.5 04/18/2017 1003   LYMPHSABS 2.6 04/18/2017 1003   MONOABS 0.9 04/13/2013 1152   EOSABS 0.1 04/18/2017 1003   BASOSABS 0.0 04/18/2017 1003    Lab Results  Component Value Date   HGBA1C 5.6 (A) 07/04/2019    The 10-year ASCVD risk score Mikey Bussing DC Jr., et al., 2013) is: 2.3%   Values used to calculate the score:     Age: 33 years     Sex: Female     Is Non-Hispanic African American: Yes     Diabetic: No     Tobacco smoker: Yes     Systolic Blood Pressure: 96  mmHg     Is BP treated: No     HDL Cholesterol: 67 mg/dL     Total Cholesterol: 202 mg/dL  Assessment & Plan:   1. Prediabetes Controlled with A1c of 5.6 Continue lifestyle modifications - POCT glycosylated hemoglobin (Hb A1C)  2. Chronic tension-type headache, not intractable Stable - amitriptyline (ELAVIL) 25 MG tablet; Take 1 tablet (25 mg total) by mouth at bedtime.  Dispense: 30 tablet; Refill: 3  3. Pure hypercholesterolemia Controlled - Lipid panel - CMP14+EGFR - atorvastatin (LIPITOR) 20 MG tablet; Take 1 tablet (20 mg total) by mouth daily.  Dispense: 90 tablet; Refill: 1  4. Chronic midline low back pain with bilateral sciatica Stable Continue Cymbalta and Lyrica - DULoxetine (CYMBALTA) 60 MG capsule; Take 1 capsule (60 mg total) by mouth daily.  Dispense: 30 capsule; Refill: 6  5. Other urinary incontinence Improved with pelvic floor rehab - oxybutynin (DITROPAN) 5 MG tablet; Take 1 tablet (5 mg total) by mouth 2 (two) times daily.  Dispense: 60 tablet; Refill: 6  6. Tobacco abuse Spent 3 minutes counseling on smoking cessation and she is willing to work on cessation - CT CHEST LUNG CA SCREEN LOW DOSE W/O CM; Future  7. Encounter for screening mammogram for malignant neoplasm of breast - MM 3D SCREEN BREAST BILATERAL; Future  8. Other form of  dyspnea - albuterol (VENTOLIN HFA) 108 (90 Base) MCG/ACT inhaler; Inhale 2 puffs into the lungs every 6 (six) hours as needed for wheezing or shortness of breath.  Dispense: 18 g; Refill: 1  9. Smoking - nicotine (NICODERM CQ) 14 mg/24hr patch; Place 1 patch (14 mg total) onto the skin daily. For 4 weeks then 18m/24hr daily for  4 weeks  Dispense: 28 patch; Refill: 1    ECharlott Rakes MD, FAAFP. [facility] [place]  07/04/2019, 9:38 AM

## 2019-07-04 NOTE — Progress Notes (Signed)
Wants to get cholesterol checked.  States that he get SOB at times.

## 2019-07-05 LAB — LIPID PANEL
Chol/HDL Ratio: 3 ratio (ref 0.0–4.4)
Cholesterol, Total: 192 mg/dL (ref 100–199)
HDL: 64 mg/dL (ref >39)
LDL Chol Calc (NIH): 116 mg/dL — ABNORMAL HIGH (ref 0–99)
Triglycerides: 65 mg/dL (ref 0–149)
VLDL Cholesterol Cal: 12 mg/dL (ref 5–40)

## 2019-07-05 LAB — CMP14+EGFR
ALT: 12 IU/L (ref 0–32)
AST: 22 IU/L (ref 0–40)
Albumin/Globulin Ratio: 1.6 (ref 1.2–2.2)
Albumin: 4.5 g/dL (ref 3.8–4.9)
Alkaline Phosphatase: 108 IU/L (ref 48–121)
BUN/Creatinine Ratio: 28 — ABNORMAL HIGH (ref 9–23)
BUN: 23 mg/dL (ref 6–24)
Bilirubin Total: 0.3 mg/dL (ref 0.0–1.2)
CO2: 26 mmol/L (ref 20–29)
Calcium: 9.8 mg/dL (ref 8.7–10.2)
Chloride: 101 mmol/L (ref 96–106)
Creatinine, Ser: 0.82 mg/dL (ref 0.57–1.00)
GFR calc Af Amer: 93 mL/min/{1.73_m2} (ref >59)
GFR calc non Af Amer: 80 mL/min/{1.73_m2} (ref >59)
Globulin, Total: 2.8 g/dL (ref 1.5–4.5)
Glucose: 86 mg/dL (ref 65–99)
Potassium: 4.9 mmol/L (ref 3.5–5.2)
Sodium: 139 mmol/L (ref 134–144)
Total Protein: 7.3 g/dL (ref 6.0–8.5)

## 2019-07-07 DIAGNOSIS — H5203 Hypermetropia, bilateral: Secondary | ICD-10-CM | POA: Diagnosis not present

## 2019-07-07 DIAGNOSIS — H1013 Acute atopic conjunctivitis, bilateral: Secondary | ICD-10-CM | POA: Diagnosis not present

## 2019-07-09 DIAGNOSIS — F331 Major depressive disorder, recurrent, moderate: Secondary | ICD-10-CM | POA: Diagnosis not present

## 2019-07-09 DIAGNOSIS — F4310 Post-traumatic stress disorder, unspecified: Secondary | ICD-10-CM | POA: Diagnosis not present

## 2019-07-10 MED FILL — DULoxetine HCL 60 MG CPEP: 60 | 30 days supply | Qty: 30 | Fill #2

## 2019-07-10 MED FILL — NAPROXEN 500 MG TABLET: 500 | 30 days supply | Qty: 60 | Fill #2

## 2019-07-12 ENCOUNTER — Ambulatory Visit (HOSPITAL_COMMUNITY)
Admission: RE | Admit: 2019-07-12 | Discharge: 2019-07-12 | Disposition: A | Discharge status: Home / Self Care | Payer: Medicaid Other | Source: Ambulatory Visit | Attending: Family Medicine | Admitting: Family Medicine

## 2019-07-12 ENCOUNTER — Other Ambulatory Visit: Payer: Self-pay

## 2019-07-12 DIAGNOSIS — Z720 Tobacco use: Secondary | ICD-10-CM | POA: Diagnosis not present

## 2019-07-20 ENCOUNTER — Telehealth: Payer: Self-pay | Admitting: Family Medicine

## 2019-07-20 NOTE — Telephone Encounter (Signed)
Results have been given to pt

## 2019-07-20 NOTE — Telephone Encounter (Signed)
Patient calling to receive CT scan results from 07/12/2019.

## 2019-07-20 NOTE — Telephone Encounter (Signed)
Pt calling for lab results / please advise

## 2019-07-24 ENCOUNTER — Other Ambulatory Visit (INDEPENDENT_AMBULATORY_CARE_PROVIDER_SITE_OTHER): Payer: Self-pay | Admitting: Specialist

## 2019-07-24 DIAGNOSIS — M542 Cervicalgia: Secondary | ICD-10-CM

## 2019-07-24 DIAGNOSIS — M5136 Other intervertebral disc degeneration, lumbar region: Secondary | ICD-10-CM

## 2019-07-24 MED FILL — ACETAMINOPHEN/COD #3 TABLET: 300-30 | 5 days supply | Qty: 15 | Fill #0

## 2019-07-24 MED FILL — OXYBUTYNIN 5 MG TABLET: 5 | 30 days supply | Qty: 60 | Fill #3

## 2019-07-24 MED FILL — CYCLOBENZAPRINE 10 MG TAB: 10 | 30 days supply | Qty: 60 | Fill #1

## 2019-07-24 MED FILL — AMITRIPTYLINE HCL 25 MG TAB: 25 | 30 days supply | Qty: 30 | Fill #3

## 2019-07-25 DIAGNOSIS — F331 Major depressive disorder, recurrent, moderate: Secondary | ICD-10-CM | POA: Diagnosis not present

## 2019-07-25 DIAGNOSIS — F4310 Post-traumatic stress disorder, unspecified: Secondary | ICD-10-CM | POA: Diagnosis not present

## 2019-07-31 MED FILL — ATORVASTATIN CALCIUM 20 MG: 20 | 30 days supply | Qty: 30 | Fill #1

## 2019-08-02 ENCOUNTER — Ambulatory Visit: Payer: Medicaid Other

## 2019-08-07 MED FILL — PREGABALIN 50 MG CAPS: 50 | 60 days supply | Qty: 60 | Fill #2

## 2019-08-08 ENCOUNTER — Other Ambulatory Visit: Payer: Self-pay | Admitting: Family Medicine

## 2019-08-08 DIAGNOSIS — F4310 Post-traumatic stress disorder, unspecified: Secondary | ICD-10-CM | POA: Diagnosis not present

## 2019-08-08 DIAGNOSIS — F331 Major depressive disorder, recurrent, moderate: Secondary | ICD-10-CM | POA: Diagnosis not present

## 2019-08-08 NOTE — Telephone Encounter (Signed)
Requested medication (s) are due for refill today:   Provider to decide  Requested medication (s) are on the active medication list:   Yes  Future visit scheduled:   No   Last ordered: Unknown  Returned because this is a non delegated refill plus it was filled by a historical provider 2 yrs ago.   Requested Prescriptions  Pending Prescriptions Disp Refills   traMADol (ULTRAM) 50 MG tablet [Pharmacy Med Name: traMADol HCL 50 MG TABS 50 Tablet] 90 tablet 1    Sig: TAKE 1 TABLET BY MOUTH EVERY 8 HOURS AS NEEDED      Not Delegated - Analgesics:  Opioid Agonists Failed - 08/08/2019  1:04 PM      Failed - This refill cannot be delegated      Failed - Urine Drug Screen completed in last 360 days.      Passed - Valid encounter within last 6 months    Recent Outpatient Visits           1 month ago Prediabetes   [place], Charlane Ferretti, MD   4 months ago Tobacco abuse   [place], Enobong, MD   7 months ago Tension headache   [place], Enobong, MD   10 months ago Tension headache   [place], MD   1 year ago Prediabetes   [place], Enobong, MD       Future Appointments             In 2 weeks Louanne Skye, Daleen Bo, MD [place]

## 2019-08-13 MED FILL — DULoxetine HCL 60 MG CPEP: 60 | 30 days supply | Qty: 30 | Fill #3

## 2019-08-13 MED FILL — NAPROXEN 500 MG TABLET: 500 | 30 days supply | Qty: 60 | Fill #3

## 2019-08-20 ENCOUNTER — Encounter: Payer: Self-pay | Admitting: Family Medicine

## 2019-08-22 ENCOUNTER — Ambulatory Visit (INDEPENDENT_AMBULATORY_CARE_PROVIDER_SITE_OTHER): Payer: Medicaid Other | Admitting: Specialist

## 2019-08-22 ENCOUNTER — Encounter: Payer: Self-pay | Admitting: Specialist

## 2019-08-22 ENCOUNTER — Other Ambulatory Visit: Payer: Self-pay

## 2019-08-22 VITALS — BP 112/72 | HR 62 | Ht 67.0 in | Wt 149.0 lb

## 2019-08-22 DIAGNOSIS — M7062 Trochanteric bursitis, left hip: Secondary | ICD-10-CM

## 2019-08-22 DIAGNOSIS — M5136 Other intervertebral disc degeneration, lumbar region: Secondary | ICD-10-CM

## 2019-08-22 DIAGNOSIS — M7061 Trochanteric bursitis, right hip: Secondary | ICD-10-CM | POA: Diagnosis not present

## 2019-08-22 DIAGNOSIS — M5416 Radiculopathy, lumbar region: Secondary | ICD-10-CM | POA: Diagnosis not present

## 2019-08-22 DIAGNOSIS — F331 Major depressive disorder, recurrent, moderate: Secondary | ICD-10-CM | POA: Diagnosis not present

## 2019-08-22 DIAGNOSIS — F4310 Post-traumatic stress disorder, unspecified: Secondary | ICD-10-CM | POA: Diagnosis not present

## 2019-08-22 MED ORDER — BUPIVACAINE HCL 0.25 % IJ SOLN
4.0000 mL | INTRAMUSCULAR | Status: AC | PRN
  Administered 2019-08-22: 4 mL via INTRA_ARTICULAR

## 2019-08-22 MED ORDER — METHYLPREDNISOLONE ACETATE 40 MG/ML IJ SUSP
40.0000 mg | INTRAMUSCULAR | Status: AC | PRN
  Administered 2019-08-22: 40 mg via INTRA_ARTICULAR

## 2019-08-22 NOTE — Patient Instructions (Signed)
Avoid frequent bending and stooping  No lifting greater than 10 lbs. May use ice or moist heat for pain. Weight loss is of benefit. Best medication for lumbar disc disease is arthritis medications like motrin, celebrex and naprosyn. Exercise is important to improve your indurance and does allow people to function better inspite of back pain.  Stretching exercises for the ITB that where given to you by PT. Voltaren gel locally.

## 2019-08-22 NOTE — Progress Notes (Signed)
Office Visit Note   Patient: Cheryl King           Date of Birth: [birth year]           MRN: [id number] Visit Date: 08/22/2019              Requested by: Charlott Rakes, MD [place],  [place] [id number] PCP: Charlott Rakes, MD   Assessment & Plan: Visit Diagnoses:  1. Trochanteric bursitis, right hip   2. Greater trochanteric bursitis, right   3. Greater trochanteric bursitis, left   4. Degenerative disc disease, lumbar   5. Radiculopathy, lumbar region     Plan: Avoid frequent bending and stooping  No lifting greater than 10 lbs. May use ice or moist heat for pain. Weight loss is of benefit. Best medication for lumbar disc disease is arthritis medications like motrin, celebrex and naprosyn. Exercise is important to improve your indurance and does allow people to function better inspite of back pain.  Stretching exercises for the ITB that where given to you by PT. Voltaren gel locally.   Follow-Up Instructions: No follow-ups on file.   Orders:  Orders Placed This Encounter  Procedures  . Large Joint Inj   No orders of the defined types were placed in this encounter.     Procedures: Large Joint Inj: bilateral greater trochanter on 08/22/2019 9:59 AM Indications: pain Details: 25 G 3.5 in and 1.5 in needle, lateral approach  Arthrogram: No  Medications (Right): 4 mL bupivacaine 0.25 %; 40 mg methylPREDNISolone acetate 40 MG/ML Medications (Left): 4 mL bupivacaine 0.25 %; 40 mg methylPREDNISolone acetate 40 MG/ML Outcome: tolerated well, no immediate complications  bandaids applied bilaterally. Procedure, treatment alternatives, risks and benefits explained, specific risks discussed. Consent was given by the patient. Immediately prior to procedure a time out was called to verify the correct patient, procedure, equipment, support staff and site/side marked as required. Patient was prepped and draped in the usual sterile fashion.        Clinical Data: No additional findings.   Subjective: Chief Complaint  Patient presents with  . Right Hip - Follow-up    Wants repeat Greater Troch injections  . Left Hip - Follow-up    Wants repeat Greater Troch injections    54 year old female with history of bilateral hip pain. She is post bilateral great trochanteric bursa injection with improvement in the pain pattern. She has completed PT and has a home exercise program but the pain seems to be recurring bilaterally just posterior and lateral distal to the greater trochanters. This is worse with stair climbing.    Review of Systems  Constitutional: Positive for unexpected weight change. Negative for activity change, appetite change, chills, diaphoresis, fatigue and fever.  HENT: Negative.  Negative for congestion, dental problem, drooling, ear discharge, ear pain, facial swelling, hearing loss, mouth sores, nosebleeds, postnasal drip, rhinorrhea, sinus pressure, sinus pain, sneezing, sore throat, tinnitus, trouble swallowing and voice change.   Eyes: Negative.  Negative for photophobia, pain, discharge, redness, itching and visual disturbance.  Respiratory: Positive for shortness of breath and wheezing. Negative for apnea, cough, choking, chest tightness and stridor.   Cardiovascular: Negative.  Negative for chest pain, palpitations and leg swelling.  Gastrointestinal: Negative for abdominal distention, abdominal pain, anal bleeding, blood in stool, constipation, diarrhea, nausea and rectal pain.  Endocrine: Negative.  Negative for cold intolerance, heat intolerance, polydipsia, polyphagia and polyuria.  Genitourinary: Negative for difficulty urinating, dyspareunia, dysuria, flank pain and urgency.  Musculoskeletal: Positive for back pain. Negative for arthralgias, gait problem, joint swelling, myalgias and neck pain.  Allergic/Immunologic: Negative for environmental allergies, food allergies and immunocompromised state.   Neurological: Positive for numbness. Negative for dizziness, tremors, seizures, syncope, facial asymmetry, speech difficulty, weakness, light-headedness and headaches.  Hematological: Negative for adenopathy. Does not bruise/bleed easily.  Psychiatric/Behavioral: Negative.  Negative for agitation, behavioral problems, confusion, decreased concentration, dysphoric mood, hallucinations, self-injury, sleep disturbance and suicidal ideas. The patient is not nervous/anxious and is not hyperactive.      Objective: Vital Signs: BP 112/72 (BP Location: Left Arm, Patient Position: Sitting)   Pulse 62   Ht 5\' 7"  (1.702 m)   Wt 149 lb (67.6 kg)   BMI 23.34 kg/m   Physical Exam Constitutional:      Appearance: She is well-developed.  HENT:     Head: Normocephalic and atraumatic.  Eyes:     Pupils: Pupils are equal, round, and reactive to light.  Pulmonary:     Effort: Pulmonary effort is normal.     Breath sounds: Normal breath sounds.  Abdominal:     General: Bowel sounds are normal.     Palpations: Abdomen is soft.  Musculoskeletal:        General: Normal range of motion.     Cervical back: Normal range of motion and neck supple.     Lumbar back: Negative right straight leg raise test and negative left straight leg raise test.  Skin:    General: Skin is warm and dry.  Neurological:     Mental Status: She is alert and oriented to person, place, and time.  Psychiatric:        Behavior: Behavior normal.        Thought Content: Thought content normal.        Judgment: Judgment normal.     Back Exam   Tenderness  The patient is experiencing tenderness in the lumbar.  Muscle Strength  The patient has normal back strength.  Tests  Straight leg raise right: negative Straight leg raise left: negative  Other  Erythema: no back redness Scars: absent  Comments:  Tender bilateral trochanteric area just distal and posterior to the crest of the trochanters in the area of insertion  of fibers of the ITB into the posterior femur.       Specialty Comments:  No specialty comments available.  Imaging: No results found.   PMFS History: Patient Active Problem List   Diagnosis Date Noted  . Hyperlipidemia 07/18/2017  . Ulnar neuropathy 04/18/2017  . GERD (gastroesophageal reflux disease) 10/19/2016  . Prediabetes 05/03/2016  . Cubital tunnel syndrome on right 03/01/2016  . Numbness and tingling of right arm 01/01/2016  . Chlamydia infection 01/27/2015  . Smoking 12/26/2013  . Chronic lower back pain 04/13/2013  . Sciatica 04/13/2013  . Chronic neck pain 04/13/2013  . Spondylosis of cervical spine 04/13/2013   Past Medical History:  Diagnosis Date  . Acid reflux   . Allergy   . Carpal tunnel syndrome   . Depression   . Diverticulitis   . Hyperlipidemia   . Low back pain   . Neck pain   . Sacroiliac inflammation ([place])   . Sciatica   . Seasonal allergies     Family History  Problem Relation Age of Onset  . Healthy Mother   . Other Father        Unsure of medical history  . Asthma Maternal Aunt   . Diabetes Maternal Aunt   .  Cancer Maternal Aunt   . Breast cancer Maternal Aunt   . Hypertension Maternal Grandmother   . Colon cancer Neg Hx   . Pancreatic cancer Neg Hx   . Rectal cancer Neg Hx   . Stomach cancer Neg Hx   . Colon polyps Neg Hx   . Esophageal cancer Neg Hx     Past Surgical History:  Procedure Laterality Date  . ABDOMINAL SURGERY    . ANKLE SURGERY     lt.  Marland Kitchen PARTIAL HYSTERECTOMY    . right breast cyst removed     . URETHRAL DIVERTICULUM REPAIR     uretrral diverticultis   Social History   Occupational History  . Occupation: Unemployed  Tobacco Use  . Smoking status: Current Every Day Smoker    Packs/day: 0.50    Years: 35.00    Pack years: 17.50    Types: Cigarettes  . Smokeless tobacco: Never Used  Vaping Use  . Vaping Use: Never used  Substance and Sexual Activity  . Alcohol use: Yes    Comment: Social only  - seldom  . Drug use: No  . Sexual activity: Never

## 2019-08-27 MED FILL — FLUTICASONE PROP 50 MCG SPR: 50 | 30 days supply | Qty: 16 | Fill #2

## 2019-08-27 MED FILL — OXYBUTYNIN 5 MG TABLET: 5 | 30 days supply | Qty: 60 | Fill #4

## 2019-08-27 MED FILL — AMITRIPTYLINE HCL 25 MG TAB: 25 | 30 days supply | Qty: 30 | Fill #0

## 2019-09-03 ENCOUNTER — Other Ambulatory Visit (INDEPENDENT_AMBULATORY_CARE_PROVIDER_SITE_OTHER): Payer: Self-pay | Admitting: Specialist

## 2019-09-03 DIAGNOSIS — M542 Cervicalgia: Secondary | ICD-10-CM

## 2019-09-03 DIAGNOSIS — M5136 Other intervertebral disc degeneration, lumbar region: Secondary | ICD-10-CM

## 2019-09-03 MED FILL — CYCLOBENZAPRINE 10 MG TAB: 10 | 30 days supply | Qty: 60 | Fill #2

## 2019-09-03 MED FILL — ATORVASTATIN CALCIUM 20 MG: 20 | 30 days supply | Qty: 30 | Fill #2

## 2019-09-04 MED FILL — ACETAMINOPHEN-COD #3 TABLET: 300-30 | 5 days supply | Qty: 15 | Fill #0

## 2019-09-05 DIAGNOSIS — F4310 Post-traumatic stress disorder, unspecified: Secondary | ICD-10-CM | POA: Diagnosis not present

## 2019-09-05 DIAGNOSIS — F331 Major depressive disorder, recurrent, moderate: Secondary | ICD-10-CM | POA: Diagnosis not present

## 2019-09-10 ENCOUNTER — Other Ambulatory Visit: Payer: Self-pay | Admitting: Specialist

## 2019-09-10 MED FILL — DULoxetine HCL 60 MG CPEP: 60 | 30 days supply | Qty: 30 | Fill #4

## 2019-09-11 MED FILL — NAPROXEN 500 MG TABLET: 500 | 30 days supply | Qty: 60 | Fill #0

## 2019-09-19 DIAGNOSIS — F331 Major depressive disorder, recurrent, moderate: Secondary | ICD-10-CM | POA: Diagnosis not present

## 2019-09-19 DIAGNOSIS — F4310 Post-traumatic stress disorder, unspecified: Secondary | ICD-10-CM | POA: Diagnosis not present

## 2019-09-25 MED FILL — AMITRIPTYLINE HCL 25 MG TAB: 25 | 30 days supply | Qty: 30 | Fill #1

## 2019-09-26 MED FILL — OXYBUTYNIN 5 MG TABLET: 5 | 30 days supply | Qty: 60 | Fill #5

## 2019-10-02 DIAGNOSIS — F331 Major depressive disorder, recurrent, moderate: Secondary | ICD-10-CM | POA: Diagnosis not present

## 2019-10-02 DIAGNOSIS — F4310 Post-traumatic stress disorder, unspecified: Secondary | ICD-10-CM | POA: Diagnosis not present

## 2019-10-02 MED FILL — ATORVASTATIN CALCIUM 20 MG: 20 | 30 days supply | Qty: 30 | Fill #3

## 2019-10-10 ENCOUNTER — Ambulatory Visit
Admission: RE | Admit: 2019-10-10 | Discharge: 2019-10-10 | Disposition: A | Discharge status: Home / Self Care | Payer: Medicaid Other | Source: Ambulatory Visit | Attending: Family Medicine | Admitting: Family Medicine

## 2019-10-10 ENCOUNTER — Other Ambulatory Visit: Payer: Self-pay

## 2019-10-10 DIAGNOSIS — Z1231 Encounter for screening mammogram for malignant neoplasm of breast: Secondary | ICD-10-CM | POA: Diagnosis not present

## 2019-10-11 ENCOUNTER — Other Ambulatory Visit: Payer: Self-pay | Admitting: Family Medicine

## 2019-10-11 DIAGNOSIS — F331 Major depressive disorder, recurrent, moderate: Secondary | ICD-10-CM | POA: Diagnosis not present

## 2019-10-11 DIAGNOSIS — G8929 Other chronic pain: Secondary | ICD-10-CM

## 2019-10-11 DIAGNOSIS — F4310 Post-traumatic stress disorder, unspecified: Secondary | ICD-10-CM | POA: Diagnosis not present

## 2019-10-11 MED FILL — PREGABALIN 50 MG CAPS: 50 | 60 days supply | Qty: 60 | Fill #0

## 2019-10-11 MED FILL — DULoxetine HCL 60 MG CPEP: 60 | 30 days supply | Qty: 30 | Fill #5

## 2019-10-11 NOTE — Telephone Encounter (Signed)
Requested medication (s) are due for refill today: yes  Requested medication (s) are on the active medication list: yes  Last refill:  08/07/19  Future visit scheduled: yes  Notes to clinic:  not delegated    Requested Prescriptions  Pending Prescriptions Disp Refills   pregabalin (LYRICA) 50 MG capsule [Pharmacy Med Name: PREGABALIN 50 MG CAPS 50 Capsule] 60 capsule 5    Sig: Take 1 capsule (50 mg total) by mouth at bedtime as needed.      Not Delegated - Neurology:  Anticonvulsants - Controlled Failed - 10/11/2019  9:11 AM      Failed - This refill cannot be delegated      Passed - Valid encounter within last 12 months    Recent Outpatient Visits           3 months ago Prediabetes   [place], Enobong, MD   6 months ago Tobacco abuse   [place], Enobong, MD   9 months ago Tension headache   [place], Enobong, MD   1 year ago Tension headache   [place], Enobong, MD   1 year ago Prediabetes   [place], Enobong, MD       Future Appointments             In 1 week Charlott Rakes, MD [place]   In 4 months Louanne Skye, Daleen Bo, MD [place]

## 2019-10-16 ENCOUNTER — Other Ambulatory Visit: Payer: Self-pay | Admitting: Family Medicine

## 2019-10-16 DIAGNOSIS — G8929 Other chronic pain: Secondary | ICD-10-CM

## 2019-10-16 MED FILL — NAPROXEN 500 MG TABLET: 500 | 30 days supply | Qty: 60 | Fill #1

## 2019-10-16 MED FILL — CYCLOBENZAPRINE 10 MG TAB: 10 | 30 days supply | Qty: 60 | Fill #0

## 2019-10-16 NOTE — Telephone Encounter (Signed)
Requested medication (s) are due for refill today -yes  Requested medication (s) are on the active medication list -yes  Future visit scheduled -yes  Last refill: 09/03/19  Notes to clinic: Request for non delegated Rx  Requested Prescriptions  Pending Prescriptions Disp Refills   cyclobenzaprine (FLEXERIL) 10 MG tablet [Pharmacy Med Name: CYCLOBENZAPRINE 10 MG TAB 10 Tablet] 60 tablet 2    Sig: Take 1 tablet (10 mg total) by mouth 2 (two) times daily as needed for muscle spasms.      Not Delegated - Analgesics:  Muscle Relaxants Failed - 10/16/2019 12:18 PM      Failed - This refill cannot be delegated      Passed - Valid encounter within last 6 months    Recent Outpatient Visits           3 months ago Prediabetes   [place] Charlott Rakes, MD   6 months ago Tobacco abuse   [place], Enobong, MD   9 months ago Tension headache   [place], Enobong, MD   1 year ago Tension headache   [facility] Charlott Rakes, MD   1 year ago Prediabetes   [place], Charlane Ferretti, MD       Future Appointments             In 1 week Charlott Rakes, MD [place]   In 4 months Louanne Skye, Daleen Bo, MD [place]                Requested Prescriptions  Pending Prescriptions Disp Refills   cyclobenzaprine (FLEXERIL) 10 MG tablet [Pharmacy Med Name: CYCLOBENZAPRINE 10 MG TAB 10 Tablet] 60 tablet 2    Sig: Take 1 tablet (10 mg total) by mouth 2 (two) times daily as needed for muscle spasms.      Not Delegated - Analgesics:  Muscle Relaxants Failed - 10/16/2019 12:18 PM      Failed - This refill cannot be delegated      Passed - Valid encounter within last 6 months    Recent Outpatient Visits           3 months ago Prediabetes   [place], Enobong, MD   6 months ago Tobacco abuse   [place], Enobong, MD   9 months ago Tension headache   [place], Enobong, MD   1 year ago Tension headache   [place], Enobong, MD   1 year ago Prediabetes   [place], Enobong, MD       Future Appointments             In 1 week Charlott Rakes, MD [place]   In 4 months Louanne Skye, Daleen Bo, MD [place]

## 2019-10-18 DIAGNOSIS — F4310 Post-traumatic stress disorder, unspecified: Secondary | ICD-10-CM | POA: Diagnosis not present

## 2019-10-18 DIAGNOSIS — F331 Major depressive disorder, recurrent, moderate: Secondary | ICD-10-CM | POA: Diagnosis not present

## 2019-10-23 ENCOUNTER — Ambulatory Visit: Payer: Medicaid Other | Attending: Family Medicine | Admitting: Family Medicine

## 2019-10-23 ENCOUNTER — Other Ambulatory Visit: Payer: Self-pay

## 2019-10-23 ENCOUNTER — Other Ambulatory Visit: Payer: Self-pay | Admitting: Family Medicine

## 2019-10-23 ENCOUNTER — Encounter: Payer: Self-pay | Admitting: Family Medicine

## 2019-10-23 VITALS — BP 118/73 | HR 81 | Ht 67.0 in | Wt 150.2 lb

## 2019-10-23 DIAGNOSIS — M5441 Lumbago with sciatica, right side: Secondary | ICD-10-CM | POA: Diagnosis not present

## 2019-10-23 DIAGNOSIS — G44229 Chronic tension-type headache, not intractable: Secondary | ICD-10-CM | POA: Diagnosis not present

## 2019-10-23 DIAGNOSIS — F17200 Nicotine dependence, unspecified, uncomplicated: Secondary | ICD-10-CM

## 2019-10-23 DIAGNOSIS — Z23 Encounter for immunization: Secondary | ICD-10-CM

## 2019-10-23 DIAGNOSIS — G8929 Other chronic pain: Secondary | ICD-10-CM

## 2019-10-23 DIAGNOSIS — M5442 Lumbago with sciatica, left side: Secondary | ICD-10-CM

## 2019-10-23 MED ORDER — NICOTINE 21 MG/24HR TD PT24: 21.0000 mg | MEDICATED_PATCH | Freq: Every day | TRANSDERMAL | 2 refills | Status: DC

## 2019-10-23 MED ORDER — AMITRIPTYLINE HCL 25 MG PO TABS: 25.0000 mg | ORAL_TABLET | Freq: Every day | ORAL | 6 refills | Status: DC

## 2019-10-23 MED FILL — NICOTINE 21 MG/24HR PATCH: 21 | 28 days supply | Qty: 28 | Fill #0

## 2019-10-23 MED FILL — AMITRIPTYLINE HCL 25 MG TAB: 25 | 30 days supply | Qty: 30 | Fill #0

## 2019-10-23 NOTE — Progress Notes (Signed)
Subjective:  Patient ID: Cheryl King, female    DOB: Jan 12, 1966  Age: 54 y.o. MRN: [id number]  CC: Prediabetes   HPI Cheryl King is 54 year old female with Medical history significant for Prediabetes (diet controlled A1c 5.6) chronic low back pain with sciatica, GERD, hyperlipidemia, tobacco abuse here for a follow-up visit. She is requesting an increased dose of nicotine patches as the current 14 mg patch has been insufficient.  She smokes about half a pack of cigarettes per day. Also requests a refill of amitriptyline which she uses for chronic headaches. Her low back pain is the same and is rated as 7/10.  Sometimes while doing dishes pain will cause her to bend over the sink.  Back pain is managed by her orthopedic Dr. Louanne Skye.  Past Medical History:  Diagnosis Date  . Acid reflux   . Allergy   . Carpal tunnel syndrome   . Depression   . Diverticulitis   . Hyperlipidemia   . Low back pain   . Neck pain   . Sacroiliac inflammation ([place])   . Sciatica   . Seasonal allergies     Past Surgical History:  Procedure Laterality Date  . ABDOMINAL SURGERY    . ANKLE SURGERY     lt.  Marland Kitchen PARTIAL HYSTERECTOMY    . right breast cyst removed     . URETHRAL DIVERTICULUM REPAIR     uretrral diverticultis    Family History  Problem Relation Age of Onset  . Healthy Mother   . Other Father        Unsure of medical history  . Asthma Maternal Aunt   . Diabetes Maternal Aunt   . Cancer Maternal Aunt   . Breast cancer Maternal Aunt   . Hypertension Maternal Grandmother   . Colon cancer Neg Hx   . Pancreatic cancer Neg Hx   . Rectal cancer Neg Hx   . Stomach cancer Neg Hx   . Colon polyps Neg Hx   . Esophageal cancer Neg Hx     Allergies  Allergen Reactions  . Percocet [Oxycodone-Acetaminophen] Nausea Only  . Vicodin [Hydrocodone-Acetaminophen] Nausea Only  . Penicillins Rash    Outpatient Medications Prior to Visit  Medication Sig Dispense Refill  .  albuterol (VENTOLIN HFA) 108 (90 Base) MCG/ACT inhaler Inhale 2 puffs into the lungs every 6 (six) hours as needed for wheezing or shortness of breath. 18 g 1  . atorvastatin (LIPITOR) 20 MG tablet Take 1 tablet (20 mg total) by mouth daily. 90 tablet 1  . cetirizine (ZYRTEC) 10 MG tablet Take 1 tablet (10 mg total) by mouth daily. 90 tablet 1  . cyclobenzaprine (FLEXERIL) 10 MG tablet TAKE 1 TABLET (10 MG TOTAL) BY MOUTH 2 (TWO) TIMES DAILY AS NEEDED FOR MUSCLE SPASMS. 60 tablet 2  . diclofenac sodium (VOLTAREN) 1 % GEL Apply 2 g topically 4 (four) times daily. 5 Tube 2  . DULoxetine (CYMBALTA) 60 MG capsule Take 1 capsule (60 mg total) by mouth daily. 30 capsule 6  . fluticasone (FLONASE) 50 MCG/ACT nasal spray PLACE 2 SPRAYS INTO BOTH NOSTRILS DAILY. 16 g 2  . Lactobacillus CAPS Take 1 capsule by mouth daily.    . Multiple Vitamin (MULTIVITAMIN WITH MINERALS) TABS tablet Take 1 tablet by mouth daily.    . naproxen (NAPROSYN) 500 MG tablet TAKE 1 TABLET BY MOUTH TWO TIMES DAILY WITH MEALS 60 tablet 3  . ondansetron (ZOFRAN) 4 MG tablet TAKE 1 TABLET BY MOUTH EVERY 8 HOURS  AS NEEDED FOR NAUSEA OR VOMITING. 60 tablet 1  . oxybutynin (DITROPAN) 5 MG tablet Take 1 tablet (5 mg total) by mouth 2 (two) times daily. 60 tablet 6  . pregabalin (LYRICA) 50 MG capsule TAKE 1 CAPSULE (50 MG TOTAL) BY MOUTH AT BEDTIME AS NEEDED. 60 capsule 5  . RESTASIS 0.05 % ophthalmic emulsion   3  . triamcinolone cream (KENALOG) 0.1 % Apply 1 application topically 2 (two) times daily. 45 g 1  . amitriptyline (ELAVIL) 25 MG tablet Take 1 tablet (25 mg total) by mouth at bedtime. 30 tablet 3  . nicotine (NICODERM CQ) 14 mg/24hr patch Place 1 patch (14 mg total) onto the skin daily. For 4 weeks then 7mg /24hr daily for  4 weeks 28 patch 1  . traMADol (ULTRAM) 50 MG tablet Take by mouth every 6 (six) hours as needed. (Patient not taking: Reported on 10/23/2019)     Facility-Administered Medications Prior to Visit  Medication  Dose Route Frequency Provider Last Rate Last Admin  . 0.9 %  sodium chloride infusion  500 mL Intravenous Once Nelida Meuse III, MD         ROS Review of Systems  Constitutional: Negative for activity change, appetite change and fatigue.  HENT: Negative for congestion, sinus pressure and sore throat.   Eyes: Negative for visual disturbance.  Respiratory: Negative for cough, chest tightness, shortness of breath and wheezing.   Cardiovascular: Negative for chest pain and palpitations.  Gastrointestinal: Negative for abdominal distention, abdominal pain and constipation.  Endocrine: Negative for polydipsia.  Genitourinary: Negative for dysuria and frequency.  Musculoskeletal: Positive for back pain. Negative for arthralgias.  Skin: Negative for rash.  Neurological: Negative for tremors, light-headedness and numbness.  Hematological: Does not bruise/bleed easily.  Psychiatric/Behavioral: Negative for agitation and behavioral problems.    Objective:  BP 118/73   Pulse 81   Ht 5\' 7"  (1.702 m)   Wt 150 lb 3.2 oz (68.1 kg)   SpO2 95%   BMI 23.52 kg/m   BP/Weight 10/23/2019 08/22/2019 1/32/4401  Systolic BP 027 253 96  Diastolic BP 73 72 62  Wt. (Lbs) 150.2 149 149  BMI 23.52 23.34 23.34      Physical Exam Constitutional:      Appearance: She is well-developed.  Neck:     Vascular: No JVD.  Cardiovascular:     Rate and Rhythm: Normal rate.     Heart sounds: Normal heart sounds. No murmur heard.   Pulmonary:     Effort: Pulmonary effort is normal.     Breath sounds: Normal breath sounds. No wheezing or rales.  Chest:     Chest wall: No tenderness.  Abdominal:     General: Bowel sounds are normal. There is no distension.     Palpations: Abdomen is soft. There is no mass.     Tenderness: There is no abdominal tenderness.  Musculoskeletal:        General: Normal range of motion.     Right lower leg: No edema.     Left lower leg: No edema.     Comments: Negative  straight leg raise bilaterally  Neurological:     Mental Status: She is alert and oriented to person, place, and time.  Psychiatric:        Mood and Affect: Mood normal.     CMP Latest Ref Rng & Units 07/04/2019 09/20/2018 03/09/2018  Glucose 65 - 99 mg/dL 86 92 86  BUN 6 - 24 mg/dL 23  17 18  Creatinine 0.57 - 1.00 mg/dL 0.82 0.72 0.69  Sodium 134 - 144 mmol/L 139 141 142  Potassium 3.5 - 5.2 mmol/L 4.9 4.9 4.5  Chloride 96 - 106 mmol/L 101 102 102  CO2 20 - 29 mmol/L 26 27 23   Calcium 8.7 - 10.2 mg/dL 9.8 9.7 9.7  Total Protein 6.0 - 8.5 g/dL 7.3 6.7 7.1  Total Bilirubin 0.0 - 1.2 mg/dL 0.3 0.3 0.4  Alkaline Phos 48 - 121 IU/L 108 113 118(H)  AST 0 - 40 IU/L 22 28 18   ALT 0 - 32 IU/L 12 27 10     Lipid Panel     Component Value Date/Time   CHOL 192 07/04/2019 1003   TRIG 65 07/04/2019 1003   HDL 64 07/04/2019 1003   CHOLHDL 3.0 07/04/2019 1003   CHOLHDL 3.2 12/26/2013 1319   VLDL 18 12/26/2013 1319   LDLCALC 116 (H) 07/04/2019 1003    CBC    Component Value Date/Time   WBC 8.6 04/18/2017 1003   WBC 9.3 04/13/2013 1152   RBC 4.74 04/18/2017 1003   RBC 4.94 04/13/2013 1152   HGB 14.9 04/18/2017 1003   HCT 44.7 04/18/2017 1003   PLT 236 04/18/2017 1003   MCV 94 04/18/2017 1003   MCH 31.4 04/18/2017 1003   MCH 32.2 04/13/2013 1152   MCHC 33.3 04/18/2017 1003   MCHC 34.6 04/13/2013 1152   RDW 14.5 04/18/2017 1003   LYMPHSABS 2.6 04/18/2017 1003   MONOABS 0.9 04/13/2013 1152   EOSABS 0.1 04/18/2017 1003   BASOSABS 0.0 04/18/2017 1003    Lab Results  Component Value Date   HGBA1C 5.6 (A) 07/04/2019    Assessment & Plan:  1. Chronic tension-type headache, not intractable Stable - amitriptyline (ELAVIL) 25 MG tablet; Take 1 tablet (25 mg total) by mouth at bedtime.  Dispense: 30 tablet; Refill: 6  2. Smoking Spent 3 minutes counseling on smoking cessation and she is working on quitting CT low-dose chest for lung cancer screening was negative - nicotine  (NICODERM CQ) 21 mg/24hr patch; Place 1 patch (21 mg total) onto the skin daily. For 4 weeks then 7mg /24hr daily for  4 weeks  Dispense: 28 patch; Refill: 2  3. Chronic midline low back pain with bilateral sciatica Uncontrolled Continue Cymbalta, Lyrica She received Tylenol 3 from orthopedics Advised to participate in yoga and other exercises for back strengthening.    Meds ordered this encounter  Medications  . nicotine (NICODERM CQ) 21 mg/24hr patch    Sig: Place 1 patch (21 mg total) onto the skin daily. For 4 weeks then 7mg /24hr daily for  4 weeks    Dispense:  28 patch    Refill:  2  . amitriptyline (ELAVIL) 25 MG tablet    Sig: Take 1 tablet (25 mg total) by mouth at bedtime.    Dispense:  30 tablet    Refill:  6    Follow-up: Return in about 3 months (around 01/23/2020) for Disease management.       Charlott Rakes, MD, FAAFP. [facility] [place], [place]   10/23/2019, 10:06 AM

## 2019-10-23 NOTE — Progress Notes (Signed)
Wants to get a higher dose of the nicotine patches.  Having pain in lower back.  Needs refills

## 2019-10-23 NOTE — Patient Instructions (Signed)

## 2019-10-24 DIAGNOSIS — F4310 Post-traumatic stress disorder, unspecified: Secondary | ICD-10-CM | POA: Diagnosis not present

## 2019-10-24 DIAGNOSIS — F331 Major depressive disorder, recurrent, moderate: Secondary | ICD-10-CM | POA: Diagnosis not present

## 2019-10-30 MED FILL — OXYBUTYNIN 5 MG TABLET: 5 | 30 days supply | Qty: 60 | Fill #6

## 2019-10-30 MED FILL — ATORVASTATIN CALCIUM 20 MG: 20 | 90 days supply | Qty: 90 | Fill #0

## 2019-10-31 ENCOUNTER — Other Ambulatory Visit (INDEPENDENT_AMBULATORY_CARE_PROVIDER_SITE_OTHER): Payer: Self-pay | Admitting: Specialist

## 2019-10-31 DIAGNOSIS — F331 Major depressive disorder, recurrent, moderate: Secondary | ICD-10-CM | POA: Diagnosis not present

## 2019-10-31 DIAGNOSIS — F4310 Post-traumatic stress disorder, unspecified: Secondary | ICD-10-CM | POA: Diagnosis not present

## 2019-10-31 DIAGNOSIS — M5136 Other intervertebral disc degeneration, lumbar region: Secondary | ICD-10-CM

## 2019-10-31 DIAGNOSIS — M542 Cervicalgia: Secondary | ICD-10-CM

## 2019-10-31 NOTE — Telephone Encounter (Signed)
Can you please advise on this? Dr. Louanne Skye is out of the office this week.

## 2019-11-01 ENCOUNTER — Other Ambulatory Visit (INDEPENDENT_AMBULATORY_CARE_PROVIDER_SITE_OTHER): Payer: Self-pay | Admitting: Surgical

## 2019-11-01 MED FILL — ACETAMINOPHEN-COD #3 TABLET: 300-30 | 4 days supply | Qty: 15 | Fill #0

## 2019-11-07 DIAGNOSIS — F331 Major depressive disorder, recurrent, moderate: Secondary | ICD-10-CM | POA: Diagnosis not present

## 2019-11-07 DIAGNOSIS — F4310 Post-traumatic stress disorder, unspecified: Secondary | ICD-10-CM | POA: Diagnosis not present

## 2019-11-13 MED FILL — DULoxetine HCL 60 MG CPEP: 60 | 30 days supply | Qty: 30 | Fill #6

## 2019-11-14 MED FILL — PROAIR HFA 90 MCG INHALER: 108 (90 BAS | 25 days supply | Qty: 9 | Fill #1

## 2019-11-16 ENCOUNTER — Other Ambulatory Visit (HOSPITAL_COMMUNITY): Payer: Self-pay | Admitting: Emergency Medicine

## 2019-11-16 ENCOUNTER — Ambulatory Visit (HOSPITAL_COMMUNITY)
Admission: EM | Admit: 2019-11-16 | Discharge: 2019-11-16 | Disposition: A | Discharge status: Home / Self Care | Payer: Medicaid Other | Attending: Family Medicine | Admitting: Family Medicine

## 2019-11-16 ENCOUNTER — Encounter (HOSPITAL_COMMUNITY): Payer: Self-pay | Admitting: Emergency Medicine

## 2019-11-16 ENCOUNTER — Other Ambulatory Visit: Payer: Self-pay

## 2019-11-16 DIAGNOSIS — R300 Dysuria: Secondary | ICD-10-CM | POA: Diagnosis not present

## 2019-11-16 LAB — POCT URINALYSIS DIPSTICK, ED / UC
Bilirubin Urine: NEGATIVE
Glucose, UA: NEGATIVE mg/dL
Hgb urine dipstick: NEGATIVE
Ketones, ur: NEGATIVE mg/dL
Nitrite: NEGATIVE
Protein, ur: NEGATIVE mg/dL
Specific Gravity, Urine: 1.025 (ref 1.005–1.030)
Urobilinogen, UA: 0.2 mg/dL (ref 0.0–1.0)
pH: 6.5 (ref 5.0–8.0)

## 2019-11-16 MED ORDER — PHENAZOPYRIDINE HCL 200 MG PO TABS
200.0000 mg | ORAL_TABLET | Freq: Three times a day (TID) | ORAL | 0 refills | Status: DC | PRN
Start: 2019-11-16 — End: 2020-11-10

## 2019-11-16 MED ORDER — NITROFURANTOIN MONOHYD MACRO 100 MG PO CAPS
100.0000 mg | ORAL_CAPSULE | Freq: Two times a day (BID) | ORAL | 0 refills | Status: DC
Start: 2019-11-16 — End: 2019-11-16

## 2019-11-16 MED FILL — NITROFURANTOIN MONO-MCR 100: 100 | 5 days supply | Qty: 10 | Fill #0

## 2019-11-16 NOTE — ED Triage Notes (Signed)
Patient c/o ABD pain and dysuria since Sunday.   Patient endorses similar symptoms w/ a previous UTI.   Patient states she has intermittent burning w/ urination.   History of lower back pain.

## 2019-11-16 NOTE — ED Provider Notes (Signed)
Cheryl King    CSN: [id number] Arrival date & time: 11/16/19  4098      History   Chief Complaint Chief Complaint  Patient presents with  . Dysuria    HPI Cheryl King is a 54 y.o. female.   Cheryl King presents with complaints of burning with urination intermittently since 10/24, which was worse last night. Intermittent pelvic pain which has been coming and going, and can be to both left and right pelvis. No current pain. No vaginal symptoms. She has had a hysterectomy. No fevers. No gi symptoms. Similar to uti's in the past. She only recently became sexually active again approximately two weeks ago. No known std exposure.    ROS per HPI, negative if not otherwise mentioned.      Past Medical History:  Diagnosis Date  . Acid reflux   . Allergy   . Carpal tunnel syndrome   . Depression   . Diverticulitis   . Hyperlipidemia   . Low back pain   . Neck pain   . Sacroiliac inflammation ([place])   . Sciatica   . Seasonal allergies     Patient Active Problem List   Diagnosis Date Noted  . Hyperlipidemia 07/18/2017  . Ulnar neuropathy 04/18/2017  . GERD (gastroesophageal reflux disease) 10/19/2016  . Prediabetes 05/03/2016  . Cubital tunnel syndrome on right 03/01/2016  . Numbness and tingling of right arm 01/01/2016  . Chlamydia infection 01/27/2015  . Smoking 12/26/2013  . Chronic lower back pain 04/13/2013  . Sciatica 04/13/2013  . Chronic neck pain 04/13/2013  . Spondylosis of cervical spine 04/13/2013    Past Surgical History:  Procedure Laterality Date  . ABDOMINAL SURGERY    . ANKLE SURGERY     lt.  [facility] PARTIAL HYSTERECTOMY    . right breast cyst removed     . URETHRAL DIVERTICULUM REPAIR     uretrral diverticultis    OB History    Gravida  1   Para      Term      Preterm      AB  1   Living        SAB  1   TAB      Ectopic      Multiple      Live Births               Home Medications    Prior to  Admission medications   Medication Sig Start Date End Date Taking? Authorizing Provider  amitriptyline (ELAVIL) 25 MG tablet Take 1 tablet (25 mg total) by mouth at bedtime. 10/23/19  Yes Charlott Rakes, MD  atorvastatin (LIPITOR) 20 MG tablet Take 1 tablet (20 mg total) by mouth daily. 07/04/19  Yes Charlott Rakes, MD  cetirizine (ZYRTEC) 10 MG tablet Take 1 tablet (10 mg total) by mouth daily. 04/18/17  Yes Newlin, Charlane Ferretti, MD  cyclobenzaprine (FLEXERIL) 10 MG tablet TAKE 1 TABLET (10 MG TOTAL) BY MOUTH 2 (TWO) TIMES DAILY AS NEEDED FOR MUSCLE SPASMS. 10/16/19  Yes Charlott Rakes, MD  diclofenac sodium (VOLTAREN) 1 % GEL Apply 2 g topically 4 (four) times daily. 03/18/17  Yes Jessy Oto, MD  DULoxetine (CYMBALTA) 60 MG capsule Take 1 capsule (60 mg total) by mouth daily. 07/04/19  Yes Newlin, Enobong, MD  fluticasone (FLONASE) 50 MCG/ACT nasal spray PLACE 2 SPRAYS INTO BOTH NOSTRILS DAILY. 05/01/19  Yes Charlott Rakes, MD  Lactobacillus CAPS Take 1 capsule by mouth daily.   Yes [provider]  Multiple Vitamin (MULTIVITAMIN WITH MINERALS) TABS tablet Take 1 tablet by mouth daily.   Yes [provider]  naproxen (NAPROSYN) 500 MG tablet TAKE 1 TABLET BY MOUTH TWO TIMES DAILY WITH MEALS 09/10/19  Yes Jessy Oto, MD  oxybutynin (DITROPAN) 5 MG tablet Take 1 tablet (5 mg total) by mouth 2 (two) times daily. 07/04/19  Yes Newlin, Charlane Ferretti, MD  pregabalin (LYRICA) 50 MG capsule TAKE 1 CAPSULE (50 MG TOTAL) BY MOUTH AT BEDTIME AS NEEDED. 10/11/19  Yes Newlin, Enobong, MD  RESTASIS 0.05 % ophthalmic emulsion  04/13/17  Yes [provider]  albuterol (VENTOLIN HFA) 108 (90 Base) MCG/ACT inhaler Inhale 2 puffs into the lungs every 6 (six) hours as needed for wheezing or shortness of breath. 07/04/19   Charlott Rakes, MD  nicotine (NICODERM CQ) 21 mg/24hr patch Place 1 patch (21 mg total) onto the skin daily. For 4 weeks then 7mg /24hr daily for  4 weeks 10/23/19   Charlott Rakes, MD   nitrofurantoin, macrocrystal-monohydrate, (MACROBID) 100 MG capsule Take 1 capsule (100 mg total) by mouth 2 (two) times daily for 5 days. 11/16/19 11/21/19  Augusto Gamble B, NP  ondansetron (ZOFRAN) 4 MG tablet TAKE 1 TABLET BY MOUTH EVERY 8 HOURS AS NEEDED FOR NAUSEA OR VOMITING. 10/19/16   Charlott Rakes, MD  phenazopyridine (PYRIDIUM) 200 MG tablet Take 1 tablet (200 mg total) by mouth 3 (three) times daily as needed for pain. 11/16/19   Zigmund Gottron, NP  traMADol (ULTRAM) 50 MG tablet Take by mouth every 6 (six) hours as needed. Patient not taking: Reported on 10/23/2019    [provider]  triamcinolone cream (KENALOG) 0.1 % Apply 1 application topically 2 (two) times daily. 01/03/19   Charlott Rakes, MD    Family History Family History  Problem Relation Age of Onset  . Healthy Mother   . Other Father        Unsure of medical history  . Asthma Maternal Aunt   . Diabetes Maternal Aunt   . Cancer Maternal Aunt   . Breast cancer Maternal Aunt   . Hypertension Maternal Grandmother   . Colon cancer Neg Hx   . Pancreatic cancer Neg Hx   . Rectal cancer Neg Hx   . Stomach cancer Neg Hx   . Colon polyps Neg Hx   . Esophageal cancer Neg Hx     Social History Social History   Tobacco Use  . Smoking status: Current Every Day Smoker    Packs/day: 0.50    Years: 35.00    Pack years: 17.50    Types: Cigarettes  . Smokeless tobacco: Never Used  Vaping Use  . Vaping Use: Never used  Substance Use Topics  . Alcohol use: Yes    Comment: Social only - seldom  . Drug use: No     Allergies   Percocet [oxycodone-acetaminophen], Vicodin [hydrocodone-acetaminophen], and Penicillins   Review of Systems Review of Systems   Physical Exam Triage Vital Signs ED Triage Vitals  Enc Vitals Group     BP 11/16/19 0920 109/77     Pulse Rate 11/16/19 0920 70     Resp 11/16/19 0920 14     Temp 11/16/19 0920 98.4 F (36.9 C)     Temp Source 11/16/19 0920 Oral     SpO2  11/16/19 0920 95 %     Weight 11/16/19 0916 147 lb (66.7 kg)     Height 11/16/19 0916 5\' 7"  (1.702 m)  Head Circumference --      Peak Flow --      Pain Score 11/16/19 0916 10     Pain Loc --      Pain Edu? --      Excl. in [place]? --    No data found.  Updated Vital Signs BP 109/77 (BP Location: Left Arm)   Pulse 70   Temp 98.4 F (36.9 C) (Oral)   Resp 14   Ht 5\' 7"  (1.702 m)   Wt 147 lb (66.7 kg)   SpO2 95%   BMI 23.02 kg/m   Visual Acuity Right Eye Distance:   Left Eye Distance:   Bilateral Distance:    Right Eye Near:   Left Eye Near:    Bilateral Near:     Physical Exam Constitutional:      General: She is not in acute distress.    Appearance: She is well-developed.  Cardiovascular:     Rate and Rhythm: Normal rate.  Pulmonary:     Effort: Pulmonary effort is normal.  Abdominal:     Palpations: Abdomen is not rigid.     Tenderness: There is no abdominal tenderness. There is no right CVA tenderness, left CVA tenderness, guarding or rebound.  Genitourinary:    Comments: Denies sores, lesions, vaginal bleeding; no pelvic pain; gu exam deferred at this time, vaginal self swab collected.   Skin:    General: Skin is warm and dry.  Neurological:     Mental Status: She is alert and oriented to person, place, and time.      UC Treatments / Results  Labs (all labs ordered are listed, but only abnormal results are displayed) Labs Reviewed  POCT URINALYSIS DIPSTICK, ED / UC - Abnormal; Notable for the following components:      Result Value   Leukocytes,Ua SMALL (*)    All other components within normal limits  URINE CULTURE  CERVICOVAGINAL ANCILLARY ONLY    EKG   Radiology No results found.  Procedures Procedures (including critical care time)  Medications Ordered in UC Medications - No data to display  Initial Impression / Assessment and Plan / UC Course  I have reviewed the triage vital signs and the nursing notes.  Pertinent labs & imaging  results that were available during my care of the patient were reviewed by me and considered in my medical decision making (see chart for details).     Small leuks only but with dysuria. Opted to initiate macrobid pending urine culture and vaginal cytology. Pyridium provided. Patient verbalized understanding and agreeable to plan.  Patient verbalized understanding and agreeable to plan.   Final Clinical Impressions(s) / UC Diagnoses   Final diagnoses:  Dysuria     Discharge Instructions     I have started antibiotics for UTI with culture to confirm this.  We will notify of you any positive findings from your vaginal testing or if any changes to treatment are needed. If normal or otherwise without concern to your results, we will not call you. Please log on to your MyChart to review your results if interested in so.   Drink plenty of water to empty bladder regularly. Avoid alcohol and caffeine as these may irritate the bladder.   If symptoms worsen or do not improve in the next week to return to be seen or to follow up with your PCP.      ED Prescriptions    Medication Sig Dispense Auth. Provider   nitrofurantoin, macrocrystal-monohydrate, (MACROBID)  100 MG capsule Take 1 capsule (100 mg total) by mouth 2 (two) times daily for 5 days. 10 capsule Augusto Gamble B, NP   phenazopyridine (PYRIDIUM) 200 MG tablet Take 1 tablet (200 mg total) by mouth 3 (three) times daily as needed for pain. 6 tablet Zigmund Gottron, NP     PDMP not reviewed this encounter.   Zigmund Gottron, NP 11/16/19 1116

## 2019-11-16 NOTE — Discharge Instructions (Signed)
I have started antibiotics for UTI with culture to confirm this.  We will notify of you any positive findings from your vaginal testing or if any changes to treatment are needed. If normal or otherwise without concern to your results, we will not call you. Please log on to your MyChart to review your results if interested in so.   Drink plenty of water to empty bladder regularly. Avoid alcohol and caffeine as these may irritate the bladder.   If symptoms worsen or do not improve in the next week to return to be seen or to follow up with your PCP.

## 2019-11-18 LAB — URINE CULTURE: Culture: >=100000 — AB

## 2019-11-19 ENCOUNTER — Telehealth (HOSPITAL_COMMUNITY): Payer: Self-pay | Admitting: Emergency Medicine

## 2019-11-19 ENCOUNTER — Other Ambulatory Visit (HOSPITAL_COMMUNITY): Payer: Self-pay | Admitting: Internal Medicine

## 2019-11-19 LAB — CERVICOVAGINAL ANCILLARY ONLY
Bacterial Vaginitis (gardnerella): POSITIVE — AB
Candida Glabrata: NEGATIVE
Candida Vaginitis: NEGATIVE
Chlamydia: NEGATIVE
Comment: NEGATIVE
Comment: NEGATIVE
Comment: NEGATIVE
Comment: NEGATIVE
Comment: NEGATIVE
Comment: NORMAL
Neisseria Gonorrhea: NEGATIVE
Trichomonas: NEGATIVE

## 2019-11-19 MED ORDER — METRONIDAZOLE 0.75 % VA GEL: 1.0000 | Freq: Every day | VAGINAL | 0 refills | Status: DC

## 2019-11-19 MED FILL — METRONIDAZOLE 0.75 % GEL: 0.75 | 5 days supply | Qty: 70 | Fill #0

## 2019-11-21 DIAGNOSIS — F331 Major depressive disorder, recurrent, moderate: Secondary | ICD-10-CM | POA: Diagnosis not present

## 2019-11-21 DIAGNOSIS — F4310 Post-traumatic stress disorder, unspecified: Secondary | ICD-10-CM | POA: Diagnosis not present

## 2019-11-27 MED FILL — CYCLOBENZAPRINE 10 MG TAB: 10 | 30 days supply | Qty: 60 | Fill #1

## 2019-11-27 MED FILL — NAPROXEN 500 MG TABLET: 500 | 30 days supply | Qty: 60 | Fill #2

## 2019-11-27 MED FILL — AMITRIPTYLINE HCL 25 MG TAB: 25 | 30 days supply | Qty: 30 | Fill #1

## 2019-12-03 ENCOUNTER — Other Ambulatory Visit (INDEPENDENT_AMBULATORY_CARE_PROVIDER_SITE_OTHER): Payer: Self-pay | Admitting: Specialist

## 2019-12-03 DIAGNOSIS — M5136 Other intervertebral disc degeneration, lumbar region: Secondary | ICD-10-CM

## 2019-12-03 DIAGNOSIS — M542 Cervicalgia: Secondary | ICD-10-CM

## 2019-12-03 MED FILL — OXYBUTYNIN 5 MG TABLET: 5 | 30 days supply | Qty: 60 | Fill #0

## 2019-12-04 MED FILL — ACETAMINOPHEN-COD #3 TABLET: 300-30 | 5 days supply | Qty: 15 | Fill #0

## 2019-12-05 DIAGNOSIS — F331 Major depressive disorder, recurrent, moderate: Secondary | ICD-10-CM | POA: Diagnosis not present

## 2019-12-05 DIAGNOSIS — F4310 Post-traumatic stress disorder, unspecified: Secondary | ICD-10-CM | POA: Diagnosis not present

## 2019-12-11 MED FILL — DULoxetine HCL 60 MG CPEP: 60 | 30 days supply | Qty: 30 | Fill #0

## 2019-12-11 MED FILL — PREGABALIN 50 MG CAPS: 50 | 60 days supply | Qty: 60 | Fill #1

## 2019-12-19 DIAGNOSIS — F331 Major depressive disorder, recurrent, moderate: Secondary | ICD-10-CM | POA: Diagnosis not present

## 2019-12-19 DIAGNOSIS — F4310 Post-traumatic stress disorder, unspecified: Secondary | ICD-10-CM | POA: Diagnosis not present

## 2020-01-01 MED FILL — CYCLOBENZAPRINE 10 MG TAB: 10 | 30 days supply | Qty: 60 | Fill #2

## 2020-01-01 MED FILL — NAPROXEN 500 MG TABLET: 500 | 30 days supply | Qty: 60 | Fill #3

## 2020-01-01 MED FILL — OXYBUTYNIN 5 MG TABLET: 5 | 30 days supply | Qty: 60 | Fill #1

## 2020-01-01 MED FILL — AMITRIPTYLINE HCL 25 MG TAB: 25 | 30 days supply | Qty: 30 | Fill #2

## 2020-01-02 DIAGNOSIS — F4310 Post-traumatic stress disorder, unspecified: Secondary | ICD-10-CM | POA: Diagnosis not present

## 2020-01-02 DIAGNOSIS — F331 Major depressive disorder, recurrent, moderate: Secondary | ICD-10-CM | POA: Diagnosis not present

## 2020-01-15 MED FILL — DULoxetine HCL 60 MG CPEP: 60 | 30 days supply | Qty: 30 | Fill #1

## 2020-01-23 DIAGNOSIS — F331 Major depressive disorder, recurrent, moderate: Secondary | ICD-10-CM | POA: Diagnosis not present

## 2020-01-23 DIAGNOSIS — F4310 Post-traumatic stress disorder, unspecified: Secondary | ICD-10-CM | POA: Diagnosis not present

## 2020-01-25 ENCOUNTER — Other Ambulatory Visit (INDEPENDENT_AMBULATORY_CARE_PROVIDER_SITE_OTHER): Payer: Self-pay | Admitting: Specialist

## 2020-01-25 DIAGNOSIS — M542 Cervicalgia: Secondary | ICD-10-CM

## 2020-01-25 DIAGNOSIS — M5136 Other intervertebral disc degeneration, lumbar region: Secondary | ICD-10-CM

## 2020-01-28 ENCOUNTER — Encounter: Payer: Self-pay | Admitting: Family Medicine

## 2020-01-28 ENCOUNTER — Other Ambulatory Visit: Payer: Self-pay | Admitting: Family Medicine

## 2020-01-28 ENCOUNTER — Ambulatory Visit: Payer: Medicaid Other | Attending: Family Medicine | Admitting: Family Medicine

## 2020-01-28 ENCOUNTER — Other Ambulatory Visit: Payer: Self-pay

## 2020-01-28 VITALS — BP 101/66 | HR 83 | Ht 67.0 in | Wt 149.0 lb

## 2020-01-28 DIAGNOSIS — E7800 Pure hypercholesterolemia, unspecified: Secondary | ICD-10-CM | POA: Diagnosis not present

## 2020-01-28 DIAGNOSIS — M5441 Lumbago with sciatica, right side: Secondary | ICD-10-CM

## 2020-01-28 DIAGNOSIS — G44229 Chronic tension-type headache, not intractable: Secondary | ICD-10-CM | POA: Diagnosis not present

## 2020-01-28 DIAGNOSIS — M5442 Lumbago with sciatica, left side: Secondary | ICD-10-CM

## 2020-01-28 DIAGNOSIS — Z720 Tobacco use: Secondary | ICD-10-CM

## 2020-01-28 DIAGNOSIS — G8929 Other chronic pain: Secondary | ICD-10-CM

## 2020-01-28 DIAGNOSIS — N39498 Other specified urinary incontinence: Secondary | ICD-10-CM

## 2020-01-28 MED ORDER — ATORVASTATIN CALCIUM 20 MG PO TABS: 20.0000 mg | ORAL_TABLET | Freq: Every day | ORAL | 6 refills | Status: DC

## 2020-01-28 MED ORDER — DULOXETINE HCL 60 MG PO CPEP: 60.0000 mg | ORAL_CAPSULE | Freq: Every day | ORAL | 6 refills | Status: DC

## 2020-01-28 MED ORDER — OXYBUTYNIN CHLORIDE 5 MG PO TABS: 5.0000 mg | ORAL_TABLET | Freq: Two times a day (BID) | ORAL | 6 refills | Status: DC

## 2020-01-28 MED ORDER — AMITRIPTYLINE HCL 50 MG PO TABS: 50.0000 mg | ORAL_TABLET | Freq: Every day | ORAL | 6 refills | Status: DC

## 2020-01-28 MED ORDER — LIDOCAINE 5 % EX PTCH: 1.0000 | MEDICATED_PATCH | CUTANEOUS | 6 refills | Status: DC

## 2020-01-28 MED ORDER — PREGABALIN 50 MG PO CAPS: 50.0000 mg | ORAL_CAPSULE | Freq: Every evening | ORAL | 5 refills | Status: DC | PRN

## 2020-01-28 MED ORDER — CYCLOBENZAPRINE HCL 10 MG PO TABS: 10.0000 mg | ORAL_TABLET | Freq: Two times a day (BID) | ORAL | 6 refills | Status: DC | PRN

## 2020-01-28 MED FILL — ATORVASTATIN CALCIUM 20 MG: 20 | 30 days supply | Qty: 30 | Fill #0

## 2020-01-28 MED FILL — AMITRIPTYLINE HCL 50 MG TAB: 50 | 30 days supply | Qty: 30 | Fill #0

## 2020-01-28 MED FILL — OXYBUTYNIN 5 MG TABLET: 5 | 30 days supply | Qty: 60 | Fill #0

## 2020-01-28 MED FILL — PREGABALIN 50 MG CAPS: 50 | 60 days supply | Qty: 60 | Fill #0

## 2020-01-28 MED FILL — CYCLOBENZAPRINE 10 MG TAB: 10 | 30 days supply | Qty: 60 | Fill #0

## 2020-01-28 NOTE — Progress Notes (Signed)
Virtual Visit via Telephone Note  I connected with Cheryl King, on 01/28/2020 at 6:40 PM by telephone due to the COVID-19 pandemic and verified that I am speaking with the correct person using two identifiers.   Consent: I discussed the limitations, risks, security and privacy concerns of performing an evaluation and management service by telephone and the availability of in person appointments. I also discussed with the patient that there may be a patient responsible charge related to this service. The patient expressed understanding and agreed to proceed.   Location of Patient: Clinic  Location of Provider: Home   Persons participating in Telemedicine visit: Mayia Megill Farrington-CMA Dr. Margarita Rana     History of Present Illness: Cheryl King is 55 year old female with Medical history significant for Prediabetes (diet controlled A1c 5.6) chronic low back pain with sciatica, GERD, hyperlipidemia,tobacco abuse here for a follow-up visit.   Her headaches are frequent and current dose of Amitriptyline has been ineffective. Headaches occur about 3 times/week. Her low back pain is uncontrolled and she has completed her sessions of PT and will be seeing her Orthopedic surgeon Dr Louanne Skye soon. Pain is in her mid back and goes from her butt cheek down her legs. Sometimes she needs a cane to ambulate. Denies presence of recent falls or loss of sphincteric function  Still smoking ans is not ready to quit Lost Dad on New Year's day Working on her disability as she was previously denied and has an upcoming appointment with a mental health professional for evaluation.  Past Medical History:  Diagnosis Date  . Acid reflux   . Allergy   . Carpal tunnel syndrome   . Depression   . Diverticulitis   . Hyperlipidemia   . Low back pain   . Neck pain   . Sacroiliac inflammation ([place])   . Sciatica   . Seasonal allergies    Allergies  Allergen Reactions  . Percocet  [Oxycodone-Acetaminophen] Nausea Only  . Vicodin [Hydrocodone-Acetaminophen] Nausea Only  . Penicillins Rash    Current Outpatient Medications on File Prior to Visit  Medication Sig Dispense Refill  . albuterol (VENTOLIN HFA) 108 (90 Base) MCG/ACT inhaler Inhale 2 puffs into the lungs every 6 (six) hours as needed for wheezing or shortness of breath. 18 g 1  . cetirizine (ZYRTEC) 10 MG tablet Take 1 tablet (10 mg total) by mouth daily. 90 tablet 1  . diclofenac sodium (VOLTAREN) 1 % GEL Apply 2 g topically 4 (four) times daily. 5 Tube 2  . fluticasone (FLONASE) 50 MCG/ACT nasal spray PLACE 2 SPRAYS INTO BOTH NOSTRILS DAILY. 16 g 2  . Lactobacillus CAPS Take 1 capsule by mouth daily.    . Multiple Vitamin (MULTIVITAMIN WITH MINERALS) TABS tablet Take 1 tablet by mouth daily.    . naproxen (NAPROSYN) 500 MG tablet TAKE 1 TABLET BY MOUTH TWO TIMES DAILY WITH MEALS 60 tablet 3  . nicotine (NICODERM CQ) 21 mg/24hr patch Place 1 patch (21 mg total) onto the skin daily. For 4 weeks then 4m/24hr daily for  4 weeks 28 patch 2  . RESTASIS 0.05 % ophthalmic emulsion   3  . triamcinolone cream (KENALOG) 0.1 % Apply 1 application topically 2 (two) times daily. 45 g 1  . phenazopyridine (PYRIDIUM) 200 MG tablet Take 1 tablet (200 mg total) by mouth 3 (three) times daily as needed for pain. (Patient not taking: Reported on 01/28/2020) 6 tablet 0   Current Facility-Administered Medications on File Prior to Visit  Medication  Dose Route Frequency Provider Last Rate Last Admin  . 0.9 %  sodium chloride infusion  500 mL Intravenous Once Doran Stabler, MD        Observations/Objective: Today's Vitals   01/28/20 0938 01/28/20 0940  BP: 101/66   Pulse: 83   SpO2: 96%   Weight: 149 lb (67.6 kg)   Height: '5\' 7"'  (1.702 m)   PainSc:  10-Worst pain ever   Body mass index is 23.34 kg/m.  Awake, alert, oriented x3 Respiratory: not in acute distress MSK: limited flexion due to pain; normal gait Psych:  normal mood   CMP Latest Ref Rng & Units 07/04/2019 09/20/2018 03/09/2018  Glucose 65 - 99 mg/dL 86 92 86  BUN 6 - 24 mg/dL '23 17 18  ' Creatinine 0.57 - 1.00 mg/dL 0.82 0.72 0.69  Sodium 134 - 144 mmol/L 139 141 142  Potassium 3.5 - 5.2 mmol/L 4.9 4.9 4.5  Chloride 96 - 106 mmol/L 101 102 102  CO2 20 - 29 mmol/L '26 27 23  ' Calcium 8.7 - 10.2 mg/dL 9.8 9.7 9.7  Total Protein 6.0 - 8.5 g/dL 7.3 6.7 7.1  Total Bilirubin 0.0 - 1.2 mg/dL 0.3 0.3 0.4  Alkaline Phos 48 - 121 IU/L 108 113 118(H)  AST 0 - 40 IU/L '22 28 18  ' ALT 0 - 32 IU/L '12 27 10     ' Lipid Panel     Component Value Date/Time   CHOL 192 07/04/2019 1003   TRIG 65 07/04/2019 1003   HDL 64 07/04/2019 1003   CHOLHDL 3.0 07/04/2019 1003   CHOLHDL 3.2 12/26/2013 1319   VLDL 18 12/26/2013 1319   LDLCALC 116 (H) 07/04/2019 1003   LABVLDL 12 07/04/2019 1003    Assessment and Plan: 1. Chronic tension-type headache, not intractable Uncontrolled Increased Amitrytyline dose If she is unable to tolerate dose increase, will add on Imitrex for breakthrough - amitriptyline (ELAVIL) 50 MG tablet; Take 1 tablet (50 mg total) by mouth at bedtime.  Dispense: 30 tablet; Refill: 6  2. Chronic midline low back pain with bilateral sciatica Uncontrolled Lidoderm patch added to regimen She might benefit from a back brace and has been advised to speak with her Orthopedic about this - lidocaine (LIDODERM) 5 %; Place 1 patch onto the skin daily. Remove & Discard patch within 12 hours or as directed by MD  Dispense: 30 patch; Refill: 6 - pregabalin (LYRICA) 50 MG capsule; Take 1 capsule (50 mg total) by mouth at bedtime as needed.  Dispense: 60 capsule; Refill: 5 - DULoxetine (CYMBALTA) 60 MG capsule; Take 1 capsule (60 mg total) by mouth daily.  Dispense: 30 capsule; Refill: 6 - cyclobenzaprine (FLEXERIL) 10 MG tablet; Take 1 tablet (10 mg total) by mouth 2 (two) times daily as needed for muscle spasms.  Dispense: 60 tablet; Refill: 6  3. Other  urinary incontinence Stable - oxybutynin (DITROPAN) 5 MG tablet; Take 1 tablet (5 mg total) by mouth 2 (two) times daily.  Dispense: 60 tablet; Refill: 6  4. Pure hypercholesterolemia Controlled Low cholesterol diet - Lipid panel - CMP14+EGFR - atorvastatin (LIPITOR) 20 MG tablet; Take 1 tablet (20 mg total) by mouth daily.  Dispense: 30 tablet; Refill: 6  5. Tobacco abuse Spent 3 minutes counseling on smoking cessation and she is not ready to quit   Follow Up Instructions: Return in about 3 months (around 04/27/2020) for chronic disease management.    I discussed the assessment and treatment plan with the patient. The  patient was provided an opportunity to ask questions and all were answered. The patient agreed with the plan and demonstrated an understanding of the instructions.   The patient was advised to call back or seek an in-person evaluation if the symptoms worsen or if the condition fails to improve as anticipated.     I provided 19 minutes total of non-face-to-face time during this encounter including median intraservice time, reviewing previous notes, investigations, ordering medications, medical decision making, coordinating care and patient verbalized understanding at the end of the visit.     Charlott Rakes, MD, FAAFP. [facility] [place], [place]   01/28/2020, 6:40 PM

## 2020-01-28 NOTE — Progress Notes (Signed)
Having pain in back down legs. Needs to discuss Amitriptyline.

## 2020-01-29 LAB — CMP14+EGFR
ALT: 16 IU/L (ref 0–32)
AST: 22 IU/L (ref 0–40)
Albumin/Globulin Ratio: 1.6 (ref 1.2–2.2)
Albumin: 4.2 g/dL (ref 3.8–4.9)
Alkaline Phosphatase: 106 IU/L (ref 44–121)
BUN/Creatinine Ratio: 28 — ABNORMAL HIGH (ref 9–23)
BUN: 22 mg/dL (ref 6–24)
Bilirubin Total: 0.4 mg/dL (ref 0.0–1.2)
CO2: 23 mmol/L (ref 20–29)
Calcium: 9.5 mg/dL (ref 8.7–10.2)
Chloride: 102 mmol/L (ref 96–106)
Creatinine, Ser: 0.79 mg/dL (ref 0.57–1.00)
GFR calc Af Amer: 97 mL/min/{1.73_m2} (ref >59)
GFR calc non Af Amer: 84 mL/min/{1.73_m2} (ref >59)
Globulin, Total: 2.7 g/dL (ref 1.5–4.5)
Glucose: 84 mg/dL (ref 65–99)
Potassium: 4.4 mmol/L (ref 3.5–5.2)
Sodium: 138 mmol/L (ref 134–144)
Total Protein: 6.9 g/dL (ref 6.0–8.5)

## 2020-01-29 LAB — LIPID PANEL
Chol/HDL Ratio: 3.2 ratio (ref 0.0–4.4)
Cholesterol, Total: 191 mg/dL (ref 100–199)
HDL: 60 mg/dL (ref >39)
LDL Chol Calc (NIH): 117 mg/dL — ABNORMAL HIGH (ref 0–99)
Triglycerides: 74 mg/dL (ref 0–149)
VLDL Cholesterol Cal: 14 mg/dL (ref 5–40)

## 2020-02-01 ENCOUNTER — Telehealth: Payer: Self-pay

## 2020-02-01 NOTE — Telephone Encounter (Signed)
PA approved until 01/31/21 for Lidoderm

## 2020-02-06 DIAGNOSIS — F331 Major depressive disorder, recurrent, moderate: Secondary | ICD-10-CM | POA: Diagnosis not present

## 2020-02-06 DIAGNOSIS — F4310 Post-traumatic stress disorder, unspecified: Secondary | ICD-10-CM | POA: Diagnosis not present

## 2020-02-12 MED FILL — DULoxetine HCL 60 MG CPEP: 60 | 30 days supply | Qty: 30 | Fill #2

## 2020-02-15 ENCOUNTER — Encounter (HOSPITAL_COMMUNITY): Payer: Self-pay

## 2020-02-15 ENCOUNTER — Ambulatory Visit (HOSPITAL_COMMUNITY)
Admission: EM | Admit: 2020-02-15 | Discharge: 2020-02-15 | Disposition: A | Discharge status: Home / Self Care | Payer: Medicaid Other | Attending: Family Medicine | Admitting: Family Medicine

## 2020-02-15 ENCOUNTER — Other Ambulatory Visit: Payer: Self-pay

## 2020-02-15 ENCOUNTER — Other Ambulatory Visit (HOSPITAL_COMMUNITY): Payer: Self-pay | Admitting: Family Medicine

## 2020-02-15 DIAGNOSIS — R300 Dysuria: Secondary | ICD-10-CM | POA: Diagnosis not present

## 2020-02-15 DIAGNOSIS — R109 Unspecified abdominal pain: Secondary | ICD-10-CM

## 2020-02-15 DIAGNOSIS — N3001 Acute cystitis with hematuria: Secondary | ICD-10-CM | POA: Insufficient documentation

## 2020-02-15 LAB — POCT URINALYSIS DIPSTICK, ED / UC
Bilirubin Urine: NEGATIVE
Glucose, UA: NEGATIVE mg/dL
Ketones, ur: NEGATIVE mg/dL
Nitrite: POSITIVE — AB
Protein, ur: NEGATIVE mg/dL
Specific Gravity, Urine: 1.025 (ref 1.005–1.030)
Urobilinogen, UA: 0.2 mg/dL (ref 0.0–1.0)
pH: 6 (ref 5.0–8.0)

## 2020-02-15 MED ORDER — NITROFURANTOIN MONOHYD MACRO 100 MG PO CAPS
100.0000 mg | ORAL_CAPSULE | Freq: Two times a day (BID) | ORAL | 0 refills | Status: DC
Start: 2020-02-15 — End: 2020-02-15

## 2020-02-15 MED FILL — NITROFURANTOIN MONO-MCR 100: 100 | 5 days supply | Qty: 10 | Fill #0

## 2020-02-15 NOTE — ED Triage Notes (Signed)
Pt in with c/o abdominal pain and burning during urination that has been going on for 1 week  States that it only burns at night when she urinates  Pt has been taking AZO

## 2020-02-15 NOTE — Discharge Instructions (Addendum)
Treating you for a urinary tract infection.  Take the medication as prescribed.  We will send for culture. Drink plenty of water to stay hydrated Follow up as needed for continued or worsening symptoms

## 2020-02-17 LAB — URINE CULTURE: Culture: >=100000 — AB

## 2020-02-17 NOTE — ED Provider Notes (Signed)
[facility]    CSN: [id number] Arrival date & time: 02/15/20  1517      History   Chief Complaint Chief Complaint  Patient presents with  . Abdominal Pain  . burning during urination    HPI Cheryl King is a 55 y.o. female.   55 year old female presents today with lower abdominal pressure, dysuria.  This has been present x1 week.  Taking AZO without any relief.  Denies any back pain, flank pain, fever, nausea or vomiting.     Past Medical History:  Diagnosis Date  . Acid reflux   . Allergy   . Carpal tunnel syndrome   . Depression   . Diverticulitis   . Hyperlipidemia   . Low back pain   . Neck pain   . Sacroiliac inflammation ([place])   . Sciatica   . Seasonal allergies     Patient Active Problem List   Diagnosis Date Noted  . Hyperlipidemia 07/18/2017  . Ulnar neuropathy 04/18/2017  . GERD (gastroesophageal reflux disease) 10/19/2016  . Prediabetes 05/03/2016  . Cubital tunnel syndrome on right 03/01/2016  . Numbness and tingling of right arm 01/01/2016  . Chlamydia infection 01/27/2015  . Smoking 12/26/2013  . Chronic lower back pain 04/13/2013  . Sciatica 04/13/2013  . Chronic neck pain 04/13/2013  . Spondylosis of cervical spine 04/13/2013    Past Surgical History:  Procedure Laterality Date  . ABDOMINAL SURGERY    . ANKLE SURGERY     lt.  Marland Kitchen PARTIAL HYSTERECTOMY    . right breast cyst removed     . URETHRAL DIVERTICULUM REPAIR     uretrral diverticultis    OB History    Gravida  1   Para      Term      Preterm      AB  1   Living        SAB  1   IAB      Ectopic      Multiple      Live Births               Home Medications    Prior to Admission medications   Medication Sig Start Date End Date Taking? Authorizing Provider  nitrofurantoin, macrocrystal-monohydrate, (MACROBID) 100 MG capsule Take 1 capsule (100 mg total) by mouth 2 (two) times daily. 02/15/20  Yes Heywood Tokunaga A, NP  albuterol  (VENTOLIN HFA) 108 (90 Base) MCG/ACT inhaler Inhale 2 puffs into the lungs every 6 (six) hours as needed for wheezing or shortness of breath. 07/04/19   Charlott Rakes, MD  amitriptyline (ELAVIL) 50 MG tablet Take 1 tablet (50 mg total) by mouth at bedtime. 01/28/20   Charlott Rakes, MD  atorvastatin (LIPITOR) 20 MG tablet Take 1 tablet (20 mg total) by mouth daily. 01/28/20   Charlott Rakes, MD  cetirizine (ZYRTEC) 10 MG tablet Take 1 tablet (10 mg total) by mouth daily. 04/18/17   Charlott Rakes, MD  cyclobenzaprine (FLEXERIL) 10 MG tablet Take 1 tablet (10 mg total) by mouth 2 (two) times daily as needed for muscle spasms. 01/28/20   Charlott Rakes, MD  diclofenac sodium (VOLTAREN) 1 % GEL Apply 2 g topically 4 (four) times daily. 03/18/17   Jessy Oto, MD  DULoxetine (CYMBALTA) 60 MG capsule Take 1 capsule (60 mg total) by mouth daily. 01/28/20   Charlott Rakes, MD  fluticasone (FLONASE) 50 MCG/ACT nasal spray PLACE 2 SPRAYS INTO BOTH NOSTRILS DAILY. 05/01/19   Newlin, Charlane Ferretti,  MD  Lactobacillus CAPS Take 1 capsule by mouth daily.    [provider]  lidocaine (LIDODERM) 5 % Place 1 patch onto the skin daily. Remove & Discard patch within 12 hours or as directed by MD 01/28/20   Charlott Rakes, MD  Multiple Vitamin (MULTIVITAMIN WITH MINERALS) TABS tablet Take 1 tablet by mouth daily.    [provider]  naproxen (NAPROSYN) 500 MG tablet TAKE 1 TABLET BY MOUTH TWO TIMES DAILY WITH MEALS 09/10/19   Jessy Oto, MD  nicotine (NICODERM CQ) 21 mg/24hr patch Place 1 patch (21 mg total) onto the skin daily. For 4 weeks then 7mg /24hr daily for  4 weeks 10/23/19   Charlott Rakes, MD  oxybutynin (DITROPAN) 5 MG tablet Take 1 tablet (5 mg total) by mouth 2 (two) times daily. 01/28/20   Charlott Rakes, MD  phenazopyridine (PYRIDIUM) 200 MG tablet Take 1 tablet (200 mg total) by mouth 3 (three) times daily as needed for pain. Patient not taking: Reported on 01/28/2020 11/16/19   Augusto Gamble B, NP  pregabalin (LYRICA) 50 MG capsule Take 1 capsule (50 mg total) by mouth at bedtime as needed. 01/28/20   Charlott Rakes, MD  RESTASIS 0.05 % ophthalmic emulsion  04/13/17   [provider]  triamcinolone cream (KENALOG) 0.1 % Apply 1 application topically 2 (two) times daily. 01/03/19   Charlott Rakes, MD    Family History Family History  Problem Relation Age of Onset  . Healthy Mother   . Other Father        Unsure of medical history  . Asthma Maternal Aunt   . Diabetes Maternal Aunt   . Cancer Maternal Aunt   . Breast cancer Maternal Aunt   . Hypertension Maternal Grandmother   . Colon cancer Neg Hx   . Pancreatic cancer Neg Hx   . Rectal cancer Neg Hx   . Stomach cancer Neg Hx   . Colon polyps Neg Hx   . Esophageal cancer Neg Hx     Social History Social History   Tobacco Use  . Smoking status: Current Every Day Smoker    Packs/day: 0.50    Years: 35.00    Pack years: 17.50    Types: Cigarettes  . Smokeless tobacco: Never Used  Vaping Use  . Vaping Use: Never used  Substance Use Topics  . Alcohol use: Yes    Comment: Social only - seldom  . Drug use: No     Allergies   Percocet [oxycodone-acetaminophen], Vicodin [hydrocodone-acetaminophen], and Penicillins   Review of Systems Review of Systems   Physical Exam Triage Vital Signs ED Triage Vitals  Enc Vitals Group     BP 02/15/20 0908 126/84     Pulse Rate 02/15/20 0908 85     Resp 02/15/20 0908 20     Temp 02/15/20 0908 99.6 F (37.6 C)     Temp Source 02/15/20 0908 Oral     SpO2 02/15/20 0908 95 %     Weight --      Height --      Head Circumference --      Peak Flow --      Pain Score 02/15/20 0904 0     Pain Loc --      Pain Edu? --      Excl. in [place]? --    No data found.  Updated Vital Signs BP 126/84 (BP Location: Right Wrist)   Pulse 85   Temp 99.6 F (37.6  C) (Oral)   Resp 20   SpO2 95%   Visual Acuity Right Eye Distance:   Left Eye Distance:    Bilateral Distance:    Right Eye Near:   Left Eye Near:    Bilateral Near:     Physical Exam Vitals and nursing note reviewed.  Constitutional:      General: She is not in acute distress.    Appearance: Normal appearance. She is not ill-appearing, toxic-appearing or diaphoretic.  HENT:     Head: Normocephalic.  Eyes:     Conjunctiva/sclera: Conjunctivae normal.  Pulmonary:     Effort: Pulmonary effort is normal.  Musculoskeletal:        General: Normal range of motion.     Cervical back: Normal range of motion.  Skin:    General: Skin is warm and dry.     Findings: No rash.  Neurological:     Mental Status: She is alert.  Psychiatric:        Mood and Affect: Mood normal.      UC Treatments / Results  Labs (all labs ordered are listed, but only abnormal results are displayed) Labs Reviewed  URINE CULTURE - Abnormal; Notable for the following components:      Result Value   Culture >=100,000 COLONIES/mL ESCHERICHIA COLI (*)    Organism ID, Bacteria ESCHERICHIA COLI (*)    All other components within normal limits  POCT URINALYSIS DIPSTICK, ED / UC - Abnormal; Notable for the following components:   Hgb urine dipstick SMALL (*)    Nitrite POSITIVE (*)    Leukocytes,Ua SMALL (*)    All other components within normal limits    EKG   Radiology No results found.  Procedures Procedures (including critical care time)  Medications Ordered in UC Medications - No data to display  Initial Impression / Assessment and Plan / UC Course  I have reviewed the triage vital signs and the nursing notes.  Pertinent labs & imaging results that were available during my care of the patient were reviewed by me and considered in my medical decision making (see chart for details).     Acute cystitis Urine with small leuks, positive nitrites and small hemoglobin Sending for culture.  We will go ahead and treat for urinary tract infection based on symptoms and urinalysis.   Treating with Macrobid. Push fluids Follow up as needed for continued or worsening symptoms  Final Clinical Impressions(s) / UC Diagnoses   Final diagnoses:  Acute cystitis with hematuria     Discharge Instructions     Treating you for a urinary tract infection.  Take the medication as prescribed.  We will send for culture. Drink plenty of water to stay hydrated Follow up as needed for continued or worsening symptoms     ED Prescriptions    Medication Sig Dispense Auth. Provider   nitrofurantoin, macrocrystal-monohydrate, (MACROBID) 100 MG capsule Take 1 capsule (100 mg total) by mouth 2 (two) times daily. 10 capsule Loura Halt A, NP     PDMP not reviewed this encounter.   Loura Halt A, NP 02/17/20 1020

## 2020-02-18 ENCOUNTER — Other Ambulatory Visit: Payer: Self-pay | Admitting: Specialist

## 2020-02-18 MED FILL — NAPROXEN 500 MG TABLET: 500 | 30 days supply | Qty: 60 | Fill #0

## 2020-02-19 ENCOUNTER — Other Ambulatory Visit: Payer: Self-pay | Admitting: Family Medicine

## 2020-02-19 DIAGNOSIS — R0982 Postnasal drip: Secondary | ICD-10-CM

## 2020-02-19 MED FILL — FLUTICASONE PROP 50 MCG SPR: 50 | 30 days supply | Qty: 16 | Fill #0

## 2020-02-20 DIAGNOSIS — F331 Major depressive disorder, recurrent, moderate: Secondary | ICD-10-CM | POA: Diagnosis not present

## 2020-02-20 DIAGNOSIS — F4310 Post-traumatic stress disorder, unspecified: Secondary | ICD-10-CM | POA: Diagnosis not present

## 2020-02-21 ENCOUNTER — Encounter: Payer: Self-pay | Admitting: Specialist

## 2020-02-21 ENCOUNTER — Other Ambulatory Visit: Payer: Self-pay

## 2020-02-21 ENCOUNTER — Other Ambulatory Visit (INDEPENDENT_AMBULATORY_CARE_PROVIDER_SITE_OTHER): Payer: Self-pay | Admitting: Specialist

## 2020-02-21 ENCOUNTER — Ambulatory Visit (INDEPENDENT_AMBULATORY_CARE_PROVIDER_SITE_OTHER): Payer: Medicaid Other | Admitting: Specialist

## 2020-02-21 ENCOUNTER — Ambulatory Visit (INDEPENDENT_AMBULATORY_CARE_PROVIDER_SITE_OTHER): Payer: Medicaid Other

## 2020-02-21 VITALS — BP 107/60 | HR 72 | Ht 67.0 in | Wt 149.0 lb

## 2020-02-21 DIAGNOSIS — M7061 Trochanteric bursitis, right hip: Secondary | ICD-10-CM

## 2020-02-21 DIAGNOSIS — M5136 Other intervertebral disc degeneration, lumbar region: Secondary | ICD-10-CM

## 2020-02-21 DIAGNOSIS — M48062 Spinal stenosis, lumbar region with neurogenic claudication: Secondary | ICD-10-CM | POA: Diagnosis not present

## 2020-02-21 DIAGNOSIS — M7062 Trochanteric bursitis, left hip: Secondary | ICD-10-CM

## 2020-02-21 DIAGNOSIS — M4722 Other spondylosis with radiculopathy, cervical region: Secondary | ICD-10-CM | POA: Diagnosis not present

## 2020-02-21 MED ORDER — ACETAMINOPHEN-CODEINE #3 300-30 MG PO TABS: 1.0000 | ORAL_TABLET | ORAL | 0 refills | Status: DC | PRN

## 2020-02-21 MED ORDER — METHYLPREDNISOLONE ACETATE 40 MG/ML IJ SUSP
40.0000 mg | INTRAMUSCULAR | Status: AC | PRN
  Administered 2020-02-21: 40 mg via INTRA_ARTICULAR

## 2020-02-21 MED ORDER — BUPIVACAINE HCL 0.25 % IJ SOLN
4.0000 mL | INTRAMUSCULAR | Status: AC | PRN
  Administered 2020-02-21: 4 mL via INTRA_ARTICULAR

## 2020-02-21 MED ORDER — BUPIVACAINE HCL 0.25 % IJ SOLN
4.0000 mL | INTRAMUSCULAR | Status: AC | PRN
Start: 2020-02-21 — End: 2020-02-21
  Administered 2020-02-21: 4 mL via INTRA_ARTICULAR

## 2020-02-21 MED FILL — LIDOCAINE PATCH 5%: 5 | 30 days supply | Qty: 30 | Fill #0

## 2020-02-21 NOTE — Progress Notes (Signed)
Office Visit Note   Patient: Cheryl King           Date of Birth: March 21, 1965           MRN: [id number] Visit Date: 02/21/2020              Requested by: Charlott Rakes, MD [place],  [place] [id number] PCP: Charlott Rakes, MD   Assessment & Plan: Visit Diagnoses:  1. Degenerative disc disease, lumbar   2. Other spondylosis with radiculopathy, cervical region   3. Greater trochanteric bursitis, right   4. Greater trochanteric bursitis, left   5. Spinal stenosis of lumbar region with neurogenic claudication     Plan:  Avoid frequent bending and stooping  No lifting greater than 10 lbs. May use ice or moist heat for pain. Weight loss is of benefit. Best medication for lumbar disc disease is arthritis medications like motrin, celebrex and naprosyn. Exercise is important to improve your indurance and does allow people to function better inspite of back pain. Avoid overhead lifting and overhead use of the arms. Do not lift greater than 5 lbs. Adjust head rest in vehicle to prevent hyperextension if rear ended. Take extra precautions to avoid falling, including use of a cane if you feel weak.  Heat or ice to the areas of the trochanter bursa about the lateral hips is helpful. Naprosyn helps here also and transdermal voltaren gel Stretching exercise, pool walking and yoga are helpful  Hip Bursitis Rehab Ask your health care provider which exercises are safe for you. Do exercises exactly as told by your health care provider and adjust them as directed. It is normal to feel mild stretching, pulling, tightness, or discomfort as you do these exercises. Stop right away if you feel sudden pain or your pain gets worse. Do not begin these exercises until told by your health care provider. Stretching exercise This exercise warms up your muscles and joints and improves the movement and flexibility of your hip. This exercise also helps to relieve pain and  stiffness. Iliotibial band stretch An iliotibial band is a strong band of muscle tissue that runs from the outer side of your hip to the outer side of your thigh and knee. 1. Lie on your side with your left / right leg in the top position. 2. Bend your left / right knee and grab your ankle. Stretch out your bottom arm to help you balance. 3. Slowly bring your knee back so your thigh is behind your body. 4. Slowly lower your knee toward the floor until you feel a gentle stretch on the outside of your left / right thigh. If you do not feel a stretch and your knee will not fall farther, place the heel of your other foot on top of your knee and pull your knee down toward the floor with your foot. 5. Hold this position for __10________ seconds. 6. Slowly return to the starting position. Repeat __10________ times. Complete this exercise ___3_______ times a day.   Strengthening exercises These exercises build strength and endurance in your hip and pelvis. Endurance is the ability to use your muscles for a long time, even after they get tired. Bridge This exercise strengthens the muscles that move your thigh backward (hip extensors). 1. Lie on your back on a firm surface with your knees bent and your feet flat on the floor. 2. Tighten your buttocks muscles and lift your buttocks off the floor until your trunk is level with your  thighs. ? Do not arch your back. ? You should feel the muscles working in your buttocks and the back of your thighs. If you do not feel these muscles, slide your feet 1-2 inches (2.5-5 cm) farther away from your buttocks. ? If this exercise is too easy, try doing it with your arms crossed over your chest. 3. Hold this position for __________ seconds. 4. Slowly lower your hips to the starting position. 5. Let your muscles relax completely after each repetition. Repeat __________ times. Complete this exercise __________ times a day.   Squats This exercise strengthens the muscles  in front of your thigh and knee (quadriceps). 1. Stand in front of a table, with your feet and knees pointing straight ahead. You may rest your hands on the table for balance but not for support. 2. Slowly bend your knees and lower your hips like you are going to sit in a chair. ? Keep your weight over your heels, not over your toes. ? Keep your lower legs upright so they are parallel with the table legs. ? Do not let your hips go lower than your knees. ? Do not bend lower than told by your health care provider. ? If your hip pain increases, do not bend as low. 3. Hold the squat position for __________ seconds. 4. Slowly push with your legs to return to standing. Do not use your hands to pull yourself to standing. Repeat __________ times. Complete this exercise __________ times a day. Hip hike 1. Stand sideways on a bottom step. Stand on your left / right leg with your other foot unsupported next to the step. You can hold on to the railing or wall for balance if needed. 2. Keep your knees straight and your torso square. Then lift your left / right hip up toward the ceiling. 3. Hold this position for __________ seconds. 4. Slowly let your left / right hip lower toward the floor, past the starting position. Your foot should get closer to the floor. Do not lean or bend your knees. Repeat __________ times. Complete this exercise __________ times a day. Single leg stand 1. Without shoes, stand near a railing or in a doorway. You may hold on to the railing or door frame as needed for balance. 2. Squeeze your left / right buttock muscles, then lift up your other foot. ? Do not let your left / right hip push out to the side. ? It is helpful to stand in front of a mirror for this exercise so you can watch your hip. 3. Hold this position for __________ seconds. Repeat __________ times. Complete this exercise __________ times a day. This information is not intended to replace advice given to you by your  health care provider. Make sure you discuss any questions you have with your health care provider. Document Revised: 05/01/2018 Document Reviewed: 05/01/2018 Elsevier Patient Education  [place] Instructions: Return in about 6 months (around 08/20/2020).   Orders:  Orders Placed This Encounter  Procedures  . XR Lumbar Spine 2-3 Views   No orders of the defined types were placed in this encounter.     Procedures: Large Joint Inj: bilateral greater trochanter on 02/21/2020 10:04 AM Indications: pain Details: 22 G 3.5 in needle, lateral approach  Arthrogram: No  Medications (Right): 4 mL bupivacaine 0.25 %; 40 mg methylPREDNISolone acetate 40 MG/ML Medications (Left): 4 mL bupivacaine 0.25 %; 40 mg methylPREDNISolone acetate 40 MG/ML Outcome: tolerated well, no immediate complications  Band  aid applied Procedure, treatment alternatives, risks and benefits explained, specific risks discussed. Immediately prior to procedure a time out was called to verify the correct patient, procedure, equipment, support staff and site/side marked as required. Patient was prepped and draped in the usual sterile fashion.       Clinical Data: No additional findings.   Subjective: Chief Complaint  Patient presents with  . Lower Back - Follow-up    55 year old female with chronic low back and buttock pain. It is worse with any activity, mainly bending and stooping and lifting.  She is having pain with standing and walking and pain is primarily in the small of her back. She has taken tramadol from her primary Care MD up until 1-3 mo ago and her primary care stopped this as she was also receiving tylenol # 3 from [place]. She is not able to work due to pain. Describes pain as being 10 of 10. 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 Takes Lipitor and Zyrtec, She uses an inhaler, flonase for emphyesema early and is smoking about 1/2 ppd. She  has increased the elevil to 50 mg at night. She takes lyrica and oxybutin. She does not want to under go surgery for the lumbar spine.  Or when she is experiencing HAs. Takes the flexeril for pain in the lower back with muscle spasm.   Review of Systems  Constitutional: Positive for activity change (Pain is getting worse, she doesn't feel like doing anything). Negative for appetite change, chills, diaphoresis, fatigue, fever and unexpected weight change.  HENT: Positive for congestion, postnasal drip, rhinorrhea and sinus pain. Negative for dental problem, drooling, ear discharge, ear pain, facial swelling, hearing loss, mouth sores, nosebleeds, sneezing, sore throat, tinnitus, trouble swallowing and voice change.   Eyes: Negative.  Negative for photophobia, pain, discharge, redness, itching and visual disturbance.  Respiratory: Positive for shortness of breath and wheezing. Negative for apnea, cough, choking, chest tightness and stridor.   Cardiovascular: Negative for chest pain, palpitations and leg swelling.  Gastrointestinal: Negative.  Negative for abdominal distention, abdominal pain, anal bleeding, blood in stool, constipation, diarrhea, nausea, rectal pain and vomiting.  Endocrine: Negative.  Negative for cold intolerance, heat intolerance, polydipsia, polyphagia and polyuria.  Genitourinary: Negative.  Negative for difficulty urinating, dyspareunia, dysuria, enuresis, flank pain, frequency, genital sores, hematuria, menstrual problem, pelvic pain and urgency.  Musculoskeletal: Positive for arthralgias, back pain, gait problem, neck pain and neck stiffness. Negative for joint swelling and myalgias.  Skin: Negative.  Negative for color change, pallor, rash and wound.  Allergic/Immunologic: Positive for environmental allergies. Negative for food allergies and immunocompromised state.  Neurological: Positive for weakness. Negative for dizziness, tremors, seizures, syncope, facial asymmetry,  speech difficulty, light-headedness, numbness and headaches.  Hematological: Negative.  Negative for adenopathy. Does not bruise/bleed easily.  Psychiatric/Behavioral: Positive for dysphoric mood and sleep disturbance. Negative for agitation, behavioral problems, confusion, decreased concentration, hallucinations, self-injury and suicidal ideas. The patient is not nervous/anxious and is not hyperactive.      Objective: Vital Signs: BP 107/60 (BP Location: Left Arm, Patient Position: Sitting)   Pulse 72   Ht 5\' 7"  (1.702 m)   Wt 149 lb (67.6 kg)   BMI 23.34 kg/m   Physical Exam Constitutional:      Appearance: She is well-developed and well-nourished.  HENT:     Head: Normocephalic and atraumatic.  Eyes:     Extraocular Movements: EOM normal.     Pupils: Pupils  are equal, round, and reactive to light.  Pulmonary:     Effort: Pulmonary effort is normal.     Breath sounds: Normal breath sounds.  Abdominal:     General: Bowel sounds are normal.     Palpations: Abdomen is soft.  Musculoskeletal:     Cervical back: Normal range of motion and neck supple.     Lumbar back: Negative right straight leg raise test and negative left straight leg raise test.  Skin:    General: Skin is warm and dry.  Neurological:     Mental Status: She is alert and oriented to person, place, and time.  Psychiatric:        Mood and Affect: Mood and affect normal.        Behavior: Behavior normal.        Thought Content: Thought content normal.        Judgment: Judgment normal.     Back Exam   Tenderness  The patient is experiencing tenderness in the cervical and lumbar.  Range of Motion  Extension:  60 abnormal  Flexion:  60 abnormal  Lateral bend right: 60  Lateral bend left: 50  Rotation right: 60  Rotation left: 50   Muscle Strength  Right Quadriceps:  5/5  Right Hamstrings:  5/5  Left Hamstrings:  5/5   Tests  Straight leg raise right: negative Straight leg raise left:  negative  Reflexes  Patellar: 0/4 Achilles: 0/4  Other  Toe walk: normal Heel walk: normal Sensation: normal Erythema: no back redness Scars: absent  Comments:  Uses a can intermittantly, She has to bend knee with sitting to place shoes and socks.      Specialty Comments:  No specialty comments available.  Imaging: XR Lumbar Spine 2-3 Views  Result Date: 02/21/2020 Ap and lateral flexion and extension radiographs demonstrate degenerative disc narrowing L1-2, L2-3, L3-4 and L5-S1 with vacuum sign at L5-S1 and retrolisthesis of L2-3 and L5-S1 anterior disc osteophytes at L2-3 and L5-S1 with end plate sclerosis and cystic changes of the endplates at both [id number] and L5-S1. The degenerative changes appear to have progressed since the last radiograph from 01/2019.    PMFS History: Patient Active Problem List   Diagnosis Date Noted  . Hyperlipidemia 07/18/2017  . Ulnar neuropathy 04/18/2017  . GERD (gastroesophageal reflux disease) 10/19/2016  . Prediabetes 05/03/2016  . Cubital tunnel syndrome on right 03/01/2016  . Numbness and tingling of right arm 01/01/2016  . Chlamydia infection 01/27/2015  . Smoking 12/26/2013  . Chronic lower back pain 04/13/2013  . Sciatica 04/13/2013  . Chronic neck pain 04/13/2013  . Spondylosis of cervical spine 04/13/2013   Past Medical History:  Diagnosis Date  . Acid reflux   . Allergy   . Carpal tunnel syndrome   . Depression   . Diverticulitis   . Hyperlipidemia   . Low back pain   . Neck pain   . 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 Sciatica   . Seasonal allergies     Family History  Problem Relation Age of Onset  . Healthy Mother   . Other Father        Unsure of medical history  . Asthma Maternal Aunt   . Diabetes Maternal Aunt   . Cancer Maternal Aunt   . Breast cancer Maternal Aunt   . Hypertension Maternal Grandmother   . Colon cancer Neg Hx   . Pancreatic cancer Neg Hx   . Rectal cancer Neg Hx   .  Stomach cancer Neg Hx    . Colon polyps Neg Hx   . Esophageal cancer Neg Hx     Past Surgical History:  Procedure Laterality Date  . ABDOMINAL SURGERY    . ANKLE SURGERY     lt.  Marland Kitchen PARTIAL HYSTERECTOMY    . right breast cyst removed     . URETHRAL DIVERTICULUM REPAIR     uretrral diverticultis   Social History   Occupational History  . Occupation: Unemployed  Tobacco Use  . Smoking status: Current Every Day Smoker    Packs/day: 0.50    Years: 35.00    Pack years: 17.50    Types: Cigarettes  . Smokeless tobacco: Never Used  Vaping Use  . Vaping Use: Never used  Substance and Sexual Activity  . Alcohol use: Yes    Comment: Social only - seldom  . Drug use: No  . Sexual activity: Never

## 2020-02-21 NOTE — Patient Instructions (Addendum)
Avoid frequent bending and stooping  No lifting greater than 10 lbs. May use ice or moist heat for pain. Weight loss is of benefit. Best medication for lumbar disc disease is arthritis medications like motrin, celebrex and naprosyn. Exercise is important to improve your indurance and does allow people to function better inspite of back pain. Avoid overhead lifting and overhead use of the arms. Do not lift greater than 5 lbs. Adjust head rest in vehicle to prevent hyperextension if rear ended. Take extra precautions to avoid falling, including use of a cane if you feel weak.  Heat or ice to the areas of the trochanter bursa about the lateral hips is helpful. Naprosyn helps here also and transdermal voltaren gel Stretching exercise, pool walking and yoga are helpful  Hip Bursitis Rehab Ask your health care provider which exercises are safe for you. Do exercises exactly as told by your health care provider and adjust them as directed. It is normal to feel mild stretching, pulling, tightness, or discomfort as you do these exercises. Stop right away if you feel sudden pain or your pain gets worse. Do not begin these exercises until told by your health care provider. Stretching exercise This exercise warms up your muscles and joints and improves the movement and flexibility of your hip. This exercise also helps to relieve pain and stiffness. Iliotibial band stretch An iliotibial band is a strong band of muscle tissue that runs from the outer side of your hip to the outer side of your thigh and knee. 1. Lie on your side with your left / right leg in the top position. 2. Bend your left / right knee and grab your ankle. Stretch out your bottom arm to help you balance. 3. Slowly bring your knee back so your thigh is behind your body. 4. Slowly lower your knee toward the floor until you feel a gentle stretch on the outside of your left / right thigh. If you do not feel a stretch and your knee will not  fall farther, place the heel of your other foot on top of your knee and pull your knee down toward the floor with your foot. 5. Hold this position for __10________ seconds. 6. Slowly return to the starting position. Repeat __10________ times. Complete this exercise ___3_______ times a day.   Strengthening exercises These exercises build strength and endurance in your hip and pelvis. Endurance is the ability to use your muscles for a long time, even after they get tired. Bridge This exercise strengthens the muscles that move your thigh backward (hip extensors). 1. Lie on your back on a firm surface with your knees bent and your feet flat on the floor. 2. Tighten your buttocks muscles and lift your buttocks off the floor until your trunk is level with your thighs. ? Do not arch your back. ? You should feel the muscles working in your buttocks and the back of your thighs. If you do not feel these muscles, slide your feet 1-2 inches (2.5-5 cm) farther away from your buttocks. ? If this exercise is too easy, try doing it with your arms crossed over your chest. 3. Hold this position for __________ seconds. 4. Slowly lower your hips to the starting position. 5. Let your muscles relax completely after each repetition. Repeat __________ times. Complete this exercise __________ times a day.   Squats This exercise strengthens the muscles in front of your thigh and knee (quadriceps). 1. Stand in front of a table, with your feet and  knees pointing straight ahead. You may rest your hands on the table for balance but not for support. 2. Slowly bend your knees and lower your hips like you are going to sit in a chair. ? Keep your weight over your heels, not over your toes. ? Keep your lower legs upright so they are parallel with the table legs. ? Do not let your hips go lower than your knees. ? Do not bend lower than told by your health care provider. ? If your hip pain increases, do not bend as  low. 3. Hold the squat position for __________ seconds. 4. Slowly push with your legs to return to standing. Do not use your hands to pull yourself to standing. Repeat __________ times. Complete this exercise __________ times a day. Hip hike 1. Stand sideways on a bottom step. Stand on your left / right leg with your other foot unsupported next to the step. You can hold on to the railing or wall for balance if needed. 2. Keep your knees straight and your torso square. Then lift your left / right hip up toward the ceiling. 3. Hold this position for __________ seconds. 4. Slowly let your left / right hip lower toward the floor, past the starting position. Your foot should get closer to the floor. Do not lean or bend your knees. Repeat __________ times. Complete this exercise __________ times a day. Single leg stand 1. Without shoes, stand near a railing or in a doorway. You may hold on to the railing or door frame as needed for balance. 2. Squeeze your left / right buttock muscles, then lift up your other foot. ? Do not let your left / right hip push out to the side. ? It is helpful to stand in front of a mirror for this exercise so you can watch your hip. 3. Hold this position for __________ seconds. Repeat __________ times. Complete this exercise __________ times a day. This information is not intended to replace advice given to you by your health care provider. Make sure you discuss any questions you have with your health care provider. Document Revised: 05/01/2018 Document Reviewed: 05/01/2018 Elsevier Patient Education  [place].

## 2020-02-22 ENCOUNTER — Ambulatory Visit: Payer: Medicaid Other | Admitting: Specialist

## 2020-02-22 ENCOUNTER — Other Ambulatory Visit: Payer: Self-pay | Admitting: Specialist

## 2020-03-04 MED FILL — AMITRIPTYLINE HCL 25 MG TAB: 25 | 30 days supply | Qty: 30 | Fill #3

## 2020-03-04 MED FILL — ATORVASTATIN CALCIUM 20 MG: 20 | 30 days supply | Qty: 30 | Fill #1

## 2020-03-05 DIAGNOSIS — F331 Major depressive disorder, recurrent, moderate: Secondary | ICD-10-CM | POA: Diagnosis not present

## 2020-03-05 DIAGNOSIS — F4310 Post-traumatic stress disorder, unspecified: Secondary | ICD-10-CM | POA: Diagnosis not present

## 2020-03-12 MED FILL — OXYBUTYNIN 5 MG TABLET: 5 | 30 days supply | Qty: 60 | Fill #1

## 2020-03-17 MED FILL — DULoxetine HCL 60 MG CPEP: 60 | 30 days supply | Qty: 30 | Fill #3

## 2020-03-19 DIAGNOSIS — F4310 Post-traumatic stress disorder, unspecified: Secondary | ICD-10-CM | POA: Diagnosis not present

## 2020-03-19 DIAGNOSIS — F331 Major depressive disorder, recurrent, moderate: Secondary | ICD-10-CM | POA: Diagnosis not present

## 2020-03-25 MED FILL — CYCLOBENZAPRINE 10 MG TAB: 10 | 30 days supply | Qty: 60 | Fill #1

## 2020-03-25 MED FILL — NAPROXEN 500 MG TABLET: 500 | 30 days supply | Qty: 60 | Fill #1

## 2020-04-01 DIAGNOSIS — F331 Major depressive disorder, recurrent, moderate: Secondary | ICD-10-CM | POA: Diagnosis not present

## 2020-04-01 DIAGNOSIS — F4310 Post-traumatic stress disorder, unspecified: Secondary | ICD-10-CM | POA: Diagnosis not present

## 2020-04-14 MED FILL — DULoxetine HCL 60 MG CPEP: 60 | 30 days supply | Qty: 30 | Fill #4

## 2020-04-14 MED FILL — OXYBUTYNIN 5 MG TABLET: 5 | 30 days supply | Qty: 60 | Fill #2

## 2020-04-15 DIAGNOSIS — F331 Major depressive disorder, recurrent, moderate: Secondary | ICD-10-CM | POA: Diagnosis not present

## 2020-04-15 DIAGNOSIS — F4310 Post-traumatic stress disorder, unspecified: Secondary | ICD-10-CM | POA: Diagnosis not present

## 2020-04-19 ENCOUNTER — Other Ambulatory Visit: Payer: Self-pay

## 2020-04-23 ENCOUNTER — Other Ambulatory Visit: Payer: Self-pay

## 2020-04-23 MED FILL — Pregabalin Cap 50 MG: ORAL | 30 days supply | Qty: 30 | Fill #0 | Status: AC

## 2020-04-23 MED FILL — Pregabalin Cap 50 MG: ORAL | 30 days supply | Qty: 30 | Fill #0 | Status: CN

## 2020-04-24 ENCOUNTER — Other Ambulatory Visit: Payer: Self-pay

## 2020-04-28 ENCOUNTER — Ambulatory Visit: Payer: Medicaid Other | Admitting: Family Medicine

## 2020-05-05 ENCOUNTER — Other Ambulatory Visit: Payer: Self-pay

## 2020-05-05 MED FILL — Atorvastatin Calcium Tab 20 MG (Base Equivalent): ORAL | 30 days supply | Qty: 30 | Fill #0 | Status: AC

## 2020-05-05 MED FILL — Atorvastatin Calcium Tab 20 MG (Base Equivalent): ORAL | 30 days supply | Qty: 30 | Fill #0 | Status: CN

## 2020-05-06 ENCOUNTER — Other Ambulatory Visit: Payer: Self-pay

## 2020-05-11 MED FILL — Naproxen Tab 500 MG: ORAL | 30 days supply | Qty: 60 | Fill #0 | Status: AC

## 2020-05-11 MED FILL — Acetaminophen w/ Codeine Tab 300-30 MG: ORAL | 5 days supply | Qty: 15 | Fill #0 | Status: AC

## 2020-05-11 MED FILL — Amitriptyline HCl Tab 50 MG: ORAL | 30 days supply | Qty: 30 | Fill #0 | Status: AC

## 2020-05-11 MED FILL — Cyclobenzaprine HCl Tab 10 MG: ORAL | 30 days supply | Qty: 60 | Fill #0 | Status: AC

## 2020-05-12 ENCOUNTER — Other Ambulatory Visit: Payer: Self-pay

## 2020-05-12 MED FILL — Duloxetine HCl Enteric Coated Pellets Cap 60 MG (Base Eq): ORAL | 30 days supply | Qty: 30 | Fill #0 | Status: AC

## 2020-05-13 ENCOUNTER — Other Ambulatory Visit: Payer: Self-pay

## 2020-05-13 DIAGNOSIS — F331 Major depressive disorder, recurrent, moderate: Secondary | ICD-10-CM | POA: Diagnosis not present

## 2020-05-13 DIAGNOSIS — F4310 Post-traumatic stress disorder, unspecified: Secondary | ICD-10-CM | POA: Diagnosis not present

## 2020-05-20 MED FILL — Pregabalin Cap 50 MG: ORAL | 30 days supply | Qty: 30 | Fill #1 | Status: AC

## 2020-05-20 MED FILL — Oxybutynin Chloride Tab 5 MG: ORAL | 30 days supply | Qty: 60 | Fill #0 | Status: AC

## 2020-05-21 ENCOUNTER — Other Ambulatory Visit: Payer: Self-pay

## 2020-05-23 ENCOUNTER — Other Ambulatory Visit: Payer: Self-pay

## 2020-05-23 DIAGNOSIS — F331 Major depressive disorder, recurrent, moderate: Secondary | ICD-10-CM | POA: Diagnosis not present

## 2020-05-23 DIAGNOSIS — F4310 Post-traumatic stress disorder, unspecified: Secondary | ICD-10-CM | POA: Diagnosis not present

## 2020-05-27 ENCOUNTER — Telehealth: Payer: Self-pay

## 2020-05-27 ENCOUNTER — Encounter: Payer: Self-pay | Admitting: Family Medicine

## 2020-05-27 ENCOUNTER — Other Ambulatory Visit: Payer: Self-pay

## 2020-05-27 ENCOUNTER — Ambulatory Visit: Payer: Medicaid Other | Attending: Family Medicine | Admitting: Family Medicine

## 2020-05-27 ENCOUNTER — Telehealth: Payer: Self-pay | Admitting: Family Medicine

## 2020-05-27 VITALS — BP 100/65 | HR 75 | Ht 67.0 in | Wt 152.8 lb

## 2020-05-27 DIAGNOSIS — M5442 Lumbago with sciatica, left side: Secondary | ICD-10-CM | POA: Diagnosis not present

## 2020-05-27 DIAGNOSIS — F320 Major depressive disorder, single episode, mild: Secondary | ICD-10-CM

## 2020-05-27 DIAGNOSIS — M5441 Lumbago with sciatica, right side: Secondary | ICD-10-CM | POA: Diagnosis not present

## 2020-05-27 DIAGNOSIS — R0609 Other forms of dyspnea: Secondary | ICD-10-CM | POA: Diagnosis not present

## 2020-05-27 DIAGNOSIS — G8929 Other chronic pain: Secondary | ICD-10-CM

## 2020-05-27 MED ORDER — PREDNISONE 20 MG PO TABS
20.0000 mg | ORAL_TABLET | Freq: Every day | ORAL | 0 refills | Status: DC
  Filled 2020-05-27: qty 5, 5d supply, fill #0

## 2020-05-27 MED ORDER — PREGABALIN 75 MG PO CAPS
75.0000 mg | ORAL_CAPSULE | Freq: Two times a day (BID) | ORAL | 5 refills | Status: DC
  Filled 2020-05-27: qty 60, 30d supply, fill #0
  Filled 2020-08-27: qty 60, 30d supply, fill #1

## 2020-05-27 MED ORDER — ALBUTEROL SULFATE HFA 108 (90 BASE) MCG/ACT IN AERS
2.0000 | INHALATION_SPRAY | Freq: Four times a day (QID) | RESPIRATORY_TRACT | 1 refills | Status: DC | PRN
  Filled 2020-05-27: qty 8.5, 25d supply, fill #0

## 2020-05-27 NOTE — Progress Notes (Signed)
Pain in buttocks down legs

## 2020-05-27 NOTE — Telephone Encounter (Signed)
Following up on a request the patient's PCP regarding housing resources. Contacted the patient by phone 2x's, did LVM for the patient to return a phone call to [facility] to myself.

## 2020-05-27 NOTE — Telephone Encounter (Signed)
Reached out the patient this afternoon, no answer.  LVM for the patient to call the office for me. Also provided a list of resources as well.

## 2020-05-27 NOTE — Progress Notes (Signed)
Subjective:  Patient ID: Danell Vazquez, female    DOB: 03/31/1965  Age: 55 y.o. MRN: [id number]  CC: Back Pain   HPI Cristi Gwynn is a 55 year old female with Medical history significant for Prediabetes (diet controlled A1c 5.6) chronic low back pain with sciatica, GERD, hyperlipidemia,tobacco abuse here for a follow-up visit.  She continues to have chronic pain. Pain is her back and radiates to bilateral butt cheek. Avoiding surgery due to the fact that she has seen people with not so good outcomes.  Denies falls, loss of sphincteric function. Denied disability and she is appealing.  She is unable to work as prolonged standing worsens her symptoms.  Currently seeing Dr. Louanne Skye of orthopedics.  She received epidural spinal injection in the past by Dr. Ernestina Patches but complains she had persistent headache for the whole day after that and would not like a repeat epidural spinal injection.  Will be homeless at the end of the year as her Barbaraann Rondo is kicking her out and this has her depressed and she has been partaking in emotional eating. She is undergoing Psychotherapy due to this. Past Medical History:  Diagnosis Date  . Acid reflux   . Allergy   . Carpal tunnel syndrome   . Depression   . Diverticulitis   . Hyperlipidemia   . Low back pain   . Neck pain   . Sacroiliac inflammation ([place])   . Sciatica   . Seasonal allergies     Past Surgical History:  Procedure Laterality Date  . ABDOMINAL SURGERY    . ANKLE SURGERY     lt.  Marland Kitchen PARTIAL HYSTERECTOMY    . right breast cyst removed     . URETHRAL DIVERTICULUM REPAIR     uretrral diverticultis    Family History  Problem Relation Age of Onset  . Healthy Mother   . Other Father        Unsure of medical history  . Asthma Maternal Aunt   . Diabetes Maternal Aunt   . Cancer Maternal Aunt   . Breast cancer Maternal Aunt   . Hypertension Maternal Grandmother   . Colon cancer Neg Hx   . Pancreatic cancer Neg Hx   . Rectal  cancer Neg Hx   . Stomach cancer Neg Hx   . Colon polyps Neg Hx   . Esophageal cancer Neg Hx     Allergies  Allergen Reactions  . Percocet [Oxycodone-Acetaminophen] Nausea Only  . Vicodin [Hydrocodone-Acetaminophen] Nausea Only  . Penicillins Rash    Outpatient Medications Prior to Visit  Medication Sig Dispense Refill  . acetaminophen-codeine (TYLENOL #3) 300-30 MG tablet TAKE 1 TABLET BY MOUTH EVERY 8 (EIGHT) HOURS AS NEEDED FOR UP TO 7 DAYS FOR MODERATE PAIN. 15 tablet 0  . Acetaminophen-Codeine 300-30 MG tablet TAKE 1 TABLET BY MOUTH EVERY 8 (EIGHT) HOURS AS NEEDED FOR UP TO 7 DAYS FOR MODERATE PAIN. 15 tablet 0  . amitriptyline (ELAVIL) 50 MG tablet TAKE 1 TABLET (50 MG TOTAL) BY MOUTH AT BEDTIME. 30 tablet 6  . atorvastatin (LIPITOR) 20 MG tablet TAKE 1 TABLET (20 MG TOTAL) BY MOUTH DAILY. 30 tablet 6  . cetirizine (ZYRTEC) 10 MG tablet Take 1 tablet (10 mg total) by mouth daily. 90 tablet 1  . cyclobenzaprine (FLEXERIL) 10 MG tablet TAKE 1 TABLET (10 MG TOTAL) BY MOUTH 2 (TWO) TIMES DAILY AS NEEDED FOR MUSCLE SPASMS. 60 tablet 6  . diclofenac sodium (VOLTAREN) 1 % GEL Apply 2 g topically 4 (four) times  daily. 5 Tube 2  . DULoxetine (CYMBALTA) 60 MG capsule TAKE 1 CAPSULE (60 MG TOTAL) BY MOUTH DAILY. 30 capsule 6  . fluticasone (FLONASE) 50 MCG/ACT nasal spray PLACE 2 SPRAYS INTO BOTH NOSTRILS DAILY. 16 g 2  . Lactobacillus CAPS Take 1 capsule by mouth daily.    Marland Kitchen lidocaine (LIDODERM) 5 % PLACE 1 PATCH ONTO THE SKIN DAILY. REMOVE & DISCARD PATCH WITHIN 12 HOURS OR AS DIRECTED BY MD (Patient taking differently: 1 patch daily. Remove & discard patch within 12 hours or as directed by MD) 30 patch 6  . metroNIDAZOLE (METROGEL) 0.75 % vaginal gel PLACE 1 APPLICATORFUL VAGINALLY AT BEDTIME FOR 5 DAYS. 70 g 0  . Multiple Vitamin (MULTIVITAMIN WITH MINERALS) TABS tablet Take 1 tablet by mouth daily.    . naproxen (NAPROSYN) 500 MG tablet TAKE 1 TABLET BY MOUTH TWO TIMES DAILY WITH MEALS 60  tablet 3  . nicotine (NICODERM CQ - DOSED IN MG/24 HOURS) 21 mg/24hr patch PLACE 1 PATCH (21 MG TOTAL) ONTO THE SKIN DAILY. FOR 4 WEEKS THEN 7MG /24HR DAILY FOR 4 WEEKS 28 patch 2  . oxybutynin (DITROPAN) 5 MG tablet TAKE 1 TABLET (5 MG TOTAL) BY MOUTH 2 (TWO) TIMES DAILY. 60 tablet 6  . RESTASIS 0.05 % ophthalmic emulsion   3  . triamcinolone cream (KENALOG) 0.1 % Apply 1 application topically 2 (two) times daily. 45 g 1  . albuterol (VENTOLIN HFA) 108 (90 Base) MCG/ACT inhaler INHALE 2 PUFFS INTO THE LUNGS EVERY 6 (SIX) HOURS AS NEEDED FOR WHEEZING OR SHORTNESS OF BREATH. 8.5 g 1  . pregabalin (LYRICA) 50 MG capsule TAKE 1 CAPSULE (50 MG TOTAL) BY MOUTH AT BEDTIME AS NEEDED. 60 capsule 5  . acetaminophen-codeine (TYLENOL #3) 300-30 MG tablet TAKE 1 TABLET BY MOUTH EVERY 8 (EIGHT) HOURS AS NEEDED FOR UP TO 7 DAYS FOR MODERATE PAIN. 15 tablet 0  . nitrofurantoin, macrocrystal-monohydrate, (MACROBID) 100 MG capsule TAKE 1 CAPSULE (100 MG TOTAL) BY MOUTH 2 (TWO) TIMES DAILY. (Patient not taking: Reported on 05/27/2020) 10 capsule 0  . nitrofurantoin, macrocrystal-monohydrate, (MACROBID) 100 MG capsule TAKE 1 CAPSULE (100 MG TOTAL) BY MOUTH 2 (TWO) TIMES DAILY FOR 5 DAYS. (Patient not taking: Reported on 05/27/2020) 10 capsule 0  . phenazopyridine (PYRIDIUM) 200 MG tablet Take 1 tablet (200 mg total) by mouth 3 (three) times daily as needed for pain. (Patient not taking: No sig reported) 6 tablet 0   Facility-Administered Medications Prior to Visit  Medication Dose Route Frequency Provider Last Rate Last Admin  . 0.9 %  sodium chloride infusion  500 mL Intravenous Once Nelida Meuse III, MD         ROS Review of Systems  Constitutional: Negative for activity change, appetite change and fatigue.  HENT: Negative for congestion, sinus pressure and sore throat.   Eyes: Negative for visual disturbance.  Respiratory: Negative for cough, chest tightness, shortness of breath and wheezing.    Cardiovascular: Negative for chest pain and palpitations.  Gastrointestinal: Negative for abdominal distention, abdominal pain and constipation.  Endocrine: Negative for polydipsia.  Genitourinary: Negative for dysuria and frequency.  Musculoskeletal: Positive for back pain. Negative for arthralgias.  Skin: Negative for rash.  Neurological: Negative for tremors, light-headedness and numbness.  Hematological: Does not bruise/bleed easily.  Psychiatric/Behavioral: Negative for agitation and behavioral problems.    Objective:  BP 100/65   Pulse 75   Ht 5\' 7"  (1.702 m)   Wt 152 lb 12.8 oz (69.3 kg)  SpO2 96%   BMI 23.93 kg/m   BP/Weight 05/27/2020 02/21/2020 04/26/8117  Systolic BP 147 829 562  Diastolic BP 65 60 84  Wt. (Lbs) 152.8 149 -  BMI 23.93 23.34 -      Physical Exam Constitutional:      Appearance: She is well-developed.  Neck:     Vascular: No JVD.  Cardiovascular:     Rate and Rhythm: Normal rate.     Heart sounds: Normal heart sounds. No murmur heard.   Pulmonary:     Effort: Pulmonary effort is normal.     Breath sounds: Normal breath sounds. No wheezing or rales.  Chest:     Chest wall: No tenderness.  Abdominal:     General: Bowel sounds are normal. There is no distension.     Palpations: Abdomen is soft. There is no mass.     Tenderness: There is no abdominal tenderness.  Musculoskeletal:        General: Normal range of motion.     Right lower leg: No edema.     Left lower leg: No edema.     Comments: Tenderness to palpation of bilateral buttock Negative straight leg raise bilaterally  Neurological:     Mental Status: She is alert and oriented to person, place, and time.  Psychiatric:        Mood and Affect: Mood normal.     CMP Latest Ref Rng & Units 01/28/2020 07/04/2019 09/20/2018  Glucose 65 - 99 mg/dL 84 86 92  BUN 6 - 24 mg/dL 22 23 17   Creatinine 0.57 - 1.00 mg/dL 0.79 0.82 0.72  Sodium 134 - 144 mmol/L 138 139 141  Potassium 3.5 - 5.2  mmol/L 4.4 4.9 4.9  Chloride 96 - 106 mmol/L 102 101 102  CO2 20 - 29 mmol/L 23 26 27   Calcium 8.7 - 10.2 mg/dL 9.5 9.8 9.7  Total Protein 6.0 - 8.5 g/dL 6.9 7.3 6.7  Total Bilirubin 0.0 - 1.2 mg/dL 0.4 0.3 0.3  Alkaline Phos 44 - 121 IU/L 106 108 113  AST 0 - 40 IU/L 22 22 28   ALT 0 - 32 IU/L 16 12 27     Lipid Panel     Component Value Date/Time   CHOL 191 01/28/2020 1012   TRIG 74 01/28/2020 1012   HDL 60 01/28/2020 1012   CHOLHDL 3.2 01/28/2020 1012   CHOLHDL 3.2 12/26/2013 1319   VLDL 18 12/26/2013 1319   LDLCALC 117 (H) 01/28/2020 1012    CBC    Component Value Date/Time   WBC 8.6 04/18/2017 1003   WBC 9.3 04/13/2013 1152   RBC 4.74 04/18/2017 1003   RBC 4.94 04/13/2013 1152   HGB 14.9 04/18/2017 1003   HCT 44.7 04/18/2017 1003   PLT 236 04/18/2017 1003   MCV 94 04/18/2017 1003   MCH 31.4 04/18/2017 1003   MCH 32.2 04/13/2013 1152   MCHC 33.3 04/18/2017 1003   MCHC 34.6 04/13/2013 1152   RDW 14.5 04/18/2017 1003   LYMPHSABS 2.6 04/18/2017 1003   MONOABS 0.9 04/13/2013 1152   EOSABS 0.1 04/18/2017 1003   BASOSABS 0.0 04/18/2017 1003    Lab Results  Component Value Date   HGBA1C 5.6 (A) 07/04/2019    Assessment & Plan:  1. Chronic midline low back pain with bilateral sciatica Uncontrolled I have increased her dose of Lyrica Short course of prednisone added Referred to pain clinic Completed PT in the past which was ineffective Discussed home exercises, yoga, stretching -  Ambulatory referral to Pain Clinic - pregabalin (LYRICA) 75 MG capsule; Take 1 capsule (75 mg total) by mouth 2 (two) times daily.  Dispense: 60 capsule; Refill: 5 - predniSONE (DELTASONE) 20 MG tablet; Take 1 tablet (20 mg total) by mouth daily with breakfast.  Dispense: 5 tablet; Refill: 0  2. Other form of dyspnea She does use albuterol as needed - albuterol (VENTOLIN HFA) 108 (90 Base) MCG/ACT inhaler; INHALE 2 PUFFS INTO THE LUNGS EVERY 6 (SIX) HOURS AS NEEDED FOR WHEEZING OR  SHORTNESS OF BREATH.  Dispense: 8.5 g; Refill: 1  3. Current mild episode of major depressive disorder without prior episode ([place]) Uncontrolled due to psychosocial stressors Currently on Cymbalta Continue psychotherapy I will have the social worker reach out to her with housing resources.    Meds ordered this encounter  Medications  . albuterol (VENTOLIN HFA) 108 (90 Base) MCG/ACT inhaler    Sig: INHALE 2 PUFFS INTO THE LUNGS EVERY 6 (SIX) HOURS AS NEEDED FOR WHEEZING OR SHORTNESS OF BREATH.    Dispense:  8.5 g    Refill:  1  . pregabalin (LYRICA) 75 MG capsule    Sig: Take 1 capsule (75 mg total) by mouth 2 (two) times daily.    Dispense:  60 capsule    Refill:  5    Dose increase  . predniSONE (DELTASONE) 20 MG tablet    Sig: Take 1 tablet (20 mg total) by mouth daily with breakfast.    Dispense:  5 tablet    Refill:  0    Follow-up: Return in about 3 months (around 08/27/2020).       Charlott Rakes, MD, FAAFP. [facility] [place], [place]   05/27/2020, 5:13 PM

## 2020-05-27 NOTE — Telephone Encounter (Signed)
Connected with the patient. Call completed. Thank you

## 2020-05-27 NOTE — Telephone Encounter (Signed)
Can you please reach out to this patient for assistance with housing resources. Thanks.

## 2020-05-27 NOTE — Telephone Encounter (Signed)
Returning the patients phone call from earlier today. Verified patients name and date of birth. CM shared with the patient information on community resources sent to [place]. Patient shared they received the information and will look into them. Patient explained they are currently living in a home owned by family and the home will be sold soon. Patient has applied for disability and been denied several times. Patient mentioned being on the waiting list for [place] [facility] (recently called for an update on the place on lists; no information given). Patient expressed appreciation for information shared. CM encouraged patient to contact [facility] for any further assistance.

## 2020-05-27 NOTE — Telephone Encounter (Signed)
Pt is returning monica call

## 2020-05-29 DIAGNOSIS — F4310 Post-traumatic stress disorder, unspecified: Secondary | ICD-10-CM | POA: Diagnosis not present

## 2020-05-29 DIAGNOSIS — F331 Major depressive disorder, recurrent, moderate: Secondary | ICD-10-CM | POA: Diagnosis not present

## 2020-06-04 ENCOUNTER — Other Ambulatory Visit: Payer: Self-pay

## 2020-06-04 MED FILL — Atorvastatin Calcium Tab 20 MG (Base Equivalent): ORAL | 30 days supply | Qty: 30 | Fill #1 | Status: AC

## 2020-06-05 ENCOUNTER — Other Ambulatory Visit: Payer: Self-pay

## 2020-06-05 DIAGNOSIS — M129 Arthropathy, unspecified: Secondary | ICD-10-CM | POA: Diagnosis not present

## 2020-06-05 DIAGNOSIS — Z79899 Other long term (current) drug therapy: Secondary | ICD-10-CM | POA: Diagnosis not present

## 2020-06-05 DIAGNOSIS — Z1159 Encounter for screening for other viral diseases: Secondary | ICD-10-CM | POA: Diagnosis not present

## 2020-06-05 DIAGNOSIS — E559 Vitamin D deficiency, unspecified: Secondary | ICD-10-CM | POA: Diagnosis not present

## 2020-06-09 ENCOUNTER — Other Ambulatory Visit: Payer: Self-pay

## 2020-06-09 MED FILL — Fluticasone Propionate Nasal Susp 50 MCG/ACT: NASAL | 30 days supply | Qty: 16 | Fill #0 | Status: AC

## 2020-06-09 MED FILL — Duloxetine HCl Enteric Coated Pellets Cap 60 MG (Base Eq): ORAL | 30 days supply | Qty: 30 | Fill #1 | Status: AC

## 2020-06-10 DIAGNOSIS — F4310 Post-traumatic stress disorder, unspecified: Secondary | ICD-10-CM | POA: Diagnosis not present

## 2020-06-10 DIAGNOSIS — F331 Major depressive disorder, recurrent, moderate: Secondary | ICD-10-CM | POA: Diagnosis not present

## 2020-06-11 ENCOUNTER — Other Ambulatory Visit: Payer: Self-pay

## 2020-06-23 ENCOUNTER — Other Ambulatory Visit: Payer: Self-pay

## 2020-06-23 ENCOUNTER — Other Ambulatory Visit: Payer: Self-pay | Admitting: Internal Medicine

## 2020-06-23 MED FILL — Naproxen Tab 500 MG: ORAL | 30 days supply | Qty: 60 | Fill #1 | Status: AC

## 2020-06-23 MED FILL — Cyclobenzaprine HCl Tab 10 MG: ORAL | 30 days supply | Qty: 60 | Fill #1 | Status: AC

## 2020-06-23 MED FILL — Amitriptyline HCl Tab 50 MG: ORAL | 30 days supply | Qty: 30 | Fill #1 | Status: AC

## 2020-06-23 MED FILL — Oxybutynin Chloride Tab 5 MG: ORAL | 30 days supply | Qty: 60 | Fill #1 | Status: AC

## 2020-06-24 ENCOUNTER — Other Ambulatory Visit: Payer: Self-pay

## 2020-06-28 DIAGNOSIS — F331 Major depressive disorder, recurrent, moderate: Secondary | ICD-10-CM | POA: Diagnosis not present

## 2020-06-28 DIAGNOSIS — F4310 Post-traumatic stress disorder, unspecified: Secondary | ICD-10-CM | POA: Diagnosis not present

## 2020-07-02 ENCOUNTER — Other Ambulatory Visit: Payer: Self-pay

## 2020-07-02 ENCOUNTER — Other Ambulatory Visit: Payer: Self-pay | Admitting: Family Medicine

## 2020-07-02 DIAGNOSIS — G8929 Other chronic pain: Secondary | ICD-10-CM

## 2020-07-02 DIAGNOSIS — M5442 Lumbago with sciatica, left side: Secondary | ICD-10-CM

## 2020-07-02 MED FILL — Atorvastatin Calcium Tab 20 MG (Base Equivalent): ORAL | 30 days supply | Qty: 30 | Fill #2 | Status: AC

## 2020-07-02 NOTE — Telephone Encounter (Signed)
Requested medication (s) are due for refill today: no  Requested medication (s) are on the active medication list: yes   Last refill: 05/27/2020  Future visit scheduled: yes   Notes to clinic: this refill cannot be delegated    Requested Prescriptions  Pending Prescriptions Disp Refills   predniSONE (DELTASONE) 20 MG tablet 5 tablet 0    Sig: Take 1 tablet (20 mg total) by mouth daily with breakfast.      Not Delegated - Endocrinology:  Oral Corticosteroids Failed - 07/02/2020  9:19 AM      Failed - This refill cannot be delegated      Passed - Last BP in normal range    BP Readings from Last 1 Encounters:  05/27/20 100/65          Passed - Valid encounter within last 6 months    Recent Outpatient Visits           1 month ago Current mild episode of major depressive disorder without prior episode ([place])   [place], Charlane Ferretti, MD   5 months ago Tobacco abuse   [place], [place], MD   8 months ago Chronic midline low back pain with bilateral sciatica   [place], [place], MD   12 months ago Prediabetes   [place], [place], MD   1 year ago Tobacco abuse   [place], Enobong, MD       Future Appointments             In 1 month Louanne Skye, Daleen Bo, MD [place]   In 1 month Charlott Rakes, MD [place]

## 2020-07-04 ENCOUNTER — Other Ambulatory Visit: Payer: Self-pay

## 2020-07-07 ENCOUNTER — Other Ambulatory Visit: Payer: Self-pay

## 2020-07-08 ENCOUNTER — Encounter (HOSPITAL_COMMUNITY): Payer: Self-pay

## 2020-07-08 ENCOUNTER — Other Ambulatory Visit: Payer: Self-pay

## 2020-07-08 ENCOUNTER — Ambulatory Visit (HOSPITAL_COMMUNITY)
Admission: EM | Admit: 2020-07-08 | Discharge: 2020-07-08 | Disposition: A | Discharge status: Home / Self Care | Payer: Medicaid Other | Attending: Student | Admitting: Student

## 2020-07-08 DIAGNOSIS — N3000 Acute cystitis without hematuria: Secondary | ICD-10-CM | POA: Insufficient documentation

## 2020-07-08 LAB — POCT URINALYSIS DIPSTICK, ED / UC
Bilirubin Urine: NEGATIVE
Glucose, UA: NEGATIVE mg/dL
Hgb urine dipstick: NEGATIVE
Nitrite: POSITIVE — AB
Protein, ur: NEGATIVE mg/dL
Specific Gravity, Urine: >=1.03 (ref 1.005–1.030)
Urobilinogen, UA: 0.2 mg/dL (ref 0.0–1.0)
pH: 6 (ref 5.0–8.0)

## 2020-07-08 MED ORDER — NITROFURANTOIN MONOHYD MACRO 100 MG PO CAPS: 100.0000 mg | ORAL_CAPSULE | Freq: Two times a day (BID) | ORAL | 0 refills | Status: DC

## 2020-07-08 NOTE — ED Provider Notes (Signed)
[facility]    CSN: [id number] Arrival date & time: 07/08/20  1824      History   Chief Complaint Chief Complaint  Patient presents with   Dysuria   Abdominal Pain    HPI Cheryl King is a 55 y.o. female presenting with burning with urination and suprapubic pressure for 1 week, getting worse.  Medical history GERD, diverticulitis, hyperlipidemia, sciatica, UTIs, BV. Denies hematuria, frequency, urgency, back pain, n/v/d, fevers/chills, abdnormal vaginal discharge, vaginal rashes/lesions, new partners.    HPI  Past Medical History:  Diagnosis Date   Acid reflux    Allergy    Carpal tunnel syndrome    Depression    Diverticulitis    Hyperlipidemia    Low back pain    Neck pain    Sacroiliac inflammation ([place])    Sciatica    Seasonal allergies     Patient Active Problem List   Diagnosis Date Noted   Hyperlipidemia 07/18/2017   Ulnar neuropathy 04/18/2017   GERD (gastroesophageal reflux disease) 10/19/2016   Prediabetes 05/03/2016   Cubital tunnel syndrome on right 03/01/2016   Numbness and tingling of right arm 01/01/2016   Chlamydia infection 01/27/2015   Smoking 12/26/2013   Chronic lower back pain 04/13/2013   Sciatica 04/13/2013   Chronic neck pain 04/13/2013   Spondylosis of cervical spine 04/13/2013    Past Surgical History:  Procedure Laterality Date   ABDOMINAL SURGERY     ANKLE SURGERY     lt.   PARTIAL HYSTERECTOMY     right breast cyst removed      URETHRAL DIVERTICULUM REPAIR     uretrral diverticultis    OB History     Gravida  1   Para      Term      Preterm      AB  1   Living         SAB  1   IAB      Ectopic      Multiple      Live Births               Home Medications    Prior to Admission medications   Medication Sig Start Date End Date Taking? Authorizing Provider  Acetaminophen-Codeine 300-30 MG tablet TAKE 1 TABLET BY MOUTH EVERY 8 (EIGHT) HOURS AS NEEDED FOR UP TO 7 DAYS FOR  MODERATE PAIN. 02/22/20 08/22/20 Yes Jessy Oto, MD  albuterol (VENTOLIN HFA) 108 (90 Base) MCG/ACT inhaler INHALE 2 PUFFS INTO THE LUNGS EVERY 6 (SIX) HOURS AS NEEDED FOR WHEEZING OR SHORTNESS OF BREATH. 05/27/20 05/27/21 Yes Newlin, Enobong, MD  amitriptyline (ELAVIL) 50 MG tablet TAKE 1 TABLET (50 MG TOTAL) BY MOUTH AT BEDTIME. 01/28/20 01/27/21 Yes Newlin, Enobong, MD  atorvastatin (LIPITOR) 20 MG tablet TAKE 1 TABLET (20 MG TOTAL) BY MOUTH DAILY. 01/28/20 01/27/21 Yes Charlott Rakes, MD  cetirizine (ZYRTEC) 10 MG tablet Take 1 tablet (10 mg total) by mouth daily. 04/18/17  Yes Newlin, Charlane Ferretti, MD  cyclobenzaprine (FLEXERIL) 10 MG tablet TAKE 1 TABLET (10 MG TOTAL) BY MOUTH 2 (TWO) TIMES DAILY AS NEEDED FOR MUSCLE SPASMS. 01/28/20 01/27/21 Yes Charlott Rakes, MD  diclofenac sodium (VOLTAREN) 1 % GEL Apply 2 g topically 4 (four) times daily. 03/18/17  Yes Jessy Oto, MD  DULoxetine (CYMBALTA) 60 MG capsule TAKE 1 CAPSULE (60 MG TOTAL) BY MOUTH DAILY. 01/28/20 01/27/21 Yes Newlin, Charlane Ferretti, MD  fluticasone (FLONASE) 50 MCG/ACT nasal spray PLACE 2 SPRAYS INTO  BOTH NOSTRILS DAILY. 02/19/20 02/18/21 Yes Charlott Rakes, MD  Lactobacillus CAPS Take 1 capsule by mouth daily.   Yes [provider]  lidocaine (LIDODERM) 5 % PLACE 1 PATCH ONTO THE SKIN DAILY. REMOVE & DISCARD PATCH WITHIN 12 HOURS OR AS DIRECTED BY MD 01/28/20 01/27/21 Yes Charlott Rakes, MD  Multiple Vitamin (MULTIVITAMIN WITH MINERALS) TABS tablet Take 1 tablet by mouth daily.   Yes [provider]  naproxen (NAPROSYN) 500 MG tablet TAKE 1 TABLET BY MOUTH TWO TIMES DAILY WITH MEALS 02/18/20 02/17/21 Yes Jessy Oto, MD  nitrofurantoin, macrocrystal-monohydrate, (MACROBID) 100 MG capsule Take 1 capsule (100 mg total) by mouth 2 (two) times daily. 07/08/20  Yes Hazel Sams, PA-C  oxybutynin (DITROPAN) 5 MG tablet TAKE 1 TABLET (5 MG TOTAL) BY MOUTH 2 (TWO) TIMES DAILY. 01/28/20 01/27/21 Yes Charlott Rakes, MD  pregabalin (LYRICA) 75  MG capsule Take 1 capsule (75 mg total) by mouth 2 (two) times daily. 05/27/20 11/25/20 Yes Charlott Rakes, MD  RESTASIS 0.05 % ophthalmic emulsion  04/13/17  Yes [provider]  triamcinolone cream (KENALOG) 0.1 % Apply 1 application topically 2 (two) times daily. 01/03/19  Yes Newlin, Enobong, MD  acetaminophen-codeine (TYLENOL #3) 300-30 MG tablet TAKE 1 TABLET BY MOUTH EVERY 8 (EIGHT) HOURS AS NEEDED FOR UP TO 7 DAYS FOR MODERATE PAIN. 02/22/20   Jessy Oto, MD  nicotine (NICODERM CQ - DOSED IN MG/24 HOURS) 21 mg/24hr patch PLACE 1 PATCH (21 MG TOTAL) ONTO THE SKIN DAILY. FOR 4 WEEKS THEN 7MG /24HR DAILY FOR 4 WEEKS 10/23/19 10/22/20  Charlott Rakes, MD  phenazopyridine (PYRIDIUM) 200 MG tablet Take 1 tablet (200 mg total) by mouth 3 (three) times daily as needed for pain. Patient not taking: No sig reported 11/16/19   Zigmund Gottron, NP    Family History Family History  Problem Relation Age of Onset   Healthy Mother    Other Father        Unsure of medical history   Hypertension Maternal Grandmother    Heart Problems Maternal Grandmother    Asthma Maternal Aunt    Diabetes Maternal Aunt    Cancer Maternal Aunt    Breast cancer Maternal Aunt    Colon cancer Neg Hx    Pancreatic cancer Neg Hx    Rectal cancer Neg Hx    Stomach cancer Neg Hx    Colon polyps Neg Hx    Esophageal cancer Neg Hx     Social History Social History   Tobacco Use   Smoking status: Every Day    Packs/day: 0.50    Years: 35.00    Pack years: 17.50    Types: Cigarettes   Smokeless tobacco: Never  Vaping Use   Vaping Use: Never used  Substance Use Topics   Alcohol use: Yes    Comment: Social only - seldom   Drug use: No     Allergies   Latex, Percocet [oxycodone-acetaminophen], Vicodin [hydrocodone-acetaminophen], and Penicillins   Review of Systems Review of Systems  Constitutional:  Negative for appetite change, chills, diaphoresis and fever.  Respiratory:  Negative for  shortness of breath.   Cardiovascular:  Negative for chest pain.  Gastrointestinal:  Positive for abdominal pain. Negative for blood in stool, constipation, diarrhea, nausea and vomiting.  Genitourinary:  Positive for dysuria. Negative for decreased urine volume, difficulty urinating, flank pain, frequency, genital sores, hematuria and urgency.  Musculoskeletal:  Negative for back pain.  Neurological:  Negative for dizziness, weakness and  light-headedness.  All other systems reviewed and are negative.   Physical Exam Triage Vital Signs ED Triage Vitals  Enc Vitals Group     BP 07/08/20 1911 114/70     Pulse Rate 07/08/20 1911 76     Resp 07/08/20 1911 18     Temp 07/08/20 1911 99.2 F (37.3 C)     Temp src --      SpO2 07/08/20 1911 92 %     Weight --      Height --      Head Circumference --      Peak Flow --      Pain Score 07/08/20 1905 0     Pain Loc --      Pain Edu? --      Excl. in [place]? --    No data found.  Updated Vital Signs BP 114/70   Pulse 76   Temp 99.2 F (37.3 C)   Resp 18   SpO2 92%   Visual Acuity Right Eye Distance:   Left Eye Distance:   Bilateral Distance:    Right Eye Near:   Left Eye Near:    Bilateral Near:     Physical Exam Vitals reviewed.  Constitutional:      General: She is not in acute distress.    Appearance: Normal appearance. She is not ill-appearing.  HENT:     Head: Normocephalic and atraumatic.     Mouth/Throat:     Mouth: Mucous membranes are moist.     Comments: Moist mucous membranes Eyes:     Extraocular Movements: Extraocular movements intact.     Pupils: Pupils are equal, round, and reactive to light.  Cardiovascular:     Rate and Rhythm: Normal rate and regular rhythm.     Heart sounds: Normal heart sounds.  Pulmonary:     Effort: Pulmonary effort is normal.     Breath sounds: Normal breath sounds. No wheezing, rhonchi or rales.  Abdominal:     General: Bowel sounds are normal. There is no distension.      Palpations: Abdomen is soft. There is no mass.     Tenderness: There is abdominal tenderness in the suprapubic area. There is no right CVA tenderness, left CVA tenderness, guarding or rebound. Negative signs include Murphy's sign, Rovsing's sign and McBurney's sign.     Comments: No CVAT  Skin:    General: Skin is warm.     Capillary Refill: Capillary refill takes less than 2 seconds.     Comments: Good skin turgor  Neurological:     General: No focal deficit present.     Mental Status: She is alert and oriented to person, place, and time.  Psychiatric:        Mood and Affect: Mood normal.        Behavior: Behavior normal.     UC Treatments / Results  Labs (all labs ordered are listed, but only abnormal results are displayed) Labs Reviewed  POCT URINALYSIS DIPSTICK, ED / UC - Abnormal; Notable for the following components:      Result Value   Ketones, ur TRACE (*)    Nitrite POSITIVE (*)    Leukocytes,Ua SMALL (*)    All other components within normal limits  URINE CULTURE    EKG   Radiology No results found.  Procedures Procedures (including critical care time)  Medications Ordered in UC Medications - No data to display  Initial Impression / Assessment and Plan / UC Course  I have reviewed the triage vital signs and the nursing notes.  Pertinent labs & imaging results that were available during my care of the patient were reviewed by me and considered in my medical decision making (see chart for details).     This patient is a 55 year old female presenting with acute cystitis with hematuria. No CVAT, afebrile and nontachycardic.  UA with positive nitrite, small leuk, negative blood.  Culture sent. Pt with multiple antibiotic allergies. Will treat with macrobid pending culture. Good hydration.  ED return precautions discussed. Patient verbalizes understanding and agreement.    Final Clinical Impressions(s) / UC Diagnoses   Final diagnoses:  Acute cystitis  without hematuria     Discharge Instructions      -Macrobid twice daily for 5 days. -Make sure to drink plenty of fluids -We will call you if the urine culture shows that we need to change the antibiotic -Seek additional medical attention if symptoms get worse instead of better despite treatment, like worsening abdominal pain, new back pain, new fever/chills, vaginal discharge.     ED Prescriptions     Medication Sig Dispense Auth. Provider   nitrofurantoin, macrocrystal-monohydrate, (MACROBID) 100 MG capsule Take 1 capsule (100 mg total) by mouth 2 (two) times daily. 10 capsule Hazel Sams, PA-C      PDMP not reviewed this encounter.   Hazel Sams, PA-C 07/08/20 1951

## 2020-07-08 NOTE — Discharge Instructions (Addendum)
-  Macrobid twice daily for 5 days. -Make sure to drink plenty of fluids -We will call you if the urine culture shows that we need to change the antibiotic -Seek additional medical attention if symptoms get worse instead of better despite treatment, like worsening abdominal pain, new back pain, new fever/chills, vaginal discharge.

## 2020-07-08 NOTE — ED Triage Notes (Signed)
Pt reports burning with urination since last week. Also reports mild abdominal pain.

## 2020-07-11 DIAGNOSIS — F331 Major depressive disorder, recurrent, moderate: Secondary | ICD-10-CM | POA: Diagnosis not present

## 2020-07-11 DIAGNOSIS — F4310 Post-traumatic stress disorder, unspecified: Secondary | ICD-10-CM | POA: Diagnosis not present

## 2020-07-11 LAB — URINE CULTURE: Culture: >=100000 — AB

## 2020-07-13 ENCOUNTER — Encounter (HOSPITAL_COMMUNITY): Payer: Self-pay

## 2020-07-13 ENCOUNTER — Other Ambulatory Visit: Payer: Self-pay

## 2020-07-13 ENCOUNTER — Telehealth: Payer: Medicaid Other | Admitting: Emergency Medicine

## 2020-07-13 ENCOUNTER — Ambulatory Visit (HOSPITAL_COMMUNITY)
Admission: EM | Admit: 2020-07-13 | Discharge: 2020-07-13 | Disposition: A | Discharge status: Home / Self Care | Payer: Medicaid Other | Attending: Emergency Medicine | Admitting: Emergency Medicine

## 2020-07-13 DIAGNOSIS — H5713 Ocular pain, bilateral: Secondary | ICD-10-CM

## 2020-07-13 DIAGNOSIS — S0501XA Injury of conjunctiva and corneal abrasion without foreign body, right eye, initial encounter: Secondary | ICD-10-CM | POA: Diagnosis not present

## 2020-07-13 DIAGNOSIS — S0502XA Injury of conjunctiva and corneal abrasion without foreign body, left eye, initial encounter: Secondary | ICD-10-CM | POA: Diagnosis not present

## 2020-07-13 MED ORDER — POLYMYXIN B-TRIMETHOPRIM 10000-0.1 UNIT/ML-% OP SOLN: 1.0000 [drp] | OPHTHALMIC | 0 refills | Status: DC

## 2020-07-13 MED ORDER — DICLOFENAC SODIUM 0.1 % OP SOLN: 1.0000 [drp] | Freq: Four times a day (QID) | OPHTHALMIC | 0 refills | Status: DC

## 2020-07-13 MED ORDER — FLUORESCEIN SODIUM 1 MG OP STRP
ORAL_STRIP | OPHTHALMIC | Status: AC
  Filled 2020-07-13: qty 1

## 2020-07-13 NOTE — Progress Notes (Signed)
Based on what you shared with me, severe eye pain in both eyes after using drops, I feel your condition warrants further evaluation and I recommend that you be seen in a face to face visit for a thorough eye exam to make sure you do not have burns or scratches to your eyes.   NOTE: There will be NO CHARGE for this eVisit   If you are having a true medical emergency please call 911.      For an urgent face to face visit, [facility] has six urgent care centers for your convenience:     [facility] [name] [address] Get Driving Directions [phone] [place], [place] [address]    [facility]) Get Driving Directions [phone] [place], [place] [address]  [facility] [place] ([place]) Get Driving Directions [phone] [address] [place],  [place]  [address]  [facility] Get Driving Directions [phone] [place], [place], [place] [address]   [facility] Get Driving Directions  [phone] [address], [place] [address]   [facility] [place] Get Driving Directions [phone] [address] [place], [place] [address]  Your MyChart E-visit questionnaire answers were reviewed by a board certified advanced clinical practitioner to complete your personal care plan based on your specific symptoms.  Thank you for using e-Visits.   Approximately 5 minutes was spent documenting and reviewing patient's chart.

## 2020-07-13 NOTE — ED Provider Notes (Signed)
[place]    CSN: [id number] Arrival date & time: 07/13/20  1056      History   Chief Complaint Chief Complaint  Patient presents with   Conjunctivitis    HPI Cheryl King is a 55 y.o. female.   Patient here for bilateral eye redness, pain, and swelling that started yesterday.  Reports using an eyedrop prior to putting in contacts and then developed severe pain.  Reports removing contacts but eyes have continued to become itchy, watery, and red.  Reports using eyedrops again last night and had significant burning.  Reports having some clear watery discharge from bilateral eyes.  Patient does have an eye doctor.  Denies any photophobia or changes to vision.  Denies any specific alleviating or aggravating factors.  Denies any fevers, chest pain, shortness of breath, N/V/D, numbness, tingling, weakness, abdominal pain, or headaches.     The history is provided by the patient.  Conjunctivitis   Past Medical History:  Diagnosis Date   Acid reflux    Allergy    Carpal tunnel syndrome    Depression    Diverticulitis    Hyperlipidemia    Low back pain    Neck pain    Sacroiliac inflammation ([place])    Sciatica    Seasonal allergies     Patient Active Problem List   Diagnosis Date Noted   Hyperlipidemia 07/18/2017   Ulnar neuropathy 04/18/2017   GERD (gastroesophageal reflux disease) 10/19/2016   Prediabetes 05/03/2016   Cubital tunnel syndrome on right 03/01/2016   Numbness and tingling of right arm 01/01/2016   Chlamydia infection 01/27/2015   Smoking 12/26/2013   Chronic lower back pain 04/13/2013   Sciatica 04/13/2013   Chronic neck pain 04/13/2013   Spondylosis of cervical spine 04/13/2013    Past Surgical History:  Procedure Laterality Date   ABDOMINAL SURGERY     ANKLE SURGERY     lt.   PARTIAL HYSTERECTOMY     right breast cyst removed      URETHRAL DIVERTICULUM REPAIR     uretrral diverticultis    OB History     Gravida  1   Para       Term      Preterm      AB  1   Living         SAB  1   IAB      Ectopic      Multiple      Live Births               Home Medications    Prior to Admission medications   Medication Sig Start Date End Date Taking? Authorizing Provider  diclofenac (VOLTAREN) 0.1 % ophthalmic solution Place 1 drop into both eyes 4 (four) times daily. 07/13/20  Yes Pearson Forster, NP  trimethoprim-polymyxin b (POLYTRIM) ophthalmic solution Place 1 drop into both eyes every 4 (four) hours. 07/13/20  Yes Pearson Forster, NP  acetaminophen-codeine (TYLENOL #3) 300-30 MG tablet TAKE 1 TABLET BY MOUTH EVERY 8 (EIGHT) HOURS AS NEEDED FOR UP TO 7 DAYS FOR MODERATE PAIN. 02/22/20   Jessy Oto, MD  Acetaminophen-Codeine 300-30 MG tablet TAKE 1 TABLET BY MOUTH EVERY 8 (EIGHT) HOURS AS NEEDED FOR UP TO 7 DAYS FOR MODERATE PAIN. 02/22/20 08/22/20  Jessy Oto, MD  albuterol (VENTOLIN HFA) 108 (90 Base) MCG/ACT inhaler INHALE 2 PUFFS INTO THE LUNGS EVERY 6 (SIX) HOURS AS NEEDED FOR WHEEZING OR SHORTNESS OF BREATH.  05/27/20 05/27/21  Charlott Rakes, MD  amitriptyline (ELAVIL) 50 MG tablet TAKE 1 TABLET (50 MG TOTAL) BY MOUTH AT BEDTIME. 01/28/20 01/27/21  Charlott Rakes, MD  atorvastatin (LIPITOR) 20 MG tablet TAKE 1 TABLET (20 MG TOTAL) BY MOUTH DAILY. 01/28/20 01/27/21  Charlott Rakes, MD  cetirizine (ZYRTEC) 10 MG tablet Take 1 tablet (10 mg total) by mouth daily. 04/18/17   Charlott Rakes, MD  cyclobenzaprine (FLEXERIL) 10 MG tablet TAKE 1 TABLET (10 MG TOTAL) BY MOUTH 2 (TWO) TIMES DAILY AS NEEDED FOR MUSCLE SPASMS. 01/28/20 01/27/21  Charlott Rakes, MD  diclofenac sodium (VOLTAREN) 1 % GEL Apply 2 g topically 4 (four) times daily. 03/18/17   Jessy Oto, MD  DULoxetine (CYMBALTA) 60 MG capsule TAKE 1 CAPSULE (60 MG TOTAL) BY MOUTH DAILY. 01/28/20 01/27/21  Charlott Rakes, MD  fluticasone (FLONASE) 50 MCG/ACT nasal spray PLACE 2 SPRAYS INTO BOTH NOSTRILS DAILY. 02/19/20 02/18/21  Charlott Rakes, MD   Lactobacillus CAPS Take 1 capsule by mouth daily.    [provider]  lidocaine (LIDODERM) 5 % PLACE 1 PATCH ONTO THE SKIN DAILY. REMOVE & DISCARD PATCH WITHIN 12 HOURS OR AS DIRECTED BY MD 01/28/20 01/27/21  Charlott Rakes, MD  Multiple Vitamin (MULTIVITAMIN WITH MINERALS) TABS tablet Take 1 tablet by mouth daily.    [provider]  naproxen (NAPROSYN) 500 MG tablet TAKE 1 TABLET BY MOUTH TWO TIMES DAILY WITH MEALS 02/18/20 02/17/21  Jessy Oto, MD  nicotine (NICODERM CQ - DOSED IN MG/24 HOURS) 21 mg/24hr patch PLACE 1 PATCH (21 MG TOTAL) ONTO THE SKIN DAILY. FOR 4 WEEKS THEN 7MG /24HR DAILY FOR 4 WEEKS 10/23/19 10/22/20  Charlott Rakes, MD  nitrofurantoin, macrocrystal-monohydrate, (MACROBID) 100 MG capsule Take 1 capsule (100 mg total) by mouth 2 (two) times daily. 07/08/20   Hazel Sams, PA-C  oxybutynin (DITROPAN) 5 MG tablet TAKE 1 TABLET (5 MG TOTAL) BY MOUTH 2 (TWO) TIMES DAILY. 01/28/20 01/27/21  Charlott Rakes, MD  phenazopyridine (PYRIDIUM) 200 MG tablet Take 1 tablet (200 mg total) by mouth 3 (three) times daily as needed for pain. Patient not taking: No sig reported 11/16/19   Augusto Gamble B, NP  pregabalin (LYRICA) 75 MG capsule Take 1 capsule (75 mg total) by mouth 2 (two) times daily. 05/27/20 11/25/20  Charlott Rakes, MD  RESTASIS 0.05 % ophthalmic emulsion  04/13/17   [provider]  triamcinolone cream (KENALOG) 0.1 % Apply 1 application topically 2 (two) times daily. 01/03/19   Charlott Rakes, MD    Family History Family History  Problem Relation Age of Onset   Healthy Mother    Other Father        Unsure of medical history   Hypertension Maternal Grandmother    Heart Problems Maternal Grandmother    Asthma Maternal Aunt    Diabetes Maternal Aunt    Cancer Maternal Aunt    Breast cancer Maternal Aunt    Colon cancer Neg Hx    Pancreatic cancer Neg Hx    Rectal cancer Neg Hx    Stomach cancer Neg Hx    Colon polyps Neg Hx     Esophageal cancer Neg Hx     Social History Social History   Tobacco Use   Smoking status: Every Day    Packs/day: 0.50    Years: 35.00    Pack years: 17.50    Types: Cigarettes   Smokeless tobacco: Never  Vaping Use   Vaping Use: Never used  Substance Use Topics  Alcohol use: Yes    Comment: Social only - seldom   Drug use: No     Allergies   Latex, Percocet [oxycodone-acetaminophen], Vicodin [hydrocodone-acetaminophen], and Penicillins   Review of Systems Review of Systems  Eyes:  Positive for pain, discharge and redness. Negative for photophobia and visual disturbance.    Physical Exam Triage Vital Signs ED Triage Vitals  Enc Vitals Group     BP 07/13/20 1201 100/80     Pulse Rate 07/13/20 1201 81     Resp 07/13/20 1201 16     Temp 07/13/20 1201 98.9 F (37.2 C)     Temp Source 07/13/20 1201 Oral     SpO2 07/13/20 1201 95 %     Weight --      Height --      Head Circumference --      Peak Flow --      Pain Score 07/13/20 1202 10     Pain Loc --      Pain Edu? --      Excl. in [place]? --    No data found.  Updated Vital Signs BP 100/80 (BP Location: Right Arm)   Pulse 81   Temp 98.9 F (37.2 C) (Oral)   Resp 16   SpO2 95%   Visual Acuity Right Eye Distance: 20/25 (With correction ) Left Eye Distance: 20/30 (With correction ) Bilateral Distance: 20/25 (With correction )  Right Eye Near:   Left Eye Near:    Bilateral Near:     Physical Exam Vitals and nursing note reviewed.  Constitutional:      General: She is not in acute distress.    Appearance: Normal appearance. She is not ill-appearing, toxic-appearing or diaphoretic.  HENT:     Head: Normocephalic and atraumatic.  Eyes:     General: Lids are everted, no foreign bodies appreciated. No visual field deficit.       Right eye: No discharge or hordeolum.        Left eye: No discharge or hordeolum.     Extraocular Movements: Extraocular movements intact.     Conjunctiva/sclera:     Right  eye: Right conjunctiva is injected.     Left eye: Left conjunctiva is injected.     Pupils: Pupils are equal, round, and reactive to light.     Right eye: Fluorescein uptake present.     Left eye: Fluorescein uptake present.     Comments: Bilateral upper lid edema  Cardiovascular:     Rate and Rhythm: Normal rate.     Pulses: Normal pulses.  Pulmonary:     Effort: Pulmonary effort is normal.  Abdominal:     General: Abdomen is flat.  Musculoskeletal:        General: Normal range of motion.     Cervical back: Normal range of motion.  Skin:    General: Skin is warm and dry.  Neurological:     General: No focal deficit present.     Mental Status: She is alert and oriented to person, place, and time.  Psychiatric:        Mood and Affect: Mood normal.     UC Treatments / Results  Labs (all labs ordered are listed, but only abnormal results are displayed) Labs Reviewed - No data to display  EKG   Radiology No results found.  Procedures Procedures (including critical care time)  Medications Ordered in UC Medications - No data to display  Initial Impression / Assessment and  Plan / UC Course  I have reviewed the triage vital signs and the nursing notes.  Pertinent labs & imaging results that were available during my care of the patient were reviewed by me and considered in my medical decision making (see chart for details).    Assessment positive for bilateral corneal abrasions.  Will treat with Polytrim eyedrops 1 drop in each eye every 4 hours for the next 7 days.  May also use Voltaren eyedrops as needed for pain.  May take ibuprofen and/or Tylenol as needed.  Patient instructed not to use contacts or other eyedrops until she has been evaluated by ophthalmology.  Follow-up with ophthalmology this week to ensure proper healing.  Return or go to the emergency room for any worsening symptoms. Final Clinical Impressions(s) / UC Diagnoses   Final diagnoses:  Bilateral  corneal abrasions, initial encounter     Discharge Instructions      Use the polytrim eye drop 1 drop in each every every 4 hours while awake for the next 7 days.   Do not wear contacts for the next few days.   Use the Voltaren eye drop as needed for pain.   You can take Ibuprofen and/or Tylenol as needed for pain.    Follow up with your eye doctor as soon as possible to ensure that eyes are healing.    Return or go to the Emergency Department if symptoms worsen or do not improve in the next few days.        ED Prescriptions     Medication Sig Dispense Auth. Provider   trimethoprim-polymyxin b (POLYTRIM) ophthalmic solution Place 1 drop into both eyes every 4 (four) hours. 10 mL Pearson Forster, NP   diclofenac (VOLTAREN) 0.1 % ophthalmic solution Place 1 drop into both eyes 4 (four) times daily. 5 mL Pearson Forster, NP      PDMP not reviewed this encounter.   Pearson Forster, NP 07/13/20 1252

## 2020-07-13 NOTE — Discharge Instructions (Addendum)
Use the polytrim eye drop 1 drop in each every every 4 hours while awake for the next 7 days.   Do not wear contacts for the next few days.   Use the Voltaren eye drop as needed for pain.   You can take Ibuprofen and/or Tylenol as needed for pain.    Follow up with your eye doctor as soon as possible to ensure that eyes are healing.    Return or go to the Emergency Department if symptoms worsen or do not improve in the next few days.

## 2020-07-13 NOTE — ED Triage Notes (Signed)
Pt present bilateral red eyes, symptoms started yesterday. Pt state that the she was putting contacts along with eye drops and both eyes begin to become itchy, watery and red.

## 2020-07-15 ENCOUNTER — Other Ambulatory Visit: Payer: Self-pay | Admitting: Specialist

## 2020-07-15 ENCOUNTER — Other Ambulatory Visit: Payer: Self-pay

## 2020-07-15 MED FILL — Duloxetine HCl Enteric Coated Pellets Cap 60 MG (Base Eq): ORAL | 30 days supply | Qty: 30 | Fill #2 | Status: AC

## 2020-07-15 MED FILL — Lidocaine Patch 5%: CUTANEOUS | 30 days supply | Qty: 30 | Fill #0 | Status: AC

## 2020-07-16 ENCOUNTER — Other Ambulatory Visit: Payer: Self-pay

## 2020-07-16 ENCOUNTER — Telehealth: Payer: Self-pay | Admitting: Radiology

## 2020-07-16 MED ORDER — ACETAMINOPHEN-CODEINE 300-30 MG PO TABS: ORAL_TABLET | ORAL | 0 refills | Status: DC

## 2020-07-16 NOTE — Telephone Encounter (Signed)
I called in tylenol #3 to [facility]--1 tablet evry 12 hours prn pain # 15---Dr. Louanne Skye printed the rx instead of e-prescribing.

## 2020-07-17 ENCOUNTER — Other Ambulatory Visit: Payer: Self-pay

## 2020-07-23 DIAGNOSIS — Z79899 Other long term (current) drug therapy: Secondary | ICD-10-CM | POA: Diagnosis not present

## 2020-07-28 ENCOUNTER — Other Ambulatory Visit: Payer: Self-pay

## 2020-07-28 MED FILL — Atorvastatin Calcium Tab 20 MG (Base Equivalent): ORAL | 30 days supply | Qty: 30 | Fill #3 | Status: AC

## 2020-07-28 MED FILL — Fluticasone Propionate Nasal Susp 50 MCG/ACT: NASAL | 30 days supply | Qty: 16 | Fill #1 | Status: AC

## 2020-07-28 MED FILL — Oxybutynin Chloride Tab 5 MG: ORAL | 30 days supply | Qty: 60 | Fill #2 | Status: AC

## 2020-07-28 MED FILL — Amitriptyline HCl Tab 50 MG: ORAL | 30 days supply | Qty: 30 | Fill #2 | Status: AC

## 2020-07-29 ENCOUNTER — Ambulatory Visit: Payer: Medicaid Other | Admitting: Pulmonary Disease

## 2020-07-29 ENCOUNTER — Encounter: Payer: Self-pay | Admitting: Pulmonary Disease

## 2020-07-29 ENCOUNTER — Other Ambulatory Visit: Payer: Self-pay

## 2020-07-29 DIAGNOSIS — Z720 Tobacco use: Secondary | ICD-10-CM | POA: Diagnosis not present

## 2020-07-29 DIAGNOSIS — J432 Centrilobular emphysema: Secondary | ICD-10-CM

## 2020-07-29 DIAGNOSIS — R918 Other nonspecific abnormal finding of lung field: Secondary | ICD-10-CM | POA: Diagnosis not present

## 2020-07-29 MED ORDER — UMECLIDINIUM-VILANTEROL 62.5-25 MCG/INH IN AEPB: 1.0000 | INHALATION_SPRAY | Freq: Every day | RESPIRATORY_TRACT | 0 refills | Status: DC

## 2020-07-29 NOTE — Assessment & Plan Note (Signed)
Multiple nodules were noted in the lung in June 2021, largest 5 mm.  We will schedule annual follow-up CT chest without contrast

## 2020-07-29 NOTE — Patient Instructions (Signed)
You have emphysema You have to QUIT smoking !!! Start nicotine patches  Sample of Anoro once daily - call us forRx if this works  Schedule pFTs  CT chest WO con for multiple lung nodules

## 2020-07-29 NOTE — Assessment & Plan Note (Signed)
Quit smoking resources were offered to the patient. I encouraged her to start with nicotine patches and gum and see if she is able to cut down on her smoking.  If unable we can consider a Nicotrol inhaler

## 2020-07-29 NOTE — Assessment & Plan Note (Signed)
Noted on previous CT scan. We will schedule PFTs to assess lung function. Due to her persistent dyspnea and frequent albuterol usage, she will benefit from LABA/LAMA. We will give her a sample of Anoro today, she will call for prescription of this works

## 2020-07-29 NOTE — Progress Notes (Signed)
Subjective:    Patient ID: Cheryl King, female    DOB: 1965-09-24, 55 y.o.   MRN: [id number]  HPI  55 year old smoker who presents for evaluation of dyspnea on exertion She smokes about half pack per day starting as a teenager for 40 years, more than 20 pack years. She reports shortness of breath ongoing for about a year worse on walking, bringing in groceries from her car.  Albuterol seems to help for a couple of hours but dyspnea returns and she wonders if she needs a different type of inhaler. She reports occasional wheezing and a dry cough.  She uses albuterol 3-4 times a day.  We reviewed low-dose CT scan from June 2021 showing multiple pulmonary nodules and moderate centrilobular emphysema  She denies frequent chest colds She lives by herself, has a cat and a dog in the house.  She reports seasonal allergies for which she takes over-the-counter medications  Significant tests/ events reviewed  LDCT 06/2019 >>Moderate centrilobular emphysema. Numerous small solid pulmonary nodules scattered throughout both lungs, largest 5.0 mm Main PA 3.2 cm  Past Medical History:  Diagnosis Date   Acid reflux    Allergy    Carpal tunnel syndrome    Depression    Diverticulitis    Hyperlipidemia    Low back pain    Neck pain    Sacroiliac inflammation ([place])    Sciatica    Seasonal allergies    Past Surgical History:  Procedure Laterality Date   ABDOMINAL SURGERY     ANKLE SURGERY     lt.   PARTIAL HYSTERECTOMY     right breast cyst removed      URETHRAL DIVERTICULUM REPAIR     uretrral diverticultis    Allergies  Allergen Reactions   Latex Other (See Comments)    Pt unsure of reaction   Percocet [Oxycodone-Acetaminophen] Nausea Only   Vicodin [Hydrocodone-Acetaminophen] Nausea Only   Penicillins Rash    .soc   Family History  Problem Relation Age of Onset   Healthy Mother    Other Father        Unsure of medical history   Hypertension Maternal Grandmother     Heart Problems Maternal Grandmother    Asthma Maternal Aunt    Diabetes Maternal Aunt    Cancer Maternal Aunt    Breast cancer Maternal Aunt    Colon cancer Neg Hx    Pancreatic cancer Neg Hx    Rectal cancer Neg Hx    Stomach cancer Neg Hx    Colon polyps Neg Hx    Esophageal cancer Neg Hx       Review of Systems Shortness of breath with activity Acid heartburn Anxiety/depression Headaches  Constitutional: negative for anorexia, fevers and sweats  Eyes: negative for irritation, redness and visual disturbance  Ears, nose, mouth, throat, and face: negative for earaches, epistaxis, nasal congestion and sore throat  Respiratory: negative for hemoptysis  Cardiovascular: negative for chest pain, lower extremity edema, orthopnea, palpitations and syncope  Gastrointestinal: negative for abdominal pain, constipation, diarrhea, melena, nausea and vomiting  Genitourinary:negative for dysuria, frequency and hematuria  Hematologic/lymphatic: negative for bleeding, easy bruising and lymphadenopathy  Musculoskeletal:negative for arthralgias, muscle weakness and stiff joints  Neurological: negative for coordination problems, gait problems, headaches and weakness  Endocrine: negative for diabetic symptoms including polydipsia, polyuria and weight loss     Objective:   Physical Exam  Gen. Pleasant, well-nourished, in no distress, normal affect ENT - no pallor,icterus, no post nasal  drip Neck: No JVD, no thyromegaly, no carotid bruits Lungs: no use of accessory muscles, no dullness to percussion, decreased without rales or rhonchi  Cardiovascular: Rhythm regular, heart sounds  normal, no murmurs or gallops, no peripheral edema Abdomen: soft and non-tender, no hepatosplenomegaly, BS normal. Musculoskeletal: No deformities, no cyanosis or clubbing Neuro:  alert, non focal       Assessment & Plan:

## 2020-07-30 DIAGNOSIS — F4310 Post-traumatic stress disorder, unspecified: Secondary | ICD-10-CM | POA: Diagnosis not present

## 2020-07-30 DIAGNOSIS — F331 Major depressive disorder, recurrent, moderate: Secondary | ICD-10-CM | POA: Diagnosis not present

## 2020-07-31 ENCOUNTER — Other Ambulatory Visit: Payer: Self-pay

## 2020-08-04 ENCOUNTER — Other Ambulatory Visit: Payer: Self-pay

## 2020-08-05 ENCOUNTER — Ambulatory Visit: Payer: Medicaid Other | Attending: Nurse Practitioner

## 2020-08-05 ENCOUNTER — Other Ambulatory Visit: Payer: Self-pay

## 2020-08-05 DIAGNOSIS — R262 Difficulty in walking, not elsewhere classified: Secondary | ICD-10-CM | POA: Insufficient documentation

## 2020-08-05 DIAGNOSIS — M5442 Lumbago with sciatica, left side: Secondary | ICD-10-CM | POA: Diagnosis present

## 2020-08-05 DIAGNOSIS — R2689 Other abnormalities of gait and mobility: Secondary | ICD-10-CM | POA: Diagnosis present

## 2020-08-05 DIAGNOSIS — M6281 Muscle weakness (generalized): Secondary | ICD-10-CM | POA: Diagnosis present

## 2020-08-05 DIAGNOSIS — G8929 Other chronic pain: Secondary | ICD-10-CM | POA: Diagnosis present

## 2020-08-05 DIAGNOSIS — M62830 Muscle spasm of back: Secondary | ICD-10-CM

## 2020-08-05 NOTE — Therapy (Addendum)
[place], [place], [address] Phone: [phone]   Fax:  [phone]  Physical Therapy Treatment  Patient Details  Name: Cheryl King MRN: [id number] Date of Birth: 03-27-1965 Referring Provider (PT): Jeanella Anton, NP   Encounter Date: 08/05/2020   PT End of Session - 08/05/20 1312     Visit Number 1    Number of Visits 9    Date for PT Re-Evaluation 09/09/20    Authorization Type [place] MEDICAID HEALTHY BLUE    PT Start Time 0930    PT Stop Time 1015    PT Time Calculation (min) 45 min    Equipment Utilized During Treatment Other (comment)   [place]   Activity Tolerance Patient tolerated treatment well    Behavior During Therapy [facility] for tasks assessed/performed             Past Medical History:  Diagnosis Date   Acid reflux    Allergy    Carpal tunnel syndrome    Depression    Diverticulitis    Hyperlipidemia    Low back pain    Neck pain    Sacroiliac inflammation ([place])    Sciatica    Seasonal allergies     Past Surgical History:  Procedure Laterality Date   ABDOMINAL SURGERY     ANKLE SURGERY     lt.   PARTIAL HYSTERECTOMY     right breast cyst removed      URETHRAL DIVERTICULUM REPAIR     uretrral diverticultis    There were no vitals filed for this visit.   Subjective Assessment - 08/05/20 0946     Subjective Pt reports an extended Hx of low back/gluteal and intermittent L posterior LE pain. Pt notes her L LE is hurting her the most today. Pt reports some improvement in pain in the past with PT with her last time frame of treatment occuring a little over a year ago. Pt remarked her motivation to keep exercising after PT is low. Pt reports an insidious of onset of low back pain developing around 2009. She reports in the past year her low back and L LE pain has progressively worsened.    How long can you sit comfortably? Occasionally for a short period, has to keep moving  around    How long can you stand comfortably? Occasionally for a short period, has to keep moving around    How long can you walk comfortably? Occasionally for a short period, has to keep moving around    Diagnostic tests Ap and lateral flexion and extension radiographs demonstrate degenerative   disc narrowing L1-2, L2-3, L3-4 and L5-S1 with vacuum sign at L5-S1 and   retrolisthesis of L2-3 and L5-S1 anterior disc osteophytes at L2-3 and   L5-S1 with end plate sclerosis and cystic changes of the endplates at both   B3-5 and L5-S1. The degenerative changes appear to have progressed since   the last radiograph from 01/2019.    Patient Stated Goals To controll my pain better    Currently in Pain? Yes    Pain Score 8     Pain Location Back    Pain Orientation Right;Left;Posterior    Pain Descriptors / Indicators Aching    Pain Type Chronic pain    Pain Onset More than a month ago    Pain Frequency Constant    Aggravating Factors  Pain increases with increased activity    Pain Relieving Factors Predisone helps the most  [facility] PT Assessment - 08/05/20 0001       Assessment   Medical Diagnosis Low back pain, unspecified    Referring Provider (PT) Jeanella Anton, NP    Onset Date/Surgical Date --   since 2009   Hand Dominance Right    Next MD Visit 08/19/20    Prior Therapy yes      Precautions   Precautions None      Restrictions   Weight Bearing Restrictions No      Balance Screen   Has the patient fallen in the past 6 months No      [place] residence    Living Arrangements Children;Parent    Type of [place] to enter    Entrance Stairs-Number of Steps 6    Entrance Stairs-Rails Can reach both    [place] One level    [place] - single point      Prior Function   Level of Independence Independent with household mobility with device    Vocation Unemployed      Cognition    Overall Cognitive Status Within Functional Limits for tasks assessed      Observation/Other Assessments   Focus on Therapeutic Outcomes (FOTO)  NA      Sensation   Light Touch Appears Intact      Posture/Postural Control   Posture/Postural Control Postural limitations    Postural Limitations Forward head;Decreased thoracic kyphosis      Deep Tendon Reflexes   DTR Assessment Site Patella;Achilles    Patella DTR 2+    Achilles DTR 2+      ROM / Strength   AROM / PROM / Strength AROM      AROM   AROM Assessment Site Lumbar    Lumbar Flexion 75% limited with low back and L LE pain provoked    Lumbar Extension 75% limited with low back and L LE pain provoked    Lumbar - Right Side Bend 50% limited with low back and L LE pain provoked    Lumbar - Left Side Bend 50% limited with low back and L LE pain provoked    Lumbar - Right Rotation 25% limited with low back and L LE pain provoked    Lumbar - Left Rotation 25% limited with low back and L LE pain provoked      Palpation   Palpation comment TTP to the lumbar paraspinals      Special Tests    Special Tests Lumbar    Lumbar Tests Slump Test      Slump test   Findings Positive      Transfers   Transfers Sit to Stand;Stand to Sit   decreased wt bearing through the L LE c sit t/f standing     Ambulation/Gait   Ambulation/Gait Yes    Ambulation/Gait Assistance 6: Modified independent (Device/Increase time)    Assistive device Straight cane    Gait Pattern Antalgic   L LE   Gait velocity decreased pace                                   PT Education - 08/05/20 1310     Education Details Eval findings, POC, use of heat, cold and TENs unit to help manage her pain    Person(s) Educated Patient    Methods Explanation;Demonstration;Tactile cues;Verbal cues;Handout  Comprehension Verbalized understanding;Returned demonstration;Verbal cues required;Tactile cues required              PT Short Term  Goals - 08/05/20 1628       PT SHORT TERM GOAL #1   Title Pt will be Ind in an initial HEP    Status New    Target Date 08/26/20      PT SHORT TERM GOAL #2   Title Pt will voice understanding of measures to assist pain reduction and management    Status New    Target Date 08/26/20               PT Long Term Goals - 08/05/20 1629       PT LONG TERM GOAL #1   Title Pt will report improvement with chronic low back and L LE pain to 4/10 or less with daily activities    Status New    Target Date 09/06/20      PT LONG TERM GOAL #2   Title Pt's lumbar movement will all improve 25% greater than her baseline on eval    Baseline see flowsheets    Status New    Target Date 09/06/20      PT LONG TERM GOAL #3   Title Pt will demonstrate proper body mechanics for household activities to minimize low back strain.    Target Date 09/02/20                   Plan - 08/05/20 1315     Clinical Impression Statement Pt presents to PT with an extended Hx of low back pain. X-rays show multiple areas of degerative changes for the lumbar spine. Pt reports the pain is getting wrose and has developed pain extending down the posterior aspect of her L LE. LE DTRs, LE myotomal screeen, and SLR were negative. the Slump test was positive. All.lumbar movement patterns were painful and pt positioning (supine c knee up, prone, and sidelying) to assist in pain reduction was not able to be determined. Pt may benefit from a trial of PT 2w4 to assist in pain reduction and management to improve function and QOL.    Personal Factors and Comorbidities Time since onset of injury/illness/exacerbation    Examination-Activity Limitations Locomotion Level;Stand;Lift;Bend;Transfers    Examination-Participation Restrictions --   daily activities   Stability/Clinical Decision Making Evolving/Moderate complexity    Clinical Decision Making Moderate    Rehab Potential Fair    PT Frequency 2x / week    PT  Duration 4 weeks    PT Treatment/Interventions ADLs/Self Care Home Management;Aquatic Therapy;Cryotherapy;Electrical Stimulation;Moist Heat;Traction;Therapeutic activities;Therapeutic exercise;Manual techniques;Patient/family education;Passive range of motion;Dry needling;Taping;Spinal Manipulations    PT Next Visit Plan Assess use of TENs ( pt is to bring in her own unit), initiate ther ex and HEP. Assess 5xSTS and 2MWT and set functional goals    Consulted and Agree with Plan of Care Patient             Patient will benefit from skilled therapeutic intervention in order to improve the following deficits and impairments:  Abnormal gait, Decreased range of motion, Difficulty walking, Increased muscle spasms, Decreased activity tolerance, Pain, Decreased strength  Visit Diagnosis: Chronic low back pain with left-sided sciatica, unspecified back pain laterality  Difficulty in walking, not elsewhere classified  Muscle spasm of back  Other abnormalities of gait and mobility     Problem List Patient Active Problem List   Diagnosis Date Noted   Centrilobular emphysema (  [place]) 07/29/2020   Multiple nodules of lung 07/29/2020   Hyperlipidemia 07/18/2017   Ulnar neuropathy 04/18/2017   GERD (gastroesophageal reflux disease) 10/19/2016   Prediabetes 05/03/2016   Cubital tunnel syndrome on right 03/01/2016   Numbness and tingling of right arm 01/01/2016   Chlamydia infection 01/27/2015   Tobacco abuse 12/26/2013   Chronic lower back pain 04/13/2013   Sciatica 04/13/2013   Chronic neck pain 04/13/2013   Spondylosis of cervical spine 04/13/2013   Gar Ponto MS, PT 08/05/20 4:39 PM   [facility] [address] [place], [place], [address] Phone: [phone]   Fax:  [phone]  Name: Tobi Groesbeck MRN: [id number] Date of Birth: 1965/04/29  Check all possible CPT codes: 97110- Therapeutic Exercise, [phone] - Gait Training,  [phone] - Manual Therapy, 39584 - Therapeutic Activities, 41712 - Self Care, [phone] - Mechanical traction, [id number] - Electrical stimulation (Manual), [id number] - Iontophoresis, [id number] - Ultrasound, and [id number] - Aquatic therapy

## 2020-08-10 ENCOUNTER — Other Ambulatory Visit: Payer: Self-pay | Admitting: Specialist

## 2020-08-11 ENCOUNTER — Telehealth: Payer: Self-pay | Admitting: Pulmonary Disease

## 2020-08-11 NOTE — Telephone Encounter (Signed)
ATC, could not leave VM as no VM came up.

## 2020-08-12 ENCOUNTER — Other Ambulatory Visit: Payer: Self-pay | Admitting: Specialist

## 2020-08-12 ENCOUNTER — Other Ambulatory Visit: Payer: Self-pay

## 2020-08-12 ENCOUNTER — Ambulatory Visit: Payer: Medicaid Other

## 2020-08-12 DIAGNOSIS — M5442 Lumbago with sciatica, left side: Secondary | ICD-10-CM | POA: Diagnosis not present

## 2020-08-12 DIAGNOSIS — R262 Difficulty in walking, not elsewhere classified: Secondary | ICD-10-CM

## 2020-08-12 DIAGNOSIS — R2689 Other abnormalities of gait and mobility: Secondary | ICD-10-CM

## 2020-08-12 DIAGNOSIS — M62830 Muscle spasm of back: Secondary | ICD-10-CM

## 2020-08-12 DIAGNOSIS — G8929 Other chronic pain: Secondary | ICD-10-CM

## 2020-08-12 DIAGNOSIS — M6281 Muscle weakness (generalized): Secondary | ICD-10-CM

## 2020-08-12 MED ORDER — UMECLIDINIUM-VILANTEROL 62.5-25 MCG/INH IN AEPB: 1.0000 | INHALATION_SPRAY | Freq: Every day | RESPIRATORY_TRACT | 5 refills | Status: DC

## 2020-08-12 MED FILL — Duloxetine HCl Enteric Coated Pellets Cap 60 MG (Base Eq): ORAL | 30 days supply | Qty: 30 | Fill #3 | Status: AC

## 2020-08-12 MED FILL — Cyclobenzaprine HCl Tab 10 MG: ORAL | 30 days supply | Qty: 60 | Fill #2 | Status: AC

## 2020-08-12 NOTE — Telephone Encounter (Signed)
Called and spoke to pt. Pt states the Anoro is working well for her and wants a script sent to pharmacy. Per last OV with Dr. [name] to send script if Cheryl King works. Rx sent to preferred pharmacy. Pt verbalized understanding and denied any further questions or concerns at this time.

## 2020-08-12 NOTE — Therapy (Signed)
[place], [place], [address] Phone: [phone]   Fax:  [phone]  Physical Therapy Treatment  Patient Details  Name: Cheryl King MRN: [id number] Date of Birth: October 22, 1965 Referring Provider (PT): Jeanella Anton, NP   Encounter Date: 08/12/2020   PT End of Session - 08/12/20 1355     Visit Number 2    Number of Visits 9    Date for PT Re-Evaluation 09/09/20    Authorization Type [place] MEDICAID HEALTHY BLUE    PT Start Time 0720    PT Stop Time 0805    PT Time Calculation (min) 45 min    Activity Tolerance Patient tolerated treatment well    Behavior During Therapy [facility] for tasks assessed/performed             Past Medical History:  Diagnosis Date   Acid reflux    Allergy    Carpal tunnel syndrome    Depression    Diverticulitis    Hyperlipidemia    Low back pain    Neck pain    Sacroiliac inflammation ([place])    Sciatica    Seasonal allergies     Past Surgical History:  Procedure Laterality Date   ABDOMINAL SURGERY     ANKLE SURGERY     lt.   PARTIAL HYSTERECTOMY     right breast cyst removed      URETHRAL DIVERTICULUM REPAIR     uretrral diverticultis    There were no vitals filed for this visit.   Subjective Assessment - 08/12/20 1401     Subjective Pt reports her low ack and upper gluteal area bilat is hurting today following cutting the grass and pulling weeds this weekend.    Diagnostic tests Ap and lateral flexion and extension radiographs demonstrate degenerative   disc narrowing L1-2, L2-3, L3-4 and L5-S1 with vacuum sign at L5-S1 and   retrolisthesis of L2-3 and L5-S1 anterior disc osteophytes at L2-3 and   L5-S1 with end plate sclerosis and cystic changes of the endplates at both   [id number] and L5-S1. The degenerative changes appear to have progressed since   the last radiograph from 01/2019.    Patient Stated Goals To controll my pain better    Pain Score 8     Pain  Location Back    Pain Orientation Right;Left;Posterior    Pain Descriptors / Indicators Aching    Pain Type Chronic pain    Pain Onset More than a month ago    Pain Frequency Constant                               [facility] Adult PT Treatment/Exercise - 08/12/20 0001       Self-Care   Self-Care Other Self-Care Comments    Other Self-Care Comments  USe of TENS for pain management      Exercises   Exercises Lumbar      Lumbar Exercises: Stretches   Active Hamstring Stretch Left;2 reps;20 seconds   Pt reported the deleopment of radicular pai and the exs was DCed   Active Hamstring Stretch Limitations supine, hands behind knees    Pelvic Tilt 10 reps   3 sec   Piriformis Stretch Right;Left;2 reps;20 seconds    Other Lumbar Stretch Exercise Seated trunk flexion stretch, forward and laterally 3x, 15 sec      Modalities   Modalities Electrical Stimulation  Acupuncturist Location Low back and upper gluteal    Electrical Stimulation Action TENS    Electrical Stimulation Parameters Modulation, intensity as tolerated    Electrical Stimulation Goals Pain                    PT Education - 08/12/20 1353     Education Details HEP, use of TENs    Person(s) Educated Patient    Methods Explanation;Demonstration;Tactile cues;Verbal cues;Handout    Comprehension Verbalized understanding;Returned demonstration;Verbal cues required;Tactile cues required;Need further instruction              PT Short Term Goals - 08/05/20 1628       PT SHORT TERM GOAL #1   Title Pt will be Ind in an initial HEP    Status New    Target Date 08/26/20      PT SHORT TERM GOAL #2   Title Pt will voice understanding of measures to assist pain reduction and management    Status New    Target Date 08/26/20               PT Long Term Goals - 08/05/20 1629       PT LONG TERM GOAL #1   Title Pt will report improvement with chronic  low back and L LE pain to 4/10 or less with daily activities    Status New    Target Date 09/06/20      PT LONG TERM GOAL #2   Title Pt's lumbar movement will all improve 25% greater than her baseline on eval    Baseline see flowsheets    Status New    Target Date 09/06/20      PT LONG TERM GOAL #3   Title Pt will demonstrate proper body mechanics for household activities to minimize low back strain.    Target Date 09/02/20                   Plan - 08/12/20 1356     Clinical Impression Statement PT was provided for education and TENS trial for pain management. Pt brought in her own unit. She hasbeen afraid to use at home. Pt was set up on modulated TENS with a crossing pattern of the low back. After 5 mins of application, pt reported a reduction of pain to 6/10. PT then instructed pt in lumbopelvic flexibility exs for mobility and pain reduction/management. Pt completed properly and a written HEP was provided. Attempted hamstring strectching c the L LE, however pt experienced L LE pain and it was DCed.    Personal Factors and Comorbidities Time since onset of injury/illness/exacerbation    Examination-Activity Limitations Locomotion Level;Stand;Lift;Bend;Transfers    Stability/Clinical Decision Making Evolving/Moderate complexity    Clinical Decision Making Moderate    Rehab Potential Fair    PT Frequency 2x / week    PT Duration 4 weeks    PT Treatment/Interventions ADLs/Self Care Home Management;Aquatic Therapy;Cryotherapy;Electrical Stimulation;Moist Heat;Traction;Therapeutic activities;Therapeutic exercise;Manual techniques;Patient/family education;Passive range of motion;Dry needling;Taping;Spinal Manipulations    PT Next Visit Plan Assess use of TENs ( pt is to bring in her own unit), initiate ther ex and HEP. Assess 5xSTS and 2MWT and set functional goals    PT Home Exercise Plan Assess pt's response to the use of TENS at home and to the stretching exs initiated     Recommended Other Services [id number]    Consulted and Agree with Plan of Care Patient  Patient will benefit from skilled therapeutic intervention in order to improve the following deficits and impairments:  Abnormal gait, Decreased range of motion, Difficulty walking, Increased muscle spasms, Decreased activity tolerance, Pain, Decreased strength  Visit Diagnosis: Chronic low back pain with left-sided sciatica, unspecified back pain laterality  Difficulty in walking, not elsewhere classified  Muscle spasm of back  Other abnormalities of gait and mobility  Muscle weakness (generalized)     Problem List Patient Active Problem List   Diagnosis Date Noted   Centrilobular emphysema ([place]) 07/29/2020   Multiple nodules of lung 07/29/2020   Hyperlipidemia 07/18/2017   Ulnar neuropathy 04/18/2017   GERD (gastroesophageal reflux disease) 10/19/2016   Prediabetes 05/03/2016   Cubital tunnel syndrome on right 03/01/2016   Numbness and tingling of right arm 01/01/2016   Chlamydia infection 01/27/2015   Tobacco abuse 12/26/2013   Chronic lower back pain 04/13/2013   Sciatica 04/13/2013   Chronic neck pain 04/13/2013   Spondylosis of cervical spine 04/13/2013   Gar Ponto MS, PT 08/12/20 2:15 PM    [facility] [address] [place], [place], [address] Phone: [phone]   Fax:  [phone]  Name: Cheryl King MRN: [id number] Date of Birth: 05-30-1965

## 2020-08-12 NOTE — Telephone Encounter (Signed)
Lmtcb for pt.  

## 2020-08-13 ENCOUNTER — Telehealth: Payer: Self-pay | Admitting: Specialist

## 2020-08-13 ENCOUNTER — Other Ambulatory Visit: Payer: Self-pay

## 2020-08-13 DIAGNOSIS — F331 Major depressive disorder, recurrent, moderate: Secondary | ICD-10-CM | POA: Diagnosis not present

## 2020-08-13 DIAGNOSIS — F4310 Post-traumatic stress disorder, unspecified: Secondary | ICD-10-CM | POA: Diagnosis not present

## 2020-08-13 MED ORDER — NAPROXEN 500 MG PO TABS
ORAL_TABLET | Freq: Two times a day (BID) | ORAL | 3 refills | Status: DC
  Filled 2020-08-13: qty 60, 30d supply, fill #0
  Filled 2020-10-08: qty 60, 30d supply, fill #1

## 2020-08-13 NOTE — Telephone Encounter (Signed)
This medication is pending approval from Dr. Louanne Skye already

## 2020-08-13 NOTE — Telephone Encounter (Signed)
Pt called requesting a refill of naproxen. Please send to pharmacy on file. Pt phone number is [phone].

## 2020-08-14 ENCOUNTER — Ambulatory Visit: Payer: Medicaid Other

## 2020-08-14 ENCOUNTER — Other Ambulatory Visit: Payer: Self-pay

## 2020-08-14 DIAGNOSIS — M5442 Lumbago with sciatica, left side: Secondary | ICD-10-CM | POA: Diagnosis not present

## 2020-08-14 DIAGNOSIS — G8929 Other chronic pain: Secondary | ICD-10-CM

## 2020-08-14 DIAGNOSIS — R262 Difficulty in walking, not elsewhere classified: Secondary | ICD-10-CM

## 2020-08-14 DIAGNOSIS — M62830 Muscle spasm of back: Secondary | ICD-10-CM

## 2020-08-14 DIAGNOSIS — R2689 Other abnormalities of gait and mobility: Secondary | ICD-10-CM

## 2020-08-14 DIAGNOSIS — M6281 Muscle weakness (generalized): Secondary | ICD-10-CM

## 2020-08-14 NOTE — Therapy (Signed)
[place], [place], [address] Phone: [phone]   Fax:  [phone]  Physical Therapy Treatment  Patient Details  Name: Cheryl King MRN: [id number] Date of Birth: 07-02-1965 Referring Provider (PT): Jeanella Anton, NP   Encounter Date: 08/14/2020   PT End of Session - 08/14/20 0735     Visit Number 3    Number of Visits 9    Date for PT Re-Evaluation 09/09/20    Authorization Type [place] MEDICAID HEALTHY BLUE    PT Start Time 0717    PT Stop Time 0759    PT Time Calculation (min) 42 min    Activity Tolerance Patient tolerated treatment well    Behavior During Therapy [facility] for tasks assessed/performed             Past Medical History:  Diagnosis Date   Acid reflux    Allergy    Carpal tunnel syndrome    Depression    Diverticulitis    Hyperlipidemia    Low back pain    Neck pain    Sacroiliac inflammation ([place])    Sciatica    Seasonal allergies     Past Surgical History:  Procedure Laterality Date   ABDOMINAL SURGERY     ANKLE SURGERY     lt.   PARTIAL HYSTERECTOMY     right breast cyst removed      URETHRAL DIVERTICULUM REPAIR     uretrral diverticultis    There were no vitals filed for this visit.   Subjective Assessment - 08/14/20 0721     Subjective Pt reports her pain is at a reasonable level for her at 7/10. It normally will go up with increased activity during the day    Diagnostic tests Ap and lateral flexion and extension radiographs demonstrate degenerative   disc narrowing L1-2, L2-3, L3-4 and L5-S1 with vacuum sign at L5-S1 and   retrolisthesis of L2-3 and L5-S1 anterior disc osteophytes at L2-3 and   L5-S1 with end plate sclerosis and cystic changes of the endplates at both   X33443 and L5-S1. The degenerative changes appear to have progressed since   the last radiograph from 01/2019.    Patient Stated Goals To control my pain better    Currently in Pain? Yes     Pain Score 7     Pain Location Back    Pain Orientation Right;Left;Posterior    Pain Descriptors / Indicators Aching    Pain Type Chronic pain    Pain Onset More than a month ago    Pain Frequency Constant    Aggravating Factors  Pain increases with increased activity    Pain Relieving Factors Predisone                               [facility] Adult PT Treatment/Exercise - 08/14/20 0001       Ambulation/Gait   Gait Pattern Step-through pattern    Gait velocity Appropriate pace      Lumbar Exercises: Stretches   Active Hamstring Stretch Right;Left    Active Hamstring Stretch Limitations Seated c knee ext and ankle DF    Lower Trunk Rotation 5 reps   5 sec   Pelvic Tilt 10 reps   3 sec   Piriformis Stretch Right;Left;2 reps;20 seconds    Other Lumbar Stretch Exercise Seated trunk flexion stretch, forward and laterally 3x, 15 sec      Lumbar  Exercises: Supine   Clam 15 reps    Clam Limitations green Tband    Bent Knee Raise 10 reps   2 sets   Bent Knee Raise Limitations c PPT    Bridge 10 reps    Bridge Limitations c PPT    Other Supine Lumbar Exercises hip add sets c ball 10x, 5 sec                    PT Education - 08/14/20 1351     Education Details Update on HEP. Use of TENS for pain relief for extended periods or periodically as needed    Person(s) Educated Patient    Methods Explanation;Demonstration;Tactile cues;Verbal cues;Handout    Comprehension Verbalized understanding;Returned demonstration;Verbal cues required;Tactile cues required;Need further instruction              PT Short Term Goals - 08/05/20 1628       PT SHORT TERM GOAL #1   Title Pt will be Ind in an initial HEP    Status New    Target Date 08/26/20      PT SHORT TERM GOAL #2   Title Pt will voice understanding of measures to assist pain reduction and management    Status New    Target Date 08/26/20               PT Long Term Goals - 08/05/20 1629        PT LONG TERM GOAL #1   Title Pt will report improvement with chronic low back and L LE pain to 4/10 or less with daily activities    Status New    Target Date 09/06/20      PT LONG TERM GOAL #2   Title Pt's lumbar movement will all improve 25% greater than her baseline on eval    Baseline see flowsheets    Status New    Target Date 09/06/20      PT LONG TERM GOAL #3   Title Pt will demonstrate proper body mechanics for household activities to minimize low back strain.    Target Date 09/02/20                   Plan - 08/14/20 1349     Clinical Impression Statement Pt reports she has not tried using the TENS unit since the last PT session. Pt was encouraged to use if she finds the TENS helps reduce her pain and to tolerate activity better. Pt was advised she could use the TENS for extended time frames or periodically. Today PT was continued for lumbopelvic flexibility c a flexion bias and lumbopelvic strengthening exs were initiated for core and hips. Pt reported her low back felt better following the PT session.    Personal Factors and Comorbidities Time since onset of injury/illness/exacerbation    Examination-Activity Limitations Locomotion Level;Stand;Lift;Bend;Transfers    Stability/Clinical Decision Making Evolving/Moderate complexity    Clinical Decision Making Moderate    Rehab Potential Fair    PT Frequency 2x / week    PT Duration 4 weeks    PT Treatment/Interventions ADLs/Self Care Home Management;Aquatic Therapy;Cryotherapy;Electrical Stimulation;Moist Heat;Traction;Therapeutic activities;Therapeutic exercise;Manual techniques;Patient/family education;Passive range of motion;Dry needling;Taping;Spinal Manipulations    PT Next Visit Plan Assess 5xSTS and 2MWT and set functional goals. Assess reponse to HEP.    PT Home Exercise Plan [id number]    Recommended Other Services --    Consulted and Agree with Plan of Care Patient  Patient will  benefit from skilled therapeutic intervention in order to improve the following deficits and impairments:  Abnormal gait, Decreased range of motion, Difficulty walking, Increased muscle spasms, Decreased activity tolerance, Pain, Decreased strength  Visit Diagnosis: Chronic low back pain with left-sided sciatica, unspecified back pain laterality  Difficulty in walking, not elsewhere classified  Muscle spasm of back  Muscle weakness (generalized)  Other abnormalities of gait and mobility     Problem List Patient Active Problem List   Diagnosis Date Noted   Centrilobular emphysema ([place]) 07/29/2020   Multiple nodules of lung 07/29/2020   Hyperlipidemia 07/18/2017   Ulnar neuropathy 04/18/2017   GERD (gastroesophageal reflux disease) 10/19/2016   Prediabetes 05/03/2016   Cubital tunnel syndrome on right 03/01/2016   Numbness and tingling of right arm 01/01/2016   Chlamydia infection 01/27/2015   Tobacco abuse 12/26/2013   Chronic lower back pain 04/13/2013   Sciatica 04/13/2013   Chronic neck pain 04/13/2013   Spondylosis of cervical spine 04/13/2013   Gar Ponto MS, PT 08/14/20 2:01 PM   [facility] [address] [place], [place], [address] Phone: [phone]   Fax:  [phone]  Name: Halaina Saler MRN: [id number] Date of Birth: 09-22-1965

## 2020-08-18 ENCOUNTER — Telehealth: Payer: Self-pay | Admitting: Pulmonary Disease

## 2020-08-18 DIAGNOSIS — R918 Other nonspecific abnormal finding of lung field: Secondary | ICD-10-CM

## 2020-08-18 NOTE — Telephone Encounter (Signed)
Called pt and there was no answer-LMTCB °

## 2020-08-18 NOTE — Telephone Encounter (Signed)
Spoke with patient who is calling about when her CT scan is going to be scheduled. Advised her she would receive a call from our patient care coordinator to get it scheduled. She expressed understanding. Looking in patients chart I don't see that the order was placed her at appointment in July. Order has been placed. Nothing further needed at this time.

## 2020-08-19 ENCOUNTER — Other Ambulatory Visit: Payer: Self-pay

## 2020-08-19 ENCOUNTER — Ambulatory Visit: Payer: Medicaid Other | Attending: Nurse Practitioner

## 2020-08-19 DIAGNOSIS — M25551 Pain in right hip: Secondary | ICD-10-CM | POA: Diagnosis not present

## 2020-08-19 DIAGNOSIS — G8929 Other chronic pain: Secondary | ICD-10-CM | POA: Insufficient documentation

## 2020-08-19 DIAGNOSIS — M62830 Muscle spasm of back: Secondary | ICD-10-CM | POA: Diagnosis not present

## 2020-08-19 DIAGNOSIS — R2689 Other abnormalities of gait and mobility: Secondary | ICD-10-CM | POA: Insufficient documentation

## 2020-08-19 DIAGNOSIS — M6281 Muscle weakness (generalized): Secondary | ICD-10-CM | POA: Insufficient documentation

## 2020-08-19 DIAGNOSIS — M5442 Lumbago with sciatica, left side: Secondary | ICD-10-CM | POA: Insufficient documentation

## 2020-08-19 DIAGNOSIS — M5441 Lumbago with sciatica, right side: Secondary | ICD-10-CM | POA: Insufficient documentation

## 2020-08-19 DIAGNOSIS — Z79899 Other long term (current) drug therapy: Secondary | ICD-10-CM | POA: Diagnosis not present

## 2020-08-19 DIAGNOSIS — M25552 Pain in left hip: Secondary | ICD-10-CM | POA: Insufficient documentation

## 2020-08-19 DIAGNOSIS — R262 Difficulty in walking, not elsewhere classified: Secondary | ICD-10-CM | POA: Diagnosis not present

## 2020-08-19 NOTE — Therapy (Signed)
[place], [place], [address] Phone: [phone]   Fax:  [phone]  Physical Therapy Treatment  Patient Details  Name: Cheryl King MRN: [id number] Date of Birth: [birth year] Referring Provider (PT): Jeanella Anton, NP   Encounter Date: 08/19/2020   PT End of Session - 08/19/20 0742     Visit Number 4    Number of Visits 9    Date for PT Re-Evaluation 09/09/20    Authorization Type [place] MEDICAID HEALTHY BLUE    PT Start Time 0719    PT Stop Time 0800    PT Time Calculation (min) 41 min    Activity Tolerance Patient tolerated treatment well    Behavior During Therapy [facility] for tasks assessed/performed             Past Medical History:  Diagnosis Date   Acid reflux    Allergy    Carpal tunnel syndrome    Depression    Diverticulitis    Hyperlipidemia    Low back pain    Neck pain    Sacroiliac inflammation ([place])    Sciatica    Seasonal allergies     Past Surgical History:  Procedure Laterality Date   ABDOMINAL SURGERY     ANKLE SURGERY     lt.   PARTIAL HYSTERECTOMY     right breast cyst removed      URETHRAL DIVERTICULUM REPAIR     uretrral diverticultis    There were no vitals filed for this visit.   Subjective Assessment - 08/19/20 0726     Subjective Pt reports she is at a 10/10 with pain today following completing yard work yesterday. Pt notes she has a pain management appt today and an appt c Dr. Louanne Skye tomorrow. Pt states she used the TENS unit on Saturday and felt some relief.    Diagnostic tests Ap and lateral flexion and extension radiographs demonstrate degenerative   disc narrowing L1-2, L2-3, L3-4 and L5-S1 with vacuum sign at L5-S1 and   retrolisthesis of L2-3 and L5-S1 anterior disc osteophytes at L2-3 and   L5-S1 with end plate sclerosis and cystic changes of the endplates at both   [id number] and L5-S1. The degenerative changes appear to have progressed since   the  last radiograph from 01/2019.    Patient Stated Goals To control my pain better    Currently in Pain? Yes    Pain Score 10-Worst pain ever    Pain Location Back    Pain Orientation Right;Left    Pain Descriptors / Indicators Aching    Pain Type Chronic pain    Pain Onset More than a month ago    Pain Frequency Constant    Aggravating Factors  Pain increases with increased activity    Pain Relieving Factors Predisone    Multiple Pain Sites No                               [facility] Adult PT Treatment/Exercise - 08/19/20 0001       Exercises   Exercises Lumbar      Lumbar Exercises: Stretches   Active Hamstring Stretch Right;Left    Active Hamstring Stretch Limitations Seated c knee ext and ankle DF    Lower Trunk Rotation 5 reps   5 sec   Piriformis Stretch Right;Left;2 reps;20 seconds    Other Lumbar Stretch Exercise Seated trunk flexion stretch, forward and laterally  3x, 15 sec      Lumbar Exercises: Aerobic   Nustep 5 mins, L3, UE/LE      Lumbar Exercises: Supine   Bent Knee Raise 10 reps   2 sets   Bent Knee Raise Limitations c PPT    Bridge 15 reps    Bridge Limitations c PPT    Other Supine Lumbar Exercises Lower trap, 2#, shoulder 90d to 160d, 20x      Modalities   Modalities Electrical Stimulation      Electrical Stimulation   Electrical Stimulation Location Low back and upper gluteal    Electrical Stimulation Action TENS    Electrical Stimulation Parameters Modulation mode, intensity as tolerated, crossing pattern    Electrical Stimulation Goals Pain                      PT Short Term Goals - 08/05/20 1628       PT SHORT TERM GOAL #1   Title Pt will be Ind in an initial HEP    Status New    Target Date 08/26/20      PT SHORT TERM GOAL #2   Title Pt will voice understanding of measures to assist pain reduction and management    Status New    Target Date 08/26/20               PT Long Term Goals - 08/05/20 1629        PT LONG TERM GOAL #1   Title Pt will report improvement with chronic low back and L LE pain to 4/10 or less with daily activities    Status New    Target Date 09/06/20      PT LONG TERM GOAL #2   Title Pt's lumbar movement will all improve 25% greater than her baseline on eval    Baseline see flowsheets    Status New    Target Date 09/06/20      PT LONG TERM GOAL #3   Title Pt will demonstrate proper body mechanics for household activities to minimize low back strain.    Target Date 09/02/20                   Plan - 08/19/20 1327     Clinical Impression Statement TENS was applied prior to the start of exercises. PT was completed for lumbopelvic flexibility and strengthening. Following the PT session, pt reported a decrease in pain to 8/10. Pt participated well with ther ex. Pt reports she plans to use the TENS unit more often to manage her pain.    Personal Factors and Comorbidities Time since onset of injury/illness/exacerbation    Examination-Activity Limitations Locomotion Level;Stand;Lift;Bend;Transfers    Stability/Clinical Decision Making Evolving/Moderate complexity    Clinical Decision Making Moderate    Rehab Potential Fair    PT Frequency 2x / week    PT Duration 4 weeks    PT Treatment/Interventions ADLs/Self Care Home Management;Aquatic Therapy;Cryotherapy;Electrical Stimulation;Moist Heat;Traction;Therapeutic activities;Therapeutic exercise;Manual techniques;Patient/family education;Passive range of motion;Dry needling;Taping;Spinal Manipulations    PT Next Visit Plan Assess 5xSTS and 2MWT and set functional goals. Assess reponse to HEP.    PT Home Exercise Plan [id number]    Consulted and Agree with Plan of Care Patient             Patient will benefit from skilled therapeutic intervention in order to improve the following deficits and impairments:  Abnormal gait, Decreased range of motion, Difficulty walking, Increased muscle spasms,  Decreased  activity tolerance, Pain, Decreased strength  Visit Diagnosis: Chronic low back pain with left-sided sciatica, unspecified back pain laterality  Difficulty in walking, not elsewhere classified  Muscle spasm of back  Muscle weakness (generalized)  Other abnormalities of gait and mobility  Chronic bilateral low back pain with bilateral sciatica  Pain in left hip  Pain in right hip     Problem List Patient Active Problem List   Diagnosis Date Noted   Centrilobular emphysema ([place]) 07/29/2020   Multiple nodules of lung 07/29/2020   Hyperlipidemia 07/18/2017   Ulnar neuropathy 04/18/2017   GERD (gastroesophageal reflux disease) 10/19/2016   Prediabetes 05/03/2016   Cubital tunnel syndrome on right 03/01/2016   Numbness and tingling of right arm 01/01/2016   Chlamydia infection 01/27/2015   Tobacco abuse 12/26/2013   Chronic lower back pain 04/13/2013   Sciatica 04/13/2013   Chronic neck pain 04/13/2013   Spondylosis of cervical spine 04/13/2013   Gar Ponto MS, PT 08/19/20 3:01 PM   [facility] [address] [place], [place], [address] Phone: [phone]   Fax:  [phone]  Name: Cheryl King MRN: [id number] Date of Birth: 07/03/1965

## 2020-08-20 ENCOUNTER — Encounter: Payer: Self-pay | Admitting: Specialist

## 2020-08-20 ENCOUNTER — Ambulatory Visit (INDEPENDENT_AMBULATORY_CARE_PROVIDER_SITE_OTHER): Payer: Medicaid Other | Admitting: Specialist

## 2020-08-20 VITALS — BP 103/62 | HR 67 | Ht 67.0 in | Wt 154.0 lb

## 2020-08-20 DIAGNOSIS — M5136 Other intervertebral disc degeneration, lumbar region: Secondary | ICD-10-CM

## 2020-08-20 DIAGNOSIS — M48062 Spinal stenosis, lumbar region with neurogenic claudication: Secondary | ICD-10-CM

## 2020-08-20 DIAGNOSIS — M7061 Trochanteric bursitis, right hip: Secondary | ICD-10-CM

## 2020-08-20 DIAGNOSIS — M5416 Radiculopathy, lumbar region: Secondary | ICD-10-CM

## 2020-08-20 MED ORDER — ACETAMINOPHEN-CODEINE 300-30 MG PO TABS: 1.0000 | ORAL_TABLET | Freq: Two times a day (BID) | ORAL | 0 refills | Status: DC | PRN

## 2020-08-20 NOTE — Patient Instructions (Signed)
Avoid frequent bending and stooping  No lifting greater than 10 lbs. May use ice or moist heat for pain. Weight loss is of benefit. Best medication for lumbar disc disease is arthritis medications like motrin, celebrex and naprosyn. Exercise is important to improve your indurance and does allow people to function better inspite of back pain.   

## 2020-08-20 NOTE — Progress Notes (Signed)
Office Visit Note   Patient: Cheryl King           Date of Birth: 1965/09/30           MRN: [id number] Visit Date: 08/20/2020              Requested by: Charlott Rakes, MD [place],  [place] [address] PCP: Charlott Rakes, MD   Assessment & Plan: Visit Diagnoses:  1. Degenerative disc disease, lumbar   2. Spinal stenosis of lumbar region with neurogenic claudication   3. Radiculopathy, lumbar region   4. Trochanteric bursitis, right hip     Plan: Avoid frequent bending and stooping  No lifting greater than 10 lbs. May use ice or moist heat for pain. Weight loss is of benefit. Best medication for lumbar disc disease is arthritis medications like motrin, celebrex and naprosyn. Exercise is important to improve your indurance and does allow people to function better inspite of back pain.  Follow-Up Instructions: No follow-ups on file.   Orders:  No orders of the defined types were placed in this encounter.  No orders of the defined types were placed in this encounter.     Procedures: No procedures performed   Clinical Data: Findings:  CLINICAL DATA:  Low back pain with bilateral lower extremity weakness.   EXAM: MRI LUMBAR SPINE WITHOUT CONTRAST   TECHNIQUE: Multiplanar, multisequence MR imaging of the lumbar spine was performed. No intravenous contrast was administered.   COMPARISON:  Lumbar spine MRI 05/03/2013   FINDINGS: Segmentation:  Standard.   Alignment:  Physiologic.   Vertebrae:  No fracture, evidence of discitis, or bone lesion.   Conus medullaris and cauda equina: Conus extends to the L1 level. Conus and cauda equina appear normal.   Paraspinal and other soft tissues: Negative.   Disc levels:   T11-T12:  Minimal disc bulge without stenosis.   T12-L1:  Normal disc space without stenosis.   L1-L2: Marked worsening of degenerative disc height loss with endplate remodeling. Small central disc protrusion without  stenosis. Normal facets.   L2-L3: Marked worsening of disc height loss. Left eccentric disc bulge with mild-to-moderate narrowing of the left neural foramen. No other stenosis.   L3-L4: Worsened disc degeneration with left foraminal protrusion and annular fissure in close proximity to the exiting left L3 nerve root and causing mild foraminal stenosis. No spinal canal stenosis. Normal facets.   L4-L5: Mild disc desiccation with right foraminal disc protrusion that is worsened from the prior study. This causes severe right neural foraminal stenosis. Mild left foraminal stenosis. No spinal canal stenosis. Mild right facet hypertrophy.   L5-S1: Disc desiccation with minimal bulge.  No stenosis.   IMPRESSION: 1. L4-L5 right foraminal disc protrusion, new/worsened from the prior study, causing severe right neural foraminal stenosis and likely irritating the right L4 nerve root. 2. L2-L3 and L3-L4 worsened degenerative disc disease with left-sided bulges/protrusions causing mild-to-moderate left foraminal stenosis and possible irritation of the exiting nerve roots.     Electronically Signed   By: Ulyses Jarred M.D.   On: 01/04/2017 22:52   Subjective: Chief Complaint  Patient presents with   Lower Back - Follow-up    Pain is worsening, pain in left leg to the foot, using TENS Unit, Dr. Donovan Kail sent her to PT, says it helps some    55 year old female with continuous back pain with radiation into the legs anteriorly down the legs into the feet left greater than right.  She takes tylenol#3 and is having tingling into the back and the thighs anterior and posterior and the whole leg is painful. She has seen Dr. Donovan Kail with pain management with [facility] and she was placed into PT. Reports PT is helping alittle while there but then leaves and pain returns, she is using a TENS unit and reports the unit helps to relieve about 30-40 percent relief.   Review of Systems   Constitutional: Negative.   HENT: Negative.    Eyes: Negative.   Respiratory: Negative.    Cardiovascular: Negative.   Gastrointestinal: Negative.   Endocrine: Negative.   Genitourinary: Negative.   Musculoskeletal: Negative.   Skin: Negative.   Allergic/Immunologic: Negative.   Neurological: Negative.   Hematological: Negative.   Psychiatric/Behavioral: Negative.      Objective: Vital Signs: BP 103/62 (BP Location: Left Arm, Patient Position: Sitting)   Pulse 67   Ht '5\' 7"'$  (1.702 m)   Wt 154 lb (69.9 kg)   BMI 24.12 kg/m   Physical Exam Constitutional:      Appearance: She is well-developed.  HENT:     Head: Normocephalic and atraumatic.  Eyes:     Pupils: Pupils are equal, round, and reactive to light.  Pulmonary:     Effort: Pulmonary effort is normal.     Breath sounds: Normal breath sounds.  Abdominal:     General: Bowel sounds are normal.     Palpations: Abdomen is soft.  Musculoskeletal:     Cervical back: Normal range of motion and neck supple.     Lumbar back: Negative right straight leg raise test and negative left straight leg raise test.  Skin:    General: Skin is warm and dry.  Neurological:     Mental Status: She is alert and oriented to person, place, and time.  Psychiatric:        Behavior: Behavior normal.        Thought Content: Thought content normal.        Judgment: Judgment normal.    Back Exam   Tenderness  The patient is experiencing tenderness in the lumbar.  Range of Motion  Extension:  abnormal  Flexion:  abnormal  Lateral bend right:  abnormal  Lateral bend left:  abnormal  Rotation right:  abnormal  Rotation left:  abnormal   Muscle Strength  Right Quadriceps:  5/5  Left Quadriceps:  5/5  Right Hamstrings:  5/5  Left Hamstrings:  5/5   Tests  Straight leg raise right: negative Straight leg raise left: negative  Reflexes  Patellar:  2/4 Achilles:  2/4  Other  Toe walk: abnormal Heel walk: abnormal Sensation:  normal Gait: abnormal   Comments:  SLR negative, Uncomfortable with leg raise both sides.     Specialty Comments:  No specialty comments available.  Imaging: No results found.   PMFS History: Patient Active Problem List   Diagnosis Date Noted   Centrilobular emphysema ([place]) 07/29/2020   Multiple nodules of lung 07/29/2020   Hyperlipidemia 07/18/2017   Ulnar neuropathy 04/18/2017   GERD (gastroesophageal reflux disease) 10/19/2016   Prediabetes 05/03/2016   Cubital tunnel syndrome on right 03/01/2016   Numbness and tingling of right arm 01/01/2016   Chlamydia infection 01/27/2015   Tobacco abuse 12/26/2013   Chronic lower back pain 04/13/2013   Sciatica 04/13/2013   Chronic neck pain 04/13/2013   Spondylosis of cervical spine 04/13/2013   Past Medical History:  Diagnosis Date   Acid reflux  Allergy    Carpal tunnel syndrome    Depression    Diverticulitis    Hyperlipidemia    Low back pain    Neck pain    Sacroiliac inflammation ([facility])    Sciatica    Seasonal allergies     Family History  Problem Relation Age of Onset   Healthy Mother    Other Father        Unsure of medical history   Hypertension Maternal Grandmother    Heart Problems Maternal Grandmother    Asthma Maternal Aunt    Diabetes Maternal Aunt    Cancer Maternal Aunt    Breast cancer Maternal Aunt    Colon cancer Neg Hx    Pancreatic cancer Neg Hx    Rectal cancer Neg Hx    Stomach cancer Neg Hx    Colon polyps Neg Hx    Esophageal cancer Neg Hx     Past Surgical History:  Procedure Laterality Date   ABDOMINAL SURGERY     ANKLE SURGERY     lt.   PARTIAL HYSTERECTOMY     right breast cyst removed      URETHRAL DIVERTICULUM REPAIR     uretrral diverticultis   Social History   Occupational History   Occupation: Unemployed  Tobacco Use   Smoking status: Every Day    Packs/day: 0.50    Years: 35.00    Pack years: 17.50    Types: Cigarettes    Start date: 1981   Smokeless  tobacco: Never  Vaping Use   Vaping Use: Never used  Substance and Sexual Activity   Alcohol use: Yes    Comment: Social only - seldom   Drug use: No   Sexual activity: Never

## 2020-08-25 ENCOUNTER — Other Ambulatory Visit: Payer: Self-pay

## 2020-08-25 ENCOUNTER — Encounter: Payer: Self-pay | Admitting: Family Medicine

## 2020-08-25 ENCOUNTER — Ambulatory Visit: Payer: Medicaid Other | Attending: Family Medicine | Admitting: Family Medicine

## 2020-08-25 DIAGNOSIS — M5442 Lumbago with sciatica, left side: Secondary | ICD-10-CM | POA: Diagnosis not present

## 2020-08-25 DIAGNOSIS — G8929 Other chronic pain: Secondary | ICD-10-CM | POA: Diagnosis not present

## 2020-08-25 DIAGNOSIS — M5441 Lumbago with sciatica, right side: Secondary | ICD-10-CM

## 2020-08-25 DIAGNOSIS — G44229 Chronic tension-type headache, not intractable: Secondary | ICD-10-CM | POA: Diagnosis not present

## 2020-08-25 DIAGNOSIS — E7800 Pure hypercholesterolemia, unspecified: Secondary | ICD-10-CM

## 2020-08-25 DIAGNOSIS — N39498 Other specified urinary incontinence: Secondary | ICD-10-CM | POA: Diagnosis not present

## 2020-08-25 MED ORDER — AMITRIPTYLINE HCL 50 MG PO TABS: ORAL_TABLET | Freq: Every day | ORAL | 1 refills | Status: DC

## 2020-08-25 MED ORDER — DULOXETINE HCL 60 MG PO CPEP: ORAL_CAPSULE | Freq: Every day | ORAL | 1 refills | Status: DC

## 2020-08-25 MED ORDER — ATORVASTATIN CALCIUM 20 MG PO TABS: ORAL_TABLET | Freq: Every day | ORAL | 1 refills | Status: DC

## 2020-08-25 MED ORDER — OXYBUTYNIN CHLORIDE 5 MG PO TABS: ORAL_TABLET | Freq: Two times a day (BID) | ORAL | 1 refills | Status: DC

## 2020-08-25 MED ORDER — CYCLOBENZAPRINE HCL 10 MG PO TABS: ORAL_TABLET | Freq: Two times a day (BID) | ORAL | 1 refills | Status: DC | PRN

## 2020-08-25 NOTE — Progress Notes (Signed)
Virtual Visit via Telephone Note  I connected with Cheryl King, on 08/25/2020 at 9:21 AM by telephone due to the COVID-19 pandemic and verified that I am speaking with the correct person using two identifiers.   Consent: I discussed the limitations, risks, security and privacy concerns of performing an evaluation and management service by telephone and the availability of in person appointments. I also discussed with the patient that there may be a patient responsible charge related to this service. The patient expressed understanding and agreed to proceed.   Location of Patient: Home  Location of Provider: Clinic   Persons participating in Telemedicine visit: Cheryl King Dr. Margarita Rana     History of Present Illness: Cheryl King is a 55 year old female with Medical history  significant for Prediabetes (diet controlled A1c 5.6) chronic low back pain with sciatica, GERD, hyperlipidemia, tobacco abuse here for a follow-up visit.  Seen by [facility] management and was referred for PT.  Also being followed by her spine doctor Dr. Louanne Skye who has ordered an MRI for further evaluation of her back pain which is currently uncontrolled. She keeps herself busy by gardening but after that has back pain as a result.  Currently working on her disability.  Her headaches are controlled and she is doing well on her statin.  Her Prediabetes is diet controlled. She has no additional concerns today.  Past Medical History:  Diagnosis Date   Acid reflux    Allergy    Carpal tunnel syndrome    Depression    Diverticulitis    Hyperlipidemia    Low back pain    Neck pain    Sacroiliac inflammation (HCC)    Sciatica    Seasonal allergies    Allergies  Allergen Reactions   Latex Other (See Comments)    Pt unsure of reaction   Percocet [Oxycodone-Acetaminophen] Nausea Only   Vicodin [Hydrocodone-Acetaminophen] Nausea Only   Penicillins Rash    Current Outpatient  Medications on File Prior to Visit  Medication Sig Dispense Refill   Acetaminophen-Codeine 300-30 MG tablet Take 1 tablet by mouth 2 (two) times daily as needed. for pain 30 tablet 0   albuterol (VENTOLIN HFA) 108 (90 Base) MCG/ACT inhaler INHALE 2 PUFFS INTO THE LUNGS EVERY 6 (SIX) HOURS AS NEEDED FOR WHEEZING OR SHORTNESS OF BREATH. (Patient not taking: Reported on 08/05/2020) 8.5 g 1   amitriptyline (ELAVIL) 50 MG tablet TAKE 1 TABLET (50 MG TOTAL) BY MOUTH AT BEDTIME. 30 tablet 6   atorvastatin (LIPITOR) 20 MG tablet TAKE 1 TABLET (20 MG TOTAL) BY MOUTH DAILY. 30 tablet 6   cetirizine (ZYRTEC) 10 MG tablet Take 1 tablet (10 mg total) by mouth daily. 90 tablet 1   cyclobenzaprine (FLEXERIL) 10 MG tablet TAKE 1 TABLET (10 MG TOTAL) BY MOUTH 2 (TWO) TIMES DAILY AS NEEDED FOR MUSCLE SPASMS. 60 tablet 6   diclofenac (VOLTAREN) 0.1 % ophthalmic solution Place 1 drop into both eyes 4 (four) times daily. 5 mL 0   diclofenac sodium (VOLTAREN) 1 % GEL Apply 2 g topically 4 (four) times daily. 5 Tube 2   DULoxetine (CYMBALTA) 60 MG capsule TAKE 1 CAPSULE (60 MG TOTAL) BY MOUTH DAILY. 30 capsule 6   fluticasone (FLONASE) 50 MCG/ACT nasal spray PLACE 2 SPRAYS INTO BOTH NOSTRILS DAILY. 16 g 2   Lactobacillus CAPS Take 1 capsule by mouth daily.     lidocaine (LIDODERM) 5 % PLACE 1 PATCH ONTO THE SKIN DAILY. REMOVE & DISCARD PATCH WITHIN 12  HOURS OR AS DIRECTED BY MD 30 patch 6   Multiple Vitamin (MULTIVITAMIN WITH MINERALS) TABS tablet Take 1 tablet by mouth daily.     naproxen (NAPROSYN) 500 MG tablet TAKE 1 TABLET BY MOUTH TWO TIMES DAILY WITH MEALS 60 tablet 3   nicotine (NICODERM CQ - DOSED IN MG/24 HOURS) 21 mg/24hr patch PLACE 1 PATCH (21 MG TOTAL) ONTO THE SKIN DAILY. FOR 4 WEEKS THEN '7MG'$ /24HR DAILY FOR 4 WEEKS 28 patch 2   nitrofurantoin, macrocrystal-monohydrate, (MACROBID) 100 MG capsule Take 1 capsule (100 mg total) by mouth 2 (two) times daily. (Patient not taking: Reported on 08/05/2020) 10 capsule  0   oxybutynin (DITROPAN) 5 MG tablet TAKE 1 TABLET (5 MG TOTAL) BY MOUTH 2 (TWO) TIMES DAILY. 60 tablet 6   phenazopyridine (PYRIDIUM) 200 MG tablet Take 1 tablet (200 mg total) by mouth 3 (three) times daily as needed for pain. (Patient not taking: Reported on 08/05/2020) 6 tablet 0   pregabalin (LYRICA) 75 MG capsule Take 1 capsule (75 mg total) by mouth 2 (two) times daily. 60 capsule 5   RESTASIS 0.05 % ophthalmic emulsion   3   triamcinolone cream (KENALOG) 0.1 % Apply 1 application topically 2 (two) times daily. 45 g 1   trimethoprim-polymyxin b (POLYTRIM) ophthalmic solution Place 1 drop into both eyes every 4 (four) hours. 10 mL 0   umeclidinium-vilanterol (ANORO ELLIPTA) 62.5-25 MCG/INH AEPB Inhale 1 puff into the lungs daily. 60 each 5   Current Facility-Administered Medications on File Prior to Visit  Medication Dose Route Frequency Provider Last Rate Last Admin   0.9 %  sodium chloride infusion  500 mL Intravenous Once Nelida Meuse III, MD        ROS: See HPI  Observations/Objective: Awake, alert, oriented x3 Not in acute distress Normal mood   Lab Results  Component Value Date   HGBA1C 5.6 (A) 07/04/2019   CMP Latest Ref Rng & Units 01/28/2020 07/04/2019 09/20/2018  Glucose 65 - 99 mg/dL 84 86 92  BUN 6 - 24 mg/dL '22 23 17  '$ Creatinine 0.57 - 1.00 mg/dL 0.79 0.82 0.72  Sodium 134 - 144 mmol/L 138 139 141  Potassium 3.5 - 5.2 mmol/L 4.4 4.9 4.9  Chloride 96 - 106 mmol/L 102 101 102  CO2 20 - 29 mmol/L '23 26 27  '$ Calcium 8.7 - 10.2 mg/dL 9.5 9.8 9.7  Total Protein 6.0 - 8.5 g/dL 6.9 7.3 6.7  Total Bilirubin 0.0 - 1.2 mg/dL 0.4 0.3 0.3  Alkaline Phos 44 - 121 IU/L 106 108 113  AST 0 - 40 IU/L '22 22 28  '$ ALT 0 - 32 IU/L '16 12 27    '$ Lipid Panel     Component Value Date/Time   CHOL 191 01/28/2020 1012   TRIG 74 01/28/2020 1012   HDL 60 01/28/2020 1012   CHOLHDL 3.2 01/28/2020 1012   CHOLHDL 3.2 12/26/2013 1319   VLDL 18 12/26/2013 1319   LDLCALC 117 (H)  01/28/2020 1012   LABVLDL 14 01/28/2020 1012    Lab Results  Component Value Date   HGBA1C 5.6 (A) 07/04/2019     Assessment and Plan: 1. Chronic tension-type headache, not intractable Controlled - amitriptyline (ELAVIL) 50 MG tablet; TAKE 1 TABLET (50 MG TOTAL) BY MOUTH AT BEDTIME.  Dispense: 90 tablet; Refill: 1  2. Pure hypercholesterolemia Controlled with A1c of 5.6 Low-cholesterol diet - atorvastatin (LIPITOR) 20 MG tablet; TAKE 1 TABLET (20 MG TOTAL) BY MOUTH DAILY.  Dispense: 90  tablet; Refill: 1  3. Chronic midline low back pain with bilateral sciatica Uncontrolled Currently undergoing PT Follow-up with pain management and orthopedic spine - cyclobenzaprine (FLEXERIL) 10 MG tablet; TAKE 1 TABLET (10 MG TOTAL) BY MOUTH 2 (TWO) TIMES DAILY AS NEEDED FOR MUSCLE SPASMS.  Dispense: 180 tablet; Refill: 1 - DULoxetine (CYMBALTA) 60 MG capsule; TAKE 1 CAPSULE (60 MG TOTAL) BY MOUTH DAILY.  Dispense: 90 capsule; Refill: 1  4. Other urinary incontinence Stable - oxybutynin (DITROPAN) 5 MG tablet; TAKE 1 TABLET (5 MG TOTAL) BY MOUTH 2 (TWO) TIMES DAILY.  Dispense: 180 tablet; Refill: 1   Follow Up Instructions: 3 months   I discussed the assessment and treatment plan with the patient. The patient was provided an opportunity to ask questions and all were answered. The patient agreed with the plan and demonstrated an understanding of the instructions.   The patient was advised to call back or seek an in-person evaluation if the symptoms worsen or if the condition fails to improve as anticipated.     I provided 14 minutes total of non-face-to-face time during this encounter.   Charlott Rakes, MD, FAAFP. [facility], [place]   08/25/2020, 9:21 AM

## 2020-08-26 ENCOUNTER — Other Ambulatory Visit: Payer: Medicaid Other

## 2020-08-26 ENCOUNTER — Ambulatory Visit: Payer: Medicaid Other

## 2020-08-26 ENCOUNTER — Other Ambulatory Visit: Payer: Self-pay

## 2020-08-26 DIAGNOSIS — M6281 Muscle weakness (generalized): Secondary | ICD-10-CM

## 2020-08-26 DIAGNOSIS — M25551 Pain in right hip: Secondary | ICD-10-CM | POA: Diagnosis not present

## 2020-08-26 DIAGNOSIS — G8929 Other chronic pain: Secondary | ICD-10-CM | POA: Diagnosis not present

## 2020-08-26 DIAGNOSIS — M62830 Muscle spasm of back: Secondary | ICD-10-CM | POA: Diagnosis not present

## 2020-08-26 DIAGNOSIS — M25552 Pain in left hip: Secondary | ICD-10-CM

## 2020-08-26 DIAGNOSIS — M5442 Lumbago with sciatica, left side: Secondary | ICD-10-CM

## 2020-08-26 DIAGNOSIS — M5441 Lumbago with sciatica, right side: Secondary | ICD-10-CM | POA: Diagnosis not present

## 2020-08-26 DIAGNOSIS — R2689 Other abnormalities of gait and mobility: Secondary | ICD-10-CM | POA: Diagnosis not present

## 2020-08-26 DIAGNOSIS — R262 Difficulty in walking, not elsewhere classified: Secondary | ICD-10-CM | POA: Diagnosis not present

## 2020-08-26 NOTE — Therapy (Signed)
[place], [place], [address] Phone: [phone]   Fax:  [phone]  Physical Therapy Treatment  Patient Details  Name: Cheryl King MRN: [id number] Date of Birth: Jan 01, 1966 Referring Provider (PT): Jeanella Anton, NP   Encounter Date: 08/26/2020   PT End of Session - 08/26/20 0720     Visit Number 5    Number of Visits 9    Date for PT Re-Evaluation 09/09/20    Authorization Type [place] MEDICAID HEALTHY BLUE    PT Start Time 0718    PT Stop Time 0800    PT Time Calculation (min) 42 min    Activity Tolerance Patient tolerated treatment well    Behavior During Therapy [facility] for tasks assessed/performed             Past Medical History:  Diagnosis Date   Acid reflux    Allergy    Carpal tunnel syndrome    Depression    Diverticulitis    Hyperlipidemia    Low back pain    Neck pain    Sacroiliac inflammation ([place])    Sciatica    Seasonal allergies     Past Surgical History:  Procedure Laterality Date   ABDOMINAL SURGERY     ANKLE SURGERY     lt.   PARTIAL HYSTERECTOMY     right breast cyst removed      URETHRAL DIVERTICULUM REPAIR     uretrral diverticultis    There were no vitals filed for this visit.   Subjective Assessment - 08/26/20 0725     Subjective Pt reports seing Dr. Louanne Skye on 8/3 and he has ordered a MRI. Pt also reports he is sending a referral ofr hip bursitis. Pt reports waking up c 9/10 level back pain. Pt applied the TENs unit which resolved her back and hip pain. Pt reports wearing more consistently. pt reports she is currently not having hip pain.    Diagnostic tests Ap and lateral flexion and extension radiographs demonstrate degenerative   disc narrowing L1-2, L2-3, L3-4 and L5-S1 with vacuum sign at L5-S1 and   retrolisthesis of L2-3 and L5-S1 anterior disc osteophytes at L2-3 and   L5-S1 with end plate sclerosis and cystic changes of the endplates at both    [id number] and L5-S1. The degenerative changes appear to have progressed since   the last radiograph from 01/2019.    Patient Stated Goals To control my pain better    Currently in Pain? Yes    Pain Score 0-No pain   with TENs applied   Pain Location Back    Pain Orientation Right;Left;Posterior    Pain Descriptors / Indicators Aching    Pain Type Chronic pain    Pain Onset More than a month ago    Pain Frequency Constant    Multiple Pain Sites No                               [facility] Adult PT Treatment/Exercise - 08/26/20 0001       Lumbar Exercises: Stretches   Lower Trunk Rotation 5 reps   5 sec   Piriformis Stretch Right;Left;2 reps;20 seconds      Lumbar Exercises: Supine   Clam 10 reps    Clam Limitations green Tband    Bent Knee Raise 10 reps   2 sets   Bent Knee Raise Limitations c PPT    Bridge 10  reps;2 seconds   2 sets   Bridge Limitations c PPT    Other Supine Lumbar Exercises hip add sets c ball 15x, 3 sec                    PT Education - 08/26/20 0810     Education Details Education on proper body mechanics for household activiies to minimize back strain    [employer]) Educated Patient    Methods Explanation;Demonstration;Tactile cues;Verbal cues    Comprehension Verbalized understanding              PT Short Term Goals - 08/26/20 0823       PT SHORT TERM GOAL #1   Title Pt will be Ind in an initial HEP    Status Achieved    Target Date 08/26/20      PT SHORT TERM GOAL #2   Title Pt will voice understanding of measures to assist pain reduction and management. Pt [place] using a TENS unit and completing a HEP which the pt reports are helpful with reducing her pain.    Status Achieved               PT Long Term Goals - 08/05/20 1629       PT LONG TERM GOAL #1   Title Pt will report improvement with chronic low back and L LE pain to 4/10 or less with daily activities    Status New    Target Date 09/06/20      PT LONG  TERM GOAL #2   Title Pt's lumbar movement will all improve 25% greater than her baseline on eval    Baseline see flowsheets    Status New    Target Date 09/06/20      PT LONG TERM GOAL #3   Title Pt will demonstrate proper body mechanics for household activities to minimize low back strain.    Target Date 09/02/20                   Plan - 08/26/20 0720     Clinical Impression Statement Pt was minimally TTP of the L lateral hip. The R lateral hip was not TTP. With use of the TENS unit, pt is experiencing good pain relief. Provided education re: proper body mechanics to minimize low back strain with household activities. Pt voice understanding. PT was provided for lumbopelvic/hip flexibility and strengthening. Pt tolerated the PT session without adverse effects.    Personal Factors and Comorbidities Time since onset of injury/illness/exacerbation    Examination-Activity Limitations Locomotion Level;Stand;Lift;Bend;Transfers    Stability/Clinical Decision Making Evolving/Moderate complexity    Clinical Decision Making Moderate    Rehab Potential Fair    PT Frequency 2x / week    PT Treatment/Interventions ADLs/Self Care Home Management;Aquatic Therapy;Cryotherapy;Electrical Stimulation;Moist Heat;Traction;Therapeutic activities;Therapeutic exercise;Manual techniques;Patient/family education;Passive range of motion;Dry needling;Taping;Spinal Manipulations    PT Next Visit Plan Progress strength for the low back and hips. Continue education re: proper body mechanics for for household activities.    PT Home Exercise Plan [id number]    Consulted and Agree with Plan of Care Patient             Patient will benefit from skilled therapeutic intervention in order to improve the following deficits and impairments:  Abnormal gait, Decreased range of motion, Difficulty walking, Increased muscle spasms, Decreased activity tolerance, Pain, Decreased strength  Visit Diagnosis: Chronic low  back pain with left-sided sciatica, unspecified back pain laterality  Difficulty in  walking, not elsewhere classified  Muscle weakness (generalized)  Pain in left hip  Pain in right hip     Problem List Patient Active Problem List   Diagnosis Date Noted   Centrilobular emphysema ([place]) 07/29/2020   Multiple nodules of lung 07/29/2020   Hyperlipidemia 07/18/2017   Ulnar neuropathy 04/18/2017   GERD (gastroesophageal reflux disease) 10/19/2016   Prediabetes 05/03/2016   Cubital tunnel syndrome on right 03/01/2016   Numbness and tingling of right arm 01/01/2016   Chlamydia infection 01/27/2015   Tobacco abuse 12/26/2013   Chronic lower back pain 04/13/2013   Sciatica 04/13/2013   Chronic neck pain 04/13/2013   Spondylosis of cervical spine 04/13/2013   Gar Ponto MS, PT 08/26/20 8:27 AM   [facility] [address] [place], [place], [address] Phone: [phone]   Fax:  [phone]  Name: Cheryl King MRN: [id number] Date of Birth: September 24, 1965

## 2020-08-27 ENCOUNTER — Other Ambulatory Visit: Payer: Self-pay

## 2020-08-27 ENCOUNTER — Ambulatory Visit: Payer: Medicaid Other | Admitting: Family Medicine

## 2020-08-28 ENCOUNTER — Other Ambulatory Visit: Payer: Self-pay

## 2020-08-28 ENCOUNTER — Ambulatory Visit: Payer: Medicaid Other

## 2020-08-28 DIAGNOSIS — M5441 Lumbago with sciatica, right side: Secondary | ICD-10-CM | POA: Diagnosis not present

## 2020-08-28 DIAGNOSIS — R2689 Other abnormalities of gait and mobility: Secondary | ICD-10-CM

## 2020-08-28 DIAGNOSIS — M62830 Muscle spasm of back: Secondary | ICD-10-CM | POA: Diagnosis not present

## 2020-08-28 DIAGNOSIS — M25552 Pain in left hip: Secondary | ICD-10-CM | POA: Diagnosis not present

## 2020-08-28 DIAGNOSIS — G8929 Other chronic pain: Secondary | ICD-10-CM | POA: Diagnosis not present

## 2020-08-28 DIAGNOSIS — M6281 Muscle weakness (generalized): Secondary | ICD-10-CM

## 2020-08-28 DIAGNOSIS — R262 Difficulty in walking, not elsewhere classified: Secondary | ICD-10-CM

## 2020-08-28 DIAGNOSIS — M5442 Lumbago with sciatica, left side: Secondary | ICD-10-CM | POA: Diagnosis not present

## 2020-08-28 DIAGNOSIS — M25551 Pain in right hip: Secondary | ICD-10-CM | POA: Diagnosis not present

## 2020-08-28 NOTE — Therapy (Signed)
[place], [place], [address] Phone: [phone]   Fax:  [phone]  Physical Therapy Treatment  Patient Details  Name: Cheryl King MRN: [id number] Date of Birth: 1965-04-18 Referring Provider (PT): Jeanella Anton, NP   Encounter Date: 08/28/2020   PT End of Session - 08/28/20 0725     Visit Number 6    Number of Visits 9    Date for PT Re-Evaluation 09/09/20    Authorization Type [place] MEDICAID HEALTHY BLUE    PT Start Time 0718    PT Stop Time 0800    PT Time Calculation (min) 42 min    Activity Tolerance Patient tolerated treatment well    Behavior During Therapy [facility] for tasks assessed/performed             Past Medical History:  Diagnosis Date   Acid reflux    Allergy    Carpal tunnel syndrome    Depression    Diverticulitis    Hyperlipidemia    Low back pain    Neck pain    Sacroiliac inflammation ([place])    Sciatica    Seasonal allergies     Past Surgical History:  Procedure Laterality Date   ABDOMINAL SURGERY     ANKLE SURGERY     lt.   PARTIAL HYSTERECTOMY     right breast cyst removed      URETHRAL DIVERTICULUM REPAIR     uretrral diverticultis    There were no vitals filed for this visit.   Subjective Assessment - 08/28/20 0719     Subjective My pain comes back when I take the TENS unit off. The exercise help my pain when i do them. Pt reports limited completion of exs at home.    Diagnostic tests Ap and lateral flexion and extension radiographs demonstrate degenerative   disc narrowing L1-2, L2-3, L3-4 and L5-S1 with vacuum sign at L5-S1 and   retrolisthesis of L2-3 and L5-S1 anterior disc osteophytes at L2-3 and   L5-S1 with end plate sclerosis and cystic changes of the endplates at both   [id number] and L5-S1. The degenerative changes appear to have progressed since   the last radiograph from 01/2019.    Patient Stated Goals To control my pain better    Currently in  Pain? Yes    Pain Score 9     Pain Location Back    Pain Orientation Right;Left;Posterior    Pain Descriptors / Indicators Aching    Pain Type Chronic pain    Pain Onset More than a month ago    Pain Frequency Constant    Aggravating Factors  Pain increases with increased activity    Pain Relieving Factors Predisone    Multiple Pain Sites No                               [facility] Adult PT Treatment/Exercise - 08/28/20 0001       Lumbar Exercises: Stretches   Lower Trunk Rotation 5 reps   5 sec   Hip Flexor Stretch Right;Left;2 reps;20 seconds    Other Lumbar Stretch Exercise Seated trunk flexion stretch, forward and laterally 3x, 15 sec      Lumbar Exercises: Standing   Lifting Limitations Hinged hip lifts c 5lbs, 10x2    Forward Lunge 10 reps    Forward Lunge Limitations L and R; partial  PT Education - 08/28/20 0730     Education Details Continued education for proper body mechanics with householde activities with emphasis on lunges for vacuuming and hinge hip lifts. HEP update.    Person(s) Educated Patient    Methods Explanation;Demonstration    Comprehension Verbalized understanding;Returned demonstration              PT Short Term Goals - 08/26/20 0823       PT SHORT TERM GOAL #1   Title Pt will be Ind in an initial HEP    Status Achieved    Target Date 08/26/20      PT SHORT TERM GOAL #2   Title Pt will voice understanding of measures to assist pain reduction and management. Pt [place] using a TENS unit and completing a HEP which the pt reports are helpful with reducing her pain.    Status Achieved               PT Long Term Goals - 08/05/20 1629       PT LONG TERM GOAL #1   Title Pt will report improvement with chronic low back and L LE pain to 4/10 or less with daily activities    Status New    Target Date 09/06/20      PT LONG TERM GOAL #2   Title Pt's lumbar movement will all improve 25% greater  than her baseline on eval    Baseline see flowsheets    Status New    Target Date 09/06/20      PT LONG TERM GOAL #3   Title Pt will demonstrate proper body mechanics for household activities to minimize low back strain.    Target Date 09/02/20                   Plan - 08/28/20 0806     Clinical Impression Statement Pt completed stetching ther ex c hip flexor stretch added. Continued education re: proper body mechnaics for household activities. Pt completed ther ex to mimic the physical demands and proper technique of vacuuming and hinged hip lifting. Pt returned proper demonstration. Following the PT session, the pt reported her low back pain was a 7/10. Pt was encourgaed to complete her HEP which she reports provides her some relief of low back pain. Encouraged pt to use the TENS for less pain and improve activity, but not to overdue with the pain being control by pain.    Personal Factors and Comorbidities Time since onset of injury/illness/exacerbation    Examination-Activity Limitations Locomotion Level;Stand;Lift;Bend;Transfers    Stability/Clinical Decision Making Evolving/Moderate complexity    Clinical Decision Making Moderate    Rehab Potential Good    PT Frequency 2x / week    PT Duration 4 weeks    PT Treatment/Interventions ADLs/Self Care Home Management;Aquatic Therapy;Cryotherapy;Electrical Stimulation;Moist Heat;Traction;Therapeutic activities;Therapeutic exercise;Manual techniques;Patient/family education;Passive range of motion;Dry needling;Taping;Spinal Manipulations    PT Next Visit Plan Progress strength for the low back and hips. Continue education re: proper body mechanics for for household activities.    PT Home Exercise Plan [id number]    Consulted and Agree with Plan of Care Patient             Patient will benefit from skilled therapeutic intervention in order to improve the following deficits and impairments:  Abnormal gait, Decreased range of  motion, Difficulty walking, Increased muscle spasms, Decreased activity tolerance, Pain, Decreased strength  Visit Diagnosis: Chronic low back pain with left-sided sciatica, unspecified back pain  laterality  Difficulty in walking, not elsewhere classified  Muscle weakness (generalized)  Muscle spasm of back  Other abnormalities of gait and mobility     Problem List Patient Active Problem List   Diagnosis Date Noted   Centrilobular emphysema ([place]) 07/29/2020   Multiple nodules of lung 07/29/2020   Hyperlipidemia 07/18/2017   Ulnar neuropathy 04/18/2017   GERD (gastroesophageal reflux disease) 10/19/2016   Prediabetes 05/03/2016   Cubital tunnel syndrome on right 03/01/2016   Numbness and tingling of right arm 01/01/2016   Chlamydia infection 01/27/2015   Tobacco abuse 12/26/2013   Chronic lower back pain 04/13/2013   Sciatica 04/13/2013   Chronic neck pain 04/13/2013   Spondylosis of cervical spine 04/13/2013    Gar Ponto MS, PT 08/28/20 8:19 AM    [facility] [address] [place], [place], [address] Phone: [phone]   Fax:  [phone]  Name: Cheryl King MRN: [id number] Date of Birth: [birth year]

## 2020-09-02 ENCOUNTER — Ambulatory Visit: Payer: Medicaid Other

## 2020-09-03 ENCOUNTER — Other Ambulatory Visit: Payer: Self-pay

## 2020-09-03 ENCOUNTER — Ambulatory Visit
Admission: RE | Admit: 2020-09-03 | Discharge: 2020-09-03 | Disposition: A | Discharge status: Home / Self Care | Payer: Medicaid Other | Source: Ambulatory Visit | Attending: Pulmonary Disease | Admitting: Pulmonary Disease

## 2020-09-03 DIAGNOSIS — R918 Other nonspecific abnormal finding of lung field: Secondary | ICD-10-CM

## 2020-09-04 ENCOUNTER — Ambulatory Visit: Payer: Medicaid Other

## 2020-09-04 DIAGNOSIS — G8929 Other chronic pain: Secondary | ICD-10-CM | POA: Diagnosis not present

## 2020-09-04 DIAGNOSIS — R2689 Other abnormalities of gait and mobility: Secondary | ICD-10-CM | POA: Diagnosis not present

## 2020-09-04 DIAGNOSIS — M62830 Muscle spasm of back: Secondary | ICD-10-CM

## 2020-09-04 DIAGNOSIS — R262 Difficulty in walking, not elsewhere classified: Secondary | ICD-10-CM | POA: Diagnosis not present

## 2020-09-04 DIAGNOSIS — M25552 Pain in left hip: Secondary | ICD-10-CM | POA: Diagnosis not present

## 2020-09-04 DIAGNOSIS — M6281 Muscle weakness (generalized): Secondary | ICD-10-CM | POA: Diagnosis not present

## 2020-09-04 DIAGNOSIS — M25551 Pain in right hip: Secondary | ICD-10-CM | POA: Diagnosis not present

## 2020-09-04 DIAGNOSIS — M5442 Lumbago with sciatica, left side: Secondary | ICD-10-CM

## 2020-09-04 DIAGNOSIS — M5441 Lumbago with sciatica, right side: Secondary | ICD-10-CM | POA: Diagnosis not present

## 2020-09-04 NOTE — Therapy (Signed)
[place], [place], [address] Phone: [phone]   Fax:  [phone]  Physical Therapy Treatment  Patient Details  Name: Cheryl King MRN: [id number] Date of Birth: 1965/07/15 Referring Provider (PT): Jeanella Anton, NP   Encounter Date: 09/04/2020   PT End of Session - 09/04/20 0734     Visit Number 7    Number of Visits 9    Date for PT Re-Evaluation 09/09/20    Authorization Type [place] MEDICAID HEALTHY BLUE    PT Start Time 0715    PT Stop Time 0800    PT Time Calculation (min) 45 min    Activity Tolerance Patient tolerated treatment well    Behavior During Therapy [facility] for tasks assessed/performed             Past Medical History:  Diagnosis Date   Acid reflux    Allergy    Carpal tunnel syndrome    Depression    Diverticulitis    Hyperlipidemia    Low back pain    Neck pain    Sacroiliac inflammation ([place])    Sciatica    Seasonal allergies     Past Surgical History:  Procedure Laterality Date   ABDOMINAL SURGERY     ANKLE SURGERY     lt.   PARTIAL HYSTERECTOMY     right breast cyst removed      URETHRAL DIVERTICULUM REPAIR     uretrral diverticultis    There were no vitals filed for this visit.   Subjective Assessment - 09/04/20 0720     Subjective Pt reports she has a MRI for her back on 09/11/20. My thighs were sore after the the last PT session. Pt returns to see Dr. Louanne Skye in 09/26/20.    Diagnostic tests Ap and lateral flexion and extension radiographs demonstrate degenerative   disc narrowing L1-2, L2-3, L3-4 and L5-S1 with vacuum sign at L5-S1 and   retrolisthesis of L2-3 and L5-S1 anterior disc osteophytes at L2-3 and   L5-S1 with end plate sclerosis and cystic changes of the endplates at both   [id number] and L5-S1. The degenerative changes appear to have progressed since   the last radiograph from 01/2019.    Patient Stated Goals To control my pain better    Currently in  Pain? Yes    Pain Score 7     Pain Location Back    Pain Orientation Right;Left;Posterior    Pain Descriptors / Indicators Aching    Pain Type Chronic pain    Pain Onset More than a month ago    Pain Frequency Constant    Multiple Pain Sites Yes    Pain Score 6    Pain Location Hip    Pain Orientation Right    Pain Descriptors / Indicators Aching    Pain Type Chronic pain    Pain Onset More than a month ago    Pain Frequency Intermittent    Aggravating Factors  Sleeping                               [facility] Adult PT Treatment/Exercise - 09/04/20 0001       Exercises   Exercises Lumbar      Lumbar Exercises: Stretches   ITB Stretch Right;Left;2 reps;20 seconds    ITB Stretch Limitations standing    Piriformis Stretch Right;Left;2 reps;20 seconds    Other Lumbar Stretch Exercise Side lying  Ober test- pt was not able to tolerate preesure on her mat side hip      Lumbar Exercises: Aerobic   Nustep 6 min, L5; UEs/LEs      Lumbar Exercises: Supine   Pelvic Tilt 10 reps    Clam 10 reps   2 sets; single leg   Clam Limitations green Tband    Bridge 10 reps;2 seconds   2 sets   Bridge Limitations c PPT                      PT Short Term Goals - 08/26/20 0823       PT SHORT TERM GOAL #1   Title Pt will be Ind in an initial HEP    Status Achieved    Target Date 08/26/20      PT SHORT TERM GOAL #2   Title Pt will voice understanding of measures to assist pain reduction and management. Pt [place] using a TENS unit and completing a HEP which the pt reports are helpful with reducing her pain.    Status Achieved               PT Long Term Goals - 08/05/20 1629       PT LONG TERM GOAL #1   Title Pt will report improvement with chronic low back and L LE pain to 4/10 or less with daily activities    Status New    Target Date 09/06/20      PT LONG TERM GOAL #2   Title Pt's lumbar movement will all improve 25% greater than her baseline on  eval    Baseline see flowsheets    Status New    Target Date 09/06/20      PT LONG TERM GOAL #3   Title Pt will demonstrate proper body mechanics for household activities to minimize low back strain.    Target Date 09/02/20                   Plan - 09/04/20 0737     Clinical Impression Statement Pt received a new referral for hip bursitis from Dr. Louanne Skye. PT was provided for hip flexibility with piriformis and ITB stretches. PT was continued for lumbopelvic/hip strengthening. Pt reports she is [employer] officer recommendations. Pt's back pain has not significant improved. it is managed with the TENs unit and varies based on activity level. Re-assess next PT visit.    Personal Factors and Comorbidities Time since onset of injury/illness/exacerbation    Examination-Activity Limitations Locomotion Level;Stand;Lift;Bend;Transfers    Stability/Clinical Decision Making Evolving/Moderate complexity    Clinical Decision Making Moderate    Rehab Potential Good    PT Frequency 2x / week    PT Duration 4 weeks    PT Treatment/Interventions ADLs/Self Care Home Management;Aquatic Therapy;Cryotherapy;Electrical Stimulation;Moist Heat;Traction;Therapeutic activities;Therapeutic exercise;Manual techniques;Patient/family education;Passive range of motion;Dry needling;Taping;Spinal Manipulations    PT Next Visit Plan Progress strength for the low back and hips. Continue education re: proper body mechanics for for household activities.    PT Home Exercise Plan [id number]    Consulted and Agree with Plan of Care Patient             Patient will benefit from skilled therapeutic intervention in order to improve the following deficits and impairments:  Abnormal gait, Decreased range of motion, Difficulty walking, Increased muscle spasms, Decreased activity tolerance, Pain, Decreased strength  Visit Diagnosis: Chronic low back pain with left-sided sciatica, unspecified back pain  laterality  Difficulty in walking, not elsewhere classified  Muscle spasm of back  Other abnormalities of gait and mobility     Problem List Patient Active Problem List   Diagnosis Date Noted   Centrilobular emphysema ([place]) 07/29/2020   Multiple nodules of lung 07/29/2020   Hyperlipidemia 07/18/2017   Ulnar neuropathy 04/18/2017   GERD (gastroesophageal reflux disease) 10/19/2016   Prediabetes 05/03/2016   Cubital tunnel syndrome on right 03/01/2016   Numbness and tingling of right arm 01/01/2016   Chlamydia infection 01/27/2015   Tobacco abuse 12/26/2013   Chronic lower back pain 04/13/2013   Sciatica 04/13/2013   Chronic neck pain 04/13/2013   Spondylosis of cervical spine 04/13/2013   Gar Ponto MS, PT 09/04/20 9:35 AM   [facility] [address] [place], [place], [address] Phone: [phone]   Fax:  [phone]  Name: Donnika Padmanabhan MRN: [id number] Date of Birth: [birth year]

## 2020-09-09 ENCOUNTER — Ambulatory Visit: Payer: Medicaid Other

## 2020-09-09 NOTE — Progress Notes (Signed)
Called pt and there was no answer-LMTCB °

## 2020-09-10 ENCOUNTER — Telehealth: Payer: Self-pay | Admitting: Acute Care

## 2020-09-10 NOTE — Telephone Encounter (Signed)
Patient is returning phone call. Patient phone number is [phone].

## 2020-09-10 NOTE — Telephone Encounter (Signed)
ATC Patient.  LM to call back for CT results.

## 2020-09-10 NOTE — Telephone Encounter (Signed)
Called and spoke with Patient.  Dr. Bari Mantis results and recommendations given.  Understanding stated.  Nothing further at this time.   Rigoberto Noel, MD  09/04/2020  9:36 AM EDT     Changes of emphysema. Nodule at 5 mm is unchanged and considered benign Evidence of increased pressure in the lungs/hypertension. Main advise is that she has to quit smoking

## 2020-09-11 ENCOUNTER — Other Ambulatory Visit: Payer: Medicaid Other

## 2020-09-12 ENCOUNTER — Encounter: Payer: Self-pay | Admitting: Family Medicine

## 2020-09-15 ENCOUNTER — Other Ambulatory Visit: Payer: Self-pay | Admitting: Family Medicine

## 2020-09-15 DIAGNOSIS — E7800 Pure hypercholesterolemia, unspecified: Secondary | ICD-10-CM

## 2020-09-16 ENCOUNTER — Other Ambulatory Visit: Payer: Self-pay

## 2020-09-16 ENCOUNTER — Ambulatory Visit: Payer: Medicaid Other

## 2020-09-16 DIAGNOSIS — R262 Difficulty in walking, not elsewhere classified: Secondary | ICD-10-CM

## 2020-09-16 DIAGNOSIS — M6281 Muscle weakness (generalized): Secondary | ICD-10-CM | POA: Diagnosis not present

## 2020-09-16 DIAGNOSIS — M5442 Lumbago with sciatica, left side: Secondary | ICD-10-CM | POA: Diagnosis not present

## 2020-09-16 DIAGNOSIS — R2689 Other abnormalities of gait and mobility: Secondary | ICD-10-CM | POA: Diagnosis not present

## 2020-09-16 DIAGNOSIS — M5441 Lumbago with sciatica, right side: Secondary | ICD-10-CM | POA: Diagnosis not present

## 2020-09-16 DIAGNOSIS — M25552 Pain in left hip: Secondary | ICD-10-CM

## 2020-09-16 DIAGNOSIS — M62830 Muscle spasm of back: Secondary | ICD-10-CM

## 2020-09-16 DIAGNOSIS — G8929 Other chronic pain: Secondary | ICD-10-CM | POA: Diagnosis not present

## 2020-09-16 DIAGNOSIS — M25551 Pain in right hip: Secondary | ICD-10-CM

## 2020-09-16 NOTE — Addendum Note (Signed)
Addended by: Gar Ponto on: 09/16/2020 11:45 AM   Modules accepted: Orders

## 2020-09-16 NOTE — Therapy (Addendum)
[place], [place], [address] Phone: [phone]   Fax:  [phone]  Physical Therapy Treatment/ERO/Re-Auth/DIscharge  Patient Details  Name: Cheryl King MRN: [id number] Date of Birth: 21-Apr-1965 Referring Provider (PT): Jeanella Anton, NP   Encounter Date: 09/16/2020   PT End of Session - 09/16/20 1043     Visit Number 8    Number of Visits 18    Date for PT Re-Evaluation 10/25/20    Authorization Type [place] MEDICAID HEALTHY BLUE    Progress Note Due on Visit 10    PT Start Time 0845    PT Stop Time 0930    PT Time Calculation (min) 45 min    Activity Tolerance Patient tolerated treatment well    Behavior During Therapy [facility] for tasks assessed/performed             Past Medical History:  Diagnosis Date   Acid reflux    Allergy    Carpal tunnel syndrome    Depression    Diverticulitis    Hyperlipidemia    Low back pain    Neck pain    Sacroiliac inflammation ([place])    Sciatica    Seasonal allergies     Past Surgical History:  Procedure Laterality Date   ABDOMINAL SURGERY     ANKLE SURGERY     lt.   PARTIAL HYSTERECTOMY     right breast cyst removed      URETHRAL DIVERTICULUM REPAIR     uretrral diverticultis    There were no vitals filed for this visit.   Subjective Assessment - 09/16/20 1036     Subjective Pt reports overall min. improvement with her low back and hip pain and activity level. pt states she is using Arts development officer recommendations to minimize back strain.    Diagnostic tests Ap and lateral flexion and extension radiographs demonstrate degenerative   disc narrowing L1-2, L2-3, L3-4 and L5-S1 with vacuum sign at L5-S1 and   retrolisthesis of L2-3 and L5-S1 anterior disc osteophytes at L2-3 and   L5-S1 with end plate sclerosis and cystic changes of the endplates at both   X7-3 and L5-S1. The degenerative changes appear to have progressed since   the last  radiograph from 01/2019.    Patient Stated Goals To control my pain better    Currently in Pain? Yes    Pain Score 5     Pain Location Back    Pain Orientation Right;Left;Posterior    Pain Descriptors / Indicators Aching    Pain Type Chronic pain    Pain Onset More than a month ago    Pain Frequency Constant    Pain Relieving Factors TENs, use of body mechanics, exercises    Pain Score 5    Pain Location Hip    Pain Orientation Right    Pain Descriptors / Indicators Aching    Pain Type Chronic pain    Pain Onset More than a month ago    Pain Frequency Intermittent    Aggravating Factors  Sleeping                [facility] PT Assessment - 09/16/20 0001       AROM   AROM Assessment Site Lumbar    Lumbar Flexion 75% limitatio    Lumbar Extension 50% limitation    Lumbar - Right Side Bend 25% limitation    Lumbar - Left Side Bend 25% limitation    Lumbar - Right Rotation  25% limitation    Lumbar - Left Rotation 25% limitation                           [facility] Adult PT Treatment/Exercise - 09/16/20 0001       Ambulation/Gait   Gait Pattern Step-through pattern    Gait velocity appropriate pace      Exercises   Exercises Lumbar;Knee/Hip      Lumbar Exercises: Stretches   Lower Trunk Rotation 5 reps   5 sec   Hip Flexor Stretch Right;Left;2 reps;20 seconds    ITB Stretch Right;Left;2 reps;20 seconds    ITB Stretch Limitations supine, c strap    Piriformis Stretch Right;Left;2 reps;20 seconds      Lumbar Exercises: Aerobic   Nustep 9 min, L5; UEs/LEs      Lumbar Exercises: Supine   Pelvic Tilt 10 reps    Clam 10 reps   2 sets; single leg   Clam Limitations green Tband    Bent Knee Raise 10 reps   2 sets   Bent Knee Raise Limitations 3 sets of 10 sce holds for abdomial bracing    Bridge 10 reps;2 seconds   2 sets   Bridge Limitations c PPT                    PT Education - 09/16/20 1041     Education Details Pt was provided a memory  foam cushion to use under her hips when sleeping in side lying  to decrease pressure on her hips.    Person(s) Educated Patient    Methods Explanation    Comprehension Verbalized understanding;Returned demonstration              PT Short Term Goals - 08/26/20 0823       PT SHORT TERM GOAL #1   Title Pt will be Ind in an initial HEP    Status Achieved    Target Date 08/26/20      PT SHORT TERM GOAL #2   Title Pt will voice understanding of measures to assist pain reduction and management. Pt [place] using a TENS unit and completing a HEP which the pt reports are helpful with reducing her pain.    Status Achieved               PT Long Term Goals - 09/16/20 1121       PT LONG TERM GOAL #1   Title Pt will report improvement with chronic low back and L LE pain to 4/10 or less with daily activities. 09/16/20- 5/10    Status On-going    Target Date 10/25/20      PT LONG TERM GOAL #2   Title Pt's lumbar movement will all improve 25% greater than her baseline on eval. 09/16/20: 25% improvement for extension and l and R side bending    Status On-going    Target Date 10/25/20      PT LONG TERM GOAL #3   Title Pt will demonstrate proper body mechanics for household activities to minimize low back strain.    Status On-going    Target Date 10/25/20                   Plan - 09/16/20 1046     Clinical Impression Statement PT was continued for flexibility and strengthening of the lumbopelvic/hip region. PT received an additional referral to PT for hip bursitis from Dr. Louanne Skye on  08/20/20. Re-assessment of trunk ROM found trunk ext and side bending ROMs to be minimally improve, while trunk flexion remained decreased at 75%. Pt's gait pattern is improved from initially presenting to PT using a SPC, but has not done so for several appts. Pt walks with an appropriate pace and equal step length bilat. Pt is using a TENs unit, body mechanics and her HEP to help manage her pain. Pt was  provided a WC memory foam cushion to use under her hips when sleeping in side lying to decrease pressure. Pt will continue to benefit from PT 2w4 to address strength and flexibility to optimize function.    Personal Factors and Comorbidities Time since onset of injury/illness/exacerbation    Examination-Activity Limitations Locomotion Level;Stand;Lift;Bend;Transfers    Examination-Participation Restrictions --   ADLs   Stability/Clinical Decision Making Evolving/Moderate complexity    Clinical Decision Making Moderate    Rehab Potential Good    PT Frequency 2x / week    PT Duration 4 weeks    PT Treatment/Interventions ADLs/Self Care Home Management;Aquatic Therapy;Cryotherapy;Electrical Stimulation;Moist Heat;Traction;Therapeutic activities;Therapeutic exercise;Manual techniques;Patient/family education;Passive range of motion;Dry needling;Taping;Spinal Manipulations    PT Next Visit Plan Progress strength for the low back and hips. Continue education re: proper body mechanics for for household activities. Assess response of hip pain with use WC memory foam pad.    PT Home Exercise Plan [id number]    Consulted and Agree with Plan of Care Patient             Patient will benefit from skilled therapeutic intervention in order to improve the following deficits and impairments:  Abnormal gait, Decreased range of motion, Difficulty walking, Increased muscle spasms, Decreased activity tolerance, Pain, Decreased strength  Visit Diagnosis: Chronic low back pain with left-sided sciatica, unspecified back pain laterality  Difficulty in walking, not elsewhere classified  Muscle spasm of back  Other abnormalities of gait and mobility  Pain in left hip  Pain in right hip     Problem List Patient Active Problem List   Diagnosis Date Noted   Centrilobular emphysema ([place]) 07/29/2020   Multiple nodules of lung 07/29/2020   Hyperlipidemia 07/18/2017   Ulnar neuropathy 04/18/2017   GERD  (gastroesophageal reflux disease) 10/19/2016   Prediabetes 05/03/2016   Cubital tunnel syndrome on right 03/01/2016   Numbness and tingling of right arm 01/01/2016   Chlamydia infection 01/27/2015   Tobacco abuse 12/26/2013   Chronic lower back pain 04/13/2013   Sciatica 04/13/2013   Chronic neck pain 04/13/2013   Spondylosis of cervical spine 04/13/2013    Gar Ponto MS, PT 09/16/20 11:29 AM   [facility] [address] [place], [place], [address] Phone: [phone]   Fax:  [phone]  Name: Cheryl King MRN: [id number] Date of Birth: Jul 15, 1965  Check all possible CPT codes: 38101- Therapeutic Exercise, [phone] - Gait Training, [phone] - Manual Therapy, [phone] - Therapeutic Activities, [phone] - Self Care, [phone] - Electrical stimulation (Manual), and [phone] - Ultrasound  PHYSICAL THERAPY DISCHARGE SUMMARY  Visits from Start of Care: 8  Current functional level related to goals / functional outcomes: See above   Remaining deficits: See above   Education / Equipment: HEP   Patient agrees to discharge. Patient goals were partially met. Patient is being discharged due to not returning since the last visit.  Reighn Kaplan MS, PT 01/09/21 7:46 AM

## 2020-09-17 DIAGNOSIS — F4310 Post-traumatic stress disorder, unspecified: Secondary | ICD-10-CM | POA: Diagnosis not present

## 2020-09-17 DIAGNOSIS — F331 Major depressive disorder, recurrent, moderate: Secondary | ICD-10-CM | POA: Diagnosis not present

## 2020-09-18 ENCOUNTER — Ambulatory Visit: Payer: Medicaid Other

## 2020-09-23 DIAGNOSIS — Z79899 Other long term (current) drug therapy: Secondary | ICD-10-CM | POA: Diagnosis not present

## 2020-09-26 ENCOUNTER — Ambulatory Visit: Payer: Medicaid Other | Admitting: Specialist

## 2020-09-26 DIAGNOSIS — Z20822 Contact with and (suspected) exposure to covid-19: Secondary | ICD-10-CM | POA: Diagnosis not present

## 2020-09-27 ENCOUNTER — Ambulatory Visit
Admission: RE | Admit: 2020-09-27 | Discharge: 2020-09-27 | Disposition: A | Discharge status: Home / Self Care | Payer: Medicaid Other | Source: Ambulatory Visit | Attending: Specialist | Admitting: Specialist

## 2020-09-27 DIAGNOSIS — M5416 Radiculopathy, lumbar region: Secondary | ICD-10-CM

## 2020-09-27 DIAGNOSIS — M5127 Other intervertebral disc displacement, lumbosacral region: Secondary | ICD-10-CM | POA: Diagnosis not present

## 2020-09-27 DIAGNOSIS — M5126 Other intervertebral disc displacement, lumbar region: Secondary | ICD-10-CM | POA: Diagnosis not present

## 2020-09-27 DIAGNOSIS — M419 Scoliosis, unspecified: Secondary | ICD-10-CM | POA: Diagnosis not present

## 2020-09-27 DIAGNOSIS — M48062 Spinal stenosis, lumbar region with neurogenic claudication: Secondary | ICD-10-CM

## 2020-09-27 DIAGNOSIS — M7061 Trochanteric bursitis, right hip: Secondary | ICD-10-CM

## 2020-09-27 DIAGNOSIS — M48061 Spinal stenosis, lumbar region without neurogenic claudication: Secondary | ICD-10-CM | POA: Diagnosis not present

## 2020-09-27 MED ORDER — GADOBENATE DIMEGLUMINE 529 MG/ML IV SOLN
15.0000 mL | Freq: Once | INTRAVENOUS | Status: AC | PRN
  Administered 2020-09-27: 15 mL via INTRAVENOUS

## 2020-09-29 ENCOUNTER — Encounter: Payer: Self-pay | Admitting: Cardiology

## 2020-09-29 ENCOUNTER — Ambulatory Visit: Payer: Medicaid Other | Admitting: Cardiology

## 2020-09-29 ENCOUNTER — Encounter: Payer: Self-pay | Admitting: Acute Care

## 2020-09-29 ENCOUNTER — Other Ambulatory Visit: Payer: Self-pay

## 2020-09-29 ENCOUNTER — Ambulatory Visit (INDEPENDENT_AMBULATORY_CARE_PROVIDER_SITE_OTHER): Payer: Medicaid Other | Admitting: Pulmonary Disease

## 2020-09-29 ENCOUNTER — Ambulatory Visit: Payer: Medicaid Other | Admitting: Acute Care

## 2020-09-29 VITALS — BP 108/73 | HR 71 | Temp 97.7°F | Resp 17 | Ht 65.0 in | Wt 155.8 lb

## 2020-09-29 VITALS — BP 110/62 | HR 80 | Temp 97.3°F | Ht 65.0 in | Wt 155.4 lb

## 2020-09-29 DIAGNOSIS — I2721 Secondary pulmonary arterial hypertension: Secondary | ICD-10-CM | POA: Diagnosis not present

## 2020-09-29 DIAGNOSIS — R0609 Other forms of dyspnea: Secondary | ICD-10-CM

## 2020-09-29 DIAGNOSIS — J449 Chronic obstructive pulmonary disease, unspecified: Secondary | ICD-10-CM

## 2020-09-29 DIAGNOSIS — I700 Atherosclerosis of aorta: Secondary | ICD-10-CM

## 2020-09-29 DIAGNOSIS — F17210 Nicotine dependence, cigarettes, uncomplicated: Secondary | ICD-10-CM | POA: Diagnosis not present

## 2020-09-29 DIAGNOSIS — F17200 Nicotine dependence, unspecified, uncomplicated: Secondary | ICD-10-CM

## 2020-09-29 DIAGNOSIS — E7800 Pure hypercholesterolemia, unspecified: Secondary | ICD-10-CM

## 2020-09-29 DIAGNOSIS — R0600 Dyspnea, unspecified: Secondary | ICD-10-CM

## 2020-09-29 DIAGNOSIS — J432 Centrilobular emphysema: Secondary | ICD-10-CM

## 2020-09-29 DIAGNOSIS — Z0000 Encounter for general adult medical examination without abnormal findings: Secondary | ICD-10-CM

## 2020-09-29 DIAGNOSIS — I2510 Atherosclerotic heart disease of native coronary artery without angina pectoris: Secondary | ICD-10-CM

## 2020-09-29 LAB — PULMONARY FUNCTION TEST
DL/VA % pred: 40 %
DL/VA: 1.72 ml/min/mmHg/L
DLCO cor % pred: 33 %
DLCO cor: 7.02 ml/min/mmHg
DLCO unc % pred: 33 %
DLCO unc: 7.02 ml/min/mmHg
FEF 25-75 Post: 0.55 L/sec
FEF 25-75 Pre: 0.41 L/sec
FEF2575-%Change-Post: 36 %
FEF2575-%Pred-Post: 24 %
FEF2575-%Pred-Pre: 18 %
FEV1-%Change-Post: 13 %
FEV1-%Pred-Post: 50 %
FEV1-%Pred-Pre: 44 %
FEV1-Post: 1.14 L
FEV1-Pre: 1.01 L
FEV1FVC-%Change-Post: 4 %
FEV1FVC-%Pred-Pre: 65 %
FEV6-%Change-Post: 10 %
FEV6-%Pred-Post: 72 %
FEV6-%Pred-Pre: 65 %
FEV6-Post: 2 L
FEV6-Pre: 1.81 L
FEV6FVC-%Change-Post: 1 %
FEV6FVC-%Pred-Post: 98 %
FEV6FVC-%Pred-Pre: 97 %
FVC-%Change-Post: 8 %
FVC-%Pred-Post: 73 %
FVC-%Pred-Pre: 67 %
FVC-Post: 2.1 L
FVC-Pre: 1.93 L
Post FEV1/FVC ratio: 54 %
Post FEV6/FVC ratio: 95 %
Pre FEV1/FVC ratio: 52 %
Pre FEV6/FVC Ratio: 94 %
RV % pred: 155 %
RV: 3.08 L
TLC % pred: 102 %
TLC: 5.32 L

## 2020-09-29 NOTE — Progress Notes (Signed)
Primary Physician/Referring:  Charlott Rakes, MD  Patient ID: Cheryl King, female    DOB: 1965-11-19, 55 y.o.   MRN: [id number]  Chief Complaint  Patient presents with   New Patient (Initial Visit)   coronary artery calcif   aortic atherosclerosis   HPI:    Cheryl King  is a 55 y.o. African-American female patient with long history of tobacco use disorder, hyperlipidemia, shortness of breath secondary to COPD presents to self establish care due to shortness of breath and coronary calcification and aortic atherosclerosis noted on the recent CT scan of the chest.  Except for shortness of breath on exertion she remains asymptomatic.    Past Medical History:  Diagnosis Date   Acid reflux    Allergy    Carpal tunnel syndrome    Depression    Diverticulitis    Hyperlipidemia    Low back pain    Neck pain    Sacroiliac inflammation (HCC)    Sciatica    Seasonal allergies    Past Surgical History:  Procedure Laterality Date   ABDOMINAL SURGERY     ANKLE SURGERY     lt.   PARTIAL HYSTERECTOMY     right breast cyst removed      URETHRAL DIVERTICULUM REPAIR     uretrral diverticultis   Family History  Problem Relation Age of Onset   Healthy Mother    Other Father        Unsure of medical history   Asthma Maternal Aunt    Diabetes Maternal Aunt    Cancer Maternal Aunt    Breast cancer Maternal Aunt    Hypertension Maternal Grandmother    Heart Problems Maternal Grandmother    Colon cancer Neg Hx    Pancreatic cancer Neg Hx    Rectal cancer Neg Hx    Stomach cancer Neg Hx    Colon polyps Neg Hx    Esophageal cancer Neg Hx     Social History   Tobacco Use   Smoking status: Some Days    Packs/day: 0.50    Years: 35.00    Pack years: 17.50    Types: Cigarettes    Start date: 41   Smokeless tobacco: Never   Tobacco comments:    Wears patcches, 1 or 2 ciggs daily  Substance Use Topics   Alcohol use: Yes    Comment: Social only - seldom    Marital Status: Married  ROS  Review of Systems  Cardiovascular:  Positive for dyspnea on exertion. Negative for chest pain and leg swelling.  Musculoskeletal:  Positive for back pain (chronic and sciatica).  Gastrointestinal:  Negative for melena.  Objective  Blood pressure 108/73, pulse 71, temperature 97.7 F (36.5 C), temperature source Temporal, resp. rate 17, height '5\' 5"'$  (1.651 m), weight 155 lb 12.8 oz (70.7 kg), SpO2 93 %. Body mass index is 25.93 kg/m.  Vitals with BMI 09/29/2020 09/29/2020 09/29/2020  Height '5\' 5"'$  '5\' 5"'$  -  Weight 155 lbs 13 oz 155 lbs 6 oz -  BMI [id number] -  Systolic [id number] -  Diastolic 73 62 -  Pulse 71 80 80     Physical Exam Neck:     Vascular: No carotid bruit or JVD.  Cardiovascular:     Rate and Rhythm: Normal rate and regular rhythm.     Pulses: Intact distal pulses.     Heart sounds: Normal heart sounds. No murmur heard.   No gallop.  Pulmonary:  Effort: Pulmonary effort is normal.     Breath sounds: Normal breath sounds.  Abdominal:     General: Bowel sounds are normal.     Palpations: Abdomen is soft.  Musculoskeletal:        General: No swelling.     Laboratory examination:   Recent Labs    01/28/20 1012  NA 138  K 4.4  CL 102  CO2 23  GLUCOSE 84  BUN 22  CREATININE 0.79  CALCIUM 9.5  GFRNONAA 84  GFRAA 97   CrCl cannot be calculated (Patient's most recent lab result is older than the maximum 21 days allowed.).  CMP Latest Ref Rng & Units 01/28/2020 07/04/2019 09/20/2018  Glucose 65 - 99 mg/dL 84 86 92  BUN 6 - 24 mg/dL '22 23 17  '$ Creatinine 0.57 - 1.00 mg/dL 0.79 0.82 0.72  Sodium 134 - 144 mmol/L 138 139 141  Potassium 3.5 - 5.2 mmol/L 4.4 4.9 4.9  Chloride 96 - 106 mmol/L 102 101 102  CO2 20 - 29 mmol/L '23 26 27  '$ Calcium 8.7 - 10.2 mg/dL 9.5 9.8 9.7  Total Protein 6.0 - 8.5 g/dL 6.9 7.3 6.7  Total Bilirubin 0.0 - 1.2 mg/dL 0.4 0.3 0.3  Alkaline Phos 44 - 121 IU/L 106 108 113  AST 0 - 40 IU/L '22 22 28  '$ ALT 0  - 32 IU/L '16 12 27   '$ CBC Latest Ref Rng & Units 04/18/2017 05/03/2016 04/13/2013  WBC 3.4 - 10.8 x10E3/uL 8.6 7.6 9.3  Hemoglobin 11.1 - 15.9 g/dL 14.9 14.9 15.9(H)  Hematocrit 34.0 - 46.6 % 44.7 43.8 46.0  Platelets 150 - 379 x10E3/uL 236 246 226    Lipid Panel Recent Labs    01/28/20 1012  CHOL 191  TRIG 74  LDLCALC 117*  HDL 60  CHOLHDL 3.2   Lipid Panel     Component Value Date/Time   CHOL 191 01/28/2020 1012   TRIG 74 01/28/2020 1012   HDL 60 01/28/2020 1012   CHOLHDL 3.2 01/28/2020 1012   CHOLHDL 3.2 12/26/2013 1319   VLDL 18 12/26/2013 1319   LDLCALC 117 (H) 01/28/2020 1012   LABVLDL 14 01/28/2020 1012     HEMOGLOBIN A1C Lab Results  Component Value Date   HGBA1C 5.6 (A) 07/04/2019   TSH No results for input(s): TSH in the last 8760 hours.  Medications and allergies   Allergies  Allergen Reactions   Latex Other (See Comments)    Pt unsure of reaction   Percocet [Oxycodone-Acetaminophen] Nausea Only   Vicodin [Hydrocodone-Acetaminophen] Nausea Only   Penicillins Rash     Medication prior to this encounter:   Outpatient Medications Prior to Visit  Medication Sig Dispense Refill   Acetaminophen-Codeine 300-30 MG tablet Take 1 tablet by mouth 2 (two) times daily as needed. for pain 30 tablet 0   amitriptyline (ELAVIL) 50 MG tablet TAKE 1 TABLET (50 MG TOTAL) BY MOUTH AT BEDTIME. 90 tablet 1   atorvastatin (LIPITOR) 20 MG tablet TAKE 1 TABLET (20 MG TOTAL) BY MOUTH DAILY. 90 tablet 1   cetirizine (ZYRTEC) 10 MG tablet Take 1 tablet (10 mg total) by mouth daily. 90 tablet 1   cyclobenzaprine (FLEXERIL) 10 MG tablet TAKE 1 TABLET (10 MG TOTAL) BY MOUTH 2 (TWO) TIMES DAILY AS NEEDED FOR MUSCLE SPASMS. 180 tablet 1   diclofenac sodium (VOLTAREN) 1 % GEL Apply 2 g topically 4 (four) times daily. 5 Tube 2   DULoxetine (CYMBALTA) 60 MG capsule TAKE 1  CAPSULE (60 MG TOTAL) BY MOUTH DAILY. 90 capsule 1   fluticasone (FLONASE) 50 MCG/ACT nasal spray PLACE 2 SPRAYS  INTO BOTH NOSTRILS DAILY. 16 g 2   Lactobacillus CAPS Take 1 capsule by mouth daily.     lidocaine (LIDODERM) 5 % PLACE 1 PATCH ONTO THE SKIN DAILY. REMOVE & DISCARD PATCH WITHIN 12 HOURS OR AS DIRECTED BY MD 30 patch 6   Multiple Vitamin (MULTIVITAMIN WITH MINERALS) TABS tablet Take 1 tablet by mouth daily.     naproxen (NAPROSYN) 500 MG tablet TAKE 1 TABLET BY MOUTH TWO TIMES DAILY WITH MEALS 60 tablet 3   nicotine (NICODERM CQ - DOSED IN MG/24 HOURS) 21 mg/24hr patch PLACE 1 PATCH (21 MG TOTAL) ONTO THE SKIN DAILY. FOR 4 WEEKS THEN '7MG'$ /24HR DAILY FOR 4 WEEKS 28 patch 2   oxybutynin (DITROPAN) 5 MG tablet TAKE 1 TABLET (5 MG TOTAL) BY MOUTH 2 (TWO) TIMES DAILY. 180 tablet 1   phenazopyridine (PYRIDIUM) 200 MG tablet Take 1 tablet (200 mg total) by mouth 3 (three) times daily as needed for pain. 6 tablet 0   pregabalin (LYRICA) 75 MG capsule Take 1 capsule (75 mg total) by mouth 2 (two) times daily. 60 capsule 5   RESTASIS 0.05 % ophthalmic emulsion   3   triamcinolone cream (KENALOG) 0.1 % Apply 1 application topically 2 (two) times daily. 45 g 1   umeclidinium-vilanterol (ANORO ELLIPTA) 62.5-25 MCG/INH AEPB Inhale 1 puff into the lungs daily. 60 each 5   Facility-Administered Medications Prior to Visit  Medication Dose Route Frequency Provider Last Rate Last Admin   0.9 %  sodium chloride infusion  500 mL Intravenous Once Doran Stabler, MD         Medication list after today's encounter   Current Outpatient Medications  Medication Instructions   Acetaminophen-Codeine 300-30 MG tablet 1 tablet, Oral, 2 times daily PRN, for pain   amitriptyline (ELAVIL) 50 MG tablet TAKE 1 TABLET (50 MG TOTAL) BY MOUTH AT BEDTIME.   atorvastatin (LIPITOR) 20 MG tablet TAKE 1 TABLET (20 MG TOTAL) BY MOUTH DAILY.   cetirizine (ZYRTEC) 10 mg, Oral, Daily   cyclobenzaprine (FLEXERIL) 10 MG tablet TAKE 1 TABLET (10 MG TOTAL) BY MOUTH 2 (TWO) TIMES DAILY AS NEEDED FOR MUSCLE SPASMS.   diclofenac sodium  (VOLTAREN) 2 g, Topical, 4 times daily   DULoxetine (CYMBALTA) 60 MG capsule TAKE 1 CAPSULE (60 MG TOTAL) BY MOUTH DAILY.   fluticasone (FLONASE) 50 MCG/ACT nasal spray PLACE 2 SPRAYS INTO BOTH NOSTRILS DAILY.   Lactobacillus CAPS 1 capsule, Daily   lidocaine (LIDODERM) 5 % PLACE 1 PATCH ONTO THE SKIN DAILY. REMOVE & DISCARD PATCH WITHIN 12 HOURS OR AS DIRECTED BY MD   Multiple Vitamin (MULTIVITAMIN WITH MINERALS) TABS tablet 1 tablet, Daily   naproxen (NAPROSYN) 500 MG tablet TAKE 1 TABLET BY MOUTH TWO TIMES DAILY WITH MEALS   nicotine (NICODERM CQ - DOSED IN MG/24 HOURS) 21 mg/24hr patch PLACE 1 PATCH (21 MG TOTAL) ONTO THE SKIN DAILY. FOR 4 WEEKS THEN '7MG'$ /24HR DAILY FOR 4 WEEKS   oxybutynin (DITROPAN) 5 MG tablet TAKE 1 TABLET (5 MG TOTAL) BY MOUTH 2 (TWO) TIMES DAILY.   phenazopyridine (PYRIDIUM) 200 mg, Oral, 3 times daily PRN   pregabalin (LYRICA) 75 mg, Oral, 2 times daily   RESTASIS 0.05 % ophthalmic emulsion No dose, route, or frequency recorded.   triamcinolone cream (KENALOG) 0.1 % 1 application, Topical, 2 times daily   umeclidinium-vilanterol (ANORO ELLIPTA)  62.5-25 MCG/INH AEPB 1 puff, Inhalation, Daily    Radiology:   CT chest without contrast 09/03/2020: 1. Signs of pulmonary emphysema as before. 2. 5 mm nodule along the posterior RIGHT lower lobe, unchanged since June of 2021. Considered benign. 3. Central pulmonary vasculature is engorged at 3.6 cm, increased compared to previous imaging, can be seen with pulmonary arterial hypertension. 4. Coronary artery calcifications. 5. Aortic atherosclerosis.  Cardiac Studies:   NA  EKG:   Sinus rhythm with short PR interval at a rate of 64 bpm, normal axis, no evidence of ischemia. No significant change from 12/26/2013.  Assessment     ICD-10-CM   1. Coronary artery calcification  I25.10 EKG 12-Lead   I25.84 PCV CARDIAC STRESS TEST    TSH    2. Aortic atherosclerosis (HCC)  I70.0     3. Dyspnea on exertion  R06.00 PCV  ECHOCARDIOGRAM COMPLETE    4. Tobacco use disorder  F17.200     5. Hypercholesteremia  E78.00 Lipid Panel With LDL/HDL Ratio      There are no discontinued medications.  No orders of the defined types were placed in this encounter.  Orders Placed This Encounter  Procedures   Lipid Panel With LDL/HDL Ratio   TSH   PCV CARDIAC STRESS TEST    Standing Status:   Future    Standing Expiration Date:   11/29/2020   EKG 12-Lead   PCV ECHOCARDIOGRAM COMPLETE    Standing Status:   Future    Standing Expiration Date:   09/29/2021   Recommendations:   Tondalaya Belew is a 55 y.o. African-American female patient with long history of tobacco use disorder, hyperlipidemia, shortness of breath secondary to COPD presents to self establish care due to shortness of breath and coronary calcification and aortic atherosclerosis noted on the recent CT scan of the chest.  Except for shortness of breath on exertion she remains asymptomatic.  Physical examination is unremarkable and EKG is normal.  I reviewed with her regarding the CT scan and the concern for tobacco use disorder with underlying emphysema and also pulmonary hypertension with pulmonary artery dilatation.  Stressed to her that the importance of smoking cessation could potentially prevent further deterioration in her lung volumes and also decrease the risk of progression of pulmonary hypertension and right-sided heart failure.  She appears motivated, she has recently quit smoking.  I also discussed with her regarding potential for weight gain as she has family history of morbid obesity.  I will schedule her for an echocardiogram and a routine treadmill exercise stress test.  Check lipids and TSH, in view of significant atherosclerotic burden noted on the CT scan, goal LDL will be closer to 70.  No changes in the medications were done today.    Adrian Prows, MD, [facility] 09/29/2020, 12:55 PM Office: [phone]

## 2020-09-29 NOTE — Patient Instructions (Addendum)
It is good to see you today. Please work on using your Anoro every day regardless of how you feel. This is your maintenance medication and is something you should take every day.  Use your albuterol as needed for breakthrough shortness of breath, or before exertional activities. Albuterol is your rescue medicine. Carry this with you.  Wear mask when mowing your lawn. Work on quitting smoking. Continue using the 21 mg patches.  Please talk with your PCP about the order in which she wants you to get your vaccines ( Needed Covid, Flu and pneumococcal) Follow up with Judson Roch NP or Dr. [name] in 8 weeks to see how you are doing using your Anoro every day. We can consider starting Trelegy is you are still having shortness of breath. Please contact office for sooner follow up if symptoms do not improve or worsen or seek emergency care    .

## 2020-09-29 NOTE — Progress Notes (Signed)
History of Present Illness Cheryl King is a 55 y.o. female current every day smoker  seen 07/29/2020 by Dr. [name] for dyspnea on exertion.   Synopsis: Smokes 1/2 PPD for 40 years. States she has had worsening dyspnea with exertion recently. She does have cats and a dog. PFT's 09/2020 show severe COPD.    09/29/2020 Pt. Presents for follow up. She states her breathing is a bit better on the Anoro, but she has not been using it every day. She says she forgets to use it. We discussed placing it on the counter in the bathroom to use before she brushes her teeth each day. Also putting a post it note on her mirror in the morning to remind her. She said she will try this to help her remember.  She started using nicotine patch 21 mg  09/10/2020. She says some days she does not smoke , and other days she smokes based on her anxiety. She states she has had about 3 bad days where she smoked since 8/24. She does want to quit smoking. We reviewed her PFT's which show severe COPD. F/F Ratio is 52%, FEV1 is 44%, + BD response, DLCO very diminished at 33%. We discussed how the scarring of emphysema has changed her lungs ability to transport oxygen and exchange CO2 due to this scarring. We discussed that this is making her short of breath, and that her inhalers will help optimize her pulmonary function. She verbalized understanding of this.   She has not been using her rescue inhaler. She has not been wheezing that she is aware of. She has minimal secretions that are clear. She does not awaken at night gasping for air.   She understands that if she does not quit smoking, she may eventually end up on oxygen, with even worse emphysema. . Sats today were 95% after ambulating to the exam room. CT Chest showed engorged pulmonary vasculature, increased since last CT, concerning for possible pulmonary HTN, which may also be contributing to her dyspnea. I will order an echo at 8 week follow up to better evaluate heart  function , and consider referral to cards vs Dr. Silas Flood.   Test Results: PFT's 09/29/2020             CBC Latest Ref Rng & Units 04/18/2017 05/03/2016 04/13/2013  WBC 3.4 - 10.8 x10E3/uL 8.6 7.6 9.3  Hemoglobin 11.1 - 15.9 g/dL 14.9 14.9 15.9(H)  Hematocrit 34.0 - 46.6 % 44.7 43.8 46.0  Platelets 150 - 379 x10E3/uL 236 246 226    BMP Latest Ref Rng & Units 01/28/2020 07/04/2019 09/20/2018  Glucose 65 - 99 mg/dL 84 86 92  BUN 6 - 24 mg/dL '22 23 17  '$ Creatinine 0.57 - 1.00 mg/dL 0.79 0.82 0.72  BUN/Creat Ratio 9 - 23 28(H) 28(H) 24(H)  Sodium 134 - 144 mmol/L 138 139 141  Potassium 3.5 - 5.2 mmol/L 4.4 4.9 4.9  Chloride 96 - 106 mmol/L 102 101 102  CO2 20 - 29 mmol/L '23 26 27  '$ Calcium 8.7 - 10.2 mg/dL 9.5 9.8 9.7    BNP No results found for: BNP  ProBNP No results found for: PROBNP  PFT    Component Value Date/Time   FEV1PRE 1.01 09/29/2020 0953   FEV1POST 1.14 09/29/2020 0953   FVCPRE 1.93 09/29/2020 0953   FVCPOST 2.10 09/29/2020 0953   TLC 5.32 09/29/2020 0953   DLCOUNC 7.02 09/29/2020 0953   PREFEV1FVCRT 52 09/29/2020 0953   PSTFEV1FVCRT 54 09/29/2020 0953  CT Chest Wo Contrast  Result Date: 09/04/2020 CLINICAL DATA:  Lung nodule follow-up. A 55 year old female with nodules discovered on a prior chest CT for lung cancer screening. EXAM: CT CHEST WITHOUT CONTRAST TECHNIQUE: Multidetector CT imaging of the chest was performed following the standard protocol without IV contrast. COMPARISON:  Comparison made with July 12, 2019. FINDINGS: Cardiovascular: Calcified atheromatous plaque in the nonaneurysmal thoracic aorta. Heart size normal without pericardial effusion. Central pulmonary vasculature is engorged at 3.6 cm, increased compared to previous imaging. Heart size normal without pericardial effusion. Scattered LEFT coronary artery calcifications. Mediastinum/Nodes: Esophagus mildly patulous. No thoracic inlet lymphadenopathy. No axillary adenopathy. No mediastinal  or gross hilar adenopathy. No internal mammary adenopathy. Lungs/Pleura: Signs of pulmonary emphysema as before. Nodule along the posterior RIGHT lower lobe (image 67/8) 5 mm, unchanged since June of 2021. Considered benign. Basilar atelectasis. No effusion or consolidative changes. Airways are patent. Upper Abdomen: Incidental imaging of upper abdominal contents is unremarkable with respect imaged portions of liver, adrenal glands, kidneys, spleen, pancreas and limited assessment of the gastrointestinal tract. No upper abdominal lymphadenopathy. Musculoskeletal: No acute musculoskeletal process. Spinal degenerative changes. No destructive bone finding. IMPRESSION: 1. Signs of pulmonary emphysema as before. 2. 5 mm nodule along the posterior RIGHT lower lobe, unchanged since June of 2021. Considered benign. 3. Central pulmonary vasculature is engorged at 3.6 cm, increased compared to previous imaging, can be seen with pulmonary arterial hypertension. 4. Coronary artery calcifications. 5. Aortic atherosclerosis. Aortic Atherosclerosis (ICD10-I70.0) and Emphysema (ICD10-J43.9). Electronically Signed   By: Zetta Bills M.D.   On: 09/04/2020 09:12     Past medical hx Past Medical History:  Diagnosis Date   Acid reflux    Allergy    Carpal tunnel syndrome    Depression    Diverticulitis    Hyperlipidemia    Low back pain    Neck pain    Sacroiliac inflammation (HCC)    Sciatica    Seasonal allergies      Social History   Tobacco Use   Smoking status: Some Days    Packs/day: 0.50    Years: 35.00    Pack years: 17.50    Types: Cigarettes    Start date: 50   Smokeless tobacco: Never   Tobacco comments:    Wears patcches, 1 or 2 ciggs daily  Vaping Use   Vaping Use: Never used  Substance Use Topics   Alcohol use: Yes    Comment: Social only - seldom   Drug use: No    Ms.Bingley reports that she has been smoking cigarettes. She started smoking about 41 years ago. She has a 17.50  pack-year smoking history. She has never used smokeless tobacco. She reports current alcohol use. She reports that she does not use drugs.  Tobacco Cessation: Ready to quit: No Counseling given: Yes Tobacco comments: Wears patcches, 1 or 2 ciggs daily Wearing 21 mg patches  Past surgical hx, Family hx, Social hx all reviewed.  Current Outpatient Medications on File Prior to Visit  Medication Sig   Acetaminophen-Codeine 300-30 MG tablet Take 1 tablet by mouth 2 (two) times daily as needed. for pain   amitriptyline (ELAVIL) 50 MG tablet TAKE 1 TABLET (50 MG TOTAL) BY MOUTH AT BEDTIME.   atorvastatin (LIPITOR) 20 MG tablet TAKE 1 TABLET (20 MG TOTAL) BY MOUTH DAILY.   cetirizine (ZYRTEC) 10 MG tablet Take 1 tablet (10 mg total) by mouth daily.   cyclobenzaprine (FLEXERIL) 10 MG tablet TAKE 1 TABLET (10  MG TOTAL) BY MOUTH 2 (TWO) TIMES DAILY AS NEEDED FOR MUSCLE SPASMS.   diclofenac sodium (VOLTAREN) 1 % GEL Apply 2 g topically 4 (four) times daily.   DULoxetine (CYMBALTA) 60 MG capsule TAKE 1 CAPSULE (60 MG TOTAL) BY MOUTH DAILY.   fluticasone (FLONASE) 50 MCG/ACT nasal spray PLACE 2 SPRAYS INTO BOTH NOSTRILS DAILY.   Lactobacillus CAPS Take 1 capsule by mouth daily.   lidocaine (LIDODERM) 5 % PLACE 1 PATCH ONTO THE SKIN DAILY. REMOVE & DISCARD PATCH WITHIN 12 HOURS OR AS DIRECTED BY MD   Multiple Vitamin (MULTIVITAMIN WITH MINERALS) TABS tablet Take 1 tablet by mouth daily.   naproxen (NAPROSYN) 500 MG tablet TAKE 1 TABLET BY MOUTH TWO TIMES DAILY WITH MEALS   nicotine (NICODERM CQ - DOSED IN MG/24 HOURS) 21 mg/24hr patch PLACE 1 PATCH (21 MG TOTAL) ONTO THE SKIN DAILY. FOR 4 WEEKS THEN '7MG'$ /24HR DAILY FOR 4 WEEKS   oxybutynin (DITROPAN) 5 MG tablet TAKE 1 TABLET (5 MG TOTAL) BY MOUTH 2 (TWO) TIMES DAILY.   phenazopyridine (PYRIDIUM) 200 MG tablet Take 1 tablet (200 mg total) by mouth 3 (three) times daily as needed for pain.   pregabalin (LYRICA) 75 MG capsule Take 1 capsule (75 mg total) by  mouth 2 (two) times daily.   RESTASIS 0.05 % ophthalmic emulsion    triamcinolone cream (KENALOG) 0.1 % Apply 1 application topically 2 (two) times daily.   umeclidinium-vilanterol (ANORO ELLIPTA) 62.5-25 MCG/INH AEPB Inhale 1 puff into the lungs daily.   Current Facility-Administered Medications on File Prior to Visit  Medication   0.9 %  sodium chloride infusion     Allergies  Allergen Reactions   Latex Other (See Comments)    Pt unsure of reaction   Percocet [Oxycodone-Acetaminophen] Nausea Only   Vicodin [Hydrocodone-Acetaminophen] Nausea Only   Penicillins Rash    Review Of Systems:  Constitutional:   No  weight loss, night sweats,  Fevers, chills, fatigue, or  lassitude.  HEENT:   No headaches,  Difficulty swallowing,  Tooth/dental problems, or  Sore throat,                No sneezing, itching, ear ache, nasal congestion, post nasal drip,   CV:  No chest pain,  Orthopnea, PND, swelling in lower extremities, anasarca, dizziness, palpitations, syncope.   GI  No heartburn, indigestion, abdominal pain, nausea, vomiting, diarrhea, change in bowel habits, loss of appetite, bloody stools.   Resp: + shortness of breath with exertion less at rest.  No excess mucus, no productive cough,  No non-productive cough,  No coughing up of blood.  No change in color of mucus.  No wheezing.  No chest wall deformity  Skin: no rash or lesions.  GU: no dysuria, change in color of urine, no urgency or frequency.  No flank pain, no hematuria   MS:  No joint pain or swelling.  No decreased range of motion.  No back pain.  Psych:  No change in mood or affect. No depression or anxiety.  No memory loss.   Vital Signs BP 110/62 (BP Location: Left Arm, Cuff Size: Normal)   Pulse 80   Temp (!) 97.3 F (36.3 C) (Oral)   Ht '5\' 5"'$  (1.651 m)   Wt 155 lb 6.4 oz (70.5 kg)   SpO2 95%   BMI 25.86 kg/m    Physical Exam:  General- No distress,  A&Ox3, very pleasant ENT: No sinus tenderness, TM  clear, pale nasal mucosa, no oral  exudate,no post nasal drip, no LAN Cardiac: S1, S2, regular rate and rhythm, no murmur Chest: No wheeze/ rales/ dullness; no accessory muscle use, no nasal flaring, no sternal retractions, diminished per bases. Prolonged expiration Abd.: Soft Non-tender, ND, BS +, Body mass index is 25.86 kg/m. Ext: No clubbing cyanosis, edema Neuro:  normal strength, MAE x 4, A&O x 3 Skin: No rashes, No lesions, warm and dry, intact, no lesions  Psych: normal mood and behavior   Assessment/Plan Severe COPD/ Emphysema per PFT's Tobacco Abuse Preventative Health Maintenance>> Vaccines ? PAH per CT Chest   Plan Please work on using your Anoro every day regardless of how you feel. This is your maintenance medication and is something you should take every day.  Use your albuterol as needed for breakthrough shortness of breath, or before exertional activities. Albuterol is your rescue medicine. Carry this with you.  Wear mask when mowing your lawn. Work on quitting smoking. Continue using the 21 mg patches.  Please talk with your PCP about the order in which she wants you to get your vaccines ( Needed Covid, Flu and pneumococcal) I will order an echo at 8 week follow up to better evaluate heart function , and consider referral to cards vs Dr. Silas Flood to evaluate for [facility] Follow up with Judson Roch NP or Dr. [name] in 8 weeks to see how you are doing using your Anoro every day. We can consider starting Trelegy is you are still having shortness of breath. Please contact office for sooner follow up if symptoms do not improve or worsen or seek emergency care     I spent 40  minutes dedicated to the care of this patient on the date of this encounter to include pre-visit review of records, face-to-face time with the patient discussing conditions above, post visit ordering of testing, clinical documentation with the electronic health record, making appropriate referrals as documented,  and communicating necessary information to the patient's healthcare team.    Magdalen Spatz, NP 09/29/2020  11:02 AM

## 2020-09-29 NOTE — Progress Notes (Signed)
PFT done today. 

## 2020-10-01 LAB — LIPID PANEL WITH LDL/HDL RATIO
Cholesterol, Total: 197 mg/dL (ref 100–199)
HDL: 63 mg/dL (ref >39)
LDL Chol Calc (NIH): 120 mg/dL — ABNORMAL HIGH (ref 0–99)
LDL/HDL Ratio: 1.9 ratio (ref 0.0–3.2)
Triglycerides: 80 mg/dL (ref 0–149)
VLDL Cholesterol Cal: 14 mg/dL (ref 5–40)

## 2020-10-01 LAB — TSH: TSH: 0.893 u[IU]/mL (ref 0.450–4.500)

## 2020-10-02 DIAGNOSIS — F331 Major depressive disorder, recurrent, moderate: Secondary | ICD-10-CM | POA: Diagnosis not present

## 2020-10-02 DIAGNOSIS — F4310 Post-traumatic stress disorder, unspecified: Secondary | ICD-10-CM | POA: Diagnosis not present

## 2020-10-08 ENCOUNTER — Other Ambulatory Visit: Payer: Self-pay | Admitting: Specialist

## 2020-10-09 ENCOUNTER — Ambulatory Visit: Payer: Medicaid Other

## 2020-10-09 ENCOUNTER — Other Ambulatory Visit: Payer: Self-pay

## 2020-10-09 ENCOUNTER — Other Ambulatory Visit: Payer: Self-pay | Admitting: Specialist

## 2020-10-09 DIAGNOSIS — R0609 Other forms of dyspnea: Secondary | ICD-10-CM

## 2020-10-09 DIAGNOSIS — R0600 Dyspnea, unspecified: Secondary | ICD-10-CM

## 2020-10-13 ENCOUNTER — Ambulatory Visit: Payer: Medicaid Other | Admitting: Specialist

## 2020-10-13 ENCOUNTER — Telehealth: Payer: Self-pay | Admitting: Specialist

## 2020-10-13 NOTE — Telephone Encounter (Signed)
Pt would like to know if she could be worked in with [place] next week since he had to cancel today?   CB [phone]

## 2020-10-14 ENCOUNTER — Other Ambulatory Visit: Payer: Self-pay | Admitting: Family Medicine

## 2020-10-14 DIAGNOSIS — Z1231 Encounter for screening mammogram for malignant neoplasm of breast: Secondary | ICD-10-CM

## 2020-10-14 NOTE — Telephone Encounter (Signed)
I called but MV is full ---- I have added her to the cancellation list and will call if something opens up.

## 2020-10-15 DIAGNOSIS — F4310 Post-traumatic stress disorder, unspecified: Secondary | ICD-10-CM | POA: Diagnosis not present

## 2020-10-15 DIAGNOSIS — F331 Major depressive disorder, recurrent, moderate: Secondary | ICD-10-CM | POA: Diagnosis not present

## 2020-10-17 ENCOUNTER — Other Ambulatory Visit: Payer: Self-pay

## 2020-10-17 ENCOUNTER — Ambulatory Visit
Admission: RE | Admit: 2020-10-17 | Discharge: 2020-10-17 | Disposition: A | Discharge status: Home / Self Care | Payer: Medicaid Other | Source: Ambulatory Visit

## 2020-10-17 DIAGNOSIS — Z1231 Encounter for screening mammogram for malignant neoplasm of breast: Secondary | ICD-10-CM | POA: Diagnosis not present

## 2020-10-17 NOTE — Telephone Encounter (Signed)
"  when I go walking I seem to have to use the albuterol a couple of times. I have the annoro and the albuterol. Do you think it is possible for me to try the Trelegy?"  Please advise. Thank you!

## 2020-10-21 DIAGNOSIS — Z79899 Other long term (current) drug therapy: Secondary | ICD-10-CM | POA: Diagnosis not present

## 2020-10-22 ENCOUNTER — Telehealth: Payer: Self-pay | Admitting: *Deleted

## 2020-10-22 ENCOUNTER — Encounter: Payer: Medicaid Other | Attending: Family Medicine | Admitting: Registered"

## 2020-10-22 ENCOUNTER — Encounter: Payer: Self-pay | Admitting: Registered"

## 2020-10-22 ENCOUNTER — Other Ambulatory Visit: Payer: Self-pay

## 2020-10-22 DIAGNOSIS — E7800 Pure hypercholesterolemia, unspecified: Secondary | ICD-10-CM | POA: Diagnosis not present

## 2020-10-22 NOTE — Telephone Encounter (Signed)
Message sent to patient via mychart where message originated.

## 2020-10-22 NOTE — Patient Instructions (Signed)
-   Aim to increase fiber intake with whole grain bread, crackers, cereals, fruit, vegetables, and nuts.   - Choose lean proteins with meals.   - Balance out your meals to include lean protein, fruit, vegetable, grains/starch, and low-fat dairy source.

## 2020-10-22 NOTE — Telephone Encounter (Signed)
Patient Message Open  10/17/2020 [facility] Pulmonary Care Mychart, Generic Provider   Conversation: Inhaler (Newest Message First) October 22, 2020 Me to [name], [name]   HF   3:13 PM Good afternoon, I apologize for the delay.  Judson Roch is not always in the office, she works in the hospital as well.  I sent her a message and I am working with her in the office today.  Thank you for your patience.  This MyChart message has not been read. Cheryl King to Cheryl King [facility] Pool (supporting Cheryl Spatz, NP)     12:36 PM I'm still waiting to find out about the Trelogy October 17, 2020      2:46 PM Cheryl King, CMA routed this conversation to Cheryl Spatz, NP  Fritzi Mandes D, CMA     2:46 PM Note   "when I go walking I seem to have to use the albuterol a couple of times. I have the annoro and the albuterol. Do you think it is possible for me to try the Trelegy?"   Please advise. Thank you!      Cheryl King, CMA to Cheryl King     2:46 PM Hello Ms. [name]!    We will send your message back to Cheryl Form NP and let you know what her response is. Please allow 48 hours for a response.    Thanks and have a great day!     Last read by Cheryl King at 12:36 PM on 10/22/2020. Cheryl King to Cheryl King [facility] (supporting Cheryl Spatz, NP)     1:24 PM Hi Cheryl King when I go walking I seem to have to use the albuterol a couple of times. I have the annoro and the albuterol. Do you think it is possible for me to try the Trelogy?

## 2020-10-22 NOTE — Progress Notes (Signed)
Medical Nutrition Therapy  Appointment Start time:  9:00  Appointment End time:  10:10  Primary concerns today: knowing that she is trying to eat better  Referral diagnosis: hypercholesterolemia Preferred learning style: no preference indicated Learning readiness:  ready, change in progress   NUTRITION ASSESSMENT   Pt arrives stating she has a therapist (Ernestine Mcmurray, [facility]). States she has appointments bi-weekly via phone.   Pt states her lower back pain affects sleeping at night. States she didn't sleep well last night due to headaches.  States she needs to eat right and lose some pounds with hopes that it would take away some of her back pain. States Saturday is her cheat day. States during the week she will not eat beef but will eat chicken, pork, or salmon. Reports she loves rice, pasta, and bread. States she has been trying to eat right for high cholesterol. Reports trying brown rice but does not like it. States she uses almond milk and olive oil when cooking. States she does not like oatmeal because she ate it too much during childhood.   Pt states she is currently not working. Receives disability. States she has some increased stress and depression due to living arrangements and needing to find somewhere else to live but does not have options at the moment.    States she tries to do physical therapy exercises for physical activity. States she has been packing lately and raking leaves.    Clinical Medical Hx: hypercholesterolemia, GERD, prediabetes,  Medications: See list Labs: elevated LDL Chol Calc (120) Notable Signs/Symptoms: none reported  Lifestyle & Dietary Hx  Estimated daily fluid intake: 144+ oz Supplements: See list Sleep: sleeps about 5-6 hrs/night Stress / self-care: therapy, watches movies, journaling, plays with cat and dog Current average weekly physical activity: yard work sometimes, packing at home  24-Hr Dietary Recall First  Meal (11 am): coffee + 2 bagels with light cream cheese Snack:  Second Meal (4 pm): 1 slice of pepperoni pizza Snack:  Third Meal (7:30 pm): baked chicken breast + jasmine rice + black beans Snack: Kit Kat bar  Beverages: coffee, water (6-7*24 oz; 144-168 oz)   NUTRITION DIAGNOSIS  NB-1.1 Food and nutrition-related knowledge deficit As related to hypercholesterolemia.  As evidenced by lack of previous nutrition-related education.   NUTRITION INTERVENTION  Nutrition education (E-1) on the following topics: Nutrition education and counseling. Pt was educated and counseled on high cholesterol, nutritional ways to lower cholesterol, ways to increase fiber intake, and ways to increase physical activity. Pt was encouraged to continue having 3 meals/day and staying hydrated. Pt agreed with goals listed.   Handouts Provided Include  Cholesterol-Lowering Nutrition Therapy  Learning Style & Readiness for Change Teaching method utilized: Visual & Auditory  Demonstrated degree of understanding via: Teach Back  Barriers to learning/adherence to lifestyle change: none identified  Goals Established by Pt Aim to increase fiber intake with whole grain bread, crackers, cereals, fruit, vegetables, and nuts.  Choose lean proteins with meals.  Balance out your meals to include lean protein, fruit, vegetable, grains/starch, and low-fat dairy source.    MONITORING & EVALUATION Dietary intake, weekly physical activity.  Next Steps  Patient is to follow-up in 1 month.

## 2020-10-23 ENCOUNTER — Ambulatory Visit: Payer: Medicaid Other

## 2020-10-23 DIAGNOSIS — I2584 Coronary atherosclerosis due to calcified coronary lesion: Secondary | ICD-10-CM

## 2020-10-23 DIAGNOSIS — I2510 Atherosclerotic heart disease of native coronary artery without angina pectoris: Secondary | ICD-10-CM | POA: Diagnosis not present

## 2020-10-23 LAB — PCV CARDIAC STRESS TEST
Angina Index: 0
ST Depression (mm): 0 mm

## 2020-10-23 MED ORDER — TRELEGY ELLIPTA 100-62.5-25 MCG/INH IN AEPB: 1.0000 | INHALATION_SPRAY | Freq: Every day | RESPIRATORY_TRACT | 0 refills | Status: DC

## 2020-10-29 DIAGNOSIS — F331 Major depressive disorder, recurrent, moderate: Secondary | ICD-10-CM | POA: Diagnosis not present

## 2020-10-29 DIAGNOSIS — F4310 Post-traumatic stress disorder, unspecified: Secondary | ICD-10-CM | POA: Diagnosis not present

## 2020-11-05 ENCOUNTER — Telehealth: Payer: Self-pay | Admitting: Pharmacy Technician

## 2020-11-05 ENCOUNTER — Other Ambulatory Visit (HOSPITAL_COMMUNITY): Payer: Self-pay

## 2020-11-05 NOTE — Telephone Encounter (Signed)
Magdalen Spatz, NP  You Just now (5:23 PM)   None of these are equivalent triple therapy.They have to have a triple therapy available to use so it is an equivalent.     Routing back to pharmacy prior auth pool. Please see if there is another triple therapy that would be approved for pt.

## 2020-11-05 NOTE — Telephone Encounter (Signed)
Baker, Monchell R, CPhT  [facility] Triage Pool 50 minutes ago (4:30 PM)    Alternatives that are preferred would be:  Advair Diskus or HFA  [facility]  Symbicort

## 2020-11-05 NOTE — Telephone Encounter (Signed)
Patient Advocate Encounter  Received notification from [facility] that prior authorization for TRELEGY is required.   PA submitted on 10.19.22 [place] [id number] Status is pending   [facility] will continue to follow  Luciano Cutter, CPhT Patient Advocate Phone: [phone] Fax:  [phone]  IF APPROVED THIS MAY PRESENT A HIGHER CO-PAY FOR THE PATIENT DUE TO IT BEING NON-PREFERRED

## 2020-11-05 NOTE — Telephone Encounter (Signed)
Routing to Judson Roch so she can see the info from pharmacy about the prior auth to see what she recommends. PA is still pending that was submitted.

## 2020-11-07 ENCOUNTER — Telehealth: Payer: Self-pay | Admitting: Pulmonary Disease

## 2020-11-07 NOTE — Telephone Encounter (Signed)
Called patient but she did not answer. Left message for her to call us back.  

## 2020-11-07 NOTE — Telephone Encounter (Signed)
See message from pharmacy team.

## 2020-11-07 NOTE — Telephone Encounter (Signed)
Received a fax regarding Prior Authorization from [place] for [place]. Authorization has been DENIED because:  PT MUST TRY AND FAIL THE OTHER APPROVED MEDICATIONS BEFORE THEY WILL APPROVE TRELEGY. PLEASE SEE NOTES.

## 2020-11-07 NOTE — Telephone Encounter (Signed)
PA for Trelegy was denied.  She has to have tried/failed two of the following:  Advair diskus, Advair HFA, Dulera, Symbicort.  Sarah, do you want to change the patient's inhaler?  Please advise.  Thank you.

## 2020-11-10 ENCOUNTER — Encounter: Payer: Self-pay | Admitting: Cardiology

## 2020-11-10 ENCOUNTER — Other Ambulatory Visit: Payer: Self-pay

## 2020-11-10 ENCOUNTER — Ambulatory Visit: Payer: Medicaid Other | Admitting: Cardiology

## 2020-11-10 VITALS — BP 103/62 | HR 76 | Temp 98.0°F | Resp 16 | Ht 65.0 in | Wt 157.6 lb

## 2020-11-10 DIAGNOSIS — I2510 Atherosclerotic heart disease of native coronary artery without angina pectoris: Secondary | ICD-10-CM

## 2020-11-10 DIAGNOSIS — R0609 Other forms of dyspnea: Secondary | ICD-10-CM

## 2020-11-10 DIAGNOSIS — Z720 Tobacco use: Secondary | ICD-10-CM

## 2020-11-10 DIAGNOSIS — E7800 Pure hypercholesterolemia, unspecified: Secondary | ICD-10-CM

## 2020-11-10 MED ORDER — NICOTINE 21 MG/24HR TD PT24: 21.0000 mg | MEDICATED_PATCH | Freq: Every day | TRANSDERMAL | 0 refills | Status: DC

## 2020-11-10 MED ORDER — EZETIMIBE 10 MG PO TABS: 10.0000 mg | ORAL_TABLET | Freq: Every day | ORAL | 1 refills | Status: DC

## 2020-11-10 NOTE — Progress Notes (Signed)
Primary Physician/Referring:  Charlott Rakes, MD  Patient ID: Cheryl King, female    DOB: September 15, 1965, 55 y.o.   MRN: [id number]  Chief Complaint  Patient presents with   Shortness of Breath   Coronary Calcification   Hyperlipidemia   Follow-up    6 weeks     HPI:    Cheryl King  is a 55 y.o. African-American female patient with long history of tobacco use disorder, hyperlipidemia, shortness of breath secondary to COPD presents for follow-up evaluation of shortness of breath and coronary calcification and aortic atherosclerosis noted on the recent CT scan of the chest.  Except for shortness of breath on exertion she remains asymptomatic.    Past Medical History:  Diagnosis Date   Acid reflux    Allergy    Carpal tunnel syndrome    COPD (chronic obstructive pulmonary disease) (HCC)    Depression    Diverticulitis    Hyperlipidemia    Low back pain    Neck pain    Sacroiliac inflammation (HCC)    Sciatica    Seasonal allergies    Past Surgical History:  Procedure Laterality Date   ABDOMINAL SURGERY     ANKLE SURGERY     lt.   BREAST EXCISIONAL BIOPSY Right    pt states years ago- not sure when   PARTIAL HYSTERECTOMY     right breast cyst removed      URETHRAL DIVERTICULUM REPAIR     uretrral diverticultis   Family History  Problem Relation Age of Onset   Healthy Mother    Other Father        Unsure of medical history   Asthma Maternal Aunt    Diabetes Maternal Aunt    Cancer Maternal Aunt    Breast cancer Maternal Aunt    Hypertension Maternal Grandmother    Heart Problems Maternal Grandmother    Hypertension Other    COPD Other    Colon cancer Neg Hx    Pancreatic cancer Neg Hx    Rectal cancer Neg Hx    Stomach cancer Neg Hx    Colon polyps Neg Hx    Esophageal cancer Neg Hx     Social History   Tobacco Use   Smoking status: Some Days    Packs/day: 0.50    Years: 35.00    Pack years: 17.50    Types: Cigarettes    Start date:  47   Smokeless tobacco: Never   Tobacco comments:    Wears patcches, 1 or 2 ciggs daily  Substance Use Topics   Alcohol use: Yes    Comment: Social only - seldom   Marital Status: Married  ROS  Review of Systems  Cardiovascular:  Positive for dyspnea on exertion. Negative for chest pain and leg swelling.  Musculoskeletal:  Positive for back pain (chronic and sciatica).  Gastrointestinal:  Negative for melena.  Objective  Blood pressure 103/62, pulse 76, temperature 98 F (36.7 C), temperature source Temporal, resp. rate 16, height 5\' 5"  (1.651 m), weight 157 lb 9.6 oz (71.5 kg), SpO2 94 %. Body mass index is 26.23 kg/m.  Vitals with BMI 11/10/2020 09/29/2020 09/29/2020  Height 5\' 5"  5\' 5"  5\' 5"   Weight 157 lbs 10 oz 155 lbs 13 oz 155 lbs 6 oz  BMI 26.23 63.87 56.43  Systolic 329 518 841  Diastolic 62 73 62  Pulse 76 71 80     Physical Exam Neck:     Vascular: No carotid bruit or  JVD.  Cardiovascular:     Rate and Rhythm: Normal rate and regular rhythm.     Pulses: Intact distal pulses.     Heart sounds: Normal heart sounds. No murmur heard.   No gallop.  Pulmonary:     Effort: Pulmonary effort is normal.     Breath sounds: Normal breath sounds.  Abdominal:     General: Bowel sounds are normal.     Palpations: Abdomen is soft.  Musculoskeletal:        General: No swelling.     Laboratory examination:   Recent Labs    01/28/20 1012  NA 138  K 4.4  CL 102  CO2 23  GLUCOSE 84  BUN 22  CREATININE 0.79  CALCIUM 9.5  GFRNONAA 84  GFRAA 97   CrCl cannot be calculated (Patient's most recent lab result is older than the maximum 21 days allowed.).  CMP Latest Ref Rng & Units 01/28/2020 07/04/2019 09/20/2018  Glucose 65 - 99 mg/dL 84 86 92  BUN 6 - 24 mg/dL 22 23 17   Creatinine 7.06 - 1.00 mg/dL 0.79 0.82 0.72  Sodium 134 - 144 mmol/L 138 139 141  Potassium 3.5 - 5.2 mmol/L 4.4 4.9 4.9  Chloride 96 - 106 mmol/L 102 101 102  CO2 20 - 29 mmol/L 23 26 27   Calcium 8.7  - 10.2 mg/dL 9.5 9.8 9.7  Total Protein 6.0 - 8.5 g/dL 6.9 7.3 6.7  Total Bilirubin 0.0 - 1.2 mg/dL 0.4 0.3 0.3  Alkaline Phos 44 - 121 IU/L 106 108 113  AST 0 - 40 IU/L 22 22 28   ALT 0 - 32 IU/L 16 12 27    CBC Latest Ref Rng & Units 04/18/2017 05/03/2016 04/13/2013  WBC 3.4 - 10.8 x10E3/uL 8.6 7.6 9.3  Hemoglobin 11.1 - 15.9 g/dL 14.9 14.9 15.9(H)  Hematocrit 34.0 - 46.6 % 44.7 43.8 46.0  Platelets 150 - 379 x10E3/uL 236 246 226    Lipid Panel Recent Labs    01/28/20 1012 09/30/20 1119  CHOL 191 197  TRIG 74 80  LDLCALC 117* 120*  HDL 60 63  CHOLHDL 3.2  --    Lipid Panel     Component Value Date/Time   CHOL 197 09/30/2020 1119   TRIG 80 09/30/2020 1119   HDL 63 09/30/2020 1119   CHOLHDL 3.2 01/28/2020 1012   CHOLHDL 3.2 12/26/2013 1319   VLDL 18 12/26/2013 1319   LDLCALC 120 (H) 09/30/2020 1119   LABVLDL 14 09/30/2020 1119     HEMOGLOBIN A1C Lab Results  Component Value Date   HGBA1C 5.6 (A) 07/04/2019   TSH Recent Labs    09/30/20 1119  TSH 0.893    Medications and allergies   Allergies  Allergen Reactions   Latex Other (See Comments)    Pt unsure of reaction   Percocet [Oxycodone-Acetaminophen] Nausea Only   Vicodin [Hydrocodone-Acetaminophen] Nausea Only   Penicillins Rash     Medication prior to this encounter:   Outpatient Medications Prior to Visit  Medication Sig Dispense Refill   Acetaminophen-Codeine 300-30 MG tablet TAKE 1 TABLET BY MOUTH 2 (TWO) TIMES DAILY AS NEEDED. FOR PAIN 30 tablet 0   amitriptyline (ELAVIL) 50 MG tablet TAKE 1 TABLET (50 MG TOTAL) BY MOUTH AT BEDTIME. 90 tablet 1   atorvastatin (LIPITOR) 20 MG tablet TAKE 1 TABLET (20 MG TOTAL) BY MOUTH DAILY. 90 tablet 1   cetirizine (ZYRTEC) 10 MG tablet Take 1 tablet (10 mg total) by mouth daily. 90 tablet  1   cyclobenzaprine (FLEXERIL) 10 MG tablet TAKE 1 TABLET (10 MG TOTAL) BY MOUTH 2 (TWO) TIMES DAILY AS NEEDED FOR MUSCLE SPASMS. 180 tablet 1   diclofenac sodium (VOLTAREN) 1  % GEL Apply 2 g topically 4 (four) times daily. 5 Tube 2   DULoxetine (CYMBALTA) 60 MG capsule TAKE 1 CAPSULE (60 MG TOTAL) BY MOUTH DAILY. 90 capsule 1   fluticasone (FLONASE) 50 MCG/ACT nasal spray PLACE 2 SPRAYS INTO BOTH NOSTRILS DAILY. 16 g 2   Fluticasone-Umeclidin-Vilant (TRELEGY ELLIPTA) 100-62.5-25 MCG/INH AEPB Inhale 1 puff into the lungs daily at 6 (six) AM. 1 each 0   Lactobacillus CAPS Take 1 capsule by mouth daily.     lidocaine (LIDODERM) 5 % PLACE 1 PATCH ONTO THE SKIN DAILY. REMOVE & DISCARD PATCH WITHIN 12 HOURS OR AS DIRECTED BY MD 30 patch 6   Multiple Vitamin (MULTIVITAMIN WITH MINERALS) TABS tablet Take 1 tablet by mouth daily.     naproxen (NAPROSYN) 500 MG tablet TAKE 1 TABLET BY MOUTH TWO TIMES DAILY WITH MEALS 60 tablet 3   oxybutynin (DITROPAN) 5 MG tablet TAKE 1 TABLET (5 MG TOTAL) BY MOUTH 2 (TWO) TIMES DAILY. 180 tablet 1   pregabalin (LYRICA) 75 MG capsule Take 1 capsule (75 mg total) by mouth 2 (two) times daily. 60 capsule 5   RESTASIS 0.05 % ophthalmic emulsion   3   triamcinolone cream (KENALOG) 0.1 % Apply 1 application topically 2 (two) times daily. 45 g 1   umeclidinium-vilanterol (ANORO ELLIPTA) 62.5-25 MCG/INH AEPB Inhale 1 puff into the lungs daily. 60 each 5   Fluticasone-Umeclidin-Vilant (TRELEGY ELLIPTA) 100-62.5-25 MCG/INH AEPB Inhale 1 puff into the lungs daily. 14 each 0   phenazopyridine (PYRIDIUM) 200 MG tablet Take 1 tablet (200 mg total) by mouth 3 (three) times daily as needed for pain. 6 tablet 0   Facility-Administered Medications Prior to Visit  Medication Dose Route Frequency Provider Last Rate Last Admin   0.9 %  sodium chloride infusion  500 mL Intravenous Once Doran Stabler, MD         Medication list after today's encounter   Current Outpatient Medications  Medication Instructions   Acetaminophen-Codeine 300-30 MG tablet 1 tablet, Oral, 2 times daily PRN, for pain   amitriptyline (ELAVIL) 50 MG tablet TAKE 1 TABLET (50 MG  TOTAL) BY MOUTH AT BEDTIME.   atorvastatin (LIPITOR) 20 MG tablet TAKE 1 TABLET (20 MG TOTAL) BY MOUTH DAILY.   cetirizine (ZYRTEC) 10 mg, Oral, Daily   cyclobenzaprine (FLEXERIL) 10 MG tablet TAKE 1 TABLET (10 MG TOTAL) BY MOUTH 2 (TWO) TIMES DAILY AS NEEDED FOR MUSCLE SPASMS.   diclofenac sodium (VOLTAREN) 2 g, Topical, 4 times daily   DULoxetine (CYMBALTA) 60 MG capsule TAKE 1 CAPSULE (60 MG TOTAL) BY MOUTH DAILY.   ezetimibe (ZETIA) 10 mg, Oral, Daily after supper   fluticasone (FLONASE) 50 MCG/ACT nasal spray PLACE 2 SPRAYS INTO BOTH NOSTRILS DAILY.   Fluticasone-Umeclidin-Vilant (TRELEGY ELLIPTA) 100-62.5-25 MCG/INH AEPB 1 puff, Inhalation, Daily   Lactobacillus CAPS 1 capsule, Daily   lidocaine (LIDODERM) 5 % PLACE 1 PATCH ONTO THE SKIN DAILY. REMOVE & DISCARD PATCH WITHIN 12 HOURS OR AS DIRECTED BY MD   Multiple Vitamin (MULTIVITAMIN WITH MINERALS) TABS tablet 1 tablet, Daily   naproxen (NAPROSYN) 500 MG tablet TAKE 1 TABLET BY MOUTH TWO TIMES DAILY WITH MEALS   nicotine (NICODERM CQ) 21 mg, Transdermal, Daily   oxybutynin (DITROPAN) 5 MG tablet TAKE 1 TABLET (5  MG TOTAL) BY MOUTH 2 (TWO) TIMES DAILY.   pregabalin (LYRICA) 75 mg, Oral, 2 times daily   RESTASIS 0.05 % ophthalmic emulsion No dose, route, or frequency recorded.   triamcinolone cream (KENALOG) 0.1 % 1 application, Topical, 2 times daily   umeclidinium-vilanterol (ANORO ELLIPTA) 62.5-25 MCG/INH AEPB 1 puff, Inhalation, Daily    Radiology:   CT chest without contrast 09/03/2020: 1. Signs of pulmonary emphysema as before. 2. 5 mm nodule along the posterior RIGHT lower lobe, unchanged since June of 2021. Considered benign. 3. Central pulmonary vasculature is engorged at 3.6 cm, increased compared to previous imaging, can be seen with pulmonary arterial hypertension. 4. Coronary artery calcifications. 5. Aortic atherosclerosis.  Cardiac Studies:   [facility] CARDIAC STRESS TEST 10/23/2020  Narrative Exercise treadmill  stress test 10/23/2020: Exercise treadmill stress test performed using Bruce protocol.  Patient reached 4.9 METS, and 88% of age predicted maximum heart rate.  Exercise capacity was low.  No chest pain reported, dyspnea reported. Normal heart rate and hemodynamic response. Peak stress EKG difficult to interpret due to artifact. EKG at 1 min into recovery shows sinus tachycardia, no significant ST-T wave abnormality. Recommend clinical correlation due to low exercise capacity.   [facility] ECHOCARDIOGRAM COMPLETE 10/09/2020  Narrative Echocardiogram 10/09/2020: Normal LV systolic function with EF 60%. Left ventricle cavity is normal in size. Normal left ventricular wall thickness. Normal global wall motion. Normal diastolic filling pattern. Calculated EF 60%. Normal echocardiogram.     EKG:   Sinus rhythm with short PR interval at a rate of 64 bpm, normal axis, no evidence of ischemia. No significant change from 12/26/2013.  Assessment     ICD-10-CM   1. Coronary artery calcification  I25.10    I25.84     2. Dyspnea on exertion  R06.09     3. Hypercholesteremia  E78.00 ezetimibe (ZETIA) 10 MG tablet    4. Tobacco abuse  Z72.0 nicotine (NICODERM CQ) 21 mg/24hr patch      Medications Discontinued During This Encounter  Medication Reason   Fluticasone-Umeclidin-Vilant (TRELEGY ELLIPTA) 100-62.5-25 MCG/INH AEPB Error   phenazopyridine (PYRIDIUM) 200 MG tablet Error    Meds ordered this encounter  Medications   ezetimibe (ZETIA) 10 MG tablet    Sig: Take 1 tablet (10 mg total) by mouth daily after supper.    Dispense:  30 tablet    Refill:  1   nicotine (NICODERM CQ) 21 mg/24hr patch    Sig: Place 1 patch (21 mg total) onto the skin daily.    Dispense:  28 patch    Refill:  0    No orders of the defined types were placed in this encounter.  Recommendations:   Cheryl King is a 55 y.o. African-American female patient with long history of tobacco use disorder, hyperlipidemia,  shortness of breath secondary to COPD presents for follow-up evaluation of shortness of breath and coronary calcification and aortic atherosclerosis noted on the recent CT scan of the chest.  Had seen her 6 weeks ago.  I reviewed the results of the stress test, she has markedly reduced exercise tolerance probably related to underlying lung disease and fortunately there was no arrhythmias or significant ST-T wave changes of ischemia.  Hence continue primary prevention is indicated with regard to aortic atherosclerosis/coronary atherosclerosis and evaluation for noncardiac etiology for dyspnea including underlying COPD.  Smoking cessation again discussed with the patient, she had quit smoking on her last office visit but has started to smoke again.  Implications of COPD  and emphysema discussed with the patient.  She would like to try nicotine patches which have been prescribed today.  Reviewed her lipids, LDL is still elevated, in view of coronary calcification, will add Zetia and she can have follow-up labs with her PCP in 4 to 6 weeks and if LDL is at goal to closer to 70, she can have refills with her PCP and I will see her back on a as needed basis.     Adrian Prows, MD, [facility] 11/10/2020, 6:22 PM Office: [phone]

## 2020-11-11 NOTE — Telephone Encounter (Signed)
Magdalen Spatz, NP  Rosana Berger, CMA Caller: Unspecified (4 days ago,  3:54 PM) 160 and 2.5

## 2020-11-11 NOTE — Telephone Encounter (Signed)
Per Sarah:  Symbicort and Spiriva so she gets the triple therapy  Judson Roch- please advise if you want the symbicort 160 or 80 and spiriva 1.25 or 2.5, thanks!

## 2020-11-11 NOTE — Telephone Encounter (Signed)
Tried calling the pt and there was no answer and her VM was full  Will call back

## 2020-11-12 NOTE — Addendum Note (Signed)
Addended by: Rosana Berger on: 11/12/2020 03:13 PM   Modules accepted: Orders

## 2020-11-12 NOTE — Telephone Encounter (Signed)
Spoke with the pt and notified of Sarah's response  She states that she prefers to stay on Anoro for now, it's working well for her and affordable  She has ov here 11/24/20  Will forward to SG as FYI, thanks!

## 2020-11-19 ENCOUNTER — Ambulatory Visit: Payer: Medicaid Other | Admitting: Specialist

## 2020-11-20 ENCOUNTER — Ambulatory Visit (INDEPENDENT_AMBULATORY_CARE_PROVIDER_SITE_OTHER): Payer: Medicaid Other | Admitting: Specialist

## 2020-11-20 ENCOUNTER — Other Ambulatory Visit: Payer: Self-pay

## 2020-11-20 ENCOUNTER — Encounter: Payer: Self-pay | Admitting: Specialist

## 2020-11-20 VITALS — BP 105/70 | HR 84 | Ht 65.0 in | Wt 158.0 lb

## 2020-11-20 DIAGNOSIS — M4722 Other spondylosis with radiculopathy, cervical region: Secondary | ICD-10-CM

## 2020-11-20 DIAGNOSIS — M7062 Trochanteric bursitis, left hip: Secondary | ICD-10-CM

## 2020-11-20 DIAGNOSIS — M5136 Other intervertebral disc degeneration, lumbar region: Secondary | ICD-10-CM | POA: Diagnosis not present

## 2020-11-20 DIAGNOSIS — M5416 Radiculopathy, lumbar region: Secondary | ICD-10-CM

## 2020-11-20 DIAGNOSIS — M48062 Spinal stenosis, lumbar region with neurogenic claudication: Secondary | ICD-10-CM | POA: Diagnosis not present

## 2020-11-20 DIAGNOSIS — M7061 Trochanteric bursitis, right hip: Secondary | ICD-10-CM

## 2020-11-20 NOTE — Patient Instructions (Signed)
Avoid bending, stooping and avoid lifting weights greater than 10 lbs. Avoid prolong standing and walking. Avoid frequent bending and stooping  No lifting greater than 10 lbs. May use ice or moist heat for pain. Weight loss is of benefit. Handicap license is approved. Dr. Romona Curls secretary/Assistant will call to arrange for epidural steroid injection   Ice to the hips.

## 2020-11-20 NOTE — Progress Notes (Signed)
Office Visit Note   Patient: Cheryl King           Date of Birth: 18-Oct-1965           MRN: [id number] Visit Date: 11/20/2020              Requested by: Charlott Rakes, MD [place],  [place] [address] PCP: Charlott Rakes, MD   Assessment & Plan: Visit Diagnoses:  1. Degenerative disc disease, lumbar   2. Spinal stenosis of lumbar region with neurogenic claudication   3. Other spondylosis with radiculopathy, cervical region   4. Radiculopathy, lumbar region   5. Greater trochanteric bursitis, left   6. Greater trochanteric bursitis, right   7. Trochanteric bursitis, right hip     Plan: void bending, stooping and avoid lifting weights greater than 10 lbs. Avoid prolong standing and walking. Avoid frequent bending and stooping  No lifting greater than 10 lbs. May use ice or moist heat for pain. Weight loss is of benefit. Handicap license is approved. Dr. Romona Curls secretary/Assistant will call to arrange for epidural steroid injection   Ice to the hips.   Follow-Up Instructions: Return in about 3 weeks (around 12/11/2020).   Orders:  No orders of the defined types were placed in this encounter.  No orders of the defined types were placed in this encounter.     Procedures: No procedures performed   Clinical Data: No additional findings.   Subjective: Chief Complaint  Patient presents with   Lower Back - Follow-up    MRI Review   Right Hip - Pain    Wants a troch injection   Left Hip - Pain    Wants a troch injection    55 year old female with back and buttock pain. Underwent MRI of lumbar spine. Complains of primary back pain that is constant. Worse with standing in one place, bending and stooping. Reports she is in a lot of pain. Would like injections of both hips. Takes Tylenol # 3 twice a day. Medications do not seen to help. No bowel or bladder difficutly.    Review of Systems  Constitutional: Negative.   HENT: Negative.     Eyes: Negative.   Respiratory: Negative.    Cardiovascular: Negative.   Gastrointestinal: Negative.   Endocrine: Negative.   Genitourinary: Negative.   Musculoskeletal: Negative.   Skin: Negative.   Allergic/Immunologic: Negative.   Neurological: Negative.   Hematological: Negative.   Psychiatric/Behavioral: Negative.      Objective: Vital Signs: BP 105/70 (BP Location: Right Arm, Patient Position: Sitting)   Pulse 84   Ht 5\' 5"  (1.651 m)   Wt 158 lb (71.7 kg)   BMI 26.29 kg/m   Physical Exam Constitutional:      Appearance: She is well-developed.  HENT:     Head: Normocephalic and atraumatic.  Eyes:     Pupils: Pupils are equal, round, and reactive to light.  Pulmonary:     Effort: Pulmonary effort is normal.     Breath sounds: Normal breath sounds.  Abdominal:     General: Bowel sounds are normal.     Palpations: Abdomen is soft.  Musculoskeletal:     Cervical back: Normal range of motion and neck supple.     Lumbar back: Positive right straight leg raise test and positive left straight leg raise test.  Skin:    General: Skin is warm and dry.  Neurological:     Mental Status: She is alert and oriented  to person, place, and time.  Psychiatric:        Behavior: Behavior normal.        Thought Content: Thought content normal.        Judgment: Judgment normal.    Back Exam   Tenderness  The patient is experiencing tenderness in the lumbar and sacroiliac.  Range of Motion  Extension:  abnormal  Flexion:  abnormal  Lateral bend right:  abnormal  Lateral bend left:  abnormal  Rotation right:  abnormal  Rotation left:  abnormal   Muscle Strength  Right Quadriceps:  5/5  Left Quadriceps:  5/5  Right Hamstrings:  5/5  Left Hamstrings:  5/5   Tests  Straight leg raise right: positive Straight leg raise left: positive  Reflexes  Patellar:  0/4 Achilles:  0/4 Biceps:  0/4 Babinski's sign: normal   Other  Toe walk: abnormal Heel walk:  abnormal Sensation: normal Gait: abnormal  Erythema: no back redness Scars: absent     Specialty Comments:  No specialty comments available.  Imaging: No results found.   PMFS History: Patient Active Problem List   Diagnosis Date Noted   Centrilobular emphysema ([place]) 07/29/2020   Multiple nodules of lung 07/29/2020   Hyperlipidemia 07/18/2017   Ulnar neuropathy 04/18/2017   GERD (gastroesophageal reflux disease) 10/19/2016   Prediabetes 05/03/2016   Cubital tunnel syndrome on right 03/01/2016   Numbness and tingling of right arm 01/01/2016   Chlamydia infection 01/27/2015   Tobacco abuse 12/26/2013   Chronic lower back pain 04/13/2013   Sciatica 04/13/2013   Chronic neck pain 04/13/2013   Spondylosis of cervical spine 04/13/2013   Past Medical History:  Diagnosis Date   Acid reflux    Allergy    Carpal tunnel syndrome    COPD (chronic obstructive pulmonary disease) (HCC)    Depression    Diverticulitis    Hyperlipidemia    Low back pain    Neck pain    Sacroiliac inflammation ([place])    Sciatica    Seasonal allergies     Family History  Problem Relation Age of Onset   Healthy Mother    Other Father        Unsure of medical history   Asthma Maternal Aunt    Diabetes Maternal Aunt    Cancer Maternal Aunt    Breast cancer Maternal Aunt    Hypertension Maternal Grandmother    Heart Problems Maternal Grandmother    Hypertension Other    COPD Other    Colon cancer Neg Hx    Pancreatic cancer Neg Hx    Rectal cancer Neg Hx    Stomach cancer Neg Hx    Colon polyps Neg Hx    Esophageal cancer Neg Hx     Past Surgical History:  Procedure Laterality Date   ABDOMINAL SURGERY     ANKLE SURGERY     lt.   BREAST EXCISIONAL BIOPSY Right    pt states years ago- not sure when   PARTIAL HYSTERECTOMY     right breast cyst removed      URETHRAL DIVERTICULUM REPAIR     uretrral diverticultis   Social History   Occupational History   Occupation: Unemployed   Tobacco Use   Smoking status: Some Days    Packs/day: 0.50    Years: 35.00    Pack years: 17.50    Types: Cigarettes    Start date: 1981   Smokeless tobacco: Never   Tobacco comments:    Wears  patcches, 1 or 2 ciggs daily  Vaping Use   Vaping Use: Never used  Substance and Sexual Activity   Alcohol use: Yes    Comment: Social only - seldom   Drug use: No   Sexual activity: Never

## 2020-11-24 ENCOUNTER — Other Ambulatory Visit: Payer: Self-pay

## 2020-11-24 ENCOUNTER — Ambulatory Visit (INDEPENDENT_AMBULATORY_CARE_PROVIDER_SITE_OTHER): Payer: Medicaid Other | Admitting: Acute Care

## 2020-11-24 ENCOUNTER — Encounter: Payer: Self-pay | Admitting: Acute Care

## 2020-11-24 VITALS — BP 116/68 | HR 65 | Temp 98.6°F | Ht 65.0 in | Wt 156.8 lb

## 2020-11-24 DIAGNOSIS — R0600 Dyspnea, unspecified: Secondary | ICD-10-CM | POA: Diagnosis not present

## 2020-11-24 DIAGNOSIS — J449 Chronic obstructive pulmonary disease, unspecified: Secondary | ICD-10-CM | POA: Diagnosis not present

## 2020-11-24 DIAGNOSIS — Z720 Tobacco use: Secondary | ICD-10-CM

## 2020-11-24 DIAGNOSIS — F17210 Nicotine dependence, cigarettes, uncomplicated: Secondary | ICD-10-CM | POA: Diagnosis not present

## 2020-11-24 MED ORDER — TRELEGY ELLIPTA 200-62.5-25 MCG/ACT IN AEPB: 1.0000 | INHALATION_SPRAY | Freq: Every day | RESPIRATORY_TRACT | 0 refills | Status: DC

## 2020-11-24 MED ORDER — ALBUTEROL SULFATE HFA 108 (90 BASE) MCG/ACT IN AERS
2.0000 | INHALATION_SPRAY | Freq: Four times a day (QID) | RESPIRATORY_TRACT | 5 refills | Status: DC | PRN
Start: 2020-11-24 — End: 2021-07-03

## 2020-11-24 NOTE — Progress Notes (Signed)
History of Present Illness Cheryl King is a 55 y.o. female current every day smoker  seen 07/29/2020 by Dr. [name] for dyspnea on exertion/ COPD.  Synopsis: Smokes 1/2 PPD for 40 years. States she has had worsening dyspnea with exertion recently. She does have cats and a dog. PFT's 09/2020 show severe COPD.  She is wearing 21 mg nicotine patches , but she is still smoking up to  5 cigarettes on some days.    11/24/2020 Pt. Presents for follow up. She is still smoking up to 5 cigarettes a day. . She is wearing 21 mg nicotine patches .She is compliant with her Anoro daily. She still has shortness of breath with walking and climbing up hills.She is asking for a stronger rescue than albuterol. I explained this is this strongest rescue available. We can consider nebs, but first I would like to get her on triple therapy maintenance. Medicaid has denied Trelegy. Pt. Will call and ask what they cover for her for triple therapy. She does have occasional wheezing. No fever, Normally clear secretions.   Test Results: 09/03/2020 CT Chest without contrast Signs of pulmonary emphysema as before. 2. 5 mm nodule along the posterior RIGHT lower lobe, unchanged since June of 2021. Considered benign. 3. Central pulmonary vasculature is engorged at 3.6 cm, increased compared to previous imaging, can be seen with pulmonary arterial hypertension. 4. Coronary artery calcifications. 5. Aortic atherosclerosis.  CBC Latest Ref Rng & Units 04/18/2017 05/03/2016 04/13/2013  WBC 3.4 - 10.8 x10E3/uL 8.6 7.6 9.3  Hemoglobin 11.1 - 15.9 g/dL 14.9 14.9 15.9(H)  Hematocrit 34.0 - 46.6 % 44.7 43.8 46.0  Platelets 150 - 379 x10E3/uL 236 246 226    BMP Latest Ref Rng & Units 01/28/2020 07/04/2019 09/20/2018  Glucose 65 - 99 mg/dL 84 86 92  BUN 6 - 24 mg/dL 22 23 17   Creatinine 0.57 - 1.00 mg/dL 0.79 0.82 0.72  BUN/Creat Ratio 9 - 23 28(H) 28(H) 24(H)  Sodium 134 - 144 mmol/L 138 139 141  Potassium 3.5 - 5.2 mmol/L 4.4  4.9 4.9  Chloride 96 - 106 mmol/L 102 101 102  CO2 20 - 29 mmol/L 23 26 27   Calcium 8.7 - 10.2 mg/dL 9.5 9.8 9.7    BNP No results found for: BNP  ProBNP No results found for: PROBNP  PFT    Component Value Date/Time   FEV1PRE 1.01 09/29/2020 0953   FEV1POST 1.14 09/29/2020 0953   FVCPRE 1.93 09/29/2020 0953   FVCPOST 2.10 09/29/2020 0953   TLC 5.32 09/29/2020 0953   DLCOUNC 7.02 09/29/2020 0953   PREFEV1FVCRT 52 09/29/2020 0953   PSTFEV1FVCRT 54 09/29/2020 0953    No results found.   Past medical hx Past Medical History:  Diagnosis Date   Acid reflux    Allergy    Carpal tunnel syndrome    COPD (chronic obstructive pulmonary disease) (HCC)    Depression    Diverticulitis    Hyperlipidemia    Low back pain    Neck pain    Sacroiliac inflammation (HCC)    Sciatica    Seasonal allergies      Social History   Tobacco Use   Smoking status: Some Days    Packs/day: 0.50    Years: 35.00    Pack years: 17.50    Types: Cigarettes    Start date: 54   Smokeless tobacco: Never   Tobacco comments:    Wears patcches, 1 or 2 ciggs daily  Vaping Use  Vaping Use: Never used  Substance Use Topics   Alcohol use: Yes    Comment: Social only - seldom   Drug use: No    Ms.Bacigalupo reports that she has been smoking cigarettes. She started smoking about 41 years ago. She has a 17.50 pack-year smoking history. She has never used smokeless tobacco. She reports current alcohol use. She reports that she does not use drugs.  Tobacco Cessation: Working on quitting. Down to 5 cigarettes daily. 30 + pack year smoking history Wearing 21 mg nicoderm patches. I have spent 3 minutes counseling patient on smoking cessation this visit. We have reviewed the risks of continued smoking on his current health situation. Patient verbalizes understanding of their  choice to continue smoking and the negative health consequences including worsening of COPD, risk of lung cancer , stroke  and heart disease.Marland Kitchen      Past surgical hx, Family hx, Social hx all reviewed.  Current Outpatient Medications on File Prior to Visit  Medication Sig   Acetaminophen-Codeine 300-30 MG tablet TAKE 1 TABLET BY MOUTH 2 (TWO) TIMES DAILY AS NEEDED. FOR PAIN   albuterol (VENTOLIN HFA) 108 (90 Base) MCG/ACT inhaler Inhale 2 puffs into the lungs every 6 (six) hours as needed for wheezing or shortness of breath.   amitriptyline (ELAVIL) 50 MG tablet TAKE 1 TABLET (50 MG TOTAL) BY MOUTH AT BEDTIME.   atorvastatin (LIPITOR) 20 MG tablet TAKE 1 TABLET (20 MG TOTAL) BY MOUTH DAILY.   cetirizine (ZYRTEC) 10 MG tablet Take 1 tablet (10 mg total) by mouth daily.   cyclobenzaprine (FLEXERIL) 10 MG tablet TAKE 1 TABLET (10 MG TOTAL) BY MOUTH 2 (TWO) TIMES DAILY AS NEEDED FOR MUSCLE SPASMS.   diclofenac sodium (VOLTAREN) 1 % GEL Apply 2 g topically 4 (four) times daily.   DULoxetine (CYMBALTA) 60 MG capsule TAKE 1 CAPSULE (60 MG TOTAL) BY MOUTH DAILY.   ezetimibe (ZETIA) 10 MG tablet Take 1 tablet (10 mg total) by mouth daily after supper.   fluticasone (FLONASE) 50 MCG/ACT nasal spray PLACE 2 SPRAYS INTO BOTH NOSTRILS DAILY.   Lactobacillus CAPS Take 1 capsule by mouth daily.   lidocaine (LIDODERM) 5 % PLACE 1 PATCH ONTO THE SKIN DAILY. REMOVE & DISCARD PATCH WITHIN 12 HOURS OR AS DIRECTED BY MD   Multiple Vitamin (MULTIVITAMIN WITH MINERALS) TABS tablet Take 1 tablet by mouth daily.   naproxen (NAPROSYN) 500 MG tablet TAKE 1 TABLET BY MOUTH TWO TIMES DAILY WITH MEALS   nicotine (NICODERM CQ) 21 mg/24hr patch Place 1 patch (21 mg total) onto the skin daily.   oxybutynin (DITROPAN) 5 MG tablet TAKE 1 TABLET (5 MG TOTAL) BY MOUTH 2 (TWO) TIMES DAILY.   pregabalin (LYRICA) 75 MG capsule Take 1 capsule (75 mg total) by mouth 2 (two) times daily.   RESTASIS 0.05 % ophthalmic emulsion    triamcinolone cream (KENALOG) 0.1 % Apply 1 application topically 2 (two) times daily.   umeclidinium-vilanterol (ANORO ELLIPTA)  62.5-25 MCG/INH AEPB Inhale 1 puff into the lungs daily.   Current Facility-Administered Medications on File Prior to Visit  Medication   0.9 %  sodium chloride infusion     Allergies  Allergen Reactions   Latex Other (See Comments)    Pt unsure of reaction   Percocet [Oxycodone-Acetaminophen] Nausea Only   Vicodin [Hydrocodone-Acetaminophen] Nausea Only   Penicillins Rash    Review Of Systems:  Constitutional:   No  weight loss, night sweats,  Fevers, chills, fatigue, or  lassitude.  HEENT:   No headaches,  Difficulty swallowing,  Tooth/dental problems, or  Sore throat,                No sneezing, itching, ear ache, nasal congestion, post nasal drip,   CV:  No chest pain,  Orthopnea, PND, swelling in lower extremities, anasarca, dizziness, palpitations, syncope.   GI  No heartburn, indigestion, abdominal pain, nausea, vomiting, diarrhea, change in bowel habits, loss of appetite, bloody stools.   Resp: + shortness of breath with exertion less at rest.  No excess mucus, + productive baseline cough,  No non-productive cough,  No coughing up of blood.  No change in color of mucus.  No wheezing.  No chest wall deformity  Skin: no rash or lesions.  GU: no dysuria, change in color of urine, no urgency or frequency.  No flank pain, no hematuria   MS:  No joint pain or swelling.  No decreased range of motion.  No back pain.  Psych:  No change in mood or affect. No depression or anxiety.  No memory loss.   Vital Signs BP 116/68 (BP Location: Right Arm, Patient Position: Sitting, Cuff Size: Normal)   Pulse 65   Temp 98.6 F (37 C) (Oral)   Ht 5\' 5"  (1.651 m)   Wt 156 lb 12.8 oz (71.1 kg)   SpO2 93%   BMI 26.09 kg/m    Physical Exam:  General- No distress,  A&Ox3, pleasant ENT: No sinus tenderness, TM clear, pale nasal mucosa, no oral exudate,no post nasal drip, no LAN Cardiac: S1, S2, regular rate and rhythm, no murmur Chest: No wheeze/ rales/ dullness; no accessory  muscle use, no nasal flaring, no sternal retractions Abd.: Soft Non-tender, ND, BS + Ext: No clubbing cyanosis, edema Neuro:  normal strength, MAE x 4, A&O x 3 Skin: No rashes, warm and dry Psych: normal mood and behavior   Assessment/Plan  Copd, Severe by PFT Dyspnea  Current everyday smoker Echo done 09/2020>> Normal echo per Dr. Tamsen Snider Plan We will renew your Albuterol ( 6 months) Use for rescue, no more than 3 times daily. We will refer to Pulmonary Rehab ( Under Dr. [name]) Please call your insurance and see what they will cover for triple therapy maintenance.  We will give you a sample of Trelegy today.  Stop Anoro, and start Trelegy samples.  One puff once daily, rinse mouth after use.  Continue Zyrtec for allergies.  Follow up lung cancer screening 09/2021>> ordered Follow up in 2 months with Davey Limas or Dr. [name]. Note your daily symptoms > remember "red flags" for COPD:  Increase in cough, increase in sputum production, increase in shortness of breath or activity intolerance. If you notice these symptoms, please call to be seen.    Please contact office for sooner follow up if symptoms do not improve or worsen or seek emergency care    I spent 35 minutes dedicated to the care of this patient on the date of this encounter to include pre-visit review of records, face-to-face time with the patient discussing conditions above, post visit ordering of testing, clinical documentation with the electronic health record, making appropriate referrals as documented, and communicating necessary information to the patient's healthcare team.    Magdalen Spatz, NP 11/24/2020  9:09 AM

## 2020-11-24 NOTE — Patient Instructions (Addendum)
We will renew your Albuterol ( 6 months) Use for rescue, no more than 3 times daily. We will refer to Pulmonary Rehab ( Under Dr. [name]) Please call your insurance and see what they will cover for triple therapy maintenance.  We will give you a sample of Trelegy today.  Stop Anoro, and start Trelegy samples.  One puff once daily, rinse mouth after use.  Continue Zyrtec for allergies.  Follow up in 2 months with Judson Roch or Dr. [name]. Note your daily symptoms > remember "red flags" for COPD:  Increase in cough, increase in sputum production, increase in shortness of breath or activity intolerance. If you notice these symptoms, please call to be seen.    Please contact office for sooner follow up if symptoms do not improve or worsen or seek emergency care

## 2020-11-25 ENCOUNTER — Telehealth: Payer: Self-pay | Admitting: Pharmacy Technician

## 2020-11-25 DIAGNOSIS — J3089 Other allergic rhinitis: Secondary | ICD-10-CM | POA: Diagnosis not present

## 2020-11-25 NOTE — Telephone Encounter (Signed)
SG please advise on the pts mychart message. thanks

## 2020-11-25 NOTE — Telephone Encounter (Signed)
Patient Advocate Encounter  Received notification from [employer] that prior authorization for TRELEGY is required.  *PLEASE NOTE: PREVIOUS PA REQUEST ON 10.19.22. PT MUST HAVE TRIED/FAILED TWO OF THE FOLLOWING, [place], [place]*   PA submitted on 11.8.22 Key [id number] Status is pending   [facility] will continue to follow  Luciano Cutter, CPhT Patient Advocate Phone: [phone] Fax:  [phone]

## 2020-11-26 DIAGNOSIS — F331 Major depressive disorder, recurrent, moderate: Secondary | ICD-10-CM | POA: Diagnosis not present

## 2020-11-26 DIAGNOSIS — F4310 Post-traumatic stress disorder, unspecified: Secondary | ICD-10-CM | POA: Diagnosis not present

## 2020-11-27 NOTE — Telephone Encounter (Signed)
Received a fax regarding Prior Authorization from [employer] for [place]. Authorization has been DENIED because PT HAS NOT TRIED/FAILED TWO FORMULARY PREFERRED DRUGS.

## 2020-11-27 NOTE — Telephone Encounter (Signed)
SG please advise. Thanks

## 2020-11-28 ENCOUNTER — Encounter (HOSPITAL_COMMUNITY): Payer: Self-pay | Admitting: *Deleted

## 2020-11-28 NOTE — Progress Notes (Signed)
Received referral from Dr. [name] for this pt to participate in pulmonary rehab with the diagnosis of Severe COPD. Clinical review of pt follow up appt on 11/7 with Eric Form NP.  Pulmonary office note.  Pt with Covid Risk Score - 1. Pt appropriate for scheduling for Pulmonary rehab.  Will forward to support staff for scheduling and verification of insurance eligibility/benefits with pt consent. Cherre Huger, BSN Cardiac and Training and development officer

## 2020-11-29 MED ORDER — BUDESONIDE-FORMOTEROL FUMARATE 160-4.5 MCG/ACT IN AERO: 2.0000 | INHALATION_SPRAY | Freq: Two times a day (BID) | RESPIRATORY_TRACT | 3 refills | Status: DC

## 2020-12-01 ENCOUNTER — Ambulatory Visit: Payer: Medicaid Other | Attending: Family Medicine | Admitting: Family Medicine

## 2020-12-01 ENCOUNTER — Other Ambulatory Visit: Payer: Self-pay

## 2020-12-01 ENCOUNTER — Encounter: Payer: Self-pay | Admitting: Family Medicine

## 2020-12-01 VITALS — BP 98/65 | HR 63 | Ht 65.0 in | Wt 156.0 lb

## 2020-12-01 DIAGNOSIS — E7800 Pure hypercholesterolemia, unspecified: Secondary | ICD-10-CM

## 2020-12-01 DIAGNOSIS — M5442 Lumbago with sciatica, left side: Secondary | ICD-10-CM

## 2020-12-01 DIAGNOSIS — M5441 Lumbago with sciatica, right side: Secondary | ICD-10-CM

## 2020-12-01 DIAGNOSIS — G44229 Chronic tension-type headache, not intractable: Secondary | ICD-10-CM

## 2020-12-01 DIAGNOSIS — N39498 Other specified urinary incontinence: Secondary | ICD-10-CM

## 2020-12-01 DIAGNOSIS — G8929 Other chronic pain: Secondary | ICD-10-CM

## 2020-12-01 DIAGNOSIS — Z23 Encounter for immunization: Secondary | ICD-10-CM | POA: Diagnosis not present

## 2020-12-01 DIAGNOSIS — R7303 Prediabetes: Secondary | ICD-10-CM | POA: Diagnosis not present

## 2020-12-01 LAB — POCT GLYCOSYLATED HEMOGLOBIN (HGB A1C): HbA1c, POC (controlled diabetic range): 5.9 % (ref 0.0–7.0)

## 2020-12-01 LAB — POCT URINALYSIS DIP (CLINITEK)
Bilirubin, UA: NEGATIVE
Blood, UA: NEGATIVE
Glucose, UA: NEGATIVE mg/dL
Ketones, POC UA: NEGATIVE mg/dL
Leukocytes, UA: NEGATIVE
Nitrite, UA: NEGATIVE
POC PROTEIN,UA: NEGATIVE
Spec Grav, UA: 1.025 (ref 1.010–1.025)
Urobilinogen, UA: 0.2 E.U./dL
pH, UA: 8 (ref 5.0–8.0)

## 2020-12-01 MED ORDER — KETOROLAC TROMETHAMINE 60 MG/2ML IM SOLN
60.0000 mg | Freq: Once | INTRAMUSCULAR | Status: AC
  Administered 2020-12-01: 60 mg via INTRAMUSCULAR

## 2020-12-01 MED ORDER — OXYBUTYNIN CHLORIDE 5 MG PO TABS: ORAL_TABLET | Freq: Two times a day (BID) | ORAL | 1 refills | Status: DC

## 2020-12-01 MED ORDER — ATORVASTATIN CALCIUM 20 MG PO TABS: ORAL_TABLET | Freq: Every day | ORAL | 1 refills | Status: DC

## 2020-12-01 MED ORDER — DULOXETINE HCL 60 MG PO CPEP: ORAL_CAPSULE | Freq: Every day | ORAL | 1 refills | Status: DC

## 2020-12-01 MED ORDER — AMITRIPTYLINE HCL 50 MG PO TABS: ORAL_TABLET | Freq: Every day | ORAL | 1 refills | Status: DC

## 2020-12-01 MED ORDER — CYCLOBENZAPRINE HCL 10 MG PO TABS: ORAL_TABLET | Freq: Two times a day (BID) | ORAL | 1 refills | Status: DC | PRN

## 2020-12-01 NOTE — Progress Notes (Signed)
Subjective:  Patient ID: Cheryl King, female    DOB: 06-30-1965  Age: 55 y.o. MRN: [id number]  CC: Back Pain (Running down to feet)   HPI Cheryl King is a 55 y.o. year old female with a history of Prediabetes (diet controlled A1c 5.9) chronic low back pain with sciatica, GERD, hyperlipidemia, tobacco abuse here for a follow-up visit.  Interval History: Her back continues to hurt and it radiates down to her buttock bilaterally and down both legs.  She now has ambulate with the aid of a cane. She just had a visit with Dr. Louanne Skye of orthopedics on 11/20/2020 and notes reviewed with plans for epidural spinal injection with Dr. Ernestina Patches. She would like a Toradol shot for her back. Previously followed by Pain Management but she no longer wants to go there.  She saw Pulmonary on 11/24/20 and was placed on ICS/LABA after Medicaid denied Trelegy.  She is also working on quitting tobacco.  And is using nicotine patches. Compliant with Lipitor for hyperlipidemia and Zetia was recently added by cardiology. Her prediabetes is diet controlled but A1c is 5.9 up from 5.6 previously. Her urinary incontinence is stable on her current regimen.  Past Medical History:  Diagnosis Date   Acid reflux    Allergy    Carpal tunnel syndrome    COPD (chronic obstructive pulmonary disease) (HCC)    Depression    Diverticulitis    Hyperlipidemia    Low back pain    Neck pain    Sacroiliac inflammation (HCC)    Sciatica    Seasonal allergies     Past Surgical History:  Procedure Laterality Date   ABDOMINAL SURGERY     ANKLE SURGERY     lt.   BREAST EXCISIONAL BIOPSY Right    pt states years ago- not sure when   PARTIAL HYSTERECTOMY     right breast cyst removed      URETHRAL DIVERTICULUM REPAIR     uretrral diverticultis    Family History  Problem Relation Age of Onset   Healthy Mother    Other Father        Unsure of medical history   Asthma Maternal Aunt    Diabetes Maternal Aunt     Cancer Maternal Aunt    Breast cancer Maternal Aunt    Hypertension Maternal Grandmother    Heart Problems Maternal Grandmother    Hypertension Other    COPD Other    Colon cancer Neg Hx    Pancreatic cancer Neg Hx    Rectal cancer Neg Hx    Stomach cancer Neg Hx    Colon polyps Neg Hx    Esophageal cancer Neg Hx     Allergies  Allergen Reactions   Latex Other (See Comments)    Pt unsure of reaction   Percocet [Oxycodone-Acetaminophen] Nausea Only   Vicodin [Hydrocodone-Acetaminophen] Nausea Only   Penicillins Rash    Outpatient Medications Prior to Visit  Medication Sig Dispense Refill   Acetaminophen-Codeine 300-30 MG tablet TAKE 1 TABLET BY MOUTH 2 (TWO) TIMES DAILY AS NEEDED. FOR PAIN 30 tablet 0   albuterol (VENTOLIN HFA) 108 (90 Base) MCG/ACT inhaler Inhale 2 puffs into the lungs every 6 (six) hours as needed for wheezing or shortness of breath. 18 g 5   budesonide-formoterol (SYMBICORT) 160-4.5 MCG/ACT inhaler Inhale 2 puffs into the lungs in the morning and at bedtime. 10.2 g 3   cetirizine (ZYRTEC) 10 MG tablet Take 1 tablet (10 mg total) by mouth  daily. 90 tablet 1   diclofenac sodium (VOLTAREN) 1 % GEL Apply 2 g topically 4 (four) times daily. 5 Tube 2   ezetimibe (ZETIA) 10 MG tablet Take 1 tablet (10 mg total) by mouth daily after supper. 30 tablet 1   fluticasone (FLONASE) 50 MCG/ACT nasal spray PLACE 2 SPRAYS INTO BOTH NOSTRILS DAILY. 16 g 2   Fluticasone-Umeclidin-Vilant (TRELEGY ELLIPTA) 200-62.5-25 MCG/ACT AEPB Inhale 1 puff into the lungs daily. 28 each 0   Lactobacillus CAPS Take 1 capsule by mouth daily.     lidocaine (LIDODERM) 5 % PLACE 1 PATCH ONTO THE SKIN DAILY. REMOVE & DISCARD PATCH WITHIN 12 HOURS OR AS DIRECTED BY MD 30 patch 6   Multiple Vitamin (MULTIVITAMIN WITH MINERALS) TABS tablet Take 1 tablet by mouth daily.     naproxen (NAPROSYN) 500 MG tablet TAKE 1 TABLET BY MOUTH TWO TIMES DAILY WITH MEALS 60 tablet 3   nicotine (NICODERM CQ) 21  mg/24hr patch Place 1 patch (21 mg total) onto the skin daily. 28 patch 0   RESTASIS 0.05 % ophthalmic emulsion   3   triamcinolone cream (KENALOG) 0.1 % Apply 1 application topically 2 (two) times daily. 45 g 1   amitriptyline (ELAVIL) 50 MG tablet TAKE 1 TABLET (50 MG TOTAL) BY MOUTH AT BEDTIME. 90 tablet 1   atorvastatin (LIPITOR) 20 MG tablet TAKE 1 TABLET (20 MG TOTAL) BY MOUTH DAILY. 90 tablet 1   cyclobenzaprine (FLEXERIL) 10 MG tablet TAKE 1 TABLET (10 MG TOTAL) BY MOUTH 2 (TWO) TIMES DAILY AS NEEDED FOR MUSCLE SPASMS. 180 tablet 1   DULoxetine (CYMBALTA) 60 MG capsule TAKE 1 CAPSULE (60 MG TOTAL) BY MOUTH DAILY. 90 capsule 1   oxybutynin (DITROPAN) 5 MG tablet TAKE 1 TABLET (5 MG TOTAL) BY MOUTH 2 (TWO) TIMES DAILY. 180 tablet 1   pregabalin (LYRICA) 75 MG capsule Take 1 capsule (75 mg total) by mouth 2 (two) times daily. 60 capsule 5   Facility-Administered Medications Prior to Visit  Medication Dose Route Frequency Provider Last Rate Last Admin   0.9 %  sodium chloride infusion  500 mL Intravenous Once Nelida Meuse III, MD         ROS Review of Systems  Constitutional:  Negative for activity change, appetite change and fatigue.  HENT:  Negative for congestion, sinus pressure and sore throat.   Eyes:  Negative for visual disturbance.  Respiratory:  Negative for cough, chest tightness, shortness of breath and wheezing.   Cardiovascular:  Negative for chest pain and palpitations.  Gastrointestinal:  Negative for abdominal distention, abdominal pain and constipation.  Endocrine: Negative for polydipsia.  Genitourinary:  Negative for dysuria and frequency.  Musculoskeletal:  Positive for back pain and gait problem. Negative for arthralgias.  Skin:  Negative for rash.  Neurological:  Negative for tremors, light-headedness and numbness.  Hematological:  Does not bruise/bleed easily.  Psychiatric/Behavioral:  Negative for agitation and behavioral problems.    Objective:  BP  98/65 (BP Location: Left Arm, Patient Position: Sitting, Cuff Size: Normal)   Pulse 63   Ht 5\' 5"  (1.651 m)   Wt 156 lb (70.8 kg)   SpO2 97%   BMI 25.96 kg/m   BP/Weight 12/01/2020 11/24/2020 16/01/958  Systolic BP 98 454 098  Diastolic BP 65 68 70  Wt. (Lbs) 156 156.8 158  BMI 25.96 26.09 26.29      Physical Exam Constitutional:      Appearance: She is well-developed.  Cardiovascular:  Rate and Rhythm: Normal rate.     Heart sounds: Normal heart sounds. No murmur heard. Pulmonary:     Effort: Pulmonary effort is normal.     Breath sounds: Normal breath sounds. No wheezing or rales.  Chest:     Chest wall: No tenderness.  Abdominal:     General: Bowel sounds are normal. There is no distension.     Palpations: Abdomen is soft. There is no mass.     Tenderness: There is no abdominal tenderness.  Musculoskeletal:        General: Normal range of motion.     Right lower leg: No edema.     Left lower leg: No edema.     Comments: No tenderness on palpation of lumbar spine Positive straight leg raise bilaterally  Neurological:     Mental Status: She is alert and oriented to person, place, and time.     Gait: Gait abnormal (ambulates with a cane).  Psychiatric:        Mood and Affect: Mood normal.    CMP Latest Ref Rng & Units 01/28/2020 07/04/2019 09/20/2018  Glucose 65 - 99 mg/dL 84 86 92  BUN 6 - 24 mg/dL 22 23 17   Creatinine 0.57 - 1.00 mg/dL 0.79 0.82 0.72  Sodium 134 - 144 mmol/L 138 139 141  Potassium 3.5 - 5.2 mmol/L 4.4 4.9 4.9  Chloride 96 - 106 mmol/L 102 101 102  CO2 20 - 29 mmol/L 23 26 27   Calcium 8.7 - 10.2 mg/dL 9.5 9.8 9.7  Total Protein 6.0 - 8.5 g/dL 6.9 7.3 6.7  Total Bilirubin 0.0 - 1.2 mg/dL 0.4 0.3 0.3  Alkaline Phos 44 - 121 IU/L 106 108 113  AST 0 - 40 IU/L 22 22 28   ALT 0 - 32 IU/L 16 12 27     Lipid Panel     Component Value Date/Time   CHOL 197 09/30/2020 1119   TRIG 80 09/30/2020 1119   HDL 63 09/30/2020 1119   CHOLHDL 3.2 01/28/2020  1012   CHOLHDL 3.2 12/26/2013 1319   VLDL 18 12/26/2013 1319   LDLCALC 120 (H) 09/30/2020 1119    CBC    Component Value Date/Time   WBC 8.6 04/18/2017 1003   WBC 9.3 04/13/2013 1152   RBC 4.74 04/18/2017 1003   RBC 4.94 04/13/2013 1152   HGB 14.9 04/18/2017 1003   HCT 44.7 04/18/2017 1003   PLT 236 04/18/2017 1003   MCV 94 04/18/2017 1003   MCH 31.4 04/18/2017 1003   MCH 32.2 04/13/2013 1152   MCHC 33.3 04/18/2017 1003   MCHC 34.6 04/13/2013 1152   RDW 14.5 04/18/2017 1003   LYMPHSABS 2.6 04/18/2017 1003   MONOABS 0.9 04/13/2013 1152   EOSABS 0.1 04/18/2017 1003   BASOSABS 0.0 04/18/2017 1003    Lab Results  Component Value Date   HGBA1C 5.9 12/01/2020    Assessment & Plan:  1. Bilateral low back pain with bilateral sciatica, unspecified chronicity Uncontrolled Currently on Lyrica, Cymbalta, Flexeril Scheduled for upcoming epidural spinal injection We have discussed possibly referring her for aquatic therapy if her ESI is ineffective Advised to apply heat or ice whichever is tolerated to painful areas. Counseled on evidence of improvement in pain control with regards to yoga, water aerobics, massage, home physical therapy, exercise as tolerated. - POCT URINALYSIS DIP (CLINITEK) - ketorolac (TORADOL) injection 60 mg  2. Prediabetes A1c of 5.9 which is up from 5.6 previously Counseled on diabetic diet to prevent progression to  type 2 diabetes mellitus Given she will be receiving an epidural spinal injection this puts her at risk of progression Will monitor A1c closely and initiate metformin if needed. - LP+Non-HDL Cholesterol - POCT glycosylated hemoglobin (Hb A1C)  3. Chronic tension-type headache, not intractable Stable - amitriptyline (ELAVIL) 50 MG tablet; TAKE 1 TABLET (50 MG TOTAL) BY MOUTH AT BEDTIME.  Dispense: 90 tablet; Refill: 1  4. Pure hypercholesterolemia Currently on Zetia and atorvastatin We will check lipid panel Low-cholesterol diet -  atorvastatin (LIPITOR) 20 MG tablet; TAKE 1 TABLET (20 MG TOTAL) BY MOUTH DAILY.  Dispense: 90 tablet; Refill: 1  5. Chronic midline low back pain with bilateral sciatica See #1 above - cyclobenzaprine (FLEXERIL) 10 MG tablet; TAKE 1 TABLET (10 MG TOTAL) BY MOUTH 2 (TWO) TIMES DAILY AS NEEDED FOR MUSCLE SPASMS.  Dispense: 180 tablet; Refill: 1 - DULoxetine (CYMBALTA) 60 MG capsule; TAKE 1 CAPSULE (60 MG TOTAL) BY MOUTH DAILY.  Dispense: 90 capsule; Refill: 1  6. Other urinary incontinence Controlled - oxybutynin (DITROPAN) 5 MG tablet; TAKE 1 TABLET (5 MG TOTAL) BY MOUTH 2 (TWO) TIMES DAILY.  Dispense: 180 tablet; Refill: 1  7. Need for shingles vaccine - Varicella-zoster vaccine IM    Meds ordered this encounter  Medications   ketorolac (TORADOL) injection 60 mg   amitriptyline (ELAVIL) 50 MG tablet    Sig: TAKE 1 TABLET (50 MG TOTAL) BY MOUTH AT BEDTIME.    Dispense:  90 tablet    Refill:  1   atorvastatin (LIPITOR) 20 MG tablet    Sig: TAKE 1 TABLET (20 MG TOTAL) BY MOUTH DAILY.    Dispense:  90 tablet    Refill:  1   cyclobenzaprine (FLEXERIL) 10 MG tablet    Sig: TAKE 1 TABLET (10 MG TOTAL) BY MOUTH 2 (TWO) TIMES DAILY AS NEEDED FOR MUSCLE SPASMS.    Dispense:  180 tablet    Refill:  1   DULoxetine (CYMBALTA) 60 MG capsule    Sig: TAKE 1 CAPSULE (60 MG TOTAL) BY MOUTH DAILY.    Dispense:  90 capsule    Refill:  1   oxybutynin (DITROPAN) 5 MG tablet    Sig: TAKE 1 TABLET (5 MG TOTAL) BY MOUTH 2 (TWO) TIMES DAILY.    Dispense:  180 tablet    Refill:  1    Follow-up: Return in about 6 months (around 05/31/2021) for Chronic medical conditions.       Charlott Rakes, MD, FAAFP. [facility] [place], [place]   12/01/2020, 12:02 PM

## 2020-12-01 NOTE — Patient Instructions (Signed)

## 2020-12-01 NOTE — Telephone Encounter (Signed)
Per SG:   Please have patient call and see what is covered for triple therapy by his insurance. The options they have given are dual therapy, and patient needs triple.  If he is out of medication please have him pick up samples if any are available. Thanks

## 2020-12-02 ENCOUNTER — Ambulatory Visit: Payer: Medicaid Other | Admitting: Registered"

## 2020-12-02 ENCOUNTER — Other Ambulatory Visit: Payer: Self-pay | Admitting: Cardiology

## 2020-12-02 DIAGNOSIS — E7800 Pure hypercholesterolemia, unspecified: Secondary | ICD-10-CM

## 2020-12-02 LAB — LP+NON-HDL CHOLESTEROL
Cholesterol, Total: 146 mg/dL (ref 100–199)
HDL: 70 mg/dL (ref >39)
LDL Chol Calc (NIH): 63 mg/dL (ref 0–99)
Total Non-HDL-Chol (LDL+VLDL): 76 mg/dL (ref 0–129)
Triglycerides: 65 mg/dL (ref 0–149)
VLDL Cholesterol Cal: 13 mg/dL (ref 5–40)

## 2020-12-02 NOTE — Telephone Encounter (Signed)
Called pt and there was no answer-LMTCB °

## 2020-12-09 ENCOUNTER — Other Ambulatory Visit: Payer: Self-pay | Admitting: Specialist

## 2020-12-13 ENCOUNTER — Encounter: Payer: Self-pay | Admitting: Family Medicine

## 2020-12-15 ENCOUNTER — Other Ambulatory Visit: Payer: Self-pay | Admitting: Family Medicine

## 2020-12-15 DIAGNOSIS — G8929 Other chronic pain: Secondary | ICD-10-CM

## 2020-12-15 MED ORDER — NAPROXEN 500 MG PO TABS: ORAL_TABLET | Freq: Two times a day (BID) | ORAL | 3 refills | Status: DC

## 2020-12-15 MED ORDER — PREGABALIN 75 MG PO CAPS: 75.0000 mg | ORAL_CAPSULE | Freq: Two times a day (BID) | ORAL | 5 refills | Status: DC

## 2020-12-19 ENCOUNTER — Other Ambulatory Visit: Payer: Self-pay | Admitting: Family Medicine

## 2020-12-19 ENCOUNTER — Encounter: Payer: Self-pay | Admitting: Specialist

## 2020-12-19 ENCOUNTER — Encounter: Payer: Self-pay | Admitting: Family Medicine

## 2020-12-19 MED ORDER — PREDNISONE 20 MG PO TABS: 20.0000 mg | ORAL_TABLET | Freq: Every day | ORAL | 0 refills | Status: DC

## 2020-12-25 ENCOUNTER — Ambulatory Visit: Payer: Medicaid Other | Admitting: Specialist

## 2021-01-01 ENCOUNTER — Other Ambulatory Visit: Payer: Self-pay

## 2021-01-01 ENCOUNTER — Ambulatory Visit (INDEPENDENT_AMBULATORY_CARE_PROVIDER_SITE_OTHER): Payer: Medicaid Other | Admitting: Physical Medicine and Rehabilitation

## 2021-01-01 ENCOUNTER — Encounter: Payer: Self-pay | Admitting: Physical Medicine and Rehabilitation

## 2021-01-01 ENCOUNTER — Ambulatory Visit: Payer: Self-pay

## 2021-01-01 VITALS — BP 108/64 | HR 84

## 2021-01-01 DIAGNOSIS — M5416 Radiculopathy, lumbar region: Secondary | ICD-10-CM

## 2021-01-01 MED ORDER — METHYLPREDNISOLONE ACETATE 80 MG/ML IJ SUSP
80.0000 mg | Freq: Once | INTRAMUSCULAR | Status: AC
  Administered 2021-01-01: 80 mg

## 2021-01-01 NOTE — Progress Notes (Signed)
Pt state lower back pain that travels to her buttocks and down to her feet. Pt state walking, standing and laying down makes the pain worse. Pt state she takes over the counter pain meds and uses heat to help ease her pain.  Numeric Pain Rating Scale and Functional Assessment Average Pain 9   In the last MONTH (on 0-10 scale) has pain interfered with the following?  1. General activity like being  able to carry out your everyday physical activities such as walking, climbing stairs, carrying groceries, or moving a chair?  Rating(10)   +Driver, -BT, -Dye Allergies.

## 2021-01-01 NOTE — Patient Instructions (Signed)

## 2021-01-08 ENCOUNTER — Encounter: Payer: Self-pay | Admitting: Specialist

## 2021-01-08 ENCOUNTER — Ambulatory Visit (INDEPENDENT_AMBULATORY_CARE_PROVIDER_SITE_OTHER): Payer: Medicaid Other | Admitting: Specialist

## 2021-01-08 ENCOUNTER — Other Ambulatory Visit: Payer: Self-pay

## 2021-01-08 VITALS — BP 147/84 | HR 70 | Ht 65.0 in | Wt 156.0 lb

## 2021-01-08 DIAGNOSIS — M5136 Other intervertebral disc degeneration, lumbar region: Secondary | ICD-10-CM | POA: Diagnosis not present

## 2021-01-08 DIAGNOSIS — M48062 Spinal stenosis, lumbar region with neurogenic claudication: Secondary | ICD-10-CM

## 2021-01-08 DIAGNOSIS — M4722 Other spondylosis with radiculopathy, cervical region: Secondary | ICD-10-CM | POA: Diagnosis not present

## 2021-01-08 DIAGNOSIS — M5416 Radiculopathy, lumbar region: Secondary | ICD-10-CM

## 2021-01-08 DIAGNOSIS — M7062 Trochanteric bursitis, left hip: Secondary | ICD-10-CM

## 2021-01-08 DIAGNOSIS — M7061 Trochanteric bursitis, right hip: Secondary | ICD-10-CM

## 2021-01-08 MED ORDER — ACETAMINOPHEN-CODEINE #3 300-30 MG PO TABS: 1.0000 | ORAL_TABLET | Freq: Four times a day (QID) | ORAL | 0 refills | Status: AC | PRN

## 2021-01-08 NOTE — Progress Notes (Signed)
Office Visit Note   Patient: Cheryl King           Date of Birth: [birth year]           MRN: [id number] Visit Date: 01/08/2021              Requested by: Charlott Rakes, MD [place],  [place] [id number] PCP: Charlott Rakes, MD   Assessment & Plan: Visit Diagnoses:  1. Degenerative disc disease, lumbar   2. Spinal stenosis of lumbar region with neurogenic claudication   3. Other spondylosis with radiculopathy, cervical region   4. Greater trochanteric bursitis, left   5. Greater trochanteric bursitis, right   6. Radiculopathy, lumbar region   7. Trochanteric bursitis, right hip     Plan: void bending, stooping and avoid lifting weights greater than 10 lbs. Avoid prolong standing and walking. Avoid frequent bending and stooping  No lifting greater than 10 lbs. May use ice or moist heat for pain. Weight loss is of benefit. Handicap license is approved. When you wish to consider further ESI call and we will have Dr. Romona Curls secretary/Assistant call to arrange for epidural steroid injection   Ice to the hips.  Follow-Up Instructions: No follow-ups on file.   Orders:  No orders of the defined types were placed in this encounter.  No orders of the defined types were placed in this encounter.     Procedures: No procedures performed   Clinical Data: No additional findings.   Subjective: Chief Complaint  Patient presents with   Lower Back - Follow-up    55 year old female with history of   Review of Systems  Constitutional: Negative.   HENT: Negative.    Eyes: Negative.   Respiratory: Negative.    Cardiovascular: Negative.   Gastrointestinal: Negative.   Endocrine: Negative.   Genitourinary: Negative.   Musculoskeletal: Negative.   Skin: Negative.   Allergic/Immunologic: Negative.   Neurological: Negative.   Hematological: Negative.   Psychiatric/Behavioral: Negative.      Objective: Vital Signs: BP (!) 147/84 (BP Location:  Left Arm, Patient Position: Sitting)    Pulse 70    Ht 5\' 5"  (1.651 m)    Wt 156 lb (70.8 kg)    BMI 25.96 kg/m   Physical Exam Constitutional:      Appearance: She is well-developed.  HENT:     Head: Normocephalic and atraumatic.  Eyes:     Pupils: Pupils are equal, round, and reactive to light.  Pulmonary:     Effort: Pulmonary effort is normal.     Breath sounds: Normal breath sounds.  Abdominal:     General: Bowel sounds are normal.     Palpations: Abdomen is soft.  Musculoskeletal:     Cervical back: Normal range of motion and neck supple.     Lumbar back: Negative right straight leg raise test and negative left straight leg raise test.  Skin:    General: Skin is warm and dry.  Neurological:     Mental Status: She is alert and oriented to person, place, and time.  Psychiatric:        Behavior: Behavior normal.        Thought Content: Thought content normal.        Judgment: Judgment normal.   Back Exam   Tenderness  The patient is experiencing tenderness in the lumbar.  Range of Motion  Extension:  abnormal  Flexion:  abnormal  Lateral bend right:  abnormal  Lateral bend  left:  abnormal  Rotation right:  abnormal  Rotation left:  abnormal   Muscle Strength  Right Quadriceps:  5/5  Left Quadriceps:  5/5  Right Hamstrings:  5/5  Left Hamstrings:  5/5   Tests  Straight leg raise right: negative Straight leg raise left: negative  Reflexes  Patellar:  abnormal Achilles:  abnormal Biceps:  abnormal  Other  Toe walk: normal Heel walk: normal    Specialty Comments:  No specialty comments available.  Imaging: No results found.   PMFS History: Patient Active Problem List   Diagnosis Date Noted   Centrilobular emphysema ([place]) 07/29/2020   Multiple nodules of lung 07/29/2020   Hyperlipidemia 07/18/2017   Ulnar neuropathy 04/18/2017   GERD (gastroesophageal reflux disease) 10/19/2016   Prediabetes 05/03/2016   Cubital tunnel syndrome on  right 03/01/2016   Numbness and tingling of right arm 01/01/2016   Chlamydia infection 01/27/2015   Tobacco abuse 12/26/2013   Chronic lower back pain 04/13/2013   Sciatica 04/13/2013   Chronic neck pain 04/13/2013   Spondylosis of cervical spine 04/13/2013   Past Medical History:  Diagnosis Date   Acid reflux    Allergy    Carpal tunnel syndrome    COPD (chronic obstructive pulmonary disease) (HCC)    Depression    Diverticulitis    Hyperlipidemia    Low back pain    Neck pain    Sacroiliac inflammation ([place])    Sciatica    Seasonal allergies     Family History  Problem Relation Age of Onset   Healthy Mother    Other Father        Unsure of medical history   Asthma Maternal Aunt    Diabetes Maternal Aunt    Cancer Maternal Aunt    Breast cancer Maternal Aunt    Hypertension Maternal Grandmother    Heart Problems Maternal Grandmother    Hypertension Other    COPD Other    Colon cancer Neg Hx    Pancreatic cancer Neg Hx    Rectal cancer Neg Hx    Stomach cancer Neg Hx    Colon polyps Neg Hx    Esophageal cancer Neg Hx     Past Surgical History:  Procedure Laterality Date   ABDOMINAL SURGERY     ANKLE SURGERY     lt.   BREAST EXCISIONAL BIOPSY Right    pt states years ago- not sure when   PARTIAL HYSTERECTOMY     right breast cyst removed      URETHRAL DIVERTICULUM REPAIR     uretrral diverticultis   Social History   Occupational History   Occupation: Unemployed  Tobacco Use   Smoking status: Some Days    Packs/day: 0.50    Years: 35.00    Pack years: 17.50    Types: Cigarettes    Start date: 1981   Smokeless tobacco: Never   Tobacco comments:    Wears patcches, 5 cigarettes smoked on some days. ARJ 11/24/20  Vaping Use   Vaping Use: Never used  Substance and Sexual Activity   Alcohol use: Yes    Comment: Social only - seldom   Drug use: No   Sexual activity: Never

## 2021-01-08 NOTE — Patient Instructions (Signed)
Plan: void bending, stooping and avoid lifting weights greater than 10 lbs. Avoid prolong standing and walking. Avoid frequent bending and stooping  No lifting greater than 10 lbs. May use ice or moist heat for pain. Weight loss is of benefit. Handicap license is approved. When you wish to consider further ESI call and we will have Dr. Romona Curls secretary/Assistant call to arrange for epidural steroid injection   Ice to the hips.

## 2021-01-14 ENCOUNTER — Ambulatory Visit: Payer: Medicaid Other | Admitting: Registered"

## 2021-01-20 ENCOUNTER — Telehealth (HOSPITAL_COMMUNITY): Payer: Self-pay | Admitting: *Deleted

## 2021-01-21 DIAGNOSIS — K5229 Other allergic and dietetic gastroenteritis and colitis: Secondary | ICD-10-CM | POA: Diagnosis not present

## 2021-01-23 ENCOUNTER — Telehealth (HOSPITAL_COMMUNITY): Payer: Self-pay

## 2021-01-23 NOTE — Telephone Encounter (Signed)
Pt insurance is active and benefits verified through [facility] Co-pay $3, DED 0/0 met, out of pocket 0/0 met, co-insurance 0%. no pre-authorization required, Shawna/BCBS 01/22/2021@3 :33pm, REF# [id number]

## 2021-01-26 ENCOUNTER — Other Ambulatory Visit: Payer: Self-pay

## 2021-01-26 ENCOUNTER — Encounter: Payer: Self-pay | Admitting: Acute Care

## 2021-01-26 ENCOUNTER — Ambulatory Visit: Payer: Medicaid Other | Admitting: Acute Care

## 2021-01-26 VITALS — BP 132/82 | HR 71 | Temp 98.2°F | Ht 65.0 in | Wt 162.2 lb

## 2021-01-26 DIAGNOSIS — J209 Acute bronchitis, unspecified: Secondary | ICD-10-CM | POA: Diagnosis not present

## 2021-01-26 DIAGNOSIS — J440 Chronic obstructive pulmonary disease with acute lower respiratory infection: Secondary | ICD-10-CM | POA: Diagnosis not present

## 2021-01-26 DIAGNOSIS — F17210 Nicotine dependence, cigarettes, uncomplicated: Secondary | ICD-10-CM

## 2021-01-26 DIAGNOSIS — Z720 Tobacco use: Secondary | ICD-10-CM

## 2021-01-26 MED ORDER — SPIRIVA RESPIMAT 2.5 MCG/ACT IN AERS: 2.0000 | INHALATION_SPRAY | Freq: Every day | RESPIRATORY_TRACT | 12 refills | Status: DC

## 2021-01-26 NOTE — Progress Notes (Signed)
History of Present Illness Cheryl King is a 56 y.o. female current every day smoker  seen 07/29/2020 by Dr. [name] for dyspnea on exertion/ COPD.   Synopsis: Smokes 1/2 PPD for 40 years. States she has had worsening dyspnea with exertion recently. She does have cats and a dog. PFT's 09/2020 show severe COPD.   She is wearing 21 mg nicotine patches , but she is still smoking up to 2 cigarettes a week. She is working hard to quit.     01/26/2021 Pt. Presents for follow up. She continues to smoke, but only about 2 cigarettes a week. . She had a therapeutic trial of Trelegy 11/24/2020 to see if she was better controlled as she was over using her rescue. We also referred her to Pulmonary Rehab. She states her breathing was better on the Trelegy. This medication is not covered by Medicaid at present . We will try Symbicort and Spioriva for triple therapy once she completes her Trelegy samples.These are both covered.  She starts Pulmonary Rehab next week. She denies any cough or discolored secretions. Very rare wheezing and she uses her rescue about 2 times a day, which is significantly better than last month. She benefits from triple therapy.  Test Results:  CBC Latest Ref Rng & Units 04/18/2017 05/03/2016 04/13/2013  WBC 3.4 - 10.8 x10E3/uL 8.6 7.6 9.3  Hemoglobin 11.1 - 15.9 g/dL 14.9 14.9 15.9(H)  Hematocrit 34.0 - 46.6 % 44.7 43.8 46.0  Platelets 150 - 379 x10E3/uL 236 246 226    BMP Latest Ref Rng & Units 01/28/2020 07/04/2019 09/20/2018  Glucose 65 - 99 mg/dL 84 86 92  BUN 6 - 24 mg/dL 22 23 17   Creatinine 0.57 - 1.00 mg/dL 0.79 0.82 0.72  BUN/Creat Ratio 9 - 23 28(H) 28(H) 24(H)  Sodium 134 - 144 mmol/L 138 139 141  Potassium 3.5 - 5.2 mmol/L 4.4 4.9 4.9  Chloride 96 - 106 mmol/L 102 101 102  CO2 20 - 29 mmol/L 23 26 27   Calcium 8.7 - 10.2 mg/dL 9.5 9.8 9.7    BNP No results found for: BNP  ProBNP No results found for: PROBNP  PFT    Component Value Date/Time   FEV1PRE 1.01  09/29/2020 0953   FEV1POST 1.14 09/29/2020 0953   FVCPRE 1.93 09/29/2020 0953   FVCPOST 2.10 09/29/2020 0953   TLC 5.32 09/29/2020 0953   DLCOUNC 7.02 09/29/2020 0953   PREFEV1FVCRT 52 09/29/2020 0953   PSTFEV1FVCRT 54 09/29/2020 0953    XR C-ARM NO REPORT  Result Date: 01/01/2021 Please see Notes tab for imaging impression.    Past medical hx Past Medical History:  Diagnosis Date   Acid reflux    Allergy    Carpal tunnel syndrome    COPD (chronic obstructive pulmonary disease) (HCC)    Depression    Diverticulitis    Hyperlipidemia    Low back pain    Neck pain    Sacroiliac inflammation (HCC)    Sciatica    Seasonal allergies      Social History   Tobacco Use   Smoking status: Some Days    Packs/day: 0.50    Years: 35.00    Pack years: 17.50    Types: Cigarettes    Start date: 32   Smokeless tobacco: Never   Tobacco comments:    Wears patcches, 5 cigarettes smoked on some days. ARJ 11/24/20  Vaping Use   Vaping Use: Never used  Substance Use Topics   Alcohol use:  Yes    Comment: Social only - seldom   Drug use: No    Ms.Luckow reports that she has been smoking cigarettes. She started smoking about 42 years ago. She has a 17.50 pack-year smoking history. She has never used smokeless tobacco. She reports current alcohol use. She reports that she does not use drugs.  Tobacco Cessation: Smokes about 2 cigarettes a week. She is working hard on this.   Past surgical hx, Family hx, Social hx all reviewed.  Current Outpatient Medications on File Prior to Visit  Medication Sig   Acetaminophen-Codeine 300-30 MG tablet TAKE 1 TABLET BY MOUTH 2 (TWO) TIMES DAILY AS NEEDED. FOR PAIN   albuterol (VENTOLIN HFA) 108 (90 Base) MCG/ACT inhaler Inhale 2 puffs into the lungs every 6 (six) hours as needed for wheezing or shortness of breath.   amitriptyline (ELAVIL) 50 MG tablet TAKE 1 TABLET (50 MG TOTAL) BY MOUTH AT BEDTIME.   atorvastatin (LIPITOR) 20 MG tablet  TAKE 1 TABLET (20 MG TOTAL) BY MOUTH DAILY.   Azelastine HCl 137 MCG/SPRAY SOLN Place into both nostrils.   budesonide-formoterol (SYMBICORT) 160-4.5 MCG/ACT inhaler Inhale 2 puffs into the lungs in the morning and at bedtime.   cetirizine (ZYRTEC) 10 MG tablet Take 1 tablet (10 mg total) by mouth daily.   cyclobenzaprine (FLEXERIL) 10 MG tablet TAKE 1 TABLET (10 MG TOTAL) BY MOUTH 2 (TWO) TIMES DAILY AS NEEDED FOR MUSCLE SPASMS.   diclofenac sodium (VOLTAREN) 1 % GEL Apply 2 g topically 4 (four) times daily.   DULoxetine (CYMBALTA) 60 MG capsule TAKE 1 CAPSULE (60 MG TOTAL) BY MOUTH DAILY.   ezetimibe (ZETIA) 10 MG tablet TAKE 1 TABLET (10 MG TOTAL) BY MOUTH DAILY AFTER SUPPER.   fluticasone (FLONASE) 50 MCG/ACT nasal spray PLACE 2 SPRAYS INTO BOTH NOSTRILS DAILY.   Lactobacillus CAPS Take 1 capsule by mouth daily.   lidocaine (LIDODERM) 5 % PLACE 1 PATCH ONTO THE SKIN DAILY. REMOVE & DISCARD PATCH WITHIN 12 HOURS OR AS DIRECTED BY MD   montelukast (SINGULAIR) 10 MG tablet Take 10 mg by mouth daily.   Multiple Vitamin (MULTIVITAMIN WITH MINERALS) TABS tablet Take 1 tablet by mouth daily.   naproxen (NAPROSYN) 500 MG tablet TAKE 1 TABLET BY MOUTH TWO TIMES DAILY WITH MEALS   nicotine (NICODERM CQ) 21 mg/24hr patch Place 1 patch (21 mg total) onto the skin daily.   oxybutynin (DITROPAN) 5 MG tablet TAKE 1 TABLET (5 MG TOTAL) BY MOUTH 2 (TWO) TIMES DAILY.   predniSONE (DELTASONE) 20 MG tablet Take 1 tablet (20 mg total) by mouth daily with breakfast.   pregabalin (LYRICA) 75 MG capsule Take 1 capsule (75 mg total) by mouth 2 (two) times daily.   RESTASIS 0.05 % ophthalmic emulsion    triamcinolone cream (KENALOG) 0.1 % Apply 1 application topically 2 (two) times daily.   Current Facility-Administered Medications on File Prior to Visit  Medication   0.9 %  sodium chloride infusion   methylPREDNISolone acetate (DEPO-MEDROL) injection 80 mg     Allergies  Allergen Reactions   Latex Other (See  Comments)    Pt unsure of reaction   Percocet [Oxycodone-Acetaminophen] Nausea Only   Vicodin [Hydrocodone-Acetaminophen] Nausea Only   Penicillins Rash    Review Of Systems:  Constitutional:   No  weight loss, night sweats,  Fevers, chills, fatigue, or  lassitude.  HEENT:   No headaches,  Difficulty swallowing,  Tooth/dental problems, or  Sore throat,  No sneezing, itching, ear ache, nasal congestion, post nasal drip,   CV:  No chest pain,  Orthopnea, PND, swelling in lower extremities, anasarca, dizziness, palpitations, syncope.   GI  No heartburn, indigestion, abdominal pain, nausea, vomiting, diarrhea, change in bowel habits, loss of appetite, bloody stools.   Resp: No shortness of breath with exertion or at rest.  No excess mucus, no productive cough,  No non-productive cough,  No coughing up of blood.  No change in color of mucus.  No wheezing.  No chest wall deformity  Skin: no rash or lesions.  GU: no dysuria, change in color of urine, no urgency or frequency.  No flank pain, no hematuria   MS:  No joint pain or swelling.  No decreased range of motion.  No back pain.  Psych:  No change in mood or affect. No depression or anxiety.  No memory loss.   Vital Signs BP 132/82 (BP Location: Left Arm, Cuff Size: Normal)    Pulse 71    Temp 98.2 F (36.8 C)    Ht 5\' 5"  (1.651 m)    Wt 162 lb 3.2 oz (73.6 kg)    SpO2 96%    BMI 26.99 kg/m    Physical Exam:  General- No distress,  A&Ox3, pleasant ENT: No sinus tenderness, TM clear, pale nasal mucosa, no oral exudate,no post nasal drip, no LAN Cardiac: S1, S2, regular rate and rhythm, no murmur Chest: No wheeze/ rales/ dullness; no accessory muscle use, no nasal flaring, no sternal retractions Abd.: Soft Non-tender, ND, BS +, Body mass index is 26.99 kg/m.  Ext: No clubbing cyanosis, edema Neuro:  normal strength, MAE x 4, A&O x 3 Skin: No rashes, warm and dry, NO lesions  Psych: normal mood and  behavior   Assessment/Plan  COPD, Severe per PFT's Still smoking Plan It is good to see you today. Please use Trelegy samples until they are gone.  One puff once daily of the Trelegy, rinse after use.  After Trelegy is gone, use Symbicort and Spiriva. Symbicort is 2 puffs twice daily, Spiriva is 2 puffs once daily. Rinse mouth after use.  In will send in an order for Spiriva as you already have Symbicort.  I am encouraged that you start Pulmonary rehab soon. I think this will really help you.  Continue Zyrtec for allergies.  Follow up lung cancer screening 09/2021>> ordered Note your daily symptoms > remember "red flags" for COPD:  Increase in cough, increase in sputum production, increase in shortness of breath or activity intolerance. If you notice these symptoms, please call to be seen.    Please contact office for sooner follow up if symptoms do not improve or worsen or seek emergency care    Tobacco Abuse Plan Continue using 21 mg nicotine patches.  Work on getting to zero cigarettes a week.  This will improve your breathing and decrease your risk of poor outcomes caused by smoking Remember 1-800 QUIT NOW for free nicotine patches , gum or mints.   I spent 35 minutes dedicated to the care of this patient on the date of this encounter to include pre-visit review of records, face-to-face time with the patient discussing conditions above, post visit ordering of testing, clinical documentation with the electronic health record, making appropriate referrals as documented, and communicating necessary information to the patient's healthcare team.   Magdalen Spatz, NP 01/26/2021  9:21 AM

## 2021-01-26 NOTE — Patient Instructions (Addendum)
It is good to see you today. Please use Trelegy samples until they are gone.  One puff once daily of the Trelegy, rinse after use.  After Trelegy is gone, use Symbicort and Spiriva. Symbicort is 2 puffs twice daily, Spiriva is 2 puffs once daily. Rinse mouth after use.  In will send in an order for Spiriva as you already have Symbicort.  I am encouraged that you start Pulmonary rehab soon. I think this will really help you.  Continue Zyrtec for allergies.  Follow up lung cancer screening 09/2021>> ordered Note your daily symptoms > remember "red flags" for COPD:  Increase in cough, increase in sputum production, increase in shortness of breath or activity intolerance. If you notice these symptoms, please call to be seen.     Follow up in 2 months with Dr. [name]. Please contact office for sooner follow up if symptoms do not improve or worsen or seek emergency care

## 2021-01-27 ENCOUNTER — Other Ambulatory Visit: Payer: Self-pay | Admitting: Family Medicine

## 2021-01-27 ENCOUNTER — Telehealth (HOSPITAL_COMMUNITY): Payer: Self-pay | Admitting: *Deleted

## 2021-01-27 DIAGNOSIS — R0982 Postnasal drip: Secondary | ICD-10-CM

## 2021-01-27 MED ORDER — FLUTICASONE PROPIONATE 50 MCG/ACT NA SUSP: 2.0000 | Freq: Every day | NASAL | 0 refills | Status: DC

## 2021-01-27 NOTE — Telephone Encounter (Signed)
Requested Prescriptions  Pending Prescriptions Disp Refills   fluticasone (FLONASE) 50 MCG/ACT nasal spray 16 g 2    Sig: PLACE 2 SPRAYS INTO BOTH NOSTRILS DAILY.     Ear, Nose, and Throat: Nasal Preparations - Corticosteroids Passed - 01/27/2021  8:45 AM      Passed - Valid encounter within last 12 months    Recent Outpatient Visits          1 month ago Bilateral low back pain with bilateral sciatica, unspecified chronicity   [place], Ward, MD   5 months ago Chronic tension-type headache, not intractable   [place], [place], MD   8 months ago Current mild episode of major depressive disorder without prior episode [facility])   [place], [place], MD   1 year ago Tobacco abuse   [place], Charlane Ferretti, MD   1 year ago Chronic midline low back pain with bilateral sciatica   [place], Enobong, MD      Future Appointments            In 6 days Daisy Blossom, Jarome Matin, [place]   In 2 months [name], Leanna Sato, MD [facility]   In 2 months Louanne Skye, Daleen Bo, MD [place]   In 4 months Charlott Rakes, MD [place]

## 2021-01-28 ENCOUNTER — Encounter (HOSPITAL_COMMUNITY)
Admission: RE | Admit: 2021-01-28 | Discharge: 2021-01-28 | Disposition: A | Discharge status: Home / Self Care | Payer: Medicaid Other | Source: Ambulatory Visit | Attending: Pulmonary Disease | Admitting: Pulmonary Disease

## 2021-01-28 ENCOUNTER — Other Ambulatory Visit: Payer: Self-pay

## 2021-01-28 ENCOUNTER — Encounter (HOSPITAL_COMMUNITY): Payer: Self-pay

## 2021-01-28 ENCOUNTER — Telehealth (HOSPITAL_COMMUNITY): Payer: Self-pay | Admitting: Licensed Clinical Social Worker

## 2021-01-28 VITALS — BP 120/60 | Ht 64.5 in | Wt 164.0 lb

## 2021-01-28 DIAGNOSIS — J449 Chronic obstructive pulmonary disease, unspecified: Secondary | ICD-10-CM | POA: Diagnosis not present

## 2021-01-28 NOTE — Progress Notes (Signed)
Cheryl King 56 y.o. female  Initial Psychosocial Assessment  Pt psychosocial assessment reveals pt lives alone. . Pt hobbies include dancing, braiding hair and would like to play tennis if she was less short of breath. Pt reports her stress level is high. Areas of stress/anxiety include Family Finances, being unable to get disability and her fear of being homeless.  Pt does exhibits signs of depression. Signs of depression include anxiety, helplessness, hopelessness,crying episodes and difficulty maintaining sleep. Pt shows fair  coping skills with negative outlook . Although she is looking forward in participating in the pulmonary rehab program and has made this a priority to help her improve her lung condition.Also had a hospital social worker come to visit pt while he was here to see if she could provide her any further assistance with her living situation and disability.Offered emotional support and reassurance. Monitor and evaluate progress toward psychosocial goal(s).  Goal(s): Improved management of stress,depression, anxiety Improved coping skills Help patient work toward returning to meaningful activities that improve patient's QOL and are attainable with patient's lung disease   01/28/2021 1:31 PM

## 2021-01-28 NOTE — Progress Notes (Signed)
Cheryl King 56 y.o. female Pulmonary Rehab Orientation Note This patient who was referred to Pulmonary Rehab by Dr. [name] with the diagnosis of COPD severe arrived today in Cardiac and Pulmonary Rehab. She arrived ambulatory with normal gait.  Color good, skin warm and dry. Patient is oriented to time and place. Patient's medical history, psychosocial health, and medications reviewed. Psychosocial assessment reveals pt lives with alone. Cheryl King is currently unemployed and has been turned down for disability. Pt hobbies include  dancing, braiding hair and would like to play tennis if she was not so short of breath . Pt reports her stress level is high. Areas of stress/anxiety include family , finances, and fear of being homeless . Pt does exhibit signs of depression. Signs of depression include anxiety, crying spells, helplessness, hopelessness, panic, and sadness and difficulty maintaining sleep. PHQ2/9 score 5/10. Cheryl King shows fair  coping skills with  a negative  outlook on life due to her financial strains and possible homeless soon.Had a hospital social worker visit her today and see if she would be able to offer her any assistance with her current living situation and disability.Offered emotional support and reassurance. Will continue to monitor and evaluate progress toward psychosocial goal of her being able to participate in pulmonary rehab with her being to handle her stress in a healthy way. She is currently smoking some days she smokes none and on stressful days she is smoking 4-5 cigarettes per day. She is using nicotine patches. I gave her tips for quitting and encouraged her to call the 1-800 quit line.Physical assessment reveals heart rate is normal, breath sounds clear to auscultation, no wheezes, rales, or rhonchi. Grip strength equal, strong. Distal pulses present. Cheryl King reports she does take medications as prescribed. Patient states she follows a Regular diet. The patient reports no  specific efforts to gain or lose weight.. Pt's weight will be monitored closely. Demonstration and practice of PLB using pulse oximeter. Cheryl King able to return demonstration satisfactorily. Safety and hand hygiene in the exercise area reviewed with patient. Cheryl King voices understanding of the information reviewed. Department expectations discussed with patient and achievable goals were set. The patient shows enthusiasm about attending the program and we look forward to working with Cheryl King. Cheryl King completed a 6 min walk test today and is scheduled to begin exercise on 02/03/2021.   Dr. Rodman Pickle is Medical Director for Pulmonary Rehab at [place]

## 2021-01-28 NOTE — Telephone Encounter (Signed)
CSW consulted to meet with pt during Pulmonary Rehab orientation regarding current housing concerns.  Pt living alone but in a house owned by her Barbaraann Rondo and Geophysicist/field seismologist.  Reports he Grandmother has dementia so her Barbaraann Rondo is the one making decisions regarding the property.  The pt has lived there since 2008 but states that her uncle is now requesting she leave since she doesn't pay any rent.  Pt reports she has applied for disability but was denied- is now in the appeals process.  CSW looked at pt SSA account to try and determine what is going on with this.  Confirmed she was denied and is still awaiting determination if she meets criteria for SSI.  Unsure why she would be denied as she was approved for Medicaid and her [place] on [place] states she is "disabled individual eligible for but not receiving SSI"  Reports she has case worker Mervin Hack who is handling her case at [place] ext [phone].  Has left him several messages with no return call and has spoken to other rep over the phone without any return calls.  CSW left message for case worker as well to inquire what is going on and how we could assist with this process.  Pt is aware of assistance programs to help with rent/utilities but states that her uncle is not willing to fill those out- would have to be under his name since these things are registered under his name.  Pt has considered shelter programs and is aware of the local options but is worried what she would do with her belongings as she doesn't have money for storage unit at this time- unfortunately no assistance available for this.  She has been on the section 8 waitlist for 3 years- still no word on being approved.  CSW referred her to [employer] to complete application for [employer] program as she is 40 and older.  CSW will investigate if there are any other options available to help this pt and reach out to update patient  Jorge Ny,  [facility] Desk#: [phone] Cell#: [phone]

## 2021-01-28 NOTE — Progress Notes (Signed)
Pulmonary Individual Treatment Plan  Patient Details  Name: Cheryl King MRN: [id number] Date of Birth: 17-Feb-1965 Referring Provider:   [name] Pulmonary Rehab Walk Test from 01/28/2021 in [place]  Referring Provider [name]       Initial Encounter Date:  Flowsheet Row Pulmonary Rehab Walk Test from 01/28/2021 in [place]  Date 01/28/21       Visit Diagnosis: COPD, severe ([place])  Patient's Home Medications on Admission:   Current Outpatient Medications:    Acetaminophen-Codeine 300-30 MG tablet, TAKE 1 TABLET BY MOUTH 2 (TWO) TIMES DAILY AS NEEDED. FOR PAIN, Disp: 30 tablet, Rfl: 0   albuterol (VENTOLIN HFA) 108 (90 Base) MCG/ACT inhaler, Inhale 2 puffs into the lungs every 6 (six) hours as needed for wheezing or shortness of breath., Disp: 18 g, Rfl: 5   amitriptyline (ELAVIL) 50 MG tablet, TAKE 1 TABLET (50 MG TOTAL) BY MOUTH AT BEDTIME., Disp: 90 tablet, Rfl: 1   atorvastatin (LIPITOR) 20 MG tablet, TAKE 1 TABLET (20 MG TOTAL) BY MOUTH DAILY., Disp: 90 tablet, Rfl: 1   budesonide-formoterol (SYMBICORT) 160-4.5 MCG/ACT inhaler, Inhale 2 puffs into the lungs in the morning and at bedtime., Disp: 10.2 g, Rfl: 3   cetirizine (ZYRTEC) 10 MG tablet, Take 1 tablet (10 mg total) by mouth daily., Disp: 90 tablet, Rfl: 1   cyclobenzaprine (FLEXERIL) 10 MG tablet, TAKE 1 TABLET (10 MG TOTAL) BY MOUTH 2 (TWO) TIMES DAILY AS NEEDED FOR MUSCLE SPASMS., Disp: 180 tablet, Rfl: 1   diclofenac sodium (VOLTAREN) 1 % GEL, Apply 2 g topically 4 (four) times daily., Disp: 5 Tube, Rfl: 2   DULoxetine (CYMBALTA) 60 MG capsule, TAKE 1 CAPSULE (60 MG TOTAL) BY MOUTH DAILY., Disp: 90 capsule, Rfl: 1   ezetimibe (ZETIA) 10 MG tablet, TAKE 1 TABLET (10 MG TOTAL) BY MOUTH DAILY AFTER SUPPER., Disp: 90 tablet, Rfl: 1   fluticasone (FLONASE) 50 MCG/ACT nasal spray, PLACE 2 SPRAYS INTO BOTH NOSTRILS DAILY., Disp: 16 g, Rfl: 0    Lactobacillus CAPS, Take 1 capsule by mouth daily., Disp: , Rfl:    montelukast (SINGULAIR) 10 MG tablet, Take 10 mg by mouth daily., Disp: , Rfl:    Multiple Vitamin (MULTIVITAMIN WITH MINERALS) TABS tablet, Take 1 tablet by mouth daily., Disp: , Rfl:    naproxen (NAPROSYN) 500 MG tablet, TAKE 1 TABLET BY MOUTH TWO TIMES DAILY WITH MEALS, Disp: 60 tablet, Rfl: 3   nicotine (NICODERM CQ) 21 mg/24hr patch, Place 1 patch (21 mg total) onto the skin daily., Disp: 28 patch, Rfl: 0   oxybutynin (DITROPAN) 5 MG tablet, TAKE 1 TABLET (5 MG TOTAL) BY MOUTH 2 (TWO) TIMES DAILY., Disp: 180 tablet, Rfl: 1   pregabalin (LYRICA) 75 MG capsule, Take 1 capsule (75 mg total) by mouth 2 (two) times daily., Disp: 60 capsule, Rfl: 5   RESTASIS 0.05 % ophthalmic emulsion, , Disp: , Rfl: 3   Tiotropium Bromide Monohydrate (SPIRIVA RESPIMAT) 2.5 MCG/ACT AERS, Inhale 2 puffs into the lungs daily., Disp: 1 each, Rfl: 12   triamcinolone cream (KENALOG) 0.1 %, Apply 1 application topically 2 (two) times daily., Disp: 45 g, Rfl: 1   Azelastine HCl 137 MCG/SPRAY SOLN, Place into both nostrils., Disp: , Rfl:    predniSONE (DELTASONE) 20 MG tablet, Take 1 tablet (20 mg total) by mouth daily with breakfast. (Patient not taking: Reported on 01/28/2021), Disp: 5 tablet, Rfl: 0  Current Facility-Administered Medications:    0.9 %  sodium chloride infusion, 500 mL, Intravenous, Once, Danis, Estill Cotta III, MD   methylPREDNISolone acetate (DEPO-MEDROL) injection 80 mg, 80 mg, Other, Once, Magnus Sinning, MD  Past Medical History: Past Medical History:  Diagnosis Date   Acid reflux    Allergy    Carpal tunnel syndrome    COPD (chronic obstructive pulmonary disease) (HCC)    Depression    Diverticulitis    Hyperlipidemia    Low back pain    Neck pain    Sacroiliac inflammation (HCC)    Sciatica    Seasonal allergies     Tobacco Use: Social History   Tobacco Use  Smoking Status Some Days   Packs/day: 0.25   Years:  35.00   Pack years: 8.75   Types: Cigarettes   Start date: 1981  Smokeless Tobacco Never  Tobacco Comments   Wears patches, 5 cigarettes smoked on some days especially the days that her uncle stress her with leaving the home.Some days smokes none when her uncle is out of town and not in the home.    Labs: Recent Review Flowsheet Data     Labs for ITP Cardiac and Pulmonary Rehab Latest Ref Rng & Units 09/20/2018 07/04/2019 01/28/2020 09/30/2020 12/01/2020   Cholestrol 100 - 199 mg/dL 202(H) 192 191 197 146   LDLCALC 0 - 99 mg/dL 120(H) 116(H) 117(H) 120(H) 63   HDL >39 mg/dL 67 64 60 63 70   Trlycerides 0 - 149 mg/dL 83 65 74 80 65   Hemoglobin A1c 0.0 - 7.0 % 5.6 5.6(A) - - 5.9       Capillary Blood Glucose: Lab Results  Component Value Date   GLUCAP 79 11/10/2011     Pulmonary Assessment Scores:  Pulmonary Assessment Scores     Row Name 01/28/21 1205         ADL UCSD   SOB Score total 48       CAT Score   CAT Score 26       mMRC Score   mMRC Score 4             UCSD: Self-administered rating of dyspnea associated with activities of daily living (ADLs) 6-point scale (0 = "not at all" to 5 = "maximal or unable to do because of breathlessness")  Scoring Scores range from 0 to 120.  Minimally important difference is 5 units  CAT: CAT can identify the health impairment of COPD patients and is better correlated with disease progression.  CAT has a scoring range of zero to 40. The CAT score is classified into four groups of low (less than 10), medium (10 - 20), high (21-30) and very high (31-40) based on the impact level of disease on health status. A CAT score over 10 suggests significant symptoms.  A worsening CAT score could be explained by an exacerbation, poor medication adherence, poor inhaler technique, or progression of COPD or comorbid conditions.  CAT MCID is 2 points  mMRC: mMRC (Modified Medical Research Council) Dyspnea Scale is used to assess the degree  of baseline functional disability in patients of respiratory disease due to dyspnea. No minimal important difference is established. A decrease in score of 1 point or greater is considered a positive change.   Pulmonary Function Assessment:  Pulmonary Function Assessment - 01/28/21 1057       Breath   Bilateral Breath Sounds Clear    Shortness of Breath Fear of Shortness of Breath;Limiting activity;Panic with Shortness of Breath  Exercise Target Goals: Exercise Program Goal: Individual exercise prescription set using results from initial 6 min walk test and THRR while considering  patients activity barriers and safety.   Exercise Prescription Goal: Initial exercise prescription builds to 30-45 minutes a day of aerobic activity, 2-3 days per week.  Home exercise guidelines will be given to patient during program as part of exercise prescription that the participant will acknowledge.  Activity Barriers & Risk Stratification:  Activity Barriers & Cardiac Risk Stratification - 01/28/21 1054       Activity Barriers & Cardiac Risk Stratification   Activity Barriers Deconditioning;Back Problems;Shortness of Breath;Neck/Spine Problems    Cardiac Risk Stratification Low             6 Minute Walk:  6 Minute Walk     Row Name 01/28/21 1211         6 Minute Walk   Phase Initial     Distance 1028 feet     Walk Time 6 minutes     # of Rest Breaks 2  1:30-2:40, 5:40-6:00. Due to desat     MPH 1.95     METS 2.98     RPE 11     Perceived Dyspnea  3     VO2 Peak 10.42     Symptoms No     Resting HR 73 bpm     Resting BP 120/60     Resting Oxygen Saturation  94 %     Exercise Oxygen Saturation  during 6 min walk 84 %     Max Ex. HR 98 bpm     Max Ex. BP 120/60     2 Minute Post BP 116/60       Interval HR   1 Minute HR 85     2 Minute HR 88  1:19- 84     3 Minute HR 91     4 Minute HR 92     5 Minute HR 91  5:40- 98     6 Minute HR 90     2 Minute Post  HR 67     Interval Heart Rate? Yes       Interval Oxygen   Interval Oxygen? Yes     Baseline Oxygen Saturation % 94 %     1 Minute Oxygen Saturation % 85 %     1 Minute Liters of Oxygen 0 L     2 Minute Oxygen Saturation % 84 %  84% @ 1:19     2 Minute Liters of Oxygen 0 L  Increased to 1L     3 Minute Oxygen Saturation % 88 %     3 Minute Liters of Oxygen 1 L     4 Minute Oxygen Saturation % 88 %     4 Minute Liters of Oxygen 1 L     5 Minute Oxygen Saturation % 86 %     5 Minute Liters of Oxygen 1 L     6 Minute Oxygen Saturation % 86 %  84% @ 5:40     6 Minute Liters of Oxygen 2 L  Increased to 2L     2 Minute Post Oxygen Saturation % 100 %  97% @ 10 min on RA     2 Minute Post Liters of Oxygen 2 L              Oxygen Initial Assessment:  Oxygen Initial Assessment - 01/28/21 1056       Home  Oxygen   Home Oxygen Device None    Sleep Oxygen Prescription None    Home Exercise Oxygen Prescription None    Home Resting Oxygen Prescription None      Initial 6 min Walk   Oxygen Used Continuous    Liters per minute 2      Program Oxygen Prescription   Program Oxygen Prescription Continuous    Liters per minute 2      Intervention   Short Term Goals To learn and exhibit compliance with exercise, home and travel O2 prescription;To learn and understand importance of monitoring SPO2 with pulse oximeter and demonstrate accurate use of the pulse oximeter.;To learn and understand importance of maintaining oxygen saturations>88%;To learn and demonstrate proper pursed lip breathing techniques or other breathing techniques. ;To learn and demonstrate proper use of respiratory medications    Long  Term Goals Exhibits compliance with exercise, home  and travel O2 prescription;Verbalizes importance of monitoring SPO2 with pulse oximeter and return demonstration;Maintenance of O2 saturations>88%;Exhibits proper breathing techniques, such as pursed lip breathing or other method taught during  program session;Compliance with respiratory medication;Demonstrates proper use of MDIs             Oxygen Re-Evaluation:   Oxygen Discharge (Final Oxygen Re-Evaluation):   Initial Exercise Prescription:  Initial Exercise Prescription - 01/28/21 1200       Date of Initial Exercise RX and Referring Provider   Date 01/28/21    Referring Provider [name]    Expected Discharge Date 04/02/21      Oxygen   Oxygen Continuous    Liters 2    Maintain Oxygen Saturation 88% or higher      Recumbant Bike   Level 1    RPM 40    Minutes 15      Arm Ergometer   Level 1    Minutes 15    METs 1.3      Prescription Details   Frequency (times per week) 2    Duration Progress to 30 minutes of continuous aerobic without signs/symptoms of physical distress      Intensity   THRR 40-80% of Max Heartrate 65-131    Ratings of Perceived Exertion 11-13    Perceived Dyspnea 0-4      Progression   Progression Continue to progress workloads to maintain intensity without signs/symptoms of physical distress.      Resistance Training   Training Prescription Yes    Weight red bands    Reps 10-15             Perform Capillary Blood Glucose checks as needed.  Exercise Prescription Changes:   Exercise Comments:   Exercise Goals and Review:   Exercise Goals     Row Name 01/28/21 1210             Exercise Goals   Increase Physical Activity Yes       Intervention Provide advice, education, support and counseling about physical activity/exercise needs.;Develop an individualized exercise prescription for aerobic and resistive training based on initial evaluation findings, risk stratification, comorbidities and participant's personal goals.       Expected Outcomes Short Term: Attend rehab on a regular basis to increase amount of physical activity.;Long Term: Exercising regularly at least 3-5 days a week.       Increase Strength and Stamina Yes       Intervention Develop an  individualized exercise prescription for aerobic and resistive training based on initial evaluation findings, risk stratification, comorbidities and participant's  personal goals.;Provide advice, education, support and counseling about physical activity/exercise needs.       Expected Outcomes Short Term: Increase workloads from initial exercise prescription for resistance, speed, and METs.;Short Term: Perform resistance training exercises routinely during rehab and add in resistance training at home;Long Term: Improve cardiorespiratory fitness, muscular endurance and strength as measured by increased METs and functional capacity (6MWT)       Able to understand and use rate of perceived exertion (RPE) scale Yes       Intervention Provide education and explanation on how to use RPE scale       Expected Outcomes Short Term: Able to use RPE daily in rehab to express subjective intensity level;Long Term:  Able to use RPE to guide intensity level when exercising independently       Able to understand and use Dyspnea scale Yes       Intervention Provide education and explanation on how to use Dyspnea scale       Expected Outcomes Long Term: Able to use Dyspnea scale to guide intensity level when exercising independently       Knowledge and understanding of Target Heart Rate Range (THRR) Yes       Intervention Provide education and explanation of THRR including how the numbers were predicted and where they are located for reference       Expected Outcomes Long Term: Able to use THRR to govern intensity when exercising independently;Short Term: Able to state/look up THRR;Short Term: Able to use daily as guideline for intensity in rehab       Understanding of Exercise Prescription Yes       Intervention Provide education, explanation, and written materials on patient's individual exercise prescription       Expected Outcomes Short Term: Able to explain program exercise prescription;Long Term: Able to explain home  exercise prescription to exercise independently                Exercise Goals Re-Evaluation :   Discharge Exercise Prescription (Final Exercise Prescription Changes):   Nutrition:  Target Goals: Understanding of nutrition guidelines, daily intake of sodium 1500mg , cholesterol 200mg , calories 30% from fat and 7% or less from saturated fats, daily to have 5 or more servings of fruits and vegetables.  Biometrics:  Pre Biometrics - 01/28/21 1105       Pre Biometrics   Grip Strength 25 kg   right hand             Nutrition Therapy Plan and Nutrition Goals:   Nutrition Assessments:  MEDIFICTS Score Key: ?70 Need to make dietary changes  40-70 Heart Healthy Diet ? 40 Therapeutic Level Cholesterol Diet   Picture Your Plate Scores: <97 Unhealthy dietary pattern with much room for improvement. 41-50 Dietary pattern unlikely to meet recommendations for good health and room for improvement. 51-60 More healthful dietary pattern, with some room for improvement.  >60 Healthy dietary pattern, although there may be some specific behaviors that could be improved.    Nutrition Goals Re-Evaluation:   Nutrition Goals Discharge (Final Nutrition Goals Re-Evaluation):   Psychosocial: Target Goals: Acknowledge presence or absence of significant depression and/or stress, maximize coping skills, provide positive support system. Participant is able to verbalize types and ability to use techniques and skills needed for reducing stress and depression.  Initial Review & Psychosocial Screening:  Initial Psych Review & Screening - 01/28/21 1207       Initial Review   Current issues with Current Depression;History of Depression;Current  Anxiety/Panic    Source of Stress Concerns Family;Financial;Transportation;Retirement/disability;Unable to participate in former interests or hobbies;Unable to perform yard/household activities    Comments Amarachi is fearing that she will be homeless  soon. Her currently living situation is in a home that her uncle owns and he is saying that he is going to sell the home. She has not been approved for disability and she is not able to contribute financially to the home cost. She is at the home most of the time by herself. Her uncle and grandmother moves around living with different family members and her grandmother owns a home in [place]. Her grandmother states that she wants [place]ina to live in the home, but she has dementia and her uncle will be making all of the decisions about the home.Zelena has ived in this home since 2008. I contacted a Education officer, museum in the hospital and she came down to see the patient to see if she can provide her any additional input for her situation.She does use Medicaid for her transportation to appointments.She has been unable to do some her interest and do household activities due to her back pain and shortness of breath,      Family Dynamics   Good Support System? No    Strains Intra-family strains    Concerns No support system    Comments She states that she has a "friend" that has been a big help for her with things at the home, because her uncle has refussed to fix things there. Amoria states doesn't have a lot of friends or relatives that she can count on to help her. She states that she and her mother do not have a good relationship.      Barriers   Psychosocial barriers to participate in program Psychosocial barriers identified (see note);The patient should benefit from training in stress management and relaxation.   Her living situation, fear of being homeless and not being approved for disability has caused her a great deal of stress.     Screening Interventions   Interventions Encouraged to exercise;To provide support and resources with identified psychosocial needs;Other (comment)    Comments Had a hospital social worker come to see pt to see if she could provide any additional help with her situation.     Expected Outcomes Short Term goal: Utilizing psychosocial counselor, staff and physician to assist with identification of specific Stressors or current issues interfering with healing process. Setting desired goal for each stressor or current issue identified.;Long Term Goal: Stressors or current issues are controlled or eliminated.;Short Term goal: Identification and review with participant of any Quality of Life or Depression concerns found by scoring the questionnaire.;Long Term goal: The participant improves quality of Life and PHQ9 Scores as seen by post scores and/or verbalization of changes             Quality of Life Scores:  Scores of 19 and below usually indicate a poorer quality of life in these areas.  A difference of  2-3 points is a clinically meaningful difference.  A difference of 2-3 points in the total score of the Quality of Life Index has been associated with significant improvement in overall quality of life, self-image, physical symptoms, and general health in studies assessing change in quality of life.  PHQ-9: Recent Review Flowsheet Data     Depression screen [employer] 2/9 01/28/2021 12/01/2020 10/22/2020 05/27/2020 10/23/2019   Decreased Interest 2 1 3 3 1    Down, Depressed, Hopeless 3 1 3  3 3   PHQ - 2 Score 5 2 6 6 4    Altered sleeping 1 2 1 2 1    Tired, decreased energy 1 1 0 2 1   Change in appetite 0 2 0 2 0   Feeling bad or failure about yourself  1 2 1 2 1    Trouble concentrating 1 2 1 2 2    Moving slowly or fidgety/restless 0 0 1 0 0   Suicidal thoughts 1 1 1  1 1    PHQ-9 Score 10 12 11 17 10    Difficult doing work/chores Somewhat difficult - Very difficult - -      Interpretation of Total Score  Total Score Depression Severity:  1-4 = Minimal depression, 5-9 = Mild depression, 10-14 = Moderate depression, 15-19 = Moderately severe depression, 20-27 = Severe depression   Psychosocial Evaluation and Intervention:  Psychosocial Evaluation - 01/28/21 1251        Psychosocial Evaluation & Interventions   Interventions Stress management education;Relaxation education;Encouraged to exercise with the program and follow exercise prescription    Comments Adamarys has biweekly appointments with online therapist and is on medications for depression. She states that she feels all of this has helped her situation.    Expected Outcomes That Dorla's  psychosocial issues will not proibit her from particiaption in the PR program and be able to manage her stress in a healthy way.    Continue Psychosocial Services  Follow up required by staff             Psychosocial Re-Evaluation:   Psychosocial Discharge (Final Psychosocial Re-Evaluation):   Education: Education Goals: Education classes will be provided on a weekly basis, covering required topics. Participant will state understanding/return demonstration of topics presented.  Learning Barriers/Preferences:   Education Topics: Risk Factor Reduction:  -Group instruction that is supported by a PowerPoint presentation. Instructor discusses the definition of a risk factor, different risk factors for pulmonary disease, and how the heart and lungs work together.     Nutrition for Pulmonary Patient:  -Group instruction provided by PowerPoint slides, verbal discussion, and written materials to support subject matter. The instructor gives an explanation and review of healthy diet recommendations, which includes a discussion on weight management, recommendations for fruit and vegetable consumption, as well as protein, fluid, caffeine, fiber, sodium, sugar, and alcohol. Tips for eating when patients are short of breath are discussed.   Pursed Lip Breathing:  -Group instruction that is supported by demonstration and informational handouts. Instructor discusses the benefits of pursed lip and diaphragmatic breathing and detailed demonstration on how to preform both.     Oxygen Safety:  -Group instruction  provided by PowerPoint, verbal discussion, and written material to support subject matter. There is an overview of What is Oxygen and Why do we need it.  Instructor also reviews how to create a safe environment for oxygen use, the importance of using oxygen as prescribed, and the risks of noncompliance. There is a brief discussion on traveling with oxygen and resources the patient may utilize.   Oxygen Equipment:  -Group instruction provided by [facility] Staff utilizing handouts, written materials, and equipment demonstrations.   Signs and Symptoms:  -Group instruction provided by written material and verbal discussion to support subject matter. Warning signs and symptoms of infection, stroke, and heart attack are reviewed and when to call the physician/911 reinforced. Tips for preventing the spread of infection discussed.   Advanced Directives:  -Group instruction provided by verbal instruction and written  material to support subject matter. Instructor reviews Advanced Directive laws and proper instruction for filling out document.   Pulmonary Video:  -Group video education that reviews the importance of medication and oxygen compliance, exercise, good nutrition, pulmonary hygiene, and pursed lip and diaphragmatic breathing for the pulmonary patient.   Exercise for the Pulmonary Patient:  -Group instruction that is supported by a PowerPoint presentation. Instructor discusses benefits of exercise, core components of exercise, frequency, duration, and intensity of an exercise routine, importance of utilizing pulse oximetry during exercise, safety while exercising, and options of places to exercise outside of rehab.     Pulmonary Medications:  -Verbally interactive group education provided by instructor with focus on inhaled medications and proper administration.   Anatomy and Physiology of the Respiratory System and Intimacy:  -Group instruction provided by PowerPoint, verbal  discussion, and written material to support subject matter. Instructor reviews respiratory cycle and anatomical components of the respiratory system and their functions. Instructor also reviews differences in obstructive and restrictive respiratory diseases with examples of each. Intimacy, Sex, and Sexuality differences are reviewed with a discussion on how relationships can change when diagnosed with pulmonary disease. Common sexual concerns are reviewed.   MD DAY -A group question and answer session with a medical doctor that allows participants to ask questions that relate to their pulmonary disease state.   OTHER EDUCATION -Group or individual verbal, written, or video instructions that support the educational goals of the pulmonary rehab program.   Holiday Eating Survival Tips:  -Group instruction provided by PowerPoint slides, verbal discussion, and written materials to support subject matter. The instructor gives patients tips, tricks, and techniques to help them not only survive but enjoy the holidays despite the onslaught of food that accompanies the holidays.   Knowledge Questionnaire Score:  Knowledge Questionnaire Score - 01/28/21 1205       Knowledge Questionnaire Score   Pre Score 15/18             Core Components/Risk Factors/Patient Goals at Admission:  Personal Goals and Risk Factors at Admission - 01/28/21 1258       Core Components/Risk Factors/Patient Goals on Admission   Tobacco Cessation Yes    Number of packs per day Some days none, on the days that her uncle causes her stress 4-5 cigarettes per day.    Intervention Assist the participant in steps to quit. Provide individualized education and counseling about committing to Tobacco Cessation, relapse prevention, and pharmacological support that can be provided by physician.;Advice worker, assist with locating and accessing local/national Quit Smoking programs, and support quit date choice.     Expected Outcomes Short Term: Will demonstrate readiness to quit, by selecting a quit date.;Short Term: Will quit all tobacco product use, adhering to prevention of relapse plan.;Long Term: Complete abstinence from all tobacco products for at least 12 months from quit date.    Improve shortness of breath with ADL's Yes    Intervention Provide education, individualized exercise plan and daily activity instruction to help decrease symptoms of SOB with activities of daily living.    Expected Outcomes Short Term: Improve cardiorespiratory fitness to achieve a reduction of symptoms when performing ADLs;Long Term: Be able to perform more ADLs without symptoms or delay the onset of symptoms    Increase knowledge of respiratory medications and ability to use respiratory devices properly  Yes    Intervention Provide education and demonstration as needed of appropriate use of medications, inhalers, and oxygen therapy.    Expected  Outcomes Short Term: Achieves understanding of medications use. Understands that oxygen is a medication prescribed by physician. Demonstrates appropriate use of inhaler and oxygen therapy.;Long Term: Maintain appropriate use of medications, inhalers, and oxygen therapy.             Core Components/Risk Factors/Patient Goals Review:    Core Components/Risk Factors/Patient Goals at Discharge (Final Review):    ITP Comments:   Comments: Dr. Rodman Pickle is Medical Director for Pulmonary Rehab at [facility].

## 2021-01-29 ENCOUNTER — Telehealth (HOSPITAL_COMMUNITY): Payer: Self-pay | Admitting: *Deleted

## 2021-01-30 NOTE — Progress Notes (Signed)
Cheryl King - 56 y.o. female MRN [id number]  Date of birth: Mar 03, 1965  Office Visit Note: Visit Date: 01/01/2021 PCP: Charlott Rakes, MD Referred by: Charlott Rakes, MD  Subjective: Chief Complaint  Patient presents with   Lower Back - Pain   Right Foot - Pain   Left Foot - Pain   HPI:  Cheryl King is a 56 y.o. female who comes in today at the request of Dr. Basil Dess for planned Bilateral L5-S1 Lumbar Transforaminal epidural steroid injection with fluoroscopic guidance.  The patient has failed conservative care including home exercise, medications, time and activity modification.  This injection will be diagnostic and hopefully therapeutic.  Please see requesting physician notes for further details and justification.  ROS Otherwise per HPI.  Assessment & Plan: Visit Diagnoses:    ICD-10-CM   1. Lumbar radiculopathy  M54.16 XR C-ARM NO REPORT    Epidural Steroid injection    methylPREDNISolone acetate (DEPO-MEDROL) injection 80 mg      Plan: No additional findings.   Meds & Orders:  Meds ordered this encounter  Medications   methylPREDNISolone acetate (DEPO-MEDROL) injection 80 mg    Orders Placed This Encounter  Procedures   XR C-ARM NO REPORT   Epidural Steroid injection    Follow-up: Return for visit to requesting provider as needed.   Procedures: No procedures performed  Lumbosacral Transforaminal Epidural Steroid Injection - Sub-Pedicular Approach with Fluoroscopic Guidance  Patient: Cheryl King      Date of Birth: 03/30/1965 MRN: [id number] PCP: Charlott Rakes, MD      Visit Date: 01/01/2021   Universal Protocol:    Date/Time: 01/01/2021  Consent Given By: the patient  Position: PRONE  Additional Comments: Vital signs were monitored before and after the procedure. Patient was prepped and draped in the usual sterile fashion. The correct patient, procedure, and site was verified.   Injection Procedure Details:   Procedure  diagnoses: Lumbar radiculopathy [M54.16]    Meds Administered:  Meds ordered this encounter  Medications   methylPREDNISolone acetate (DEPO-MEDROL) injection 80 mg    Laterality: Bilateral  Location/Site: L5  Needle:5.0 in., 22 ga.  Short bevel or Quincke spinal needle  Needle Placement: Transforaminal  Findings:    -Comments: Excellent flow of contrast along the nerve, nerve root and into the epidural space.  Procedure Details: After squaring off the end-plates to get a true AP view, the C-arm was positioned so that an oblique view of the foramen as noted above was visualized. The target area is just inferior to the "nose of the scotty dog" or sub pedicular. The soft tissues overlying this structure were infiltrated with 2-3 ml. of 1% Lidocaine without Epinephrine.  The spinal needle was inserted toward the target using a "trajectory" view along the fluoroscope beam.  Under AP and lateral visualization, the needle was advanced so it did not puncture dura and was located close the 6 O'Clock position of the pedical in AP tracterory. Biplanar projections were used to confirm position. Aspiration was confirmed to be negative for CSF and/or blood. A 1-2 ml. volume of Isovue-250 was injected and flow of contrast was noted at each level. Radiographs were obtained for documentation purposes.   After attaining the desired flow of contrast documented above, a 0.5 to 1.0 ml test dose of 0.25% Marcaine was injected into each respective transforaminal space.  The patient was observed for 90 seconds post injection.  After no sensory deficits were reported, and normal lower extremity motor function was noted,  the above injectate was administered so that equal amounts of the injectate were placed at each foramen (level) into the transforaminal epidural space.   Additional Comments:  The patient tolerated the procedure well Dressing: 2 x 2 sterile gauze and Band-Aid    Post-procedure  details: Patient was observed during the procedure. Post-procedure instructions were reviewed.  Patient left the clinic in stable condition.    Clinical History: MRI LUMBAR SPINE WITHOUT AND WITH CONTRAST   TECHNIQUE: Multiplanar and multiecho pulse sequences of the lumbar spine were obtained without and with intravenous contrast.   CONTRAST:  79mL MULTIHANCE GADOBENATE DIMEGLUMINE 529 MG/ML IV SOLN   COMPARISON:  01/05/2017   FINDINGS: Segmentation:  5 lumbar type vertebrae   Alignment:  Mild scoliosis.  No listhesis   Vertebrae: No fracture, evidence of discitis, or bone lesion. Endplate sclerosis has developed at L2-3 and L5-S1, degenerative appearing.   Conus medullaris and cauda equina: Conus extends to the L1 level. Conus and cauda equina appear normal.   Paraspinal and other soft tissues: No perispinal mass or inflammation noted.   Disc levels:   T11-12: Chronic disc collapse with ventral protrusion.   T12- L1: Unremarkable.   L1-L2: Disc narrowing and bulging with endplate degeneration. Patent canal and foramina   L2-L3: Disc narrowing and bulging with endplate degeneration. Circumferential disc bulging and facet spurring. Mild or moderate left foraminal narrowing   L3-L4: Disc narrowing and bulging with facet spurring. Patent canal and foramina   L4-L5: Disc narrowing and bulging. Degenerative facet spurring greater on the right. Mild-to-moderate right foraminal narrowing   L5-S1:Disc collapse and endplate degeneration with bulge. Degenerative facet spurring on the right more than left. Biforaminal impingement, worse on the right and progressed.   IMPRESSION: 1. Generalized lumbar spine degeneration with progression from 2018, especially at L5-S1 where there is right more than left foraminal impingement. 2. Diffusely patent spinal canal. 3. Mild scoliosis.     Electronically Signed   By: Jorje Guild M.D.   On: 09/30/2020  09:57  01/01/2016 EMG/NCS Impression: Essentially NORMAL electrodiagnostic study of both upper limbs. There was once again very very mild distal slowing at the ulnar nerve at the wrist.This represents no essential change from the prior study in 2016   There is no significant electrodiagnostic evidence of nerve entrapment, brachial plexopathy, cervical radiculopathy or generalized peripheral neuropathy. As you know, purely sensory or demyelinating radiculopathies and chemical radiculitis may not be detected with this particular electrodiagnostic study.  March 2016 EMG/NCS Very mild slowing of the ulnar nerve distally at the wrist but otherwise was normal. That distal slowing was probably temperature artifact but did sort of fit with more numbness tingling in the right fourth and fifth digit.     Objective:  VS:  HT:     WT:    BMI:      BP:108/64   HR:84bpm   TEMP: ( )   RESP:  Physical Exam Vitals and nursing note reviewed.  Constitutional:      General: She is not in acute distress.    Appearance: Normal appearance. She is not ill-appearing.  HENT:     Head: Normocephalic and atraumatic.     Right Ear: External ear normal.     Left Ear: External ear normal.  Eyes:     Extraocular Movements: Extraocular movements intact.  Cardiovascular:     Rate and Rhythm: Normal rate.     Pulses: Normal pulses.  Pulmonary:     Effort: Pulmonary effort  is normal. No respiratory distress.  Abdominal:     General: There is no distension.     Palpations: Abdomen is soft.  Musculoskeletal:        General: Tenderness present.     Cervical back: Neck supple.     Right lower leg: No edema.     Left lower leg: No edema.     Comments: Patient has good distal strength with no pain over the greater trochanters.  No clonus or focal weakness.  Skin:    Findings: No erythema, lesion or rash.  Neurological:     General: No focal deficit present.     Mental Status: She is alert and oriented to person,  place, and time.     Sensory: No sensory deficit.     Motor: No weakness or abnormal muscle tone.     Coordination: Coordination normal.  Psychiatric:        Mood and Affect: Mood normal.        Behavior: Behavior normal.     Imaging: No results found.

## 2021-01-30 NOTE — Procedures (Signed)
Lumbosacral Transforaminal Epidural Steroid Injection - Sub-Pedicular Approach with Fluoroscopic Guidance  Patient: Cheryl King      Date of Birth: 1965/03/19 MRN: [id number] PCP: Charlott Rakes, MD      Visit Date: 01/01/2021   Universal Protocol:    Date/Time: 01/01/2021  Consent Given By: the patient  Position: PRONE  Additional Comments: Vital signs were monitored before and after the procedure. Patient was prepped and draped in the usual sterile fashion. The correct patient, procedure, and site was verified.   Injection Procedure Details:   Procedure diagnoses: Lumbar radiculopathy [M54.16]    Meds Administered:  Meds ordered this encounter  Medications   methylPREDNISolone acetate (DEPO-MEDROL) injection 80 mg    Laterality: Bilateral  Location/Site: L5  Needle:5.0 in., 22 ga.  Short bevel or Quincke spinal needle  Needle Placement: Transforaminal  Findings:    -Comments: Excellent flow of contrast along the nerve, nerve root and into the epidural space.  Procedure Details: After squaring off the end-plates to get a true AP view, the C-arm was positioned so that an oblique view of the foramen as noted above was visualized. The target area is just inferior to the "nose of the scotty dog" or sub pedicular. The soft tissues overlying this structure were infiltrated with 2-3 ml. of 1% Lidocaine without Epinephrine.  The spinal needle was inserted toward the target using a "trajectory" view along the fluoroscope beam.  Under AP and lateral visualization, the needle was advanced so it did not puncture dura and was located close the 6 O'Clock position of the pedical in AP tracterory. Biplanar projections were used to confirm position. Aspiration was confirmed to be negative for CSF and/or blood. A 1-2 ml. volume of Isovue-250 was injected and flow of contrast was noted at each level. Radiographs were obtained for documentation purposes.   After attaining the  desired flow of contrast documented above, a 0.5 to 1.0 ml test dose of 0.25% Marcaine was injected into each respective transforaminal space.  The patient was observed for 90 seconds post injection.  After no sensory deficits were reported, and normal lower extremity motor function was noted,   the above injectate was administered so that equal amounts of the injectate were placed at each foramen (level) into the transforaminal epidural space.   Additional Comments:  The patient tolerated the procedure well Dressing: 2 x 2 sterile gauze and Band-Aid    Post-procedure details: Patient was observed during the procedure. Post-procedure instructions were reviewed.  Patient left the clinic in stable condition.

## 2021-02-02 ENCOUNTER — Ambulatory Visit: Payer: Medicaid Other | Attending: Family Medicine | Admitting: Pharmacist

## 2021-02-02 ENCOUNTER — Other Ambulatory Visit: Payer: Self-pay

## 2021-02-02 DIAGNOSIS — Z23 Encounter for immunization: Secondary | ICD-10-CM

## 2021-02-02 NOTE — Progress Notes (Signed)
Patient presents for vaccination against shingles per orders of Dr. Margarita Rana. Consent given. Counseling provided. No contraindications exists. Vaccine administered without incident.   Benard Halsted, PharmD, [facility], [place] [phone]

## 2021-02-03 ENCOUNTER — Encounter (HOSPITAL_COMMUNITY)
Admission: RE | Admit: 2021-02-03 | Discharge: 2021-02-03 | Disposition: A | Discharge status: Home / Self Care | Payer: Medicaid Other | Source: Ambulatory Visit | Attending: Pulmonary Disease | Admitting: Pulmonary Disease

## 2021-02-03 DIAGNOSIS — J449 Chronic obstructive pulmonary disease, unspecified: Secondary | ICD-10-CM | POA: Diagnosis not present

## 2021-02-03 NOTE — Progress Notes (Signed)
Daily Session Note  Patient Details  Name: Cheryl King MRN: [id number] Date of Birth: [birth year] Referring Provider:   [name] Pulmonary Rehab Walk Test from 01/28/2021 in [place]  Referring Provider [name]       Encounter Date: 02/03/2021  Check In:  Session Check In - 02/03/21 1200       Check-In   Supervising physician immediately available to respond to emergencies [facility] immediately available    Physician(s) Dr. Tana Coast    Location [facility]    Staff Present Maurice Small, RN, BSN;Lisa Ysidro Evert, Cathleen Fears, MS, ACSM-CEP, Exercise Physiologist    Virtual Visit No    Medication changes reported     No    Fall or balance concerns reported    No    Tobacco Cessation No Change    Warm-up and Cool-down Performed as group-led instruction    Resistance Training Performed Yes    VAD Patient? No    PAD/SET Patient? No      Pain Assessment   Currently in Pain? No/denies    Pain Score 0-No pain    Multiple Pain Sites No             Capillary Blood Glucose: No results found for this or any previous visit (from the past 24 hour(s)).    Social History   Tobacco Use  Smoking Status Some Days   Packs/day: 0.25   Years: 35.00   Pack years: 8.75   Types: Cigarettes   Start date: 1981  Smokeless Tobacco Never  Tobacco Comments   Wears patches, 5 cigarettes smoked on some days especially the days that her uncle stress her with leaving the home.Some days smokes none when her uncle is out of town and not in the home.    Goals Met:  Exercise tolerated well No report of concerns or symptoms today Strength training completed today  Goals Unmet:  Not Applicable  Comments: Service time is from 1022 to 1135.    Dr. Rodman Pickle is Medical Director for Pulmonary Rehab at [facility].

## 2021-02-05 ENCOUNTER — Encounter (HOSPITAL_COMMUNITY)
Admission: RE | Admit: 2021-02-05 | Discharge: 2021-02-05 | Disposition: A | Discharge status: Home / Self Care | Payer: Medicaid Other | Source: Ambulatory Visit | Attending: Pulmonary Disease | Admitting: Pulmonary Disease

## 2021-02-05 ENCOUNTER — Other Ambulatory Visit: Payer: Self-pay

## 2021-02-05 DIAGNOSIS — J449 Chronic obstructive pulmonary disease, unspecified: Secondary | ICD-10-CM

## 2021-02-05 NOTE — Progress Notes (Signed)
Daily Session Note  Patient Details  Name: Cheryl King MRN: [id number] Date of Birth: April 30, 1965 Referring Provider:   [name] Pulmonary Rehab Walk Test from 01/28/2021 in [place]  Referring Provider [name]       Encounter Date: 02/05/2021  Check In:  Session Check In - 02/05/21 1203       Check-In   Supervising physician immediately available to respond to emergencies [facility] immediately available    Physician(s) Dr. Kurtis Bushman    [facility]    Staff Present Rodney Langton, Felipe Drone, RN, MHA;Carlette Wilber Oliphant, RN, BSN    Virtual Visit No    Medication changes reported     No    Fall or balance concerns reported    No    Tobacco Cessation No Change    Warm-up and Cool-down Performed as group-led Higher education careers adviser Performed Yes    VAD Patient? No    PAD/SET Patient? No      Pain Assessment   Currently in Pain? No/denies    Pain Score 0-No pain             Capillary Blood Glucose: No results found for this or any previous visit (from the past 24 hour(s)).    Social History   Tobacco Use  Smoking Status Some Days   Packs/day: 0.25   Years: 35.00   Pack years: 8.75   Types: Cigarettes   Start date: 1981  Smokeless Tobacco Never  Tobacco Comments   Wears patches, 5 cigarettes smoked on some days especially the days that her uncle stress her with leaving the home.Some days smokes none when her uncle is out of town and not in the home.    Goals Met:  Exercise tolerated well No report of concerns or symptoms today Strength training completed today  Goals Unmet:  Not Applicable  Comments: Service time is from 1015 to 1132 We discussed smoking cessation and gave pt tip for quitting and the 1-800 quit now line. She states that she was planning for her quit date to be 02/06/21. Congratulated pt and offered emotional support and encouragement. Also gave pt  information on Advanced Directive.   Dr. Rodman Pickle is Medical Director for Pulmonary Rehab at [facility].

## 2021-02-10 ENCOUNTER — Other Ambulatory Visit: Payer: Self-pay

## 2021-02-10 ENCOUNTER — Encounter (HOSPITAL_COMMUNITY)
Admission: RE | Admit: 2021-02-10 | Discharge: 2021-02-10 | Disposition: A | Discharge status: Home / Self Care | Payer: Medicaid Other | Source: Ambulatory Visit | Attending: Pulmonary Disease | Admitting: Pulmonary Disease

## 2021-02-10 VITALS — Wt 167.3 lb

## 2021-02-10 DIAGNOSIS — J449 Chronic obstructive pulmonary disease, unspecified: Secondary | ICD-10-CM

## 2021-02-10 NOTE — Progress Notes (Signed)
Daily Session Note  Patient Details  Name: Kanaya Gunnarson MRN: [id number] Date of Birth: 20-Apr-1965 Referring Provider:   [name] Pulmonary Rehab Walk Test from 01/28/2021 in [place]  Referring Provider [name]       Encounter Date: 02/10/2021  Check In:  Session Check In - 02/10/21 1212       Check-In   Supervising physician immediately available to respond to emergencies [facility] immediately available    Physician(s) Dr. Kurtis Bushman    [facility]    Staff Present Rodney Langton, Felipe Drone, RN, MHA;Carlette Wilber Oliphant, RN, Quentin Ore, MS, ACSM-CEP, Exercise Physiologist;Olinty Celesta Aver, MS, ACSM CEP, Exercise Physiologist;David Lilyan Punt, MS, ACSM-CEP, CCRP, Exercise Physiologist    Virtual Visit No    Medication changes reported     No    Fall or balance concerns reported    No    Tobacco Cessation No Change    Warm-up and Cool-down Performed as group-led instruction    Resistance Training Performed Yes    VAD Patient? No    PAD/SET Patient? No      Pain Assessment   Currently in Pain? No/denies    Pain Score 0-No pain    Multiple Pain Sites No             Capillary Blood Glucose: No results found for this or any previous visit (from the past 24 hour(s)).   Exercise Prescription Changes - 02/10/21 1500       Response to Exercise   Blood Pressure (Admit) 100/62    Blood Pressure (Exercise) 122/58    Blood Pressure (Exit) 104/54    Heart Rate (Admit) 76 bpm    Heart Rate (Exercise) 95 bpm    Heart Rate (Exit) 82 bpm    Oxygen Saturation (Admit) 97 %    Oxygen Saturation (Exercise) 97 %    Oxygen Saturation (Exit) 96 %    Rating of Perceived Exertion (Exercise) 11    Perceived Dyspnea (Exercise) 1    Duration Continue with 30 min of aerobic exercise without signs/symptoms of physical distress.    Intensity THRR unchanged      Progression   Progression Continue to progress  workloads to maintain intensity without signs/symptoms of physical distress.      Resistance Training   Training Prescription Yes    Weight red bands    Reps 10-15    Time 10 Minutes      Oxygen   Oxygen Continuous    Liters 2      Recumbant Bike   Level 1    Minutes 15    METs 2.1      Arm Ergometer   Level 1    Watts 1   49   Minutes 15      Oxygen   Maintain Oxygen Saturation 88% or higher             Social History   Tobacco Use  Smoking Status Some Days   Packs/day: 0.25   Years: 35.00   Pack years: 8.75   Types: Cigarettes   Start date: 62  Smokeless Tobacco Never  Tobacco Comments   Wears patches, 5 cigarettes smoked on some days especially the days that her uncle stress her with leaving the home.Some days smokes none when her uncle is out of town and not in the home.    Goals Met:  Proper associated with RPD/PD & O2 Sat Exercise tolerated well No report of  concerns or symptoms today Strength training completed today  Goals Unmet:  Not Applicable  Comments: Service time is from 1029 to 1143.    Dr. Rodman Pickle is Medical Director for Pulmonary Rehab at [facility].

## 2021-02-11 ENCOUNTER — Other Ambulatory Visit: Payer: Self-pay | Admitting: Specialist

## 2021-02-12 ENCOUNTER — Telehealth (HOSPITAL_COMMUNITY): Payer: Self-pay | Admitting: Licensed Clinical Social Worker

## 2021-02-12 ENCOUNTER — Telehealth (HOSPITAL_COMMUNITY): Payer: Self-pay | Admitting: Family Medicine

## 2021-02-12 ENCOUNTER — Encounter (HOSPITAL_COMMUNITY): Payer: Medicaid Other

## 2021-02-12 NOTE — Telephone Encounter (Signed)
CSW attempted to call pt back to check in and see if any updates in her case.  Unfortunately CSW has been unable to find further assistance options for pt at this time but wanted to see if she was officially being asked to leave the residence so CSW could discuss potential shelter options. Left VM requesting return call.  Will continue to follow and assist as needed  Jorge Ny, [facility] Desk#: [phone] Cell#: [phone]

## 2021-02-17 ENCOUNTER — Encounter (HOSPITAL_COMMUNITY)
Admission: RE | Admit: 2021-02-17 | Discharge: 2021-02-17 | Disposition: A | Discharge status: Home / Self Care | Payer: Medicaid Other | Source: Ambulatory Visit | Attending: Pulmonary Disease | Admitting: Pulmonary Disease

## 2021-02-17 ENCOUNTER — Other Ambulatory Visit: Payer: Self-pay

## 2021-02-17 DIAGNOSIS — J449 Chronic obstructive pulmonary disease, unspecified: Secondary | ICD-10-CM

## 2021-02-17 NOTE — Progress Notes (Signed)
Daily Session Note  Patient Details  Name: Cheryl King MRN: [id number] Date of Birth: January 19, 1966 Referring Provider:   [name] Pulmonary Rehab Walk Test from 01/28/2021 in [place]  Referring Provider [name]       Encounter Date: 02/17/2021  Check In:  Session Check In - 02/17/21 1121       Check-In   Supervising physician immediately available to respond to emergencies [facility] immediately available    Physician(s) Dr. Avon Gully    [facility]    Staff Present Rosebud Poles, RN, BSN;Carlette Wilber Oliphant, RN, BSN;Lisa Ysidro Evert, Felipe Drone, RN, MHA    Virtual Visit No    Medication changes reported     No    Fall or balance concerns reported    No    Tobacco Cessation No Change    Warm-up and Cool-down Performed as group-led instruction    Resistance Training Performed Yes    VAD Patient? No    PAD/SET Patient? No      Pain Assessment   Currently in Pain? No/denies    Multiple Pain Sites No             Capillary Blood Glucose: No results found for this or any previous visit (from the past 24 hour(s)).    Social History   Tobacco Use  Smoking Status Some Days   Packs/day: 0.25   Years: 35.00   Pack years: 8.75   Types: Cigarettes   Start date: 1981  Smokeless Tobacco Never  Tobacco Comments   Wears patches, 5 cigarettes smoked on some days especially the days that her uncle stress her with leaving the home.Some days smokes none when her uncle is out of town and not in the home.    Goals Met:  Proper associated with RPD/PD & O2 Sat Exercise tolerated well No report of concerns or symptoms today Strength training completed today  Goals Unmet:  Not Applicable  Comments: Service time is from 1016 to 1133.    Dr. Rodman Pickle is Medical Director for Pulmonary Rehab at [facility].

## 2021-02-18 ENCOUNTER — Ambulatory Visit (HOSPITAL_COMMUNITY)
Admission: EM | Admit: 2021-02-18 | Discharge: 2021-02-18 | Disposition: A | Discharge status: Home / Self Care | Payer: Medicaid Other | Attending: Emergency Medicine | Admitting: Emergency Medicine

## 2021-02-18 ENCOUNTER — Encounter (HOSPITAL_COMMUNITY): Payer: Self-pay

## 2021-02-18 ENCOUNTER — Other Ambulatory Visit: Payer: Self-pay

## 2021-02-18 DIAGNOSIS — N898 Other specified noninflammatory disorders of vagina: Secondary | ICD-10-CM

## 2021-02-18 LAB — POCT URINALYSIS DIPSTICK, ED / UC
Bilirubin Urine: NEGATIVE
Glucose, UA: NEGATIVE mg/dL
Hgb urine dipstick: NEGATIVE
Ketones, ur: NEGATIVE mg/dL
Nitrite: NEGATIVE
Protein, ur: NEGATIVE mg/dL
Specific Gravity, Urine: 1.025 (ref 1.005–1.030)
Urobilinogen, UA: 0.2 mg/dL (ref 0.0–1.0)
pH: 6.5 (ref 5.0–8.0)

## 2021-02-18 MED ORDER — MICONAZOLE NITRATE 2 % EX CREA: 1.0000 "application " | TOPICAL_CREAM | Freq: Two times a day (BID) | CUTANEOUS | 0 refills | Status: AC

## 2021-02-18 MED ORDER — FLUCONAZOLE 150 MG PO TABS: 150.0000 mg | ORAL_TABLET | ORAL | 0 refills | Status: DC

## 2021-02-18 NOTE — Progress Notes (Signed)
Pulmonary Individual Treatment Plan  Patient Details  Name: Cheryl King MRN: [id number] Date of Birth: [birth year] Referring Provider:   [name] Pulmonary Rehab Walk Test from 01/28/2021 in [facility]  Referring Provider [name]       Initial Encounter Date:  Flowsheet Row Pulmonary Rehab Walk Test from 01/28/2021 in [facility]  Date 01/28/21       Visit Diagnosis: COPD, severe ([facility])  Patient's Home Medications on Admission:   Current Outpatient Medications:    Acetaminophen-Codeine 300-30 MG tablet, TAKE 1-2 TABLETS BY MOUTH EVERY 6 (SIX) HOURS AS NEEDED FOR UP TO 7 DAYS FOR MODERATE PAIN., Disp: 40 tablet, Rfl: 0   albuterol (VENTOLIN HFA) 108 (90 Base) MCG/ACT inhaler, Inhale 2 puffs into the lungs every 6 (six) hours as needed for wheezing or shortness of breath., Disp: 18 g, Rfl: 5   amitriptyline (ELAVIL) 50 MG tablet, TAKE 1 TABLET (50 MG TOTAL) BY MOUTH AT BEDTIME., Disp: 90 tablet, Rfl: 1   atorvastatin (LIPITOR) 20 MG tablet, TAKE 1 TABLET (20 MG TOTAL) BY MOUTH DAILY., Disp: 90 tablet, Rfl: 1   Azelastine HCl 137 MCG/SPRAY SOLN, Place into both nostrils., Disp: , Rfl:    budesonide-formoterol (SYMBICORT) 160-4.5 MCG/ACT inhaler, Inhale 2 puffs into the lungs in the morning and at bedtime., Disp: 10.2 g, Rfl: 3   cetirizine (ZYRTEC) 10 MG tablet, Take 1 tablet (10 mg total) by mouth daily., Disp: 90 tablet, Rfl: 1   cyclobenzaprine (FLEXERIL) 10 MG tablet, TAKE 1 TABLET (10 MG TOTAL) BY MOUTH 2 (TWO) TIMES DAILY AS NEEDED FOR MUSCLE SPASMS., Disp: 180 tablet, Rfl: 1   diclofenac sodium (VOLTAREN) 1 % GEL, Apply 2 g topically 4 (four) times daily., Disp: 5 Tube, Rfl: 2   DULoxetine (CYMBALTA) 60 MG capsule, TAKE 1 CAPSULE (60 MG TOTAL) BY MOUTH DAILY., Disp: 90 capsule, Rfl: 1   ezetimibe (ZETIA) 10 MG tablet, TAKE 1 TABLET (10 MG TOTAL) BY MOUTH DAILY AFTER SUPPER., Disp: 90 tablet, Rfl: 1   fluconazole  (DIFLUCAN) 150 MG tablet, Take 1 tablet (150 mg total) by mouth once a week., Disp: 2 tablet, Rfl: 0   fluticasone (FLONASE) 50 MCG/ACT nasal spray, PLACE 2 SPRAYS INTO BOTH NOSTRILS DAILY., Disp: 16 g, Rfl: 0   Lactobacillus CAPS, Take 1 capsule by mouth daily., Disp: , Rfl:    miconazole (MICATIN) 2 % cream, Apply 1 application topically 2 (two) times daily., Disp: 28.35 g, Rfl: 0   montelukast (SINGULAIR) 10 MG tablet, Take 10 mg by mouth daily., Disp: , Rfl:    Multiple Vitamin (MULTIVITAMIN WITH MINERALS) TABS tablet, Take 1 tablet by mouth daily., Disp: , Rfl:    naproxen (NAPROSYN) 500 MG tablet, TAKE 1 TABLET BY MOUTH TWO TIMES DAILY WITH MEALS, Disp: 60 tablet, Rfl: 3   nicotine (NICODERM CQ) 21 mg/24hr patch, Place 1 patch (21 mg total) onto the skin daily., Disp: 28 patch, Rfl: 0   oxybutynin (DITROPAN) 5 MG tablet, TAKE 1 TABLET (5 MG TOTAL) BY MOUTH 2 (TWO) TIMES DAILY., Disp: 180 tablet, Rfl: 1   pregabalin (LYRICA) 75 MG capsule, Take 1 capsule (75 mg total) by mouth 2 (two) times daily., Disp: 60 capsule, Rfl: 5   RESTASIS 0.05 % ophthalmic emulsion, , Disp: , Rfl: 3   Tiotropium Bromide Monohydrate (SPIRIVA RESPIMAT) 2.5 MCG/ACT AERS, Inhale 2 puffs into the lungs daily., Disp: 1 each, Rfl: 12   triamcinolone cream (KENALOG) 0.1 %, Apply  1 application topically 2 (two) times daily., Disp: 45 g, Rfl: 1  Current Facility-Administered Medications:    0.9 %  sodium chloride infusion, 500 mL, Intravenous, Once, Danis, Kirke Corin, MD  Past Medical History: Past Medical History:  Diagnosis Date   Acid reflux    Allergy    Carpal tunnel syndrome    COPD (chronic obstructive pulmonary disease) (HCC)    Depression    Diverticulitis    Hyperlipidemia    Low back pain    Neck pain    Sacroiliac inflammation (HCC)    Sciatica    Seasonal allergies     Tobacco Use: Social History   Tobacco Use  Smoking Status Some Days   Packs/day: 0.25   Years: 35.00   Pack years: 8.75    Types: Cigarettes   Start date: 75  Smokeless Tobacco Never  Tobacco Comments   Wears patches, 5 cigarettes smoked on some days especially the days that her uncle stress her with leaving the home.Some days smokes none when her uncle is out of town and not in the home.    Labs: Recent Review Flowsheet Data     Labs for ITP Cardiac and Pulmonary Rehab Latest Ref Rng & Units 09/20/2018 07/04/2019 01/28/2020 09/30/2020 12/01/2020   Cholestrol 100 - 199 mg/dL 202(H) 192 191 197 146   LDLCALC 0 - 99 mg/dL 120(H) 116(H) 117(H) 120(H) 63   HDL >39 mg/dL 67 64 60 63 70   Trlycerides 0 - 149 mg/dL 83 65 74 80 65   Hemoglobin A1c 0.0 - 7.0 % 5.6 5.6(A) - - 5.9       Capillary Blood Glucose: Lab Results  Component Value Date   GLUCAP 79 11/10/2011     Pulmonary Assessment Scores:  Pulmonary Assessment Scores     Row Name 01/28/21 1205         ADL UCSD   SOB Score total 48       CAT Score   CAT Score 26       mMRC Score   mMRC Score 4             UCSD: Self-administered rating of dyspnea associated with activities of daily living (ADLs) 6-point scale (0 = "not at all" to 5 = "maximal or unable to do because of breathlessness")  Scoring Scores range from 0 to 120.  Minimally important difference is 5 units  CAT: CAT can identify the health impairment of COPD patients and is better correlated with disease progression.  CAT has a scoring range of zero to 40. The CAT score is classified into four groups of low (less than 10), medium (10 - 20), high (21-30) and very high (31-40) based on the impact level of disease on health status. A CAT score over 10 suggests significant symptoms.  A worsening CAT score could be explained by an exacerbation, poor medication adherence, poor inhaler technique, or progression of COPD or comorbid conditions.  CAT MCID is 2 points  mMRC: mMRC (Modified Medical Research Council) Dyspnea Scale is used to assess the degree of baseline functional  disability in patients of respiratory disease due to dyspnea. No minimal important difference is established. A decrease in score of 1 point or greater is considered a positive change.   Pulmonary Function Assessment:  Pulmonary Function Assessment - 01/28/21 1057       Breath   Bilateral Breath Sounds Clear    Shortness of Breath Fear of Shortness of Breath;Limiting activity;Panic with  Shortness of Breath             Exercise Target Goals: Exercise Program Goal: Individual exercise prescription set using results from initial 6 min walk test and THRR while considering  patients activity barriers and safety.   Exercise Prescription Goal: Initial exercise prescription builds to 30-45 minutes a day of aerobic activity, 2-3 days per week.  Home exercise guidelines will be given to patient during program as part of exercise prescription that the participant will acknowledge.  Activity Barriers & Risk Stratification:  Activity Barriers & Cardiac Risk Stratification - 01/28/21 1054       Activity Barriers & Cardiac Risk Stratification   Activity Barriers Deconditioning;Back Problems;Shortness of Breath;Neck/Spine Problems    Cardiac Risk Stratification Low             6 Minute Walk:  6 Minute Walk     Row Name 01/28/21 1211         6 Minute Walk   Phase Initial     Distance 1028 feet     Walk Time 6 minutes     # of Rest Breaks 2  1:30-2:40, 5:40-6:00. Due to desat     MPH 1.95     METS 2.98     RPE 11     Perceived Dyspnea  3     VO2 Peak 10.42     Symptoms No     Resting HR 73 bpm     Resting BP 120/60     Resting Oxygen Saturation  94 %     Exercise Oxygen Saturation  during 6 min walk 84 %     Max Ex. HR 98 bpm     Max Ex. BP 120/60     2 Minute Post BP 116/60       Interval HR   1 Minute HR 85     2 Minute HR 88  1:19- 84     3 Minute HR 91     4 Minute HR 92     5 Minute HR 91  5:40- 98     6 Minute HR 90     2 Minute Post HR 67     Interval  Heart Rate? Yes       Interval Oxygen   Interval Oxygen? Yes     Baseline Oxygen Saturation % 94 %     1 Minute Oxygen Saturation % 85 %     1 Minute Liters of Oxygen 0 L     2 Minute Oxygen Saturation % 84 %  84% @ 1:19     2 Minute Liters of Oxygen 0 L  Increased to 1L     3 Minute Oxygen Saturation % 88 %     3 Minute Liters of Oxygen 1 L     4 Minute Oxygen Saturation % 88 %     4 Minute Liters of Oxygen 1 L     5 Minute Oxygen Saturation % 86 %     5 Minute Liters of Oxygen 1 L     6 Minute Oxygen Saturation % 86 %  84% @ 5:40     6 Minute Liters of Oxygen 2 L  Increased to 2L     2 Minute Post Oxygen Saturation % 100 %  97% @ 10 min on RA     2 Minute Post Liters of Oxygen 2 L              Oxygen Initial  Assessment:  Oxygen Initial Assessment - 01/28/21 1056       Home Oxygen   Home Oxygen Device None    Sleep Oxygen Prescription None    Home Exercise Oxygen Prescription None    Home Resting Oxygen Prescription None      Initial 6 min Walk   Oxygen Used Continuous    Liters per minute 2      Program Oxygen Prescription   Program Oxygen Prescription Continuous    Liters per minute 2      Intervention   Short Term Goals To learn and exhibit compliance with exercise, home and travel O2 prescription;To learn and understand importance of monitoring SPO2 with pulse oximeter and demonstrate accurate use of the pulse oximeter.;To learn and understand importance of maintaining oxygen saturations>88%;To learn and demonstrate proper pursed lip breathing techniques or other breathing techniques. ;To learn and demonstrate proper use of respiratory medications    Long  Term Goals Exhibits compliance with exercise, home  and travel O2 prescription;Verbalizes importance of monitoring SPO2 with pulse oximeter and return demonstration;Maintenance of O2 saturations>88%;Exhibits proper breathing techniques, such as pursed lip breathing or other method taught during program  session;Compliance with respiratory medication;Demonstrates proper use of MDIs             Oxygen Re-Evaluation:  Oxygen Re-Evaluation     Row Name 02/09/21 1110             Program Oxygen Prescription   Program Oxygen Prescription Continuous       Liters per minute 2         Home Oxygen   Home Oxygen Device None       Sleep Oxygen Prescription None       Home Exercise Oxygen Prescription None       Home Resting Oxygen Prescription None         Goals/Expected Outcomes   Short Term Goals To learn and exhibit compliance with exercise, home and travel O2 prescription;To learn and understand importance of monitoring SPO2 with pulse oximeter and demonstrate accurate use of the pulse oximeter.;To learn and understand importance of maintaining oxygen saturations>88%;To learn and demonstrate proper pursed lip breathing techniques or other breathing techniques. ;To learn and demonstrate proper use of respiratory medications       Long  Term Goals Exhibits compliance with exercise, home  and travel O2 prescription;Verbalizes importance of monitoring SPO2 with pulse oximeter and return demonstration;Maintenance of O2 saturations>88%;Exhibits proper breathing techniques, such as pursed lip breathing or other method taught during program session;Compliance with respiratory medication;Demonstrates proper use of MDIs       Goals/Expected Outcomes Compliance and understanding of oxygen saturation and breathing techniques to decrease shortness of breath.                Oxygen Discharge (Final Oxygen Re-Evaluation):  Oxygen Re-Evaluation - 02/09/21 1110       Program Oxygen Prescription   Program Oxygen Prescription Continuous    Liters per minute 2      Home Oxygen   Home Oxygen Device None    Sleep Oxygen Prescription None    Home Exercise Oxygen Prescription None    Home Resting Oxygen Prescription None      Goals/Expected Outcomes   Short Term Goals To learn and exhibit  compliance with exercise, home and travel O2 prescription;To learn and understand importance of monitoring SPO2 with pulse oximeter and demonstrate accurate use of the pulse oximeter.;To learn and understand importance of maintaining oxygen saturations>88%;To  learn and demonstrate proper pursed lip breathing techniques or other breathing techniques. ;To learn and demonstrate proper use of respiratory medications    Long  Term Goals Exhibits compliance with exercise, home  and travel O2 prescription;Verbalizes importance of monitoring SPO2 with pulse oximeter and return demonstration;Maintenance of O2 saturations>88%;Exhibits proper breathing techniques, such as pursed lip breathing or other method taught during program session;Compliance with respiratory medication;Demonstrates proper use of MDIs    Goals/Expected Outcomes Compliance and understanding of oxygen saturation and breathing techniques to decrease shortness of breath.             Initial Exercise Prescription:  Initial Exercise Prescription - 01/28/21 1200       Date of Initial Exercise RX and Referring Provider   Date 01/28/21    Referring Provider [name]    Expected Discharge Date 04/02/21      Oxygen   Oxygen Continuous    Liters 2    Maintain Oxygen Saturation 88% or higher      Recumbant Bike   Level 1    RPM 40    Minutes 15      Arm Ergometer   Level 1    Minutes 15    METs 1.3      Prescription Details   Frequency (times per week) 2    Duration Progress to 30 minutes of continuous aerobic without signs/symptoms of physical distress      Intensity   THRR 40-80% of Max Heartrate 65-131    Ratings of Perceived Exertion 11-13    Perceived Dyspnea 0-4      Progression   Progression Continue to progress workloads to maintain intensity without signs/symptoms of physical distress.      Resistance Training   Training Prescription Yes    Weight red bands    Reps 10-15             Perform Capillary  Blood Glucose checks as needed.  Exercise Prescription Changes:   Exercise Prescription Changes     Row Name 02/10/21 1500             Response to Exercise   Blood Pressure (Admit) 100/62       Blood Pressure (Exercise) 122/58       Blood Pressure (Exit) 104/54       Heart Rate (Admit) 76 bpm       Heart Rate (Exercise) 95 bpm       Heart Rate (Exit) 82 bpm       Oxygen Saturation (Admit) 97 %       Oxygen Saturation (Exercise) 97 %       Oxygen Saturation (Exit) 96 %       Rating of Perceived Exertion (Exercise) 11       Perceived Dyspnea (Exercise) 1       Duration Continue with 30 min of aerobic exercise without signs/symptoms of physical distress.       Intensity THRR unchanged         Progression   Progression Continue to progress workloads to maintain intensity without signs/symptoms of physical distress.         Resistance Training   Training Prescription Yes       Weight red bands       Reps 10-15       Time 10 Minutes         Oxygen   Oxygen Continuous       Liters 2  Recumbant Bike   Level 1       Minutes 15       METs 2.1         Arm Ergometer   Level 1       Watts 1  49       Minutes 15         Oxygen   Maintain Oxygen Saturation 88% or higher                Exercise Comments:   Exercise Comments     Row Name 02/03/21 1209           Exercise Comments Pt completed first day of exercise. She exercised for 15 min on the recumbent bike and arm ergometer. Felisha averaged 2.1 METS on the recumbent bike and 1 watt on the arm ergometer. She performed the warmup and cooldown standing without complaints.                Exercise Goals and Review:   Exercise Goals     Row Name 01/28/21 1210 02/09/21 1107           Exercise Goals   Increase Physical Activity Yes Yes      Intervention Provide advice, education, support and counseling about physical activity/exercise needs.;Develop an individualized exercise prescription for  aerobic and resistive training based on initial evaluation findings, risk stratification, comorbidities and participant's personal goals. Provide advice, education, support and counseling about physical activity/exercise needs.;Develop an individualized exercise prescription for aerobic and resistive training based on initial evaluation findings, risk stratification, comorbidities and participant's personal goals.      Expected Outcomes Short Term: Attend rehab on a regular basis to increase amount of physical activity.;Long Term: Exercising regularly at least 3-5 days a week. Short Term: Attend rehab on a regular basis to increase amount of physical activity.;Long Term: Exercising regularly at least 3-5 days a week.      Increase Strength and Stamina Yes Yes      Intervention Develop an individualized exercise prescription for aerobic and resistive training based on initial evaluation findings, risk stratification, comorbidities and participant's personal goals.;Provide advice, education, support and counseling about physical activity/exercise needs. Develop an individualized exercise prescription for aerobic and resistive training based on initial evaluation findings, risk stratification, comorbidities and participant's personal goals.;Provide advice, education, support and counseling about physical activity/exercise needs.      Expected Outcomes Short Term: Increase workloads from initial exercise prescription for resistance, speed, and METs.;Short Term: Perform resistance training exercises routinely during rehab and add in resistance training at home;Long Term: Improve cardiorespiratory fitness, muscular endurance and strength as measured by increased METs and functional capacity (6MWT) Short Term: Increase workloads from initial exercise prescription for resistance, speed, and METs.;Short Term: Perform resistance training exercises routinely during rehab and add in resistance training at home;Long Term:  Improve cardiorespiratory fitness, muscular endurance and strength as measured by increased METs and functional capacity (6MWT)      Able to understand and use rate of perceived exertion (RPE) scale Yes Yes      Intervention Provide education and explanation on how to use RPE scale Provide education and explanation on how to use RPE scale      Expected Outcomes Short Term: Able to use RPE daily in rehab to express subjective intensity level;Long Term:  Able to use RPE to guide intensity level when exercising independently Short Term: Able to use RPE daily in rehab to express subjective intensity level;Long Term:  Able  to use RPE to guide intensity level when exercising independently      Able to understand and use Dyspnea scale Yes Yes      Intervention Provide education and explanation on how to use Dyspnea scale Provide education and explanation on how to use Dyspnea scale      Expected Outcomes Long Term: Able to use Dyspnea scale to guide intensity level when exercising independently Long Term: Able to use Dyspnea scale to guide intensity level when exercising independently      Knowledge and understanding of Target Heart Rate Range (THRR) Yes Yes      Intervention Provide education and explanation of THRR including how the numbers were predicted and where they are located for reference Provide education and explanation of THRR including how the numbers were predicted and where they are located for reference      Expected Outcomes Long Term: Able to use THRR to govern intensity when exercising independently;Short Term: Able to state/look up THRR;Short Term: Able to use daily as guideline for intensity in rehab Long Term: Able to use THRR to govern intensity when exercising independently;Short Term: Able to state/look up THRR;Short Term: Able to use daily as guideline for intensity in rehab      Understanding of Exercise Prescription Yes Yes      Intervention Provide education, explanation, and written  materials on patient's individual exercise prescription Provide education, explanation, and written materials on patient's individual exercise prescription      Expected Outcomes Short Term: Able to explain program exercise prescription;Long Term: Able to explain home exercise prescription to exercise independently Short Term: Able to explain program exercise prescription;Long Term: Able to explain home exercise prescription to exercise independently               Exercise Goals Re-Evaluation :  Exercise Goals Re-Evaluation     [place] Name 02/09/21 1107             Exercise Goal Re-Evaluation   Exercise Goals Review Increase Physical Activity;Increase Strength and Stamina;Able to understand and use rate of perceived exertion (RPE) scale;Able to understand and use Dyspnea scale;Knowledge and understanding of Target Heart Rate Range (THRR);Understanding of Exercise Prescription       Comments Monty has complete 2 exercise sessions. She exercises for 15 min on the recumbent bike and arm ergometer. She averages 2.2 METs at level 1 on the recumbent bike and 1 watt at level 1 on the arm ergometer. It is too soon to note any discernable progressions for both exercise modes. She performs the warmup and cooldown standing without limitations. Will continue to monitor and progress as able.       Expected Outcomes Through exercise at rehab and home, the patient will decrease shortness of breath with daily activities and feel confident in carrying out and exercise regimen at home.                Discharge Exercise Prescription (Final Exercise Prescription Changes):  Exercise Prescription Changes - 02/10/21 1500       Response to Exercise   Blood Pressure (Admit) 100/62    Blood Pressure (Exercise) 122/58    Blood Pressure (Exit) 104/54    Heart Rate (Admit) 76 bpm    Heart Rate (Exercise) 95 bpm    Heart Rate (Exit) 82 bpm    Oxygen Saturation (Admit) 97 %    Oxygen Saturation (Exercise)  97 %    Oxygen Saturation (Exit) 96 %    Rating of Perceived  Exertion (Exercise) 11    Perceived Dyspnea (Exercise) 1    Duration Continue with 30 min of aerobic exercise without signs/symptoms of physical distress.    Intensity THRR unchanged      Progression   Progression Continue to progress workloads to maintain intensity without signs/symptoms of physical distress.      Resistance Training   Training Prescription Yes    Weight red bands    Reps 10-15    Time 10 Minutes      Oxygen   Oxygen Continuous    Liters 2      Recumbant Bike   Level 1    Minutes 15    METs 2.1      Arm Ergometer   Level 1    Watts 1   49   Minutes 15      Oxygen   Maintain Oxygen Saturation 88% or higher             Nutrition:  Target Goals: Understanding of nutrition guidelines, daily intake of sodium <155m, cholesterol <2063m calories 30% from fat and 7% or less from saturated fats, daily to have 5 or more servings of fruits and vegetables.  Biometrics:  Pre Biometrics - 01/28/21 1105       Pre Biometrics   Grip Strength 25 kg   right hand             Nutrition Therapy Plan and Nutrition Goals:   Nutrition Assessments:  MEDIFICTS Score Key: ?70 Need to make dietary changes  40-70 Heart Healthy Diet ? 40 Therapeutic Level Cholesterol Diet   Picture Your Plate Scores: <4<20nhealthy dietary pattern with much room for improvement. 41-50 Dietary pattern unlikely to meet recommendations for good health and room for improvement. 51-60 More healthful dietary pattern, with some room for improvement.  >60 Healthy dietary pattern, although there may be some specific behaviors that could be improved.    Nutrition Goals Re-Evaluation:   Nutrition Goals Discharge (Final Nutrition Goals Re-Evaluation):   Psychosocial: Target Goals: Acknowledge presence or absence of significant depression and/or stress, maximize coping skills, provide positive support system.  Participant is able to verbalize types and ability to use techniques and skills needed for reducing stress and depression.  Initial Review & Psychosocial Screening:  Initial Psych Review & Screening - 01/28/21 1207       Initial Review   Current issues with Current Depression;History of Depression;Current Anxiety/Panic    Source of Stress Concerns Family;Financial;Transportation;Retirement/disability;Unable to participate in former interests or hobbies;Unable to perform yard/household activities    Comments TaJovans fearing that she will be homeless soon. Her currently living situation is in a home that her uncle owns and he is saying that he is going to sell the home. She has not been approved for disability and she is not able to contribute financially to the home cost. She is at the home most of the time by herself. Her uncle and grandmother moves around living with different family members [place] live in the home, but she has dementia and her uncle will be making all of the decisions about the home.TaEuleneas ived in this home since 2008. I [facility] a [facility] and she came down to see the patient to see if she can provide her any additional input for her situation.She does use Medicaid for her transportation to appointments.She has been unable to  do some her interest and do household activities due to her back pain and shortness of breath,      Family Dynamics   Good Support System? No    Strains Intra-family strains    Concerns No support system    Comments She states that she has a "friend" that has been a big help for her with things at the home, because her uncle has refussed to fix things there. Leonarda states doesn't have a lot of friends or relatives that she can count on to help her. She states that she and her mother do not have a good relationship.      Barriers    Psychosocial barriers to participate in program Psychosocial barriers identified (see note);The patient should benefit from training in stress management and relaxation.   Her living situation, fear of being homeless and not being approved for disability has caused her a great deal of stress.     Screening Interventions   Interventions Encouraged to exercise;To provide support and resources with identified psychosocial needs;Other (comment)    Comments Had a hospital social worker come to see pt to see if she could provide any additional help with her situation.    Expected Outcomes Short Term goal: Utilizing psychosocial counselor, staff and physician to assist with identification of specific Stressors or current issues interfering with healing process. Setting desired goal for each stressor or current issue identified.;Long Term Goal: Stressors or current issues are controlled or eliminated.;Short Term goal: Identification and review with participant of any Quality of Life or Depression concerns found by scoring the questionnaire.;Long Term goal: The participant improves quality of Life and PHQ9 Scores as seen by post scores and/or verbalization of changes             Quality of Life Scores:  Scores of 19 and below usually indicate a poorer quality of life in these areas.  A difference of  2-3 points is a clinically meaningful difference.  A difference of 2-3 points in the total score of the Quality of Life Index has been associated with significant improvement in overall quality of life, self-image, physical symptoms, and general health in studies assessing change in quality of life.  PHQ-9: Recent Review Flowsheet Data     Depression screen [facility] 2/9 01/28/2021 12/01/2020 10/22/2020 05/27/2020 10/23/2019   Decreased Interest _0 Down, Depressed, Hopeless _1 PHQ - 2 Score _2 Altered sleeping _3 Tired, decreased energy 1 1 0 2 1   Change in appetite 0 2 0 2 0    Feeling bad or failure about yourself  _4 Trouble concentrating _5 Moving slowly or fidgety/restless 0 0 1 0 0   Suicidal thoughts _6 PHQ-9 Score _7 Difficult doing work/chores Somewhat difficult - Very difficult - -      Interpretation of Total Score  Total Score Depression Severity:  1-4 = Minimal depression, 5-9 = Mild depression, 10-14 = Moderate depression, 15-19 = Moderately severe depression, 20-27 = Severe depression   Psychosocial Evaluation and Intervention:  Psychosocial Evaluation - 01/28/21 1251       Psychosocial Evaluation & Interventions   Interventions Stress management education;Relaxation education;Encouraged to exercise with the program and follow exercise prescription  Comments Atleigh has biweekly appointments with online therapist and is on medications for depression. She states that she feels all of this has helped her situation.    Expected Outcomes That Mariacristina's  psychosocial issues will not proibit her from particiaption in the PR program and be able to manage her stress in a healthy way.    Continue Psychosocial Services  Follow up required by staff             Psychosocial Re-Evaluation:  Psychosocial Re-Evaluation     [place] Name 02/18/21 1600             Psychosocial Re-Evaluation   Current issues with Current Depression;History of Depression;Current Anxiety/Panic       Comments Hasini situation has improved that she has been approved for her disabiltiy. She is vey excited about trying to find heself a place to live. She feels this will significantly improve her depression and anxiety about becoming homeless. Despite her psychosocial barriers she has been able to participate in the PR program and do well. She has attended 4 sessions and been able to to increase her workloads and MET levels. She missed 1 class due to a migraine headache.       Expected Outcomes For Jaclin to continue to progress with  her workloads and MET levels.Assist her as we are able with her moving forward in her new living situation.       Interventions Encouraged to attend Pulmonary Rehabilitation for the exercise       Continue Psychosocial Services  Follow up required by staff                Psychosocial Discharge (Final Psychosocial Re-Evaluation):  Psychosocial Re-Evaluation - 02/18/21 1600       Psychosocial Re-Evaluation   Current issues with Current Depression;History of Depression;Current Anxiety/Panic    Comments Tiarra situation has improved that she has been approved for her disabiltiy. She is vey excited about trying to find heself a place to live. She feels this will significantly improve her depression and anxiety about becoming homeless. Despite her psychosocial barriers she has been able to participate in the PR program and do well. She has attended 4 sessions and been able to to increase her workloads and MET levels. She missed 1 class due to a migraine headache.    Expected Outcomes For Eppie to continue to progress with her workloads and MET levels.Assist her as we are able with her moving forward in her new living situation.    Interventions Encouraged to attend Pulmonary Rehabilitation for the exercise    Continue Psychosocial Services  Follow up required by staff             Education: Education Goals: Education classes will be provided on a weekly basis, covering required topics. Participant will state understanding/return demonstration of topics presented.  Learning Barriers/Preferences:   Education Topics: Risk Factor Reduction:  -Group instruction that is supported by a PowerPoint presentation. Instructor discusses the definition of a risk factor, different risk factors for pulmonary disease, and how the heart and lungs work together.     Nutrition for Pulmonary Patient:  -Group instruction provided by PowerPoint slides, verbal discussion, and written materials to support  subject matter. The instructor gives an explanation and review of healthy diet recommendations, which includes a discussion on weight management, recommendations for fruit and vegetable consumption, as well as protein, fluid, caffeine, fiber, sodium, sugar, and alcohol. Tips for eating when patients are short of breath are  discussed. Flowsheet Row PULMONARY REHAB CHRONIC OBSTRUCTIVE PULMONARY DISEASE from 02/05/2021 in [place]  Date 02/05/21  Educator Handout       Pursed Lip Breathing:  -Group instruction that is supported by demonstration and informational handouts. Instructor discusses the benefits of pursed lip and diaphragmatic breathing and detailed demonstration on how to preform both.     Oxygen Safety:  -Group instruction provided by PowerPoint, verbal discussion, and written material to support subject matter. There is an overview of What is Oxygen and Why do we need it.  Instructor also reviews how to create a safe environment for oxygen use, the importance of using oxygen as prescribed, and the risks of noncompliance. There is a brief discussion on traveling with oxygen and resources the patient may utilize.   Oxygen Equipment:  -Group instruction provided by [facility] Staff utilizing handouts, written materials, and equipment demonstrations.   Signs and Symptoms:  -Group instruction provided by written material and verbal discussion to support subject matter. Warning signs and symptoms of infection, stroke, and heart attack are reviewed and when to call the physician/911 reinforced. Tips for preventing the spread of infection discussed.   Advanced Directives:  -Group instruction provided by verbal instruction and written material to support subject matter. Instructor reviews Advanced Directive laws and proper instruction for filling out document.   Pulmonary Video:  -Group video education that reviews the importance of medication and  oxygen compliance, exercise, good nutrition, pulmonary hygiene, and pursed lip and diaphragmatic breathing for the pulmonary patient.   Exercise for the Pulmonary Patient:  -Group instruction that is supported by a PowerPoint presentation. Instructor discusses benefits of exercise, core components of exercise, frequency, duration, and intensity of an exercise routine, importance of utilizing pulse oximetry during exercise, safety while exercising, and options of places to exercise outside of rehab.     Pulmonary Medications:  -Verbally interactive group education provided by instructor with focus on inhaled medications and proper administration.   Anatomy and Physiology of the Respiratory System and Intimacy:  -Group instruction provided by PowerPoint, verbal discussion, and written material to support subject matter. Instructor reviews respiratory cycle and anatomical components of the respiratory system and their functions. Instructor also reviews differences in obstructive and restrictive respiratory diseases with examples of each. Intimacy, Sex, and Sexuality differences are reviewed with a discussion on how relationships can change when diagnosed with pulmonary disease. Common sexual concerns are reviewed.   MD DAY -A group question and answer session with a medical doctor that allows participants to ask questions that relate to their pulmonary disease state.   OTHER EDUCATION -Group or individual verbal, written, or video instructions that support the educational goals of the pulmonary rehab program.   Holiday Eating Survival Tips:  -Group instruction provided by PowerPoint slides, verbal discussion, and written materials to support subject matter. The instructor gives patients tips, tricks, and techniques to help them not only survive but enjoy the holidays despite the onslaught of food that accompanies the holidays.   Knowledge Questionnaire Score:  Knowledge Questionnaire Score -  01/28/21 1205       Knowledge Questionnaire Score   Pre Score 15/18             Core Components/Risk Factors/Patient Goals at Admission:  Personal Goals and Risk Factors at Admission - 01/28/21 1258       Core Components/Risk Factors/Patient Goals on Admission   Tobacco Cessation Yes    Number of packs per day Some days  none, on the days that her uncle causes her stress 4-5 cigarettes per day.    Intervention Assist the participant in steps to quit. Provide individualized education and counseling about committing to Tobacco Cessation, relapse prevention, and pharmacological support that can be provided by physician.;Advice worker, assist with locating and accessing local/national Quit Smoking programs, and support quit date choice.    Expected Outcomes Short Term: Will demonstrate readiness to quit, by selecting a quit date.;Short Term: Will quit all tobacco product use, adhering to prevention of relapse plan.;Long Term: Complete abstinence from all tobacco products for at least 12 months from quit date.    Improve shortness of breath with ADL's Yes    Intervention Provide education, individualized exercise plan and daily activity instruction to help decrease symptoms of SOB with activities of daily living.    Expected Outcomes Short Term: Improve cardiorespiratory fitness to achieve a reduction of symptoms when performing ADLs;Long Term: Be able to perform more ADLs without symptoms or delay the onset of symptoms    Increase knowledge of respiratory medications and ability to use respiratory devices properly  Yes    Intervention Provide education and demonstration as needed of appropriate use of medications, inhalers, and oxygen therapy.    Expected Outcomes Short Term: Achieves understanding of medications use. Understands that oxygen is a medication prescribed by physician. Demonstrates appropriate use of inhaler and oxygen therapy.;Long Term: Maintain appropriate use of  medications, inhalers, and oxygen therapy.             Core Components/Risk Factors/Patient Goals Review:   Goals and Risk Factor Review     Row Name 02/18/21 1616             Core Components/Risk Factors/Patient Goals Review   Personal Goals Review Tobacco Cessation;Improve shortness of breath with ADL's;Develop more efficient breathing techniques such as purse lipped breathing and diaphragmatic breathing and practicing self-pacing with activity.;Increase knowledge of respiratory medications and ability to use respiratory devices properly.       Review Zaya was off to a slow start with her progression of exercise. Her last session of exercise after finding out that she had been approved for her disabilityshe made a big imporvement. She increased her workload and MEt level on the recumbent bike and walked 14 laps on the track. She has been exercising on oxygen 2 liters her saturations have been from 91-97%. She has no home oxygen.       Expected Outcomes For Alexee to continue to progress in her workloads and MET levels, Improve her SOB with ADL and to use better breathing techniques.Complete tobacco cessation. Some days she has none,other days she gets stressed and will smoke some.                Core Components/Risk Factors/Patient Goals at Discharge (Final Review):   Goals and Risk Factor Review - 02/18/21 1616       Core Components/Risk Factors/Patient Goals Review   Personal Goals Review Tobacco Cessation;Improve shortness of breath with ADL's;Develop more efficient breathing techniques such as purse lipped breathing and diaphragmatic breathing and practicing self-pacing with activity.;Increase knowledge of respiratory medications and ability to use respiratory devices properly.    Review Rennae was off to a slow start with her progression of exercise. Her last session of exercise after finding out that she had been approved for her disabilityshe made a big imporvement.  She increased her workload and MEt level on the recumbent bike and walked 14 laps on the  track. She has been exercising on oxygen 2 liters her saturations have been from 91-97%. She has no home oxygen.    Expected Outcomes For Rashaun to continue to progress in her workloads and MET levels, Improve her SOB with ADL and to use better breathing techniques.Complete tobacco cessation. Some days she has none,other days she gets stressed and will smoke some.             ITP Comments:   Comments: Dr. Rodman Pickle is Medical Director for Pulmonary Rehab at [facility].

## 2021-02-18 NOTE — ED Provider Notes (Signed)
[place]    CSN: [id number] Arrival date & time: 02/18/21  0849      History   Chief Complaint Chief Complaint  Patient presents with   Dysuria    HPI Cheryl King is a 56 y.o. female. Pt reports intermittent burning with urination for 2 weeks. Reports got steroid injections in her back just prior to symptoms starting and believes she has vaginitis as this is listed as a side effect of the steroid injections. Also c/o throat/mouth irritation and white bumps on tongue. Uses symbicort and reports she does rinse her mouth out daily.     Dysuria Associated symptoms: no abdominal pain, no fever, no flank pain, no nausea, no vaginal discharge and no vomiting    Past Medical History:  Diagnosis Date   Acid reflux    Allergy    Carpal tunnel syndrome    COPD (chronic obstructive pulmonary disease) (HCC)    Depression    Diverticulitis    Hyperlipidemia    Low back pain    Neck pain    Sacroiliac inflammation ([place])    Sciatica    Seasonal allergies     Patient Active Problem List   Diagnosis Date Noted   Centrilobular emphysema ([place]) 07/29/2020   Multiple nodules of lung 07/29/2020   Hyperlipidemia 07/18/2017   Ulnar neuropathy 04/18/2017   GERD (gastroesophageal reflux disease) 10/19/2016   Prediabetes 05/03/2016   Cubital tunnel syndrome on right 03/01/2016   Numbness and tingling of right arm 01/01/2016   Chlamydia infection 01/27/2015   Tobacco abuse 12/26/2013   Chronic lower back pain 04/13/2013   Sciatica 04/13/2013   Chronic neck pain 04/13/2013   Spondylosis of cervical spine 04/13/2013    Past Surgical History:  Procedure Laterality Date   ABDOMINAL SURGERY     ANKLE SURGERY     lt.   BREAST EXCISIONAL BIOPSY Right    pt states years ago- not sure when   PARTIAL HYSTERECTOMY     right breast cyst removed      URETHRAL DIVERTICULUM REPAIR     uretrral diverticultis    OB History     Gravida  1   Para      Term       Preterm      AB  1   Living         SAB  1   IAB      Ectopic      Multiple      Live Births               Home Medications    Prior to Admission medications   Medication Sig Start Date End Date Taking? Authorizing Provider  fluconazole (DIFLUCAN) 150 MG tablet Take 1 tablet (150 mg total) by mouth once a week. 02/18/21  Yes Carvel Getting, NP  miconazole (MICATIN) 2 % cream Apply 1 application topically 2 (two) times daily. 02/18/21  Yes Carvel Getting, NP  Acetaminophen-Codeine 300-30 MG tablet TAKE 1-2 TABLETS BY MOUTH EVERY 6 (SIX) HOURS AS NEEDED FOR UP TO 7 DAYS FOR MODERATE PAIN. 02/12/21   Jessy Oto, MD  albuterol (VENTOLIN HFA) 108 (90 Base) MCG/ACT inhaler Inhale 2 puffs into the lungs every 6 (six) hours as needed for wheezing or shortness of breath. 11/24/20   Magdalen Spatz, NP  amitriptyline (ELAVIL) 50 MG tablet TAKE 1 TABLET (50 MG TOTAL) BY MOUTH AT BEDTIME. 12/01/20 12/01/21  Charlott Rakes, MD  atorvastatin (  LIPITOR) 20 MG tablet TAKE 1 TABLET (20 MG TOTAL) BY MOUTH DAILY. 12/01/20 12/01/21  Charlott Rakes, MD  Azelastine HCl 137 MCG/SPRAY SOLN Place into both nostrils. 01/04/21   [provider]  budesonide-formoterol (SYMBICORT) 160-4.5 MCG/ACT inhaler Inhale 2 puffs into the lungs in the morning and at bedtime. 11/29/20   Magdalen Spatz, NP  cetirizine (ZYRTEC) 10 MG tablet Take 1 tablet (10 mg total) by mouth daily. 04/18/17   Charlott Rakes, MD  cyclobenzaprine (FLEXERIL) 10 MG tablet TAKE 1 TABLET (10 MG TOTAL) BY MOUTH 2 (TWO) TIMES DAILY AS NEEDED FOR MUSCLE SPASMS. 12/01/20 12/01/21  Charlott Rakes, MD  diclofenac sodium (VOLTAREN) 1 % GEL Apply 2 g topically 4 (four) times daily. 03/18/17   Jessy Oto, MD  DULoxetine (CYMBALTA) 60 MG capsule TAKE 1 CAPSULE (60 MG TOTAL) BY MOUTH DAILY. 12/01/20 12/01/21  Charlott Rakes, MD  ezetimibe (ZETIA) 10 MG tablet TAKE 1 TABLET (10 MG TOTAL) BY MOUTH DAILY AFTER SUPPER. 12/03/20 02/01/21   Adrian Prows, MD  fluticasone (FLONASE) 50 MCG/ACT nasal spray PLACE 2 SPRAYS INTO BOTH NOSTRILS DAILY. 01/27/21 01/27/22  Charlott Rakes, MD  Lactobacillus CAPS Take 1 capsule by mouth daily.    [provider]  montelukast (SINGULAIR) 10 MG tablet Take 10 mg by mouth daily. 11/25/20   [provider]  Multiple Vitamin (MULTIVITAMIN WITH MINERALS) TABS tablet Take 1 tablet by mouth daily.    [provider]  naproxen (NAPROSYN) 500 MG tablet TAKE 1 TABLET BY MOUTH TWO TIMES DAILY WITH MEALS 12/15/20 12/15/21  Charlott Rakes, MD  nicotine (NICODERM CQ) 21 mg/24hr patch Place 1 patch (21 mg total) onto the skin daily. 11/10/20   Adrian Prows, MD  oxybutynin (DITROPAN) 5 MG tablet TAKE 1 TABLET (5 MG TOTAL) BY MOUTH 2 (TWO) TIMES DAILY. 12/01/20 12/01/21  Charlott Rakes, MD  pregabalin (LYRICA) 75 MG capsule Take 1 capsule (75 mg total) by mouth 2 (two) times daily. 12/15/20 06/15/21  Charlott Rakes, MD  RESTASIS 0.05 % ophthalmic emulsion  04/13/17   [provider]  Tiotropium Bromide Monohydrate (SPIRIVA RESPIMAT) 2.5 MCG/ACT AERS Inhale 2 puffs into the lungs daily. 01/26/21   Magdalen Spatz, NP  triamcinolone cream (KENALOG) 0.1 % Apply 1 application topically 2 (two) times daily. 01/03/19   Charlott Rakes, MD    Family History Family History  Problem Relation Age of Onset   Healthy Mother    Other Father        Unsure of medical history   Asthma Maternal Aunt    Diabetes Maternal Aunt    Cancer Maternal Aunt    Breast cancer Maternal Aunt    Hypertension Maternal Grandmother    Heart Problems Maternal Grandmother    Hypertension Other    COPD Other    Colon cancer Neg Hx    Pancreatic cancer Neg Hx    Rectal cancer Neg Hx    Stomach cancer Neg Hx    Colon polyps Neg Hx    Esophageal cancer Neg Hx     Social History Social History   Tobacco Use   Smoking status: Some Days    Packs/day: 0.25    Years: 35.00    Pack years: 8.75    Types:  Cigarettes    Start date: 1981   Smokeless tobacco: Never   Tobacco comments:    Wears patches, 5 cigarettes smoked on some days especially the days that her uncle stress her with leaving the home.Some days  smokes none when her uncle is out of town and not in the home.  Vaping Use   Vaping Use: Never used  Substance Use Topics   Alcohol use: Not Currently    Comment: Social only - seldom   Drug use: No     Allergies   Percocet [oxycodone-acetaminophen], Vicodin [hydrocodone-acetaminophen], and Penicillins   Review of Systems Review of Systems  Constitutional:  Negative for chills and fever.  HENT:         White bumps on tongue, mouth/throat irritaiton  Gastrointestinal:  Negative for abdominal pain, nausea and vomiting.  Genitourinary:  Positive for dysuria. Negative for flank pain, frequency, hematuria, urgency, vaginal bleeding and vaginal discharge.       Vaginal irritation with urination    Physical Exam Triage Vital Signs ED Triage Vitals  Enc Vitals Group     BP 02/18/21 1006 (!) 116/54     Pulse Rate 02/18/21 1006 93     Resp 02/18/21 1006 16     Temp 02/18/21 1006 98.2 F (36.8 C)     Temp Source 02/18/21 1006 Oral     SpO2 02/18/21 1006 99 %     Weight --      Height --      Head Circumference --      Peak Flow --      Pain Score 02/18/21 1005 5     Pain Loc --      Pain Edu? --      Excl. in [place]? --    No data found.  Updated Vital Signs BP (!) 116/54 (BP Location: Left Arm)    Pulse 93    Temp 98.2 F (36.8 C) (Oral)    Resp 16    SpO2 99%   Visual Acuity Right Eye Distance:   Left Eye Distance:   Bilateral Distance:    Right Eye Near:   Left Eye Near:    Bilateral Near:     Physical Exam Constitutional:      Appearance: Normal appearance. She is not ill-appearing.  HENT:     Mouth/Throat:     Mouth: Mucous membranes are moist. Oral lesions present.     Pharynx: Oropharynx is clear.     Comments: Coating on tongue is white Pulmonary:      Effort: Pulmonary effort is normal.  Neurological:     Mental Status: She is alert and oriented to person, place, and time.     UC Treatments / Results  Labs (all labs ordered are listed, but only abnormal results are displayed) Labs Reviewed  POCT URINALYSIS DIPSTICK, ED / UC - Abnormal; Notable for the following components:      Result Value   Leukocytes,Ua SMALL (*)    All other components within normal limits    EKG   Radiology No results found.  Procedures Procedures (including critical care time)  Medications Ordered in UC Medications - No data to display  Initial Impression / Assessment and Plan / UC Course  I have reviewed the triage vital signs and the nursing notes.  Pertinent labs & imaging results that were available during my care of the patient were reviewed by me and considered in my medical decision making (see chart for details).    Pt not sexually active, if has vaginitis it is likely yeast. Will treat empirically without test given hx prediabetes, use of steroids (both inhaled and injections in back).   Final Clinical Impressions(s) / UC Diagnoses   Final  diagnoses:  Vaginal irritation     Discharge Instructions      Rinse your mouth after using symbicort.   Use the monistat (miconazole) cream externally to relieve irritation.    ED Prescriptions     Medication Sig Dispense Auth. Provider   fluconazole (DIFLUCAN) 150 MG tablet Take 1 tablet (150 mg total) by mouth once a week. 2 tablet Carvel Getting, NP   miconazole (MICATIN) 2 % cream Apply 1 application topically 2 (two) times daily. 28.35 g Carvel Getting, NP      PDMP not reviewed this encounter.   Carvel Getting, NP 02/18/21 1046

## 2021-02-18 NOTE — Discharge Instructions (Signed)
Rinse your mouth after using symbicort.   Use the monistat (miconazole) cream externally to relieve irritation.

## 2021-02-18 NOTE — ED Triage Notes (Signed)
Pt presents with c/o dysuria x 2 weeks and states the white bumps on her tongue started yesterday.

## 2021-02-19 ENCOUNTER — Encounter (HOSPITAL_COMMUNITY)
Admission: RE | Admit: 2021-02-19 | Discharge: 2021-02-19 | Disposition: A | Discharge status: Home / Self Care | Payer: Medicaid Other | Source: Ambulatory Visit | Attending: Pulmonary Disease | Admitting: Pulmonary Disease

## 2021-02-19 ENCOUNTER — Other Ambulatory Visit: Payer: Self-pay | Admitting: Family Medicine

## 2021-02-19 DIAGNOSIS — J449 Chronic obstructive pulmonary disease, unspecified: Secondary | ICD-10-CM | POA: Diagnosis not present

## 2021-02-19 DIAGNOSIS — R0982 Postnasal drip: Secondary | ICD-10-CM

## 2021-02-19 NOTE — Progress Notes (Signed)
Daily Session Note  Patient Details  Name: Kentley Cedillo MRN: [id number] Date of Birth: 1965-05-14 Referring Provider:   [name] Pulmonary Rehab Walk Test from 01/28/2021 in [place]  Referring Provider [name]       Encounter Date: 02/19/2021  Check In:  Session Check In - 02/19/21 1209       Check-In   Supervising physician immediately available to respond to emergencies [facility] immediately available    Physician(s) Dr. Doristine Bosworth    Location [facility]    Staff Present Esmeralda Links BS, ACSM EP-C, Exercise Physiologist;Tysha Grismore Ysidro Evert, RN;Carlette Carlton, RN, BSN    Virtual Visit No    Medication changes reported     No    Fall or balance concerns reported    No    Tobacco Cessation No Change    Warm-up and Cool-down Performed as group-led Higher education careers adviser Performed Yes    VAD Patient? No    PAD/SET Patient? No      Pain Assessment   Currently in Pain? No/denies    Multiple Pain Sites No             Capillary Blood Glucose: No results found for this or any previous visit (from the past 24 hour(s)).    Social History   Tobacco Use  Smoking Status Some Days   Packs/day: 0.25   Years: 35.00   Pack years: 8.75   Types: Cigarettes   Start date: 1981  Smokeless Tobacco Never  Tobacco Comments   Wears patches, 5 cigarettes smoked on some days especially the days that her uncle stress her with leaving the home.Some days smokes none when her uncle is out of town and not in the home.    Goals Met:  Exercise tolerated well No report of concerns or symptoms today Strength training completed today  Goals Unmet:  Not Applicable  Comments: Service time is from 1024 to 1140    Dr. Rodman Pickle is Medical Director for Pulmonary Rehab at [facility].

## 2021-02-19 NOTE — Telephone Encounter (Signed)
Requested medication (s) are due for refill today - yes  Requested medication (s) are on the active medication list -yes  Future visit scheduled -yes  Last refill: 01/27/21 16g  Notes to clinic: Request RF: non delegated Rx  Requested Prescriptions  Pending Prescriptions Disp Refills   fluticasone (FLONASE) 50 MCG/ACT nasal spray [Pharmacy Med Name: FLUTICASONE PROP 50 MCG SPRAY] 16 mL     Sig: SPRAY 2 SPRAYS INTO EACH NOSTRIL EVERY DAY     Not Delegated - Ear, Nose, and Throat: Nasal Preparations - Corticosteroids Failed - 02/19/2021 12:35 PM      Failed - This refill cannot be delegated      Passed - Valid encounter within last 12 months    Recent Outpatient Visits           2 weeks ago Need for shingles vaccine   [place], Jarome Matin, RPH-CPP   2 months ago Bilateral low back pain with bilateral sciatica, unspecified chronicity   [place], [place], MD   5 months ago Chronic tension-type headache, not intractable   [place], [place], MD   8 months ago Current mild episode of major depressive disorder without prior episode [facility])   [facility] Charlott Rakes, MD   1 year ago Tobacco abuse   [place], [place]ong, MD       Future Appointments             In 1 month [name], Leanna Sato, MD [facility]   In 1 month Louanne Skye, Daleen Bo, MD [place]   In 3 months Charlott Rakes, MD [place]               Requested Prescriptions  Pending Prescriptions Disp Refills   fluticasone (FLONASE) 50 MCG/ACT nasal spray [Pharmacy Med Name: FLUTICASONE PROP 50 MCG SPRAY] 16 mL     Sig: SPRAY 2 SPRAYS INTO EACH NOSTRIL EVERY DAY     Not Delegated - Ear, Nose, and Throat: Nasal Preparations - Corticosteroids Failed - 02/19/2021 12:35 PM       Failed - This refill cannot be delegated      Passed - Valid encounter within last 12 months    Recent Outpatient Visits           2 weeks ago Need for shingles vaccine   [place], Jarome Matin, RPH-CPP   2 months ago Bilateral low back pain with bilateral sciatica, unspecified chronicity   [place], [place], MD   5 months ago Chronic tension-type headache, not intractable   [place], [place], MD   8 months ago Current mild episode of major depressive disorder without prior episode [facility])   [place], [place]ong, MD   1 year ago Tobacco abuse   [place], Enobong, MD       Future Appointments             In 1 month [name], Leanna Sato, MD [facility]   In 1 month Louanne Skye, Daleen Bo, MD [place]   In 3 months Charlott Rakes, MD [place]

## 2021-02-24 ENCOUNTER — Encounter (HOSPITAL_COMMUNITY)
Admission: RE | Admit: 2021-02-24 | Discharge: 2021-02-24 | Disposition: A | Discharge status: Home / Self Care | Payer: Medicaid Other | Source: Ambulatory Visit | Attending: Pulmonary Disease | Admitting: Pulmonary Disease

## 2021-02-24 ENCOUNTER — Other Ambulatory Visit: Payer: Self-pay

## 2021-02-24 VITALS — Wt 168.7 lb

## 2021-02-24 DIAGNOSIS — J449 Chronic obstructive pulmonary disease, unspecified: Secondary | ICD-10-CM

## 2021-02-24 NOTE — Progress Notes (Signed)
Daily Session Note  Patient Details  Name: Cheryl King MRN: [id number] Date of Birth: 07/12/1965 Referring Provider:   [name] Pulmonary Rehab Walk Test from 01/28/2021 in [place]  Referring Provider [name]       Encounter Date: 02/24/2021  Check In:  Session Check In - 02/24/21 1148       Check-In   Supervising physician immediately available to respond to emergencies [facility] immediately available    Physician(s) Dr. Doristine Bosworth    [facility]    Staff Present Elmon Else, MS, ACSM-CEP, Exercise Physiologist;Lisa Ysidro Evert, RN;Carlette Wilber Oliphant, RN, BSN    Virtual Visit No    Medication changes reported     No    Fall or balance concerns reported    No    Tobacco Cessation No Change    Warm-up and Cool-down Performed as group-led instruction    Resistance Training Performed Yes    VAD Patient? No    PAD/SET Patient? No      Pain Assessment   Currently in Pain? No/denies    Multiple Pain Sites No             Capillary Blood Glucose: No results found for this or any previous visit (from the past 24 hour(s)).   Exercise Prescription Changes - 02/24/21 1200       Response to Exercise   Blood Pressure (Admit) 110/70    Blood Pressure (Exercise) 130/64    Blood Pressure (Exit) 109/72    Heart Rate (Admit) 80 bpm    Heart Rate (Exercise) 99 bpm    Heart Rate (Exit) 79 bpm    Oxygen Saturation (Admit) 96 %    Oxygen Saturation (Exercise) 93 %    Oxygen Saturation (Exit) 91 %    Rating of Perceived Exertion (Exercise) 11    Perceived Dyspnea (Exercise) 2    Duration Continue with 30 min of aerobic exercise without signs/symptoms of physical distress.    Intensity THRR unchanged      Progression   Progression Continue to progress workloads to maintain intensity without signs/symptoms of physical distress.      Resistance Training   Training Prescription Yes    Weight red bands    Reps  10-15    Time 10 Minutes      Oxygen   Oxygen Continuous    Liters 2      Recumbant Bike   Level 2    Minutes 15    METs 3      Track   Laps 9    Minutes 15    METs 2.05      Oxygen   Maintain Oxygen Saturation 88% or higher             Social History   Tobacco Use  Smoking Status Some Days   Packs/day: 0.25   Years: 35.00   Pack years: 8.75   Types: Cigarettes   Start date: 82  Smokeless Tobacco Never  Tobacco Comments   Wears patches, 5 cigarettes smoked on some days especially the days that her uncle stress her with leaving the home.Some days smokes none when her uncle is out of town and not in the home.    Goals Met:  Proper associated with RPD/PD & O2 Sat Independence with exercise equipment Exercise tolerated well No report of concerns or symptoms today Strength training completed today  Goals Unmet:  Not Applicable  Comments: Service time is from 1029  to 1145.    Dr. Rodman Pickle is Medical Director for Pulmonary Rehab at [facility].

## 2021-02-25 ENCOUNTER — Other Ambulatory Visit: Payer: Self-pay | Admitting: Cardiology

## 2021-02-25 ENCOUNTER — Other Ambulatory Visit: Payer: Self-pay | Admitting: Family Medicine

## 2021-02-25 DIAGNOSIS — R0982 Postnasal drip: Secondary | ICD-10-CM

## 2021-02-25 DIAGNOSIS — E7800 Pure hypercholesterolemia, unspecified: Secondary | ICD-10-CM

## 2021-02-26 ENCOUNTER — Other Ambulatory Visit: Payer: Self-pay

## 2021-02-26 ENCOUNTER — Encounter (HOSPITAL_COMMUNITY)
Admission: RE | Admit: 2021-02-26 | Discharge: 2021-02-26 | Disposition: A | Discharge status: Home / Self Care | Payer: Medicaid Other | Source: Ambulatory Visit | Attending: Pulmonary Disease | Admitting: Pulmonary Disease

## 2021-02-26 DIAGNOSIS — J449 Chronic obstructive pulmonary disease, unspecified: Secondary | ICD-10-CM

## 2021-02-26 MED ORDER — EZETIMIBE 10 MG PO TABS: 10.0000 mg | ORAL_TABLET | Freq: Every day | ORAL | 1 refills | Status: DC

## 2021-02-26 MED ORDER — FLUTICASONE PROPIONATE 50 MCG/ACT NA SUSP: 2.0000 | Freq: Every day | NASAL | 0 refills | Status: DC

## 2021-02-26 NOTE — Progress Notes (Signed)
Daily Session Note  Patient Details  Name: Cheryl King MRN: [id number] Date of Birth: [birth year] Referring Provider:   [name] Pulmonary Rehab Walk Test from 01/28/2021 in [employer]  Referring Provider [name]       Encounter Date: 02/26/2021  Check In:  Session Check In - 02/26/21 1123       Check-In   Supervising physician immediately available to respond to emergencies [facility] immediately available    Physician(s) Dr. Eliseo Squires    [facility]    Staff Present Elmon Else, MS, ACSM-CEP, Exercise Physiologist;Carlette Wilber Oliphant, RN, Roque Cash, RN    Virtual Visit No    Medication changes reported     No    Fall or balance concerns reported    No    Tobacco Cessation No Change    Warm-up and Cool-down Performed as group-led instruction    Resistance Training Performed Yes    VAD Patient? No    PAD/SET Patient? No      Pain Assessment   Currently in Pain? No/denies    Multiple Pain Sites No             Capillary Blood Glucose: No results found for this or any previous visit (from the past 24 hour(s)).    Social History   Tobacco Use  Smoking Status Some Days   Packs/day: 0.25   Years: 35.00   Pack years: 8.75   Types: Cigarettes   Start date: 1981  Smokeless Tobacco Never  Tobacco Comments   Wears patches, 5 cigarettes smoked on some days especially the days that her uncle stress her with leaving the home.Some days smokes none when her uncle is out of town and not in the home.    Goals Met:  Exercise tolerated well No report of concerns or symptoms today Strength training completed today  Goals Unmet:  Not Applicable  Comments: Service time is from 1024 to [place]    Dr. Rodman Pickle is Medical Director for Pulmonary Rehab at [facility].

## 2021-03-02 ENCOUNTER — Telehealth: Payer: Self-pay

## 2021-03-02 ENCOUNTER — Other Ambulatory Visit: Payer: Self-pay | Admitting: Family Medicine

## 2021-03-02 DIAGNOSIS — R0982 Postnasal drip: Secondary | ICD-10-CM

## 2021-03-02 NOTE — Telephone Encounter (Signed)
Pt called for an repeat of Bil L5 TF she had on 01/01/21, same pain and no new injury, please advise.

## 2021-03-03 ENCOUNTER — Other Ambulatory Visit: Payer: Self-pay

## 2021-03-03 ENCOUNTER — Encounter (HOSPITAL_COMMUNITY)
Admission: RE | Admit: 2021-03-03 | Discharge: 2021-03-03 | Disposition: A | Discharge status: Home / Self Care | Payer: Medicaid Other | Source: Ambulatory Visit | Attending: Pulmonary Disease | Admitting: Pulmonary Disease

## 2021-03-03 ENCOUNTER — Telehealth: Payer: Self-pay | Admitting: Pulmonary Disease

## 2021-03-03 DIAGNOSIS — J449 Chronic obstructive pulmonary disease, unspecified: Secondary | ICD-10-CM | POA: Diagnosis not present

## 2021-03-03 DIAGNOSIS — J432 Centrilobular emphysema: Secondary | ICD-10-CM

## 2021-03-03 NOTE — Telephone Encounter (Signed)
On her 6-minute walk today, she was noted to desaturate and required 2 L of oxygen  6 MWT with [place]. Her oxygen saturation dropped to 84% RA after 1 min and 19 sec. She was placed on 1L. Her oxygen saturation dropped to 84% 1L at the 5:40 mark and her O2 was increased to 2L.  Please send an order for 2 L of oxygen POC to [facility] and let patient know  Please schedule office visit with APP to assess in 1-2 weeks

## 2021-03-03 NOTE — Progress Notes (Signed)
Daily Session Note  Patient Details  Name: Cheryl King MRN: [id number] Date of Birth: 05/31/1965 Referring Provider:   [name] Pulmonary Rehab Walk Test from 01/28/2021 in [place]  Referring Provider [name]       Encounter Date: 03/03/2021  Check In:  Session Check In - 03/03/21 1127       Check-In   Supervising physician immediately available to respond to emergencies [facility] immediately available    Physician(s) Dr. Eliseo Squires    [facility]    Staff Present Maurice Small, RN, BSN;Adron Geisel Ysidro Evert, Cathleen Fears, MS, ACSM-CEP, Exercise Physiologist    Virtual Visit No    Medication changes reported     No    Fall or balance concerns reported    No    Tobacco Cessation No Change    Warm-up and Cool-down Performed as group-led instruction    Resistance Training Performed Yes    VAD Patient? No    PAD/SET Patient? No      Pain Assessment   Currently in Pain? No/denies    Multiple Pain Sites No             Capillary Blood Glucose: No results found for this or any previous visit (from the past 24 hour(s)).   Exercise Prescription Changes - 03/03/21 1200       Home Exercise Plan   Plans to continue exercise at [facility] (comment)   [employer]   Frequency Add 1 additional day to program exercise sessions.    Initial Home Exercises Provided 03/03/21             Social History   Tobacco Use  Smoking Status Some Days   Packs/day: 0.25   Years: 35.00   Pack years: 8.75   Types: Cigarettes   Start date: 1981  Smokeless Tobacco Never  Tobacco Comments   Wears patches, 5 cigarettes smoked on some days especially the days that her uncle stress her with leaving the home.Some days smokes none when her uncle is out of town and not in the home.    Goals Met:  Exercise tolerated well No report of concerns or symptoms today Strength training completed today  Goals Unmet:  Not  Applicable  Comments: Service time is from 1017 to 1135    Dr. Rodman Pickle is Medical Director for Pulmonary Rehab at [facility].

## 2021-03-03 NOTE — Progress Notes (Signed)
6 Minute Walk:   6 Minute Walk       Row Name 01/28/21 1211                   6 Minute Walk    Phase Initial        Distance 1028 feet        Walk Time 6 minutes        # of Rest Breaks 2  1:30-2:40, 5:40-6:00. Due to desat        MPH 1.95        METS 2.98        RPE 11        Perceived Dyspnea  3        VO2 Peak 10.42        Symptoms No        Resting HR 73 bpm        Resting BP 120/60        Resting Oxygen Saturation  94 %        Exercise Oxygen Saturation  during 6 min walk 84 %        Max Ex. HR 98 bpm        Max Ex. BP 120/60        2 Minute Post BP 116/60               Interval HR    1 Minute HR 85        2 Minute HR 88  1:19- 84        3 Minute HR 91        4 Minute HR 92        5 Minute HR 91  5:40- 98        6 Minute HR 90        2 Minute Post HR 67        Interval Heart Rate? Yes               Interval Oxygen    Interval Oxygen? Yes        Baseline Oxygen Saturation % 94 %        1 Minute Oxygen Saturation % 85 %        1 Minute Liters of Oxygen 0 L        2 Minute Oxygen Saturation % 84 %  84% @ 1:19        2 Minute Liters of Oxygen 0 L  Increased to 1L        3 Minute Oxygen Saturation % 88 %        3 Minute Liters of Oxygen 1 L        4 Minute Oxygen Saturation % 88 %        4 Minute Liters of Oxygen 1 L        5 Minute Oxygen Saturation % 86 %        5 Minute Liters of Oxygen 1 L        6 Minute Oxygen Saturation % 86 %  84% @ 5:40        6 Minute Liters of Oxygen 2 L  Increased to 2L        2 Minute Post Oxygen Saturation % 100 %  97% @ 10 min on RA        2 Minute Post Liters of Oxygen 2 L

## 2021-03-03 NOTE — Progress Notes (Signed)
Home Exercise Prescription I have reviewed a Home Exercise Prescription with Luna Fuse. Reyna is not currently exercising at home. I encouraged her to start exercising at home at least 1 non-rehab day/wk for 30 min/day. 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 She used to do exercise classes at the [facility]. Thanya mentioned that she wanted to get back into her exercise classes and walking the track. I reviewed the RPE and dyspnea scale with her. I recommended purchasing a pulse oximeter so that she can monitor her oxygen saturation. She agreed with my recommendations. The patient stated that their goals were to maintain current health and be aware of signs and sympotoms. We reviewed exercise guidelines, target heart rate during exercise, RPE Scale, weather conditions, endpoints for exercise, warmup and cool down. The patient is encouraged to come to me with any questions. I will continue to follow up with the patient to assist them with progression and safety.    Sheppard Plumber, MS, ACSM-CEP 03/03/2021 12:14 PM

## 2021-03-04 NOTE — Telephone Encounter (Signed)
Order for oxygen and POC machine placed. Called and spoke to patient. Patient scheduled to see TP in 1 week. Nothing further needed.

## 2021-03-05 ENCOUNTER — Telehealth: Payer: Self-pay

## 2021-03-05 ENCOUNTER — Other Ambulatory Visit: Payer: Self-pay

## 2021-03-05 ENCOUNTER — Telehealth: Payer: Self-pay | Admitting: Physical Medicine and Rehabilitation

## 2021-03-05 ENCOUNTER — Other Ambulatory Visit: Payer: Self-pay | Admitting: Family Medicine

## 2021-03-05 ENCOUNTER — Encounter (HOSPITAL_COMMUNITY)
Admission: RE | Admit: 2021-03-05 | Discharge: 2021-03-05 | Disposition: A | Discharge status: Home / Self Care | Payer: Medicaid Other | Source: Ambulatory Visit | Attending: Pulmonary Disease | Admitting: Pulmonary Disease

## 2021-03-05 ENCOUNTER — Encounter: Payer: Self-pay | Admitting: Family Medicine

## 2021-03-05 DIAGNOSIS — J449 Chronic obstructive pulmonary disease, unspecified: Secondary | ICD-10-CM

## 2021-03-05 MED ORDER — LIDOCAINE 5 % EX PTCH: 1.0000 | MEDICATED_PATCH | CUTANEOUS | 3 refills | Status: DC

## 2021-03-05 NOTE — Telephone Encounter (Signed)
PA APPROVED UNTIL 03/05/2022. PHARMACY NOTIFIED.

## 2021-03-05 NOTE — Telephone Encounter (Signed)
Patient returned call asked for a call back    phone # [phone]

## 2021-03-05 NOTE — Telephone Encounter (Signed)
PA for Lidocaine 5% patch submitted to patients ins today.  Waiting on response.

## 2021-03-05 NOTE — Progress Notes (Signed)
Daily Session Note  Patient Details  Name: Cheryl King MRN: [id number] Date of Birth: 16-Dec-1965 Referring Provider:   April King Pulmonary Rehab Walk Test from 01/28/2021 in [place]  Referring Provider Cheryl King       Encounter Date: 03/05/2021  Check In:  Session Check In - 03/05/21 1121       Check-In   Supervising physician immediately available to respond to emergencies [facility] immediately available    Physician(s) dr. Cruzita Lederer    [facility]    Staff Present Cheryl Poles, RN, BSN;Cheryl Dredge, RN, Cheryl Bras, MS, ACSM-CEP, Exercise Physiologist    Virtual Visit No    Medication changes reported     No    Fall or balance concerns reported    No    Tobacco Cessation No Change    Warm-up and Cool-down Performed as group-led instruction    Resistance Training Performed Yes    VAD Patient? No    PAD/SET Patient? No      Pain Assessment   Currently in Pain? No/denies    Multiple Pain Sites No             Capillary Blood Glucose: No results found for this or any previous visit (from the past 24 hour(s)).    Social History   Tobacco Use  Smoking Status Some Days   Packs/day: 0.25   Years: 35.00   Pack years: 8.75   Types: Cigarettes   Start date: 1981  Smokeless Tobacco Never  Tobacco Comments   Wears patches, 5 cigarettes smoked on some days especially the days that her uncle stress her with leaving the home.Some days smokes none when her uncle is out of town and not in the home.    Goals Met:  Proper associated with RPD/PD & O2 Sat Exercise tolerated well No report of concerns or symptoms today Strength training completed today  Goals Unmet:  Not Applicable  Comments: Service time is from 1021 to 1138.    Dr. Rodman Pickle is Medical Director for Pulmonary Rehab at [facility].

## 2021-03-06 DIAGNOSIS — J432 Centrilobular emphysema: Secondary | ICD-10-CM | POA: Diagnosis not present

## 2021-03-10 ENCOUNTER — Other Ambulatory Visit: Payer: Self-pay

## 2021-03-10 ENCOUNTER — Encounter (HOSPITAL_COMMUNITY)
Admission: RE | Admit: 2021-03-10 | Discharge: 2021-03-10 | Disposition: A | Discharge status: Home / Self Care | Payer: Medicaid Other | Source: Ambulatory Visit | Attending: Pulmonary Disease | Admitting: Pulmonary Disease

## 2021-03-10 VITALS — Wt 166.0 lb

## 2021-03-10 DIAGNOSIS — J449 Chronic obstructive pulmonary disease, unspecified: Secondary | ICD-10-CM | POA: Diagnosis not present

## 2021-03-10 NOTE — Progress Notes (Signed)
Daily Session Note  Patient Details  Name: Cheryl King MRN: [id number] Date of Birth: 11/05/1965 Referring Provider:   [name] Pulmonary Rehab Walk Test from 01/28/2021 in [place]  Referring Provider [name]       Encounter Date: 03/10/2021  Check In:  Session Check In - 03/10/21 1126       Check-In   Supervising physician immediately available to respond to emergencies [facility] immediately available    Physician(s) Dr. Doristine Bosworth    [facility]    Staff Present Rosebud Poles, RN, BSN;Carlette Wilber Oliphant, RN, Quentin Ore, MS, ACSM-CEP, Exercise Physiologist;Lisa Ysidro Evert, RN    Virtual Visit No    Medication changes reported     No    Fall or balance concerns reported    No    Tobacco Cessation No Change    Warm-up and Cool-down Performed as group-led instruction    Resistance Training Performed Yes    VAD Patient? No    PAD/SET Patient? No      Pain Assessment   Currently in Pain? No/denies    Multiple Pain Sites No             Capillary Blood Glucose: No results found for this or any previous visit (from the past 24 hour(s)).   Exercise Prescription Changes - 03/10/21 1300       Response to Exercise   Blood Pressure (Admit) 120/70    Blood Pressure (Exercise) 138/68    Blood Pressure (Exit) 108/68    Heart Rate (Admit) 79 bpm    Heart Rate (Exercise) 103 bpm    Heart Rate (Exit) 80 bpm    Oxygen Saturation (Admit) 95 %    Oxygen Saturation (Exercise) 93 %    Oxygen Saturation (Exit) 90 %    Rating of Perceived Exertion (Exercise) 11    Perceived Dyspnea (Exercise) 1    Duration Continue with 30 min of aerobic exercise without signs/symptoms of physical distress.    Intensity THRR unchanged      Progression   Progression Continue to progress workloads to maintain intensity without signs/symptoms of physical distress.      Resistance Training   Training Prescription Yes     Weight blue bands    Reps 10-15    Time 10 Minutes      Oxygen   Oxygen Continuous    Liters 2      Recumbant Bike   Level 3    Minutes 15    METs 4      Track   Laps 16    Minutes 15    METs 2.86      Oxygen   Maintain Oxygen Saturation 88% or higher             Social History   Tobacco Use  Smoking Status Some Days   Packs/day: 0.25   Years: 35.00   Pack years: 8.75   Types: Cigarettes   Start date: 35  Smokeless Tobacco Never  Tobacco Comments   Wears patches, 5 cigarettes smoked on some days especially the days that her uncle stress her with leaving the home.Some days smokes none when her uncle is out of town and not in the home.    Goals Met:  Proper associated with RPD/PD & O2 Sat Independence with exercise equipment Exercise tolerated well No report of concerns or symptoms today Strength training completed today  Goals Unmet:  Not Applicable  Comments: Service time  is from 1025 to 1139.    Dr. Rodman Pickle is Medical Director for Pulmonary Rehab at [facility].

## 2021-03-11 ENCOUNTER — Encounter: Payer: Self-pay | Admitting: Adult Health

## 2021-03-11 ENCOUNTER — Ambulatory Visit: Payer: Medicaid Other | Admitting: Adult Health

## 2021-03-11 DIAGNOSIS — F17210 Nicotine dependence, cigarettes, uncomplicated: Secondary | ICD-10-CM

## 2021-03-11 DIAGNOSIS — J9611 Chronic respiratory failure with hypoxia: Secondary | ICD-10-CM

## 2021-03-11 DIAGNOSIS — Z720 Tobacco use: Secondary | ICD-10-CM

## 2021-03-11 DIAGNOSIS — J432 Centrilobular emphysema: Secondary | ICD-10-CM | POA: Diagnosis not present

## 2021-03-11 NOTE — Assessment & Plan Note (Signed)
Exertional hypoxemia - needs to wear Oxygen with activity   Plan  Patient Instructions  Continue on Symbicort and Spiriva . Rinse after use.  Activity as tolerated  Wear Oxygen 2lm with activity  Check overnight oximetry test on room air.  Work on not smoking .  Follow up with Dr. [name]  next month and As needed   Please contact office for sooner follow up if symptoms do not improve or worsen or seek emergency care

## 2021-03-11 NOTE — Assessment & Plan Note (Signed)
Smoking cessation discussed 

## 2021-03-11 NOTE — Assessment & Plan Note (Signed)
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  Patient is encouraged on smoking cessation  Plan  Patient Instructions  Continue on Symbicort and Spiriva . Rinse after use.  Activity as tolerated  Wear Oxygen 2lm with activity  Check overnight oximetry test on room air.  Work on not smoking .  Follow up with Dr. [name]  next month and As needed   Please contact office for sooner follow up if symptoms do not improve or worsen or seek emergency care

## 2021-03-11 NOTE — Progress Notes (Signed)
@Patient  ID: Cheryl King, female    DOB: 09/19/1965, 56 y.o.   MRN: [id number]  Chief Complaint  Patient presents with   Follow-up    Referring provider: Charlott Rakes, MD  HPI: 56 year old female active smoker followed for severe COPD with emphysema  TEST/EVENTS :   03/11/2021 Follow up: COPD , O2 RF  Patient presents for a 6-week follow-up.  Patient was seen last visit and continued on Symbicort and Spiriva.  And referred to pulmonary rehab.  During exercise patient was noted to have drops in her oxygen levels.  She was set up for a 6-minute walk test that was completed on March 03, 2021.  This showed that patient desatted down to 84% on room air.  She required oxygen at 2 L via POC to maintain O2 saturations greater than 88 to 90%.  Patient says she was set up for an oxygen concentrator however she has not worn it yet.  She did not understand that she was supposed to be wearing it with walking. Today in the office patient does desaturate with ambulation and requires oxygen at 2 L to keep O2 saturations greater than 88 to 90%.  We discussed the potential complications of hypoxemia.  She says overall she has had no flare of cough or wheezing.  She remains on her Symbicort and Spiriva with no increased albuterol use.  She is starting pulmonary rehab and seems to like it thus far.   Allergies  Allergen Reactions   Percocet [Oxycodone-Acetaminophen] Nausea Only   Vicodin [Hydrocodone-Acetaminophen] Nausea Only   Penicillins Rash    Immunization History  Administered Date(s) Administered   Fluad Quad(high Dose 65+) 10/14/2020   Influenza,inj,Quad PF,6+ Mos 10/09/2013, 10/03/2015, 10/18/2017, 09/20/2018, 10/23/2019   Influenza-Unspecified 10/19/2016   PFIZER Comirnaty(Gray Top)Covid-19 Tri-Sucrose Vaccine 10/23/2020   PFIZER(Purple Top)SARS-COV-2 Vaccination 11/13/2020   PNEUMOCOCCAL CONJUGATE-20 10/23/2020   Tdap 08/22/2015   Zoster Recombinat (Shingrix) 12/01/2020,  02/02/2021    Past Medical History:  Diagnosis Date   Acid reflux    Allergy    Carpal tunnel syndrome    COPD (chronic obstructive pulmonary disease) (HCC)    Depression    Diverticulitis    Hyperlipidemia    Low back pain    Neck pain    Sacroiliac inflammation (HCC)    Sciatica    Seasonal allergies     Tobacco History: Social History   Tobacco Use  Smoking Status Some Days   Packs/day: 0.25   Years: 35.00   Pack years: 8.75   Types: Cigarettes   Start date: 1981  Smokeless Tobacco Never  Tobacco Comments   Wears patches, 5 cigarettes smoked on some days especially the days that her uncle stress her with leaving the home.Some days smokes none when her uncle is out of town and not in the home.   Ready to quit: Not Answered Counseling given: Not Answered Tobacco comments: Wears patches, 5 cigarettes smoked on some days especially the days that her uncle stress her with leaving the home.Some days smokes none when her uncle is out of town and not in the home.   Outpatient Medications Prior to Visit  Medication Sig Dispense Refill   Acetaminophen-Codeine 300-30 MG tablet TAKE 1-2 TABLETS BY MOUTH EVERY 6 (SIX) HOURS AS NEEDED FOR UP TO 7 DAYS FOR MODERATE PAIN. 40 tablet 0   albuterol (VENTOLIN HFA) 108 (90 Base) MCG/ACT inhaler Inhale 2 puffs into the lungs every 6 (six) hours as needed for wheezing or shortness of breath.  18 g 5   amitriptyline (ELAVIL) 50 MG tablet TAKE 1 TABLET (50 MG TOTAL) BY MOUTH AT BEDTIME. 90 tablet 1   atorvastatin (LIPITOR) 20 MG tablet TAKE 1 TABLET (20 MG TOTAL) BY MOUTH DAILY. 90 tablet 1   Azelastine HCl 137 MCG/SPRAY SOLN Place into both nostrils.     budesonide-formoterol (SYMBICORT) 160-4.5 MCG/ACT inhaler Inhale 2 puffs into the lungs in the morning and at bedtime. 10.2 g 3   cetirizine (ZYRTEC) 10 MG tablet Take 1 tablet (10 mg total) by mouth daily. 90 tablet 1   cyclobenzaprine (FLEXERIL) 10 MG tablet TAKE 1 TABLET (10 MG TOTAL) BY  MOUTH 2 (TWO) TIMES DAILY AS NEEDED FOR MUSCLE SPASMS. 180 tablet 1   diclofenac sodium (VOLTAREN) 1 % GEL Apply 2 g topically 4 (four) times daily. 5 Tube 2   DULoxetine (CYMBALTA) 60 MG capsule TAKE 1 CAPSULE (60 MG TOTAL) BY MOUTH DAILY. 90 capsule 1   ezetimibe (ZETIA) 10 MG tablet Take 1 tablet (10 mg total) by mouth daily after supper. 90 tablet 1   fluconazole (DIFLUCAN) 150 MG tablet Take 1 tablet (150 mg total) by mouth once a week. 2 tablet 0   fluticasone (FLONASE) 50 MCG/ACT nasal spray SPRAY 2 SPRAYS INTO EACH NOSTRIL EVERY DAY 16 mL 2   Lactobacillus CAPS Take 1 capsule by mouth daily.     lidocaine (LIDODERM) 5 % Place 1 patch onto the skin daily. Remove & Discard patch within 12 hours or as directed by MD 30 patch 3   miconazole (MICATIN) 2 % cream Apply 1 application topically 2 (two) times daily. 28.35 g 0   montelukast (SINGULAIR) 10 MG tablet Take 10 mg by mouth daily.     Multiple Vitamin (MULTIVITAMIN WITH MINERALS) TABS tablet Take 1 tablet by mouth daily.     naproxen (NAPROSYN) 500 MG tablet TAKE 1 TABLET BY MOUTH TWO TIMES DAILY WITH MEALS 60 tablet 3   nicotine (NICODERM CQ) 21 mg/24hr patch Place 1 patch (21 mg total) onto the skin daily. 28 patch 0   oxybutynin (DITROPAN) 5 MG tablet TAKE 1 TABLET (5 MG TOTAL) BY MOUTH 2 (TWO) TIMES DAILY. 180 tablet 1   pregabalin (LYRICA) 75 MG capsule Take 1 capsule (75 mg total) by mouth 2 (two) times daily. 60 capsule 5   RESTASIS 0.05 % ophthalmic emulsion   3   Tiotropium Bromide Monohydrate (SPIRIVA RESPIMAT) 2.5 MCG/ACT AERS Inhale 2 puffs into the lungs daily. 1 each 12   triamcinolone cream (KENALOG) 0.1 % Apply 1 application topically 2 (two) times daily. 45 g 1   Facility-Administered Medications Prior to Visit  Medication Dose Route Frequency Provider Last Rate Last Admin   0.9 %  sodium chloride infusion  500 mL Intravenous Once Nelida Meuse III, MD         Review of Systems:   Constitutional:   No  weight  loss, night sweats,  Fevers, chills, + fatigue, or  lassitude.  HEENT:   No headaches,  Difficulty swallowing,  Tooth/dental problems, or  Sore throat,                No sneezing, itching, ear ache, nasal congestion, post nasal drip,   CV:  No chest pain,  Orthopnea, PND, swelling in lower extremities, anasarca, dizziness, palpitations, syncope.   GI  No heartburn, indigestion, abdominal pain, nausea, vomiting, diarrhea, change in bowel habits, loss of appetite, bloody stools.   Resp:  No chest wall deformity  Skin: no rash or lesions.  GU: no dysuria, change in color of urine, no urgency or frequency.  No flank pain, no hematuria   MS:  No joint pain or swelling.  No decreased range of motion.  No back pain.    Physical Exam  BP 110/60 (BP Location: Left Arm, Cuff Size: Normal)    Pulse 67    Temp 98.4 F (36.9 C) (Temporal)    Ht 5' 4.5" (1.638 m)    Wt 166 lb 3.2 oz (75.4 kg)    SpO2 92%    BMI 28.09 kg/m   GEN: A/Ox3; pleasant , NAD, well nourished    HEENT:  [place]/AT,  NOSE-clear, THROAT-clear, no lesions, no postnasal drip or exudate noted.   NECK:  Supple w/ fair ROM; no JVD; normal carotid impulses w/o bruits; no thyromegaly or nodules palpated; no lymphadenopathy.    RESP  Clear  P & A; w/o, wheezes/ rales/ or rhonchi. no accessory muscle use, no dullness to percussion  CARD:  RRR, no m/r/g, no peripheral edema, pulses intact, no cyanosis or clubbing.  GI:   Soft & nt; nml bowel sounds; no organomegaly or masses detected.   Musco: Warm bil, no deformities or joint swelling noted.   Neuro: alert, no focal deficits noted.    Skin: Warm, no lesions or rashes    Lab Results:   BMET   BNP No results found for: BNP  ProBNP No results found for: PROBNP  Imaging: No results found.    PFT Results Latest Ref Rng & Units 09/29/2020  FVC-Pre L 1.93  FVC-Predicted Pre % 67  FVC-Post L 2.10  FVC-Predicted Post % 73  Pre FEV1/FVC % % 52  Post FEV1/FCV % % 54   FEV1-Pre L 1.01  FEV1-Predicted Pre % 44  FEV1-Post L 1.14  DLCO uncorrected ml/min/mmHg 7.02  DLCO UNC% % 33  DLCO corrected ml/min/mmHg 7.02  DLCO COR %Predicted % 33  DLVA Predicted % 40  TLC L 5.32  TLC % Predicted % 102  RV % Predicted % 155    No results found for: NITRICOXIDE      Assessment & Plan:   Centrilobular emphysema (HCC) Severe COPD with emphysema-continue on current regimen.  Patient is encouraged on smoking cessation  Plan  Patient Instructions  Continue on Symbicort and Spiriva . Rinse after use.  Activity as tolerated  Wear Oxygen 2lm with activity  Check overnight oximetry test on room air.  Work on not smoking .  Follow up with Dr. [name]  next month and As needed   Please contact office for sooner follow up if symptoms do not improve or worsen or seek emergency care         Chronic respiratory failure with hypoxia ([place]) Exertional hypoxemia - needs to wear Oxygen with activity   Plan  Patient Instructions  Continue on Symbicort and Spiriva . Rinse after use.  Activity as tolerated  Wear Oxygen 2lm with activity  Check overnight oximetry test on room air.  Work on not smoking .  Follow up with Dr. [name]  next month and As needed   Please contact office for sooner follow up if symptoms do not improve or worsen or seek emergency care         Tobacco abuse Smoking cessation discussed      Rexene Edison, NP 03/11/2021

## 2021-03-11 NOTE — Patient Instructions (Addendum)
Continue on Symbicort and Spiriva . Rinse after use.  Activity as tolerated  Wear Oxygen 2lm with activity  Check overnight oximetry test on room air.  Work on not smoking .  Follow up with Dr. [name]  next month and As needed   Please contact office for sooner follow up if symptoms do not improve or worsen or seek emergency care

## 2021-03-12 ENCOUNTER — Encounter (HOSPITAL_COMMUNITY)
Admission: RE | Admit: 2021-03-12 | Discharge: 2021-03-12 | Disposition: A | Discharge status: Home / Self Care | Payer: Medicaid Other | Source: Ambulatory Visit | Attending: Pulmonary Disease | Admitting: Pulmonary Disease

## 2021-03-12 ENCOUNTER — Other Ambulatory Visit: Payer: Self-pay

## 2021-03-12 DIAGNOSIS — J449 Chronic obstructive pulmonary disease, unspecified: Secondary | ICD-10-CM

## 2021-03-12 NOTE — Progress Notes (Signed)
Daily Session Note  Patient Details  Name: Cheryl King MRN: [id number] Date of Birth: Mar 31, 1965 Referring Provider:   [name] Pulmonary Rehab Walk Test from 01/28/2021 in [place]  Referring Provider [name]       Encounter Date: 03/12/2021  Check In:  Session Check In - 03/12/21 1130       Check-In   Supervising physician immediately available to respond to emergencies [facility] immediately available    Physician(s) Dr. Eliseo Squires    Location [facility]    Staff Present Rodney Langton, RN;Other;Amaris Garrette Rosana Hoes, MS, ACSM-CEP, Exercise Physiologist    Virtual Visit No    Medication changes reported     No    Fall or balance concerns reported    No    Tobacco Cessation No Change    Warm-up and Cool-down Performed as group-led instruction    Resistance Training Performed Yes    VAD Patient? No    PAD/SET Patient? No      Pain Assessment   Currently in Pain? No/denies    Multiple Pain Sites No             Capillary Blood Glucose: No results found for this or any previous visit (from the past 24 hour(s)).    Social History   Tobacco Use  Smoking Status Some Days   Packs/day: 0.25   Years: 35.00   Pack years: 8.75   Types: Cigarettes   Start date: 1981  Smokeless Tobacco Never  Tobacco Comments   Wears patches, 5 cigarettes smoked on some days especially the days that her uncle stress her with leaving the home.Some days smokes none when her uncle is out of town and not in the home.    Goals Met:  Proper associated with RPD/PD & O2 Sat Independence with exercise equipment Exercise tolerated well No report of concerns or symptoms today Strength training completed today  Goals Unmet:  Not Applicable  Comments: Service time is from 1015 to 1143.    Dr. Rodman Pickle is Medical Director for Pulmonary Rehab at [facility].

## 2021-03-17 ENCOUNTER — Other Ambulatory Visit: Payer: Self-pay

## 2021-03-17 ENCOUNTER — Encounter (HOSPITAL_COMMUNITY)
Admission: RE | Admit: 2021-03-17 | Discharge: 2021-03-17 | Disposition: A | Discharge status: Home / Self Care | Payer: Medicaid Other | Source: Ambulatory Visit | Attending: Pulmonary Disease | Admitting: Pulmonary Disease

## 2021-03-17 DIAGNOSIS — J449 Chronic obstructive pulmonary disease, unspecified: Secondary | ICD-10-CM | POA: Diagnosis not present

## 2021-03-17 NOTE — Progress Notes (Signed)
Daily Session Note  Patient Details  Name: Cheryl King MRN: [id number] Date of Birth: August 05, 1965 Referring Provider:   April King Pulmonary Rehab Walk Test from 01/28/2021 in [place]  Referring Provider Cheryl King       Encounter Date: 03/17/2021  Check In:  Session Check In - 03/17/21 1120       Check-In   Supervising physician immediately available to respond to emergencies [facility] immediately available    Physician(s) Dr. Eliseo King    Location [facility]    Staff Present Cheryl Poles, RN, BSN;Cheryl Wilber Oliphant, RN, Cheryl Ore, MS, ACSM-CEP, Exercise Physiologist;Cheryl Ysidro Evert, RN    Virtual Visit No    Medication changes reported     No    Fall or balance concerns reported    No    Tobacco Cessation No Change    Warm-up and Cool-down Performed as group-led instruction    Resistance Training Performed Yes    VAD Patient? No    PAD/SET Patient? No      Pain Assessment   Currently in Pain? No/denies    Multiple Pain Sites No             Capillary Blood Glucose: No results found for this or any previous visit (from the past 24 hour(s)).    Social History   Tobacco Use  Smoking Status Some Days   Packs/day: 0.25   Years: 35.00   Pack years: 8.75   Types: Cigarettes   Start date: 1981  Smokeless Tobacco Never  Tobacco Comments   Wears patches, 5 cigarettes smoked on some days especially the days that her uncle stress her with leaving the home.Some days smokes none when her uncle is out of town and not in the home.    Goals Met:  Exercise tolerated well No report of concerns or symptoms today Strength training completed today  Goals Unmet:  Not Applicable  Comments: Service time is from 1012 to 1140    Dr. Rodman King is Medical Director for Pulmonary Rehab at [facility].

## 2021-03-18 DIAGNOSIS — Z800 Family history of malignant neoplasm of digestive organs: Secondary | ICD-10-CM | POA: Diagnosis not present

## 2021-03-18 DIAGNOSIS — K219 Gastro-esophageal reflux disease without esophagitis: Secondary | ICD-10-CM | POA: Diagnosis not present

## 2021-03-18 NOTE — Progress Notes (Signed)
Pulmonary Individual Treatment Plan  Patient Details  Name: Cheryl King MRN: [id number] Date of Birth: April 13, 1965 Referring Provider:   [name] Pulmonary Rehab Walk Test from 01/28/2021 in [place]  Referring Provider [name]       Initial Encounter Date:  Flowsheet Row Pulmonary Rehab Walk Test from 01/28/2021 in [place]  Date 01/28/21       Visit Diagnosis: COPD, severe ([place])  Patient's Home Medications on Admission:   Current Outpatient Medications:    Acetaminophen-Codeine 300-30 MG tablet, TAKE 1-2 TABLETS BY MOUTH EVERY 6 (SIX) HOURS AS NEEDED FOR UP TO 7 DAYS FOR MODERATE PAIN., Disp: 40 tablet, Rfl: 0   albuterol (VENTOLIN HFA) 108 (90 Base) MCG/ACT inhaler, Inhale 2 puffs into the lungs every 6 (six) hours as needed for wheezing or shortness of breath., Disp: 18 g, Rfl: 5   amitriptyline (ELAVIL) 50 MG tablet, TAKE 1 TABLET (50 MG TOTAL) BY MOUTH AT BEDTIME., Disp: 90 tablet, Rfl: 1   atorvastatin (LIPITOR) 20 MG tablet, TAKE 1 TABLET (20 MG TOTAL) BY MOUTH DAILY., Disp: 90 tablet, Rfl: 1   Azelastine HCl 137 MCG/SPRAY SOLN, Place into both nostrils., Disp: , Rfl:    budesonide-formoterol (SYMBICORT) 160-4.5 MCG/ACT inhaler, Inhale 2 puffs into the lungs in the morning and at bedtime., Disp: 10.2 g, Rfl: 3   cetirizine (ZYRTEC) 10 MG tablet, Take 1 tablet (10 mg total) by mouth daily., Disp: 90 tablet, Rfl: 1   cyclobenzaprine (FLEXERIL) 10 MG tablet, TAKE 1 TABLET (10 MG TOTAL) BY MOUTH 2 (TWO) TIMES DAILY AS NEEDED FOR MUSCLE SPASMS., Disp: 180 tablet, Rfl: 1   diclofenac sodium (VOLTAREN) 1 % GEL, Apply 2 g topically 4 (four) times daily., Disp: 5 Tube, Rfl: 2   DULoxetine (CYMBALTA) 60 MG capsule, TAKE 1 CAPSULE (60 MG TOTAL) BY MOUTH DAILY., Disp: 90 capsule, Rfl: 1   ezetimibe (ZETIA) 10 MG tablet, Take 1 tablet (10 mg total) by mouth daily after supper., Disp: 90 tablet, Rfl: 1   fluconazole  (DIFLUCAN) 150 MG tablet, Take 1 tablet (150 mg total) by mouth once a week., Disp: 2 tablet, Rfl: 0   fluticasone (FLONASE) 50 MCG/ACT nasal spray, SPRAY 2 SPRAYS INTO EACH NOSTRIL EVERY DAY, Disp: 16 mL, Rfl: 2   Lactobacillus CAPS, Take 1 capsule by mouth daily., Disp: , Rfl:    lidocaine (LIDODERM) 5 %, Place 1 patch onto the skin daily. Remove & Discard patch within 12 hours or as directed by MD, Disp: 30 patch, Rfl: 3   miconazole (MICATIN) 2 % cream, Apply 1 application topically 2 (two) times daily., Disp: 28.35 g, Rfl: 0   montelukast (SINGULAIR) 10 MG tablet, Take 10 mg by mouth daily., Disp: , Rfl:    Multiple Vitamin (MULTIVITAMIN WITH MINERALS) TABS tablet, Take 1 tablet by mouth daily., Disp: , Rfl:    naproxen (NAPROSYN) 500 MG tablet, TAKE 1 TABLET BY MOUTH TWO TIMES DAILY WITH MEALS, Disp: 60 tablet, Rfl: 3   nicotine (NICODERM CQ) 21 mg/24hr patch, Place 1 patch (21 mg total) onto the skin daily., Disp: 28 patch, Rfl: 0   oxybutynin (DITROPAN) 5 MG tablet, TAKE 1 TABLET (5 MG TOTAL) BY MOUTH 2 (TWO) TIMES DAILY., Disp: 180 tablet, Rfl: 1   pregabalin (LYRICA) 75 MG capsule, Take 1 capsule (75 mg total) by mouth 2 (two) times daily., Disp: 60 capsule, Rfl: 5   RESTASIS 0.05 % ophthalmic emulsion, , Disp: , Rfl:  3   Tiotropium Bromide Monohydrate (SPIRIVA RESPIMAT) 2.5 MCG/ACT AERS, Inhale 2 puffs into the lungs daily., Disp: 1 each, Rfl: 12   triamcinolone cream (KENALOG) 0.1 %, Apply 1 application topically 2 (two) times daily., Disp: 45 g, Rfl: 1  Current Facility-Administered Medications:    0.9 %  sodium chloride infusion, 500 mL, Intravenous, Once, Danis, Kirke Corin, MD  Past Medical History: Past Medical History:  Diagnosis Date   Acid reflux    Allergy    Carpal tunnel syndrome    COPD (chronic obstructive pulmonary disease) (HCC)    Depression    Diverticulitis    Hyperlipidemia    Low back pain    Neck pain    Sacroiliac inflammation (HCC)    Sciatica     Seasonal allergies     Tobacco Use: Social History   Tobacco Use  Smoking Status Some Days   Packs/day: 0.25   Years: 35.00   Pack years: 8.75   Types: Cigarettes   Start date: 1  Smokeless Tobacco Never  Tobacco Comments   Wears patches, 5 cigarettes smoked on some days especially the days that her uncle stress her with leaving the home.Some days smokes none when her uncle is out of town and not in the home.    Labs: Recent Review Flowsheet Data     Labs for ITP Cardiac and Pulmonary Rehab Latest Ref Rng & Units 09/20/2018 07/04/2019 01/28/2020 09/30/2020 12/01/2020   Cholestrol 100 - 199 mg/dL 202(H) 192 191 197 146   LDLCALC 0 - 99 mg/dL 120(H) 116(H) 117(H) 120(H) 63   HDL >39 mg/dL 67 64 60 63 70   Trlycerides 0 - 149 mg/dL 83 65 74 80 65   Hemoglobin A1c 0.0 - 7.0 % 5.6 5.6(A) - - 5.9       Capillary Blood Glucose: Lab Results  Component Value Date   GLUCAP 79 11/10/2011     Pulmonary Assessment Scores:  Pulmonary Assessment Scores     Row Name 01/28/21 1205         ADL UCSD   SOB Score total 48       CAT Score   CAT Score 26       mMRC Score   mMRC Score 4             UCSD: Self-administered rating of dyspnea associated with activities of daily living (ADLs) 6-point scale (0 = "not at all" to 5 = "maximal or unable to do because of breathlessness")  Scoring Scores range from 0 to 120.  Minimally important difference is 5 units  CAT: CAT can identify the health impairment of COPD patients and is better correlated with disease progression.  CAT has a scoring range of zero to 40. The CAT score is classified into four groups of low (less than 10), medium (10 - 20), high (21-30) and very high (31-40) based on the impact level of disease on health status. A CAT score over 10 suggests significant symptoms.  A worsening CAT score could be explained by an exacerbation, poor medication adherence, poor inhaler technique, or progression of COPD or comorbid  conditions.  CAT MCID is 2 points  mMRC: mMRC (Modified Medical Research Council) Dyspnea Scale is used to assess the degree of baseline functional disability in patients of respiratory disease due to dyspnea. No minimal important difference is established. A decrease in score of 1 point or greater is considered a positive change.   Pulmonary Function Assessment:  Pulmonary  Function Assessment - 01/28/21 1057       Breath   Bilateral Breath Sounds Clear    Shortness of Breath Fear of Shortness of Breath;Limiting activity;Panic with Shortness of Breath             Exercise Target Goals: Exercise Program Goal: Individual exercise prescription set using results from initial 6 min walk test and THRR while considering  patients activity barriers and safety.   Exercise Prescription Goal: Initial exercise prescription builds to 30-45 minutes a day of aerobic activity, 2-3 days per week.  Home exercise guidelines will be given to patient during program as part of exercise prescription that the participant will acknowledge.  Activity Barriers & Risk Stratification:  Activity Barriers & Cardiac Risk Stratification - 01/28/21 1054       Activity Barriers & Cardiac Risk Stratification   Activity Barriers Deconditioning;Back Problems;Shortness of Breath;Neck/Spine Problems    Cardiac Risk Stratification Low             6 Minute Walk:  6 Minute Walk     Row Name 01/28/21 1211         6 Minute Walk   Phase Initial     Distance 1028 feet     Walk Time 6 minutes     # of Rest Breaks 2  1:30-2:40, 5:40-6:00. Due to desat     MPH 1.95     METS 2.98     RPE 11     Perceived Dyspnea  3     VO2 Peak 10.42     Symptoms No     Resting HR 73 bpm     Resting BP 120/60     Resting Oxygen Saturation  94 %     Exercise Oxygen Saturation  during 6 min walk 84 %     Max Ex. HR 98 bpm     Max Ex. BP 120/60     2 Minute Post BP 116/60       Interval HR   1 Minute HR 85     2  Minute HR 88  1:19- 84     3 Minute HR 91     4 Minute HR 92     5 Minute HR 91  5:40- 98     6 Minute HR 90     2 Minute Post HR 67     Interval Heart Rate? Yes       Interval Oxygen   Interval Oxygen? Yes     Baseline Oxygen Saturation % 94 %     1 Minute Oxygen Saturation % 85 %     1 Minute Liters of Oxygen 0 L     2 Minute Oxygen Saturation % 84 %  84% @ 1:19     2 Minute Liters of Oxygen 0 L  Increased to 1L     3 Minute Oxygen Saturation % 88 %     3 Minute Liters of Oxygen 1 L     4 Minute Oxygen Saturation % 88 %     4 Minute Liters of Oxygen 1 L     5 Minute Oxygen Saturation % 86 %     5 Minute Liters of Oxygen 1 L     6 Minute Oxygen Saturation % 86 %  84% @ 5:40     6 Minute Liters of Oxygen 2 L  Increased to 2L     2 Minute Post Oxygen Saturation % 100 %  97% @  10 min on RA     2 Minute Post Liters of Oxygen 2 L              Oxygen Initial Assessment:  Oxygen Initial Assessment - 03/09/21 1009       Home Oxygen   Home Oxygen Device Portable Concentrator;Home Concentrator    Home Exercise Oxygen Prescription Continuous    Liters per minute 2             Oxygen Re-Evaluation:  Oxygen Re-Evaluation     Row Name 02/09/21 1110 03/09/21 1009           Program Oxygen Prescription   Program Oxygen Prescription Continuous Continuous      Liters per minute 2 2        Home Oxygen   Home Oxygen Device None --      Sleep Oxygen Prescription None None      Home Exercise Oxygen Prescription None --      Home Resting Oxygen Prescription None None        Goals/Expected Outcomes   Short Term Goals To learn and exhibit compliance with exercise, home and travel O2 prescription;To learn and understand importance of monitoring SPO2 with pulse oximeter and demonstrate accurate use of the pulse oximeter.;To learn and understand importance of maintaining oxygen saturations>88%;To learn and demonstrate proper pursed lip breathing techniques or other breathing  techniques. ;To learn and demonstrate proper use of respiratory medications To learn and exhibit compliance with exercise, home and travel O2 prescription;To learn and understand importance of monitoring SPO2 with pulse oximeter and demonstrate accurate use of the pulse oximeter.;To learn and understand importance of maintaining oxygen saturations>88%;To learn and demonstrate proper pursed lip breathing techniques or other breathing techniques. ;To learn and demonstrate proper use of respiratory medications      Long  Term Goals Exhibits compliance with exercise, home  and travel O2 prescription;Verbalizes importance of monitoring SPO2 with pulse oximeter and return demonstration;Maintenance of O2 saturations>88%;Exhibits proper breathing techniques, such as pursed lip breathing or other method taught during program session;Compliance with respiratory medication;Demonstrates proper use of MDIs Exhibits compliance with exercise, home  and travel O2 prescription;Verbalizes importance of monitoring SPO2 with pulse oximeter and return demonstration;Maintenance of O2 saturations>88%;Exhibits proper breathing techniques, such as pursed lip breathing or other method taught during program session;Compliance with respiratory medication;Demonstrates proper use of MDIs      Goals/Expected Outcomes Compliance and understanding of oxygen saturation and breathing techniques to decrease shortness of breath. Compliance and understanding of oxygen saturation and breathing techniques to decrease shortness of breath.               Oxygen Discharge (Final Oxygen Re-Evaluation):  Oxygen Re-Evaluation - 03/09/21 1009       Program Oxygen Prescription   Program Oxygen Prescription Continuous    Liters per minute 2      Home Oxygen   Sleep Oxygen Prescription None    Home Resting Oxygen Prescription None      Goals/Expected Outcomes   Short Term Goals To learn and exhibit compliance with exercise, home and travel  O2 prescription;To learn and understand importance of monitoring SPO2 with pulse oximeter and demonstrate accurate use of the pulse oximeter.;To learn and understand importance of maintaining oxygen saturations>88%;To learn and demonstrate proper pursed lip breathing techniques or other breathing techniques. ;To learn and demonstrate proper use of respiratory medications    Long  Term Goals Exhibits compliance with exercise, home  and travel O2 prescription;Verbalizes importance  of monitoring SPO2 with pulse oximeter and return demonstration;Maintenance of O2 saturations>88%;Exhibits proper breathing techniques, such as pursed lip breathing or other method taught during program session;Compliance with respiratory medication;Demonstrates proper use of MDIs    Goals/Expected Outcomes Compliance and understanding of oxygen saturation and breathing techniques to decrease shortness of breath.             Initial Exercise Prescription:  Initial Exercise Prescription - 01/28/21 1200       Date of Initial Exercise RX and Referring Provider   Date 01/28/21    Referring Provider [name]    Expected Discharge Date 04/02/21      Oxygen   Oxygen Continuous    Liters 2    Maintain Oxygen Saturation 88% or higher      Recumbant Bike   Level 1    RPM 40    Minutes 15      Arm Ergometer   Level 1    Minutes 15    METs 1.3      Prescription Details   Frequency (times per week) 2    Duration Progress to 30 minutes of continuous aerobic without signs/symptoms of physical distress      Intensity   THRR 40-80% of Max Heartrate 65-131    Ratings of Perceived Exertion 11-13    Perceived Dyspnea 0-4      Progression   Progression Continue to progress workloads to maintain intensity without signs/symptoms of physical distress.      Resistance Training   Training Prescription Yes    Weight red bands    Reps 10-15             Perform Capillary Blood Glucose checks as needed.  Exercise  Prescription Changes:   Exercise Prescription Changes     Row Name 02/10/21 1500 02/24/21 1200 03/03/21 1200 03/10/21 1300       Response to Exercise   Blood Pressure (Admit) 100/62 110/70 -- 120/70    Blood Pressure (Exercise) 122/58 130/64 -- 138/68    Blood Pressure (Exit) 104/54 109/72 -- 108/68    Heart Rate (Admit) 76 bpm 80 bpm -- 79 bpm    Heart Rate (Exercise) 95 bpm 99 bpm -- 103 bpm    Heart Rate (Exit) 82 bpm 79 bpm -- 80 bpm    Oxygen Saturation (Admit) 97 % 96 % -- 95 %    Oxygen Saturation (Exercise) 97 % 93 % -- 93 %    Oxygen Saturation (Exit) 96 % 91 % -- 90 %    Rating of Perceived Exertion (Exercise) 11 11 -- 11    Perceived Dyspnea (Exercise) 1 2 -- 1    Duration Continue with 30 min of aerobic exercise without signs/symptoms of physical distress. Continue with 30 min of aerobic exercise without signs/symptoms of physical distress. -- Continue with 30 min of aerobic exercise without signs/symptoms of physical distress.    Intensity THRR unchanged THRR unchanged -- THRR unchanged      Progression   Progression Continue to progress workloads to maintain intensity without signs/symptoms of physical distress. Continue to progress workloads to maintain intensity without signs/symptoms of physical distress. -- Continue to progress workloads to maintain intensity without signs/symptoms of physical distress.      Resistance Training   Training Prescription Yes Yes -- Yes    Weight red bands red bands -- blue bands    Reps 10-15 10-15 -- 10-15    Time 10 Minutes 10 Minutes -- 10 Minutes  Oxygen   Oxygen Continuous Continuous -- Continuous    Liters 2 2 -- 2      Recumbant Bike   Level 1 2 -- 3    Minutes 15 15 -- 15    METs 2.1 3 -- 4      Arm Ergometer   Level 1 -- -- --    Watts 1  49 -- -- --    Minutes 15 -- -- --      Track   Laps -- 9 -- 16    Minutes -- 15 -- 15    METs -- 2.05 -- 2.86      Home Exercise Plan   Plans to continue exercise at --  -- [employer] (comment)  [employer] --    Frequency -- -- Add 1 additional day to program exercise sessions. --    Initial Home Exercises Provided -- -- 03/03/21 --      Oxygen   Maintain Oxygen Saturation 88% or higher 88% or higher -- 88% or higher             Exercise Comments:   Exercise Comments     Row Name 02/03/21 1209 03/03/21 1204         Exercise Comments Pt completed first day of exercise. She exercised for 15 min on the recumbent bike and arm ergometer. Arvetta averaged 2.1 METS on the recumbent bike and 1 watt on the arm ergometer. She performed the warmup and cooldown standing without complaints. Completed home exercise plan. Rhiann is not currently exercising at home. I encouraged her to start exercising at home at least 1 non-rehab day/wk for 30 min/day. 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 She used to do exercise classes at the [facility]. Derin mentioned that she wanted to get back into her exercise classes and walking the track. I reviewed the RPE and dyspnea scale with her. I recommended purchasing a pulse oximeter so that she can monitor her oxygen saturation. She agreed with my recommendations.               Exercise Goals and Review:   Exercise Goals     Row Name 01/28/21 1210 02/09/21 1107 03/09/21 1002         Exercise Goals   Increase Physical Activity Yes Yes Yes     Intervention Provide advice, education, support and counseling about physical activity/exercise needs.;Develop an individualized exercise prescription for aerobic and resistive training based on initial evaluation findings, risk stratification, comorbidities and participant's personal goals. Provide advice, education, support and counseling about physical activity/exercise needs.;Develop an individualized exercise prescription for aerobic and resistive training based on initial evaluation findings, risk stratification, comorbidities and  participant's personal goals. Provide advice, education, support and counseling about physical activity/exercise needs.;Develop an individualized exercise prescription for aerobic and resistive training based on initial evaluation findings, risk stratification, comorbidities and participant's personal goals.     Expected Outcomes Short Term: Attend rehab on a regular basis to increase amount of physical activity.;Long Term: Exercising regularly at least 3-5 days a week. Short Term: Attend rehab on a regular basis to increase amount of physical activity.;Long Term: Exercising regularly at least 3-5 days a week. Short Term: Attend rehab on a regular basis to increase amount of physical activity.;Long Term: Exercising regularly at least 3-5 days a week.;Long Term: Add in home exercise to make exercise part of routine and to increase amount of physical activity.  Increase Strength and Stamina Yes Yes Yes     Intervention Develop an individualized exercise prescription for aerobic and resistive training based on initial evaluation findings, risk stratification, comorbidities and participant's personal goals.;Provide advice, education, support and counseling about physical activity/exercise needs. Develop an individualized exercise prescription for aerobic and resistive training based on initial evaluation findings, risk stratification, comorbidities and participant's personal goals.;Provide advice, education, support and counseling about physical activity/exercise needs. Develop an individualized exercise prescription for aerobic and resistive training based on initial evaluation findings, risk stratification, comorbidities and participant's personal goals.;Provide advice, education, support and counseling about physical activity/exercise needs.     Expected Outcomes Short Term: Increase workloads from initial exercise prescription for resistance, speed, and METs.;Short Term: Perform resistance training exercises  routinely during rehab and add in resistance training at home;Long Term: Improve cardiorespiratory fitness, muscular endurance and strength as measured by increased METs and functional capacity (6MWT) Short Term: Increase workloads from initial exercise prescription for resistance, speed, and METs.;Short Term: Perform resistance training exercises routinely during rehab and add in resistance training at home;Long Term: Improve cardiorespiratory fitness, muscular endurance and strength as measured by increased METs and functional capacity (6MWT) Short Term: Increase workloads from initial exercise prescription for resistance, speed, and METs.;Short Term: Perform resistance training exercises routinely during rehab and add in resistance training at home;Long Term: Improve cardiorespiratory fitness, muscular endurance and strength as measured by increased METs and functional capacity (6MWT)     Able to understand and use rate of perceived exertion (RPE) scale Yes Yes Yes     Intervention Provide education and explanation on how to use RPE scale Provide education and explanation on how to use RPE scale Provide education and explanation on how to use RPE scale     Expected Outcomes Short Term: Able to use RPE daily in rehab to express subjective intensity level;Long Term:  Able to use RPE to guide intensity level when exercising independently Short Term: Able to use RPE daily in rehab to express subjective intensity level;Long Term:  Able to use RPE to guide intensity level when exercising independently Short Term: Able to use RPE daily in rehab to express subjective intensity level;Long Term:  Able to use RPE to guide intensity level when exercising independently     Able to understand and use Dyspnea scale Yes Yes Yes     Intervention Provide education and explanation on how to use Dyspnea scale Provide education and explanation on how to use Dyspnea scale Provide education and explanation on how to use Dyspnea  scale     Expected Outcomes Long Term: Able to use Dyspnea scale to guide intensity level when exercising independently Long Term: Able to use Dyspnea scale to guide intensity level when exercising independently Long Term: Able to use Dyspnea scale to guide intensity level when exercising independently;Short Term: Able to use Dyspnea scale daily in rehab to express subjective sense of shortness of breath during exertion     Knowledge and understanding of Target Heart Rate Range (THRR) Yes Yes Yes     Intervention Provide education and explanation of THRR including how the numbers were predicted and where they are located for reference Provide education and explanation of THRR including how the numbers were predicted and where they are located for reference Provide education and explanation of THRR including how the numbers were predicted and where they are located for reference     Expected Outcomes Long Term: Able to use THRR to govern intensity when exercising independently;Short  Term: Able to state/look up THRR;Short Term: Able to use daily as guideline for intensity in rehab Long Term: Able to use THRR to govern intensity when exercising independently;Short Term: Able to state/look up THRR;Short Term: Able to use daily as guideline for intensity in rehab Long Term: Able to use THRR to govern intensity when exercising independently;Short Term: Able to state/look up THRR;Short Term: Able to use daily as guideline for intensity in rehab     Understanding of Exercise Prescription Yes Yes Yes     Intervention Provide education, explanation, and written materials on patient's individual exercise prescription Provide education, explanation, and written materials on patient's individual exercise prescription Provide education, explanation, and written materials on patient's individual exercise prescription     Expected Outcomes Short Term: Able to explain program exercise prescription;Long Term: Able to explain  home exercise prescription to exercise independently Short Term: Able to explain program exercise prescription;Long Term: Able to explain home exercise prescription to exercise independently Short Term: Able to explain program exercise prescription;Long Term: Able to explain home exercise prescription to exercise independently              Exercise Goals Re-Evaluation :  Exercise Goals Re-Evaluation     Cameron Name 02/09/21 1107 03/09/21 1002           Exercise Goal Re-Evaluation   Exercise Goals Review Increase Physical Activity;Increase Strength and Stamina;Able to understand and use rate of perceived exertion (RPE) scale;Able to understand and use Dyspnea scale;Knowledge and understanding of Target Heart Rate Range (THRR);Understanding of Exercise Prescription Increase Physical Activity;Increase Strength and Stamina;Able to understand and use rate of perceived exertion (RPE) scale;Able to understand and use Dyspnea scale;Knowledge and understanding of Target Heart Rate Range (THRR);Understanding of Exercise Prescription      Comments Tomeka has complete 2 exercise sessions. She exercises for 15 min on the recumbent bike and arm ergometer. She averages 2.2 METs at level 1 on the recumbent bike and 1 watt at level 1 on the arm ergometer. It is too soon to note any discernable progressions for both exercise modes. She performs the warmup and cooldown standing without limitations. Will continue to monitor and progress as able. Leylani has complete 9 exercise sessions. She exercises for 15 min on the recumbent bike and track. Sherley was switched from the arm ergometer to the track. She tolerates the track well. Analese averages 3.6 METs at level 2.5 on the recumbent bike and 2.16 METs on the track. Her lap count can change on the track dependent on her low back pain. Lisbet performs the warmup and cooldown standing without limitations. Her band resistance has increased as she tolerates the new  resistance band well. Zakirah is motivated to exercise and improve her functional capacity. We recently discussed her home exercise as she plans to join the [facility]. Will continue to monitor and progress as able.      Expected Outcomes Through exercise at rehab and home, the patient will decrease shortness of breath with daily activities and feel confident in carrying out and exercise regimen at home. Through exercise at rehab and home, the patient will decrease shortness of breath with daily activities and feel confident in carrying out and exercise regimen at home.               Discharge Exercise Prescription (Final Exercise Prescription Changes):  Exercise Prescription Changes - 03/10/21 1300       Response to Exercise   Blood Pressure (Admit) 120/70    Blood  Pressure (Exercise) 138/68    Blood Pressure (Exit) 108/68    Heart Rate (Admit) 79 bpm    Heart Rate (Exercise) 103 bpm    Heart Rate (Exit) 80 bpm    Oxygen Saturation (Admit) 95 %    Oxygen Saturation (Exercise) 93 %    Oxygen Saturation (Exit) 90 %    Rating of Perceived Exertion (Exercise) 11    Perceived Dyspnea (Exercise) 1    Duration Continue with 30 min of aerobic exercise without signs/symptoms of physical distress.    Intensity THRR unchanged      Progression   Progression Continue to progress workloads to maintain intensity without signs/symptoms of physical distress.      Resistance Training   Training Prescription Yes    Weight blue bands    Reps 10-15    Time 10 Minutes      Oxygen   Oxygen Continuous    Liters 2      Recumbant Bike   Level 3    Minutes 15    METs 4      Track   Laps 16    Minutes 15    METs 2.86      Oxygen   Maintain Oxygen Saturation 88% or higher             Nutrition:  Target Goals: Understanding of nutrition guidelines, daily intake of sodium <1570m, cholesterol <2041m calories 30% from fat and 7% or less from saturated fats, daily to have 5 or more servings  of fruits and vegetables.  Biometrics:  Pre Biometrics - 01/28/21 1105       Pre Biometrics   Grip Strength 25 kg   right hand             Nutrition Therapy Plan and Nutrition Goals:   Nutrition Assessments:  MEDIFICTS Score Key: ?70 Need to make dietary changes  40-70 Heart Healthy Diet ? 40 Therapeutic Level Cholesterol Diet   Picture Your Plate Scores: <4<54nhealthy dietary pattern with much room for improvement. 41-50 Dietary pattern unlikely to meet recommendations for good health and room for improvement. 51-60 More healthful dietary pattern, with some room for improvement.  >60 Healthy dietary pattern, although there may be some specific behaviors that could be improved.    Nutrition Goals Re-Evaluation:   Nutrition Goals Discharge (Final Nutrition Goals Re-Evaluation):   Psychosocial: Target Goals: Acknowledge presence or absence of significant depression and/or stress, maximize coping skills, provide positive support system. Participant is able to verbalize types and ability to use techniques and skills needed for reducing stress and depression.  Initial Review & Psychosocial Screening:  Initial Psych Review & Screening - 01/28/21 1207       Initial Review   Current issues with Current Depression;History of Depression;Current Anxiety/Panic    Source of Stress Concerns Family;Financial;Transportation;Retirement/disability;Unable to participate in former interests or hobbies;Unable to perform yard/household activities    Comments TaAldines fearing that she will be homeless soon. Her currently living situation is in a home that her uncle owns and he is saying that he is going to sell the home. She has not been approved for disability and she is not able to contribute financially to the home cost. She is at the home most of the time by herself. Her uncle and grandmother moves around living with different family members and [place] that she wants TaAbiro live in the home, but she has  dementia and her uncle will be making all of the decisions about the home.Alexiana has ived in this home since 2008. I contacted a Education officer, museum in the hospital and she came down to see the patient to see if she can provide her any additional input for her situation.She does use Medicaid for her transportation to appointments.She has been unable to do some her interest and do household activities due to her back pain and shortness of breath,      Family Dynamics   Good Support System? No    Strains Intra-family strains    Concerns No support system    Comments She states that she has a "friend" that has been a big help for her with things at the home, because her uncle has refussed to fix things there. Norell states doesn't have a lot of friends or relatives that she can count on to help her. She states that she and her mother do not have a good relationship.      Barriers   Psychosocial barriers to participate in program Psychosocial barriers identified (see note);The patient should benefit from training in stress management and relaxation.   Her living situation, fear of being homeless and not being approved for disability has caused her a great deal of stress.     Screening Interventions   Interventions Encouraged to exercise;To provide support and resources with identified psychosocial needs;Other (comment)    Comments Had a hospital social worker come to see pt to see if she could provide any additional help with her situation.    Expected Outcomes Short Term goal: Utilizing psychosocial counselor, staff and physician to assist with identification of specific Stressors or current issues interfering with healing process. Setting desired goal for each stressor or current issue identified.;Long Term Goal: Stressors or current issues are controlled or eliminated.;Short Term goal: Identification and review with  participant of any Quality of Life or Depression concerns found by scoring the questionnaire.;Long Term goal: The participant improves quality of Life and PHQ9 Scores as seen by post scores and/or verbalization of changes             Quality of Life Scores:  Scores of 19 and below usually indicate a poorer quality of life in these areas.  A difference of  2-3 points is a clinically meaningful difference.  A difference of 2-3 points in the total score of the Quality of Life Index has been associated with significant improvement in overall quality of life, self-image, physical symptoms, and general health in studies assessing change in quality of life.  PHQ-9: Recent Review Flowsheet Data     Depression screen [facility] 2/9 01/28/2021 12/01/2020 10/22/2020 05/27/2020 10/23/2019   Decreased Interest '2 1 3 3 1   ' Down, Depressed, Hopeless '3 1 3 3 3   ' PHQ - 2 Score '5 2 6 6 4   ' Altered sleeping '1 2 1 2 1   ' Tired, decreased energy 1 1 0 2 1   Change in appetite 0 2 0 2 0   Feeling bad or failure about yourself  '1 2 1 2 1   ' Trouble concentrating '1 2 1 2 2   ' Moving slowly or fidgety/restless 0 0 1 0 0   Suicidal thoughts '1 1 1  1 1   ' PHQ-9 Score '10 12 11 17 10   ' Difficult doing work/chores Somewhat difficult - Very difficult - -      Interpretation of Total Score  Total Score Depression Severity:  1-4 =  Minimal depression, 5-9 = Mild depression, 10-14 = Moderate depression, 15-19 = Moderately severe depression, 20-27 = Severe depression   Psychosocial Evaluation and Intervention:  Psychosocial Evaluation - 01/28/21 1251       Psychosocial Evaluation & Interventions   Interventions Stress management education;Relaxation education;Encouraged to exercise with the program and follow exercise prescription    Comments Alaura has biweekly appointments with online therapist and is on medications for depression. She states that she feels all of this has helped her situation.    Expected Outcomes That  Maudy's  psychosocial issues will not proibit her from particiaption in the PR program and be able to manage her stress in a healthy way.    Continue Psychosocial Services  Follow up required by staff             Psychosocial Re-Evaluation:  Psychosocial Re-Evaluation     [place] Name 02/18/21 1600 03/18/21 1551           Psychosocial Re-Evaluation   Current issues with Current Depression;History of Depression;Current Anxiety/Panic Current Depression;History of Depression;Current Stress Concerns      Comments Irys situation has improved that she has been approved for her disabiltiy. She is vey excited about trying to find heself a place to live. She feels this will significantly improve her depression and anxiety about becoming homeless. Despite her psychosocial barriers she has been able to participate in the PR program and do well. She has attended 4 sessions and been able to to increase her workloads and MET levels. She missed 1 class due to a migraine headache. Analaura is on medication for her depression and is seeing a therapist. She has been approved for her disability recently and this has helped with her depression and stress. She is currently looking for an apartment to rent. Despite her psychosocial barriers she has been able to attend the PR program and progress well.      Expected Outcomes For Johnnye to continue to progress with her workloads and MET levels.Assist her as we are able with her moving forward in her new living situation. For her to continue to participate in PR despite her psychosocial conernc.      Interventions Encouraged to attend Pulmonary Rehabilitation for the exercise Encouraged to attend Pulmonary Rehabilitation for the exercise      Continue Psychosocial Services  Follow up required by staff Follow up required by staff        Initial Review   Source of Stress Concerns -- Chronic Illness;Family;Financial;Unable to participate in former interests or  hobbies;Unable to perform yard/household activities      Comments -- Her issues have improved since her recent approval for disability.               Psychosocial Discharge (Final Psychosocial Re-Evaluation):  Psychosocial Re-Evaluation - 03/18/21 1551       Psychosocial Re-Evaluation   Current issues with Current Depression;History of Depression;Current Stress Concerns    Comments Yanissa is on medication for her depression and is seeing a therapist. She has been approved for her disability recently and this has helped with her depression and stress. She is currently looking for an apartment to rent. Despite her psychosocial barriers she has been able to attend the PR program and progress well.    Expected Outcomes For her to continue to participate in PR despite her psychosocial conernc.    Interventions Encouraged to attend Pulmonary Rehabilitation for the exercise    Continue Psychosocial Services  Follow up required by  staff      Initial Review   Source of Stress Concerns Chronic Illness;Family;Financial;Unable to participate in former interests or hobbies;Unable to perform yard/household activities    Comments Her issues have improved since her recent approval for disability.             Education: Education Goals: Education classes will be provided on a weekly basis, covering required topics. Participant will state understanding/return demonstration of topics presented.  Learning Barriers/Preferences:   Education Topics: Risk Factor Reduction:  -Group instruction that is supported by a PowerPoint presentation. Instructor discusses the definition of a risk factor, different risk factors for pulmonary disease, and how the heart and lungs work together.     Nutrition for Pulmonary Patient:  -Group instruction provided by PowerPoint slides, verbal discussion, and written materials to support subject matter. The instructor gives an explanation and review of healthy diet  recommendations, which includes a discussion on weight management, recommendations for fruit and vegetable consumption, as well as protein, fluid, caffeine, fiber, sodium, sugar, and alcohol. Tips for eating when patients are short of breath are discussed. Flowsheet Row PULMONARY REHAB CHRONIC OBSTRUCTIVE PULMONARY DISEASE from 03/05/2021 in [place]  Date 02/05/21  Educator Handout       Pursed Lip Breathing:  -Group instruction that is supported by demonstration and informational handouts. Instructor discusses the benefits of pursed lip and diaphragmatic breathing and detailed demonstration on how to preform both.   Flowsheet Row PULMONARY REHAB CHRONIC OBSTRUCTIVE PULMONARY DISEASE from 03/12/2021 in [place]  Date 03/12/21  Educator Donnetta Simpers  Instruction Review Code 1- Verbalizes Understanding       Oxygen Safety:  -Group instruction provided by PowerPoint, verbal discussion, and written material to support subject matter. There is an overview of What is Oxygen and Why do we need it.  Instructor also reviews how to create a safe environment for oxygen use, the importance of using oxygen as prescribed, and the risks of noncompliance. There is a brief discussion on traveling with oxygen and resources the patient may utilize.   Oxygen Equipment:  -Group instruction provided by [facility] Staff utilizing handouts, written materials, and equipment demonstrations.   Signs and Symptoms:  -Group instruction provided by written material and verbal discussion to support subject matter. Warning signs and symptoms of infection, stroke, and heart attack are reviewed and when to call the physician/911 reinforced. Tips for preventing the spread of infection discussed.   Advanced Directives:  -Group instruction provided by verbal instruction and written material to support subject matter. Instructor reviews Advanced Directive  laws and proper instruction for filling out document.   Pulmonary Video:  -Group video education that reviews the importance of medication and oxygen compliance, exercise, good nutrition, pulmonary hygiene, and pursed lip and diaphragmatic breathing for the pulmonary patient.   Exercise for the Pulmonary Patient:  -Group instruction that is supported by a PowerPoint presentation. Instructor discusses benefits of exercise, core components of exercise, frequency, duration, and intensity of an exercise routine, importance of utilizing pulse oximetry during exercise, safety while exercising, and options of places to exercise outside of rehab.   Flowsheet Row PULMONARY REHAB CHRONIC OBSTRUCTIVE PULMONARY DISEASE from 03/05/2021 in [place]  Date 02/19/21  Educator --  [Handout]       Pulmonary Medications:  -Verbally interactive group education provided by instructor with focus on inhaled medications and proper administration. Flowsheet Row PULMONARY REHAB CHRONIC OBSTRUCTIVE PULMONARY DISEASE from  03/05/2021 in [place]  Date 02/26/21  Educator Donnetta Simpers  [Handout]       Anatomy and Physiology of the Respiratory System and Intimacy:  -Group instruction provided by PowerPoint, verbal discussion, and written material to support subject matter. Instructor reviews respiratory cycle and anatomical components of the respiratory system and their functions. Instructor also reviews differences in obstructive and restrictive respiratory diseases with examples of each. Intimacy, Sex, and Sexuality differences are reviewed with a discussion on how relationships can change when diagnosed with pulmonary disease. Common sexual concerns are reviewed.   MD DAY -A group question and answer session with a medical doctor that allows participants to ask questions that relate to their pulmonary disease state.   OTHER EDUCATION -Group or  individual verbal, written, or video instructions that support the educational goals of the pulmonary rehab program. [place] from 03/05/2021 in [place]  Date 03/05/21  Educator My Plate       Holiday Eating Survival Tips:  -Group instruction provided by PowerPoint slides, verbal discussion, and written materials to support subject matter. The instructor gives patients tips, tricks, and techniques to help them not only survive but enjoy the holidays despite the onslaught of food that accompanies the holidays.   Knowledge Questionnaire Score:  Knowledge Questionnaire Score - 01/28/21 1205       Knowledge Questionnaire Score   Pre Score 15/18             Core Components/Risk Factors/Patient Goals at Admission:  Personal Goals and Risk Factors at Admission - 01/28/21 1258       Core Components/Risk Factors/Patient Goals on Admission   Tobacco Cessation Yes    Number of packs per day Some days none, on the days that her uncle causes her stress 4-5 cigarettes per day.    Intervention Assist the participant in steps to quit. Provide individualized education and counseling about committing to Tobacco Cessation, relapse prevention, and pharmacological support that can be provided by physician.;Advice worker, assist with locating and accessing local/national Quit Smoking programs, and support quit date choice.    Expected Outcomes Short Term: Will demonstrate readiness to quit, by selecting a quit date.;Short Term: Will quit all tobacco product use, adhering to prevention of relapse plan.;Long Term: Complete abstinence from all tobacco products for at least 12 months from quit date.    Improve shortness of breath with ADL's Yes    Intervention Provide education, individualized exercise plan and daily activity instruction to help decrease symptoms of SOB with activities of daily  living.    Expected Outcomes Short Term: Improve cardiorespiratory fitness to achieve a reduction of symptoms when performing ADLs;Long Term: Be able to perform more ADLs without symptoms or delay the onset of symptoms    Increase knowledge of respiratory medications and ability to use respiratory devices properly  Yes    Intervention Provide education and demonstration as needed of appropriate use of medications, inhalers, and oxygen therapy.    Expected Outcomes Short Term: Achieves understanding of medications use. Understands that oxygen is a medication prescribed by physician. Demonstrates appropriate use of inhaler and oxygen therapy.;Long Term: Maintain appropriate use of medications, inhalers, and oxygen therapy.             Core Components/Risk Factors/Patient Goals Review:   Goals and Risk Factor Review     Row Name 02/18/21 1616 03/18/21 1559  Core Components/Risk Factors/Patient Goals Review   Personal Goals Review Tobacco Cessation;Improve shortness of breath with ADL's;Develop more efficient breathing techniques such as purse lipped breathing and diaphragmatic breathing and practicing self-pacing with activity.;Increase knowledge of respiratory medications and ability to use respiratory devices properly. Tobacco Cessation;Improve shortness of breath with ADL's;Develop more efficient breathing techniques such as purse lipped breathing and diaphragmatic breathing and practicing self-pacing with activity.;Increase knowledge of respiratory medications and ability to use respiratory devices properly.      Review Kiwanna was off to a slow start with her progression of exercise. Her last session of exercise after finding out that she had been approved for her disabilityshe made a big imporvement. She increased her workload and MEt level on the recumbent bike and walked 14 laps on the track. She has been exercising on oxygen 2 liters her saturations have been from 91-97%. She has  no home oxygen. She has continued to smoke" some days are better that others." I has stressed the importants of total cessation, encouraged her to chose a quit date and use the 1800 quit line. She has not utilized the quit line and feels that until she is completely away from her living situation that it won't change due to the stress there.She is exercising on the recumbent bike and track progressing well. She now has her home oxygen and her portable tank. She is walking in on her oxygen to PR. Her oxygen saturations have been from 90-95% while exercising on 2 liters of oxygen.      Expected Outcomes For Owen to continue to progress in her workloads and MET levels, Improve her SOB with ADL and to use better breathing techniques.Complete tobacco cessation. Some days she has none,other days she gets stressed and will smoke some. Total smoking cessation. Continue to progress with exercise levels and METs. Improve her SOB with ADL's and more efficient breathing techniques.               Core Components/Risk Factors/Patient Goals at Discharge (Final Review):   Goals and Risk Factor Review - 03/18/21 1559       Core Components/Risk Factors/Patient Goals Review   Personal Goals Review Tobacco Cessation;Improve shortness of breath with ADL's;Develop more efficient breathing techniques such as purse lipped breathing and diaphragmatic breathing and practicing self-pacing with activity.;Increase knowledge of respiratory medications and ability to use respiratory devices properly.    Review She has continued to smoke" some days are better that others." I has stressed the importants of total cessation, encouraged her to chose a quit date and use the 1800 quit line. She has not utilized the quit line and feels that until she is completely away from her living situation that it won't change due to the stress there.She is exercising on the recumbent bike and track progressing well. She now has her home oxygen and  her portable tank. She is walking in on her oxygen to PR. Her oxygen saturations have been from 90-95% while exercising on 2 liters of oxygen.    Expected Outcomes Total smoking cessation. Continue to progress with exercise levels and METs. Improve her SOB with ADL's and more efficient breathing techniques.             ITP Comments:   Comments: ITP REVIEW Pt is making expected progress toward pulmonary rehab goals after completing 12 sessions. Recommend continued exercise, life style modification, education, and utilization of breathing techniques to increase stamina and strength and decrease shortness of breath with exertion.  Dr.  Rodman Pickle is Medical Director for Pulmonary Rehab at [facility].

## 2021-03-18 NOTE — Progress Notes (Signed)
Pulmonary Individual Treatment Plan  Patient Details  Name: Jimia Gentles MRN: [id number] Date of Birth: 24-May-1965 Referring Provider:   [name] Pulmonary Rehab Walk Test from 01/28/2021 in [facility]  Referring Provider [name]       Initial Encounter Date:  Flowsheet Row Pulmonary Rehab Walk Test from 01/28/2021 in [facility]  Date 01/28/21       Visit Diagnosis: COPD, severe ([place])  Patient's Home Medications on Admission:   Current Outpatient Medications:    Acetaminophen-Codeine 300-30 MG tablet, TAKE 1-2 TABLETS BY MOUTH EVERY 6 (SIX) HOURS AS NEEDED FOR UP TO 7 DAYS FOR MODERATE PAIN., Disp: 40 tablet, Rfl: 0   albuterol (VENTOLIN HFA) 108 (90 Base) MCG/ACT inhaler, Inhale 2 puffs into the lungs every 6 (six) hours as needed for wheezing or shortness of breath., Disp: 18 g, Rfl: 5   amitriptyline (ELAVIL) 50 MG tablet, TAKE 1 TABLET (50 MG TOTAL) BY MOUTH AT BEDTIME., Disp: 90 tablet, Rfl: 1   atorvastatin (LIPITOR) 20 MG tablet, TAKE 1 TABLET (20 MG TOTAL) BY MOUTH DAILY., Disp: 90 tablet, Rfl: 1   Azelastine HCl 137 MCG/SPRAY SOLN, Place into both nostrils., Disp: , Rfl:    budesonide-formoterol (SYMBICORT) 160-4.5 MCG/ACT inhaler, Inhale 2 puffs into the lungs in the morning and at bedtime., Disp: 10.2 g, Rfl: 3   cetirizine (ZYRTEC) 10 MG tablet, Take 1 tablet (10 mg total) by mouth daily., Disp: 90 tablet, Rfl: 1   cyclobenzaprine (FLEXERIL) 10 MG tablet, TAKE 1 TABLET (10 MG TOTAL) BY MOUTH 2 (TWO) TIMES DAILY AS NEEDED FOR MUSCLE SPASMS., Disp: 180 tablet, Rfl: 1   diclofenac sodium (VOLTAREN) 1 % GEL, Apply 2 g topically 4 (four) times daily., Disp: 5 Tube, Rfl: 2   DULoxetine (CYMBALTA) 60 MG capsule, TAKE 1 CAPSULE (60 MG TOTAL) BY MOUTH DAILY., Disp: 90 capsule, Rfl: 1   ezetimibe (ZETIA) 10 MG tablet, Take 1 tablet (10 mg total) by mouth daily after supper., Disp: 90 tablet, Rfl: 1   fluconazole  (DIFLUCAN) 150 MG tablet, Take 1 tablet (150 mg total) by mouth once a week., Disp: 2 tablet, Rfl: 0   fluticasone (FLONASE) 50 MCG/ACT nasal spray, SPRAY 2 SPRAYS INTO EACH NOSTRIL EVERY DAY, Disp: 16 mL, Rfl: 2   Lactobacillus CAPS, Take 1 capsule by mouth daily., Disp: , Rfl:    lidocaine (LIDODERM) 5 %, Place 1 patch onto the skin daily. Remove & Discard patch within 12 hours or as directed by MD, Disp: 30 patch, Rfl: 3   miconazole (MICATIN) 2 % cream, Apply 1 application topically 2 (two) times daily., Disp: 28.35 g, Rfl: 0   montelukast (SINGULAIR) 10 MG tablet, Take 10 mg by mouth daily., Disp: , Rfl:    Multiple Vitamin (MULTIVITAMIN WITH MINERALS) TABS tablet, Take 1 tablet by mouth daily., Disp: , Rfl:    naproxen (NAPROSYN) 500 MG tablet, TAKE 1 TABLET BY MOUTH TWO TIMES DAILY WITH MEALS, Disp: 60 tablet, Rfl: 3   nicotine (NICODERM CQ) 21 mg/24hr patch, Place 1 patch (21 mg total) onto the skin daily., Disp: 28 patch, Rfl: 0   oxybutynin (DITROPAN) 5 MG tablet, TAKE 1 TABLET (5 MG TOTAL) BY MOUTH 2 (TWO) TIMES DAILY., Disp: 180 tablet, Rfl: 1   pregabalin (LYRICA) 75 MG capsule, Take 1 capsule (75 mg total) by mouth 2 (two) times daily., Disp: 60 capsule, Rfl: 5   RESTASIS 0.05 % ophthalmic emulsion, , Disp: , Rfl:  3   Tiotropium Bromide Monohydrate (SPIRIVA RESPIMAT) 2.5 MCG/ACT AERS, Inhale 2 puffs into the lungs daily., Disp: 1 each, Rfl: 12   triamcinolone cream (KENALOG) 0.1 %, Apply 1 application topically 2 (two) times daily., Disp: 45 g, Rfl: 1  Current Facility-Administered Medications:    0.9 %  sodium chloride infusion, 500 mL, Intravenous, Once, Danis, Kirke Corin, MD  Past Medical History: Past Medical History:  Diagnosis Date   Acid reflux    Allergy    Carpal tunnel syndrome    COPD (chronic obstructive pulmonary disease) (HCC)    Depression    Diverticulitis    Hyperlipidemia    Low back pain    Neck pain    Sacroiliac inflammation (HCC)    Sciatica     Seasonal allergies     Tobacco Use: Social History   Tobacco Use  Smoking Status Some Days   Packs/day: 0.25   Years: 35.00   Pack years: 8.75   Types: Cigarettes   Start date: 41  Smokeless Tobacco Never  Tobacco Comments   Wears patches, 5 cigarettes smoked on some days especially the days that her uncle stress her with leaving the home.Some days smokes none when her uncle is out of town and not in the home.    Labs: Recent Review Flowsheet Data     Labs for ITP Cardiac and Pulmonary Rehab Latest Ref Rng & Units 09/20/2018 07/04/2019 01/28/2020 09/30/2020 12/01/2020   Cholestrol 100 - 199 mg/dL 202(H) 192 191 197 146   LDLCALC 0 - 99 mg/dL 120(H) 116(H) 117(H) 120(H) 63   HDL >39 mg/dL 67 64 60 63 70   Trlycerides 0 - 149 mg/dL 83 65 74 80 65   Hemoglobin A1c 0.0 - 7.0 % 5.6 5.6(A) - - 5.9       Capillary Blood Glucose: Lab Results  Component Value Date   GLUCAP 79 11/10/2011     Pulmonary Assessment Scores:  Pulmonary Assessment Scores     Row Name 01/28/21 1205         ADL UCSD   SOB Score total 48       CAT Score   CAT Score 26       mMRC Score   mMRC Score 4             UCSD: Self-administered rating of dyspnea associated with activities of daily living (ADLs) 6-point scale (0 = "not at all" to 5 = "maximal or unable to do because of breathlessness")  Scoring Scores range from 0 to 120.  Minimally important difference is 5 units  CAT: CAT can identify the health impairment of COPD patients and is better correlated with disease progression.  CAT has a scoring range of zero to 40. The CAT score is classified into four groups of low (less than 10), medium (10 - 20), high (21-30) and very high (31-40) based on the impact level of disease on health status. A CAT score over 10 suggests significant symptoms.  A worsening CAT score could be explained by an exacerbation, poor medication adherence, poor inhaler technique, or progression of COPD or comorbid  conditions.  CAT MCID is 2 points  mMRC: mMRC (Modified Medical Research Council) Dyspnea Scale is used to assess the degree of baseline functional disability in patients of respiratory disease due to dyspnea. No minimal important difference is established. A decrease in score of 1 point or greater is considered a positive change.   Pulmonary Function Assessment:  Pulmonary  Function Assessment - 01/28/21 1057       Breath   Bilateral Breath Sounds Clear    Shortness of Breath Fear of Shortness of Breath;Limiting activity;Panic with Shortness of Breath             Exercise Target Goals: Exercise Program Goal: Individual exercise prescription set using results from initial 6 min walk test and THRR while considering  patients activity barriers and safety.   Exercise Prescription Goal: Initial exercise prescription builds to 30-45 minutes a day of aerobic activity, 2-3 days per week.  Home exercise guidelines will be given to patient during program as part of exercise prescription that the participant will acknowledge.  Activity Barriers & Risk Stratification:  Activity Barriers & Cardiac Risk Stratification - 01/28/21 1054       Activity Barriers & Cardiac Risk Stratification   Activity Barriers Deconditioning;Back Problems;Shortness of Breath;Neck/Spine Problems    Cardiac Risk Stratification Low             6 Minute Walk:  6 Minute Walk     Row Name 01/28/21 1211         6 Minute Walk   Phase Initial     Distance 1028 feet     Walk Time 6 minutes     # of Rest Breaks 2  1:30-2:40, 5:40-6:00. Due to desat     MPH 1.95     METS 2.98     RPE 11     Perceived Dyspnea  3     VO2 Peak 10.42     Symptoms No     Resting HR 73 bpm     Resting BP 120/60     Resting Oxygen Saturation  94 %     Exercise Oxygen Saturation  during 6 min walk 84 %     Max Ex. HR 98 bpm     Max Ex. BP 120/60     2 Minute Post BP 116/60       Interval HR   1 Minute HR 85     2  Minute HR 88  1:19- 84     3 Minute HR 91     4 Minute HR 92     5 Minute HR 91  5:40- 98     6 Minute HR 90     2 Minute Post HR 67     Interval Heart Rate? Yes       Interval Oxygen   Interval Oxygen? Yes     Baseline Oxygen Saturation % 94 %     1 Minute Oxygen Saturation % 85 %     1 Minute Liters of Oxygen 0 L     2 Minute Oxygen Saturation % 84 %  84% @ 1:19     2 Minute Liters of Oxygen 0 L  Increased to 1L     3 Minute Oxygen Saturation % 88 %     3 Minute Liters of Oxygen 1 L     4 Minute Oxygen Saturation % 88 %     4 Minute Liters of Oxygen 1 L     5 Minute Oxygen Saturation % 86 %     5 Minute Liters of Oxygen 1 L     6 Minute Oxygen Saturation % 86 %  84% @ 5:40     6 Minute Liters of Oxygen 2 L  Increased to 2L     2 Minute Post Oxygen Saturation % 100 %  97% @  10 min on RA     2 Minute Post Liters of Oxygen 2 L              Oxygen Initial Assessment:  Oxygen Initial Assessment - 03/09/21 1009       Home Oxygen   Home Oxygen Device Portable Concentrator;Home Concentrator    Home Exercise Oxygen Prescription Continuous    Liters per minute 2             Oxygen Re-Evaluation:  Oxygen Re-Evaluation     Row Name 02/09/21 1110 03/09/21 1009           Program Oxygen Prescription   Program Oxygen Prescription Continuous Continuous      Liters per minute 2 2        Home Oxygen   Home Oxygen Device None --      Sleep Oxygen Prescription None None      Home Exercise Oxygen Prescription None --      Home Resting Oxygen Prescription None None        Goals/Expected Outcomes   Short Term Goals To learn and exhibit compliance with exercise, home and travel O2 prescription;To learn and understand importance of monitoring SPO2 with pulse oximeter and demonstrate accurate use of the pulse oximeter.;To learn and understand importance of maintaining oxygen saturations>88%;To learn and demonstrate proper pursed lip breathing techniques or other breathing  techniques. ;To learn and demonstrate proper use of respiratory medications To learn and exhibit compliance with exercise, home and travel O2 prescription;To learn and understand importance of monitoring SPO2 with pulse oximeter and demonstrate accurate use of the pulse oximeter.;To learn and understand importance of maintaining oxygen saturations>88%;To learn and demonstrate proper pursed lip breathing techniques or other breathing techniques. ;To learn and demonstrate proper use of respiratory medications      Long  Term Goals Exhibits compliance with exercise, home  and travel O2 prescription;Verbalizes importance of monitoring SPO2 with pulse oximeter and return demonstration;Maintenance of O2 saturations>88%;Exhibits proper breathing techniques, such as pursed lip breathing or other method taught during program session;Compliance with respiratory medication;Demonstrates proper use of MDIs Exhibits compliance with exercise, home  and travel O2 prescription;Verbalizes importance of monitoring SPO2 with pulse oximeter and return demonstration;Maintenance of O2 saturations>88%;Exhibits proper breathing techniques, such as pursed lip breathing or other method taught during program session;Compliance with respiratory medication;Demonstrates proper use of MDIs      Goals/Expected Outcomes Compliance and understanding of oxygen saturation and breathing techniques to decrease shortness of breath. Compliance and understanding of oxygen saturation and breathing techniques to decrease shortness of breath.               Oxygen Discharge (Final Oxygen Re-Evaluation):  Oxygen Re-Evaluation - 03/09/21 1009       Program Oxygen Prescription   Program Oxygen Prescription Continuous    Liters per minute 2      Home Oxygen   Sleep Oxygen Prescription None    Home Resting Oxygen Prescription None      Goals/Expected Outcomes   Short Term Goals To learn and exhibit compliance with exercise, home and travel  O2 prescription;To learn and understand importance of monitoring SPO2 with pulse oximeter and demonstrate accurate use of the pulse oximeter.;To learn and understand importance of maintaining oxygen saturations>88%;To learn and demonstrate proper pursed lip breathing techniques or other breathing techniques. ;To learn and demonstrate proper use of respiratory medications    Long  Term Goals Exhibits compliance with exercise, home  and travel O2 prescription;Verbalizes importance  of monitoring SPO2 with pulse oximeter and return demonstration;Maintenance of O2 saturations>88%;Exhibits proper breathing techniques, such as pursed lip breathing or other method taught during program session;Compliance with respiratory medication;Demonstrates proper use of MDIs    Goals/Expected Outcomes Compliance and understanding of oxygen saturation and breathing techniques to decrease shortness of breath.             Initial Exercise Prescription:  Initial Exercise Prescription - 01/28/21 1200       Date of Initial Exercise RX and Referring Provider   Date 01/28/21    Referring Provider [name]    Expected Discharge Date 04/02/21      Oxygen   Oxygen Continuous    Liters 2    Maintain Oxygen Saturation 88% or higher      Recumbant Bike   Level 1    RPM 40    Minutes 15      Arm Ergometer   Level 1    Minutes 15    METs 1.3      Prescription Details   Frequency (times per week) 2    Duration Progress to 30 minutes of continuous aerobic without signs/symptoms of physical distress      Intensity   THRR 40-80% of Max Heartrate 65-131    Ratings of Perceived Exertion 11-13    Perceived Dyspnea 0-4      Progression   Progression Continue to progress workloads to maintain intensity without signs/symptoms of physical distress.      Resistance Training   Training Prescription Yes    Weight red bands    Reps 10-15             Perform Capillary Blood Glucose checks as needed.  Exercise  Prescription Changes:   Exercise Prescription Changes     Row Name 02/10/21 1500 02/24/21 1200 03/03/21 1200 03/10/21 1300       Response to Exercise   Blood Pressure (Admit) 100/62 110/70 -- 120/70    Blood Pressure (Exercise) 122/58 130/64 -- 138/68    Blood Pressure (Exit) 104/54 109/72 -- 108/68    Heart Rate (Admit) 76 bpm 80 bpm -- 79 bpm    Heart Rate (Exercise) 95 bpm 99 bpm -- 103 bpm    Heart Rate (Exit) 82 bpm 79 bpm -- 80 bpm    Oxygen Saturation (Admit) 97 % 96 % -- 95 %    Oxygen Saturation (Exercise) 97 % 93 % -- 93 %    Oxygen Saturation (Exit) 96 % 91 % -- 90 %    Rating of Perceived Exertion (Exercise) 11 11 -- 11    Perceived Dyspnea (Exercise) 1 2 -- 1    Duration Continue with 30 min of aerobic exercise without signs/symptoms of physical distress. Continue with 30 min of aerobic exercise without signs/symptoms of physical distress. -- Continue with 30 min of aerobic exercise without signs/symptoms of physical distress.    Intensity THRR unchanged THRR unchanged -- THRR unchanged      Progression   Progression Continue to progress workloads to maintain intensity without signs/symptoms of physical distress. Continue to progress workloads to maintain intensity without signs/symptoms of physical distress. -- Continue to progress workloads to maintain intensity without signs/symptoms of physical distress.      Resistance Training   Training Prescription Yes Yes -- Yes    Weight red bands red bands -- blue bands    Reps 10-15 10-15 -- 10-15    Time 10 Minutes 10 Minutes -- 10 Minutes  Oxygen   Oxygen Continuous Continuous -- Continuous    Liters 2 2 -- 2      Recumbant Bike   Level 1 2 -- 3    Minutes 15 15 -- 15    METs 2.1 3 -- 4      Arm Ergometer   Level 1 -- -- --    Watts 1  49 -- -- --    Minutes 15 -- -- --      Track   Laps -- 9 -- 16    Minutes -- 15 -- 15    METs -- 2.05 -- 2.86      Home Exercise Plan   Plans to continue exercise at --  -- [employer] (comment)  [employer] --    Frequency -- -- Add 1 additional day to program exercise sessions. --    Initial Home Exercises Provided -- -- 03/03/21 --      Oxygen   Maintain Oxygen Saturation 88% or higher 88% or higher -- 88% or higher             Exercise Comments:   Exercise Comments     Row Name 02/03/21 1209 03/03/21 1204         Exercise Comments Pt completed first day of exercise. She exercised for 15 min on the recumbent bike and arm ergometer. Kimberla averaged 2.1 METS on the recumbent bike and 1 watt on the arm ergometer. She performed the warmup and cooldown standing without complaints. Completed home exercise plan. Keniesha is not currently exercising at home. I encouraged her to start exercising at home at least 1 non-rehab day/wk for 30 min/day. 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 She used to do exercise classes at the [facility]. Deyja mentioned that she wanted to get back into her exercise classes and walking the track. I reviewed the RPE and dyspnea scale with her. I recommended purchasing a pulse oximeter so that she can monitor her oxygen saturation. She agreed with my recommendations.               Exercise Goals and Review:   Exercise Goals     Row Name 01/28/21 1210 02/09/21 1107 03/09/21 1002         Exercise Goals   Increase Physical Activity Yes Yes Yes     Intervention Provide advice, education, support and counseling about physical activity/exercise needs.;Develop an individualized exercise prescription for aerobic and resistive training based on initial evaluation findings, risk stratification, comorbidities and participant's personal goals. Provide advice, education, support and counseling about physical activity/exercise needs.;Develop an individualized exercise prescription for aerobic and resistive training based on initial evaluation findings, risk stratification, comorbidities and  participant's personal goals. Provide advice, education, support and counseling about physical activity/exercise needs.;Develop an individualized exercise prescription for aerobic and resistive training based on initial evaluation findings, risk stratification, comorbidities and participant's personal goals.     Expected Outcomes Short Term: Attend rehab on a regular basis to increase amount of physical activity.;Long Term: Exercising regularly at least 3-5 days a week. Short Term: Attend rehab on a regular basis to increase amount of physical activity.;Long Term: Exercising regularly at least 3-5 days a week. Short Term: Attend rehab on a regular basis to increase amount of physical activity.;Long Term: Exercising regularly at least 3-5 days a week.;Long Term: Add in home exercise to make exercise part of routine and to increase amount of physical activity.  Increase Strength and Stamina Yes Yes Yes     Intervention Develop an individualized exercise prescription for aerobic and resistive training based on initial evaluation findings, risk stratification, comorbidities and participant's personal goals.;Provide advice, education, support and counseling about physical activity/exercise needs. Develop an individualized exercise prescription for aerobic and resistive training based on initial evaluation findings, risk stratification, comorbidities and participant's personal goals.;Provide advice, education, support and counseling about physical activity/exercise needs. Develop an individualized exercise prescription for aerobic and resistive training based on initial evaluation findings, risk stratification, comorbidities and participant's personal goals.;Provide advice, education, support and counseling about physical activity/exercise needs.     Expected Outcomes Short Term: Increase workloads from initial exercise prescription for resistance, speed, and METs.;Short Term: Perform resistance training exercises  routinely during rehab and add in resistance training at home;Long Term: Improve cardiorespiratory fitness, muscular endurance and strength as measured by increased METs and functional capacity (6MWT) Short Term: Increase workloads from initial exercise prescription for resistance, speed, and METs.;Short Term: Perform resistance training exercises routinely during rehab and add in resistance training at home;Long Term: Improve cardiorespiratory fitness, muscular endurance and strength as measured by increased METs and functional capacity (6MWT) Short Term: Increase workloads from initial exercise prescription for resistance, speed, and METs.;Short Term: Perform resistance training exercises routinely during rehab and add in resistance training at home;Long Term: Improve cardiorespiratory fitness, muscular endurance and strength as measured by increased METs and functional capacity (6MWT)     Able to understand and use rate of perceived exertion (RPE) scale Yes Yes Yes     Intervention Provide education and explanation on how to use RPE scale Provide education and explanation on how to use RPE scale Provide education and explanation on how to use RPE scale     Expected Outcomes Short Term: Able to use RPE daily in rehab to express subjective intensity level;Long Term:  Able to use RPE to guide intensity level when exercising independently Short Term: Able to use RPE daily in rehab to express subjective intensity level;Long Term:  Able to use RPE to guide intensity level when exercising independently Short Term: Able to use RPE daily in rehab to express subjective intensity level;Long Term:  Able to use RPE to guide intensity level when exercising independently     Able to understand and use Dyspnea scale Yes Yes Yes     Intervention Provide education and explanation on how to use Dyspnea scale Provide education and explanation on how to use Dyspnea scale Provide education and explanation on how to use Dyspnea  scale     Expected Outcomes Long Term: Able to use Dyspnea scale to guide intensity level when exercising independently Long Term: Able to use Dyspnea scale to guide intensity level when exercising independently Long Term: Able to use Dyspnea scale to guide intensity level when exercising independently;Short Term: Able to use Dyspnea scale daily in rehab to express subjective sense of shortness of breath during exertion     Knowledge and understanding of Target Heart Rate Range (THRR) Yes Yes Yes     Intervention Provide education and explanation of THRR including how the numbers were predicted and where they are located for reference Provide education and explanation of THRR including how the numbers were predicted and where they are located for reference Provide education and explanation of THRR including how the numbers were predicted and where they are located for reference     Expected Outcomes Long Term: Able to use THRR to govern intensity when exercising independently;Short  Term: Able to state/look up THRR;Short Term: Able to use daily as guideline for intensity in rehab Long Term: Able to use THRR to govern intensity when exercising independently;Short Term: Able to state/look up THRR;Short Term: Able to use daily as guideline for intensity in rehab Long Term: Able to use THRR to govern intensity when exercising independently;Short Term: Able to state/look up THRR;Short Term: Able to use daily as guideline for intensity in rehab     Understanding of Exercise Prescription Yes Yes Yes     Intervention Provide education, explanation, and written materials on patient's individual exercise prescription Provide education, explanation, and written materials on patient's individual exercise prescription Provide education, explanation, and written materials on patient's individual exercise prescription     Expected Outcomes Short Term: Able to explain program exercise prescription;Long Term: Able to explain  home exercise prescription to exercise independently Short Term: Able to explain program exercise prescription;Long Term: Able to explain home exercise prescription to exercise independently Short Term: Able to explain program exercise prescription;Long Term: Able to explain home exercise prescription to exercise independently              Exercise Goals Re-Evaluation :  Exercise Goals Re-Evaluation     [place] Name 02/09/21 1107 03/09/21 1002           Exercise Goal Re-Evaluation   Exercise Goals Review Increase Physical Activity;Increase Strength and Stamina;Able to understand and use rate of perceived exertion (RPE) scale;Able to understand and use Dyspnea scale;Knowledge and understanding of Target Heart Rate Range (THRR);Understanding of Exercise Prescription Increase Physical Activity;Increase Strength and Stamina;Able to understand and use rate of perceived exertion (RPE) scale;Able to understand and use Dyspnea scale;Knowledge and understanding of Target Heart Rate Range (THRR);Understanding of Exercise Prescription      Comments Chantia has complete 2 exercise sessions. She exercises for 15 min on the recumbent bike and arm ergometer. She averages 2.2 METs at level 1 on the recumbent bike and 1 watt at level 1 on the arm ergometer. It is too soon to note any discernable progressions for both exercise modes. She performs the warmup and cooldown standing without limitations. Will continue to monitor and progress as able. Kadience has complete 9 exercise sessions. She exercises for 15 min on the recumbent bike and track. Osiris was switched from the arm ergometer to the track. She tolerates the track well. Karron averages 3.6 METs at level 2.5 on the recumbent bike and 2.16 METs on the track. Her lap count can change on the track dependent on her low back pain. Kristiana performs the warmup and cooldown standing without limitations. Her band resistance has increased as she tolerates the new  resistance band well. Terril is motivated to exercise and improve her functional capacity. We recently discussed her home exercise as she plans to join the [facility]. Will continue to monitor and progress as able.      Expected Outcomes Through exercise at rehab and home, the patient will decrease shortness of breath with daily activities and feel confident in carrying out and exercise regimen at home. Through exercise at rehab and home, the patient will decrease shortness of breath with daily activities and feel confident in carrying out and exercise regimen at home.               Discharge Exercise Prescription (Final Exercise Prescription Changes):  Exercise Prescription Changes - 03/10/21 1300       Response to Exercise   Blood Pressure (Admit) 120/70    Blood  Pressure (Exercise) 138/68    Blood Pressure (Exit) 108/68    Heart Rate (Admit) 79 bpm    Heart Rate (Exercise) 103 bpm    Heart Rate (Exit) 80 bpm    Oxygen Saturation (Admit) 95 %    Oxygen Saturation (Exercise) 93 %    Oxygen Saturation (Exit) 90 %    Rating of Perceived Exertion (Exercise) 11    Perceived Dyspnea (Exercise) 1    Duration Continue with 30 min of aerobic exercise without signs/symptoms of physical distress.    Intensity THRR unchanged      Progression   Progression Continue to progress workloads to maintain intensity without signs/symptoms of physical distress.      Resistance Training   Training Prescription Yes    Weight blue bands    Reps 10-15    Time 10 Minutes      Oxygen   Oxygen Continuous    Liters 2      Recumbant Bike   Level 3    Minutes 15    METs 4      Track   Laps 16    Minutes 15    METs 2.86      Oxygen   Maintain Oxygen Saturation 88% or higher             Nutrition:  Target Goals: Understanding of nutrition guidelines, daily intake of sodium <1512m, cholesterol <2068m calories 30% from fat and 7% or less from saturated fats, daily to have 5 or more servings  of fruits and vegetables.  Biometrics:  Pre Biometrics - 01/28/21 1105       Pre Biometrics   Grip Strength 25 kg   right hand             Nutrition Therapy Plan and Nutrition Goals:   Nutrition Assessments:  MEDIFICTS Score Key: ?70 Need to make dietary changes  40-70 Heart Healthy Diet ? 40 Therapeutic Level Cholesterol Diet   Picture Your Plate Scores: <4<01nhealthy dietary pattern with much room for improvement. 41-50 Dietary pattern unlikely to meet recommendations for good health and room for improvement. 51-60 More healthful dietary pattern, with some room for improvement.  >60 Healthy dietary pattern, although there may be some specific behaviors that could be improved.    Nutrition Goals Re-Evaluation:   Nutrition Goals Discharge (Final Nutrition Goals Re-Evaluation):   Psychosocial: Target Goals: Acknowledge presence or absence of significant depression and/or stress, maximize coping skills, provide positive support system. Participant is able to verbalize types and ability to use techniques and skills needed for reducing stress and depression.  Initial Review & Psychosocial Screening:  Initial Psych Review & Screening - 01/28/21 1207       Initial Review   Current issues with Current Depression;History of Depression;Current Anxiety/Panic    Source of Stress Concerns Family;Financial;Transportation;Retirement/disability;Unable to participate in former interests or hobbies;Unable to perform yard/household activities    Comments TaNkechis fearing that she will be homeless soon. Her currently living situation is in a home that her uncle owns and he is saying that he is going to sell the home. She has not been approved for disability and she is not able to contribute financially to the home cost. She is at the home most of the time by herself. Her uncle and grandmother moves around living with different family members and [place] that she wants [place] live in the home, but she has  dementia and her uncle will be making all of the decisions about the home.Karrina has ived in this home since 2008. I contacted a Education officer, museum in the hospital and she came down to see the patient to see if she can provide her any additional input for her situation.She does use Medicaid for her transportation to appointments.She has been unable to do some her interest and do household activities due to her back pain and shortness of breath,      Family Dynamics   Good Support System? No    Strains Intra-family strains    Concerns No support system    Comments She states that she has a "friend" that has been a big help for her with things at the home, because her uncle has refussed to fix things there. Lerae states doesn't have a lot of friends or relatives that she can count on to help her. She states that she and her mother do not have a good relationship.      Barriers   Psychosocial barriers to participate in program Psychosocial barriers identified (see note);The patient should benefit from training in stress management and relaxation.   Her living situation, fear of being homeless and not being approved for disability has caused her a great deal of stress.     Screening Interventions   Interventions Encouraged to exercise;To provide support and resources with identified psychosocial needs;Other (comment)    Comments Had a hospital social worker come to see pt to see if she could provide any additional help with her situation.    Expected Outcomes Short Term goal: Utilizing psychosocial counselor, staff and physician to assist with identification of specific Stressors or current issues interfering with healing process. Setting desired goal for each stressor or current issue identified.;Long Term Goal: Stressors or current issues are controlled or eliminated.;Short Term goal: Identification and review with  participant of any Quality of Life or Depression concerns found by scoring the questionnaire.;Long Term goal: The participant improves quality of Life and PHQ9 Scores as seen by post scores and/or verbalization of changes             Quality of Life Scores:  Scores of 19 and below usually indicate a poorer quality of life in these areas.  A difference of  2-3 points is a clinically meaningful difference.  A difference of 2-3 points in the total score of the Quality of Life Index has been associated with significant improvement in overall quality of life, self-image, physical symptoms, and general health in studies assessing change in quality of life.  PHQ-9: Recent Review Flowsheet Data     Depression screen [facility] 2/9 01/28/2021 12/01/2020 10/22/2020 05/27/2020 10/23/2019   Decreased Interest '2 1 3 3 1   ' Down, Depressed, Hopeless '3 1 3 3 3   ' PHQ - 2 Score '5 2 6 6 4   ' Altered sleeping '1 2 1 2 1   ' Tired, decreased energy 1 1 0 2 1   Change in appetite 0 2 0 2 0   Feeling bad or failure about yourself  '1 2 1 2 1   ' Trouble concentrating '1 2 1 2 2   ' Moving slowly or fidgety/restless 0 0 1 0 0   Suicidal thoughts '1 1 1  1 1   ' PHQ-9 Score '10 12 11 17 10   ' Difficult doing work/chores Somewhat difficult - Very difficult - -      Interpretation of Total Score  Total Score Depression Severity:  1-4 =  Minimal depression, 5-9 = Mild depression, 10-14 = Moderate depression, 15-19 = Moderately severe depression, 20-27 = Severe depression   Psychosocial Evaluation and Intervention:  Psychosocial Evaluation - 01/28/21 1251       Psychosocial Evaluation & Interventions   Interventions Stress management education;Relaxation education;Encouraged to exercise with the program and follow exercise prescription    Comments Rinda has biweekly appointments with online therapist and is on medications for depression. She states that she feels all of this has helped her situation.    Expected Outcomes That  Mckynzie's  psychosocial issues will not proibit her from particiaption in the PR program and be able to manage her stress in a healthy way.    Continue Psychosocial Services  Follow up required by staff             Psychosocial Re-Evaluation:  Psychosocial Re-Evaluation     [place] Name 02/18/21 1600 03/18/21 1551           Psychosocial Re-Evaluation   Current issues with Current Depression;History of Depression;Current Anxiety/Panic Current Depression;History of Depression;Current Stress Concerns      Comments Blessin situation has improved that she has been approved for her disabiltiy. She is vey excited about trying to find heself a place to live. She feels this will significantly improve her depression and anxiety about becoming homeless. Despite her psychosocial barriers she has been able to participate in the PR program and do well. She has attended 4 sessions and been able to to increase her workloads and MET levels. She missed 1 class due to a migraine headache. Zaylei is on medication for her depression and is seeing a therapist. She has been approved for her disability recently and this has helped with her depression and stress. She is currently looking for an apartment to rent. Despite her psychosocial barriers she has been able to attend the PR program and progress well.      Expected Outcomes For Samaiyah to continue to progress with her workloads and MET levels.Assist her as we are able with her moving forward in her new living situation. For her to continue to participate in PR despite her psychosocial conernc.      Interventions Encouraged to attend Pulmonary Rehabilitation for the exercise Encouraged to attend Pulmonary Rehabilitation for the exercise      Continue Psychosocial Services  Follow up required by staff Follow up required by staff        Initial Review   Source of Stress Concerns -- Chronic Illness;Family;Financial;Unable to participate in former interests or  hobbies;Unable to perform yard/household activities      Comments -- Her issues have improved since her recent approval for disability.               Psychosocial Discharge (Final Psychosocial Re-Evaluation):  Psychosocial Re-Evaluation - 03/18/21 1551       Psychosocial Re-Evaluation   Current issues with Current Depression;History of Depression;Current Stress Concerns    Comments Eiliana is on medication for her depression and is seeing a therapist. She has been approved for her disability recently and this has helped with her depression and stress. She is currently looking for an apartment to rent. Despite her psychosocial barriers she has been able to attend the PR program and progress well.    Expected Outcomes For her to continue to participate in PR despite her psychosocial conernc.    Interventions Encouraged to attend Pulmonary Rehabilitation for the exercise    Continue Psychosocial Services  Follow up required by  staff      Initial Review   Source of Stress Concerns Chronic Illness;Family;Financial;Unable to participate in former interests or hobbies;Unable to perform yard/household activities    Comments Her issues have improved since her recent approval for disability.             Education: Education Goals: Education classes will be provided on a weekly basis, covering required topics. Participant will state understanding/return demonstration of topics presented.  Learning Barriers/Preferences:   Education Topics: Risk Factor Reduction:  -Group instruction that is supported by a PowerPoint presentation. Instructor discusses the definition of a risk factor, different risk factors for pulmonary disease, and how the heart and lungs work together.     Nutrition for Pulmonary Patient:  -Group instruction provided by PowerPoint slides, verbal discussion, and written materials to support subject matter. The instructor gives an explanation and review of healthy diet  recommendations, which includes a discussion on weight management, recommendations for fruit and vegetable consumption, as well as protein, fluid, caffeine, fiber, sodium, sugar, and alcohol. Tips for eating when patients are short of breath are discussed. Flowsheet Row PULMONARY REHAB CHRONIC OBSTRUCTIVE PULMONARY DISEASE from 03/05/2021 in [place]  Date 02/05/21  Educator Handout       Pursed Lip Breathing:  -Group instruction that is supported by demonstration and informational handouts. Instructor discusses the benefits of pursed lip and diaphragmatic breathing and detailed demonstration on how to preform both.   Flowsheet Row PULMONARY REHAB CHRONIC OBSTRUCTIVE PULMONARY DISEASE from 03/12/2021 in [place]  Date 03/12/21  Educator Donnetta Simpers  Instruction Review Code 1- Verbalizes Understanding       Oxygen Safety:  -Group instruction provided by PowerPoint, verbal discussion, and written material to support subject matter. There is an overview of What is Oxygen and Why do we need it.  Instructor also reviews how to create a safe environment for oxygen use, the importance of using oxygen as prescribed, and the risks of noncompliance. There is a brief discussion on traveling with oxygen and resources the patient may utilize.   Oxygen Equipment:  -Group instruction provided by [facility] Staff utilizing handouts, written materials, and equipment demonstrations.   Signs and Symptoms:  -Group instruction provided by written material and verbal discussion to support subject matter. Warning signs and symptoms of infection, stroke, and heart attack are reviewed and when to call the physician/911 reinforced. Tips for preventing the spread of infection discussed.   Advanced Directives:  -Group instruction provided by verbal instruction and written material to support subject matter. Instructor reviews Advanced Directive  laws and proper instruction for filling out document.   Pulmonary Video:  -Group video education that reviews the importance of medication and oxygen compliance, exercise, good nutrition, pulmonary hygiene, and pursed lip and diaphragmatic breathing for the pulmonary patient.   Exercise for the Pulmonary Patient:  -Group instruction that is supported by a PowerPoint presentation. Instructor discusses benefits of exercise, core components of exercise, frequency, duration, and intensity of an exercise routine, importance of utilizing pulse oximetry during exercise, safety while exercising, and options of places to exercise outside of rehab.   Flowsheet Row PULMONARY REHAB CHRONIC OBSTRUCTIVE PULMONARY DISEASE from 03/05/2021 in [place]  Date 02/19/21  Educator --  [Handout]       Pulmonary Medications:  -Verbally interactive group education provided by instructor with focus on inhaled medications and proper administration. Flowsheet Row PULMONARY REHAB CHRONIC OBSTRUCTIVE PULMONARY DISEASE from  03/05/2021 in [place]  Date 02/26/21  Educator Donnetta Simpers  [Handout]       Anatomy and Physiology of the Respiratory System and Intimacy:  -Group instruction provided by PowerPoint, verbal discussion, and written material to support subject matter. Instructor reviews respiratory cycle and anatomical components of the respiratory system and their functions. Instructor also reviews differences in obstructive and restrictive respiratory diseases with examples of each. Intimacy, Sex, and Sexuality differences are reviewed with a discussion on how relationships can change when diagnosed with pulmonary disease. Common sexual concerns are reviewed.   MD DAY -A group question and answer session with a medical doctor that allows participants to ask questions that relate to their pulmonary disease state.   OTHER EDUCATION -Group or  individual verbal, written, or video instructions that support the educational goals of the pulmonary rehab program. [place] from 03/05/2021 in [place]  Date 03/05/21  Educator My Plate       Holiday Eating Survival Tips:  -Group instruction provided by PowerPoint slides, verbal discussion, and written materials to support subject matter. The instructor gives patients tips, tricks, and techniques to help them not only survive but enjoy the holidays despite the onslaught of food that accompanies the holidays.   Knowledge Questionnaire Score:  Knowledge Questionnaire Score - 01/28/21 1205       Knowledge Questionnaire Score   Pre Score 15/18             Core Components/Risk Factors/Patient Goals at Admission:  Personal Goals and Risk Factors at Admission - 01/28/21 1258       Core Components/Risk Factors/Patient Goals on Admission   Tobacco Cessation Yes    Number of packs per day Some days none, on the days that her uncle causes her stress 4-5 cigarettes per day.    Intervention Assist the participant in steps to quit. Provide individualized education and counseling about committing to Tobacco Cessation, relapse prevention, and pharmacological support that can be provided by physician.;Advice worker, assist with locating and accessing local/national Quit Smoking programs, and support quit date choice.    Expected Outcomes Short Term: Will demonstrate readiness to quit, by selecting a quit date.;Short Term: Will quit all tobacco product use, adhering to prevention of relapse plan.;Long Term: Complete abstinence from all tobacco products for at least 12 months from quit date.    Improve shortness of breath with ADL's Yes    Intervention Provide education, individualized exercise plan and daily activity instruction to help decrease symptoms of SOB with activities of daily  living.    Expected Outcomes Short Term: Improve cardiorespiratory fitness to achieve a reduction of symptoms when performing ADLs;Long Term: Be able to perform more ADLs without symptoms or delay the onset of symptoms    Increase knowledge of respiratory medications and ability to use respiratory devices properly  Yes    Intervention Provide education and demonstration as needed of appropriate use of medications, inhalers, and oxygen therapy.    Expected Outcomes Short Term: Achieves understanding of medications use. Understands that oxygen is a medication prescribed by physician. Demonstrates appropriate use of inhaler and oxygen therapy.;Long Term: Maintain appropriate use of medications, inhalers, and oxygen therapy.             Core Components/Risk Factors/Patient Goals Review:   Goals and Risk Factor Review     Row Name 02/18/21 1616 03/18/21 1559  Core Components/Risk Factors/Patient Goals Review   Personal Goals Review Tobacco Cessation;Improve shortness of breath with ADL's;Develop more efficient breathing techniques such as purse lipped breathing and diaphragmatic breathing and practicing self-pacing with activity.;Increase knowledge of respiratory medications and ability to use respiratory devices properly. Tobacco Cessation;Improve shortness of breath with ADL's;Develop more efficient breathing techniques such as purse lipped breathing and diaphragmatic breathing and practicing self-pacing with activity.;Increase knowledge of respiratory medications and ability to use respiratory devices properly.      Review Shanisha was off to a slow start with her progression of exercise. Her last session of exercise after finding out that she had been approved for her disabilityshe made a big imporvement. She increased her workload and MEt level on the recumbent bike and walked 14 laps on the track. She has been exercising on oxygen 2 liters her saturations have been from 91-97%. She has  no home oxygen. She has continued to smoke" some days are better that others." I has stressed the importants of total cessation, encouraged her to chose a quit date and use the 1800 quit line. She has not utilized the quit line and feels that until she is completely away from her living situation that it won't change due to the stress there.She is exercising on the recumbent bike and track progressing well. She now has her home oxygen and her portable tank. She is walking in on her oxygen to PR. Her oxygen saturations have been from 90-95% while exercising on 2 liters of oxygen.      Expected Outcomes For Falicia to continue to progress in her workloads and MET levels, Improve her SOB with ADL and to use better breathing techniques.Complete tobacco cessation. Some days she has none,other days she gets stressed and will smoke some. Total smoking cessation. Continue to progress with exercise levels and METs. Improve her SOB with ADL's and more efficient breathing techniques.               Core Components/Risk Factors/Patient Goals at Discharge (Final Review):   Goals and Risk Factor Review - 03/18/21 1559       Core Components/Risk Factors/Patient Goals Review   Personal Goals Review Tobacco Cessation;Improve shortness of breath with ADL's;Develop more efficient breathing techniques such as purse lipped breathing and diaphragmatic breathing and practicing self-pacing with activity.;Increase knowledge of respiratory medications and ability to use respiratory devices properly.    Review She has continued to smoke" some days are better that others." I has stressed the importants of total cessation, encouraged her to chose a quit date and use the 1800 quit line. She has not utilized the quit line and feels that until she is completely away from her living situation that it won't change due to the stress there.She is exercising on the recumbent bike and track progressing well. She now has her home oxygen and  her portable tank. She is walking in on her oxygen to PR. Her oxygen saturations have been from 90-95% while exercising on 2 liters of oxygen.    Expected Outcomes Total smoking cessation. Continue to progress with exercise levels and METs. Improve her SOB with ADL's and more efficient breathing techniques.             ITP Comments:   Comments: ITP REVIEW Pt is making expected progress toward pulmonary rehab goals after completing 12 sessions. Recommend continued exercise, life style modification, education, and utilization of breathing techniques to increase stamina and strength and decrease shortness of breath with exertion.  Dr.  Rodman Pickle is Medical Director for Pulmonary Rehab at [facility].

## 2021-03-19 ENCOUNTER — Encounter: Payer: Self-pay | Admitting: Physical Medicine and Rehabilitation

## 2021-03-19 ENCOUNTER — Encounter (HOSPITAL_COMMUNITY): Payer: Medicaid Other

## 2021-03-19 ENCOUNTER — Other Ambulatory Visit: Payer: Self-pay

## 2021-03-19 ENCOUNTER — Ambulatory Visit (INDEPENDENT_AMBULATORY_CARE_PROVIDER_SITE_OTHER): Payer: Medicaid Other | Admitting: Physical Medicine and Rehabilitation

## 2021-03-19 ENCOUNTER — Ambulatory Visit: Payer: Self-pay

## 2021-03-19 VITALS — BP 108/66 | HR 83

## 2021-03-19 DIAGNOSIS — M5416 Radiculopathy, lumbar region: Secondary | ICD-10-CM

## 2021-03-19 MED ORDER — METHYLPREDNISOLONE ACETATE 80 MG/ML IJ SUSP
80.0000 mg | Freq: Once | INTRAMUSCULAR | Status: AC
  Administered 2021-03-19: 80 mg

## 2021-03-19 NOTE — Patient Instructions (Signed)

## 2021-03-19 NOTE — Progress Notes (Signed)
Pt state lower back pain that travels to her buttocks and down to both legs. Pt state walking, standing and laying down makes the pain worse. Pt state she takes over the counter pain meds and uses heat to help ease her pain. ? ?Numeric Pain Rating Scale and Functional Assessment ?Average Pain 8 ? ? ?In the last MONTH (on 0-10 scale) has pain interfered with the following? ? ?1. General activity like being  able to carry out your everyday physical activities such as walking, climbing stairs, carrying groceries, or moving a chair?  ?Rating(10) ? ? ?+Driver, -BT, -Dye Allergies. ? ?

## 2021-03-20 ENCOUNTER — Ambulatory Visit: Payer: Medicaid Other

## 2021-03-20 ENCOUNTER — Encounter: Payer: Self-pay | Admitting: Pulmonary Disease

## 2021-03-20 DIAGNOSIS — J449 Chronic obstructive pulmonary disease, unspecified: Secondary | ICD-10-CM

## 2021-03-20 MED ORDER — DOXYCYCLINE HYCLATE 100 MG PO TABS: 100.0000 mg | ORAL_TABLET | Freq: Two times a day (BID) | ORAL | 0 refills | Status: DC

## 2021-03-20 NOTE — Telephone Encounter (Signed)
Tammy, Dr [name] not available today- please advise on email, thanks! ? ?No appt openings today  ? ?Abbegale Kipper ?to [facility] (supporting Rigoberto Noel, MD)   ?   9:38 AM ?I had 2 steroid injections yesterday for my back ?Shiesha Reisen ?to [facility] (supporting Rigoberto Noel, MD)   ?   9:22 AM ?No fever ?Wylodean Suastegui ?to [facility] (supporting Rigoberto Noel, MD)   ?   9:22 AM ?No I usually use mucus relief from [employer]. A little wheezing. Thank you for your concern ?Assyria Loken ?to [facility] (supporting Rigoberto Noel, MD)   ?   9:19 AM ?It started yesterday with coughing and mucus yellowish color had to use rescue inhaler around 3 timrs in the day. Yes i am using my Spiriva and Symbicort. ?Pinion, Waldemar Dickens, CMA ?to [employer]   ?   9:09 AM ?Lynda, ?  ?I am sorry to hear that you are not feeling well. When did you start not feeling well?  Any coughing, and if so are you coughing up any mucus? Any wheezing or chest discomfort? Any fever? ?  ?Are you still using your Symbicort and Spiriva inhalers as prescribed? Have you had to use your rescue inhaler any and if so, how many times in a day since all this started?  Any other medications than what you are currently prescribed that you have tried to see if it would help with your symptoms? ? ?Last read by Luna Fuse at  9:37 AM on 03/20/2021. ?Brendaliz Ortez ?to [facility] (supporting Rigoberto Noel, MD)   ?   8:54 AM ?Good morning Dr. [name] hope all is well. I am getting an upper respiratory infection from the change of weather. I was wondering if you can send some antibiotics over to [facility] on [address]. Thanks ?

## 2021-03-20 NOTE — Telephone Encounter (Signed)
Can begin doxycycline 100 mg twice daily for 1 week, take with food ?Mucinex DM twice daily as needed for cough and congestion ?continue on current regimen\ ? ?Would like for her to come over for a chest x-ray-COPD exacerbation ? ?If not improving will need office visit ? ?Please contact office for sooner follow up if symptoms do not improve or worsen or seek emergency care  ? ?

## 2021-03-24 ENCOUNTER — Other Ambulatory Visit: Payer: Self-pay

## 2021-03-24 ENCOUNTER — Encounter (HOSPITAL_COMMUNITY)
Admission: RE | Admit: 2021-03-24 | Discharge: 2021-03-24 | Disposition: A | Discharge status: Home / Self Care | Payer: Medicaid Other | Source: Ambulatory Visit | Attending: Pulmonary Disease | Admitting: Pulmonary Disease

## 2021-03-24 VITALS — Wt 165.1 lb

## 2021-03-24 DIAGNOSIS — J449 Chronic obstructive pulmonary disease, unspecified: Secondary | ICD-10-CM

## 2021-03-24 NOTE — Progress Notes (Signed)
Daily Session Note ? ?Patient Details  ?Name: Cheryl King ?MRN: [id number] ?Date of Birth: 1965-04-06 ?Referring Provider:   ?Flowsheet Row Pulmonary Rehab Walk Test from 01/28/2021 in [place]  ?Referring Provider [name]  ? ?  ? ? ?Encounter Date: 03/24/2021 ? ?Check In: ? Session Check In - 03/24/21 1124   ? ?  ? Check-In  ? Supervising physician immediately available to respond to emergencies [facility] immediately available   ? Physician(s) Dr. Cruzita Lederer   ? Location [facility]   ? Staff Present Elmon Else, MS, ACSM-CEP, Exercise Physiologist;Lisa Ysidro Evert, RN;Portia Rollene Rotunda, RN, BSN;Carlette Wilber Oliphant, RN, BSN   ? Virtual Visit No   ? Medication changes reported     No   ? Fall or balance concerns reported    No   ? Tobacco Cessation No Change   ? Resistance Training Performed Yes   ? VAD Patient? No   ? PAD/SET Patient? No   ?  ? Pain Assessment  ? Currently in Pain? No/denies   ? Multiple Pain Sites No   ? ?  ?  ? ?  ? ? ?Capillary Blood Glucose: ?No results found for this or any previous visit (from the past 24 hour(s)). ? ? Exercise Prescription Changes - 03/24/21 1100   ? ?  ? Response to Exercise  ? Blood Pressure (Admit) 114/64   ? Blood Pressure (Exercise) 140/60   ? Blood Pressure (Exit) 98/60   ? Heart Rate (Admit) 76 bpm   ? Heart Rate (Exercise) 96 bpm   ? Heart Rate (Exit) 81 bpm   ? Oxygen Saturation (Admit) 98 %   ? Oxygen Saturation (Exercise) 93 %   ? Oxygen Saturation (Exit) 96 %   ? Rating of Perceived Exertion (Exercise) 11   ? Perceived Dyspnea (Exercise) 1   ? Duration Continue with 30 min of aerobic exercise without signs/symptoms of physical distress.   ? Intensity THRR unchanged   ?  ? Progression  ? Progression Continue to progress workloads to maintain intensity without signs/symptoms of physical distress.   ?  ? Resistance Training  ? Training Prescription Yes   ? Weight blue bands   ? Reps 10-15   ? Time 10 Minutes   ?  ? Oxygen   ? Oxygen Continuous   ? Liters 2   ?  ? Recumbant Bike  ? Level 3   ? Minutes 15   ? METs 4   ?  ? Track  ? Laps 16   ? Minutes 15   ? METs 2.86   ?  ? Oxygen  ? Maintain Oxygen Saturation 88% or higher   ? ?  ?  ? ?  ? ? ?Social History  ? ?Tobacco Use  ?Smoking Status Some Days  ? Packs/day: 0.25  ? Years: 35.00  ? Pack years: 8.75  ? Types: Cigarettes  ? Start date: 21  ?Smokeless Tobacco Never  ?Tobacco Comments  ? Wears patches, 5 cigarettes smoked on some days especially the days that her uncle stress her with leaving the home.Some days smokes none when her uncle is out of town and not in the home.  ? ? ?Goals Met:  ?Proper associated with RPD/PD & O2 Sat ?Independence with exercise equipment ?Exercise tolerated well ?No report of concerns or symptoms today ?Strength training completed today ? ?Goals Unmet:  ?Not Applicable ? ?Comments: Service time is from 1022 to 1142.  ? ? ?Dr. Opal Sidles  Loanne Drilling is Market researcher for Pulmonary Rehab at [facility].  ?

## 2021-03-25 ENCOUNTER — Ambulatory Visit (INDEPENDENT_AMBULATORY_CARE_PROVIDER_SITE_OTHER): Payer: Medicaid Other

## 2021-03-25 DIAGNOSIS — J449 Chronic obstructive pulmonary disease, unspecified: Secondary | ICD-10-CM

## 2021-03-25 DIAGNOSIS — J441 Chronic obstructive pulmonary disease with (acute) exacerbation: Secondary | ICD-10-CM | POA: Diagnosis not present

## 2021-03-26 ENCOUNTER — Other Ambulatory Visit: Payer: Self-pay

## 2021-03-26 ENCOUNTER — Encounter (HOSPITAL_COMMUNITY)
Admission: RE | Admit: 2021-03-26 | Discharge: 2021-03-26 | Disposition: A | Discharge status: Home / Self Care | Payer: Medicaid Other | Source: Ambulatory Visit | Attending: Pulmonary Disease | Admitting: Pulmonary Disease

## 2021-03-26 DIAGNOSIS — J449 Chronic obstructive pulmonary disease, unspecified: Secondary | ICD-10-CM

## 2021-03-26 NOTE — Progress Notes (Signed)
Daily Session Note ? ?Patient Details  ?Name: Cheryl King ?MRN: [id number] ?Date of Birth: [birth year] ?Referring Provider:   ?Flowsheet Row Pulmonary Rehab Walk Test from 01/28/2021 in [place]  ?Referring Provider [name]  ? ?  ? ? ?Encounter Date: 03/26/2021 ? ?Check In: ? Session Check In - 03/26/21 1137   ? ?  ? Check-In  ? Supervising physician immediately available to respond to emergencies [facility] immediately available   ? Physician(s) Dr. Alfredia Ferguson   ? Location [facility]   ? Staff Present Rodney Langton, Cathleen Fears, MS, ACSM-CEP, Exercise Physiologist;Carlette Wilber Oliphant, RN, BSN   ? Virtual Visit No   ? Medication changes reported     No   ? Fall or balance concerns reported    No   ? Tobacco Cessation No Change   ? Warm-up and Cool-down Performed as group-led instruction   ? Resistance Training Performed Yes   ? VAD Patient? No   ? PAD/SET Patient? No   ?  ? Pain Assessment  ? Currently in Pain? No/denies   ? Multiple Pain Sites No   ? ?  ?  ? ?  ? ? ?Capillary Blood Glucose: ?No results found for this or any previous visit (from the past 24 hour(s)). ? ? ? ?Social History  ? ?Tobacco Use  ?Smoking Status Some Days  ? Packs/day: 0.25  ? Years: 35.00  ? Pack years: 8.75  ? Types: Cigarettes  ? Start date: 38  ?Smokeless Tobacco Never  ?Tobacco Comments  ? Wears patches, 5 cigarettes smoked on some days especially the days that her uncle stress her with leaving the home.Some days smokes none when her uncle is out of town and not in the home.  ? ? ?Goals Met:  ?Exercise tolerated well ?No report of concerns or symptoms today ?Strength training completed today ? ?Goals Unmet:  ?Not Applicable ? ?Comments: Service time is from 1029 to 1145 ? ? ? ?Dr. Rodman Pickle is Medical Director for Pulmonary Rehab at [facility].  ?

## 2021-03-30 ENCOUNTER — Encounter: Payer: Self-pay | Admitting: Pulmonary Disease

## 2021-03-30 ENCOUNTER — Ambulatory Visit: Payer: Medicaid Other | Admitting: Pulmonary Disease

## 2021-03-30 ENCOUNTER — Other Ambulatory Visit: Payer: Self-pay

## 2021-03-30 DIAGNOSIS — J432 Centrilobular emphysema: Secondary | ICD-10-CM

## 2021-03-30 DIAGNOSIS — J9611 Chronic respiratory failure with hypoxia: Secondary | ICD-10-CM

## 2021-03-30 DIAGNOSIS — F4310 Post-traumatic stress disorder, unspecified: Secondary | ICD-10-CM | POA: Diagnosis not present

## 2021-03-30 DIAGNOSIS — Z720 Tobacco use: Secondary | ICD-10-CM

## 2021-03-30 DIAGNOSIS — F331 Major depressive disorder, recurrent, moderate: Secondary | ICD-10-CM | POA: Diagnosis not present

## 2021-03-30 NOTE — Procedures (Signed)
Lumbosacral Transforaminal Epidural Steroid Injection - Sub-Pedicular Approach with Fluoroscopic Guidance ? ?Patient: Cheryl King      ?Date of Birth: 10/02/1965 ?MRN: [id number] ?PCP: Charlott Rakes, MD      ?Visit Date: 03/19/2021 ?  ?Universal Protocol:    ?Date/Time: 03/19/2021 ? ?Consent Given By: the patient ? ?Position: PRONE ? ?Additional Comments: ?Vital signs were monitored before and after the procedure. ?Patient was prepped and draped in the usual sterile fashion. ?The correct patient, procedure, and site was verified. ? ? ?Injection Procedure Details:  ? ?Procedure diagnoses: Lumbar radiculopathy [M54.16]   ? ?Meds Administered:  ?Meds ordered this encounter  ?Medications  ? methylPREDNISolone acetate (DEPO-MEDROL) injection 80 mg  ? ? ?Laterality: Bilateral ? ?Location/Site: L5 ? ?Needle:5.0 in., 22 ga.  Short bevel or Quincke spinal needle ? ?Needle Placement: Transforaminal ? ?Findings: ?  ? -Comments: Excellent flow of contrast along the nerve, nerve root and into the epidural space. ? ?Procedure Details: ?After squaring off the end-plates to get a true AP view, the C-arm was positioned so that an oblique view of the foramen as noted above was visualized. The target area is just inferior to the "nose of the scotty dog" or sub pedicular. The soft tissues overlying this structure were infiltrated with 2-3 ml. of 1% Lidocaine without Epinephrine. ? ?The spinal needle was inserted toward the target using a "trajectory" view along the fluoroscope beam.  Under AP and lateral visualization, the needle was advanced so it did not puncture dura and was located close the 6 O'Clock position of the pedical in AP tracterory. Biplanar projections were used to confirm position. Aspiration was confirmed to be negative for CSF and/or blood. A 1-2 ml. volume of Isovue-250 was injected and flow of contrast was noted at each level. Radiographs were obtained for documentation purposes.  ? ?After attaining the  desired flow of contrast documented above, a 0.5 to 1.0 ml test dose of 0.25% Marcaine was injected into each respective transforaminal space.  The patient was observed for 90 seconds post injection.  After no sensory deficits were reported, and normal lower extremity motor function was noted,   the above injectate was administered so that equal amounts of the injectate were placed at each foramen (level) into the transforaminal epidural space. ? ? ?Additional Comments:  ?The patient tolerated the procedure well ?Dressing: 2 x 2 sterile gauze and Band-Aid ?  ? ?Post-procedure details: ?Patient was observed during the procedure. ?Post-procedure instructions were reviewed. ? ?Patient left the clinic in stable condition. ? ?

## 2021-03-30 NOTE — Progress Notes (Signed)
? ?Cheryl King - 56 y.o. female MRN [id number]  Date of birth: [birth year] ? ?Office Visit Note: ?Visit Date: 03/19/2021 ?PCP: Charlott Rakes, MD ?Referred by: Charlott Rakes, MD ? ?Subjective: ?Chief Complaint  ?Patient presents with  ? Lower Back - Pain  ? Right Leg - Pain  ? Left Leg - Pain  ? ?HPI:  Cheryl King is a 56 y.o. female who comes in today at the request of Dr. Basil Dess for planned Bilateral L5-S1 Lumbar Transforaminal epidural steroid injection with fluoroscopic guidance.  The patient has failed conservative care including home exercise, medications, time and activity modification.  This injection will be diagnostic and hopefully therapeutic.  Please see requesting physician notes for further details and justification. ? ?ROS Otherwise per HPI. ? ?Assessment & Plan: ?Visit Diagnoses:  ?  ICD-10-CM   ?1. Lumbar radiculopathy  M54.16 XR C-ARM NO REPORT  ?  Epidural Steroid injection  ?  methylPREDNISolone acetate (DEPO-MEDROL) injection 80 mg  ?  ?  ?Plan: No additional findings.  ? ?Meds & Orders:  ?Meds ordered this encounter  ?Medications  ? methylPREDNISolone acetate (DEPO-MEDROL) injection 80 mg  ?  ?Orders Placed This Encounter  ?Procedures  ? XR C-ARM NO REPORT  ? Epidural Steroid injection  ?  ?Follow-up: Return for visit to requesting provider as needed.  ? ?Procedures: ?No procedures performed  ?Lumbosacral Transforaminal Epidural Steroid Injection - Sub-Pedicular Approach with Fluoroscopic Guidance ? ?Patient: Cheryl King      ?Date of Birth: 06-17-1965 ?MRN: [id number] ?PCP: Charlott Rakes, MD      ?Visit Date: 03/19/2021 ?  ?Universal Protocol:    ?Date/Time: 03/19/2021 ? ?Consent Given By: the patient ? ?Position: PRONE ? ?Additional Comments: ?Vital signs were monitored before and after the procedure. ?Patient was prepped and draped in the usual sterile fashion. ?The correct patient, procedure, and site was verified. ? ? ?Injection Procedure Details:  ? ?Procedure  diagnoses: Lumbar radiculopathy [M54.16]   ? ?Meds Administered:  ?Meds ordered this encounter  ?Medications  ? methylPREDNISolone acetate (DEPO-MEDROL) injection 80 mg  ? ? ?Laterality: Bilateral ? ?Location/Site: L5 ? ?Needle:5.0 in., 22 ga.  Short bevel or Quincke spinal needle ? ?Needle Placement: Transforaminal ? ?Findings: ?  ? -Comments: Excellent flow of contrast along the nerve, nerve root and into the epidural space. ? ?Procedure Details: ?After squaring off the end-plates to get a true AP view, the C-arm was positioned so that an oblique view of the foramen as noted above was visualized. The target area is just inferior to the "nose of the scotty dog" or sub pedicular. The soft tissues overlying this structure were infiltrated with 2-3 ml. of 1% Lidocaine without Epinephrine. ? ?The spinal needle was inserted toward the target using a "trajectory" view along the fluoroscope beam.  Under AP and lateral visualization, the needle was advanced so it did not puncture dura and was located close the 6 O'Clock position of the pedical in AP tracterory. Biplanar projections were used to confirm position. Aspiration was confirmed to be negative for CSF and/or blood. A 1-2 ml. volume of Isovue-250 was injected and flow of contrast was noted at each level. Radiographs were obtained for documentation purposes.  ? ?After attaining the desired flow of contrast documented above, a 0.5 to 1.0 ml test dose of 0.25% Marcaine was injected into each respective transforaminal space.  The patient was observed for 90 seconds post injection.  After no sensory deficits were reported, and normal lower extremity motor function was noted,  the above injectate was administered so that equal amounts of the injectate were placed at each foramen (level) into the transforaminal epidural space. ? ? ?Additional Comments:  ?The patient tolerated the procedure well ?Dressing: 2 x 2 sterile gauze and Band-Aid ?  ? ?Post-procedure  details: ?Patient was observed during the procedure. ?Post-procedure instructions were reviewed. ? ?Patient left the clinic in stable condition. ?  ? ?Clinical History: ?MRI LUMBAR SPINE WITHOUT AND WITH CONTRAST ?  ?TECHNIQUE: ?Multiplanar and multiecho pulse sequences of the lumbar spine were ?obtained without and with intravenous contrast. ?  ?CONTRAST:  61m MULTIHANCE GADOBENATE DIMEGLUMINE 529 MG/ML IV SOLN ?  ?COMPARISON:  01/05/2017 ?  ?FINDINGS: ?Segmentation:  5 lumbar type vertebrae ?  ?Alignment:  Mild scoliosis.  No listhesis ?  ?Vertebrae: No fracture, evidence of discitis, or bone lesion. ?Endplate sclerosis has developed at L2-3 and L5-S1, degenerative ?appearing. ?  ?Conus medullaris and cauda equina: Conus extends to the L1 level. ?Conus and cauda equina appear normal. ?  ?Paraspinal and other soft tissues: No perispinal mass or ?inflammation noted. ?  ?Disc levels: ?  ?T11-12: Chronic disc collapse with ventral protrusion. ?  ?T12- L1: Unremarkable. ?  ?L1-L2: Disc narrowing and bulging with endplate degeneration. Patent ?canal and foramina ?  ?L2-L3: Disc narrowing and bulging with endplate degeneration. ?Circumferential disc bulging and facet spurring. Mild or moderate ?left foraminal narrowing ?  ?L3-L4: Disc narrowing and bulging with facet spurring. Patent canal ?and foramina ?  ?L4-L5: Disc narrowing and bulging. Degenerative facet spurring ?greater on the right. Mild-to-moderate right foraminal narrowing ?  ?L5-S1:Disc collapse and endplate degeneration with bulge. ?Degenerative facet spurring on the right more than left. Biforaminal ?impingement, worse on the right and progressed. ?  ?IMPRESSION: ?1. Generalized lumbar spine degeneration with progression from 2018, ?especially at L5-S1 where there is right more than left foraminal ?impingement. ?2. Diffusely patent spinal canal. ?3. Mild scoliosis. ?  ?  ?Electronically Signed ?  By: JJorje GuildM.D. ?  On: 09/30/2020 09:57   ? ? ? ?Objective:  VS:  HT:    WT:   BMI:     BP:108/66  HR:83bpm  TEMP: ( )  RESP:  ?Physical Exam ?Vitals and nursing note reviewed.  ?Constitutional:   ?   General: She is not in acute distress. ?   Appearance: Normal appearance. She is not ill-appearing.  ?HENT:  ?   Head: Normocephalic and atraumatic.  ?   Right Ear: External ear normal.  ?   Left Ear: External ear normal.  ?Eyes:  ?   Extraocular Movements: Extraocular movements intact.  ?Cardiovascular:  ?   Rate and Rhythm: Normal rate.  ?   Pulses: Normal pulses.  ?Pulmonary:  ?   Effort: Pulmonary effort is normal. No respiratory distress.  ?Abdominal:  ?   General: There is no distension.  ?   Palpations: Abdomen is soft.  ?Musculoskeletal:     ?   General: Tenderness present.  ?   Cervical back: Neck supple.  ?   Right lower leg: No edema.  ?   Left lower leg: No edema.  ?   Comments: Patient has good distal strength with no pain over the greater trochanters.  No clonus or focal weakness.  ?Skin: ?   Findings: No erythema, lesion or rash.  ?Neurological:  ?   General: No focal deficit present.  ?   Mental Status: She is alert and oriented to person, place, and time.  ?  Sensory: No sensory deficit.  ?   Motor: No weakness or abnormal muscle tone.  ?   Coordination: Coordination normal.  ?Psychiatric:     ?   Mood and Affect: Mood normal.     ?   Behavior: Behavior normal.  ?  ? ?Imaging: ?No results found. ?

## 2021-03-30 NOTE — Assessment & Plan Note (Signed)
We reviewed PFTs and CT scan. ?She will continue on Spiriva and Symbicort ?We will consider switching her to single agent triple therapy in the future ?

## 2021-03-30 NOTE — Assessment & Plan Note (Signed)
Smoking cessation again emphasized is the most important intervention that would add years to her life. ?Encouraged her to set another quit date but she is not ready ?

## 2021-03-30 NOTE — Progress Notes (Signed)
Called and spoke with patient, provided results per Rexene Edison NP.  She verbalized understanding.  She states she just left our office from seeing Dr. [name].  Nothing further needed.

## 2021-03-30 NOTE — Assessment & Plan Note (Signed)
Emphasize oxygen use during exertion 2 L and 1 L during sleep. ?Encouraged her to complete pulmonary rehab and improve her conditioning ?

## 2021-03-30 NOTE — Patient Instructions (Signed)
?  Continue on symbicort & spiriva ? ?We reviewed breathing test & scan today ?

## 2021-03-30 NOTE — Progress Notes (Signed)
? ?  Subjective:  ? ? Patient ID: Cheryl King, female    DOB: November 12, 1965, 56 y.o.   MRN: [id number] ? ?HPI ? ?56 year old smoker for FU of COPD ?She smokes about half pack per day starting as a teenager for 40 years, more than 20 pack years ? ?1 month follow-up visit.  She was treated for COPD flare in February with doxycycline and prednisone and seems to have recovered.  Chest x-ray was reviewed which shows hyperinflation without infiltrates. ? ?We reviewed previous CT chest and PFTs today showing severe airway obstruction.  She has started on pulmonary rehab and was noted to desaturate to 84% and was started on oxygen.  She only uses this during exertion and not at rest or during sleep. ?She is maintained on a regimen of Symbicort and Spiriva and is compliant with this. ?She was able to quit smoking for 2 weeks using nicotine patches but is now back to smoking again less than half a pack per day ? ?Past Medical History:  ?Diagnosis Date  ? Acid reflux   ? Allergy   ? Carpal tunnel syndrome   ? COPD (chronic obstructive pulmonary disease) ([place])   ? Depression   ? Diverticulitis   ? Hyperlipidemia   ? Low back pain   ? Neck pain   ? 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 Sciatica   ? Seasonal allergies   ? ? ? ? ?Significant tests/ events reviewed ? ?09/2020 PFTs moderate airway obstruction, ratio 52, FEV1 1.01/44%, FVC 67% with 13% bronchodilator response, TLC 102%, DLCO 7.02/33% ? ?08/2020 CT chest wo con >> emphysema, stable nodule, PA 3.6 cm ? ?LDCT 06/2019 >>Moderate centrilobular emphysema. ?Numerous small solid pulmonary nodules scattered throughout both lungs, largest 5.0 mm. Main PA 3.2 cm ? ? ?Review of Systems ?neg for any significant sore throat, dysphagia, itching, sneezing, nasal congestion or excess/ purulent secretions, fever, chills, sweats, unintended wt loss, pleuritic or exertional cp, hempoptysis, orthopnea pnd or change in chronic leg swelling. Also denies presyncope, palpitations, heartburn,  abdominal pain, nausea, vomiting, diarrhea or change in bowel or urinary habits, dysuria,hematuria, rash, arthralgias, visual complaints, headache, numbness weakness or ataxia. ? ?   ?Objective:  ? Physical Exam ? ?Gen. Pleasant, obese, in no distress ?ENT - no lesions, no post nasal drip ?Neck: No JVD, no thyromegaly, no carotid bruits ?Lungs: no use of accessory muscles, no dullness to percussion, decreased without rales or rhonchi  ?Cardiovascular: Rhythm regular, heart sounds  normal, no murmurs or gallops, no peripheral edema ?Musculoskeletal: No deformities, no cyanosis or clubbing , no tremors ? ? ? ? ?   ?Assessment & Plan:  ? ? ?

## 2021-03-31 ENCOUNTER — Encounter (HOSPITAL_COMMUNITY): Payer: Medicaid Other

## 2021-03-31 ENCOUNTER — Telehealth (HOSPITAL_COMMUNITY): Payer: Self-pay

## 2021-03-31 ENCOUNTER — Telehealth (HOSPITAL_COMMUNITY): Payer: Self-pay | Admitting: Family Medicine

## 2021-03-31 NOTE — Telephone Encounter (Signed)
Returned [employer] call. She stated that she is feeling better and will be returning on Thursday.  ?

## 2021-04-01 DIAGNOSIS — K219 Gastro-esophageal reflux disease without esophagitis: Secondary | ICD-10-CM | POA: Diagnosis not present

## 2021-04-01 DIAGNOSIS — Z1211 Encounter for screening for malignant neoplasm of colon: Secondary | ICD-10-CM | POA: Diagnosis not present

## 2021-04-02 ENCOUNTER — Encounter (HOSPITAL_COMMUNITY)
Admission: RE | Admit: 2021-04-02 | Discharge: 2021-04-02 | Disposition: A | Discharge status: Home / Self Care | Payer: Medicaid Other | Source: Ambulatory Visit | Attending: Pulmonary Disease | Admitting: Pulmonary Disease

## 2021-04-02 ENCOUNTER — Other Ambulatory Visit: Payer: Self-pay

## 2021-04-02 ENCOUNTER — Telehealth (HOSPITAL_COMMUNITY): Payer: Self-pay

## 2021-04-02 VITALS — Wt 170.6 lb

## 2021-04-02 DIAGNOSIS — J449 Chronic obstructive pulmonary disease, unspecified: Secondary | ICD-10-CM | POA: Diagnosis not present

## 2021-04-02 NOTE — Progress Notes (Signed)
Daily Session Note ? ?Patient Details  ?Name: Cheryl King ?MRN: [id number] ?Date of Birth: 03/05/1965 ?Referring Provider:   ?Flowsheet Row Pulmonary Rehab Walk Test from 01/28/2021 in [place]  ?Referring Provider [name]  ? ?  ? ? ?Encounter Date: 04/02/2021 ? ?Check In: ? Session Check In - 04/02/21 1203   ? ?  ? Check-In  ? Supervising physician immediately available to respond to emergencies [facility] immediately available   ? Physician(s) Dr. Cruzita Lederer   ? Location [facility]   ? Staff Present Elmon Else, MS, ACSM-CEP, Exercise Physiologist;Joan Leonia Reeves, RN, BSN;Other   ? Virtual Visit No   ? Medication changes reported     No   ? Fall or balance concerns reported    No   ? Tobacco Cessation No Change   ? Warm-up and Cool-down Performed as group-led instruction   ? Resistance Training Performed Yes   ? VAD Patient? No   ? PAD/SET Patient? No   ?  ? Pain Assessment  ? Currently in Pain? Yes   ? Pain Score 7    ? Pain Location Back   ? Pain Orientation Lower   ? Pain Descriptors / Indicators Aching   ? Pain Type Chronic pain   ? Pain Onset More than a month ago   ? Pain Frequency Intermittent   ? Multiple Pain Sites No   ? ?  ?  ? ?  ? ? ?Capillary Blood Glucose: ?No results found for this or any previous visit (from the past 24 hour(s)). ? ? ? ?Social History  ? ?Tobacco Use  ?Smoking Status Some Days  ? Packs/day: 0.25  ? Years: 35.00  ? Pack years: 8.75  ? Types: Cigarettes  ? Start date: 63  ?Smokeless Tobacco Never  ?Tobacco Comments  ? Wears patches, 5 cigarettes smoked on some days especially the days that her uncle stress her with leaving the home.Some days smokes none when her uncle is out of town and not in the home.  ? ? ?Goals Met:  ?Proper associated with RPD/PD & O2 Sat ?Independence with exercise equipment ?Exercise tolerated well ?No report of concerns or symptoms today ?Strength training completed today ? ?Goals Unmet:  ?Not  Applicable ? ?Comments: Service time is from 0951 to 44. Completed post 6 MWT. ? ? ?Dr. Rodman Pickle is Medical Director for Pulmonary Rehab at [facility].  ?

## 2021-04-02 NOTE — Telephone Encounter (Signed)
Called [place] and asked her to come in earlier (9:45am) to do her 6 min walk test. She agreed.  ?

## 2021-04-02 NOTE — Progress Notes (Signed)
Discharge Progress Report  Patient Details  Name: Zyionna Braccio MRN: [id number] Date of Birth: 09/22/1965 Referring Provider:   Doristine Devoid Pulmonary Rehab Walk Test from 01/28/2021 in [facility]  Referring Provider Vassie Loll        Number of Visits: 15  Reason for Discharge:  Patient reached a stable level of exercise. Patient independent in their exercise. Patient has met program and personal goals.  Smoking History:  Social History   Tobacco Use  Smoking Status Some Days   Packs/day: 0.25   Years: 35.00   Pack years: 8.75   Types: Cigarettes   Start date: 1981  Smokeless Tobacco Never  Tobacco Comments   Wears patches, 5 cigarettes smoked on some days especially the days that her uncle stress her with leaving the home.Some days smokes none when her uncle is out of town and not in the home.    Diagnosis:  COPD, severe (HCC)  ADL [facility]SD:  Pulmonary Assessment Scores     Row Name 01/28/21 1205 03/26/21 1611       ADL UCSD   ADL Phase -- Exit    SOB Score total 48 43      CAT Score   CAT Score 26 26      mMRC Score   mMRC Score 4 --             Initial Exercise Prescription:  Initial Exercise Prescription - 01/28/21 1200       Date of Initial Exercise RX and Referring Provider   Date 01/28/21    Referring Provider Vassie Loll    Expected Discharge Date 04/02/21      Oxygen   Oxygen Continuous    Liters 2    Maintain Oxygen Saturation 88% or higher      Recumbant Bike   Level 1    RPM 40    Minutes 15      Arm Ergometer   Level 1    Minutes 15    METs 1.3      Prescription Details   Frequency (times per week) 2    Duration Progress to 30 minutes of continuous aerobic without signs/symptoms of physical distress      Intensity   THRR 40-80% of Max Heartrate 65-131    Ratings of Perceived Exertion 11-13    Perceived Dyspnea 0-4      Progression   Progression Continue to progress workloads to maintain  intensity without signs/symptoms of physical distress.      Resistance Training   Training Prescription Yes    Weight red bands    Reps 10-15             Discharge Exercise Prescription (Final Exercise Prescription Changes):  Exercise Prescription Changes - 03/24/21 1100       Response to Exercise   Blood Pressure (Admit) 114/64    Blood Pressure (Exercise) 140/60    Blood Pressure (Exit) 98/60    Heart Rate (Admit) 76 bpm    Heart Rate (Exercise) 96 bpm    Heart Rate (Exit) 81 bpm    Oxygen Saturation (Admit) 98 %    Oxygen Saturation (Exercise) 93 %    Oxygen Saturation (Exit) 96 %    Rating of Perceived Exertion (Exercise) 11    Perceived Dyspnea (Exercise) 1    Duration Continue with 30 min of aerobic exercise without signs/symptoms of physical distress.    Intensity THRR unchanged      Progression   Progression  Continue to progress workloads to maintain intensity without signs/symptoms of physical distress.      Resistance Training   Training Prescription Yes    Weight blue bands    Reps 10-15    Time 10 Minutes      Oxygen   Oxygen Continuous    Liters 2      Recumbant Bike   Level 3    Minutes 15    METs 4      Track   Laps 16    Minutes 15    METs 2.86      Oxygen   Maintain Oxygen Saturation 88% or higher             Functional Capacity:  6 Minute Walk     Row Name 01/28/21 1211 04/02/21 1151       6 Minute Walk   Phase Initial Discharge    Distance 1028 feet 1440 feet    Distance % Change -- 40.08 %    Distance Feet Change -- 412 ft    Walk Time 6 minutes 6 minutes    # of Rest Breaks 2  1:30-2:40, 5:40-6:00. Due to desat 2  2:24-3:00, 5:35-6:00    MPH 1.95 2.73    METS 2.98 3.75    RPE 11 7    Perceived Dyspnea  3 1    VO2 Peak 10.42 13.11    Symptoms No No    Resting HR 73 bpm 73 bpm    Resting BP 120/60 122/68    Resting Oxygen Saturation  94 % 97 %    Exercise Oxygen Saturation  during 6 min walk 84 % 85 %    Max Ex.  HR 98 bpm 96 bpm    Max Ex. BP 120/60 138/70    2 Minute Post BP 116/60 124/64      Interval HR   1 Minute HR 85 82    2 Minute HR 88  1:19- 84 83    3 Minute HR 91 85    4 Minute HR 92 87    5 Minute HR 91  5:40- 98 90    6 Minute HR 90 96    2 Minute Post HR 67 79    Interval Heart Rate? Yes Yes      Interval Oxygen   Interval Oxygen? Yes Yes    Baseline Oxygen Saturation % 94 % 97 %  2L    1 Minute Oxygen Saturation % 85 % 99 %    1 Minute Liters of Oxygen 0 L 2 L    2 Minute Oxygen Saturation % 84 %  84% @ 1:19 85 %    2 Minute Liters of Oxygen 0 L  Increased to 1L 2 L    3 Minute Oxygen Saturation % 88 % 95 %    3 Minute Liters of Oxygen 1 L 3 L    4 Minute Oxygen Saturation % 88 % 90 %    4 Minute Liters of Oxygen 1 L 3 L    5 Minute Oxygen Saturation % 86 % 91 %    5 Minute Liters of Oxygen 1 L 3 L    6 Minute Oxygen Saturation % 86 %  84% @ 5:40 88 %    6 Minute Liters of Oxygen 2 L  Increased to 2L 3 L    2 Minute Post Oxygen Saturation % 100 %  97% @ 10 min on RA 98 %  2 Minute Post Liters of Oxygen 2 L 3 L             Psychological, QOL, Others - Outcomes: PHQ 2/9: Depression screen [facility] 2/9 04/02/2021 01/28/2021 12/01/2020 10/22/2020 05/27/2020  Decreased Interest 1 2 1 3 3   Down, Depressed, Hopeless 0 3 1 3 3   PHQ - 2 Score 1 5 2 6 6   Altered sleeping 0 1 2 1 2   Tired, decreased energy 0 1 1 0 2  Change in appetite 0 0 2 0 2  Feeling bad or failure about yourself  0 1 2 1 2   Trouble concentrating 1 1 2 1 2   Moving slowly or fidgety/restless 0 0 0 1 0  Suicidal thoughts 0 1 1 1 1   PHQ-9 Score 2 10 12 11 17   Difficult doing work/chores Somewhat difficult Somewhat difficult - Very difficult -  Some recent data might be hidden    Quality of Life:   Personal Goals: Goals established at orientation with interventions provided to work toward goal.  Personal Goals and Risk Factors at Admission - 01/28/21 1258       Core Components/Risk Factors/Patient  Goals on Admission   Tobacco Cessation Yes    Number of packs per day Some days none, on the days that her uncle causes her stress 4-5 cigarettes per day.    Intervention Assist the participant in steps to quit. Provide individualized education and counseling about committing to Tobacco Cessation, relapse prevention, and pharmacological support that can be provided by physician.;Education officer, environmental, assist with locating and accessing local/national Quit Smoking programs, and support quit date choice.    Expected Outcomes Short Term: Will demonstrate readiness to quit, by selecting a quit date.;Short Term: Will quit all tobacco product use, adhering to prevention of relapse plan.;Long Term: Complete abstinence from all tobacco products for at least 12 months from quit date.    Improve shortness of breath with ADL's Yes    Intervention Provide education, individualized exercise plan and daily activity instruction to help decrease symptoms of SOB with activities of daily living.    Expected Outcomes Short Term: Improve cardiorespiratory fitness to achieve a reduction of symptoms when performing ADLs;Long Term: Be able to perform more ADLs without symptoms or delay the onset of symptoms    Increase knowledge of respiratory medications and ability to use respiratory devices properly  Yes    Intervention Provide education and demonstration as needed of appropriate use of medications, inhalers, and oxygen therapy.    Expected Outcomes Short Term: Achieves understanding of medications use. Understands that oxygen is a medication prescribed by physician. Demonstrates appropriate use of inhaler and oxygen therapy.;Long Term: Maintain appropriate use of medications, inhalers, and oxygen therapy.              Personal Goals Discharge:  Goals and Risk Factor Review     Row Name 02/18/21 1616 03/18/21 1559           Core Components/Risk Factors/Patient Goals Review   Personal Goals Review  Tobacco Cessation;Improve shortness of breath with ADL's;Develop more efficient breathing techniques such as purse lipped breathing and diaphragmatic breathing and practicing self-pacing with activity.;Increase knowledge of respiratory medications and ability to use respiratory devices properly. Tobacco Cessation;Improve shortness of breath with ADL's;Develop more efficient breathing techniques such as purse lipped breathing and diaphragmatic breathing and practicing self-pacing with activity.;Increase knowledge of respiratory medications and ability to use respiratory devices properly.      Review Driana was off to  a slow start with her progression of exercise. Her last session of exercise after finding out that she had been approved for her disabilityshe made a big imporvement. She increased her workload and MEt level on the recumbent bike and walked 14 laps on the track. She has been exercising on oxygen 2 liters her saturations have been from 91-97%. She has no home oxygen. She has continued to smoke" some days are better that others." I has stressed the importants of total cessation, encouraged her to chose a quit date and use the 1800 quit line. She has not utilized the quit line and feels that until she is completely away from her living situation that it won't change due to the stress there.She is exercising on the recumbent bike and track progressing well. She now has her home oxygen and her portable tank. She is walking in on her oxygen to PR. Her oxygen saturations have been from 90-95% while exercising on 2 liters of oxygen.      Expected Outcomes For Margrette to continue to progress in her workloads and MET levels, Improve her SOB with ADL and to use better breathing techniques.Complete tobacco cessation. Some days she has none,other days she gets stressed and will smoke some. Total smoking cessation. Continue to progress with exercise levels and METs. Improve her SOB with ADL's and more efficient  breathing techniques.               Exercise Goals and Review:  Exercise Goals     Row Name 01/28/21 1210 02/09/21 1107 03/09/21 1002         Exercise Goals   Increase Physical Activity Yes Yes Yes     Intervention Provide advice, education, support and counseling about physical activity/exercise needs.;Develop an individualized exercise prescription for aerobic and resistive training based on initial evaluation findings, risk stratification, comorbidities and participant's personal goals. Provide advice, education, support and counseling about physical activity/exercise needs.;Develop an individualized exercise prescription for aerobic and resistive training based on initial evaluation findings, risk stratification, comorbidities and participant's personal goals. Provide advice, education, support and counseling about physical activity/exercise needs.;Develop an individualized exercise prescription for aerobic and resistive training based on initial evaluation findings, risk stratification, comorbidities and participant's personal goals.     Expected Outcomes Short Term: Attend rehab on a regular basis to increase amount of physical activity.;Long Term: Exercising regularly at least 3-5 days a week. Short Term: Attend rehab on a regular basis to increase amount of physical activity.;Long Term: Exercising regularly at least 3-5 days a week. Short Term: Attend rehab on a regular basis to increase amount of physical activity.;Long Term: Exercising regularly at least 3-5 days a week.;Long Term: Add in home exercise to make exercise part of routine and to increase amount of physical activity.     Increase Strength and Stamina Yes Yes Yes     Intervention Develop an individualized exercise prescription for aerobic and resistive training based on initial evaluation findings, risk stratification, comorbidities and participant's personal goals.;Provide advice, education, support and counseling about  physical activity/exercise needs. Develop an individualized exercise prescription for aerobic and resistive training based on initial evaluation findings, risk stratification, comorbidities and participant's personal goals.;Provide advice, education, support and counseling about physical activity/exercise needs. Develop an individualized exercise prescription for aerobic and resistive training based on initial evaluation findings, risk stratification, comorbidities and participant's personal goals.;Provide advice, education, support and counseling about physical activity/exercise needs.     Expected Outcomes Short Term: Increase workloads from initial exercise  prescription for resistance, speed, and METs.;Short Term: Perform resistance training exercises routinely during rehab and add in resistance training at home;Long Term: Improve cardiorespiratory fitness, muscular endurance and strength as measured by increased METs and functional capacity ( ) Short Term: Increase workloads from initial exercise prescription for resistance, speed, and METs.;Short Term: Perform resistance training exercises routinely during rehab and add in resistance training at home;Long Term: Improve cardiorespiratory fitness, muscular endurance and strength as measured by increased METs and functional capacity ( ) Short Term: Increase workloads from initial exercise prescription for resistance, speed, and METs.;Short Term: Perform resistance training exercises routinely during rehab and add in resistance training at home;Long Term: Improve cardiorespiratory fitness, muscular endurance and strength as measured by increased METs and functional capacity ( )     Able to understand and use rate of perceived exertion (RPE) scale Yes Yes Yes     Intervention Provide education and explanation on how to use RPE scale Provide education and explanation on how to use RPE scale Provide education and explanation on how to use RPE scale      Expected Outcomes Short Term: Able to use RPE daily in rehab to express subjective intensity level;Long Term:  Able to use RPE to guide intensity level when exercising independently Short Term: Able to use RPE daily in rehab to express subjective intensity level;Long Term:  Able to use RPE to guide intensity level when exercising independently Short Term: Able to use RPE daily in rehab to express subjective intensity level;Long Term:  Able to use RPE to guide intensity level when exercising independently     Able to understand and use Dyspnea scale Yes Yes Yes     Intervention Provide education and explanation on how to use Dyspnea scale Provide education and explanation on how to use Dyspnea scale Provide education and explanation on how to use Dyspnea scale     Expected Outcomes Long Term: Able to use Dyspnea scale to guide intensity level when exercising independently Long Term: Able to use Dyspnea scale to guide intensity level when exercising independently Long Term: Able to use Dyspnea scale to guide intensity level when exercising independently;Short Term: Able to use Dyspnea scale daily in rehab to express subjective sense of shortness of breath during exertion     Knowledge and understanding of Target Heart Rate Range (THRR) Yes Yes Yes     Intervention Provide education and explanation of THRR including how the numbers were predicted and where they are located for reference Provide education and explanation of THRR including how the numbers were predicted and where they are located for reference Provide education and explanation of THRR including how the numbers were predicted and where they are located for reference     Expected Outcomes Long Term: Able to use THRR to govern intensity when exercising independently;Short Term: Able to state/look up THRR;Short Term: Able to use daily as guideline for intensity in rehab Long Term: Able to use THRR to govern intensity when exercising independently;Short  Term: Able to state/look up THRR;Short Term: Able to use daily as guideline for intensity in rehab Long Term: Able to use THRR to govern intensity when exercising independently;Short Term: Able to state/look up THRR;Short Term: Able to use daily as guideline for intensity in rehab     Understanding of Exercise Prescription Yes Yes Yes     Intervention Provide education, explanation, and written materials on patient's individual exercise prescription Provide education, explanation, and written materials on patient's individual exercise prescription Provide education, explanation, and written  materials on patient's individual exercise prescription     Expected Outcomes Short Term: Able to explain program exercise prescription;Long Term: Able to explain home exercise prescription to exercise independently Short Term: Able to explain program exercise prescription;Long Term: Able to explain home exercise prescription to exercise independently Short Term: Able to explain program exercise prescription;Long Term: Able to explain home exercise prescription to exercise independently              Exercise Goals Re-Evaluation:  Exercise Goals Re-Evaluation     Row Name 02/09/21 1107 03/09/21 1002           Exercise Goal Re-Evaluation   Exercise Goals Review Increase Physical Activity;Increase Strength and Stamina;Able to understand and use rate of perceived exertion (RPE) scale;Able to understand and use Dyspnea scale;Knowledge and understanding of Target Heart Rate Range (THRR);Understanding of Exercise Prescription Increase Physical Activity;Increase Strength and Stamina;Able to understand and use rate of perceived exertion (RPE) scale;Able to understand and use Dyspnea scale;Knowledge and understanding of Target Heart Rate Range (THRR);Understanding of Exercise Prescription      Comments Katlynd has complete 2 exercise sessions. She exercises for 15 min on the recumbent bike and arm ergometer. She  averages 2.2 METs at level 1 on the recumbent bike and 1 watt at level 1 on the arm ergometer. It is too soon to note any discernable progressions for both exercise modes. She performs the warmup and cooldown standing without limitations. Will continue to monitor and progress as able. Akiva has complete 9 exercise sessions. She exercises for 15 min on the recumbent bike and track. Earle was switched from the arm ergometer to the track. She tolerates the track well. Virgen averages 3.6 METs at level 2.5 on the recumbent bike and 2.16 METs on the track. Her lap count can change on the track dependent on her low back pain. Ripley performs the warmup and cooldown standing without limitations. Her band resistance has increased as she tolerates the new resistance band well. Shawnise is motivated to exercise and improve her functional capacity. We recently discussed her home exercise as she plans to join the [facility]. Will continue to monitor and progress as able.      Expected Outcomes Through exercise at rehab and home, the patient will decrease shortness of breath with daily activities and feel confident in carrying out and exercise regimen at home. Through exercise at rehab and home, the patient will decrease shortness of breath with daily activities and feel confident in carrying out and exercise regimen at home.               Nutrition & Weight - Outcomes:  Pre Biometrics - 04/02/21 0948       Pre Biometrics   Weight 77.4 kg    BMI (Calculated) 29.28    Grip Strength 22 kg              Nutrition:   Nutrition Discharge:   Education Questionnaire Score:  Knowledge Questionnaire Score - 03/26/21 1612       Knowledge Questionnaire Score   Post Score 18/18             Goals reviewed with patient; copy given to patient.

## 2021-04-03 DIAGNOSIS — J432 Centrilobular emphysema: Secondary | ICD-10-CM | POA: Diagnosis not present

## 2021-04-05 ENCOUNTER — Other Ambulatory Visit: Payer: Self-pay | Admitting: Specialist

## 2021-04-06 MED ORDER — ACETAMINOPHEN-CODEINE 300-30 MG PO TABS: 1.0000 | ORAL_TABLET | Freq: Four times a day (QID) | ORAL | 0 refills | Status: DC | PRN

## 2021-04-08 ENCOUNTER — Ambulatory Visit: Payer: Medicaid Other | Admitting: Specialist

## 2021-04-08 ENCOUNTER — Other Ambulatory Visit: Payer: Self-pay

## 2021-04-08 ENCOUNTER — Encounter: Payer: Self-pay | Admitting: Specialist

## 2021-04-08 VITALS — BP 108/77 | HR 78 | Ht 64.0 in | Wt 169.5 lb

## 2021-04-08 DIAGNOSIS — M5416 Radiculopathy, lumbar region: Secondary | ICD-10-CM | POA: Diagnosis not present

## 2021-04-08 DIAGNOSIS — M4722 Other spondylosis with radiculopathy, cervical region: Secondary | ICD-10-CM

## 2021-04-08 DIAGNOSIS — M7061 Trochanteric bursitis, right hip: Secondary | ICD-10-CM | POA: Diagnosis not present

## 2021-04-08 DIAGNOSIS — M7062 Trochanteric bursitis, left hip: Secondary | ICD-10-CM | POA: Diagnosis not present

## 2021-04-08 DIAGNOSIS — M5136 Other intervertebral disc degeneration, lumbar region: Secondary | ICD-10-CM

## 2021-04-08 MED ORDER — BUPIVACAINE HCL 0.5 % IJ SOLN
5.0000 mL | INTRAMUSCULAR | Status: AC | PRN
  Administered 2021-04-08: 5 mL via INTRA_ARTICULAR

## 2021-04-08 MED ORDER — METHYLPREDNISOLONE ACETATE 40 MG/ML IJ SUSP
40.0000 mg | INTRAMUSCULAR | Status: AC | PRN
  Administered 2021-04-08: 40 mg via INTRA_ARTICULAR

## 2021-04-08 NOTE — Patient Instructions (Addendum)
Avoid frequent bending and stooping  ?No lifting greater than 10 lbs. ?May use ice or moist heat for pain. ?Weight loss is of benefit. ?Best medication for lumbar disc disease is arthritis medications like motrin, celebrex and naprosyn. ?Exercise is important to improve your indurance and does allow people to function better inspite of back pain. ? Avoid bending, stooping and avoid lifting weights greater than 10 lbs. ?Avoid prolong standing and walking. ?Avoid frequent bending and stooping  ?No lifting greater than 10 lbs. ?May use ice or moist heat for pain. ?Weight loss is of benefit. ?Permanent disabled Handicap license is approved. ?Dr. Romona Curls secretary/Assistant will call to arrange for epidural steroid injection   ?

## 2021-04-08 NOTE — Progress Notes (Signed)
? ?Office Visit Note ?  ?Patient: Cheryl King           ?Date of Birth: [birth year]           ?MRN: [id number] ?Visit Date: 04/08/2021 ?             ?Requested by: Charlott Rakes, MD ?[address] ?[address] ?[place],  [place] [address] ?PCP: Charlott Rakes, MD ? ? ?Assessment & Plan: ?Visit Diagnoses:  ?1. Degenerative disc disease, lumbar   ?2. Lumbar radiculopathy   ?3. Greater trochanteric bursitis, left   ?4. Greater trochanteric bursitis, right   ?5. Trochanteric bursitis, right hip   ?6. Other spondylosis with radiculopathy, cervical region   ? ? ?Plan: Avoid frequent bending and stooping  ?No lifting greater than 10 lbs. ?May use ice or moist heat for pain. ?Weight loss is of benefit. ?Best medication for lumbar disc disease is arthritis medications like motrin, celebrex and naprosyn. ?Exercise is important to improve your indurance and does allow people to function better inspite of back pain. ? Avoid bending, stooping and avoid lifting weights greater than 10 lbs. ?Avoid prolong standing and walking. ?Avoid frequent bending and stooping  ?No lifting greater than 10 lbs. ?May use ice or moist heat for pain. ?Weight loss is of benefit. ?Permanent disabled Handicap license is approved. ?Dr. Romona Curls secretary/Assistant will call to arrange for epidural steroid injection  ? ?Follow-Up Instructions: Return in about 6 months (around 10/09/2021).  ? ?Orders:  ?No orders of the defined types were placed in this encounter. ? ?No orders of the defined types were placed in this encounter. ? ? ? ? Procedures: ?Large Joint Inj: bilateral greater trochanter on 04/08/2021 10:18 AM ?Indications: pain ?Details: 22 G 3.5 in needle, lateral approach ? ?Arthrogram: No ? ?Medications (Right): 40 mg methylPREDNISolone acetate 40 MG/ML; 5 mL bupivacaine 0.5 % ?Medications (Left): 40 mg methylPREDNISolone acetate 40 MG/ML; 5 mL bupivacaine 0.5 % ?Outcome: tolerated well, no immediate complications ? ?Band aid  applied ?Procedure, treatment alternatives, risks and benefits explained, specific risks discussed. Immediately prior to procedure a time out was called to verify the correct patient, procedure, equipment, support staff and site/side marked as required. Patient was prepped and draped in the usual sterile fashion.  ? ? ? ?Clinical Data: ?No additional findings. ? ? ?Subjective: ?Chief Complaint  ?Patient presents with  ? Lower Back - Follow-up  ? ? ?56 year old female with history of back pain and some radiation into the legs into the feet. Worse with standing and walking. Has had bilateral L5 TF ESIs with good relief. Called recently with need for renewal her pain meds. She is still experiencing bursitis like pain over the left greater trochanter. Pain is present all the time and she is taking intermittant Tylenol #3 for pain. No ; ?Bowel or bladder difficulty. Can not  ? ? ?Review of Systems ? ? ?Objective: ?Vital Signs: BP 108/77 (BP Location: Left Arm, Patient Position: Sitting)   Pulse 78   Ht '5\' 4"'$  (1.626 m)   Wt 169 lb 8 oz (76.9 kg)   BMI 29.09 kg/m?  ? ?Physical Exam ? ?Ortho Exam ? ?Specialty Comments:  ?MRI LUMBAR SPINE WITHOUT AND WITH CONTRAST ?  ?TECHNIQUE: ?Multiplanar and multiecho pulse sequences of the lumbar spine were ?obtained without and with intravenous contrast. ?  ?CONTRAST:  36m MULTIHANCE GADOBENATE DIMEGLUMINE 529 MG/ML IV SOLN ?  ?COMPARISON:  01/05/2017 ?  ?FINDINGS: ?Segmentation:  5 lumbar type vertebrae ?  ?Alignment:  Mild scoliosis.  No listhesis ?  ?Vertebrae: No fracture, evidence of discitis, or bone lesion. ?Endplate sclerosis has developed at L2-3 and L5-S1, degenerative ?appearing. ?  ?Conus medullaris and cauda equina: Conus extends to the L1 level. ?Conus and cauda equina appear normal. ?  ?Paraspinal and other soft tissues: No perispinal mass or ?inflammation noted. ?  ?Disc levels: ?  ?T11-12: Chronic disc collapse with ventral protrusion. ?  ?T12- L1: Unremarkable. ?   ?L1-L2: Disc narrowing and bulging with endplate degeneration. Patent ?canal and foramina ?  ?L2-L3: Disc narrowing and bulging with endplate degeneration. ?Circumferential disc bulging and facet spurring. Mild or moderate ?left foraminal narrowing ?  ?L3-L4: Disc narrowing and bulging with facet spurring. Patent canal ?and foramina ?  ?L4-L5: Disc narrowing and bulging. Degenerative facet spurring ?greater on the right. Mild-to-moderate right foraminal narrowing ?  ?L5-S1:Disc collapse and endplate degeneration with bulge. ?Degenerative facet spurring on the right more than left. Biforaminal ?impingement, worse on the right and progressed. ?  ?IMPRESSION: ?1. Generalized lumbar spine degeneration with progression from 2018, ?especially at L5-S1 where there is right more than left foraminal ?impingement. ?2. Diffusely patent spinal canal. ?3. Mild scoliosis. ?  ?  ?Electronically Signed ?  By: Jorje Guild M.D. ?  On: 09/30/2020 09:57 ? ?Imaging: ?No results found. ? ? ?PMFS History: ?Patient Active Problem List  ? Diagnosis Date Noted  ? Chronic respiratory failure with hypoxia ([place]) 03/11/2021  ? Centrilobular emphysema ([place]) 07/29/2020  ? Multiple nodules of lung 07/29/2020  ? Hyperlipidemia 07/18/2017  ? Ulnar neuropathy 04/18/2017  ? GERD (gastroesophageal reflux disease) 10/19/2016  ? Prediabetes 05/03/2016  ? Cubital tunnel syndrome on right 03/01/2016  ? Numbness and tingling of right arm 01/01/2016  ? Chlamydia infection 01/27/2015  ? Tobacco abuse 12/26/2013  ? Chronic lower back pain 04/13/2013  ? Sciatica 04/13/2013  ? Chronic neck pain 04/13/2013  ? Spondylosis of cervical spine 04/13/2013  ? ?Past Medical History:  ?Diagnosis Date  ? Acid reflux   ? Allergy   ? Carpal tunnel syndrome   ? COPD (chronic obstructive pulmonary disease) ([place])   ? Depression   ? Diverticulitis   ? Hyperlipidemia   ? Low back pain   ? Neck pain   ? Sacroiliac inflammation ([place])   ? Sciatica   ? Seasonal allergies   ?   ?Family History  ?Problem Relation Age of Onset  ? Healthy Mother   ? Other Father   ?     Unsure of medical history  ? Asthma Maternal Aunt   ? Diabetes Maternal Aunt   ? Cancer Maternal Aunt   ? Breast cancer Maternal Aunt   ? Hypertension Maternal Grandmother   ? Heart Problems Maternal Grandmother   ? Hypertension Other   ? COPD Other   ? Colon cancer Neg Hx   ? Pancreatic cancer Neg Hx   ? Rectal cancer Neg Hx   ? Stomach cancer Neg Hx   ? Colon polyps Neg Hx   ? Esophageal cancer Neg Hx   ?  ?Past Surgical History:  ?Procedure Laterality Date  ? ABDOMINAL SURGERY    ? ANKLE SURGERY    ? lt.  ? BREAST EXCISIONAL BIOPSY Right   ? pt states years ago- not sure when  ? PARTIAL HYSTERECTOMY    ? right breast cyst removed     ? URETHRAL DIVERTICULUM REPAIR    ? uretrral diverticultis  ? ?Social History  ? ?Occupational History  ?  Occupation: Unemployed  ? Occupation: past Scientist, water quality, Radio broadcast assistant at [employer], office positions, Scientist, product/process development.  ?Tobacco Use  ? Smoking status: Some Days  ?  Packs/day: 0.25  ?  Years: 35.00  ?  Pack years: 8.75  ?  Types: Cigarettes  ?  Start date: 54  ? Smokeless tobacco: Never  ? Tobacco comments:  ?  Wears patches, 5 cigarettes smoked on some days especially the days that her uncle stress her with leaving the home.Some days smokes none when her uncle is out of town and not in the home.  ?Vaping Use  ? Vaping Use: Never used  ?Substance and Sexual Activity  ? Alcohol use: Not Currently  ?  Comment: Social only - seldom  ? Drug use: No  ? Sexual activity: Never  ? ? ? ? ? ? ?

## 2021-04-13 DIAGNOSIS — F4310 Post-traumatic stress disorder, unspecified: Secondary | ICD-10-CM | POA: Diagnosis not present

## 2021-04-13 DIAGNOSIS — F331 Major depressive disorder, recurrent, moderate: Secondary | ICD-10-CM | POA: Diagnosis not present

## 2021-04-15 DIAGNOSIS — K200 Eosinophilic esophagitis: Secondary | ICD-10-CM | POA: Diagnosis not present

## 2021-04-17 ENCOUNTER — Other Ambulatory Visit: Payer: Self-pay | Admitting: Family Medicine

## 2021-04-17 ENCOUNTER — Other Ambulatory Visit: Payer: Self-pay | Admitting: Acute Care

## 2021-04-17 ENCOUNTER — Other Ambulatory Visit: Payer: Self-pay | Admitting: Cardiology

## 2021-04-17 DIAGNOSIS — Z720 Tobacco use: Secondary | ICD-10-CM

## 2021-05-04 DIAGNOSIS — J432 Centrilobular emphysema: Secondary | ICD-10-CM | POA: Diagnosis not present

## 2021-05-04 DIAGNOSIS — K219 Gastro-esophageal reflux disease without esophagitis: Secondary | ICD-10-CM | POA: Diagnosis not present

## 2021-05-04 DIAGNOSIS — K253 Acute gastric ulcer without hemorrhage or perforation: Secondary | ICD-10-CM | POA: Diagnosis not present

## 2021-05-04 DIAGNOSIS — Z800 Family history of malignant neoplasm of digestive organs: Secondary | ICD-10-CM | POA: Diagnosis not present

## 2021-05-13 ENCOUNTER — Other Ambulatory Visit: Payer: Self-pay | Admitting: Specialist

## 2021-05-13 DIAGNOSIS — M4726 Other spondylosis with radiculopathy, lumbar region: Secondary | ICD-10-CM

## 2021-05-14 DIAGNOSIS — F4310 Post-traumatic stress disorder, unspecified: Secondary | ICD-10-CM | POA: Diagnosis not present

## 2021-05-14 DIAGNOSIS — F331 Major depressive disorder, recurrent, moderate: Secondary | ICD-10-CM | POA: Diagnosis not present

## 2021-05-21 ENCOUNTER — Other Ambulatory Visit: Payer: Self-pay | Admitting: Pharmacist

## 2021-05-21 DIAGNOSIS — R0982 Postnasal drip: Secondary | ICD-10-CM

## 2021-05-21 MED ORDER — FLUTICASONE PROPIONATE 50 MCG/ACT NA SUSP: NASAL | 0 refills | Status: DC

## 2021-05-25 ENCOUNTER — Other Ambulatory Visit: Payer: Self-pay | Admitting: Family Medicine

## 2021-05-25 ENCOUNTER — Other Ambulatory Visit: Payer: Self-pay | Admitting: Specialist

## 2021-05-25 DIAGNOSIS — M5441 Lumbago with sciatica, right side: Secondary | ICD-10-CM

## 2021-05-26 NOTE — Telephone Encounter (Signed)
Requested medications are due for refill today.  Prednisone - no, Duloxetine - yes ? ?Requested medications are on the active medications list.  Prednisone - no, Duloxetine - yes ? ?Last refill. Prednisone 12/19/2020 #5 0 refills, Duloxetine 12/01/2020 #90 1 refill ? ?Future visit scheduled.   yes ? ?Notes to clinic.  Prednisone refills are not delegated; medication was discontinued 02/18/2021. Duloxetine refill failed protocol d/t expired labs. ? ? ? ?Requested Prescriptions  ?Pending Prescriptions Disp Refills  ? predniSONE (DELTASONE) 20 MG tablet [Pharmacy Med Name: PREDNISONE 20 MG TABLET] 5 tablet 0  ?  Sig: TAKE 1 TABLET BY MOUTH DAILY WITH BREAKFAST  ?  ? Not Delegated - Endocrinology:  Oral Corticosteroids Failed - 05/25/2021 11:57 AM  ?  ?  Failed - This refill cannot be delegated  ?  ?  Failed - Manual Review: Eye exam for IOP if prolonged treatment  ?  ?  Failed - Glucose (serum) in normal range and within 180 days  ?  Glucose  ?Date Value Ref Range Status  ?01/28/2020 84 65 - 99 mg/dL Final  ? ?Glucose, Bld  ?Date Value Ref Range Status  ?04/13/2013 76 70 - 99 mg/dL Final  ? ?Glucose-Capillary  ?Date Value Ref Range Status  ?11/10/2011 79 70 - 99 mg/dL Final  ?  ?  ?  ?  Failed - K in normal range and within 180 days  ?  Potassium  ?Date Value Ref Range Status  ?01/28/2020 4.4 3.5 - 5.2 mmol/L Final  ?  ?  ?  ?  Failed - Na in normal range and within 180 days  ?  Sodium  ?Date Value Ref Range Status  ?01/28/2020 138 134 - 144 mmol/L Final  ?  ?  ?  ?  Failed - Bone Mineral Density or Dexa Scan completed in the last 2 years  ?  ?  Passed - Last BP in normal range  ?  BP Readings from Last 1 Encounters:  ?04/08/21 108/77  ?  ?  ?  ?  Passed - Valid encounter within last 6 months  ?  Recent Outpatient Visits   ? ?      ? 3 months ago Need for shingles vaccine  ? [place], RPH-CPP  ? 5 months ago Bilateral low back pain with bilateral sciatica, unspecified  chronicity  ? [place], Charlane Ferretti, MD  ? 9 months ago Chronic tension-type headache, not intractable  ? [place], Charlane Ferretti, MD  ? 12 months ago Current mild episode of major depressive disorder without prior episode [facility])  ? [place] Charlott Rakes, MD  ? 1 year ago Tobacco abuse  ? [place] Charlott Rakes, MD  ? ?  ?  ?Future Appointments   ? ?        ? In 6 days Charlott Rakes, MD [place]  ? In 1 month Rigoberto Noel, MD [facility]  ? In 4 months Jessy Oto, MD [place]  ? ?  ? ? ?  ?  ?  ? DULoxetine (CYMBALTA) 60 MG capsule [Pharmacy Med Name: DULOXETINE HCL DR 60 MG CAP] 90 capsule 1  ?  Sig: TAKE 1 CAPSULE BY MOUTH EVERY DAY  ?  ? Psychiatry: Antidepressants - SNRI - duloxetine Failed - 05/25/2021  11:57 AM  ?  ?  Failed - Cr in normal range and within 360 days  ?  Creat  ?Date Value Ref Range Status  ?04/13/2013 0.74 0.50 - 1.10 mg/dL Final  ? ?Creatinine, Ser  ?Date Value Ref Range Status  ?01/28/2020 0.79 0.57 - 1.00 mg/dL Final  ?  ?  ?  ?  Failed - eGFR is 30 or above and within 360 days  ?  GFR, Est African American  ?Date Value Ref Range Status  ?04/13/2013 >89 mL/min Final  ? ?GFR calc Af Wyvonnia Lora  ?Date Value Ref Range Status  ?01/28/2020 97 >59 mL/min/1.73 Final  ?  Comment:  ?  **In accordance with recommendations from the NKF-ASN Task force,** ?  [facility] is in the process of updating its eGFR calculation to the ?  2021 CKD-EPI creatinine equation that estimates kidney function ?  without a race variable. ?  ? ?GFR, Est Non African American  ?Date Value Ref Range Status  ?04/13/2013 >89 mL/min Final  ?  Comment:  ?    ?The estimated GFR is a calculation valid for adults (>=24 years old) ?that uses the CKD-EPI algorithm to adjust for age and sex. It is   ?not to be used for children, pregnant  women, hospitalized patients,    ?patients on dialysis, or with rapidly changing kidney function. ?According to the NKDEP, eGFR >89 is normal, 60-89 shows mild ?impairment, 30-59 shows moderate impairment, 15-29 shows severe ?impairment and <15 is ESRD. ?   ? ?GFR calc non Af Amer  ?Date Value Ref Range Status  ?01/28/2020 84 >59 mL/min/1.73 Final  ?  ?  ?  ?  Passed - Completed PHQ-2 or PHQ-9 in the last 360 days  ?  ?  Passed - Last BP in normal range  ?  BP Readings from Last 1 Encounters:  ?04/08/21 108/77  ?  ?  ?  ?  Passed - Valid encounter within last 6 months  ?  Recent Outpatient Visits   ? ?      ? 3 months ago Need for shingles vaccine  ? [place], RPH-CPP  ? 5 months ago Bilateral low back pain with bilateral sciatica, unspecified chronicity  ? [place], Charlane Ferretti, MD  ? 9 months ago Chronic tension-type headache, not intractable  ? [place], Charlane Ferretti, MD  ? 12 months ago Current mild episode of major depressive disorder without prior episode [facility])  ? [place] Charlott Rakes, MD  ? 1 year ago Tobacco abuse  ? [place] Charlott Rakes, MD  ? ?  ?  ?Future Appointments   ? ?        ? In 6 days Charlott Rakes, MD [place]  ? In 1 month Rigoberto Noel, MD [facility]  ? In 4 months Jessy Oto, MD [place]  ? ?  ? ? ?  ?  ?  ?  ?

## 2021-05-27 DIAGNOSIS — F4310 Post-traumatic stress disorder, unspecified: Secondary | ICD-10-CM | POA: Diagnosis not present

## 2021-05-27 DIAGNOSIS — F331 Major depressive disorder, recurrent, moderate: Secondary | ICD-10-CM | POA: Diagnosis not present

## 2021-06-01 ENCOUNTER — Encounter: Payer: Self-pay | Admitting: Family Medicine

## 2021-06-01 ENCOUNTER — Ambulatory Visit: Payer: Medicaid Other | Attending: Family Medicine | Admitting: Family Medicine

## 2021-06-01 VITALS — BP 110/71 | HR 75 | Ht 64.0 in | Wt 167.0 lb

## 2021-06-01 DIAGNOSIS — R600 Localized edema: Secondary | ICD-10-CM

## 2021-06-01 DIAGNOSIS — G44229 Chronic tension-type headache, not intractable: Secondary | ICD-10-CM

## 2021-06-01 DIAGNOSIS — E7800 Pure hypercholesterolemia, unspecified: Secondary | ICD-10-CM | POA: Diagnosis not present

## 2021-06-01 DIAGNOSIS — M5442 Lumbago with sciatica, left side: Secondary | ICD-10-CM

## 2021-06-01 DIAGNOSIS — Z720 Tobacco use: Secondary | ICD-10-CM

## 2021-06-01 DIAGNOSIS — G8929 Other chronic pain: Secondary | ICD-10-CM

## 2021-06-01 DIAGNOSIS — N39498 Other specified urinary incontinence: Secondary | ICD-10-CM

## 2021-06-01 DIAGNOSIS — M5441 Lumbago with sciatica, right side: Secondary | ICD-10-CM

## 2021-06-01 MED ORDER — PREGABALIN 75 MG PO CAPS: 75.0000 mg | ORAL_CAPSULE | Freq: Two times a day (BID) | ORAL | 5 refills | Status: DC

## 2021-06-01 MED ORDER — OXYBUTYNIN CHLORIDE 5 MG PO TABS: ORAL_TABLET | Freq: Two times a day (BID) | ORAL | 1 refills | Status: DC

## 2021-06-01 MED ORDER — DULOXETINE HCL 60 MG PO CPEP: ORAL_CAPSULE | Freq: Every day | ORAL | 1 refills | Status: DC

## 2021-06-01 MED ORDER — LIDOCAINE 5 % EX PTCH: 1.0000 | MEDICATED_PATCH | CUTANEOUS | 3 refills | Status: DC

## 2021-06-01 MED ORDER — AMITRIPTYLINE HCL 50 MG PO TABS: ORAL_TABLET | Freq: Every day | ORAL | 1 refills | Status: DC

## 2021-06-01 MED ORDER — FUROSEMIDE 20 MG PO TABS: 20.0000 mg | ORAL_TABLET | Freq: Every day | ORAL | 0 refills | Status: DC

## 2021-06-01 MED ORDER — CYCLOBENZAPRINE HCL 10 MG PO TABS: ORAL_TABLET | Freq: Two times a day (BID) | ORAL | 1 refills | Status: DC | PRN

## 2021-06-01 MED ORDER — ATORVASTATIN CALCIUM 20 MG PO TABS: ORAL_TABLET | Freq: Every day | ORAL | 1 refills | Status: DC

## 2021-06-01 MED ORDER — NICOTINE 21 MG/24HR TD PT24: 21.0000 mg | MEDICATED_PATCH | Freq: Every day | TRANSDERMAL | 2 refills | Status: DC

## 2021-06-01 NOTE — Progress Notes (Signed)
? ?Subjective:  ?Patient ID: Cheryl King, female    DOB: [birth year]  Age: 56 y.o. MRN: [id number] ? ?CC: Back Pain ? ? ?HPI ?Cheryl King is a 56 y.o. year old female with a history of Prediabetes (diet controlled A1c 5.9) chronic low back pain with sciatica, GERD, hyperlipidemia,  Emphysema (uses oxygen at night) tobacco abuse here for a follow-up visit. ? ?Interval History: ?Her lower back continues to hurt.  She last had a visit with orthopedic Dr. Louanne Skye in 03/2021 and notes revealed with plans to arrange for epidural steroid injection.  She was also referred to rehab medicine.  She has an upcoming appointment for epidural steroid injection next month. ? ?She complains of a 2 day h/o ankle swelling x2 days and endorses ingestion of high sodium foods. She has been on her feet all day 2 days ago unpacking since she just moved to a new place.  She has no orthopnea, decreased exercise tolerance, PND. ? ?She was taken off Naproxen by GI due to findings of peptic ulcer on EGD.  Doing well on his statin and her emphysema is stable on her current regimen.  She continues to smoke and is requesting refill of nicotine patches. ?Past Medical History:  ?Diagnosis Date  ? Acid reflux   ? Allergy   ? Carpal tunnel syndrome   ? COPD (chronic obstructive pulmonary disease) ([place])   ? Depression   ? Diverticulitis   ? Hyperlipidemia   ? Low back pain   ? Neck pain   ? Sacroiliac inflammation ([place])   ? Sciatica   ? Seasonal allergies   ? ? ?Past Surgical History:  ?Procedure Laterality Date  ? ABDOMINAL SURGERY    ? ANKLE SURGERY    ? lt.  ? BREAST EXCISIONAL BIOPSY Right   ? pt states years ago- not sure when  ? PARTIAL HYSTERECTOMY    ? right breast cyst removed     ? URETHRAL DIVERTICULUM REPAIR    ? uretrral diverticultis  ? ? ?Family History  ?Problem Relation Age of Onset  ? Healthy Mother   ? Other Father   ?     Unsure of medical history  ? Asthma Maternal Aunt   ? Diabetes Maternal Aunt   ? Cancer Maternal Aunt    ? Breast cancer Maternal Aunt   ? Hypertension Maternal Grandmother   ? Heart Problems Maternal Grandmother   ? Hypertension Other   ? COPD Other   ? Colon cancer Neg Hx   ? Pancreatic cancer Neg Hx   ? Rectal cancer Neg Hx   ? Stomach cancer Neg Hx   ? Colon polyps Neg Hx   ? Esophageal cancer Neg Hx   ? ? ?Social History  ? ?Socioeconomic History  ? Marital status: Married  ?  Spouse name: Cheryl King  ? Number of children: 0  ? Years of education: Bachelors  ? Highest education level: Associate degree: academic program  ?Occupational History  ? Occupation: Unemployed  ? Occupation: past Scientist, water quality, Radio broadcast assistant at [employer], office positions, Scientist, product/process development.  ?Tobacco Use  ? Smoking status: Some Days  ?  Packs/day: 0.25  ?  Years: 35.00  ?  Pack years: 8.75  ?  Types: Cigarettes  ?  Start date: 16  ? Smokeless tobacco: Never  ? Tobacco comments:  ?  Wears patches, 5 cigarettes smoked on some days especially the days that her uncle stress her with leaving the home.Some days smokes none when  her uncle is out of town and not in the home.  ?Vaping Use  ? Vaping Use: Never used  ?Substance and Sexual Activity  ? Alcohol use: Not Currently  ?  Comment: Social only - seldom  ? Drug use: No  ? Sexual activity: Never  ?Other Topics Concern  ? Not on file  ?Social History Narrative  ? Lives at home alone.  ? Right-handed.  ? Occasional caffeine use.  ? ?Social Determinants of Health  ? ?Financial Resource Strain: Not on file  ?Food Insecurity: No Food Insecurity  ? Worried About Charity fundraiser in the Last Year: Never true  ? Ran Out of Food in the Last Year: Never true  ?Transportation Needs: Not on file  ?Physical Activity: Not on file  ?Stress: Not on file  ?Social Connections: Not on file  ? ? ?Allergies  ?Allergen Reactions  ? Percocet [Oxycodone-Acetaminophen] Nausea Only  ? Vicodin [Hydrocodone-Acetaminophen] Nausea Only  ? Penicillins Rash  ? ? ?Outpatient Medications Prior to Visit  ?Medication  Sig Dispense Refill  ? Acetaminophen-Codeine 300-30 MG tablet TAKE 1 TO 2 TABLETS BY MOUTH EVERY 6 HOURS AS NEEDED FOR PAIN 40 tablet 0  ? albuterol (VENTOLIN HFA) 108 (90 Base) MCG/ACT inhaler Inhale 2 puffs into the lungs every 6 (six) hours as needed for wheezing or shortness of breath. 18 g 5  ? Azelastine HCl 137 MCG/SPRAY SOLN Place into both nostrils.    ? budesonide-formoterol (SYMBICORT) 160-4.5 MCG/ACT inhaler INHALE 2 PUFFS INTO THE LUNGS IN THE MORNING AND AT BEDTIME. 10.2 each 5  ? cetirizine (ZYRTEC) 10 MG tablet Take 1 tablet (10 mg total) by mouth daily. 90 tablet 1  ? diclofenac sodium (VOLTAREN) 1 % GEL Apply 2 g topically 4 (four) times daily. 5 Tube 2  ? fluconazole (DIFLUCAN) 150 MG tablet Take 1 tablet (150 mg total) by mouth once a week. 2 tablet 0  ? fluticasone (FLONASE) 50 MCG/ACT nasal spray SPRAY 2 SPRAYS INTO EACH NOSTRIL EVERY DAY 16 mL 0  ? Lactobacillus CAPS Take 1 capsule by mouth daily.    ? miconazole (MICATIN) 2 % cream Apply 1 application topically 2 (two) times daily. 28.35 g 0  ? montelukast (SINGULAIR) 10 MG tablet Take 10 mg by mouth daily.    ? Multiple Vitamin (MULTIVITAMIN WITH MINERALS) TABS tablet Take 1 tablet by mouth daily.    ? RESTASIS 0.05 % ophthalmic emulsion   3  ? Tiotropium Bromide Monohydrate (SPIRIVA RESPIMAT) 2.5 MCG/ACT AERS Inhale 2 puffs into the lungs daily. 1 each 12  ? triamcinolone cream (KENALOG) 0.1 % Apply 1 application topically 2 (two) times daily. 45 g 1  ? amitriptyline (ELAVIL) 50 MG tablet TAKE 1 TABLET (50 MG TOTAL) BY MOUTH AT BEDTIME. 90 tablet 1  ? atorvastatin (LIPITOR) 20 MG tablet TAKE 1 TABLET (20 MG TOTAL) BY MOUTH DAILY. 90 tablet 1  ? cyclobenzaprine (FLEXERIL) 10 MG tablet TAKE 1 TABLET (10 MG TOTAL) BY MOUTH 2 (TWO) TIMES DAILY AS NEEDED FOR MUSCLE SPASMS. 180 tablet 1  ? DULoxetine (CYMBALTA) 60 MG capsule TAKE 1 CAPSULE (60 MG TOTAL) BY MOUTH DAILY. 90 capsule 1  ? lidocaine (LIDODERM) 5 % Place 1 patch onto the skin daily.  Remove & Discard patch within 12 hours or as directed by MD 30 patch 3  ? naproxen (NAPROSYN) 500 MG tablet TAKE 1 TABLET BY MOUTH TWICE A DAY WITH MEALS 60 tablet 3  ? nicotine (NICODERM CQ - DOSED  IN MG/24 HOURS) 21 mg/24hr patch PLACE 1 PATCH ONTO THE SKIN DAILY. 28 patch 0  ? oxybutynin (DITROPAN) 5 MG tablet TAKE 1 TABLET (5 MG TOTAL) BY MOUTH 2 (TWO) TIMES DAILY. 180 tablet 1  ? pregabalin (LYRICA) 75 MG capsule Take 1 capsule (75 mg total) by mouth 2 (two) times daily. 60 capsule 5  ? ezetimibe (ZETIA) 10 MG tablet Take 1 tablet (10 mg total) by mouth daily after supper. 90 tablet 1  ? ?Facility-Administered Medications Prior to Visit  ?Medication Dose Route Frequency Provider Last Rate Last Admin  ? 0.9 %  sodium chloride infusion  500 mL Intravenous Once Doran Stabler, MD      ? ? ? ?ROS ?Review of Systems  ?Constitutional:  Negative for activity change, appetite change and fatigue.  ?HENT:  Negative for congestion, sinus pressure and sore throat.   ?Eyes:  Negative for visual disturbance.  ?Respiratory:  Negative for cough, chest tightness, shortness of breath and wheezing.   ?Cardiovascular:  Positive for leg swelling. Negative for chest pain and palpitations.  ?Gastrointestinal:  Negative for abdominal distention, abdominal pain and constipation.  ?Endocrine: Negative for polydipsia.  ?Genitourinary:  Negative for dysuria and frequency.  ?Musculoskeletal:   ?     See HPI  ?Skin:  Negative for rash.  ?Neurological:  Negative for tremors, light-headedness and numbness.  ?Hematological:  Does not bruise/bleed easily.  ?Psychiatric/Behavioral:  Negative for agitation and behavioral problems.   ? ?Objective:  ?BP 110/71   Pulse 75   Ht '5\' 4"'$  (1.626 m)   Wt 167 lb (75.8 kg)   SpO2 91%   BMI 28.67 kg/m?  ? ? ?  06/01/2021  ?  9:20 AM 04/08/2021  ?  9:29 AM 04/02/2021  ?  9:48 AM  ?BP/Weight  ?Systolic BP 989 211   ?Diastolic BP 71 77   ?Wt. (Lbs) 167 169.5 170.64  ?BMI 28.67 kg/m2 29.09 kg/m2 29.29 kg/m2   ? ? ? ? ?Physical Exam ?Constitutional:   ?   Appearance: She is well-developed.  ?Cardiovascular:  ?   Rate and Rhythm: Normal rate.  ?   Heart sounds: Normal heart sounds. No murmur heard. ?Pulmonary:  ?

## 2021-06-01 NOTE — Patient Instructions (Signed)
Edema ? ?Edema is when you have too much fluid in your body or under your skin. Edema may make your legs, feet, and ankles swell. Swelling often happens in looser tissues, such as around your eyes. This is a common condition. It gets more common as you get older. ?There are many possible causes of edema. These include: ?Eating too much salt (sodium). ?Being on your feet or sitting for a long time. ?Certain medical conditions, such as: ?Pregnancy. ?Heart failure. ?Liver disease. ?Kidney disease. ?Cancer. ?Hot weather may make edema worse. Edema is usually painless. Your skin may look swollen or shiny. ?Follow these instructions at home: ?Medicines ?Take over-the-counter and prescription medicines only as told by your doctor. ?Your doctor may prescribe a medicine to help your body get rid of extra water (diuretic). Take this medicine if you are told to take it. ?Eating and drinking ?Eat a low-salt (low-sodium) diet as told by your doctor. Sometimes, eating less salt may reduce swelling. ?Depending on the cause of your swelling, you may need to limit how much fluid you drink (fluid restriction). ?General instructions ?Raise the injured area above the level of your heart while you are sitting or lying down. ?Do not sit still or stand for a long time. ?Do not wear tight clothes. Do not wear garters on your upper legs. ?Exercise your legs. This can help the swelling go down. ?Wear compression stockings as told by your doctor. It is important that these are the right size. These should be prescribed by your doctor to prevent possible injuries. ?If elastic bandages or wraps are recommended, use them as told by your doctor. ?Contact a doctor if: ?Treatment is not working. ?You have heart, liver, or kidney disease and have symptoms of edema. ?You have sudden and unexplained weight gain. ?Get help right away if: ?You have shortness of breath or chest pain. ?You cannot breathe when you lie down. ?You have pain, redness, or  warmth in the swollen areas. ?You have heart, liver, or kidney disease and get edema all of a sudden. ?You have a fever and your symptoms get worse all of a sudden. ?These symptoms may be an emergency. Get help right away. Call 911. ?Do not wait to see if the symptoms will go away. ?Do not drive yourself to the hospital. ?Summary ?Edema is when you have too much fluid in your body or under your skin. ?Edema may make your legs, feet, and ankles swell. Swelling often happens in looser tissues, such as around your eyes. ?Raise the injured area above the level of your heart while you are sitting or lying down. ?Follow your doctor's instructions about diet and how much fluid you can drink. ?This information is not intended to replace advice given to you by your health care provider. Make sure you discuss any questions you have with your health care provider. ?Document Revised: 09/08/2020 Document Reviewed: 09/08/2020 ?Elsevier Patient Education ? 2023 [employer] ? ?

## 2021-06-01 NOTE — Progress Notes (Signed)
Swelling in both ankles. ?Headaches. ?

## 2021-06-02 LAB — CBC WITH DIFFERENTIAL/PLATELET
Basophils Absolute: 0 10*3/uL (ref 0.0–0.2)
Basos: 1 %
EOS (ABSOLUTE): 0.2 10*3/uL (ref 0.0–0.4)
Eos: 2 %
Hematocrit: 38.2 % (ref 34.0–46.6)
Hemoglobin: 12.2 g/dL (ref 11.1–15.9)
Immature Grans (Abs): 0 10*3/uL (ref 0.0–0.1)
Immature Granulocytes: 0 %
Lymphocytes Absolute: 1.9 10*3/uL (ref 0.7–3.1)
Lymphs: 24 %
MCH: 29.3 pg (ref 26.6–33.0)
MCHC: 31.9 g/dL (ref 31.5–35.7)
MCV: 92 fL (ref 79–97)
Monocytes Absolute: 0.9 10*3/uL (ref 0.1–0.9)
Monocytes: 12 %
Neutrophils Absolute: 4.9 10*3/uL (ref 1.4–7.0)
Neutrophils: 61 %
Platelets: 246 10*3/uL (ref 150–450)
RBC: 4.16 x10E6/uL (ref 3.77–5.28)
RDW: 15.6 % — ABNORMAL HIGH (ref 11.7–15.4)
WBC: 7.9 10*3/uL (ref 3.4–10.8)

## 2021-06-02 LAB — CMP14+EGFR
ALT: 15 IU/L (ref 0–32)
AST: 21 IU/L (ref 0–40)
Albumin/Globulin Ratio: 1.4 (ref 1.2–2.2)
Albumin: 3.9 g/dL (ref 3.8–4.9)
Alkaline Phosphatase: 103 IU/L (ref 44–121)
BUN/Creatinine Ratio: 15 (ref 9–23)
BUN: 14 mg/dL (ref 6–24)
Bilirubin Total: 0.2 mg/dL (ref 0.0–1.2)
CO2: 26 mmol/L (ref 20–29)
Calcium: 9.1 mg/dL (ref 8.7–10.2)
Chloride: 105 mmol/L (ref 96–106)
Creatinine, Ser: 0.94 mg/dL (ref 0.57–1.00)
Globulin, Total: 2.7 g/dL (ref 1.5–4.5)
Glucose: 89 mg/dL (ref 70–99)
Potassium: 4.4 mmol/L (ref 3.5–5.2)
Sodium: 142 mmol/L (ref 134–144)
Total Protein: 6.6 g/dL (ref 6.0–8.5)
eGFR: 71 mL/min/{1.73_m2} (ref >59)

## 2021-06-02 LAB — LP+NON-HDL CHOLESTEROL
Cholesterol, Total: 129 mg/dL (ref 100–199)
HDL: 63 mg/dL (ref >39)
LDL Chol Calc (NIH): 54 mg/dL (ref 0–99)
Total Non-HDL-Chol (LDL+VLDL): 66 mg/dL (ref 0–129)
Triglycerides: 57 mg/dL (ref 0–149)
VLDL Cholesterol Cal: 12 mg/dL (ref 5–40)

## 2021-06-02 LAB — BRAIN NATRIURETIC PEPTIDE: BNP: 30 pg/mL (ref 0.0–100.0)

## 2021-06-03 DIAGNOSIS — J432 Centrilobular emphysema: Secondary | ICD-10-CM | POA: Diagnosis not present

## 2021-06-10 DIAGNOSIS — F4310 Post-traumatic stress disorder, unspecified: Secondary | ICD-10-CM | POA: Diagnosis not present

## 2021-06-10 DIAGNOSIS — F331 Major depressive disorder, recurrent, moderate: Secondary | ICD-10-CM | POA: Diagnosis not present

## 2021-06-13 ENCOUNTER — Other Ambulatory Visit: Payer: Self-pay | Admitting: Family Medicine

## 2021-06-13 DIAGNOSIS — R0982 Postnasal drip: Secondary | ICD-10-CM

## 2021-06-23 ENCOUNTER — Ambulatory Visit (INDEPENDENT_AMBULATORY_CARE_PROVIDER_SITE_OTHER): Payer: Medicaid Other | Admitting: Physical Medicine and Rehabilitation

## 2021-06-23 ENCOUNTER — Ambulatory Visit: Payer: Self-pay

## 2021-06-23 ENCOUNTER — Encounter: Payer: Self-pay | Admitting: Physical Medicine and Rehabilitation

## 2021-06-23 VITALS — BP 112/70 | HR 73

## 2021-06-23 DIAGNOSIS — M5416 Radiculopathy, lumbar region: Secondary | ICD-10-CM | POA: Diagnosis not present

## 2021-06-23 MED ORDER — METHYLPREDNISOLONE ACETATE 80 MG/ML IJ SUSP: 80.0000 mg | Freq: Once | INTRAMUSCULAR | Status: DC

## 2021-06-23 NOTE — Progress Notes (Unsigned)
Pt state lower back pain that travels to her buttocks and down to both legs. Pt state walking, standing and laying down makes the pain worse. Pt state she takes over the counter pain meds and uses heat to help ease her pain.  Numeric Pain Rating Scale and Functional Assessment Average Pain 9   In the last MONTH (on 0-10 scale) has pain interfered with the following?  1. General activity like being  able to carry out your everyday physical activities such as walking, climbing stairs, carrying groceries, or moving a chair?  Rating(10)   +Driver, -BT, -Dye Allergies.

## 2021-06-23 NOTE — Patient Instructions (Signed)

## 2021-06-27 ENCOUNTER — Other Ambulatory Visit: Payer: Self-pay | Admitting: Specialist

## 2021-07-03 ENCOUNTER — Ambulatory Visit (INDEPENDENT_AMBULATORY_CARE_PROVIDER_SITE_OTHER): Payer: Medicaid Other | Admitting: Pulmonary Disease

## 2021-07-03 ENCOUNTER — Encounter: Payer: Self-pay | Admitting: Pulmonary Disease

## 2021-07-03 ENCOUNTER — Other Ambulatory Visit: Payer: Self-pay | Admitting: General Practice

## 2021-07-03 ENCOUNTER — Encounter: Payer: Self-pay | Admitting: Family Medicine

## 2021-07-03 DIAGNOSIS — J432 Centrilobular emphysema: Secondary | ICD-10-CM

## 2021-07-03 DIAGNOSIS — J9611 Chronic respiratory failure with hypoxia: Secondary | ICD-10-CM

## 2021-07-03 DIAGNOSIS — J209 Acute bronchitis, unspecified: Secondary | ICD-10-CM | POA: Diagnosis not present

## 2021-07-03 DIAGNOSIS — J440 Chronic obstructive pulmonary disease with acute lower respiratory infection: Secondary | ICD-10-CM

## 2021-07-03 DIAGNOSIS — R918 Other nonspecific abnormal finding of lung field: Secondary | ICD-10-CM

## 2021-07-03 MED ORDER — ALBUTEROL SULFATE HFA 108 (90 BASE) MCG/ACT IN AERS: 2.0000 | INHALATION_SPRAY | Freq: Four times a day (QID) | RESPIRATORY_TRACT | 5 refills | Status: DC | PRN

## 2021-07-03 MED ORDER — SPIRIVA RESPIMAT 2.5 MCG/ACT IN AERS: 2.0000 | INHALATION_SPRAY | Freq: Every day | RESPIRATORY_TRACT | 12 refills | Status: DC

## 2021-07-03 MED ORDER — BUDESONIDE-FORMOTEROL FUMARATE 160-4.5 MCG/ACT IN AERO: 2.0000 | INHALATION_SPRAY | Freq: Two times a day (BID) | RESPIRATORY_TRACT | 5 refills | Status: DC

## 2021-07-03 NOTE — Progress Notes (Signed)
   Subjective:    Patient ID: Cheryl King, female    DOB: 12-07-1965, 56 y.o.   MRN: [id number]  HPI  56 yo smoker for FU of COPD She smokes about half pack per day starting as a teenager for 40 years, more than 20 pack years   She was treated for COPD flare in February with doxycycline and prednisone  She completed pulmonary rehab and was noted to desaturate to 84% and was started on oxygen 02/2021  54-monthfollow-up visit today She continues to smoke 3 cigarettes daily.  Compliant with oxygen during sleep and uses it in the daytime as needed. Compliant with Symbicort and Spiriva, has decreased use of albuterol.  She needs refills on these medications. She requests shower chair and a walker  Significant tests/ events reviewed  PFTs 02/2021 >> severe airway obstruction, ratio 52, FEV1 44%, FVC 67%, DLCO 33%  08/2020 CT chest >> 544mnodule RLL unchanged since 06/2019  Review of Systems neg for any significant sore throat, dysphagia, itching, sneezing, nasal congestion or excess/ purulent secretions, fever, chills, sweats, unintended wt loss, pleuritic or exertional cp, hempoptysis, orthopnea pnd or change in chronic leg swelling. Also denies presyncope, palpitations, heartburn, abdominal pain, nausea, vomiting, diarrhea or change in bowel or urinary habits, dysuria,hematuria, rash, arthralgias, visual complaints, headache, numbness weakness or ataxia.     Objective:   Physical Exam  Gen. Pleasant, well-nourished, in no distress ENT - no thrush, no pallor/icterus,no post nasal drip Neck: No JVD, no thyromegaly, no carotid bruits Lungs: no use of accessory muscles, no dullness to percussion, clear without rales or rhonchi  Cardiovascular: Rhythm regular, heart sounds  normal, no murmurs or gallops, no peripheral edema Musculoskeletal: No deformities, no cyanosis or clubbing        Assessment & Plan:

## 2021-07-03 NOTE — Assessment & Plan Note (Signed)
Continue Symbicort and Spiriva. We discussed COPD action plan.  We discussed signs and symptoms of COPD exacerbation

## 2021-07-03 NOTE — Patient Instructions (Signed)
  X LDCT chest in aug   X refills

## 2021-07-03 NOTE — Assessment & Plan Note (Signed)
Continue oxygen during exertion and sleep. She has borderline lung function but okay to stay off oxygen during rest

## 2021-07-03 NOTE — Assessment & Plan Note (Signed)
Stable nodule since 2021, last CT from 08/2020 reviewed. We will schedule I year follow-up CT chest

## 2021-07-04 DIAGNOSIS — J432 Centrilobular emphysema: Secondary | ICD-10-CM | POA: Diagnosis not present

## 2021-07-06 ENCOUNTER — Other Ambulatory Visit: Payer: Self-pay | Admitting: Family Medicine

## 2021-07-06 MED ORDER — MISC. DEVICES MISC: 0 refills | Status: AC

## 2021-07-07 DIAGNOSIS — K253 Acute gastric ulcer without hemorrhage or perforation: Secondary | ICD-10-CM | POA: Diagnosis not present

## 2021-07-07 DIAGNOSIS — Z1211 Encounter for screening for malignant neoplasm of colon: Secondary | ICD-10-CM | POA: Diagnosis not present

## 2021-07-09 DIAGNOSIS — M5441 Lumbago with sciatica, right side: Secondary | ICD-10-CM | POA: Diagnosis not present

## 2021-07-09 DIAGNOSIS — M5442 Lumbago with sciatica, left side: Secondary | ICD-10-CM | POA: Diagnosis not present

## 2021-07-09 DIAGNOSIS — M545 Low back pain, unspecified: Secondary | ICD-10-CM | POA: Diagnosis not present

## 2021-07-10 NOTE — Procedures (Signed)
Lumbosacral Transforaminal Epidural Steroid Injection - Sub-Pedicular Approach with Fluoroscopic Guidance  Patient: Cheryl King      Date of Birth: Mar 10, 1965 MRN: [id number] PCP: Hoy Register, MD      Visit Date: 06/23/2021   Universal Protocol:    Date/Time: 06/23/2021  Consent Given By: the patient  Position: PRONE  Additional Comments: Vital signs were monitored before and after the procedure. Patient was prepped and draped in the usual sterile fashion. The correct patient, procedure, and site was verified.   Injection Procedure Details:   Procedure diagnoses: Lumbar radiculopathy [M54.16]    Meds Administered:  Meds ordered this encounter  Medications   DISCONTD: methylPREDNISolone acetate (DEPO-MEDROL) injection 80 mg    Laterality: Bilateral  Location/Site: L5  Needle:5.0 in., 22 ga.  Short bevel or Quincke spinal needle  Needle Placement: Transforaminal  Findings:    -Comments: Excellent flow of contrast along the nerve, nerve root and into the epidural space.  Procedure Details: After squaring off the end-plates to get a true AP view, the C-arm was positioned so that an oblique view of the foramen as noted above was visualized. The target area is just inferior to the "nose of the scotty dog" or sub pedicular. The soft tissues overlying this structure were infiltrated with 2-3 ml. of 1% Lidocaine without Epinephrine.  The spinal needle was inserted toward the target using a "trajectory" view along the fluoroscope beam.  Under AP and lateral visualization, the needle was advanced so it did not puncture dura and was located close the 6 O'Clock position of the pedical in AP tracterory. Biplanar projections were used to confirm position. Aspiration was confirmed to be negative for CSF and/or blood. A 1-2 ml. volume of Isovue-250 was injected and flow of contrast was noted at each level. Radiographs were obtained for documentation purposes.   After attaining  the desired flow of contrast documented above, a 0.5 to 1.0 ml test dose of 0.25% Marcaine was injected into each respective transforaminal space.  The patient was observed for 90 seconds post injection.  After no sensory deficits were reported, and normal lower extremity motor function was noted,   the above injectate was administered so that equal amounts of the injectate were placed at each foramen (level) into the transforaminal epidural space.   Additional Comments:  The patient tolerated the procedure well Dressing: 2 x 2 sterile gauze and Band-Aid    Post-procedure details: Patient was observed during the procedure. Post-procedure instructions were reviewed.  Patient left the clinic in stable condition.

## 2021-07-10 NOTE — Progress Notes (Signed)
Cheryl King - 56 y.o. female MRN [id number]  Date of birth: [birth year]  Office Visit Note: Visit Date: 06/23/2021 PCP: Hoy Register, MD Referred by: Hoy Register, MD  Subjective: Chief Complaint  Patient presents with   Lower Back - Pain   Right Leg - Pain   Left Leg - Pain   HPI:  Cheryl King is a 56 y.o. female who comes in today at the request of Dr. Vira Browns for planned Bilateral L5-S1 Lumbar Transforaminal epidural steroid injection with fluoroscopic guidance.  The patient has failed conservative care including home exercise, medications, time and activity modification.  This injection will be diagnostic and hopefully therapeutic.  Please see requesting physician notes for further details and justification.   ROS Otherwise per HPI.  Assessment & Plan: Visit Diagnoses:    ICD-10-CM   1. Lumbar radiculopathy  M54.16 XR C-ARM NO REPORT    Epidural Steroid injection    DISCONTINUED: methylPREDNISolone acetate (DEPO-MEDROL) injection 80 mg      Plan: No additional findings.   Meds & Orders:  Meds ordered this encounter  Medications   DISCONTD: methylPREDNISolone acetate (DEPO-MEDROL) injection 80 mg    Orders Placed This Encounter  Procedures   XR C-ARM NO REPORT   Epidural Steroid injection    Follow-up: Return for visit to requesting provider as needed.   Procedures: No procedures performed  Lumbosacral Transforaminal Epidural Steroid Injection - Sub-Pedicular Approach with Fluoroscopic Guidance  Patient: Cheryl King      Date of Birth: [birth year] MRN: [id number] PCP: Hoy Register, MD      Visit Date: 06/23/2021   Universal Protocol:    Date/Time: 06/23/2021  Consent Given By: the patient  Position: PRONE  Additional Comments: Vital signs were monitored before and after the procedure. Patient was prepped and draped in the usual sterile fashion. The correct patient, procedure, and site was verified.   Injection  Procedure Details:   Procedure diagnoses: Lumbar radiculopathy [M54.16]    Meds Administered:  Meds ordered this encounter  Medications   DISCONTD: methylPREDNISolone acetate (DEPO-MEDROL) injection 80 mg    Laterality: Bilateral  Location/Site: L5  Needle:5.0 in., 22 ga.  Short bevel or Quincke spinal needle  Needle Placement: Transforaminal  Findings:    -Comments: Excellent flow of contrast along the nerve, nerve root and into the epidural space.  Procedure Details: After squaring off the end-plates to get a true AP view, the C-arm was positioned so that an oblique view of the foramen as noted above was visualized. The target area is just inferior to the "nose of the scotty dog" or sub pedicular. The soft tissues overlying this structure were infiltrated with 2-3 ml. of 1% Lidocaine without Epinephrine.  The spinal needle was inserted toward the target using a "trajectory" view along the fluoroscope beam.  Under AP and lateral visualization, the needle was advanced so it did not puncture dura and was located close the 6 O'Clock position of the pedical in AP tracterory. Biplanar projections were used to confirm position. Aspiration was confirmed to be negative for CSF and/or blood. A 1-2 ml. volume of Isovue-250 was injected and flow of contrast was noted at each level. Radiographs were obtained for documentation purposes.   After attaining the desired flow of contrast documented above, a 0.5 to 1.0 ml test dose of 0.25% Marcaine was injected into each respective transforaminal space.  The patient was observed for 90 seconds post injection.  After no sensory deficits were reported, and normal lower extremity  motor function was noted,   the above injectate was administered so that equal amounts of the injectate were placed at each foramen (level) into the transforaminal epidural space.   Additional Comments:  The patient tolerated the procedure well Dressing: 2 x 2 sterile gauze and  Band-Aid    Post-procedure details: Patient was observed during the procedure. Post-procedure instructions were reviewed.  Patient left the clinic in stable condition.    Clinical History: MRI LUMBAR SPINE WITHOUT AND WITH CONTRAST   TECHNIQUE: Multiplanar and multiecho pulse sequences of the lumbar spine were obtained without and with intravenous contrast.   CONTRAST:  15mL MULTIHANCE GADOBENATE DIMEGLUMINE 529 MG/ML IV SOLN   COMPARISON:  01/05/2017   FINDINGS: Segmentation:  5 lumbar type vertebrae   Alignment:  Mild scoliosis.  No listhesis   Vertebrae: No fracture, evidence of discitis, or bone lesion. Endplate sclerosis has developed at L2-3 and L5-S1, degenerative appearing.   Conus medullaris and cauda equina: Conus extends to the L1 level. Conus and cauda equina appear normal.   Paraspinal and other soft tissues: No perispinal mass or inflammation noted.   Disc levels:   T11-12: Chronic disc collapse with ventral protrusion.   T12- L1: Unremarkable.   L1-L2: Disc narrowing and bulging with endplate degeneration. Patent canal and foramina   L2-L3: Disc narrowing and bulging with endplate degeneration. Circumferential disc bulging and facet spurring. Mild or moderate left foraminal narrowing   L3-L4: Disc narrowing and bulging with facet spurring. Patent canal and foramina   L4-L5: Disc narrowing and bulging. Degenerative facet spurring greater on the right. Mild-to-moderate right foraminal narrowing   L5-S1:Disc collapse and endplate degeneration with bulge. Degenerative facet spurring on the right more than left. Biforaminal impingement, worse on the right and progressed.   IMPRESSION: 1. Generalized lumbar spine degeneration with progression from 2018, especially at L5-S1 where there is right more than left foraminal impingement. 2. Diffusely patent spinal canal. 3. Mild scoliosis.     Electronically Signed   By: Tiburcio Pea M.D.    On: 09/30/2020 09:57     Objective:  VS:  HT:    WT:   BMI:     BP:112/70  HR:73bpm  TEMP: ( )  RESP:  Physical Exam Vitals and nursing note reviewed.  Constitutional:      General: She is not in acute distress.    Appearance: Normal appearance. She is not ill-appearing.  HENT:     Head: Normocephalic and atraumatic.     Right Ear: External ear normal.     Left Ear: External ear normal.  Eyes:     Extraocular Movements: Extraocular movements intact.  Cardiovascular:     Rate and Rhythm: Normal rate.     Pulses: Normal pulses.  Pulmonary:     Effort: Pulmonary effort is normal. No respiratory distress.  Abdominal:     General: There is no distension.     Palpations: Abdomen is soft.  Musculoskeletal:        General: Tenderness present.     Cervical back: Neck supple.     Right lower leg: No edema.     Left lower leg: No edema.     Comments: Patient has good distal strength with no pain over the greater trochanters.  No clonus or focal weakness.  Skin:    Findings: No erythema, lesion or rash.  Neurological:     General: No focal deficit present.     Mental Status: She is alert and oriented to  person, place, and time.     Sensory: No sensory deficit.     Motor: No weakness or abnormal muscle tone.     Coordination: Coordination normal.  Psychiatric:        Mood and Affect: Mood normal.        Behavior: Behavior normal.      Imaging: No results found.

## 2021-07-22 DIAGNOSIS — F331 Major depressive disorder, recurrent, moderate: Secondary | ICD-10-CM | POA: Diagnosis not present

## 2021-07-22 DIAGNOSIS — F4310 Post-traumatic stress disorder, unspecified: Secondary | ICD-10-CM | POA: Diagnosis not present

## 2021-08-01 ENCOUNTER — Other Ambulatory Visit: Payer: Self-pay | Admitting: Specialist

## 2021-08-03 DIAGNOSIS — J432 Centrilobular emphysema: Secondary | ICD-10-CM | POA: Diagnosis not present

## 2021-08-05 DIAGNOSIS — Z1211 Encounter for screening for malignant neoplasm of colon: Secondary | ICD-10-CM | POA: Diagnosis not present

## 2021-08-05 DIAGNOSIS — K253 Acute gastric ulcer without hemorrhage or perforation: Secondary | ICD-10-CM | POA: Diagnosis not present

## 2021-08-05 DIAGNOSIS — K219 Gastro-esophageal reflux disease without esophagitis: Secondary | ICD-10-CM | POA: Diagnosis not present

## 2021-08-10 ENCOUNTER — Ambulatory Visit
Admission: RE | Admit: 2021-08-10 | Discharge: 2021-08-10 | Disposition: A | Discharge status: Home / Self Care | Payer: Medicaid Other | Source: Ambulatory Visit | Attending: Pulmonary Disease | Admitting: Pulmonary Disease

## 2021-08-10 DIAGNOSIS — J432 Centrilobular emphysema: Secondary | ICD-10-CM

## 2021-08-17 ENCOUNTER — Encounter: Payer: Self-pay | Admitting: Specialist

## 2021-08-17 ENCOUNTER — Other Ambulatory Visit: Payer: Self-pay | Admitting: Cardiology

## 2021-08-17 DIAGNOSIS — E7800 Pure hypercholesterolemia, unspecified: Secondary | ICD-10-CM

## 2021-08-27 ENCOUNTER — Ambulatory Visit: Payer: Self-pay

## 2021-08-27 ENCOUNTER — Ambulatory Visit: Payer: Medicaid Other | Admitting: Specialist

## 2021-08-27 ENCOUNTER — Encounter: Payer: Self-pay | Admitting: Specialist

## 2021-08-27 VITALS — BP 95/69 | HR 78 | Ht 65.0 in | Wt 162.0 lb

## 2021-08-27 DIAGNOSIS — G8929 Other chronic pain: Secondary | ICD-10-CM

## 2021-08-27 DIAGNOSIS — M7061 Trochanteric bursitis, right hip: Secondary | ICD-10-CM

## 2021-08-27 DIAGNOSIS — M5416 Radiculopathy, lumbar region: Secondary | ICD-10-CM

## 2021-08-27 DIAGNOSIS — M7062 Trochanteric bursitis, left hip: Secondary | ICD-10-CM | POA: Diagnosis not present

## 2021-08-27 DIAGNOSIS — M5136 Other intervertebral disc degeneration, lumbar region: Secondary | ICD-10-CM | POA: Diagnosis not present

## 2021-08-27 DIAGNOSIS — M4726 Other spondylosis with radiculopathy, lumbar region: Secondary | ICD-10-CM | POA: Diagnosis not present

## 2021-08-27 DIAGNOSIS — M5441 Lumbago with sciatica, right side: Secondary | ICD-10-CM

## 2021-08-27 DIAGNOSIS — M5442 Lumbago with sciatica, left side: Secondary | ICD-10-CM

## 2021-08-27 MED ORDER — METHYLPREDNISOLONE ACETATE 40 MG/ML IJ SUSP
40.0000 mg | INTRAMUSCULAR | Status: AC | PRN
  Administered 2021-08-27: 40 mg via INTRA_ARTICULAR

## 2021-08-27 MED ORDER — LIDOCAINE HCL 1 % IJ SOLN
5.0000 mL | INTRAMUSCULAR | Status: AC | PRN
  Administered 2021-08-27: 5 mL

## 2021-08-27 MED ORDER — PREGABALIN 75 MG PO CAPS: 75.0000 mg | ORAL_CAPSULE | Freq: Three times a day (TID) | ORAL | 5 refills | Status: DC

## 2021-08-27 NOTE — Progress Notes (Signed)
Office Visit Note   Patient: Cheryl King           Date of Birth: 1965-02-15           MRN: [id number] Visit Date: 08/27/2021              Requested by: Charlott Rakes, MD [place],  [place] [id number] PCP: Charlott Rakes, MD   Assessment & Plan: Visit Diagnoses:  1. Lumbar radiculopathy   2. Other spondylosis with radiculopathy, lumbar region   3. Degenerative disc disease, lumbar   4. Trochanteric bursitis, right hip   5. Greater trochanteric bursitis, right   6. Greater trochanteric bursitis, left   7. Chronic midline low back pain with bilateral sciatica     Plan: Avoid frequent bending and stooping  No lifting greater than 10 lbs. May use ice or moist heat for pain. Weight loss is of benefit. Best medication for lumbar disc disease is arthritis medications like motrin, celebrex and naprosyn. Exercise is important to improve your indurance and does allow people to function better inspite of back pain.  Avoid bending, stooping and avoid lifting weights greater than 10 lbs. Avoid prolong standing and walking. Avoid frequent bending and stooping  No lifting greater than 10 lbs. May use ice or moist heat for pain. Weight loss is of benefit. Permanent disabled Handicap license is approved.    Follow-Up Instructions: Return in about 6 months (around   Follow-Up Instructions: No follow-ups on file.   Orders:  Orders Placed This Encounter  Procedures   Large Joint Inj: bilateral greater trochanter   XR Lumbar Spine 2-3 Views   No orders of the defined types were placed in this encounter.     Procedures: Large Joint Inj: bilateral greater trochanter on 08/27/2021 12:34 PM Indications: pain Details: 22 G 3.5 in needle, superolateral approach  Arthrogram: No  Medications (Right): 5 mL lidocaine 1 %; 40 mg methylPREDNISolone acetate 40 MG/ML Medications (Left): 5 mL lidocaine 1 %; 40 mg methylPREDNISolone acetate 40 MG/ML Outcome:  tolerated well, no immediate complications Procedure, treatment alternatives, risks and benefits explained, specific risks discussed. Consent was given by the patient. Immediately prior to procedure a time out was called to verify the correct patient, procedure, equipment, support staff and site/side marked as required. Patient was prepped and draped in the usual sterile fashion.      Clinical Data: No additional findings.   Subjective: Chief Complaint  Patient presents with   Lower Back - Pain   Right Leg - Pain   Left Leg - Pain    56 year old female with history of back pain lumbar degenerative disc disease With spondylosis. Having worsening pain into both legs all the way down the whole leg. The pain is down the whole leg into the feet top and bottom. Pain comes and goes wether standing or walking or sitting. No bowel or bladder difficulty. Walks slowly but is able to walk. She is able to grocery shop and it takes awhile to do if she goes. She has no pain with cough or sneeze. There is numbness and tingling down to the feet with tingling. Bending and stooping repetitively is painful. Has a burning pain in the right upper buttock that comes and goes, not everyday.     Review of Systems  Constitutional: Negative.   HENT: Negative.    Eyes: Negative.   Respiratory: Negative.    Cardiovascular: Negative.   Gastrointestinal: Negative.   Endocrine:  Negative.   Genitourinary: Negative.   Musculoskeletal: Negative.   Skin: Negative.   Allergic/Immunologic: Negative.   Neurological: Negative.   Hematological: Negative.   Psychiatric/Behavioral: Negative.       Objective: Vital Signs: BP 95/69 (BP Location: Left Arm, Patient Position: Sitting)   Pulse 78   Ht '5\' 5"'$  (1.651 m)   Wt 162 lb (73.5 kg)   BMI 26.96 kg/m   Physical Exam Constitutional:      Appearance: She is well-developed.  HENT:     Head: Normocephalic and atraumatic.  Eyes:     Pupils: Pupils are equal,  round, and reactive to light.  Pulmonary:     Effort: Pulmonary effort is normal.     Breath sounds: Normal breath sounds.  Abdominal:     General: Bowel sounds are normal.     Palpations: Abdomen is soft.  Musculoskeletal:     Cervical back: Normal range of motion and neck supple.     Lumbar back: Positive right straight leg raise test and positive left straight leg raise test.  Skin:    General: Skin is warm and dry.  Neurological:     Mental Status: She is alert and oriented to person, place, and time.  Psychiatric:        Behavior: Behavior normal.        Thought Content: Thought content normal.        Judgment: Judgment normal.    Right Hip Exam   Tenderness  The patient is experiencing tenderness in the greater trochanter.  Range of Motion  Abduction:  abnormal   Muscle Strength  Abduction: 5/5   Tests  Ober: positive  Other  Sensation: normal Pulse: present   Left Hip Exam   Tenderness  The patient is experiencing tenderness in the greater trochanter.  Muscle Strength  Abduction: 5/5   Tests  Ober: positive  Other  Sensation: normal Pulse: present   Back Exam   Tenderness  The patient is experiencing tenderness in the lumbar.  Range of Motion  Extension:  abnormal  Flexion:  abnormal  Lateral bend right:  abnormal  Lateral bend left:  abnormal  Rotation right:  abnormal  Rotation left:  abnormal   Muscle Strength  Right Quadriceps:  5/5  Left Quadriceps:  5/5  Right Hamstrings:  5/5  Left Hamstrings:  5/5   Tests  Straight leg raise right: positive Straight leg raise left: positive  Other  Toe walk: normal Heel walk: normal Sensation: normal     Specialty Comments:  MRI LUMBAR SPINE WITHOUT AND WITH CONTRAST   TECHNIQUE: Multiplanar and multiecho pulse sequences of the lumbar spine were obtained without and with intravenous contrast.   CONTRAST:  47m MULTIHANCE GADOBENATE DIMEGLUMINE 529 MG/ML IV SOLN   COMPARISON:   01/05/2017   FINDINGS: Segmentation:  5 lumbar type vertebrae   Alignment:  Mild scoliosis.  No listhesis   Vertebrae: No fracture, evidence of discitis, or bone lesion. Endplate sclerosis has developed at L2-3 and L5-S1, degenerative appearing.   Conus medullaris and cauda equina: Conus extends to the L1 level. Conus and cauda equina appear normal.   Paraspinal and other soft tissues: No perispinal mass or inflammation noted.   Disc levels:   T11-12: Chronic disc collapse with ventral protrusion.   T12- L1: Unremarkable.   L1-L2: Disc narrowing and bulging with endplate degeneration. Patent canal and foramina   L2-L3: Disc narrowing and bulging with endplate degeneration. Circumferential disc bulging and facet spurring. Mild  or moderate left foraminal narrowing   L3-L4: Disc narrowing and bulging with facet spurring. Patent canal and foramina   L4-L5: Disc narrowing and bulging. Degenerative facet spurring greater on the right. Mild-to-moderate right foraminal narrowing   L5-S1:Disc collapse and endplate degeneration with bulge. Degenerative facet spurring on the right more than left. Biforaminal impingement, worse on the right and progressed.   IMPRESSION: 1. Generalized lumbar spine degeneration with progression from 2018, especially at L5-S1 where there is right more than left foraminal impingement. 2. Diffusely patent spinal canal. 3. Mild scoliosis.     Electronically Signed   By: Jorje Guild M.D.   On: 09/30/2020 09:57  Imaging: No results found.   PMFS History: Patient Active Problem List   Diagnosis Date Noted   Chronic respiratory failure with hypoxia ([facility]) 03/11/2021   Centrilobular emphysema ([facility]) 07/29/2020   Multiple nodules of lung 07/29/2020   Hyperlipidemia 07/18/2017   Ulnar neuropathy 04/18/2017   GERD (gastroesophageal reflux disease) 10/19/2016   Prediabetes 05/03/2016   Cubital tunnel syndrome on right 03/01/2016   Numbness  and tingling of right arm 01/01/2016   Chlamydia infection 01/27/2015   Tobacco abuse 12/26/2013   Chronic lower back pain 04/13/2013   Sciatica 04/13/2013   Chronic neck pain 04/13/2013   Spondylosis of cervical spine 04/13/2013   Past Medical History:  Diagnosis Date   Acid reflux    Allergy    Carpal tunnel syndrome    COPD (chronic obstructive pulmonary disease) (HCC)    Depression    Diverticulitis    Hyperlipidemia    Low back pain    Neck pain    Sacroiliac inflammation ([place])    Sciatica    Seasonal allergies     Family History  Problem Relation Age of Onset   Healthy Mother    Other Father        Unsure of medical history   Asthma Maternal Aunt    Diabetes Maternal Aunt    Cancer Maternal Aunt    Breast cancer Maternal Aunt    Hypertension Maternal Grandmother    Heart Problems Maternal Grandmother    Hypertension Other    COPD Other    Colon cancer Neg Hx    Pancreatic cancer Neg Hx    Rectal cancer Neg Hx    Stomach cancer Neg Hx    Colon polyps Neg Hx    Esophageal cancer Neg Hx     Past Surgical History:  Procedure Laterality Date   ABDOMINAL SURGERY     ANKLE SURGERY     lt.   BREAST EXCISIONAL BIOPSY Right    pt states years ago- not sure when   PARTIAL HYSTERECTOMY     right breast cyst removed      URETHRAL DIVERTICULUM REPAIR     uretrral diverticultis   Social History   Occupational History   Occupation: Unemployed   Occupation: past Scientist, water quality, Radio broadcast assistant at [employer], office positions, Scientist, product/process development.  Tobacco Use   Smoking status: Every Day    Packs/day: 0.25    Years: 35.00    Total pack years: 8.75    Types: Cigarettes    Start date: 35   Smokeless tobacco: Never   Tobacco comments:    Wears patches, 3 cigarettes smoked on some days especially the days with stress.  Still trying to quit  Vaping Use   Vaping Use: Never used  Substance and Sexual Activity   Alcohol use: Not Currently  Comment: Social  only - seldom   Drug use: No   Sexual activity: Never

## 2021-08-27 NOTE — Patient Instructions (Signed)
Plan: Avoid frequent bending and stooping  No lifting greater than 10 lbs. May use ice or moist heat for pain. Weight loss is of benefit. Best medication for lumbar disc disease is arthritis medications like motrin, celebrex and naprosyn. Exercise is important to improve your indurance and does allow people to function better inspite of back pain.  Avoid bending, stooping and avoid lifting weights greater than 10 lbs. Avoid prolong standing and walking. Avoid frequent bending and stooping  No lifting greater than 10 lbs. May use ice or moist heat for pain. Weight loss is of benefit. Permanent disabled Handicap license is approved.    Follow-Up Instructions: Return in about 6 months (around

## 2021-09-02 ENCOUNTER — Telehealth: Payer: Self-pay | Admitting: Physical Medicine and Rehabilitation

## 2021-09-02 NOTE — Telephone Encounter (Signed)
Pt called to set an appt. Please call pt at [phone]

## 2021-09-17 ENCOUNTER — Encounter: Payer: Self-pay | Admitting: Physical Medicine and Rehabilitation

## 2021-09-17 ENCOUNTER — Ambulatory Visit (INDEPENDENT_AMBULATORY_CARE_PROVIDER_SITE_OTHER): Payer: Medicaid Other | Admitting: Physical Medicine and Rehabilitation

## 2021-09-17 DIAGNOSIS — R202 Paresthesia of skin: Secondary | ICD-10-CM

## 2021-09-17 NOTE — Progress Notes (Signed)
Cheryl King - 56 y.o. female MRN [id number]  Date of birth: Dec 06, 1965  Office Visit Note: Visit Date: 09/17/2021 PCP: Charlott Rakes, MD Referred by: Charlott Rakes, MD  Subjective: Chief Complaint  Patient presents with   Right Leg - Pain   Left Leg - Pain   HPI:  Cheryl King is a 56 y.o. female who comes in today at the request of Dr. Basil Dess for electrodiagnostic study of the Bilateral lower extremities.  Patient reports chronic worsening severe bilateral leg pain somewhat nondermatomal.  She denies focal weakness but does feel weak at times.  Dr. Louanne Skye wanted to rule out radiculopathy with history of foraminal narrowing in the lumbar spine.  She has had 2 prior electrodiagnostic studies of the upper limbs both in 2017 and 2019 which were both normal.  History of multi limb paresthesias in general.   ROS Otherwise per HPI.  Assessment & Plan: Visit Diagnoses:    ICD-10-CM   1. Paresthesia of skin  R20.2 NCV with EMG (electromyography)      Plan: Impression: Essentially NORMAL electrodiagnostic study of both lower limbs.  There is no significant electrodiagnostic evidence of nerve entrapment, lumbar radiculopathy or generalized peripheral neuropathy.  I feel like there was technical artifact with the sensory nerve conductions but cannot rule out an underlying sensory predominant polyneuropathy.  **As you know, purely sensory or demyelinating radiculopathies and chemical radiculitis may not be detected with this particular electrodiagnostic study.  Recommendations: 1.  Follow-up with referring physician. 2.  Continue current management of symptoms.  Meds & Orders: No orders of the defined types were placed in this encounter.   Orders Placed This Encounter  Procedures   NCV with EMG (electromyography)    Follow-up: Return for Basil Dess, MD as scheduled.   Procedures: No procedures performed  EMG & NCV Findings: Evaluation of the right fibular  motor, the right tibial motor, and the left sural sensory nerves showed reduced amplitude (R1.2, R1.0, L1.6 V).  The left superficial fibular sensory nerve showed no response (14 cm).  The right superficial fibular sensory nerve showed prolonged distal peak latency (4.7 ms) and decreased conduction velocity (14 cm-Ant Lat Mall, 30 m/s).  The right sural sensory nerve showed prolonged distal peak latency (4.1 ms) and decreased conduction velocity (Calf-Lat Mall, 34 m/s).  All remaining nerves (as indicated in the following tables) were within normal limits.  All left vs. right side differences were within normal limits.    All examined muscles (as indicated in the following table) showed no evidence of electrical instability.    Impression: Essentially NORMAL electrodiagnostic study of both lower limbs.  There is no significant electrodiagnostic evidence of nerve entrapment, lumbar radiculopathy or generalized peripheral neuropathy.  I feel like there was technical artifact with the sensory nerve conductions but cannot rule out an underlying sensory predominant polyneuropathy.  **As you know, purely sensory or demyelinating radiculopathies and chemical radiculitis may not be detected with this particular electrodiagnostic study.  Recommendations: 1.  Follow-up with referring physician. 2.  Continue current management of symptoms.  ___________________________ Laurence Spates FAAPMR Board Certified, American Board of Physical Medicine and Rehabilitation    Nerve Conduction Studies Anti Sensory Summary Table   Stim Site NR Peak (ms) Norm Peak (ms) P-T Amp (V) Norm P-T Amp Site1 Site2 Delta-P (ms) Dist (cm) Vel (m/s) Norm Vel (m/s)  Left Sup Fibular Anti Sensory (Ant Lat Mall)  29.5C  14 cm *NR  <4.4  >5.0 14 cm Ant Lat  Mall  14.0  >32  Right Sup Fibular Anti Sensory (Ant Lat Mall)  29.6C  14 cm    *4.7 <4.4 8.0 >5.0 14 cm Ant Lat Mall 4.7 14.0 *30 >32  Left Sural Anti Sensory (Lat Mall)   29.7C  Calf    3.9 <4.0 *1.6 >5.0 Calf Lat Mall 3.9 14.0 36 >35  Right Sural Anti Sensory (Lat Mall)  29.8C  Calf    *4.1 <4.0 9.0 >5.0 Calf Lat Mall 4.1 14.0 *34 >35   Motor Summary Table   Stim Site NR Onset (ms) Norm Onset (ms) O-P Amp (mV) Norm O-P Amp Site1 Site2 Delta-0 (ms) Dist (cm) Vel (m/s) Norm Vel (m/s)  Left Dp Br Fibular Motor (AntTibialis)  30C  Fib Head    2.8 <4.2 2.0  Poplit Fib Head 1.6 8.0 50 >40.5  Poplit    4.4 <5.7 1.7         Left Fibular Motor (Ext Dig Brev)  29.6C    actually tibial  Ankle    6.0 <6.1 2.6 >2.5 B Fib Ankle 9.3 39.0 42 >38  B Fib    15.3  5.0         Right Fibular Motor (Ext Dig Brev)  29.4C  Ankle    4.4 <6.1 *1.2 >2.5 B Fib Ankle 6.5 31.0 48 >38  B Fib    10.9  0.7  Poplt B Fib 1.3 10.0 77 >40  Poplt    12.2  4.8         Right Tibial Motor (Abd Hall Brev)  29.5C  Ankle    5.4 <6.1 *1.0 >3.0 Knee Ankle 6.5 36.0 55 >35  Knee    11.9  7.7          EMG   Side Muscle Nerve Root Ins Act Fibs Psw Amp Dur Poly Recrt Int Fraser Din Comment  Right AntTibialis Dp Br Peron L4-5 Nml Nml Nml Nml Nml 0 Nml Nml   Right Fibularis Longus  Sup Br Peron L5-S1 Nml Nml Nml Nml Nml 0 Nml Nml   Right MedGastroc Tibial S1-2 Nml Nml Nml Nml Nml 0 Nml Nml   Right VastusMed Femoral L2-4 Nml Nml Nml Nml Nml 0 Nml Nml   Right BicepsFemS Sciatic L5-S1 Nml Nml Nml Nml Nml 0 Nml Nml   Left AntTibialis Dp Br Peron L4-5 Nml Nml Nml Nml Nml 0 Nml Nml   Left Fibularis Longus  Sup Br Peron L5-S1 Nml Nml Nml Nml Nml 0 Nml Nml   Left MedGastroc Tibial S1-2 Nml Nml Nml Nml Nml 0 Nml Nml   Left VastusMed Femoral L2-4 Nml Nml Nml Nml Nml 0 Nml Nml   Left BicepsFemS Sciatic L5-S1 Nml Nml Nml Nml Nml 0 Nml Nml     Nerve Conduction Studies Anti Sensory Left/Right Comparison   Stim Site L Lat (ms) R Lat (ms) L-R Lat (ms) L Amp (V) R Amp (V) L-R Amp (%) Site1 Site2 L Vel (m/s) R Vel (m/s) L-R Vel (m/s)  Sup Fibular Anti Sensory (Ant Lat Mall)  29.5C  14 cm  *4.7   8.0  14 cm  Ant Lat Mall  30   Sural Anti Sensory (Lat Mall)  29.7C  Calf 3.9 *4.1 0.2 *1.6 9.0 82.2 Calf Lat Mall 36 *34 2   Motor Left/Right Comparison   Stim Site L Lat (ms) R Lat (ms) L-R Lat (ms) L Amp (mV) R Amp (mV) L-R Amp (%) Site1 Site2 L Vel (m/s) R Vel (  m/s) L-R Vel (m/s)  Dp Br Fibular Motor (AntTibialis)  30C  Fib Head 2.8   2.0   Poplit Fib Head 50    Poplit 4.4   1.7         Fibular Motor (Ext Dig Brev)  29.6C    actually tibial  Ankle 6.0 4.4 1.6 2.6 *1.2 53.8 B Fib Ankle 42 48 6  B Fib 15.3 10.9 4.4 5.0 0.7 86.0       Tibial Motor (Abd Hall Brev)  29.5C  Ankle  5.4   *1.0  Knee Ankle  55   Knee  11.9   7.7           Waveforms:                  Clinical History: MRI LUMBAR SPINE WITHOUT AND WITH CONTRAST    COMPARISON:  01/05/2017   FINDINGS: Segmentation:  5 lumbar type vertebrae   Alignment:  Mild scoliosis.  No listhesis   Vertebrae: No fracture, evidence of discitis, or bone lesion. Endplate sclerosis has developed at L2-3 and L5-S1, degenerative appearing.   Conus medullaris and cauda equina: Conus extends to the L1 level. Conus and cauda equina appear normal.   Paraspinal and other soft tissues: No perispinal mass or inflammation noted.   Disc levels:   T11-12: Chronic disc collapse with ventral protrusion.   T12- L1: Unremarkable.   L1-L2: Disc narrowing and bulging with endplate degeneration. Patent canal and foramina   L2-L3: Disc narrowing and bulging with endplate degeneration. Circumferential disc bulging and facet spurring. Mild or moderate left foraminal narrowing   L3-L4: Disc narrowing and bulging with facet spurring. Patent canal and foramina   L4-L5: Disc narrowing and bulging. Degenerative facet spurring greater on the right. Mild-to-moderate right foraminal narrowing   L5-S1:Disc collapse and endplate degeneration with bulge. Degenerative facet spurring on the right more than left. Biforaminal impingement, worse on the  right and progressed.   IMPRESSION: 1. Generalized lumbar spine degeneration with progression from 2018, especially at L5-S1 where there is right more than left foraminal impingement. 2. Diffusely patent spinal canal. 3. Mild scoliosis.     Electronically Signed   By: Jorje Guild M.D.   On: 09/30/2020 09:57 ---- 02/17/2017 EMG/NCS Impression: Essentially NORMAL electrodiagnostic study of the left upper limb.  There is no significant electrodiagnostic evidence of nerve entrapment, brachial plexopathy or cervical radiculopathy.     As you know, purely sensory or demyelinating radiculopathies and chemical radiculitis may not be detected with this particular electrodiagnostic study.   Recommendations: 1.  Follow-up with referring physician. 2.  Continue current management of symptoms.  ------  01/01/2016 EMG/NCS Impression: Essentially NORMAL electrodiagnostic study of both upper limbs.  There was once again very very mild distal slowing at the ulnar nerve at the wrist.This represents no essential change from the prior study in 2016   There is no significant electrodiagnostic evidence of nerve entrapment, brachial plexopathy, cervical radiculopathy or generalized peripheral neuropathy.  As you know, purely sensory or demyelinating radiculopathies and chemical radiculitis may not be detected with this particular electrodiagnostic study.   Recommendations: 1.  Follow-up with referring physician. 2.  Continue current management of symptoms.     Objective:  VS:  HT:    WT:   BMI:     BP:   HR: bpm  TEMP: ( )  RESP:  Physical Exam Musculoskeletal:        General: No swelling, tenderness or deformity.  Comments: Inspection reveals mild intrinsic foot atrophy.  There is some very localized edema about the ankles bilaterally.. There is intact sensation to light touch in all dermatomal and peripheral nerve distributions. There is a negative Hoffmann's test bilaterally.   Skin:    General: Skin is warm and dry.     Findings: No erythema or rash.  Neurological:     General: No focal deficit present.     Mental Status: She is alert and oriented to person, place, and time.     Motor: No weakness or abnormal muscle tone.     Coordination: Coordination normal.  Psychiatric:        Mood and Affect: Mood normal.        Behavior: Behavior normal.      Imaging: No results found.

## 2021-09-17 NOTE — Progress Notes (Signed)
Pt state pain in both legs. Pt state walking cause a lot of pain. Pt state she takes pain meds to help ease her pain.  Numeric Pain Rating Scale and Functional Assessment Average Pain 8   In the last MONTH (on 0-10 scale) has pain interfered with the following?  1. General activity like being  able to carry out your everyday physical activities such as walking, climbing stairs, carrying groceries, or moving a chair?  Rating(10)   -BT,

## 2021-09-23 NOTE — Procedures (Signed)
EMG & NCV Findings: Evaluation of the right fibular motor, the right tibial motor, and the left sural sensory nerves showed reduced amplitude (R1.2, R1.0, L1.6 V).  The left superficial fibular sensory nerve showed no response (14 cm).  The right superficial fibular sensory nerve showed prolonged distal peak latency (4.7 ms) and decreased conduction velocity (14 cm-Ant Lat Mall, 30 m/s).  The right sural sensory nerve showed prolonged distal peak latency (4.1 ms) and decreased conduction velocity (Calf-Lat Mall, 34 m/s).  All remaining nerves (as indicated in the following tables) were within normal limits.  All left vs. right side differences were within normal limits.    All examined muscles (as indicated in the following table) showed no evidence of electrical instability.    Impression: Essentially NORMAL electrodiagnostic study of both lower limbs.  There is no significant electrodiagnostic evidence of nerve entrapment, lumbar radiculopathy or generalized peripheral neuropathy.  I feel like there was technical artifact with the sensory nerve conductions but cannot rule out an underlying sensory predominant polyneuropathy.  **As you know, purely sensory or demyelinating radiculopathies and chemical radiculitis may not be detected with this particular electrodiagnostic study.  Recommendations: 1.  Follow-up with referring physician. 2.  Continue current management of symptoms.  ___________________________ Laurence Spates FAAPMR Board Certified, American Board of Physical Medicine and Rehabilitation    Nerve Conduction Studies Anti Sensory Summary Table   Stim Site NR Peak (ms) Norm Peak (ms) P-T Amp (V) Norm P-T Amp Site1 Site2 Delta-P (ms) Dist (cm) Vel (m/s) Norm Vel (m/s)  Left Sup Fibular Anti Sensory (Ant Lat Mall)  29.5C  14 cm *NR  <4.4  >5.0 14 cm Ant Lat Mall  14.0  >32  Right Sup Fibular Anti Sensory (Ant Lat Mall)  29.6C  14 cm    *4.7 <4.4 8.0 >5.0 14 cm Ant Lat Mall 4.7  14.0 *30 >32  Left Sural Anti Sensory (Lat Mall)  29.7C  Calf    3.9 <4.0 *1.6 >5.0 Calf Lat Mall 3.9 14.0 36 >35  Right Sural Anti Sensory (Lat Mall)  29.8C  Calf    *4.1 <4.0 9.0 >5.0 Calf Lat Mall 4.1 14.0 *34 >35   Motor Summary Table   Stim Site NR Onset (ms) Norm Onset (ms) O-P Amp (mV) Norm O-P Amp Site1 Site2 Delta-0 (ms) Dist (cm) Vel (m/s) Norm Vel (m/s)  Left Dp Br Fibular Motor (AntTibialis)  30C  Fib Head    2.8 <4.2 2.0  Poplit Fib Head 1.6 8.0 50 >40.5  Poplit    4.4 <5.7 1.7         Left Fibular Motor (Ext Dig Brev)  29.6C    actually tibial  Ankle    6.0 <6.1 2.6 >2.5 B Fib Ankle 9.3 39.0 42 >38  B Fib    15.3  5.0         Right Fibular Motor (Ext Dig Brev)  29.4C  Ankle    4.4 <6.1 *1.2 >2.5 B Fib Ankle 6.5 31.0 48 >38  B Fib    10.9  0.7  Poplt B Fib 1.3 10.0 77 >40  Poplt    12.2  4.8         Right Tibial Motor (Abd Hall Brev)  29.5C  Ankle    5.4 <6.1 *1.0 >3.0 Knee Ankle 6.5 36.0 55 >35  Knee    11.9  7.7          EMG   Side Muscle Nerve Root Ins Act Fibs  Psw Amp Dur Poly Recrt Int Fraser Din Comment  Right AntTibialis Dp Br Peron L4-5 Nml Nml Nml Nml Nml 0 Nml Nml   Right Fibularis Longus  Sup Br Peron L5-S1 Nml Nml Nml Nml Nml 0 Nml Nml   Right MedGastroc Tibial S1-2 Nml Nml Nml Nml Nml 0 Nml Nml   Right VastusMed Femoral L2-4 Nml Nml Nml Nml Nml 0 Nml Nml   Right BicepsFemS Sciatic L5-S1 Nml Nml Nml Nml Nml 0 Nml Nml   Left AntTibialis Dp Br Peron L4-5 Nml Nml Nml Nml Nml 0 Nml Nml   Left Fibularis Longus  Sup Br Peron L5-S1 Nml Nml Nml Nml Nml 0 Nml Nml   Left MedGastroc Tibial S1-2 Nml Nml Nml Nml Nml 0 Nml Nml   Left VastusMed Femoral L2-4 Nml Nml Nml Nml Nml 0 Nml Nml   Left BicepsFemS Sciatic L5-S1 Nml Nml Nml Nml Nml 0 Nml Nml     Nerve Conduction Studies Anti Sensory Left/Right Comparison   Stim Site L Lat (ms) R Lat (ms) L-R Lat (ms) L Amp (V) R Amp (V) L-R Amp (%) Site1 Site2 L Vel (m/s) R Vel (m/s) L-R Vel (m/s)  Sup Fibular Anti Sensory  (Ant Lat Mall)  29.5C  14 cm  *4.7   8.0  14 cm Ant Lat Mall  30   Sural Anti Sensory (Lat Mall)  29.7C  Calf 3.9 *4.1 0.2 *1.6 9.0 82.2 Calf Lat Mall 36 *34 2   Motor Left/Right Comparison   Stim Site L Lat (ms) R Lat (ms) L-R Lat (ms) L Amp (mV) R Amp (mV) L-R Amp (%) Site1 Site2 L Vel (m/s) R Vel (m/s) L-R Vel (m/s)  Dp Br Fibular Motor (AntTibialis)  30C  Fib Head 2.8   2.0   Poplit Fib Head 50    Poplit 4.4   1.7         Fibular Motor (Ext Dig Brev)  29.6C    actually tibial  Ankle 6.0 4.4 1.6 2.6 *1.2 53.8 B Fib Ankle 42 48 6  B Fib 15.3 10.9 4.4 5.0 0.7 86.0       Tibial Motor (Abd Hall Brev)  29.5C  Ankle  5.4   *1.0  Knee Ankle  55   Knee  11.9   7.7           Waveforms:

## 2021-09-28 ENCOUNTER — Encounter (HOSPITAL_BASED_OUTPATIENT_CLINIC_OR_DEPARTMENT_OTHER): Payer: Medicaid Other | Admitting: Family Medicine

## 2021-09-28 DIAGNOSIS — U071 COVID-19: Secondary | ICD-10-CM | POA: Diagnosis not present

## 2021-09-28 MED ORDER — NIRMATRELVIR/RITONAVIR (PAXLOVID)TABLET: 3.0000 | ORAL_TABLET | Freq: Two times a day (BID) | ORAL | 0 refills | Status: AC

## 2021-09-28 NOTE — Telephone Encounter (Signed)

## 2021-09-29 ENCOUNTER — Ambulatory Visit: Payer: Medicaid Other | Admitting: Pulmonary Disease

## 2021-10-02 IMAGING — MR MR LUMBAR SPINE WO/W CM
4 of 7 series · 25 of 48 positions shown · IV contrast (multihance)
Comparison: 01/05/2017

CLINICAL DATA: Low back pain for over 6 weeks.

EXAM:
MRI LUMBAR SPINE WITHOUT AND WITH CONTRAST
TECHNIQUE: Multiplanar and multiecho pulse sequences of the lumbar spine were
obtained without and with intravenous contrast.
CONTRAST:  15mL MULTIHANCE GADOBENATE DIMEGLUMINE 529 MG/ML IV SOLN

[Series 2: T2 · sagittal · 4.0mm · 0.53mm/px · 5 of 15 slices shown (1 of 2)]
[im 1/15]
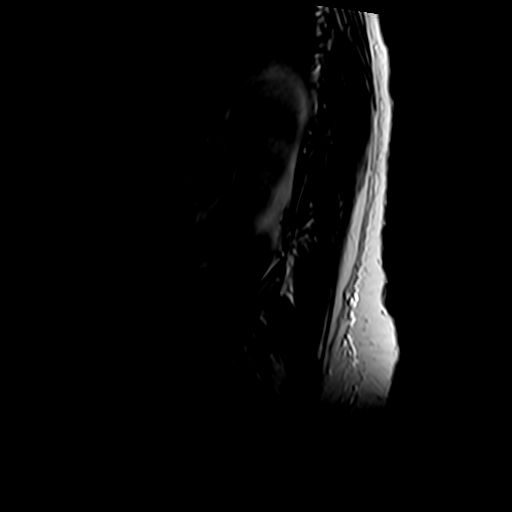
[im 4/15]
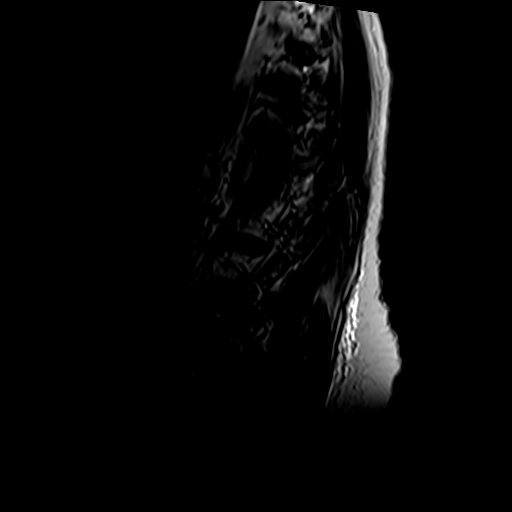
[im 8/15]
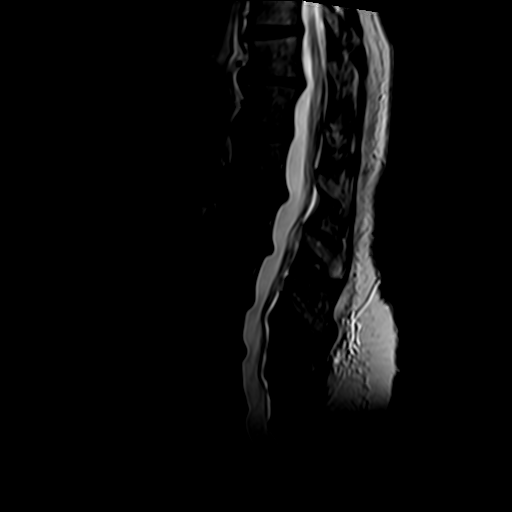
[im 11/15]
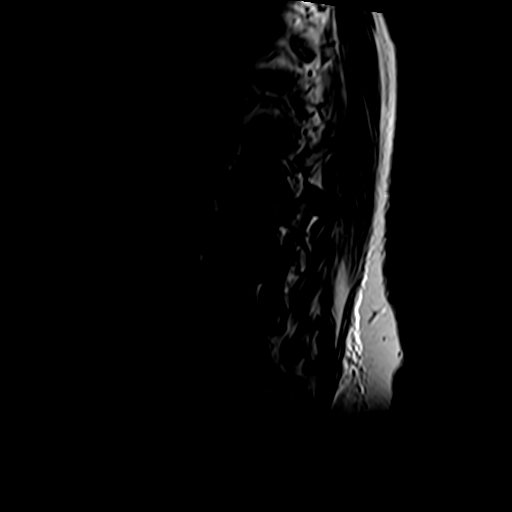
[im 15/15]
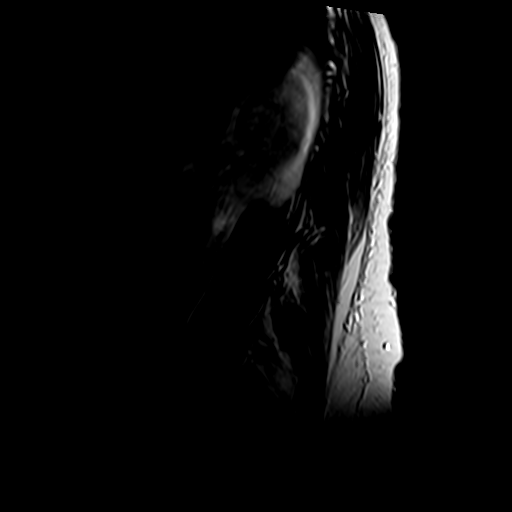

[Series 4: T1 · sagittal · 4.0mm · 0.53mm/px · 4 of 15 slices shown (1 of 2)]
[im 1/15]
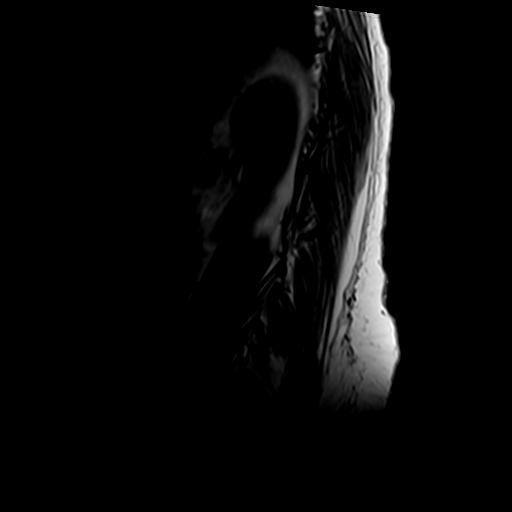
[im 5/15]
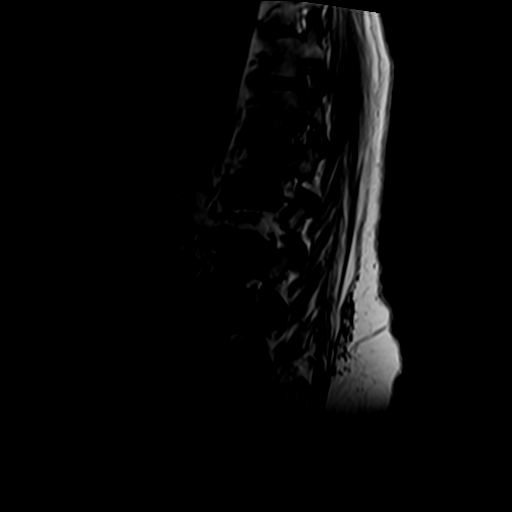
[im 10/15]
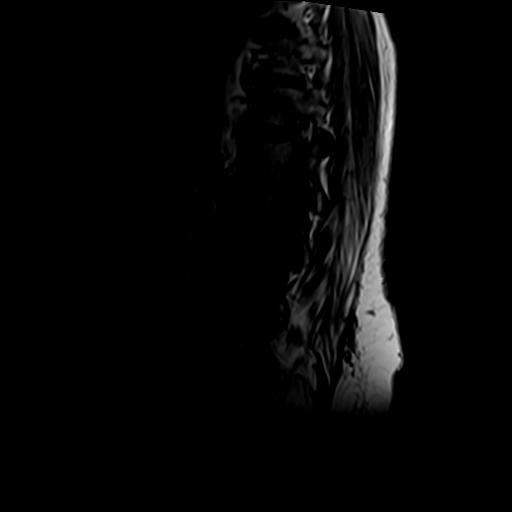
[im 15/15]
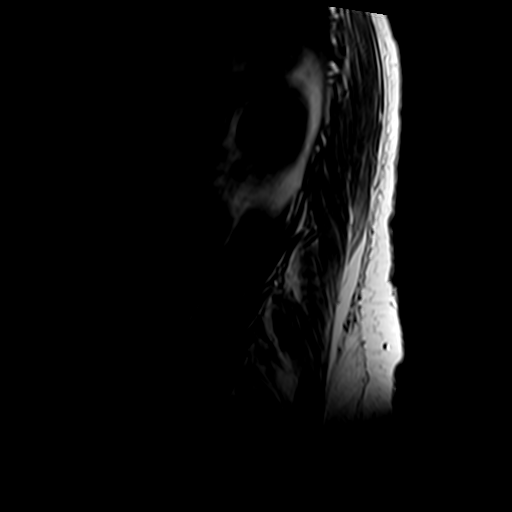

[Series 5: T2 · axial · 4.0mm · 0.70mm/px · z∈[-125,+59]mm · 8 of 35 slices shown (2 of 2)]
[im 1/35]
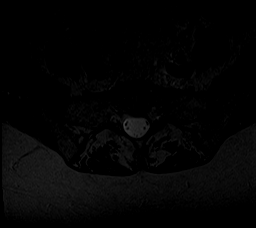
[im 4/35]
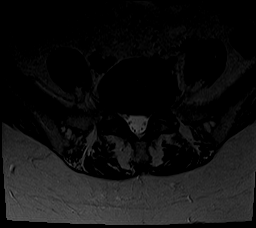
[im 12/35]
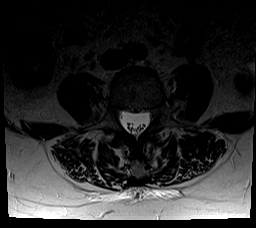
[im 16/35]
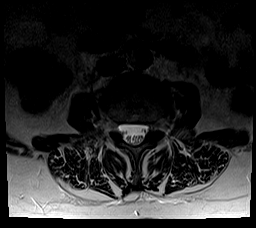
[im 19/35]
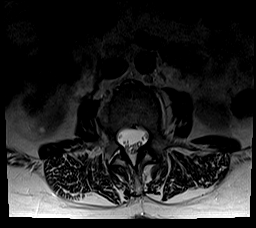
[im 23/35]
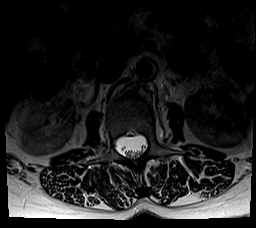
[im 31/35]
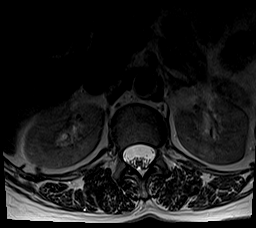
[im 35/35]
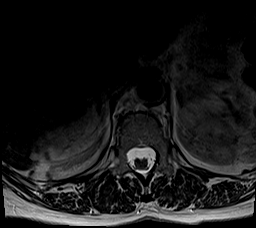

[Series 6: T1 · axial · 4.0mm · 0.35mm/px · z∈[-125,+59]mm · 8 of 35 slices shown (2 of 2)]
[im 1/35]
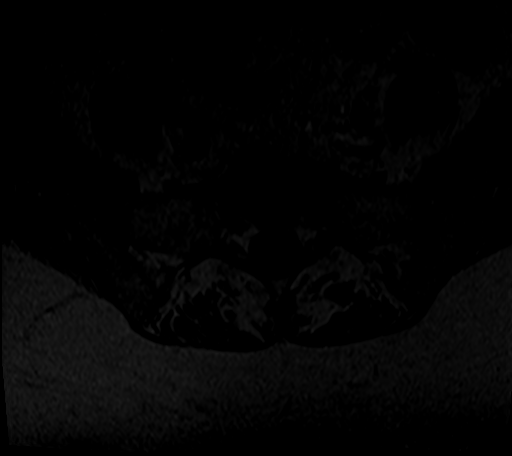
[im 4/35]
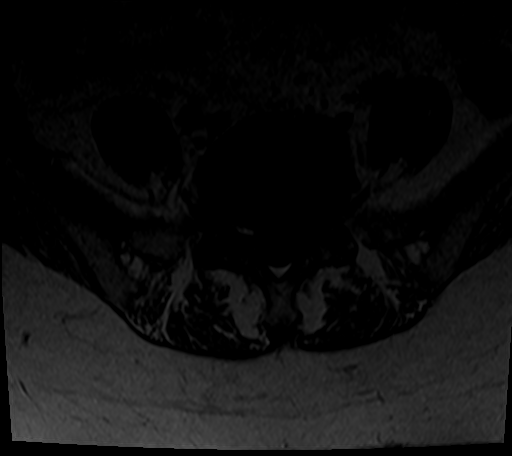
[im 12/35]
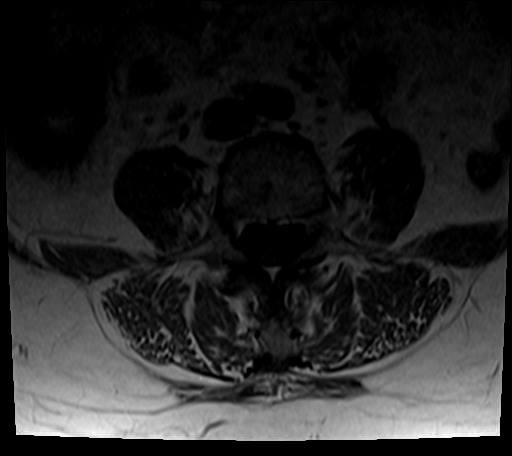
[im 16/35]
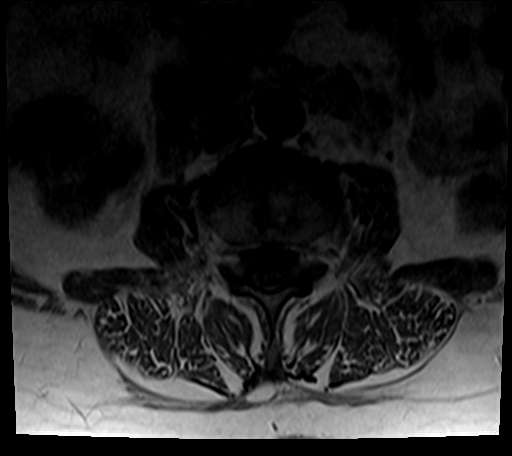
[im 19/35]
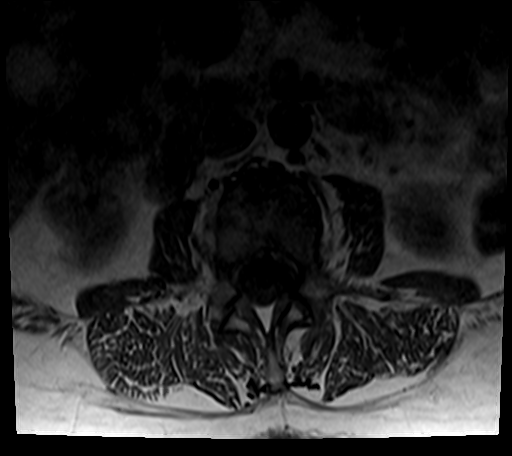
[im 23/35]
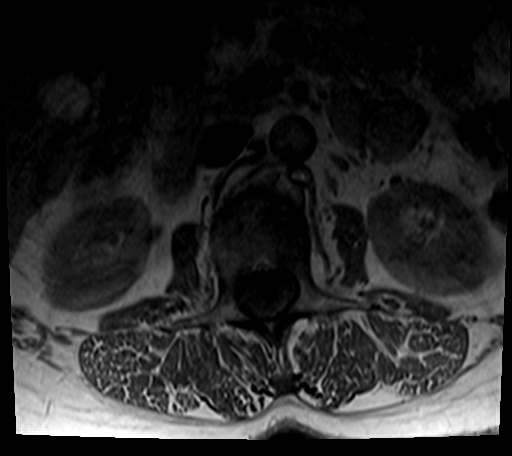
[im 31/35]
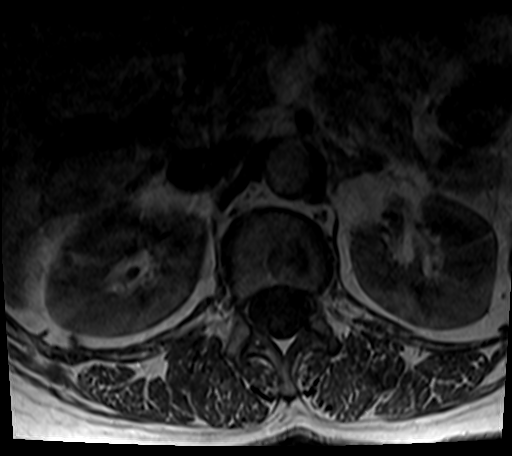
[im 35/35]
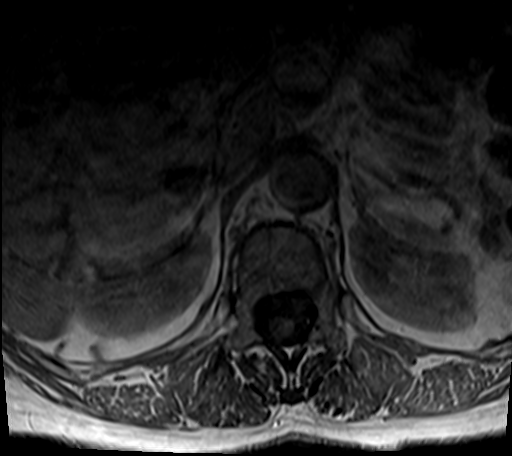

[25 of 48 positions shown; findings below may reference images not displayed]

FINDINGS: Segmentation:  5 lumbar type vertebrae

Alignment:  Mild scoliosis.  No listhesis

Vertebrae: No fracture, evidence of discitis, or bone lesion.
Endplate sclerosis has developed at L2-3 and L5-S1, degenerative
appearing.

Conus medullaris and cauda equina: Conus extends to the L1 level.
Conus and cauda equina appear normal.

Paraspinal and other soft tissues: No perispinal mass or
inflammation noted.

Disc levels:

T11-12: Chronic disc collapse with ventral protrusion.

T12- L1: Unremarkable.

L1-L2: Disc narrowing and bulging with endplate degeneration. Patent
canal and foramina

L2-L3: Disc narrowing and bulging with endplate degeneration.
Circumferential disc bulging and facet spurring. Mild or moderate
left foraminal narrowing

L3-L4: Disc narrowing and bulging with facet spurring. Patent canal
and foramina

L4-L5: Disc narrowing and bulging. Degenerative facet spurring
greater on the right. Mild-to-moderate right foraminal narrowing

L5-S1:Disc collapse and endplate degeneration with bulge.
Degenerative facet spurring on the right more than left. Biforaminal
impingement, worse on the right and progressed.
IMPRESSION: 1. Generalized lumbar spine degeneration with progression from 6077,
especially at L5-S1 where there is right more than left foraminal
impingement.
2. Diffusely patent spinal canal.
3. Mild scoliosis.

## 2021-10-04 ENCOUNTER — Other Ambulatory Visit: Payer: Self-pay | Admitting: Specialist

## 2021-10-05 ENCOUNTER — Ambulatory Visit (HOSPITAL_COMMUNITY)
Admission: EM | Admit: 2021-10-05 | Discharge: 2021-10-05 | Disposition: A | Discharge status: Home / Self Care | Payer: Medicaid Other | Attending: Family Medicine | Admitting: Family Medicine

## 2021-10-05 ENCOUNTER — Encounter (HOSPITAL_COMMUNITY): Payer: Self-pay

## 2021-10-05 DIAGNOSIS — R051 Acute cough: Secondary | ICD-10-CM

## 2021-10-05 DIAGNOSIS — U071 COVID-19: Secondary | ICD-10-CM | POA: Diagnosis not present

## 2021-10-05 DIAGNOSIS — R110 Nausea: Secondary | ICD-10-CM

## 2021-10-05 MED ORDER — BENZONATATE 200 MG PO CAPS: 200.0000 mg | ORAL_CAPSULE | Freq: Three times a day (TID) | ORAL | 0 refills | Status: DC | PRN

## 2021-10-05 NOTE — ED Provider Notes (Signed)
[place]    CSN: [id number] Arrival date & time: 10/05/21  1040      History   Chief Complaint Chief Complaint  Patient presents with   Covid Positive    HPI Cheryl King is a 56 y.o. female.   Patient is here for not feeling well.  She tested positive for covid about 10 days ago with cough, nausea.  She did email her pcp, and sent out paxlovid.  She finished that last night.   She still has cough, nausea, and light headedness.  Some chills, no fever.  Some mucous with the cough.  She is not taking any other medications for her symptoms at this time. The paxlovid did help with her symptoms overall.        Past Medical History:  Diagnosis Date   Acid reflux    Allergy    Carpal tunnel syndrome    COPD (chronic obstructive pulmonary disease) (HCC)    Depression    Diverticulitis    Hyperlipidemia    Low back pain    Neck pain    Sacroiliac inflammation ([place])    Sciatica    Seasonal allergies     Patient Active Problem List   Diagnosis Date Noted   Chronic respiratory failure with hypoxia ([place]) 03/11/2021   Centrilobular emphysema ([place]) 07/29/2020   Multiple nodules of lung 07/29/2020   Hyperlipidemia 07/18/2017   Ulnar neuropathy 04/18/2017   GERD (gastroesophageal reflux disease) 10/19/2016   Prediabetes 05/03/2016   Cubital tunnel syndrome on right 03/01/2016   Numbness and tingling of right arm 01/01/2016   Chlamydia infection 01/27/2015   Tobacco abuse 12/26/2013   Chronic lower back pain 04/13/2013   Sciatica 04/13/2013   Chronic neck pain 04/13/2013   Spondylosis of cervical spine 04/13/2013    Past Surgical History:  Procedure Laterality Date   ABDOMINAL SURGERY     ANKLE SURGERY     lt.   BREAST EXCISIONAL BIOPSY Right    pt states years ago- not sure when   PARTIAL HYSTERECTOMY     right breast cyst removed      URETHRAL DIVERTICULUM REPAIR     uretrral diverticultis    OB History     Gravida  1   Para       Term      Preterm      AB  1   Living         SAB  1   IAB      Ectopic      Multiple      Live Births               Home Medications    Prior to Admission medications   Medication Sig Start Date End Date Taking? Authorizing Provider  acetaminophen-codeine (TYLENOL #3) 300-30 MG tablet TAKE 1 TO 2 TABLETS BY MOUTH EVERY 6 HOURS AS NEEDED FOR PAIN 08/03/21   Jessy Oto, MD  albuterol (VENTOLIN HFA) 108 (90 Base) MCG/ACT inhaler Inhale 2 puffs into the lungs every 6 (six) hours as needed for wheezing or shortness of breath. 07/03/21   Rigoberto Noel, MD  amitriptyline (ELAVIL) 50 MG tablet TAKE 1 TABLET (50 MG TOTAL) BY MOUTH AT BEDTIME. 06/01/21 06/01/22  Charlott Rakes, MD  atorvastatin (LIPITOR) 20 MG tablet TAKE 1 TABLET (20 MG TOTAL) BY MOUTH DAILY. 06/01/21 06/01/22  Charlott Rakes, MD  Azelastine HCl 137 MCG/SPRAY SOLN Place into both nostrils. 01/04/21   [provider]  budesonide-formoterol (SYMBICORT) 160-4.5 MCG/ACT inhaler Inhale 2 puffs into the lungs 2 (two) times daily. in the morning and at bedtime. 07/03/21   Rigoberto Noel, MD  cetirizine (ZYRTEC) 10 MG tablet Take 1 tablet (10 mg total) by mouth daily. 04/18/17   Charlott Rakes, MD  cyclobenzaprine (FLEXERIL) 10 MG tablet TAKE 1 TABLET (10 MG TOTAL) BY MOUTH 2 (TWO) TIMES DAILY AS NEEDED FOR MUSCLE SPASMS. 06/01/21 06/01/22  Charlott Rakes, MD  diclofenac sodium (VOLTAREN) 1 % GEL Apply 2 g topically 4 (four) times daily. 03/18/17   Jessy Oto, MD  DULoxetine (CYMBALTA) 60 MG capsule TAKE 1 CAPSULE (60 MG TOTAL) BY MOUTH DAILY. 06/01/21 06/01/22  Charlott Rakes, MD  esomeprazole (NEXIUM) 40 MG capsule Take 40 mg by mouth 2 (two) times daily. 04/26/21   [provider]  EYSUVIS 0.25 % SUSP Apply 1 drop to eye 4 (four) times daily. 06/02/21   [provider]  ezetimibe (ZETIA) 10 MG tablet TAKE 1 TABLET (10 MG TOTAL) BY MOUTH DAILY AFTER SUPPER. 08/17/21 10/16/21  Adrian Prows, MD   fluconazole (DIFLUCAN) 150 MG tablet Take 1 tablet (150 mg total) by mouth once a week. 02/18/21   Carvel Getting, NP  fluticasone (FLONASE) 50 MCG/ACT nasal spray SPRAY 2 SPRAYS INTO EACH NOSTRIL EVERY DAY 06/15/21   Charlott Rakes, MD  Lactobacillus CAPS Take 1 capsule by mouth daily.    [provider]  lidocaine (LIDODERM) 5 % Place 1 patch onto the skin daily. Remove & Discard patch within 12 hours or as directed by MD 06/01/21   Charlott Rakes, MD  miconazole (MICATIN) 2 % cream Apply 1 application topically 2 (two) times daily. 02/18/21   Carvel Getting, NP  Misc. Devices MISC 1. Rolling walker with seat 2. Shower chair 3. Elevated toilet seat.   Diagnosis-chronic back pain 07/06/21   Charlott Rakes, MD  montelukast (SINGULAIR) 10 MG tablet Take 10 mg by mouth daily. 11/25/20   [provider]  Multiple Vitamin (MULTIVITAMIN WITH MINERALS) TABS tablet Take 1 tablet by mouth daily.    [provider]  nicotine (NICODERM CQ - DOSED IN MG/24 HOURS) 21 mg/24hr patch Place 1 patch (21 mg total) onto the skin daily. Then decrease to '14mg'$ /24 hr 06/01/21   Charlott Rakes, MD  oxybutynin (DITROPAN) 5 MG tablet TAKE 1 TABLET (5 MG TOTAL) BY MOUTH 2 (TWO) TIMES DAILY. 06/01/21 06/01/22  Charlott Rakes, MD  pregabalin (LYRICA) 75 MG capsule Take 1 capsule (75 mg total) by mouth 3 (three) times daily. 08/27/21 02/25/22  Jessy Oto, MD  RESTASIS 0.05 % ophthalmic emulsion  04/13/17   [provider]  Tiotropium Bromide Monohydrate (SPIRIVA RESPIMAT) 2.5 MCG/ACT AERS Inhale 2 puffs into the lungs daily. 07/03/21   Rigoberto Noel, MD  triamcinolone cream (KENALOG) 0.1 % Apply 1 application topically 2 (two) times daily. 01/03/19   Charlott Rakes, MD    Family History Family History  Problem Relation Age of Onset   Healthy Mother    Other Father        Unsure of medical history   Asthma Maternal Aunt    Diabetes Maternal Aunt    Cancer Maternal Aunt    Breast  cancer Maternal Aunt    Hypertension Maternal Grandmother    Heart Problems Maternal Grandmother    Hypertension Other    COPD Other    Colon cancer Neg Hx    Pancreatic cancer Neg Hx  Rectal cancer Neg Hx    Stomach cancer Neg Hx    Colon polyps Neg Hx    Esophageal cancer Neg Hx     Social History Social History   Tobacco Use   Smoking status: Every Day    Packs/day: 0.25    Years: 35.00    Total pack years: 8.75    Types: Cigarettes    Start date: 20   Smokeless tobacco: Never   Tobacco comments:    Wears patches, 3 cigarettes smoked on some days especially the days with stress.  Still trying to quit  Vaping Use   Vaping Use: Never used  Substance Use Topics   Alcohol use: Not Currently    Comment: Social only - seldom   Drug use: No     Allergies   Percocet [oxycodone-acetaminophen], Vicodin [hydrocodone-acetaminophen], and Penicillins   Review of Systems Review of Systems  Constitutional:  Negative for chills and fever.  HENT:  Positive for congestion and rhinorrhea.   Respiratory:  Positive for cough. Negative for shortness of breath and wheezing.   Cardiovascular: Negative.   Gastrointestinal: Negative.   Musculoskeletal: Negative.   Psychiatric/Behavioral: Negative.       Physical Exam Triage Vital Signs ED Triage Vitals  Enc Vitals Group     BP 10/05/21 1245 123/81     Pulse Rate 10/05/21 1245 60     Resp 10/05/21 1245 12     Temp 10/05/21 1245 98.8 F (37.1 C)     Temp Source 10/05/21 1245 Oral     SpO2 --      Weight 10/05/21 1242 163 lb (73.9 kg)     Height 10/05/21 1242 '5\' 5"'$  (1.651 m)     Head Circumference --      Peak Flow --      Pain Score 10/05/21 1242 6     Pain Loc --      Pain Edu? --      Excl. in [place]? --    No data found.  Updated Vital Signs BP 123/81 (BP Location: Left Arm)   Pulse 60   Temp 98.8 F (37.1 C) (Oral)   Resp 12   Ht '5\' 5"'$  (1.651 m)   Wt 73.9 kg   BMI 27.12 kg/m   Visual Acuity Right Eye  Distance:   Left Eye Distance:   Bilateral Distance:    Right Eye Near:   Left Eye Near:    Bilateral Near:     Physical Exam Constitutional:      Appearance: Normal appearance.  HENT:     Head: Normocephalic.     Nose: Nose normal.     Mouth/Throat:     Mouth: Mucous membranes are moist.  Cardiovascular:     Rate and Rhythm: Normal rate and regular rhythm.  Pulmonary:     Effort: Pulmonary effort is normal.     Breath sounds: Normal breath sounds.  Musculoskeletal:     Cervical back: Normal range of motion and neck supple. No tenderness.  Skin:    General: Skin is warm.  Neurological:     General: No focal deficit present.     Mental Status: She is alert.  Psychiatric:        Mood and Affect: Mood normal.      UC Treatments / Results  Labs (all labs ordered are listed, but only abnormal results are displayed) Labs Reviewed - No data to display  EKG   Radiology No results found.  Procedures  Procedures (including critical care time)  Medications Ordered in UC Medications - No data to display  Initial Impression / Assessment and Plan / UC Course  I have reviewed the triage vital signs and the nursing notes.  Pertinent labs & imaging results that were available during my care of the patient were reviewed by me and considered in my medical decision making (see chart for details).   Final Clinical Impressions(s) / UC Diagnoses   Final diagnoses:  COVID-19 virus infection  Acute cough  Nausea without vomiting     Discharge Instructions      You were seen today for continued symptoms after being diagnosed with covid 19.  Your exam overall was good today without evidence of secondary bacterial infection.  I have sent out a medication to help with cough.  You may use the anti-nausea medication you have at home if needed.  Continue to get plenty of rest and fluids.  As discussed you may get a rebound of symptoms after taking the paxlovid.  Please return  if you have new fever or difficulty breathing.     ED Prescriptions     Medication Sig Dispense Auth. Provider   benzonatate (TESSALON) 200 MG capsule Take 1 capsule (200 mg total) by mouth 3 (three) times daily as needed for cough. 21 capsule Rondel Oh, MD      PDMP not reviewed this encounter.   Rondel Oh, MD 10/05/21 1301

## 2021-10-05 NOTE — Discharge Instructions (Signed)
You were seen today for continued symptoms after being diagnosed with covid 19.  Your exam overall was good today without evidence of secondary bacterial infection.  I have sent out a medication to help with cough.  You may use the anti-nausea medication you have at home if needed.  Continue to get plenty of rest and fluids.  As discussed you may get a rebound of symptoms after taking the paxlovid.  Please return if you have new fever or difficulty breathing.

## 2021-10-05 NOTE — ED Triage Notes (Signed)
Pt tested positive for covid !wk ago . Pt continues to feel light headed , cough, nausea .

## 2021-10-09 ENCOUNTER — Ambulatory Visit: Payer: Medicaid Other | Admitting: Specialist

## 2021-10-09 ENCOUNTER — Encounter: Payer: Self-pay | Admitting: Specialist

## 2021-10-09 VITALS — BP 98/65 | HR 101 | Ht 65.0 in | Wt 163.0 lb

## 2021-10-09 DIAGNOSIS — M5416 Radiculopathy, lumbar region: Secondary | ICD-10-CM | POA: Diagnosis not present

## 2021-10-09 DIAGNOSIS — M4726 Other spondylosis with radiculopathy, lumbar region: Secondary | ICD-10-CM

## 2021-10-09 DIAGNOSIS — M5136 Other intervertebral disc degeneration, lumbar region: Secondary | ICD-10-CM | POA: Diagnosis not present

## 2021-10-09 DIAGNOSIS — M4722 Other spondylosis with radiculopathy, cervical region: Secondary | ICD-10-CM

## 2021-10-09 DIAGNOSIS — M48062 Spinal stenosis, lumbar region with neurogenic claudication: Secondary | ICD-10-CM

## 2021-10-09 NOTE — Patient Instructions (Signed)
Plan: Avoid frequent bending and stooping  No lifting greater than 10 lbs. May use ice or moist heat for pain. Weight loss is of benefit. Best medication for lumbar disc disease is arthritis medications like motrin, celebrex and naprosyn. Exercise is important to improve your indurance and does allow people to function better inspite of back pain.  Avoid bending, stooping and avoid lifting weights greater than 10 lbs. Avoid prolong standing and walking. Avoid frequent bending and stooping  No lifting greater than 10 lbs. May use ice or moist heat for pain. Weight loss is of benefit. Permanent disabled Handicap license is approved. Options include a spinal cord stimulator trial with permanent stimulator if it is of benefit. Pain Management for pain medication with intermittant injection treatment or Long segment thoracolumbar fusion surgery with cages, rods and screws.  I recommend that you consider these and we would be happy to assist with treating Your condition. Unfortunately each of these treatments have their own risks and none are likely to return you to having a normal spine or to make you not disabled.

## 2021-10-09 NOTE — Progress Notes (Signed)
Office Visit Note   Patient: Cheryl King           Date of Birth: Mar 28, 1965           MRN: [id number] Visit Date: 10/09/2021              Requested by: Cheryl Rakes, MD Cheryl King,  Cheryl King [id number] PCP: Cheryl Rakes, MD   Assessment & Plan: Visit Diagnoses:  1. Lumbar radiculopathy   2. Degenerative disc disease, lumbar   3. Other spondylosis with radiculopathy, lumbar region   4. Other spondylosis with radiculopathy, cervical region   5. Spinal stenosis of lumbar region with neurogenic claudication   6. Radiculopathy, lumbar region     Plan: Plan: Avoid frequent bending and stooping  No lifting greater than 10 lbs. May use ice or moist heat for pain. Weight loss is of benefit. Best medication for lumbar disc disease is arthritis medications like motrin, celebrex and naprosyn. Exercise is important to improve your indurance and does allow people to function better inspite of back pain.  Avoid bending, stooping and avoid lifting weights greater than 10 lbs. Avoid prolong standing and walking. Avoid frequent bending and stooping  No lifting greater than 10 lbs. May use ice or moist heat for pain. Weight loss is of benefit. Permanent disabled Handicap license is approved. Options include a spinal cord stimulator trial with permanent stimulator if it is of benefit. Pain Management for pain medication with intermittant injection treatment or Long segment thoracolumbar fusion surgery with cages, rods and screws.  I recommend that you consider these and we would be happy to assist with treating Your condition. Unfortunately each of these treatments have their own risks and none are likely to return you to having a normal spine or to make you not disabled.   Follow-Up Instructions: Return in about 4 weeks (around 11/06/2021).   Orders:  No orders of the defined types were placed in this encounter.  No orders of the defined types were  placed in this encounter.     Procedures: No procedures performed   Clinical Data: No additional findings.   Subjective: Chief Complaint  Patient presents with   Lower Back - Follow-up    56 year old female with history of back pain and into the legs bilaterally all the way down to the feet right side more than left. 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 She is experiencing pain that comes and goes and it limits ability to work and to perform ADLs. Has trouble with washing dishes and laundry and has to take her time. No bowel or bladder difficulty. Somewhat difficulty to walk and uses a cane in the right hand for ambulation. Right leg with the foot turning inwards and with walking the right  Foot tends to turn inwards. She has a walker with a seat. Needs a shower chair and a toilet seat.     Review of Systems  Constitutional:  Positive for chills and fatigue. Negative for activity change, appetite change, diaphoresis, fever and unexpected weight change.  HENT: Negative.    Eyes:  Positive for photophobia.  Respiratory: Negative.    Cardiovascular: Negative.   Gastrointestinal: Negative.   Endocrine: Negative.   Genitourinary: Negative.   Musculoskeletal: Negative.   Skin: Negative.   Allergic/Immunologic: Negative.   Neurological: Negative.   Hematological: Negative.   Psychiatric/Behavioral: Negative.       Objective: Vital Signs: BP 98/65 (BP  Location: Left Arm, Patient Position: Sitting)   Pulse (!) 101   Ht '5\' 5"'$  (1.651 m)   Wt 163 lb (73.9 kg)   BMI 27.12 kg/m   Physical Exam Constitutional:      Appearance: She is well-developed.  HENT:     Head: Normocephalic and atraumatic.  Eyes:     Pupils: Pupils are equal, round, and reactive to light.  Pulmonary:     Effort: Pulmonary effort is normal.     Breath sounds: Normal breath sounds.  Abdominal:     General: Bowel sounds are normal.     Palpations: Abdomen is soft.   Musculoskeletal:        General: Normal range of motion.     Cervical back: Normal range of motion and neck supple.  Skin:    General: Skin is warm and dry.  Neurological:     Mental Status: She is alert and oriented to person, place, and time.  Psychiatric:        Behavior: Behavior normal.        Thought Content: Thought content normal.        Judgment: Judgment normal.     Ortho Exam  Specialty Comments:  MRI LUMBAR SPINE WITHOUT AND WITH CONTRAST    COMPARISON:  01/05/2017   FINDINGS: Segmentation:  5 lumbar type vertebrae   Alignment:  Mild scoliosis.  No listhesis   Vertebrae: No fracture, evidence of discitis, or bone lesion. Endplate sclerosis has developed at L2-3 and L5-S1, degenerative appearing.   Conus medullaris and cauda equina: Conus extends to the L1 level. Conus and cauda equina appear normal.   Paraspinal and other soft tissues: No perispinal mass or inflammation noted.   Disc levels:   T11-12: Chronic disc collapse with ventral protrusion.   T12- L1: Unremarkable.   L1-L2: Disc narrowing and bulging with endplate degeneration. Patent canal and foramina   L2-L3: Disc narrowing and bulging with endplate degeneration. Circumferential disc bulging and facet spurring. Mild or moderate left foraminal narrowing   L3-L4: Disc narrowing and bulging with facet spurring. Patent canal and foramina   L4-L5: Disc narrowing and bulging. Degenerative facet spurring greater on the right. Mild-to-moderate right foraminal narrowing   L5-S1:Disc collapse and endplate degeneration with bulge. Degenerative facet spurring on the right more than left. Biforaminal impingement, worse on the right and progressed.   IMPRESSION: 1. Generalized lumbar spine degeneration with progression from 2018, especially at L5-S1 where there is right more than left foraminal impingement. 2. Diffusely patent spinal canal. 3. Mild scoliosis.     Electronically Signed    By: Jorje Guild M.D.   On: 09/30/2020 09:57 ---- 02/17/2017 EMG/NCS Impression: Essentially NORMAL electrodiagnostic study of the left upper limb.  There is no significant electrodiagnostic evidence of nerve entrapment, brachial plexopathy or cervical radiculopathy.     As you know, purely sensory or demyelinating radiculopathies and chemical radiculitis may not be detected with this particular electrodiagnostic study.   Recommendations: 1.  Follow-up with referring physician. 2.  Continue current management of symptoms.  ------  01/01/2016 EMG/NCS Impression: Essentially NORMAL electrodiagnostic study of both upper limbs.  There was once again very very mild distal slowing at the ulnar nerve at the wrist.This represents no essential change from the prior study in 2016   There is no significant electrodiagnostic evidence of nerve entrapment, brachial plexopathy, cervical radiculopathy or generalized peripheral neuropathy.  As you know, purely sensory or demyelinating radiculopathies and chemical radiculitis may not be detected  with this particular electrodiagnostic study.   Recommendations: 1.  Follow-up with referring physician. 2.  Continue current management of symptoms.  Imaging: No results found.   PMFS History: Patient Active Problem List   Diagnosis Date Noted   Chronic respiratory failure with hypoxia ([place]) 03/11/2021   Centrilobular emphysema ([place]) 07/29/2020   Multiple nodules of lung 07/29/2020   Hyperlipidemia 07/18/2017   Ulnar neuropathy 04/18/2017   GERD (gastroesophageal reflux disease) 10/19/2016   Prediabetes 05/03/2016   Cubital tunnel syndrome on right 03/01/2016   Numbness and tingling of right arm 01/01/2016   Chlamydia infection 01/27/2015   Tobacco abuse 12/26/2013   Chronic lower back pain 04/13/2013   Sciatica 04/13/2013   Chronic neck pain 04/13/2013   Spondylosis of cervical spine 04/13/2013   Past Medical History:  Diagnosis Date   Acid  reflux    Allergy    Carpal tunnel syndrome    COPD (chronic obstructive pulmonary disease) (HCC)    Depression    Diverticulitis    Hyperlipidemia    Low back pain    Neck pain    Sacroiliac inflammation ([place])    Sciatica    Seasonal allergies     Family History  Problem Relation Age of Onset   Healthy Mother    Other Father        Unsure of medical history   Asthma Maternal Aunt    Diabetes Maternal Aunt    Cancer Maternal Aunt    Breast cancer Maternal Aunt    Hypertension Maternal Grandmother    Heart Problems Maternal Grandmother    Hypertension Other    COPD Other    Colon cancer Neg Hx    Pancreatic cancer Neg Hx    Rectal cancer Neg Hx    Stomach cancer Neg Hx    Colon polyps Neg Hx    Esophageal cancer Neg Hx     Past Surgical History:  Procedure Laterality Date   ABDOMINAL SURGERY     ANKLE SURGERY     lt.   BREAST EXCISIONAL BIOPSY Right    pt states years ago- not sure when   PARTIAL HYSTERECTOMY     right breast cyst removed      URETHRAL DIVERTICULUM REPAIR     uretrral diverticultis   Social History   Occupational History   Occupation: Unemployed   Occupation: past Scientist, water quality, Radio broadcast assistant at [employer], office positions, Scientist, product/process development.  Tobacco Use   Smoking status: Every Day    Packs/day: 0.25    Years: 35.00    Total pack years: 8.75    Types: Cigarettes    Start date: 26   Smokeless tobacco: Never   Tobacco comments:    Wears patches, 3 cigarettes smoked on some days especially the days with stress.  Still trying to quit  Vaping Use   Vaping Use: Never used  Substance and Sexual Activity   Alcohol use: Not Currently    Comment: Social only - seldom   Drug use: No   Sexual activity: Never

## 2021-10-22 ENCOUNTER — Encounter: Payer: Self-pay | Admitting: Specialist

## 2021-10-22 ENCOUNTER — Ambulatory Visit: Payer: Medicaid Other | Admitting: Specialist

## 2021-11-06 ENCOUNTER — Encounter: Payer: Self-pay | Admitting: Specialist

## 2021-11-06 ENCOUNTER — Ambulatory Visit (INDEPENDENT_AMBULATORY_CARE_PROVIDER_SITE_OTHER): Payer: Medicaid Other | Admitting: Specialist

## 2021-11-06 VITALS — BP 115/67 | HR 80 | Ht 65.0 in | Wt 163.0 lb

## 2021-11-06 DIAGNOSIS — M5441 Lumbago with sciatica, right side: Secondary | ICD-10-CM

## 2021-11-06 DIAGNOSIS — M5136 Other intervertebral disc degeneration, lumbar region: Secondary | ICD-10-CM

## 2021-11-06 DIAGNOSIS — M5442 Lumbago with sciatica, left side: Secondary | ICD-10-CM

## 2021-11-06 DIAGNOSIS — M4726 Other spondylosis with radiculopathy, lumbar region: Secondary | ICD-10-CM

## 2021-11-06 DIAGNOSIS — M5416 Radiculopathy, lumbar region: Secondary | ICD-10-CM | POA: Diagnosis not present

## 2021-11-06 DIAGNOSIS — M48062 Spinal stenosis, lumbar region with neurogenic claudication: Secondary | ICD-10-CM

## 2021-11-06 DIAGNOSIS — G8929 Other chronic pain: Secondary | ICD-10-CM

## 2021-11-06 MED ORDER — PREGABALIN 75 MG PO CAPS: 75.0000 mg | ORAL_CAPSULE | Freq: Three times a day (TID) | ORAL | 5 refills | Status: DC

## 2021-11-06 MED ORDER — ACETAMINOPHEN-CODEINE 300-30 MG PO TABS: 1.0000 | ORAL_TABLET | Freq: Four times a day (QID) | ORAL | 0 refills | Status: DC | PRN

## 2021-11-06 NOTE — Progress Notes (Signed)
Office Visit Note   Patient: Cheryl King           Date of Birth: 04-10-1965           MRN: [id number] Visit Date: 11/06/2021              Requested by: Charlott Rakes, MD [place],  [place] [id number] PCP: Charlott Rakes, MD   Assessment & Plan: Visit Diagnoses:  1. Lumbar radiculopathy   2. Chronic midline low back pain with bilateral sciatica   3. Degenerative disc disease, lumbar   4. Other spondylosis with radiculopathy, lumbar region   5. Spinal stenosis of lumbar region with neurogenic claudication     Plan: Avoid frequent bending and stooping  No lifting greater than 10 lbs. May use ice or moist heat for pain. Weight loss is of benefit. Best medication for lumbar disc disease is arthritis medications like motrin, celebrex and naprosyn. Exercise is important to improve your indurance and does allow people to function better inspite of back pain.  Avoid bending, stooping and avoid lifting weights greater than 10 lbs. Avoid prolong standing and walking. Avoid frequent bending and stooping  No lifting greater than 10 lbs. May use ice or moist heat for pain. Weight loss is of benefit. Permanent disabled Handicap license is approved. Options include a spinal cord stimulator trial with permanent stimulator if it is of benefit. Pain Management for pain medication with intermittant injection treatment or Long segment thoracolumbar fusion surgery with cages, rods and screws.  I recommend that you consider these and we would be happy to assist with treating Your condition. Unfortunately each of these treatments have their own risks and none are likely to return you to having a normal spine or to make you not disabled.     Follow-Up Instructions: No follow-ups on file.   Orders:  No orders of the defined types were placed in this encounter.  No orders of the defined types were placed in this encounter.     Procedures: No procedures  performed   Clinical Data: No additional findings.   Subjective: Chief Complaint  Patient presents with   Lower Back - Pain    56 year old female with history low back more than neck pain. She describes her pain as 10 of 10. Stopped working Freight forwarder work in 2012. Pain in the back and both legs. Difficulty with Prolong standing and walking. She is able to grocery shop. Pain in the back with bending and stooping. Studies indicate progressive degenerative disc changes and spondylosis. These are  Painful with most positions and she has difficulty sleeping. She is here and has looked at a spinal cord stimulator as worth being evaluated.     Review of Systems  Constitutional: Negative.   HENT: Negative.    Eyes: Negative.   Respiratory: Negative.    Cardiovascular: Negative.   Gastrointestinal: Negative.   Endocrine: Negative.   Genitourinary: Negative.   Musculoskeletal: Negative.   Skin: Negative.   Allergic/Immunologic: Negative.   Neurological: Negative.   Hematological: Negative.   Psychiatric/Behavioral: Negative.       Objective: Vital Signs: BP 115/67 (BP Location: Left Arm, Patient Position: Sitting, Cuff Size: Normal)   Pulse 80   Ht '5\' 5"'$  (1.651 m)   Wt 163 lb (73.9 kg)   BMI 27.12 kg/m   Physical Exam Constitutional:      Appearance: She is well-developed.  HENT:     Head: Normocephalic and atraumatic.  Eyes:     Pupils: Pupils are equal, round, and reactive to light.  Pulmonary:     Effort: Pulmonary effort is normal.     Breath sounds: Normal breath sounds.  Abdominal:     General: Bowel sounds are normal.     Palpations: Abdomen is soft.  Musculoskeletal:     Cervical back: Normal range of motion and neck supple.     Lumbar back: Negative right straight leg raise test and negative left straight leg raise test.  Skin:    General: Skin is warm and dry.  Neurological:     Mental Status: She is alert and oriented to person, place, and time.   Psychiatric:        Behavior: Behavior normal.        Thought Content: Thought content normal.        Judgment: Judgment normal.     Back Exam   Tenderness  The patient is experiencing tenderness in the lumbar.  Range of Motion  Extension:  abnormal  Flexion:  abnormal  Lateral bend right:  abnormal  Lateral bend left:  abnormal  Rotation right:  abnormal  Rotation left:  abnormal   Muscle Strength  Right Quadriceps:  5/5  Left Quadriceps:  5/5  Right Hamstrings:  5/5  Left Hamstrings:  5/5   Tests  Straight leg raise right: negative Straight leg raise left: negative  Reflexes  Patellar:  2/4 Achilles:  2/4  Other  Toe walk: normal Heel walk: normal      Specialty Comments:  MRI LUMBAR SPINE WITHOUT AND WITH CONTRAST    COMPARISON:  01/05/2017   FINDINGS: Segmentation:  5 lumbar type vertebrae   Alignment:  Mild scoliosis.  No listhesis   Vertebrae: No fracture, evidence of discitis, or bone lesion. Endplate sclerosis has developed at L2-3 and L5-S1, degenerative appearing.   Conus medullaris and cauda equina: Conus extends to the L1 level. Conus and cauda equina appear normal.   Paraspinal and other soft tissues: No perispinal mass or inflammation noted.   Disc levels:   T11-12: Chronic disc collapse with ventral protrusion.   T12- L1: Unremarkable.   L1-L2: Disc narrowing and bulging with endplate degeneration. Patent canal and foramina   L2-L3: Disc narrowing and bulging with endplate degeneration. Circumferential disc bulging and facet spurring. Mild or moderate left foraminal narrowing   L3-L4: Disc narrowing and bulging with facet spurring. Patent canal and foramina   L4-L5: Disc narrowing and bulging. Degenerative facet spurring greater on the right. Mild-to-moderate right foraminal narrowing   L5-S1:Disc collapse and endplate degeneration with bulge. Degenerative facet spurring on the right more than left.  Biforaminal impingement, worse on the right and progressed.   IMPRESSION: 1. Generalized lumbar spine degeneration with progression from 2018, especially at L5-S1 where there is right more than left foraminal impingement. 2. Diffusely patent spinal canal. 3. Mild scoliosis.     Electronically Signed   By: Jorje Guild M.D.   On: 09/30/2020 09:57 ---- 02/17/2017 EMG/NCS Impression: Essentially NORMAL electrodiagnostic study of the left upper limb.  There is no significant electrodiagnostic evidence of nerve entrapment, brachial plexopathy or cervical radiculopathy.     As you know, purely sensory or demyelinating radiculopathies and chemical radiculitis may not be detected with this particular electrodiagnostic study.   Recommendations: 1.  Follow-up with referring physician. 2.  Continue current management of symptoms.  ------  01/01/2016 EMG/NCS Impression: Essentially NORMAL electrodiagnostic study of both upper limbs.  There was once  again very very mild distal slowing at the ulnar nerve at the wrist.This represents no essential change from the prior study in 2016   There is no significant electrodiagnostic evidence of nerve entrapment, brachial plexopathy, cervical radiculopathy or generalized peripheral neuropathy.  As you know, purely sensory or demyelinating radiculopathies and chemical radiculitis may not be detected with this particular electrodiagnostic study.   Recommendations: 1.  Follow-up with referring physician. 2.  Continue current management of symptoms.  Imaging: No results found.   PMFS History: Patient Active Problem List   Diagnosis Date Noted   Chronic respiratory failure with hypoxia ([place]) 03/11/2021   Centrilobular emphysema ([place]) 07/29/2020   Multiple nodules of lung 07/29/2020   Hyperlipidemia 07/18/2017   Ulnar neuropathy 04/18/2017   GERD (gastroesophageal reflux disease) 10/19/2016   Prediabetes 05/03/2016   Cubital tunnel syndrome on  right 03/01/2016   Numbness and tingling of right arm 01/01/2016   Chlamydia infection 01/27/2015   Tobacco abuse 12/26/2013   Chronic lower back pain 04/13/2013   Sciatica 04/13/2013   Chronic neck pain 04/13/2013   Spondylosis of cervical spine 04/13/2013   Past Medical History:  Diagnosis Date   Acid reflux    Allergy    Carpal tunnel syndrome    COPD (chronic obstructive pulmonary disease) (HCC)    Depression    Diverticulitis    Hyperlipidemia    Low back pain    Neck pain    Sacroiliac inflammation ([place])    Sciatica    Seasonal allergies     Family History  Problem Relation Age of Onset   Healthy Mother    Other Father        Unsure of medical history   Asthma Maternal Aunt    Diabetes Maternal Aunt    Cancer Maternal Aunt    Breast cancer Maternal Aunt    Hypertension Maternal Grandmother    Heart Problems Maternal Grandmother    Hypertension Other    COPD Other    Colon cancer Neg Hx    Pancreatic cancer Neg Hx    Rectal cancer Neg Hx    Stomach cancer Neg Hx    Colon polyps Neg Hx    Esophageal cancer Neg Hx     Past Surgical History:  Procedure Laterality Date   ABDOMINAL SURGERY     ANKLE SURGERY     lt.   BREAST EXCISIONAL BIOPSY Right    pt states years ago- not sure when   PARTIAL HYSTERECTOMY     right breast cyst removed      URETHRAL DIVERTICULUM REPAIR     uretrral diverticultis   Social History   Occupational History   Occupation: Unemployed   Occupation: past Scientist, water quality, Radio broadcast assistant at [employer], office positions, Scientist, product/process development.  Tobacco Use   Smoking status: Every Day    Packs/day: 0.25    Years: 35.00    Total pack years: 8.75    Types: Cigarettes    Start date: 65   Smokeless tobacco: Never   Tobacco comments:    Wears patches, 3 cigarettes smoked on some days especially the days with stress.  Still trying to quit  Vaping Use   Vaping Use: Never used  Substance and Sexual Activity   Alcohol use: Not  Currently    Comment: Social only - seldom   Drug use: No   Sexual activity: Never

## 2021-11-06 NOTE — Patient Instructions (Addendum)
Plan: Avoid frequent bending and stooping  No lifting greater than 10 lbs. May use ice or moist heat for pain. Weight loss is of benefit. Best medication for lumbar disc disease is arthritis medications like motrin, celebrex and naprosyn. Exercise is important to improve your indurance and does allow people to function better inspite of back pain.  Avoid bending, stooping and avoid lifting weights greater than 10 lbs. Avoid prolong standing and walking. Avoid frequent bending and stooping  No lifting greater than 10 lbs. May use ice or moist heat for pain. Weight loss is of benefit. Permanent disabled Handicap license is approved. Options include a spinal cord stimulator trial with permanent stimulator if it is of benefit. Pain Management for pain medication with intermittant injection treatment or Long segment thoracolumbar fusion surgery with cages, rods and screws. I have requested Dr. Ernestina Patches to evaluate you for this and the office will call and arrange an appt for this.  I recommend that you consider these and we would be happy to assist with treating Your condition. Unfortunately each of these treatments have their own risks and none are likely to return you to having a normal spine or to make you not disabled.

## 2021-11-10 ENCOUNTER — Other Ambulatory Visit: Payer: Self-pay | Admitting: Internal Medicine

## 2021-11-10 ENCOUNTER — Encounter: Payer: Self-pay | Admitting: Family Medicine

## 2021-11-10 MED ORDER — VARENICLINE TARTRATE (STARTER) 0.5 MG X 11 & 1 MG X 42 PO TBPK: ORAL_TABLET | ORAL | 0 refills | Status: DC

## 2021-11-11 ENCOUNTER — Ambulatory Visit (INDEPENDENT_AMBULATORY_CARE_PROVIDER_SITE_OTHER): Payer: Medicaid Other | Admitting: Physical Medicine and Rehabilitation

## 2021-11-11 ENCOUNTER — Encounter: Payer: Self-pay | Admitting: Physical Medicine and Rehabilitation

## 2021-11-11 VITALS — BP 120/77 | HR 80

## 2021-11-11 DIAGNOSIS — M5416 Radiculopathy, lumbar region: Secondary | ICD-10-CM

## 2021-11-11 DIAGNOSIS — G894 Chronic pain syndrome: Secondary | ICD-10-CM | POA: Diagnosis not present

## 2021-11-11 DIAGNOSIS — M48061 Spinal stenosis, lumbar region without neurogenic claudication: Secondary | ICD-10-CM | POA: Diagnosis not present

## 2021-11-11 NOTE — Progress Notes (Signed)
Cheryl King - 56 y.o. female MRN [id number]  Date of birth: 11/05/1965  Office Visit Note: Visit Date: 11/11/2021 PCP: Charlott Rakes, MD Referred by: Charlott Rakes, MD  Subjective: Chief Complaint  Patient presents with   Lower Back - Pain   HPI: Cheryl King is a 56 y.o. female who comes in today for evaluation of chronic, worsening and severe bilateral lower back pain radiating to buttocks and down legs to feet. She is long time patient of Dr. Basil Dess. He referred her to [place] to discuss possible spinal cord stimulator trial. Chronic back issues ongoing for many years and is exacerbated by movement and activity. She describes pain as sore, aching and tingling in nature, currently rates as 8 out of 10. Some relief of pain with home exercise regimen, rest and use of medications. Patient continues to take Duloxetine, Amitriptyline and Lyrica. Intermittent use of Tylenol #3 prescribed by Dr. Basil Dess. Patient has attended formal physical therapy at [facility], no relief of pain with these treatments. Lumbar MRI imaging from 2022 exhibits mild scoliosis, multi level degenerative facet changes and foraminal stenosis. No high grade spinal canal stenosis noted.  Patient has undergone multiple lumbar epidural steroid injections in our office over the years with minimal relief of pain. Patient was recently evaluated by Dr. Louanne Skye, per his notes patient would be a candidate for lumbar fusion, he also discussed long term chronic pain management. Patient does not wish to proceed with surgical intervention at this time, she is not interested in pain management. Patent states she has suffered with this pain for many years and feel nothing seems to alleviate her pain significantly. Patient denies focal weakness. Patient denies recent trauma or falls.    Review of Systems  Musculoskeletal:  Positive for back pain.  Neurological:  Positive for tingling. Negative for  sensory change, focal weakness and weakness.  All other systems reviewed and are negative.  Otherwise per HPI.  Assessment & Plan: Visit Diagnoses:    ICD-10-CM   1. Lumbar radiculopathy  M54.16 Ambulatory referral to Psychology    2. Foraminal stenosis of lumbar region  M48.061     3. Chronic pain syndrome  G89.4 Ambulatory referral to Psychology       Plan: Findings:  Recalcitrant bilateral lower back pain radiating to buttocks and down legs to feet. Patient continues to have severe pain despite good conservative therapies such as formal physical therapy, home exercise regimen, rest and use of medications. Minimal relief with previous lumbar epidural steroid injections. Patients clinical presentation and exam are complex, pain referral does not follow a specific dermatomal pattern. Next step is to proceed with spinal cord stimulator trial. I did speak with patient in detail regarding this procedure and process, I did inform her that she will need a neuropsychological evaluation prior to trial. I did provide her with educational material regarding spinal cord stimulator placement to take home and review. I will reach out to [facility] with [employer] today and will arrange for her to speak with patient to answer any questions that she may have. I will also place order for neuropsychological evaluation with Dr. Lora Paula. She could have the option to complete this over the telephone with another provider. Patient is agreeable with plan and has no further questions at this time. No red flag symptoms noted upon exam today.     Meds & Orders: No orders of the defined types were placed in this encounter.   Orders Placed  This Encounter  Procedures   Ambulatory referral to Psychology    Follow-up: Return if symptoms worsen or fail to improve.   Procedures: No procedures performed      Clinical History: EXAM: MRI LUMBAR SPINE WITHOUT AND WITH CONTRAST   TECHNIQUE: Multiplanar and  multiecho pulse sequences of the lumbar spine were obtained without and with intravenous contrast.   CONTRAST:  12m MULTIHANCE GADOBENATE DIMEGLUMINE 529 MG/ML IV SOLN   COMPARISON:  01/05/2017   FINDINGS: Segmentation:  5 lumbar type vertebrae   Alignment:  Mild scoliosis.  No listhesis   Vertebrae: No fracture, evidence of discitis, or bone lesion. Endplate sclerosis has developed at L2-3 and L5-S1, degenerative appearing.   Conus medullaris and cauda equina: Conus extends to the L1 level. Conus and cauda equina appear normal.   Paraspinal and other soft tissues: No perispinal mass or inflammation noted.   Disc levels:   T11-12: Chronic disc collapse with ventral protrusion.   T12- L1: Unremarkable.   L1-L2: Disc narrowing and bulging with endplate degeneration. Patent canal and foramina   L2-L3: Disc narrowing and bulging with endplate degeneration. Circumferential disc bulging and facet spurring. Mild or moderate left foraminal narrowing   L3-L4: Disc narrowing and bulging with facet spurring. Patent canal and foramina   L4-L5: Disc narrowing and bulging. Degenerative facet spurring greater on the right. Mild-to-moderate right foraminal narrowing   L5-S1:Disc collapse and endplate degeneration with bulge. Degenerative facet spurring on the right more than left. Biforaminal impingement, worse on the right and progressed.   IMPRESSION: 1. Generalized lumbar spine degeneration with progression from 2018, especially at L5-S1 where there is right more than left foraminal impingement. 2. Diffusely patent spinal canal. 3. Mild scoliosis.     Electronically Signed   By: JJorje GuildM.D.   On: 09/30/2020 09:57   She reports that she has been smoking cigarettes. She started smoking about 42 years ago. She has a 8.75 pack-year smoking history. She has never used smokeless tobacco.  Recent Labs    12/01/20 1108  HGBA1C 5.9    Objective:  VS:  HT:    WT:    BMI:     BP:120/77  HR:80bpm  TEMP: ( )  RESP:  Physical Exam Vitals and nursing note reviewed.  HENT:     Head: Normocephalic and atraumatic.     Right Ear: External ear normal.     Left Ear: External ear normal.     Nose: Nose normal.     Mouth/Throat:     Mouth: Mucous membranes are moist.  Eyes:     Extraocular Movements: Extraocular movements intact.  Cardiovascular:     Rate and Rhythm: Normal rate.     Pulses: Normal pulses.  Pulmonary:     Effort: Pulmonary effort is normal.  Abdominal:     General: Abdomen is flat. There is no distension.  Musculoskeletal:        General: Tenderness present.     Cervical back: Normal range of motion.     Comments: Pt rises from seated position to standing without difficulty. Good lumbar range of motion. Strong distal strength without clonus, no pain upon palpation of greater trochanters. Sensation intact bilaterally. Walks independently, gait steady.   Skin:    General: Skin is warm and dry.     Capillary Refill: Capillary refill takes less than 2 seconds.  Neurological:     General: No focal deficit present.     Mental Status: She is alert and  oriented to person, place, and time.  Psychiatric:        Mood and Affect: Mood normal.        Behavior: Behavior normal.     Ortho Exam  Imaging: No results found.  Past Medical/Family/Surgical/Social History: Medications & Allergies reviewed per EMR, new medications updated. Patient Active Problem List   Diagnosis Date Noted   Chronic respiratory failure with hypoxia ([place]) 03/11/2021   Centrilobular emphysema ([place]) 07/29/2020   Multiple nodules of lung 07/29/2020   Hyperlipidemia 07/18/2017   Ulnar neuropathy 04/18/2017   GERD (gastroesophageal reflux disease) 10/19/2016   Prediabetes 05/03/2016   Cubital tunnel syndrome on right 03/01/2016   Numbness and tingling of right arm 01/01/2016   Chlamydia infection 01/27/2015   Tobacco abuse 12/26/2013   Chronic lower back pain  04/13/2013   Sciatica 04/13/2013   Chronic neck pain 04/13/2013   Spondylosis of cervical spine 04/13/2013   Past Medical History:  Diagnosis Date   Acid reflux    Allergy    Carpal tunnel syndrome    COPD (chronic obstructive pulmonary disease) (HCC)    Depression    Diverticulitis    Hyperlipidemia    Low back pain    Neck pain    Sacroiliac inflammation (HCC)    Sciatica    Seasonal allergies    Family History  Problem Relation Age of Onset   Healthy Mother    Other Father        Unsure of medical history   Asthma Maternal Aunt    Diabetes Maternal Aunt    Cancer Maternal Aunt    Breast cancer Maternal Aunt    Hypertension Maternal Grandmother    Heart Problems Maternal Grandmother    Hypertension Other    COPD Other    Colon cancer Neg Hx    Pancreatic cancer Neg Hx    Rectal cancer Neg Hx    Stomach cancer Neg Hx    Colon polyps Neg Hx    Esophageal cancer Neg Hx    Past Surgical History:  Procedure Laterality Date   ABDOMINAL SURGERY     ANKLE SURGERY     lt.   BREAST EXCISIONAL BIOPSY Right    pt states years ago- not sure when   PARTIAL HYSTERECTOMY     right breast cyst removed      URETHRAL DIVERTICULUM REPAIR     uretrral diverticultis   Social History   Occupational History   Occupation: Unemployed   Occupation: past Scientist, water quality, Radio broadcast assistant at [employer], office positions, Scientist, product/process development.  Tobacco Use   Smoking status: Every Day    Packs/day: 0.25    Years: 35.00    Total pack years: 8.75    Types: Cigarettes    Start date: 72   Smokeless tobacco: Never   Tobacco comments:    Wears patches, 3 cigarettes smoked on some days especially the days with stress.  Still trying to quit  Vaping Use   Vaping Use: Never used  Substance and Sexual Activity   Alcohol use: Not Currently    Comment: Social only - seldom   Drug use: No   Sexual activity: Never

## 2021-11-11 NOTE — Progress Notes (Signed)
Numeric Pain Rating Scale and Functional Assessment Average Pain 10   In the last MONTH (on 0-10 scale) has pain interfered with the following?  1. General activity like being  able to carry out your everyday physical activities such as walking, climbing stairs, carrying groceries, or moving a chair?  Rating(10)   +Driver, -BT, -Dye Allergies.  Bending and moving around makes back pain worse. Pain goes across back and radiates down both legs. Tylenol #3 for pain

## 2021-11-17 ENCOUNTER — Encounter: Payer: Self-pay | Admitting: Specialist

## 2021-11-17 NOTE — Progress Notes (Signed)
Patient not seen.

## 2021-12-03 ENCOUNTER — Ambulatory Visit: Payer: Medicaid Other | Attending: Family Medicine | Admitting: Family Medicine

## 2021-12-03 ENCOUNTER — Encounter: Payer: Self-pay | Admitting: Family Medicine

## 2021-12-03 ENCOUNTER — Other Ambulatory Visit (HOSPITAL_COMMUNITY)
Admission: RE | Admit: 2021-12-03 | Discharge: 2021-12-03 | Disposition: A | Discharge status: Home / Self Care | Payer: Medicaid Other | Source: Ambulatory Visit | Attending: Family Medicine | Admitting: Family Medicine

## 2021-12-03 VITALS — BP 114/71 | HR 79 | Temp 97.8°F | Ht 65.0 in | Wt 167.4 lb

## 2021-12-03 DIAGNOSIS — N898 Other specified noninflammatory disorders of vagina: Secondary | ICD-10-CM

## 2021-12-03 DIAGNOSIS — Z1231 Encounter for screening mammogram for malignant neoplasm of breast: Secondary | ICD-10-CM

## 2021-12-03 DIAGNOSIS — E7800 Pure hypercholesterolemia, unspecified: Secondary | ICD-10-CM | POA: Diagnosis not present

## 2021-12-03 DIAGNOSIS — G44229 Chronic tension-type headache, not intractable: Secondary | ICD-10-CM | POA: Diagnosis not present

## 2021-12-03 DIAGNOSIS — R7303 Prediabetes: Secondary | ICD-10-CM | POA: Diagnosis not present

## 2021-12-03 DIAGNOSIS — F320 Major depressive disorder, single episode, mild: Secondary | ICD-10-CM

## 2021-12-03 DIAGNOSIS — M5441 Lumbago with sciatica, right side: Secondary | ICD-10-CM

## 2021-12-03 DIAGNOSIS — I700 Atherosclerosis of aorta: Secondary | ICD-10-CM

## 2021-12-03 DIAGNOSIS — N39498 Other specified urinary incontinence: Secondary | ICD-10-CM

## 2021-12-03 DIAGNOSIS — M5442 Lumbago with sciatica, left side: Secondary | ICD-10-CM

## 2021-12-03 LAB — POCT GLYCOSYLATED HEMOGLOBIN (HGB A1C): HbA1c, POC (controlled diabetic range): 5.9 % (ref 0.0–7.0)

## 2021-12-03 MED ORDER — CYCLOBENZAPRINE HCL 10 MG PO TABS: ORAL_TABLET | Freq: Two times a day (BID) | ORAL | 1 refills | Status: DC | PRN

## 2021-12-03 MED ORDER — ATORVASTATIN CALCIUM 20 MG PO TABS: ORAL_TABLET | Freq: Every day | ORAL | 1 refills | Status: DC

## 2021-12-03 MED ORDER — DULOXETINE HCL 60 MG PO CPEP
ORAL_CAPSULE | Freq: Every day | ORAL | 1 refills | Status: DC
Start: 2021-12-03 — End: 2022-06-03

## 2021-12-03 MED ORDER — OXYBUTYNIN CHLORIDE 5 MG PO TABS: ORAL_TABLET | Freq: Two times a day (BID) | ORAL | 1 refills | Status: DC

## 2021-12-03 MED ORDER — AMITRIPTYLINE HCL 50 MG PO TABS: ORAL_TABLET | Freq: Every day | ORAL | 1 refills | Status: DC

## 2021-12-03 NOTE — Progress Notes (Signed)
Subjective:  Patient ID: Cheryl King, female    DOB: 07/11/1965  Age: 56 y.o. MRN: [id number]  CC: Vaginal Discharge   HPI Cheryl King is a 56 y.o. year old female with a history of Prediabetes (diet controlled A1c 5.9) chronic low back pain with sciatica, GERD, hyperlipidemia,  Emphysema (uses oxygen at night) tobacco abuse here for a follow-up visit.    Interval History: Had a visit with orthopedic for management of her lumbar radiculopathy 3 weeks ago and per notes she does not have any interest in proceeding with surgical management.  Plan is to proceed with spinal cord stimulator trial. He continues to have low back pain which radiates down to both legs and only prednisone provides relief but that has caused her to gain weight.  She is yet to begin taking chantix for smoking cessation. Emphysema is stable with no flares.  Currently under the care of pulmonary.  She talks to her therapist. Quintella Baton 'it is rough right now'. [facility]. Therapy has been beneficial and she is also on Cymbalta.  She Complains of vaginal discharge with no odor. She has no urinary symptoms  Past Medical History:  Diagnosis Date   Acid reflux    Allergy    Carpal tunnel syndrome    COPD (chronic obstructive pulmonary disease) (HCC)    Depression    Diverticulitis    Hyperlipidemia    Low back pain    Neck pain    Sacroiliac inflammation (HCC)    Sciatica    Seasonal allergies     Past Surgical History:  Procedure Laterality Date   ABDOMINAL SURGERY     ANKLE SURGERY     lt.   BREAST EXCISIONAL BIOPSY Right    pt states years ago- not sure when   PARTIAL HYSTERECTOMY     right breast cyst removed      URETHRAL DIVERTICULUM REPAIR     uretrral diverticultis    Family History  Problem Relation Age of Onset   Healthy Mother    Other Father        Unsure of medical history   Asthma Maternal Aunt    Diabetes Maternal Aunt    Cancer Maternal Aunt     Breast cancer Maternal Aunt    Hypertension Maternal Grandmother    Heart Problems Maternal Grandmother    Hypertension Other    COPD Other    Colon cancer Neg Hx    Pancreatic cancer Neg Hx    Rectal cancer Neg Hx    Stomach cancer Neg Hx    Colon polyps Neg Hx    Esophageal cancer Neg Hx     Social History   Socioeconomic History   Marital status: Married    Spouse name: [name]   Number of children: 0   Years of education: Bachelors   Highest education level: Associate degree: academic program  Occupational History   Occupation: Unemployed   Occupation: past Scientist, water quality, Radio broadcast assistant at [employer], office positions, Scientist, product/process development.  Tobacco Use   Smoking status: Every Day    Packs/day: 0.25    Years: 35.00    Total pack years: 8.75    Types: Cigarettes    Start date: 20   Smokeless tobacco: Never   Tobacco comments:    Wears patches, 3 cigarettes smoked on some days especially the days with stress.  Still trying to quit  Vaping Use   Vaping Use: Never used  Substance and Sexual  Activity   Alcohol use: Not Currently    Comment: Social only - seldom   Drug use: No   Sexual activity: Never  Other Topics Concern   Not on file  Social History Narrative   Lives at home alone.   Right-handed.   Occasional caffeine use.   Social Determinants of Health   Financial Resource Strain: Not on file  Food Insecurity: No Food Insecurity (10/22/2020)   Hunger Vital Sign    Worried About Running Out of Food in the Last Year: Never true    Ran Out of Food in the Last Year: Never true  Transportation Needs: Not on file  Physical Activity: Not on file  Stress: Not on file  Social Connections: Not on file    Allergies  Allergen Reactions   Percocet [Oxycodone-Acetaminophen] Nausea Only   Vicodin [Hydrocodone-Acetaminophen] Nausea Only   Penicillins Rash    Outpatient Medications Prior to Visit  Medication Sig Dispense Refill   acetaminophen-codeine  (TYLENOL #3) 300-30 MG tablet TAKE 1-2 TABLETS BY MOUTH EVERY 6 HOURS AS NEEDED FOR PAIN 40 tablet 0   albuterol (VENTOLIN HFA) 108 (90 Base) MCG/ACT inhaler Inhale 2 puffs into the lungs every 6 (six) hours as needed for wheezing or shortness of breath. 18 g 5   Azelastine HCl 137 MCG/SPRAY SOLN Place into both nostrils.     budesonide-formoterol (SYMBICORT) 160-4.5 MCG/ACT inhaler Inhale 2 puffs into the lungs 2 (two) times daily. in the morning and at bedtime. 10.2 each 5   cetirizine (ZYRTEC) 10 MG tablet Take 1 tablet (10 mg total) by mouth daily. 90 tablet 1   diclofenac sodium (VOLTAREN) 1 % GEL Apply 2 g topically 4 (four) times daily. 5 Tube 2   esomeprazole (NEXIUM) 40 MG capsule Take 40 mg by mouth 2 (two) times daily.     EYSUVIS 0.25 % SUSP Apply 1 drop to eye 4 (four) times daily.     fluconazole (DIFLUCAN) 150 MG tablet Take 1 tablet (150 mg total) by mouth once a week. 2 tablet 0   fluticasone (FLONASE) 50 MCG/ACT nasal spray SPRAY 2 SPRAYS INTO EACH NOSTRIL EVERY DAY 48 mL 0   Lactobacillus CAPS Take 1 capsule by mouth daily.     lidocaine (LIDODERM) 5 % Place 1 patch onto the skin daily. Remove & Discard patch within 12 hours or as directed by MD 30 patch 3   miconazole (MICATIN) 2 % cream Apply 1 application topically 2 (two) times daily. 28.35 g 0   Misc. Devices MISC 1. Rolling walker with seat 2. Shower chair 3. Elevated toilet seat.   Diagnosis-chronic back pain 1 each 0   montelukast (SINGULAIR) 10 MG tablet Take 10 mg by mouth daily.     Multiple Vitamin (MULTIVITAMIN WITH MINERALS) TABS tablet Take 1 tablet by mouth daily.     pregabalin (LYRICA) 75 MG capsule Take 1 capsule (75 mg total) by mouth 3 (three) times daily. 90 capsule 5   RESTASIS 0.05 % ophthalmic emulsion   3   Tiotropium Bromide Monohydrate (SPIRIVA RESPIMAT) 2.5 MCG/ACT AERS Inhale 2 puffs into the lungs daily. 1 each 12   triamcinolone cream (KENALOG) 0.1 % Apply 1 application topically 2 (two) times  daily. 45 g 1   Varenicline Tartrate, Starter, (CHANTIX STARTING MONTH PAK) 0.5 MG X 11 & 1 MG X 42 TBPK UAD on package. 53 each 0   amitriptyline (ELAVIL) 50 MG tablet TAKE 1 TABLET (50 MG TOTAL) BY MOUTH AT BEDTIME.  90 tablet 1   atorvastatin (LIPITOR) 20 MG tablet TAKE 1 TABLET (20 MG TOTAL) BY MOUTH DAILY. 90 tablet 1   cyclobenzaprine (FLEXERIL) 10 MG tablet TAKE 1 TABLET (10 MG TOTAL) BY MOUTH 2 (TWO) TIMES DAILY AS NEEDED FOR MUSCLE SPASMS. 180 tablet 1   DULoxetine (CYMBALTA) 60 MG capsule TAKE 1 CAPSULE (60 MG TOTAL) BY MOUTH DAILY. 90 capsule 1   oxybutynin (DITROPAN) 5 MG tablet TAKE 1 TABLET (5 MG TOTAL) BY MOUTH 2 (TWO) TIMES DAILY. 180 tablet 1   acetaminophen-codeine (TYLENOL #3) 300-30 MG tablet Take 1 tablet by mouth every 6 (six) hours as needed for moderate pain. (Patient not taking: Reported on 12/03/2021) 40 tablet 0   ezetimibe (ZETIA) 10 MG tablet TAKE 1 TABLET (10 MG TOTAL) BY MOUTH DAILY AFTER SUPPER. 90 tablet 1   nicotine (NICODERM CQ - DOSED IN MG/24 HOURS) 21 mg/24hr patch Place 1 patch (21 mg total) onto the skin daily. Then decrease to 61m/24 hr (Patient not taking: Reported on 12/03/2021) 28 patch 2   Facility-Administered Medications Prior to Visit  Medication Dose Route Frequency Provider Last Rate Last Admin   0.9 %  sodium chloride infusion  500 mL Intravenous Once DNelida MeuseIII, MD         ROS Review of Systems  Constitutional:  Negative for activity change and appetite change.  HENT:  Negative for sinus pressure and sore throat.   Respiratory:  Negative for chest tightness, shortness of breath and wheezing.   Cardiovascular:  Negative for chest pain and palpitations.  Gastrointestinal:  Negative for abdominal distention, abdominal pain and constipation.  Genitourinary: Negative.   Musculoskeletal:        See HPI  Psychiatric/Behavioral:  Negative for behavioral problems and dysphoric mood.     Objective:  BP 114/71   Pulse 79   Temp 97.8 F  (36.6 C) (Oral)   Ht _0  (1.651 m)   Wt 167 lb 6.4 oz (75.9 kg)   SpO2 100%   BMI 27.86 kg/m      12/03/2021    9:19 AM 11/11/2021   10:15 AM 11/06/2021    9:08 AM  BP/Weight  Systolic BP [id number] Diastolic BP 71 77 67  Wt. (Lbs) 167.4  163  BMI 27.86 kg/m2  27.12 kg/m2      Physical Exam Constitutional:      Appearance: She is well-developed.  Cardiovascular:     Rate and Rhythm: Normal rate.     Heart sounds: Normal heart sounds. No murmur heard. Pulmonary:     Effort: Pulmonary effort is normal.     Breath sounds: Normal breath sounds. No wheezing or rales.  Chest:     Chest wall: No tenderness.  Abdominal:     General: Bowel sounds are normal. There is no distension.     Palpations: Abdomen is soft. There is no mass.     Tenderness: There is no abdominal tenderness.  Musculoskeletal:     Right lower leg: No edema.     Left lower leg: No edema.     Comments: Tenderness to palpation of lumbar spine.  Positive straight leg raise bilaterally  Neurological:     Mental Status: She is alert and oriented to person, place, and time.  Psychiatric:        Mood and Affect: Mood normal.        Latest Ref Rng & Units 06/01/2021    9:53 AM 01/28/2020  10:12 AM 07/04/2019   10:03 AM  CMP  Glucose 70 - 99 mg/dL 89  84  86   BUN 6 - 24 mg/dL _0 Creatinine 0.57 - 1.00 mg/dL 0.94  0.79  0.82   Sodium 134 - 144 mmol/L 142  138  139   Potassium 3.5 - 5.2 mmol/L 4.4  4.4  4.9   Chloride 96 - 106 mmol/L 105  102  101   CO2 20 - 29 mmol/L _1 Calcium 8.7 - 10.2 mg/dL 9.1  9.5  9.8   Total Protein 6.0 - 8.5 g/dL 6.6  6.9  7.3   Total Bilirubin 0.0 - 1.2 mg/dL 0.2  0.4  0.3   Alkaline Phos 44 - 121 IU/L 103  106  108   AST 0 - 40 IU/L _2 ALT 0 - 32 IU/L _3 Lipid Panel     Component Value Date/Time   CHOL 129 06/01/2021 0953   TRIG 57 06/01/2021 0953   HDL 63 06/01/2021 0953   CHOLHDL 3.2 01/28/2020 1012   CHOLHDL 3.2  12/26/2013 1319   VLDL 18 12/26/2013 1319   LDLCALC 54 06/01/2021 0953    CBC    Component Value Date/Time   WBC 7.9 06/01/2021 0953   WBC 9.3 04/13/2013 1152   RBC 4.16 06/01/2021 0953   RBC 4.94 04/13/2013 1152   HGB 12.2 06/01/2021 0953   HCT 38.2 06/01/2021 0953   PLT 246 06/01/2021 0953   MCV 92 06/01/2021 0953   MCH 29.3 06/01/2021 0953   MCH 32.2 04/13/2013 1152   MCHC 31.9 06/01/2021 0953   MCHC 34.6 04/13/2013 1152   RDW 15.6 (H) 06/01/2021 0953   LYMPHSABS 1.9 06/01/2021 0953   MONOABS 0.9 04/13/2013 1152   EOSABS 0.2 06/01/2021 0953   BASOSABS 0.0 06/01/2021 0953    Lab Results  Component Value Date   HGBA1C 5.9 12/03/2021    Assessment & Plan:  1. Chronic tension-type headache, not intractable Controlled - amitriptyline (ELAVIL) 50 MG tablet; TAKE 1 TABLET (50 MG TOTAL) BY MOUTH AT BEDTIME.  Dispense: 90 tablet; Refill: 1 - cyclobenzaprine (FLEXERIL) 10 MG tablet; TAKE 1 TABLET (10 MG TOTAL) BY MOUTH 2 (TWO) TIMES DAILY AS NEEDED FOR MUSCLE SPASMS.  Dispense: 180 tablet; Refill: 1 - DULoxetine (CYMBALTA) 60 MG capsule; TAKE 1 CAPSULE (60 MG TOTAL) BY MOUTH DAILY.  Dispense: 90 capsule; Refill: 1  2. Encounter for screening mammogram for malignant neoplasm of breast Mammo ordered  3. Pure hypercholesterolemia Controlled Low cholesterol diet - CMP14+EGFR - LP+Non-HDL Cholesterol - atorvastatin (LIPITOR) 20 MG tablet; TAKE 1 TABLET (20 MG TOTAL) BY MOUTH DAILY.  Dispense: 90 tablet; Refill: 1  4. Other urinary incontinence Stable - oxybutynin (DITROPAN) 5 MG tablet; TAKE 1 TABLET (5 MG TOTAL) BY MOUTH 2 (TWO) TIMES DAILY.  Dispense: 180 tablet; Refill: 1  5. Vaginal discharge - Cervicovaginal ancillary only  6. Prediabetes Labs reveal prediabetes with an A1c of 5.9.  Working on a low carbohydrate diet, exercise, weight loss is recommended in order to prevent progression to type 2 diabetes mellitus.  - POCT glycosylated hemoglobin (Hb A1C)  7.  Current mild episode of major depressive disorder without prior episode ([place]) Uncontrolled on Cymbalta States therapy has been beneficial Underlying chronic back pain is contributing  8. Aortic atherosclerosis (HCC) Currently on statin Counseled don risk  factor modification including smoking cessation She is not ready to quit yet  9. Bilateral low back pain Uncontrolled Declines surgical intervention She is being worked up for a sinal cord stimulator  Meds ordered this encounter  Medications   amitriptyline (ELAVIL) 50 MG tablet    Sig: TAKE 1 TABLET (50 MG TOTAL) BY MOUTH AT BEDTIME.    Dispense:  90 tablet    Refill:  1   atorvastatin (LIPITOR) 20 MG tablet    Sig: TAKE 1 TABLET (20 MG TOTAL) BY MOUTH DAILY.    Dispense:  90 tablet    Refill:  1   cyclobenzaprine (FLEXERIL) 10 MG tablet    Sig: TAKE 1 TABLET (10 MG TOTAL) BY MOUTH 2 (TWO) TIMES DAILY AS NEEDED FOR MUSCLE SPASMS.    Dispense:  180 tablet    Refill:  1   DULoxetine (CYMBALTA) 60 MG capsule    Sig: TAKE 1 CAPSULE (60 MG TOTAL) BY MOUTH DAILY.    Dispense:  90 capsule    Refill:  1   oxybutynin (DITROPAN) 5 MG tablet    Sig: TAKE 1 TABLET (5 MG TOTAL) BY MOUTH 2 (TWO) TIMES DAILY.    Dispense:  180 tablet    Refill:  1    Follow-up: Return in about 6 months (around 06/03/2022) for Chronic medical conditions.       Charlott Rakes, MD, FAAFP. [facility] [place], [place]   12/03/2021, 9:51 AM

## 2021-12-03 NOTE — Patient Instructions (Signed)

## 2021-12-04 ENCOUNTER — Other Ambulatory Visit: Payer: Self-pay | Admitting: Family Medicine

## 2021-12-04 LAB — CERVICOVAGINAL ANCILLARY ONLY
Bacterial Vaginitis (gardnerella): POSITIVE — AB
Candida Glabrata: NEGATIVE
Candida Vaginitis: NEGATIVE
Chlamydia: NEGATIVE
Comment: NEGATIVE
Comment: NEGATIVE
Comment: NEGATIVE
Comment: NEGATIVE
Comment: NEGATIVE
Comment: NORMAL
Neisseria Gonorrhea: NEGATIVE
Trichomonas: NEGATIVE

## 2021-12-04 LAB — CMP14+EGFR
ALT: 16 IU/L (ref 0–32)
AST: 27 IU/L (ref 0–40)
Albumin/Globulin Ratio: 1.3 (ref 1.2–2.2)
Albumin: 3.9 g/dL (ref 3.8–4.9)
Alkaline Phosphatase: 121 IU/L (ref 44–121)
BUN/Creatinine Ratio: 14 (ref 9–23)
BUN: 11 mg/dL (ref 6–24)
Bilirubin Total: 0.3 mg/dL (ref 0.0–1.2)
CO2: 27 mmol/L (ref 20–29)
Calcium: 9.6 mg/dL (ref 8.7–10.2)
Chloride: 104 mmol/L (ref 96–106)
Creatinine, Ser: 0.8 mg/dL (ref 0.57–1.00)
Globulin, Total: 3.1 g/dL (ref 1.5–4.5)
Glucose: 85 mg/dL (ref 70–99)
Potassium: 4.6 mmol/L (ref 3.5–5.2)
Sodium: 143 mmol/L (ref 134–144)
Total Protein: 7 g/dL (ref 6.0–8.5)
eGFR: 85 mL/min/{1.73_m2} (ref >59)

## 2021-12-04 LAB — LP+NON-HDL CHOLESTEROL
Cholesterol, Total: 148 mg/dL (ref 100–199)
HDL: 67 mg/dL (ref >39)
LDL Chol Calc (NIH): 67 mg/dL (ref 0–99)
Total Non-HDL-Chol (LDL+VLDL): 81 mg/dL (ref 0–129)
Triglycerides: 74 mg/dL (ref 0–149)
VLDL Cholesterol Cal: 14 mg/dL (ref 5–40)

## 2021-12-04 MED ORDER — METRONIDAZOLE 500 MG PO TABS: 500.0000 mg | ORAL_TABLET | Freq: Two times a day (BID) | ORAL | 0 refills | Status: AC

## 2021-12-18 ENCOUNTER — Ambulatory Visit (INDEPENDENT_AMBULATORY_CARE_PROVIDER_SITE_OTHER): Payer: Medicaid Other | Admitting: Orthopedic Surgery

## 2021-12-18 ENCOUNTER — Encounter: Payer: Self-pay | Admitting: Orthopedic Surgery

## 2021-12-18 ENCOUNTER — Ambulatory Visit (INDEPENDENT_AMBULATORY_CARE_PROVIDER_SITE_OTHER): Payer: Medicaid Other

## 2021-12-18 ENCOUNTER — Other Ambulatory Visit: Payer: Self-pay | Admitting: Physical Medicine and Rehabilitation

## 2021-12-18 VITALS — BP 113/75 | HR 80 | Ht 65.0 in | Wt 163.0 lb

## 2021-12-18 DIAGNOSIS — G894 Chronic pain syndrome: Secondary | ICD-10-CM

## 2021-12-18 DIAGNOSIS — M5416 Radiculopathy, lumbar region: Secondary | ICD-10-CM

## 2021-12-18 DIAGNOSIS — M48061 Spinal stenosis, lumbar region without neurogenic claudication: Secondary | ICD-10-CM

## 2021-12-18 NOTE — Progress Notes (Signed)
Orthopedic Spine Surgery Office Note  Assessment: Patient is a 56 y.o. female with low back pain that radiates into her legs in multiple distributions. Worst leg pain goes into posterolateral aspect   Plan: -Explained that initially conservative treatment is tried as a significant number of patients may experience relief with these treatment modalities. Discussed that the conservative treatments include:  -activity modification  -physical therapy  -over the counter pain medications  -medrol dosepak  -lumbar steroid injections -Patient has tried injections, activity modification, over-the-counter medications, physical therapy  -Would need to be nicotine free if we ever consider operative management -Recommended L5/S1 transforaminal injection for diagnostic and therapeutic purposes -Patient should return to office in 4 weeks, repeat x-rays of lumbar spine at next visit: none   Patient expressed understanding of the plan and all questions were answered to the patient's satisfaction.   ___________________________________________________________________________   History:  Patient is a 56 y.o. female who presents today for lumbar spine. Patient has had several years of low back pain that radiates into her bilateral legs. There was no trauma or injury that preceded the onset of pain. Pain has been getting progressively worse. She feels her legs have gotten weak within the last few months as well. Is using a cane due to the leg weakness. Has numbness and paresthesias into her legs, mostly posterior aspect. Pain is felt with activity and rest. It is worse with activity. She feels the pain on a daily basis. Did have one injection that gave her about three months of relief but she cannot remember when that was or what level/area was targeted. No other treatments have provided her any relief to date. She is working on trying to do a spinal cord stimulator trial for her chronic back pain.     Weakness: yes, feels her legs are weaker Hand clumsiness: denies  Symptoms of imbalance: yes, when her legs feel weaker she feels off balance Paresthesias and numbness: yes, into posterior aspect of legs. No other numbness or paresthesias Bowel or bladder incontinence: denies Saddle anesthesia: denies  Treatments tried: injections, activity modification, over-the-counter medications, physical therapy   Review of systems: Denies fevers and chills, night sweats, unexplained weight loss, history of cancer, pain that wakes them at night  Past medical history: HLD Depression Anxiety GERD COPD Chronic pain Emphysema  Allergies: penicillins (rash)  Past surgical history:  Left ankle fracture ORIF Urethral diverticulum repair Partial hysterectomy  Abdominal surgery  Social history: Reports use of nicotine product (smoking, vaping, patches, smokeless) - 1 pack per week Alcohol use: denies Denies recreational drug use   Physical Exam:  General: no acute distress, appears stated age Neurologic: alert, answering questions appropriately, following commands Respiratory: unlabored breathing on room air, symmetric chest rise Psychiatric: appropriate affect, normal cadence to speech   MSK (spine):  -Strength exam      Left  Right EHL    4/5  4/5 TA    5/5  5/5 GSC    5/5  5/5 Knee extension  5/5  5/5 Hip flexion   4+/5  4+/5  Hip flexion strengthen testing participation limited by pain in low back  -Sensory exam    Sensation intact to light touch in L3-S1 nerve distributions of bilateral lower extremities  -Biceps DTR: 2/4 on the left, 2/4 on the right -Brachioradialis DTR: 2/4 on the left, 2/4 on the right -Achilles DTR: 2/4 on the left, 2/4 on the right -Patellar tendon DTR: 2/4 on the left, 2/4 on the right  -  Hoffman: negative bilaterally -Grip and release test: negative -Romberg: negative -No instability with tandem gait -Able to toe walk -Straight leg  raise: negative -Contralateral straight leg raise: negative -Femoral nerve stretch test: negative -Clonus: no beats bilaterally  -Left hip exam: pain with internal rotation but not the same pain she is presenting for, no pain through remainder of range of motion, negative FABER, negative stinchfield -Right hip exam: pain with internal rotation but not the same pain she is presenting for, no pain through remainder of range of motion, negative FABER, negative stinchfield  Imaging: XR of the lumbar spine from 12/18/2021 and 08/27/2021 was independently reviewed and interpreted, showing disc height loss at L5/S1 and L2/3. Vacuum disc phenomenon at those levels. No fracture or dislocation. No evidence of instability on flexion/extension.   MRI of the lumbar spine from 09/27/2020 was independently reviewed and interpreted, showing DDD at multiple levels in the lumbar spine. Foraminal stenosis at L5/S1.    Patient name: Cheryl King Patient MRN: [id number] Date of visit: 12/18/21

## 2021-12-25 ENCOUNTER — Other Ambulatory Visit: Payer: Self-pay | Admitting: Family Medicine

## 2021-12-25 ENCOUNTER — Encounter: Payer: Self-pay | Admitting: Family Medicine

## 2021-12-25 DIAGNOSIS — G8929 Other chronic pain: Secondary | ICD-10-CM

## 2021-12-25 DIAGNOSIS — R0982 Postnasal drip: Secondary | ICD-10-CM

## 2021-12-25 MED ORDER — FLUTICASONE PROPIONATE 50 MCG/ACT NA SUSP: 1.0000 | Freq: Every day | NASAL | 1 refills | Status: DC

## 2021-12-25 MED ORDER — PREGABALIN 75 MG PO CAPS: 75.0000 mg | ORAL_CAPSULE | Freq: Three times a day (TID) | ORAL | 5 refills | Status: DC

## 2021-12-25 MED ORDER — PREGABALIN 75 MG PO CAPS
75.0000 mg | ORAL_CAPSULE | Freq: Three times a day (TID) | ORAL | 5 refills | Status: DC
Start: 2021-12-25 — End: 2022-06-03

## 2021-12-28 ENCOUNTER — Encounter (HOSPITAL_COMMUNITY): Payer: Self-pay

## 2021-12-28 ENCOUNTER — Emergency Department (HOSPITAL_COMMUNITY): Payer: Medicaid Other

## 2021-12-28 ENCOUNTER — Emergency Department (HOSPITAL_COMMUNITY)
Admission: EM | Admit: 2021-12-28 | Discharge: 2021-12-28 | Discharge status: Against Medical Advice | Payer: Medicaid Other | Attending: Emergency Medicine | Admitting: Emergency Medicine

## 2021-12-28 ENCOUNTER — Other Ambulatory Visit: Payer: Self-pay

## 2021-12-28 DIAGNOSIS — R059 Cough, unspecified: Secondary | ICD-10-CM | POA: Diagnosis not present

## 2021-12-28 DIAGNOSIS — Z5321 Procedure and treatment not carried out due to patient leaving prior to being seen by health care provider: Secondary | ICD-10-CM | POA: Insufficient documentation

## 2021-12-28 DIAGNOSIS — M549 Dorsalgia, unspecified: Secondary | ICD-10-CM | POA: Diagnosis not present

## 2021-12-28 LAB — CBC WITH DIFFERENTIAL/PLATELET
Abs Immature Granulocytes: 0.03 10*3/uL (ref 0.00–0.07)
Basophils Absolute: 0 10*3/uL (ref 0.0–0.1)
Basophils Relative: 0 %
Eosinophils Absolute: 0.1 10*3/uL (ref 0.0–0.5)
Eosinophils Relative: 1 %
HCT: 41.7 % (ref 36.0–46.0)
Hemoglobin: 13.9 g/dL (ref 12.0–15.0)
Immature Granulocytes: 0 %
Lymphocytes Relative: 21 %
Lymphs Abs: 1.8 10*3/uL (ref 0.7–4.0)
MCH: 31.4 pg (ref 26.0–34.0)
MCHC: 33.3 g/dL (ref 30.0–36.0)
MCV: 94.1 fL (ref 80.0–100.0)
Monocytes Absolute: 1.2 10*3/uL — ABNORMAL HIGH (ref 0.1–1.0)
Monocytes Relative: 13 %
Neutro Abs: 5.7 10*3/uL (ref 1.7–7.7)
Neutrophils Relative %: 65 %
Platelets: 188 10*3/uL (ref 150–400)
RBC: 4.43 MIL/uL (ref 3.87–5.11)
RDW: 15.9 % — ABNORMAL HIGH (ref 11.5–15.5)
WBC: 8.9 10*3/uL (ref 4.0–10.5)
nRBC: 0 % (ref 0.0–0.2)

## 2021-12-28 LAB — COMPREHENSIVE METABOLIC PANEL
ALT: 22 U/L (ref 0–44)
AST: 29 U/L (ref 15–41)
Albumin: 3.2 g/dL — ABNORMAL LOW (ref 3.5–5.0)
Alkaline Phosphatase: 94 U/L (ref 38–126)
Anion gap: 7 (ref 5–15)
BUN: 13 mg/dL (ref 6–20)
CO2: 25 mmol/L (ref 22–32)
Calcium: 9 mg/dL (ref 8.9–10.3)
Chloride: 105 mmol/L (ref 98–111)
Creatinine, Ser: 0.72 mg/dL (ref 0.44–1.00)
GFR, Estimated: >60 mL/min (ref >60)
Glucose, Bld: 126 mg/dL — ABNORMAL HIGH (ref 70–99)
Potassium: 3.9 mmol/L (ref 3.5–5.1)
Sodium: 137 mmol/L (ref 135–145)
Total Bilirubin: 0.3 mg/dL (ref 0.3–1.2)
Total Protein: 7.1 g/dL (ref 6.5–8.1)

## 2021-12-28 LAB — TROPONIN I (HIGH SENSITIVITY): Troponin I (High Sensitivity): 3 ng/L (ref <18)

## 2021-12-28 NOTE — ED Triage Notes (Signed)
Pt states that she has been having constant 10/10 Left sided back pain that also is felt under & around her side to under Left breast for about 1 week, reports coughing for 1 week prior to that. A/Ox4. Endorses hard to breathe or do anything d/t the pain.

## 2021-12-28 NOTE — ED Notes (Signed)
The patient's family member stated that the patient was still having chest pain, but the clinical staffed never performed an EKG. This EMT informed the patient that the clinical staff would be made aware of same. The family member also stated that she would be leaving the patient and that another family member would coming to sit with the patient.

## 2021-12-28 NOTE — ED Notes (Signed)
Pt left stating the was too long and that they would go see their pulmonary doctor in the morning

## 2021-12-28 NOTE — ED Provider Triage Note (Addendum)
Emergency Medicine Provider Triage Evaluation Note  Cheryl King , a 56 y.o. female  was evaluated in triage.  Pt complains of left mid-upper back pain over the last week.  1 week prior to this had excessive coughing.  Pain worsened with deep cough or inspiration.  Hx of chronic lower back pain, however patient states this is different in type and location.  Denies midline back pain, or recent blunt/direct traumatic injury.  Denies fever or urinary/bowel symptoms.  Review of Systems  Positive:  Negative: See above  Physical Exam  BP 117/80   Pulse 78   Temp 98.6 F (37 C)   Resp 16   SpO2 92%  Gen:   Awake, no distress, sitting in chair Resp:  Normal effort, equal chest rise, CTAB MSK:   Moves extremities without difficulty  Other:  Tenderness of mid upper left back, no midline tenderness.  Chest nonTTP.  No crepitus or swelling.  Medical Decision Making  Medically screening exam initiated at 3:26 PM.  Appropriate orders placed.  Cheryl King was informed that the remainder of the evaluation will be completed by another provider, this initial triage assessment does not replace that evaluation, and the importance of remaining in the ED until their evaluation is complete.     Cheryl Rome, PA-C 33/82/50 1534   UPDATE: per nursing staff pt now endorsing chest pain.  Labs, EKG ordered.   Cheryl Rome, PA-C 53/97/67 [id number]

## 2021-12-29 ENCOUNTER — Encounter (HOSPITAL_BASED_OUTPATIENT_CLINIC_OR_DEPARTMENT_OTHER): Payer: Self-pay | Admitting: Pulmonary Disease

## 2021-12-29 ENCOUNTER — Telehealth: Payer: Self-pay | Admitting: Pulmonary Disease

## 2021-12-29 NOTE — Telephone Encounter (Signed)
Called the pt and there was no answer- LMTCB    

## 2021-12-30 NOTE — Telephone Encounter (Signed)
Left message for patient to call back  

## 2021-12-30 NOTE — Telephone Encounter (Signed)
Calling again. Keeps missing calls from Triage:  Patient states having lung pain. Pharmacy is [facility]. Patient phone number is [phone].

## 2021-12-30 NOTE — Telephone Encounter (Signed)
ATC x2.  LVM to return call.

## 2021-12-31 ENCOUNTER — Encounter: Payer: Self-pay | Admitting: Family Medicine

## 2021-12-31 NOTE — Telephone Encounter (Signed)
Patient is returning phone call. Patient phone number is [phone].

## 2021-12-31 NOTE — Telephone Encounter (Signed)
Patient is returning Cherina's call.  Would like a call back before she schedules an appt.  CB# [phone]

## 2022-01-01 ENCOUNTER — Encounter (HOSPITAL_BASED_OUTPATIENT_CLINIC_OR_DEPARTMENT_OTHER): Payer: Self-pay | Admitting: Pulmonary Disease

## 2022-01-01 ENCOUNTER — Ambulatory Visit (INDEPENDENT_AMBULATORY_CARE_PROVIDER_SITE_OTHER): Payer: Medicaid Other | Admitting: Pulmonary Disease

## 2022-01-01 VITALS — BP 118/76 | HR 95 | Ht 65.0 in | Wt 167.0 lb

## 2022-01-01 DIAGNOSIS — J9621 Acute and chronic respiratory failure with hypoxia: Secondary | ICD-10-CM

## 2022-01-01 DIAGNOSIS — J449 Chronic obstructive pulmonary disease, unspecified: Secondary | ICD-10-CM | POA: Diagnosis not present

## 2022-01-01 DIAGNOSIS — J441 Chronic obstructive pulmonary disease with (acute) exacerbation: Secondary | ICD-10-CM | POA: Diagnosis not present

## 2022-01-01 MED ORDER — PREDNISONE 20 MG PO TABS: 40.0000 mg | ORAL_TABLET | Freq: Every day | ORAL | 0 refills | Status: AC

## 2022-01-01 MED ORDER — NYSTATIN 100000 UNIT/ML MT SUSP: 5.0000 mL | Freq: Four times a day (QID) | OROMUCOSAL | 0 refills | Status: AC

## 2022-01-01 NOTE — Progress Notes (Signed)
   Subjective:    Patient ID: Cheryl King, female    DOB: 1965/07/05, 56 y.o.   MRN: [id number]  HPI  56 year old smoker who presents for acute visit She smokes about half pack per day starting as a teenager for 40 years, more than 20 pack years   She was treated for COPD flare in February with doxycycline and prednisone  She completed pulmonary rehab and was noted to desaturate to 84% and was started on oxygen 02/2021  Acute visit today She reports left sided chest pain is persistent last week. Worsens with certain positions. Improves with Tylenol 3. She had presented to the ED for this pain. This was preceded with coughing. She is unable to bring up mucous due to the pain. Has worsening wheezing and shortness of breath. Uses albuterol very frequently >5 times a day. She follows orthopedic surgery for chronic back pain, never had surgery, just injections. Compliant with Symbicort and Spiriva. Compliant with oxygen during the day now, previously at night.   Significant tests/ events reviewed  PFTs 02/2021 >> severe airway obstruction, ratio 52, FEV1 44%, FVC 67%, DLCO 33%  08/2020 CT chest >> 92m nodule RLL unchanged since 06/2019  Review of Systems  Constitutional:  Negative for chills, diaphoresis and fever.  HENT:  Negative for congestion.   Respiratory:  Negative for cough, shortness of breath and wheezing.   Cardiovascular:  Negative for chest pain, palpitations and leg swelling.     Objective:   Physical Exam  Physical Exam: General: Well-appearing, no acute distress HENT: [place], AT Eyes: EOMI, no scleral icterus Respiratory: Diminished but clear to auscultation bilaterally.  No crackles, wheezing or rales Cardiovascular: RRR, -M/R/G, no JVD Extremities:-Edema,-tenderness Neuro: AAO x4, CNII-XII grossly intact Psych: Normal mood, normal affect  CXR 12/28/21 No acute infiltrate effusion or edema     Assessment & Plan:   COPD exacerbation Prednisone 40 mg x 5  days  Acute on chronic hypoxemic respiratory failure Ambulatory in-clinic today Recommended 4L with activity and sleep  Oral thrush Nystatin wash ordered  Lung pain, suspect related to cough and back pain Steroids as above Scheduled with Orthopedics next week

## 2022-01-01 NOTE — Patient Instructions (Addendum)
COPD exacerbation Prednisone 40 mg x 5 days  Acute on chronic hypoxemic respiratory failure Ambulatory in-clinic today Recommended 4L with activity and sleep  Oral thrush Nystatin wash ordered  Lung pain, suspect related to cough and back pain Steroids as above Scheduled with Orthopedics next week  Follow-up with Dr. Elsworth King in 6 weeks

## 2022-01-01 NOTE — Telephone Encounter (Signed)
Spoke with the pt  She is scheduled for acute visit today  I advised that she keep appt for eval of her "lung pain"  Nothing further needed

## 2022-01-04 ENCOUNTER — Ambulatory Visit (INDEPENDENT_AMBULATORY_CARE_PROVIDER_SITE_OTHER): Payer: Medicaid Other | Admitting: Physical Medicine and Rehabilitation

## 2022-01-04 ENCOUNTER — Ambulatory Visit: Payer: Self-pay

## 2022-01-04 VITALS — BP 125/70 | HR 69

## 2022-01-04 DIAGNOSIS — M5416 Radiculopathy, lumbar region: Secondary | ICD-10-CM | POA: Diagnosis not present

## 2022-01-04 DIAGNOSIS — G8929 Other chronic pain: Secondary | ICD-10-CM

## 2022-01-04 MED ORDER — METHYLPREDNISOLONE ACETATE 80 MG/ML IJ SUSP
80.0000 mg | Freq: Once | INTRAMUSCULAR | Status: AC
  Administered 2022-01-04: 80 mg

## 2022-01-04 NOTE — Progress Notes (Signed)
Numeric Pain Rating Scale and Functional Assessment Average Pain 8   In the last MONTH (on 0-10 scale) has pain interfered with the following?  1. General activity like being  able to carry out your everyday physical activities such as walking, climbing stairs, carrying groceries, or moving a chair?  Rating(10)   +Driver, -BT, -Dye Allergies.  Lower back pain with radiation going down both legs to the feet

## 2022-01-04 NOTE — Patient Instructions (Signed)

## 2022-01-06 MED ORDER — ACETAMINOPHEN-CODEINE 300-30 MG PO TABS: 1.0000 | ORAL_TABLET | Freq: Four times a day (QID) | ORAL | 0 refills | Status: AC | PRN

## 2022-01-09 NOTE — Progress Notes (Signed)
Cheryl King - 56 y.o. female MRN [id number]  Date of birth: 05-May-1965  Office Visit Note: Visit Date: 01/04/2022 PCP: Charlott Rakes, MD Referred by: Callie Fielding, MD  Subjective: Chief Complaint  Patient presents with   Lower Back - Pain   HPI:  Cheryl King is a 56 y.o. female who comes in today at the request of Dr. Ileene Rubens for planned Bilateral L5-S1 Lumbar Transforaminal epidural steroid injection with fluoroscopic guidance.  The patient has failed conservative care including home exercise, medications, time and activity modification.  This injection will be diagnostic and hopefully therapeutic.  Please see requesting physician notes for further details and justification. MRI reviewed with images and spine model.  MRI reviewed in the note below.  Patient was formally being seen by Dr. Basil Dess.  Spinal cord stimulator trial was initiated with evaluation but patient lost contact with [place] and the [employer].  She would like to reapproach that potential procedure.  She had limited relief with prior epidural injection.  She has had normal electrodiagnostic study.   ROS Otherwise per HPI.  Assessment & Plan: Visit Diagnoses:    ICD-10-CM   1. Lumbar radiculopathy  M54.16 XR C-ARM NO REPORT    Epidural Steroid injection    methylPREDNISolone acetate (DEPO-MEDROL) injection 80 mg      Plan: No additional findings.   Meds & Orders:  Meds ordered this encounter  Medications   methylPREDNISolone acetate (DEPO-MEDROL) injection 80 mg    Orders Placed This Encounter  Procedures   XR C-ARM NO REPORT   Epidural Steroid injection    Follow-up: Return for visit to requesting provider as needed.   Procedures: No procedures performed  Lumbosacral Transforaminal Epidural Steroid Injection - Sub-Pedicular Approach with Fluoroscopic Guidance  Patient: Cheryl King      Date of Birth: 07-Dec-1965 MRN: [id number] PCP: Charlott Rakes,  MD      Visit Date: 01/04/2022   Universal Protocol:    Date/Time: 01/04/2022  Consent Given By: the patient  Position: PRONE  Additional Comments: Vital signs were monitored before and after the procedure. Patient was prepped and draped in the usual sterile fashion. The correct patient, procedure, and site was verified.   Injection Procedure Details:   Procedure diagnoses: Lumbar radiculopathy [M54.16]    Meds Administered:  Meds ordered this encounter  Medications   methylPREDNISolone acetate (DEPO-MEDROL) injection 80 mg    Laterality: Bilateral  Location/Site: L5  Needle:5.0 in., 22 ga.  Short bevel or Quincke spinal needle  Needle Placement: Transforaminal  Findings:    -Comments: Excellent flow of contrast along the nerve, nerve root and into the epidural space.  Procedure Details: After squaring off the end-plates to get a true AP view, the C-arm was positioned so that an oblique view of the foramen as noted above was visualized. The target area is just inferior to the "nose of the scotty dog" or sub pedicular. The soft tissues overlying this structure were infiltrated with 2-3 ml. of 1% Lidocaine without Epinephrine.  The spinal needle was inserted toward the target using a "trajectory" view along the fluoroscope beam.  Under AP and lateral visualization, the needle was advanced so it did not puncture dura and was located close the 6 O'Clock position of the pedical in AP tracterory. Biplanar projections were used to confirm position. Aspiration was confirmed to be negative for CSF and/or blood. A 1-2 ml. volume of Isovue-250 was injected and flow of contrast was noted at each level.  Radiographs were obtained for documentation purposes.   After attaining the desired flow of contrast documented above, a 0.5 to 1.0 ml test dose of 0.25% Marcaine was injected into each respective transforaminal space.  The patient was observed for 90 seconds post injection.  After no  sensory deficits were reported, and normal lower extremity motor function was noted,   the above injectate was administered so that equal amounts of the injectate were placed at each foramen (level) into the transforaminal epidural space.   Additional Comments:  No complications occurred Dressing: 2 x 2 sterile gauze and Band-Aid    Post-procedure details: Patient was observed during the procedure. Post-procedure instructions were reviewed.  Patient left the clinic in stable condition.    Clinical History: EXAM: MRI LUMBAR SPINE WITHOUT AND WITH CONTRAST   TECHNIQUE: Multiplanar and multiecho pulse sequences of the lumbar spine were obtained without and with intravenous contrast.   CONTRAST:  3m MULTIHANCE GADOBENATE DIMEGLUMINE 529 MG/ML IV SOLN   COMPARISON:  01/05/2017   FINDINGS: Segmentation:  5 lumbar type vertebrae   Alignment:  Mild scoliosis.  No listhesis   Vertebrae: No fracture, evidence of discitis, or bone lesion. Endplate sclerosis has developed at L2-3 and L5-S1, degenerative appearing.   Conus medullaris and cauda equina: Conus extends to the L1 level. Conus and cauda equina appear normal.   Paraspinal and other soft tissues: No perispinal mass or inflammation noted.   Disc levels:   T11-12: Chronic disc collapse with ventral protrusion.   T12- L1: Unremarkable.   L1-L2: Disc narrowing and bulging with endplate degeneration. Patent canal and foramina   L2-L3: Disc narrowing and bulging with endplate degeneration. Circumferential disc bulging and facet spurring. Mild or moderate left foraminal narrowing   L3-L4: Disc narrowing and bulging with facet spurring. Patent canal and foramina   L4-L5: Disc narrowing and bulging. Degenerative facet spurring greater on the right. Mild-to-moderate right foraminal narrowing   L5-S1:Disc collapse and endplate degeneration with bulge. Degenerative facet spurring on the right more than left.  Biforaminal impingement, worse on the right and progressed.   IMPRESSION: 1. Generalized lumbar spine degeneration with progression from 2018, especially at L5-S1 where there is right more than left foraminal impingement. 2. Diffusely patent spinal canal. 3. Mild scoliosis.     Electronically Signed   By: JJorje GuildM.D.   On: 09/30/2020 09:57     Objective:  VS:  HT:    WT:   BMI:     BP:125/70  HR:69bpm  TEMP: ( )  RESP:  Physical Exam Vitals and nursing note reviewed.  Constitutional:      General: She is not in acute distress.    Appearance: Normal appearance. She is not ill-appearing.  HENT:     Head: Normocephalic and atraumatic.     Right Ear: External ear normal.     Left Ear: External ear normal.  Eyes:     Extraocular Movements: Extraocular movements intact.  Cardiovascular:     Rate and Rhythm: Normal rate.     Pulses: Normal pulses.  Pulmonary:     Effort: Pulmonary effort is normal. No respiratory distress.  Abdominal:     General: There is no distension.     Palpations: Abdomen is soft.  Musculoskeletal:        General: Tenderness present.     Cervical back: Neck supple.     Right lower leg: No edema.     Left lower leg: No edema.  Comments: Patient has good distal strength with no pain over the greater trochanters.  No clonus or focal weakness.  Skin:    Findings: No erythema, lesion or rash.  Neurological:     General: No focal deficit present.     Mental Status: She is alert and oriented to person, place, and time.     Sensory: No sensory deficit.     Motor: No weakness or abnormal muscle tone.     Coordination: Coordination normal.  Psychiatric:        Mood and Affect: Mood normal.        Behavior: Behavior normal.      Imaging: No results found.

## 2022-01-09 NOTE — Procedures (Signed)
Lumbosacral Transforaminal Epidural Steroid Injection - Sub-Pedicular Approach with Fluoroscopic Guidance  Patient: Cheryl King      Date of Birth: [birth year] MRN: [id number] PCP: Charlott Rakes, MD      Visit Date: 01/04/2022   Universal Protocol:    Date/Time: 01/04/2022  Consent Given By: the patient  Position: PRONE  Additional Comments: Vital signs were monitored before and after the procedure. Patient was prepped and draped in the usual sterile fashion. The correct patient, procedure, and site was verified.   Injection Procedure Details:   Procedure diagnoses: Lumbar radiculopathy [M54.16]    Meds Administered:  Meds ordered this encounter  Medications   methylPREDNISolone acetate (DEPO-MEDROL) injection 80 mg    Laterality: Bilateral  Location/Site: L5  Needle:5.0 in., 22 ga.  Short bevel or Quincke spinal needle  Needle Placement: Transforaminal  Findings:    -Comments: Excellent flow of contrast along the nerve, nerve root and into the epidural space.  Procedure Details: After squaring off the end-plates to get a true AP view, the C-arm was positioned so that an oblique view of the foramen as noted above was visualized. The target area is just inferior to the "nose of the scotty dog" or sub pedicular. The soft tissues overlying this structure were infiltrated with 2-3 ml. of 1% Lidocaine without Epinephrine.  The spinal needle was inserted toward the target using a "trajectory" view along the fluoroscope beam.  Under AP and lateral visualization, the needle was advanced so it did not puncture dura and was located close the 6 O'Clock position of the pedical in AP tracterory. Biplanar projections were used to confirm position. Aspiration was confirmed to be negative for CSF and/or blood. A 1-2 ml. volume of Isovue-250 was injected and flow of contrast was noted at each level. Radiographs were obtained for documentation purposes.   After attaining the  desired flow of contrast documented above, a 0.5 to 1.0 ml test dose of 0.25% Marcaine was injected into each respective transforaminal space.  The patient was observed for 90 seconds post injection.  After no sensory deficits were reported, and normal lower extremity motor function was noted,   the above injectate was administered so that equal amounts of the injectate were placed at each foramen (level) into the transforaminal epidural space.   Additional Comments:  No complications occurred Dressing: 2 x 2 sterile gauze and Band-Aid    Post-procedure details: Patient was observed during the procedure. Post-procedure instructions were reviewed.  Patient left the clinic in stable condition.

## 2022-01-12 ENCOUNTER — Telehealth: Payer: Self-pay | Admitting: Pulmonary Disease

## 2022-01-12 NOTE — Telephone Encounter (Signed)
[facility] called for Office visit notes from November and December. Pt was not seen in November but was seen in December in our office. Faxed office notes to [phone]. Nothing further needed.

## 2022-01-15 ENCOUNTER — Ambulatory Visit: Payer: Medicaid Other | Admitting: Orthopedic Surgery

## 2022-01-15 DIAGNOSIS — M5416 Radiculopathy, lumbar region: Secondary | ICD-10-CM

## 2022-01-15 MED ORDER — ACETAMINOPHEN-CODEINE 300-30 MG PO TABS: 1.0000 | ORAL_TABLET | Freq: Four times a day (QID) | ORAL | 0 refills | Status: AC | PRN

## 2022-01-15 NOTE — Progress Notes (Signed)
Orthopedic Spine Surgery Office Note  Assessment: Patient is a 56 y.o. female with chronic low back pain. Has pain radiating into her legs in several distributions but the worst is along the posterior aspect of the legs. Got about 65% improvement with L5/S1 transforaminal injection. Has foraminal stenosis bilaterally at L5/S1.    Plan: -Explained that initially conservative treatment is tried as a significant number of patients may experience relief with these treatment modalities. Discussed that the conservative treatments include:  -activity modification  -physical therapy  -over the counter pain medications  -medrol dosepak  -lumbar steroid injections -Patient has tried  injections, activity modification, over-the-counter medications, physical therapy  -May have to refer her to pain management at next visit for her chronic low back pain -Can continue with injections if they give her several months relief, will not consider surgery until she is nicotine free -Patient should return to office in 8 weeks, x-rays at next visit: none   Patient expressed understanding of the plan and all questions were answered to the patient's satisfaction.   ___________________________________________________________________________  History: Patient is a 56 y.o. female who has been previously seen in the office for symptoms consistent with lumbar radiculopathy. Since the last visit, she got an L5-S1 transforaminal injection.  She states that she had about 65% relief with that injection.  Her back pain is still present but her leg pain is what got significantly better with that injection.  Not currently having any numbness or paresthesias.  No change in bowel or bladder habits.  Denies saddle anesthesia.  Previous treatments:  injections, activity modification, over-the-counter medications, physical therapy   COPY OF FIRST NOTE Patient is a 56 y.o. female who presents today for lumbar spine. Patient has had  several years of low back pain that radiates into her bilateral legs. There was no trauma or injury that preceded the onset of pain. Pain has been getting progressively worse. She feels her legs have gotten weak within the last few months as well. Is using a cane due to the leg weakness. Has numbness and paresthesias into her legs, mostly posterior aspect. Pain is felt with activity and rest. It is worse with activity. She feels the pain on a daily basis. Did have one injection that gave her about three months of relief but she cannot remember when that was or what level/area was targeted. No other treatments have provided her any relief to date. She is working on trying to do a spinal cord stimulator trial for her chronic back pain.      Weakness: yes, feels her legs are weaker Hand clumsiness: denies  Symptoms of imbalance: yes, when her legs feel weaker she feels off balance Paresthesias and numbness: yes, into posterior aspect of legs. No other numbness or paresthesias Bowel or bladder incontinence: denies Saddle anesthesia: denies END OF COPY  Physical Exam:  General: no acute distress, appears stated age Neurologic: alert, answering questions appropriately, following commands Respiratory: unlabored breathing on room air, symmetric chest rise Psychiatric: appropriate affect, normal cadence to speech   MSK (spine):  -Strength exam      Left  Right EHL    5/5  5/5 TA    5/5  5/5 GSC    5/5  5/5 Knee extension  5/5  5/5 Hip flexion   4+/5  4+/5  Hip flexion strengthen testing participation limited by pain in low back   -Sensory exam    Sensation intact to light touch in L3-S1 nerve distributions of bilateral  lower extremities  -Achilles DTR: 2/4 on the left, 2/4 on the right -Patellar tendon DTR: 2/4 on the left, 2/4 on the right  -Straight leg raise: negative -Contralateral straight leg raise: negative -Clonus: no beats bilaterally  Imaging: XR of the lumbar spine from  12/18/2021 and 08/27/2021 was previously independently reviewed and interpreted, showing disc height loss at L5/S1 and L2/3. Vacuum disc phenomenon at those levels. No fracture or dislocation. No evidence of instability on flexion/extension.    MRI of the lumbar spine from 09/27/2020 was independently reviewed and previously interpreted, showing DDD at multiple levels in the lumbar spine. Foraminal stenosis at L5/S1.    Patient name: Cheryl King Patient MRN: [id number] Date of visit: 01/15/22

## 2022-01-27 ENCOUNTER — Other Ambulatory Visit: Payer: Self-pay | Admitting: Internal Medicine

## 2022-01-28 NOTE — Telephone Encounter (Signed)
Requested medication (s) are due for refill today: routing for approval  Requested medication (s) are on the active medication list: yes  Last refill:  11/10/21  Future visit scheduled: yes  Notes to clinic:  Manual Review: Do not refill starter pack. 1 mg tabs may be extended up to one year if the patient has quit smoking but still feels at risk for relapse.      Requested Prescriptions  Pending Prescriptions Disp Refills   Varenicline Tartrate, Starter, 0.5 MG X 11 & 1 MG X 42 TBPK [Pharmacy Med Name: VARENICLINE STARTING MONTH BOX] 53 each 0    Sig: USE AS DIRECTED ON PACKAGE     Psychiatry:  Drug Dependence Therapy - varenicline Failed - 01/27/2022  7:53 PM      Failed - Manual Review: Do not refill starter pack. 1 mg tabs may be extended up to one year if the patient has quit smoking but still feels at risk for relapse.      Passed - Cr in normal range and within 180 days    Creat  Date Value Ref Range Status  04/13/2013 0.74 0.50 - 1.10 mg/dL Final   Creatinine, Ser  Date Value Ref Range Status  12/28/2021 0.72 0.44 - 1.00 mg/dL Final         Passed - Completed PHQ-2 or PHQ-9 in the last 360 days      Passed - Valid encounter within last 6 months    Recent Outpatient Visits           1 month ago Encounter for screening mammogram for malignant neoplasm of breast   [place], [place]ong, MD   8 months ago Pedal edema   [place], Enobong, MD   12 months ago Need for shingles vaccine   [place], RPH-CPP   1 year ago Bilateral low back pain with bilateral sciatica, unspecified chronicity   [place], Enobong, MD   1 year ago Chronic tension-type headache, not intractable   [place], Enobong, MD       Future Appointments             In 1 week Charlott Rakes, MD [place]   In 2 weeks Rigoberto Noel, MD [place]   In 1 month Laurance Flatten Venia Carbon, MD [facility]   In 4 months Charlott Rakes, MD [place]

## 2022-02-03 ENCOUNTER — Ambulatory Visit: Payer: Medicaid Other

## 2022-02-08 ENCOUNTER — Ambulatory Visit: Payer: Medicaid Other | Attending: Family Medicine | Admitting: Family Medicine

## 2022-02-08 DIAGNOSIS — J9611 Chronic respiratory failure with hypoxia: Secondary | ICD-10-CM | POA: Diagnosis not present

## 2022-02-08 DIAGNOSIS — K0889 Other specified disorders of teeth and supporting structures: Secondary | ICD-10-CM | POA: Diagnosis not present

## 2022-02-08 NOTE — Progress Notes (Signed)
Virtual Visit via Video Note  I connected with Cheryl King, on 02/08/2022 at 11:15 AM by video enabled telemedicine device and verified that I am speaking with the correct person using two identifiers.   Consent: I discussed the limitations, risks, security and privacy concerns of performing an evaluation and management service by telemedicine and the availability of in person appointments. I also discussed with the patient that there may be a patient responsible charge related to this service. The patient expressed understanding and agreed to proceed.   Location of Patient: Environmental education officer of Provider: Clinic   Persons participating in Telemedicine visit: Verle Brillhart Dr. Margarita Rana     History of Present Illness: Cheryl King is a 58 y.o. year old female  with a history of Prediabetes (diet controlled A1c 5.9) chronic low back pain with sciatica, GERD, hyperlipidemia,  Emphysema (on 4L oxygen) tobacco abuse.   Her oxygen requirement was increased to 4L especially on exertion but at rest she does fine without oxygen.  This led her to think she might need a sleep study to be evaluated for sleep apnea. On further questioning she denies presence of waking up to catch her breath, or Symptoms.  She sometimes snores but only when she is fatigued.  She does have chronic headaches which is unchanged and does not have daytime fatigue.  She does not have anyone else at home to observe her for sleep apnea symptoms.  She recently had extraction of 4 teeth and is experiencing pain in her mouth. Past Medical History:  Diagnosis Date   Acid reflux    Allergy    Carpal tunnel syndrome    COPD (chronic obstructive pulmonary disease) (HCC)    Depression    Diverticulitis    Hyperlipidemia    Low back pain    Neck pain    Sacroiliac inflammation (HCC)    Sciatica    Seasonal allergies    Allergies  Allergen Reactions   Percocet [Oxycodone-Acetaminophen] Nausea Only    Vicodin [Hydrocodone-Acetaminophen] Nausea Only   Penicillins Rash    Current Outpatient Medications on File Prior to Visit  Medication Sig Dispense Refill   albuterol (VENTOLIN HFA) 108 (90 Base) MCG/ACT inhaler Inhale 2 puffs into the lungs every 6 (six) hours as needed for wheezing or shortness of breath. 18 g 5   amitriptyline (ELAVIL) 50 MG tablet TAKE 1 TABLET (50 MG TOTAL) BY MOUTH AT BEDTIME. 90 tablet 1   atorvastatin (LIPITOR) 20 MG tablet TAKE 1 TABLET (20 MG TOTAL) BY MOUTH DAILY. 90 tablet 1   Azelastine HCl 137 MCG/SPRAY SOLN Place into both nostrils.     budesonide-formoterol (SYMBICORT) 160-4.5 MCG/ACT inhaler Inhale 2 puffs into the lungs 2 (two) times daily. in the morning and at bedtime. 10.2 each 5   cetirizine (ZYRTEC) 10 MG tablet Take 1 tablet (10 mg total) by mouth daily. 90 tablet 1   cyclobenzaprine (FLEXERIL) 10 MG tablet TAKE 1 TABLET (10 MG TOTAL) BY MOUTH 2 (TWO) TIMES DAILY AS NEEDED FOR MUSCLE SPASMS. 180 tablet 1   diclofenac sodium (VOLTAREN) 1 % GEL Apply 2 g topically 4 (four) times daily. 5 Tube 2   DULoxetine (CYMBALTA) 60 MG capsule TAKE 1 CAPSULE (60 MG TOTAL) BY MOUTH DAILY. 90 capsule 1   esomeprazole (NEXIUM) 40 MG capsule Take 40 mg by mouth 2 (two) times daily.     EYSUVIS 0.25 % SUSP Apply 1 drop to eye 4 (four) times daily.     ezetimibe (  ZETIA) 10 MG tablet TAKE 1 TABLET (10 MG TOTAL) BY MOUTH DAILY AFTER SUPPER. 90 tablet 1   fluconazole (DIFLUCAN) 150 MG tablet Take 1 tablet (150 mg total) by mouth once a week. 2 tablet 0   fluticasone (FLONASE) 50 MCG/ACT nasal spray Place 1 spray into both nostrils daily. 16 g 1   Lactobacillus CAPS Take 1 capsule by mouth daily.     lidocaine (LIDODERM) 5 % Place 1 patch onto the skin daily. Remove & Discard patch within 12 hours or as directed by MD 30 patch 3   miconazole (MICATIN) 2 % cream Apply 1 application topically 2 (two) times daily. 28.35 g 0   Misc. Devices MISC 1. Rolling walker with seat 2.  Shower chair 3. Elevated toilet seat.   Diagnosis-chronic back pain 1 each 0   montelukast (SINGULAIR) 10 MG tablet Take 10 mg by mouth daily.     Multiple Vitamin (MULTIVITAMIN WITH MINERALS) TABS tablet Take 1 tablet by mouth daily.     nicotine (NICODERM CQ - DOSED IN MG/24 HOURS) 21 mg/24hr patch Place 1 patch (21 mg total) onto the skin daily. Then decrease to '14mg'$ /24 hr (Patient not taking: Reported on 12/03/2021) 28 patch 2   oxybutynin (DITROPAN) 5 MG tablet TAKE 1 TABLET (5 MG TOTAL) BY MOUTH 2 (TWO) TIMES DAILY. 180 tablet 1   pregabalin (LYRICA) 75 MG capsule Take 1 capsule (75 mg total) by mouth 3 (three) times daily. 90 capsule 5   RESTASIS 0.05 % ophthalmic emulsion   3   Tiotropium Bromide Monohydrate (SPIRIVA RESPIMAT) 2.5 MCG/ACT AERS Inhale 2 puffs into the lungs daily. 1 each 12   triamcinolone cream (KENALOG) 0.1 % Apply 1 application topically 2 (two) times daily. 45 g 1   Varenicline Tartrate, Starter, 0.5 MG X 11 & 1 MG X 42 TBPK USE AS DIRECTED ON PACKAGE 53 each 0   Current Facility-Administered Medications on File Prior to Visit  Medication Dose Route Frequency Provider Last Rate Last Admin   0.9 %  sodium chloride infusion  500 mL Intravenous Once Nelida Meuse III, MD        ROS: See HPI  Observations/Objective: General: Awake, alert, oriented x 3 ENT: Currently not on oxygen at rest and not dyspneic Respiratory: Normal Cardiac, normal Psych: normal mood Extremities: Normal range of motion     Latest Ref Rng & Units 12/28/2021    6:38 PM 12/03/2021    9:57 AM 06/01/2021    9:53 AM  CMP  Glucose 70 - 99 mg/dL 126  85  89   BUN 6 - 20 mg/dL '13  11  14   '$ Creatinine 0.44 - 1.00 mg/dL 0.72  0.80  0.94   Sodium 135 - 145 mmol/L 137  143  142   Potassium 3.5 - 5.1 mmol/L 3.9  4.6  4.4   Chloride 98 - 111 mmol/L 105  104  105   CO2 22 - 32 mmol/L '25  27  26   '$ Calcium 8.9 - 10.3 mg/dL 9.0  9.6  9.1   Total Protein 6.5 - 8.1 g/dL 7.1  7.0  6.6   Total  Bilirubin 0.3 - 1.2 mg/dL 0.3  0.3  0.2   Alkaline Phos 38 - 126 U/L 94  121  103   AST 15 - 41 U/L '29  27  21   '$ ALT 0 - 44 U/L '22  16  15     '$ Lipid Panel  Component Value Date/Time   CHOL 148 12/03/2021 0957   TRIG 74 12/03/2021 0957   HDL 67 12/03/2021 0957   CHOLHDL 3.2 01/28/2020 1012   CHOLHDL 3.2 12/26/2013 1319   VLDL 18 12/26/2013 1319   LDLCALC 67 12/03/2021 0957   LABVLDL 14 12/03/2021 0957    Lab Results  Component Value Date   HGBA1C 5.9 12/03/2021     Assessment and Plan: 1. Chronic respiratory failure with hypoxia (HCC) Currently does not exhibit symptoms of sleep apnea Advised of symptoms to watch out for and to notify me if her condition changes Continue 4 L of oxygen as prescribed  2.  Dental pain Status post extraction of 4 teeth Ongoing pain but this is managed by her oral surgeon  Follow Up Instructions: Keep previously scheduled appointment   I discussed the assessment and treatment plan with the patient. The patient was provided an opportunity to ask questions and all were answered. The patient agreed with the plan and demonstrated an understanding of the instructions.   The patient was advised to call back or seek an in-person evaluation if the symptoms worsen or if the condition fails to improve as anticipated.     I provided 15 minutes total of Telehealth time during this encounter including median intraservice time, reviewing previous notes, investigations, ordering medications, medical decision making, coordinating care and patient verbalized understanding at the end of the visit.     Charlott Rakes, MD, FAAFP. [facility], [place]   02/08/2022, 11:15 AM

## 2022-02-11 ENCOUNTER — Telehealth: Payer: Self-pay

## 2022-02-11 NOTE — Telephone Encounter (Signed)
Initiated Cheryl King request on [employer] site for SCS trial. Pending review. Order CX#[id number]. Anticipated determination date 02/19/22.

## 2022-02-15 ENCOUNTER — Ambulatory Visit (INDEPENDENT_AMBULATORY_CARE_PROVIDER_SITE_OTHER): Payer: Medicaid Other | Admitting: Pulmonary Disease

## 2022-02-15 ENCOUNTER — Encounter (HOSPITAL_BASED_OUTPATIENT_CLINIC_OR_DEPARTMENT_OTHER): Payer: Self-pay | Admitting: Pulmonary Disease

## 2022-02-15 VITALS — BP 112/74 | HR 78 | Temp 99.3°F | Ht 64.5 in | Wt 173.8 lb

## 2022-02-15 DIAGNOSIS — Z720 Tobacco use: Secondary | ICD-10-CM | POA: Diagnosis not present

## 2022-02-15 DIAGNOSIS — J432 Centrilobular emphysema: Secondary | ICD-10-CM | POA: Diagnosis not present

## 2022-02-15 DIAGNOSIS — R918 Other nonspecific abnormal finding of lung field: Secondary | ICD-10-CM

## 2022-02-15 DIAGNOSIS — J441 Chronic obstructive pulmonary disease with (acute) exacerbation: Secondary | ICD-10-CM

## 2022-02-15 DIAGNOSIS — J9611 Chronic respiratory failure with hypoxia: Secondary | ICD-10-CM

## 2022-02-15 DIAGNOSIS — F17210 Nicotine dependence, cigarettes, uncomplicated: Secondary | ICD-10-CM | POA: Diagnosis not present

## 2022-02-15 MED ORDER — TRELEGY ELLIPTA 100-62.5-25 MCG/ACT IN AEPB: 28.0000 | INHALATION_SPRAY | Freq: Every day | RESPIRATORY_TRACT | 0 refills | Status: DC

## 2022-02-15 NOTE — Assessment & Plan Note (Signed)
Smoking cessation again emphasized is the most important intervention that would add years to her life. She has been able to cut down to 2 cigarettes daily but is still finding difficulty quit

## 2022-02-15 NOTE — Assessment & Plan Note (Signed)
Stable nodule likely benign. We can now enroll her in lung cancer screening program with annual CT due only in July 2024

## 2022-02-15 NOTE — Assessment & Plan Note (Signed)
She can stay off oxygen at rest but does need up to 4 L oxygen on exertion. I also asked her to continue nocturnal O2

## 2022-02-15 NOTE — Progress Notes (Signed)
   Subjective:    Patient ID: Cheryl King, female    DOB: 1965/03/14, 57 y.o.   MRN: [id number]  HPI  57 yo smoker for FU of COPD She smokes about half pack per day starting as a teenager for 40 years, more than 20 pack years   She was treated for COPD flare in February with doxycycline and prednisone  She completed pulmonary rehab and was noted to desaturate to 84% and was started on oxygen 02/2021  Chief Complaint  Patient presents with   Follow-up    Pt states she has some wheezing. Pt went to ER for pain in her left side about a month ago and she has fallen twice.    39-monthfollow-up arrives accompanied by her aunt who is also my patient. She reports of back pain, she had an ED visit in December and then saw my partner Dr. ELoanne Drillingand was given prednisone for 5 days.  She had cough with back pain. She has since seen orthopedics Dr. MLaurance Flattenand was given steroid injections.  They would consider surgery if she is able to quit smoking.  She close continues to smoke 2 cigarettes daily. She is compliant with oxygen. Today arrives without oxygen with saturation 96% on room air. On ambulation saturation dropped and she required up to 4 L of oxygen to maintain  We reviewed previous CT chest and PFTs. She is compliant with Symbicort and Spiriva and wonders if we can combine the 2, previously she has used Trelegy with some benefit  Significant tests/ events reviewed PFTs 02/2021 >> severe airway obstruction, ratio 52, FEV1 44%, FVC 67%, DLCO 33%    07/2021 CT chest >> stable 546mRLL nodule 08/2020 CT chest >> 15m82module RLL unchanged since 06/2019  Review of Systems neg for any significant sore throat, dysphagia, itching, sneezing, nasal congestion or excess/ purulent secretions, fever, chills, sweats, unintended wt loss, pleuritic or exertional cp, hempoptysis, orthopnea pnd or change in chronic leg swelling. Also denies presyncope, palpitations, heartburn, abdominal pain, nausea,  vomiting, diarrhea or change in bowel or urinary habits, dysuria,hematuria, rash, arthralgias, visual complaints, headache, numbness weakness or ataxia.     Objective:   Physical Exam  Gen. Pleasant, obese, in no distress, on O2 ENT - no lesions, no post nasal drip Neck: No JVD, no thyromegaly, no carotid bruits Lungs: no use of accessory muscles, no dullness to percussion, decreased without rales or rhonchi  Cardiovascular: Rhythm regular, heart sounds  normal, no murmurs or gallops, no peripheral edema Musculoskeletal: No deformities, no cyanosis or clubbing , no tremors       Assessment & Plan:

## 2022-02-15 NOTE — Assessment & Plan Note (Signed)
We will switch her to Trelegy for better compliance. She will continue to use albuterol on as-needed basis. We discussed signs and symptoms of COPD exacerbation

## 2022-02-15 NOTE — Patient Instructions (Signed)
X Amb sat  X sample of Treelgy 100 - once daily INSTEAD of symbicort & spiriva  Try to QUIT smoking !  X Referral to lung cancer screening program for CT in july

## 2022-02-17 ENCOUNTER — Telehealth: Payer: Self-pay | Admitting: Physical Medicine and Rehabilitation

## 2022-02-17 ENCOUNTER — Telehealth: Payer: Self-pay

## 2022-02-17 NOTE — Telephone Encounter (Signed)
Missed a call from [facility]

## 2022-02-17 NOTE — Telephone Encounter (Signed)
LVM to return call to schedule spine stimulator

## 2022-02-17 NOTE — Telephone Encounter (Signed)
Spoke with patient and scheduled SCS for 03/02/22

## 2022-03-01 ENCOUNTER — Telehealth: Payer: Self-pay | Admitting: Physical Medicine and Rehabilitation

## 2022-03-01 NOTE — Telephone Encounter (Signed)
Spoke with patient and rescheduled for 03/16/22

## 2022-03-01 NOTE — Telephone Encounter (Signed)
Patient called needing to reschedule her appointment. The number to contact patient is [phone]

## 2022-03-02 ENCOUNTER — Ambulatory Visit: Payer: Medicaid Other | Admitting: Physical Medicine and Rehabilitation

## 2022-03-04 ENCOUNTER — Other Ambulatory Visit: Payer: Self-pay | Admitting: Family Medicine

## 2022-03-04 DIAGNOSIS — R0982 Postnasal drip: Secondary | ICD-10-CM

## 2022-03-05 ENCOUNTER — Encounter (HOSPITAL_BASED_OUTPATIENT_CLINIC_OR_DEPARTMENT_OTHER): Payer: Self-pay | Admitting: Pulmonary Disease

## 2022-03-09 ENCOUNTER — Other Ambulatory Visit: Payer: Self-pay | Admitting: Cardiology

## 2022-03-09 DIAGNOSIS — E7800 Pure hypercholesterolemia, unspecified: Secondary | ICD-10-CM

## 2022-03-09 MED ORDER — TRELEGY ELLIPTA 100-62.5-25 MCG/ACT IN AEPB: 1.0000 | INHALATION_SPRAY | Freq: Every day | RESPIRATORY_TRACT | 5 refills | Status: DC

## 2022-03-15 ENCOUNTER — Telehealth: Payer: Self-pay | Admitting: Physical Medicine and Rehabilitation

## 2022-03-15 ENCOUNTER — Other Ambulatory Visit: Payer: Self-pay | Admitting: Physical Medicine and Rehabilitation

## 2022-03-15 ENCOUNTER — Ambulatory Visit
Admission: EM | Admit: 2022-03-15 | Discharge: 2022-03-15 | Disposition: A | Discharge status: Home / Self Care | Payer: Medicaid Other | Attending: Internal Medicine | Admitting: Internal Medicine

## 2022-03-15 ENCOUNTER — Other Ambulatory Visit: Payer: Self-pay | Admitting: Family Medicine

## 2022-03-15 DIAGNOSIS — R300 Dysuria: Secondary | ICD-10-CM | POA: Insufficient documentation

## 2022-03-15 DIAGNOSIS — F411 Generalized anxiety disorder: Secondary | ICD-10-CM

## 2022-03-15 LAB — POCT URINALYSIS DIP (MANUAL ENTRY)
Bilirubin, UA: NEGATIVE
Blood, UA: NEGATIVE
Glucose, UA: NEGATIVE mg/dL
Ketones, POC UA: NEGATIVE mg/dL
Nitrite, UA: NEGATIVE
Protein Ur, POC: NEGATIVE mg/dL
Spec Grav, UA: 1.025 (ref 1.010–1.025)
Urobilinogen, UA: 0.2 E.U./dL
pH, UA: 6.5 (ref 5.0–8.0)

## 2022-03-15 MED ORDER — DIAZEPAM 5 MG PO TABS: ORAL_TABLET | ORAL | 0 refills | Status: DC

## 2022-03-15 MED ORDER — PHENAZOPYRIDINE HCL 200 MG PO TABS: 200.0000 mg | ORAL_TABLET | Freq: Three times a day (TID) | ORAL | 0 refills | Status: AC | PRN

## 2022-03-15 NOTE — ED Triage Notes (Signed)
Pt c/o dysuria onset ~ 1 month ago

## 2022-03-15 NOTE — Telephone Encounter (Signed)
LVM to return call to clinic.

## 2022-03-15 NOTE — Telephone Encounter (Signed)
Pt called to reschedule appt. Please call pt at [phone]

## 2022-03-15 NOTE — Progress Notes (Signed)
Pre-procedure diazepam ordered for pre-operative anxiety.  

## 2022-03-15 NOTE — ED Provider Notes (Signed)
[name]    CSN: [id number] Arrival date & time: 03/15/22  1456      History   Chief Complaint Chief Complaint  Patient presents with   Dysuria    HPI Cheryl King is a 57 y.o. female.   Patient complains of discomfort with urination for the past month.  Patient reports burning when she urinates.  Patient denies any fever or chills.  She denies any abdominal pain she denies any flank pain.  Patient does not have any STD risk she denies any vaginal discharge denies any symptoms of yeast.  The history is provided by the patient. No language interpreter was used.  Dysuria Pain quality:  Aching Pain severity:  Moderate   Past Medical History:  Diagnosis Date   Acid reflux    Allergy    Carpal tunnel syndrome    COPD (chronic obstructive pulmonary disease) (HCC)    Depression    Diverticulitis    Hyperlipidemia    Low back pain    Neck pain    Sacroiliac inflammation ([place])    Sciatica    Seasonal allergies     Patient Active Problem List   Diagnosis Date Noted   Current mild episode of major depressive disorder without prior episode ([place]) 12/03/2021   Aortic atherosclerosis ([place]) 12/03/2021   Chronic respiratory failure with hypoxia ([place]) 03/11/2021   Centrilobular emphysema ([place]) 07/29/2020   Multiple nodules of lung 07/29/2020   Hyperlipidemia 07/18/2017   Ulnar neuropathy 04/18/2017   GERD (gastroesophageal reflux disease) 10/19/2016   Prediabetes 05/03/2016   Cubital tunnel syndrome on right 03/01/2016   Numbness and tingling of right arm 01/01/2016   Chlamydia infection 01/27/2015   Tobacco abuse 12/26/2013   Chronic lower back pain 04/13/2013   Sciatica 04/13/2013   Chronic neck pain 04/13/2013   Spondylosis of cervical spine 04/13/2013    Past Surgical History:  Procedure Laterality Date   ABDOMINAL SURGERY     ANKLE SURGERY     lt.   BREAST EXCISIONAL BIOPSY Right    pt states years ago- not sure when   PARTIAL  HYSTERECTOMY     right breast cyst removed      URETHRAL DIVERTICULUM REPAIR     uretrral diverticultis    OB History     Gravida  1   Para      Term      Preterm      AB  1   Living         SAB  1   IAB      Ectopic      Multiple      Live Births               Home Medications    Prior to Admission medications   Medication Sig Start Date End Date Taking? Authorizing Provider  phenazopyridine (PYRIDIUM) 200 MG tablet Take 1 tablet (200 mg total) by mouth 3 (three) times daily as needed for pain. 03/15/22 03/15/23 Yes Caryl Ada K, PA-C  acetaminophen-codeine (TYLENOL #3) 300-30 MG tablet Take 1-2 tablets by mouth every 6 (six) hours as needed.    [provider]  albuterol (VENTOLIN HFA) 108 (90 Base) MCG/ACT inhaler Inhale 2 puffs into the lungs every 6 (six) hours as needed for wheezing or shortness of breath. 07/03/21   Rigoberto Noel, MD  amitriptyline (ELAVIL) 50 MG tablet TAKE 1 TABLET (50 MG TOTAL) BY MOUTH AT BEDTIME. 12/03/21 12/03/22  Charlott Rakes, MD  atorvastatin (LIPITOR) 20 MG tablet TAKE 1 TABLET (20 MG TOTAL) BY MOUTH DAILY. 12/03/21 12/03/22  Charlott Rakes, MD  Azelastine HCl 137 MCG/SPRAY SOLN Place into both nostrils. 01/04/21   [provider]  cetirizine (ZYRTEC) 10 MG tablet Take 1 tablet (10 mg total) by mouth daily. 04/18/17   Charlott Rakes, MD  cyclobenzaprine (FLEXERIL) 10 MG tablet TAKE 1 TABLET (10 MG TOTAL) BY MOUTH 2 (TWO) TIMES DAILY AS NEEDED FOR MUSCLE SPASMS. 12/03/21 12/03/22  Charlott Rakes, MD  diazepam (VALIUM) 5 MG tablet Take 1 by mouth 1 hour  pre-procedure with very light food. May bring 2nd tablet to appointment. 03/15/22   Magnus Sinning, MD  diclofenac sodium (VOLTAREN) 1 % GEL Apply 2 g topically 4 (four) times daily. 03/18/17   Jessy Oto, MD  DULoxetine (CYMBALTA) 60 MG capsule TAKE 1 CAPSULE (60 MG TOTAL) BY MOUTH DAILY. 12/03/21 12/03/22  Charlott Rakes, MD  esomeprazole (NEXIUM) 40 MG  capsule Take 40 mg by mouth 2 (two) times daily. 04/26/21   [provider]  EYSUVIS 0.25 % SUSP Apply 1 drop to eye 4 (four) times daily. 06/02/21   [provider]  ezetimibe (ZETIA) 10 MG tablet TAKE 1 TABLET BY MOUTH DAILY AFTER SUPPER 03/09/22   Adrian Prows, MD  fluconazole (DIFLUCAN) 150 MG tablet Take 1 tablet (150 mg total) by mouth once a week. 02/18/21   Carvel Getting, NP  fluticasone (FLONASE) 50 MCG/ACT nasal spray SPRAY 1 SPRAY INTO BOTH NOSTRILS DAILY. 03/05/22   Charlott Rakes, MD  Fluticasone-Umeclidin-Vilant (TRELEGY ELLIPTA) 100-62.5-25 MCG/ACT AEPB Inhale 28 each into the lungs daily. 02/15/22   Rigoberto Noel, MD  Fluticasone-Umeclidin-Vilant (TRELEGY ELLIPTA) 100-62.5-25 MCG/ACT AEPB Inhale 1 puff into the lungs daily. 03/09/22   Rigoberto Noel, MD  Lactobacillus CAPS Take 1 capsule by mouth daily.    [provider]  lidocaine (LIDODERM) 5 % Place 1 patch onto the skin daily. Remove & Discard patch within 12 hours or as directed by MD 06/01/21   Charlott Rakes, MD  miconazole (MICATIN) 2 % cream Apply 1 application topically 2 (two) times daily. 02/18/21   Carvel Getting, NP  Misc. Devices MISC 1. Rolling walker with seat 2. Shower chair 3. Elevated toilet seat.   Diagnosis-chronic back pain 07/06/21   Charlott Rakes, MD  montelukast (SINGULAIR) 10 MG tablet Take 10 mg by mouth daily. 11/25/20   [provider]  Multiple Vitamin (MULTIVITAMIN WITH MINERALS) TABS tablet Take 1 tablet by mouth daily.    [provider]  nicotine (NICODERM CQ - DOSED IN MG/24 HOURS) 21 mg/24hr patch Place 1 patch (21 mg total) onto the skin daily. Then decrease to '14mg'$ /24 hr 06/01/21   Charlott Rakes, MD  oxybutynin (DITROPAN) 5 MG tablet TAKE 1 TABLET (5 MG TOTAL) BY MOUTH 2 (TWO) TIMES DAILY. 12/03/21 12/03/22  Charlott Rakes, MD  pregabalin (LYRICA) 75 MG capsule Take 1 capsule (75 mg total) by mouth 3 (three) times daily. 12/25/21 06/25/22  Charlott Rakes,  MD  RESTASIS 0.05 % ophthalmic emulsion  04/13/17   [provider]  triamcinolone cream (KENALOG) 0.1 % Apply 1 application topically 2 (two) times daily. 01/03/19   Charlott Rakes, MD  Varenicline Tartrate, Starter, 0.5 MG X 11 & 1 MG X 42 TBPK USE AS DIRECTED ON PACKAGE 01/28/22   Charlott Rakes, MD    Family History Family History  Problem Relation Age of Onset   Healthy Mother    Other Father  Unsure of medical history   Asthma Maternal Aunt    Diabetes Maternal Aunt    Cancer Maternal Aunt    Breast cancer Maternal Aunt    Hypertension Maternal Grandmother    Heart Problems Maternal Grandmother    Hypertension Other    COPD Other    Colon cancer Neg Hx    Pancreatic cancer Neg Hx    Rectal cancer Neg Hx    Stomach cancer Neg Hx    Colon polyps Neg Hx    Esophageal cancer Neg Hx     Social History Social History   Tobacco Use   Smoking status: Every Day    Packs/day: 0.25    Years: 35.00    Total pack years: 8.75    Types: Cigarettes    Start date: 22    Passive exposure: Past (uses patches, down to 3 cigarettes a day)   Smokeless tobacco: Never   Tobacco comments:    Pt states she smokes about 2 ciggs daily.  Vaping Use   Vaping Use: Never used  Substance Use Topics   Alcohol use: Not Currently    Comment: Social only - seldom   Drug use: No     Allergies   Percocet [oxycodone-acetaminophen], Vicodin [hydrocodone-acetaminophen], and Penicillins   Review of Systems Review of Systems  Genitourinary:  Positive for dysuria.  All other systems reviewed and are negative.    Physical Exam Triage Vital Signs ED Triage Vitals [03/15/22 1759]  Enc Vitals Group     BP 124/84     Pulse Rate 99     Resp 16     Temp 97.7 F (36.5 C)     Temp Source Oral     SpO2 97 %     Weight      Height      Head Circumference      Peak Flow      Pain Score 0     Pain Loc      Pain Edu?      Excl. in [place]?    No data found.  Updated Vital  Signs BP 124/84 (BP Location: Left Arm)   Pulse 99   Temp 97.7 F (36.5 C) (Oral)   Resp 16   SpO2 97%   Visual Acuity Right Eye Distance:   Left Eye Distance:   Bilateral Distance:    Right Eye Near:   Left Eye Near:    Bilateral Near:     Physical Exam Vitals and nursing note reviewed.  Constitutional:      Appearance: She is well-developed.  HENT:     Head: Normocephalic.  Cardiovascular:     Rate and Rhythm: Normal rate.  Pulmonary:     Effort: Pulmonary effort is normal.  Abdominal:     General: There is no distension.  Musculoskeletal:        General: Normal range of motion.     Cervical back: Normal range of motion.  Skin:    General: Skin is warm.  Neurological:     General: No focal deficit present.     Mental Status: She is alert and oriented to person, place, and time.      UC Treatments / Results  Labs (all labs ordered are listed, but only abnormal results are displayed) Labs Reviewed  POCT URINALYSIS DIP (MANUAL ENTRY) - Abnormal; Notable for the following components:      Result Value   Leukocytes, UA Trace (*)    All other  components within normal limits  URINE CULTURE    EKG   Radiology No results found.  Procedures Procedures (including critical care time)  Medications Ordered in UC Medications - No data to display  Initial Impression / Assessment and Plan / UC Course  I have reviewed the triage vital signs and the nursing notes.  Pertinent labs & imaging results that were available during my care of the patient were reviewed by me and considered in my medical decision making (see chart for details).     MDM: Patient has had a month-long history of urinary discomfort urinalysis is essentially negative will obtain a urine culture.  Patient is advised Pyridium for symptoms she is encouraged to drink more water.  Patient is advised to follow-up with her primary care physician if symptoms persist.  Will defer any antibiotic  treatment until culture results Final Clinical Impressions(s) / UC Diagnoses   Final diagnoses:  Dysuria     Discharge Instructions      Increase water intake.  Urinate more frequently.  Return if any problems.    ED Prescriptions     Medication Sig Dispense Auth. Provider   phenazopyridine (PYRIDIUM) 200 MG tablet Take 1 tablet (200 mg total) by mouth 3 (three) times daily as needed for pain. 20 tablet Fransico Meadow, [place]      PDMP not reviewed this encounter. An After Visit Summary was printed and given to the patient.       Fransico Meadow, [place] 03/15/22 2015

## 2022-03-15 NOTE — Discharge Instructions (Addendum)
Increase water intake.  Urinate more frequently.  Return if any problems.

## 2022-03-16 ENCOUNTER — Ambulatory Visit: Payer: Medicaid Other | Admitting: Physical Medicine and Rehabilitation

## 2022-03-16 NOTE — Telephone Encounter (Signed)
Requested medication (s) are due for refill today: yes  Requested medication (s) are on the active medication list: yes  Last refill:  01/28/22  Future visit scheduled: yes  Notes to clinic:  Unable to refill per protocol, cannot delegate.      Requested Prescriptions  Pending Prescriptions Disp Refills   Varenicline Tartrate, Starter, 0.5 MG X 11 & 1 MG X 42 TBPK [Pharmacy Med Name: VARENICLINE STARTING MONTH BOX] 53 each 0    Sig: USE AS DIRECTED ON PACKAGE     Psychiatry:  Drug Dependence Therapy - varenicline Failed - 03/15/2022 12:04 PM      Failed - Manual Review: Do not refill starter pack. 1 mg tabs may be extended up to one year if the patient has quit smoking but still feels at risk for relapse.      Passed - Cr in normal range and within 180 days    Creat  Date Value Ref Range Status  04/13/2013 0.74 0.50 - 1.10 mg/dL Final   Creatinine, Ser  Date Value Ref Range Status  12/28/2021 0.72 0.44 - 1.00 mg/dL Final         Passed - Completed PHQ-2 or PHQ-9 in the last 360 days      Passed - Valid encounter within last 6 months    Recent Outpatient Visits           1 month ago Chronic respiratory failure with hypoxia [facility])   [place] Charlott Rakes, MD   3 months ago Encounter for screening mammogram for malignant neoplasm of breast   [place], Enobong, MD   9 months ago Pedal edema   [place], Enobong, MD   1 year ago Need for shingles vaccine   [place], RPH-CPP   1 year ago Bilateral low back pain with bilateral sciatica, unspecified chronicity   [place], Enobong, MD       Future Appointments             In 2 days Callie Fielding, MD [place]   In 2 months Charlott Rakes, MD [place]

## 2022-03-17 LAB — URINE CULTURE
Culture: <10000 — AB
Special Requests: NORMAL

## 2022-03-17 MED ORDER — VARENICLINE TARTRATE 1 MG PO TABS: 1.0000 mg | ORAL_TABLET | Freq: Two times a day (BID) | ORAL | 2 refills | Status: DC

## 2022-03-18 ENCOUNTER — Ambulatory Visit (INDEPENDENT_AMBULATORY_CARE_PROVIDER_SITE_OTHER): Payer: Medicaid Other | Admitting: Orthopedic Surgery

## 2022-03-18 DIAGNOSIS — M7062 Trochanteric bursitis, left hip: Secondary | ICD-10-CM | POA: Diagnosis not present

## 2022-03-18 DIAGNOSIS — M7061 Trochanteric bursitis, right hip: Secondary | ICD-10-CM | POA: Diagnosis not present

## 2022-03-18 DIAGNOSIS — M5416 Radiculopathy, lumbar region: Secondary | ICD-10-CM | POA: Diagnosis not present

## 2022-03-18 DIAGNOSIS — M47816 Spondylosis without myelopathy or radiculopathy, lumbar region: Secondary | ICD-10-CM | POA: Diagnosis not present

## 2022-03-18 NOTE — Progress Notes (Signed)
Orthopedic Spine Surgery Office Note  Assessment: Patient is a 57 y.o. female with with chronic low back pain. Has pain radiating into her legs in several distributions but the worst is along the posterior aspect of the legs. Got about 65% improvement with L5/S1 transforaminal injection in the past. Has foraminal stenosis bilaterally at L5/S1.    Also has pain over her lateral hips and tenderness palpation over the trochanter.  Has history of trochanteric bursitis and has not gotten improvement with injections in the past  Plan: -Explained that initially conservative treatment is tried as a significant number of patients may experience relief with these treatment modalities. Discussed that the conservative treatments include:  -activity modification  -physical therapy  -over the counter pain medications  -medrol dosepak  -lumbar steroid injections -Patient has tried injections, activity modification, over-the-counter medications, physical therapy   -Patient has gotten relief with injections in the past and is interested in a repeat 1 while she works on quitting nicotine.  A referral was provided for L5/S1 injection -In regards to her trochanteric bursitis, she was interested in injections as she has done well with those in the past.  Those were done today in the office. -Patient should return to office in 5 weeks, x-rays at next visit: None   Patient expressed understanding of the plan and all questions were answered to the patient's satisfaction.   Bilateral trochanteric bursa injection note: Starting with the right hip, patient was in the lateral decubitus position.  The area over the greater trochanter was prepped with an alcohol based prep.  Ethyl chloride was used to anesthetize the area.  Then, a 20-gauge needle with 1 cc of Depo-Medrol, 1 cc of bupivacaine, 1 cc of lidocaine was around the greater trochanter into the area of the bursa under standard sterile technique.  The medication was  injected and the needle was then withdrawn.  A Band-Aid was applied.  The patient then changed position with the left hip up.  The same exact procedure was then performed on the left side.  Patient tolerated both procedures well. ___________________________________________________________________________  History: Patient is a 57 y.o. female who has been previously seen in the office for low back pain that radiates into her bilateral lower extremities.  It radiates throughout the lower extremities but the worst of his along the posterior aspect.  Her pain on a bad day is a 9 or 10 out of 10.  She feels that the majority of her pain at this time is in her legs.  She is having some back pain but is not as much as her bilateral leg pain.  She feels the pain every day.  Pain is slightly worse with activity but she also has it at rest.  She does get paresthesias along the posterior aspect of the thighs and legs in the same distribution as the worst pain.  No other numbness or paresthesias  Previous treatments:   injections, activity modification, over-the-counter medications, physical therapy    Physical Exam:  General: no acute distress, appears stated age Neurologic: alert, answering questions appropriately, following commands Respiratory: unlabored breathing on room air, symmetric chest rise Psychiatric: appropriate affect, normal cadence to speech   MSK (spine):  -Strength exam      Left  Right EHL    5/5  5/5 TA    5/5  5/5 GSC    5/5  5/5 Knee extension  5/5  5/5 Hip flexion   5/5  5/5  -Sensory exam  Sensation intact to light touch in L3-S1 nerve distributions of bilateral lower extremities  -Achilles DTR: 2/4 on the left, 2/4 on the right -Patellar tendon DTR: 2/4 on the left, 2/4 on the right  -Straight leg raise: Negative bilaterally -Femoral nerve stretch test: Negative bilaterally -Clonus: no beats bilaterally  -Bilateral hip exam: No pain through range of motion,  negative Stinchfield, negative FADIR, negative FABER, tender to palpation over the greater trochanter, no other tenderness palpation over the hips  Imaging: XR of the lumbar spine from 12/18/2021 and 08/27/2021 was previously independently reviewed and interpreted, showing disc height loss at L5/S1 and L2/3. Vacuum disc phenomenon at those levels. No fracture or dislocation. No evidence of instability on flexion/extension.    MRI of the lumbar spine from 09/27/2020 was independently reviewed and previously interpreted, showing DDD at multiple levels in the lumbar spine. Foraminal stenosis bilaterally at L5/S1.   Patient name: Cheryl King Patient MRN: [id number] Date of visit: 03/18/22

## 2022-03-24 ENCOUNTER — Other Ambulatory Visit: Payer: Self-pay | Admitting: Orthopedic Surgery

## 2022-03-30 ENCOUNTER — Encounter (HOSPITAL_BASED_OUTPATIENT_CLINIC_OR_DEPARTMENT_OTHER): Payer: Self-pay | Admitting: Pulmonary Disease

## 2022-03-30 MED ORDER — PREDNISONE 10 MG PO TABS: ORAL_TABLET | ORAL | 0 refills | Status: AC

## 2022-03-30 MED ORDER — AZITHROMYCIN 250 MG PO TABS: ORAL_TABLET | ORAL | 0 refills | Status: DC

## 2022-03-30 NOTE — Telephone Encounter (Signed)
Dr. [name], please see mychart messages sent by pt and advise.

## 2022-04-10 ENCOUNTER — Other Ambulatory Visit: Payer: Self-pay | Admitting: Family Medicine

## 2022-04-12 NOTE — Telephone Encounter (Signed)
Rx 03/17/22 #60 2RF- too soon Requested Prescriptions  Pending Prescriptions Disp Refills   Varenicline Tartrate, Starter, 0.5 MG X 11 & 1 MG X 42 TBPK [Pharmacy Med Name: VARENICLINE STARTING MONTH BOX] 53 each 0    Sig: USE AS DIRECTED ON PACKAGE     Psychiatry:  Drug Dependence Therapy - varenicline Failed - 04/10/2022  6:04 PM      Failed - Manual Review: Do not refill starter pack. 1 mg tabs may be extended up to one year if the patient has quit smoking but still feels at risk for relapse.      Passed - Cr in normal range and within 180 days    Creat  Date Value Ref Range Status  04/13/2013 0.74 0.50 - 1.10 mg/dL Final   Creatinine, Ser  Date Value Ref Range Status  12/28/2021 0.72 0.44 - 1.00 mg/dL Final         Passed - Completed PHQ-2 or PHQ-9 in the last 360 days      Passed - Valid encounter within last 6 months    Recent Outpatient Visits           2 months ago Chronic respiratory failure with hypoxia [facility])   [place] Charlott Rakes, MD   4 months ago Encounter for screening mammogram for malignant neoplasm of breast   [place], Enobong, MD   10 months ago Pedal edema   [place], Enobong, MD   1 year ago Need for shingles vaccine   [place], RPH-CPP   1 year ago Bilateral low back pain with bilateral sciatica, unspecified chronicity   [place], Enobong, MD       Future Appointments             In 1 week Laurance Flatten Venia Carbon, MD [place]   In 1 month Charlott Rakes, MD [place]

## 2022-04-21 ENCOUNTER — Telehealth: Payer: Self-pay | Admitting: Orthopedic Surgery

## 2022-04-21 NOTE — Telephone Encounter (Signed)
Patient states she is scheduled to have a follow up tomorrow from an appointment with Dr. Ernestina Patches, but patient states she has not seen dr. Ernestina Patches, and she wants to know if she still needs to come to appointment?

## 2022-04-22 ENCOUNTER — Ambulatory Visit: Payer: Medicaid Other | Admitting: Orthopedic Surgery

## 2022-04-22 NOTE — Telephone Encounter (Signed)
I called and advised to cancel appt till she has the injection with Dr. Ernestina Patches

## 2022-04-24 ENCOUNTER — Other Ambulatory Visit: Payer: Self-pay | Admitting: Orthopedic Surgery

## 2022-04-27 ENCOUNTER — Encounter: Payer: Self-pay | Admitting: *Deleted

## 2022-04-29 ENCOUNTER — Ambulatory Visit (INDEPENDENT_AMBULATORY_CARE_PROVIDER_SITE_OTHER): Payer: Medicaid Other | Admitting: Physical Medicine and Rehabilitation

## 2022-04-29 ENCOUNTER — Other Ambulatory Visit: Payer: Self-pay

## 2022-04-29 VITALS — BP 113/73 | HR 86

## 2022-04-29 DIAGNOSIS — M5416 Radiculopathy, lumbar region: Secondary | ICD-10-CM

## 2022-04-29 MED ORDER — METHYLPREDNISOLONE ACETATE 80 MG/ML IJ SUSP
80.0000 mg | Freq: Once | INTRAMUSCULAR | Status: AC
  Administered 2022-04-29: 80 mg

## 2022-04-29 NOTE — Progress Notes (Signed)
Functional Pain Scale - descriptive words and definitions  Unmanageable (7)  Pain interferes with normal ADL's/nothing seems to help/sleep is very difficult/active distractions are very difficult to concentrate on. Severe range order  Average Pain  varies   +Driver, -BT, -Dye Allergies.  Lower back pain on both sides that radiates into both legs

## 2022-04-29 NOTE — Patient Instructions (Signed)

## 2022-05-06 ENCOUNTER — Ambulatory Visit: Payer: Medicaid Other | Admitting: Adult Health

## 2022-05-06 ENCOUNTER — Encounter: Payer: Self-pay | Admitting: Adult Health

## 2022-05-06 VITALS — BP 128/66 | HR 71 | Wt 173.6 lb

## 2022-05-06 DIAGNOSIS — J432 Centrilobular emphysema: Secondary | ICD-10-CM | POA: Diagnosis not present

## 2022-05-06 DIAGNOSIS — F17210 Nicotine dependence, cigarettes, uncomplicated: Secondary | ICD-10-CM | POA: Diagnosis not present

## 2022-05-06 DIAGNOSIS — J9611 Chronic respiratory failure with hypoxia: Secondary | ICD-10-CM | POA: Diagnosis not present

## 2022-05-06 DIAGNOSIS — R918 Other nonspecific abnormal finding of lung field: Secondary | ICD-10-CM

## 2022-05-06 DIAGNOSIS — R1310 Dysphagia, unspecified: Secondary | ICD-10-CM

## 2022-05-06 DIAGNOSIS — K219 Gastro-esophageal reflux disease without esophagitis: Secondary | ICD-10-CM

## 2022-05-06 DIAGNOSIS — Z720 Tobacco use: Secondary | ICD-10-CM

## 2022-05-06 MED ORDER — TRELEGY ELLIPTA 100-62.5-25 MCG/ACT IN AEPB: 1.0000 | INHALATION_SPRAY | Freq: Every day | RESPIRATORY_TRACT | 5 refills | Status: DC

## 2022-05-06 NOTE — Assessment & Plan Note (Signed)
CT chest July 2023 showed stable nodules.  Reenroll in the lung cancer CT screening program

## 2022-05-06 NOTE — Assessment & Plan Note (Signed)
History of GERD.  Continue on Nexium twice daily.  Referral to GI for dysphagia and episodes of choking.

## 2022-05-06 NOTE — Progress Notes (Signed)
  ID: Cheryl King, female    DOB: [birth year], 57 y.o.   MRN: [id number]  Chief Complaint  Patient presents with   Follow-up    Referring provider: Hoy Register, MD  HPI: 57 year old female active smoker followed for severe COPD with emphysema and chronic respiratory failure and lung nodule   TEST/EVENTS :  6-minute walk test that was completed on March 03, 2021. This showed that patient desatted down to 84% on room air. - started on O2 02/2021   PFTs 02/2021 >> severe airway obstruction, ratio 52, FEV1 44%, FVC 67%, DLCO 33%     07/2021 CT chest >> stable 5mm RLL nodule 08/2020 CT chest >> 5mm nodule RLL unchanged since 06/2019   05/06/2022 Follow up : COPD with emphysema and chronic respiratory failure on oxygen Patient returns for a 66-month follow-up.  Patient has underlying severe COPD with emphysema.  Last visit she was changed from Symbicort and Spiriva to Trelegy which she feels like it is working well for her.  She would like a refill of this. Patient says overall breathing has been doing well.  She denies any flare of cough or wheezing.  She remains on oxygen 4 L with activity and at bedtime.  Does have a POC device.  Is doing well on this.  Previous CT chest July 2023 showed stable lung nodule in the right lower lobe measuring 5 mm.  She previously participated in the lung cancer CT screening program.  We have placed a referral to reenrolled.  We discussed smoking cessation.  She has cut back on her cigarettes recently.  Patient does complain over the last month that she has had some episodes where she has had choking with eating and feeling like her swallowing was not correct.  She has been seen by GI at [facility].  She denies any vomiting, bloody stools, weight loss, abdominal pain.  Does take Nexium twice daily  Allergies  Allergen Reactions   Percocet [Oxycodone-Acetaminophen] Nausea Only   Vicodin [Hydrocodone-Acetaminophen] Nausea Only    Penicillins Rash    Immunization History  Administered Date(s) Administered   Fluad Quad(high Dose 65+) 10/14/2020   Influenza,inj,Quad PF,6+ Mos 10/09/2013, 10/03/2015, 10/18/2017, 09/20/2018, 10/23/2019   Influenza-Unspecified 10/19/2016, 11/02/2021   PFIZER Comirnaty(Gray Top)Covid-19 Tri-Sucrose Vaccine 10/23/2020   PFIZER(Purple Top)SARS-COV-2 Vaccination 11/13/2020   PNEUMOCOCCAL CONJUGATE-20 10/23/2020   Tdap 08/22/2015   Zoster Recombinat (Shingrix) 12/01/2020, 02/02/2021    Past Medical History:  Diagnosis Date   Acid reflux    Allergy    Carpal tunnel syndrome    COPD (chronic obstructive pulmonary disease)    Depression    Diverticulitis    Hyperlipidemia    Low back pain    Neck pain    Sacroiliac inflammation    Sciatica    Seasonal allergies     Tobacco History: Social History   Tobacco Use  Smoking Status Every Day   Packs/day: 0.25   Years: 35.00   Additional pack years: 0.00   Total pack years: 8.75   Types: Cigarettes   Start date: 32   Passive exposure: Past (uses patches, down to 3 cigarettes a day)  Smokeless Tobacco Never  Tobacco Comments   Pt states she smokes about 2 ciggs daily.   Ready to quit: Not Answered Counseling given: Not Answered Tobacco comments: Pt states she smokes about 2 ciggs daily.   Outpatient Medications Prior to Visit  Medication Sig Dispense Refill   acetaminophen-codeine (TYLENOL #3) 300-30 MG tablet TAKE 1  TO 2 TABLETS BY MOUTH EVERY 6 HOURS AS  NEEDED  FOR  PAIN  FOR  UP  TO  5  DAYS 30 tablet 0   albuterol (VENTOLIN HFA) 108 (90 Base) MCG/ACT inhaler Inhale 2 puffs into the lungs every 6 (six) hours as needed for wheezing or shortness of breath. 18 g 5   amitriptyline (ELAVIL) 50 MG tablet TAKE 1 TABLET (50 MG TOTAL) BY MOUTH AT BEDTIME. 90 tablet 1   atorvastatin (LIPITOR) 20 MG tablet TAKE 1 TABLET (20 MG TOTAL) BY MOUTH DAILY. 90 tablet 1   Azelastine HCl 137 MCG/SPRAY SOLN Place into both nostrils.      cetirizine (ZYRTEC) 10 MG tablet Take 1 tablet (10 mg total) by mouth daily. 90 tablet 1   cyclobenzaprine (FLEXERIL) 10 MG tablet TAKE 1 TABLET (10 MG TOTAL) BY MOUTH 2 (TWO) TIMES DAILY AS NEEDED FOR MUSCLE SPASMS. 180 tablet 1   diazepam (VALIUM) 5 MG tablet Take 1 by mouth 1 hour  pre-procedure with very light food. May bring 2nd tablet to appointment. 2 tablet 0   diclofenac sodium (VOLTAREN) 1 % GEL Apply 2 g topically 4 (four) times daily. 5 Tube 2   DULoxetine (CYMBALTA) 60 MG capsule TAKE 1 CAPSULE (60 MG TOTAL) BY MOUTH DAILY. 90 capsule 1   esomeprazole (NEXIUM) 40 MG capsule Take 40 mg by mouth 2 (two) times daily.     EYSUVIS 0.25 % SUSP Apply 1 drop to eye 4 (four) times daily.     ezetimibe (ZETIA) 10 MG tablet TAKE 1 TABLET BY MOUTH DAILY AFTER SUPPER 90 tablet 1   fluconazole (DIFLUCAN) 150 MG tablet Take 1 tablet (150 mg total) by mouth once a week. 2 tablet 0   fluticasone (FLONASE) 50 MCG/ACT nasal spray SPRAY 1 SPRAY INTO BOTH NOSTRILS DAILY. 16 mL 1   Fluticasone-Umeclidin-Vilant (TRELEGY ELLIPTA) 100-62.5-25 MCG/ACT AEPB Inhale 28 each into the lungs daily. 28 each 0   Lactobacillus CAPS Take 1 capsule by mouth daily.     lidocaine (LIDODERM) 5 % Place 1 patch onto the skin daily. Remove & Discard patch within 12 hours or as directed by MD 30 patch 3   miconazole (MICATIN) 2 % cream Apply 1 application topically 2 (two) times daily. 28.35 g 0   Misc. Devices MISC 1. Rolling walker with seat 2. Shower chair 3. Elevated toilet seat.   Diagnosis-chronic back pain 1 each 0   montelukast (SINGULAIR) 10 MG tablet Take 10 mg by mouth daily.     Multiple Vitamin (MULTIVITAMIN WITH MINERALS) TABS tablet Take 1 tablet by mouth daily.     nicotine (NICODERM CQ - DOSED IN MG/24 HOURS) 21 mg/24hr patch Place 1 patch (21 mg total) onto the skin daily. Then decrease to 14mg /24 hr 28 patch 2   oxybutynin (DITROPAN) 5 MG tablet TAKE 1 TABLET (5 MG TOTAL) BY MOUTH 2 (TWO) TIMES DAILY. 180  tablet 1   phenazopyridine (PYRIDIUM) 200 MG tablet Take 1 tablet (200 mg total) by mouth 3 (three) times daily as needed for pain. 20 tablet 0   pregabalin (LYRICA) 75 MG capsule Take 1 capsule (75 mg total) by mouth 3 (three) times daily. 90 capsule 5   RESTASIS 0.05 % ophthalmic emulsion   3   triamcinolone cream (KENALOG) 0.1 % Apply 1 application topically 2 (two) times daily. 45 g 1   varenicline (CHANTIX) 1 MG tablet Take 1 tablet (1 mg total) by mouth 2 (two) times daily. 60 tablet  2   azithromycin (ZITHROMAX) 250 MG tablet Take two today and then one daily until finished. 6 tablet 0   Fluticasone-Umeclidin-Vilant (TRELEGY ELLIPTA) 100-62.5-25 MCG/ACT AEPB Inhale 1 puff into the lungs daily. 60 each 5   Facility-Administered Medications Prior to Visit  Medication Dose Route Frequency Provider Last Rate Last Admin   0.9 %  sodium chloride infusion  500 mL Intravenous Once Charlie Pitter III, MD       methylPREDNISolone acetate (DEPO-MEDROL) injection 80 mg  80 mg Other Once Tyrell Antonio, MD         Review of Systems:   Constitutional:   No  weight loss, night sweats,  Fevers, chills,  +fatigue, or  lassitude.  HEENT:   No headaches,  Difficulty swallowing,  Tooth/dental problems, or  Sore throat,                No sneezing, itching, ear ache, nasal congestion, post nasal drip,   CV:  No chest pain,  Orthopnea, PND, swelling in lower extremities, anasarca, dizziness, palpitations, syncope.   GI  No heartburn, indigestion, abdominal pain, nausea, vomiting, diarrhea, change in bowel habits, loss of appetite, bloody stools. +dysphagia   Resp:   No excess mucus, no productive cough,  No non-productive cough,  No coughing up of blood.  No change in color of mucus.  No wheezing.  No chest wall deformity  Skin: no rash or lesions.  GU: no dysuria, change in color of urine, no urgency or frequency.  No flank pain, no hematuria   MS:  No joint pain or swelling.  No decreased range  of motion.  No back pain.    Physical Exam  BP 128/66 (BP Location: Left Arm, Patient Position: Sitting, Cuff Size: Normal)   Pulse 71   Wt 173 lb 9.6 oz (78.7 kg)   SpO2 96% Comment: on room air she turned off her POC while in room  BMI 29.34 kg/m   GEN: A/Ox3; pleasant , NAD, well nourished    HEENT:  [place]/AT,  NOSE-clear, THROAT-clear, no lesions, no postnasal drip or exudate noted.   NECK:  Supple w/ fair ROM; no JVD; normal carotid impulses w/o bruits; no thyromegaly or nodules palpated; no lymphadenopathy.    RESP  Clear  P & A; w/o, wheezes/ rales/ or rhonchi. no accessory muscle use, no dullness to percussion  CARD:  RRR, no m/r/g, no peripheral edema, pulses intact, no cyanosis or clubbing.  GI:   Soft & nt; nml bowel sounds; no organomegaly or masses detected.   Musco: Warm bil, no deformities or joint swelling noted.   Neuro: alert, no focal deficits noted.    Skin: Warm, no lesions or rashes    Lab Results:    BMET   BNP   ProBNP No results found for: "PROBNP"  Imaging: XR C-ARM NO REPORT  Result Date: 04/29/2022 Please see Notes tab for imaging impression.        Latest Ref Rng & Units 09/29/2020    9:53 AM  PFT Results  FVC-Pre L 1.93   FVC-Predicted Pre % 67   FVC-Post L 2.10   FVC-Predicted Post % 73   Pre FEV1/FVC % % 52   Post FEV1/FCV % % 54   FEV1-Pre L 1.01   FEV1-Predicted Pre % 44   FEV1-Post L 1.14   DLCO uncorrected ml/min/mmHg 7.02   DLCO UNC% % 33   DLCO corrected ml/min/mmHg 7.02   DLCO COR %Predicted % 33  DLVA Predicted % 40   TLC L 5.32   TLC % Predicted % 102   RV % Predicted % 155     No results found for: "NITRICOXIDE"      Assessment & Plan:   Centrilobular emphysema (HCC) COPD with emphysema currently stable on present regimen.  Will continue on Trelegy inhaler. Continue with lung cancer CT screening program.  Plan ' Patient Instructions  Continue on Trelegy 1 puff daily, rinse after use.   Activity as tolerated  Wear Oxygen 4lm with activity and At bedtime   Work on not smoking .  Re-Enroll in Lung cancer CT chest screening CT -Due in July 2024  Refer to GI for Dysphagia [facility] , Dr. Gwendalyn Ege) [phone] Continue on Nexium  Twice daily .  Follow up with Dr. Vassie Loll in 4 months and As needed   Please contact office for sooner follow up if symptoms do not improve or worsen or seek emergency care       Chronic respiratory failure with hypoxia (HCC) Continue on oxygen to maintain O2 saturations greater than 88 to 9%  Multiple nodules of lung CT chest July 2023 showed stable nodules.  Reenroll in the lung cancer CT screening program  GERD (gastroesophageal reflux disease) History of GERD.  Continue on Nexium twice daily.  Referral to GI for dysphagia and episodes of choking.  Tobacco abuse Smoking cessation discussed     Rubye Oaks, NP 05/06/2022

## 2022-05-06 NOTE — Assessment & Plan Note (Signed)
Smoking cessation discussed 

## 2022-05-06 NOTE — Assessment & Plan Note (Signed)
Continue on oxygen to maintain O2 saturations greater than 88 to 9% 

## 2022-05-06 NOTE — Patient Instructions (Addendum)
Continue on Trelegy 1 puff daily, rinse after use.  Activity as tolerated  Wear Oxygen 4lm with activity and At bedtime   Work on not smoking .  Re-Enroll in Lung cancer CT chest screening CT -Due in July 2024  Refer to GI for Dysphagia [facility] , Dr. Gwendalyn Ege) [phone] Continue on Nexium  Twice daily .  Follow up with Dr. Vassie Loll in 4 months and As needed   Please contact office for sooner follow up if symptoms do not improve or worsen or seek emergency care

## 2022-05-06 NOTE — Assessment & Plan Note (Signed)
COPD with emphysema currently stable on present regimen.  Will continue on Trelegy inhaler. Continue with lung cancer CT screening program.  Plan ' Patient Instructions  Continue on Trelegy 1 puff daily, rinse after use.  Activity as tolerated  Wear Oxygen 4lm with activity and At bedtime   Work on not smoking .  Re-Enroll in Lung cancer CT chest screening CT -Due in July 2024  Refer to GI for Dysphagia [facility] , Dr. Gwendalyn Ege) [phone] Continue on Nexium  Twice daily .  Follow up with Dr. Vassie Loll in 4 months and As needed   Please contact office for sooner follow up if symptoms do not improve or worsen or seek emergency care

## 2022-05-14 NOTE — Procedures (Signed)
Lumbosacral Transforaminal Epidural Steroid Injection - Sub-Pedicular Approach with Fluoroscopic Guidance  Patient: Cheryl King      Date of Birth: [birth year] MRN: [id number] PCP: Hoy Register, MD      Visit Date: 04/29/2022   Universal Protocol:    Date/Time: 04/29/2022  Consent Given By: the patient  Position: PRONE  Additional Comments: Vital signs were monitored before and after the procedure. Patient was prepped and draped in the usual sterile fashion. The correct patient, procedure, and site was verified.   Injection Procedure Details:   Procedure diagnoses: Lumbar radiculopathy [M54.16]    Meds Administered:  Meds ordered this encounter  Medications   methylPREDNISolone acetate (DEPO-MEDROL) injection 80 mg    Laterality: Bilateral  Location/Site: L5  Needle:5.0 in., 22 ga.  Short bevel or Quincke spinal needle  Needle Placement: Transforaminal  Findings:    -Comments: Excellent flow of contrast along the nerve, nerve root and into the epidural space.  Procedure Details: After squaring off the end-plates to get a true AP view, the C-arm was positioned so that an oblique view of the foramen as noted above was visualized. The target area is just inferior to the "nose of the scotty dog" or sub pedicular. The soft tissues overlying this structure were infiltrated with 2-3 ml. of 1% Lidocaine without Epinephrine.  The spinal needle was inserted toward the target using a "trajectory" view along the fluoroscope beam.  Under AP and lateral visualization, the needle was advanced so it did not puncture dura and was located close the 6 O'Clock position of the pedical in AP tracterory. Biplanar projections were used to confirm position. Aspiration was confirmed to be negative for CSF and/or blood. A 1-2 ml. volume of Isovue-250 was injected and flow of contrast was noted at each level. Radiographs were obtained for documentation purposes.   After attaining the  desired flow of contrast documented above, a 0.5 to 1.0 ml test dose of 0.25% Marcaine was injected into each respective transforaminal space.  The patient was observed for 90 seconds post injection.  After no sensory deficits were reported, and normal lower extremity motor function was noted,   the above injectate was administered so that equal amounts of the injectate were placed at each foramen (level) into the transforaminal epidural space.   Additional Comments:  No complications occurred Dressing: 2 x 2 sterile gauze and Band-Aid    Post-procedure details: Patient was observed during the procedure. Post-procedure instructions were reviewed.  Patient left the clinic in stable condition.

## 2022-05-14 NOTE — Progress Notes (Signed)
Cheryl King - 57 y.o. female MRN [id number]  Date of birth: 04/25/1965  Office Visit Note: Visit Date: 04/29/2022 PCP: Hoy Register, MD Referred by: London Sheer, MD  Subjective: Chief Complaint  Patient presents with   Lower Back - Pain   HPI:  Cheryl King is a 57 y.o. female who comes in today at the request of Dr. Willia Craze for planned Bilateral L5-S1 Lumbar Transforaminal epidural steroid injection with fluoroscopic guidance.  The patient has failed conservative care including home exercise, medications, time and activity modification.  This injection will be diagnostic and hopefully therapeutic.  Please see requesting physician notes for further details and justification.   ROS Otherwise per HPI.  Assessment & Plan: Visit Diagnoses:    ICD-10-CM   1. Lumbar radiculopathy  M54.16 XR C-ARM NO REPORT    Epidural Steroid injection    methylPREDNISolone acetate (DEPO-MEDROL) injection 80 mg      Plan: No additional findings.   Meds & Orders:  Meds ordered this encounter  Medications   methylPREDNISolone acetate (DEPO-MEDROL) injection 80 mg    Orders Placed This Encounter  Procedures   XR C-ARM NO REPORT   Epidural Steroid injection    Follow-up: Return for visit to requesting provider as needed.   Procedures: No procedures performed  Lumbosacral Transforaminal Epidural Steroid Injection - Sub-Pedicular Approach with Fluoroscopic Guidance  Patient: Cheryl King      Date of Birth: 08-Feb-1965 MRN: [id number] PCP: Hoy Register, MD      Visit Date: 04/29/2022   Universal Protocol:    Date/Time: 04/29/2022  Consent Given By: the patient  Position: PRONE  Additional Comments: Vital signs were monitored before and after the procedure. Patient was prepped and draped in the usual sterile fashion. The correct patient, procedure, and site was verified.   Injection Procedure Details:   Procedure diagnoses: Lumbar radiculopathy  [M54.16]    Meds Administered:  Meds ordered this encounter  Medications   methylPREDNISolone acetate (DEPO-MEDROL) injection 80 mg    Laterality: Bilateral  Location/Site: L5  Needle:5.0 in., 22 ga.  Short bevel or Quincke spinal needle  Needle Placement: Transforaminal  Findings:    -Comments: Excellent flow of contrast along the nerve, nerve root and into the epidural space.  Procedure Details: After squaring off the end-plates to get a true AP view, the C-arm was positioned so that an oblique view of the foramen as noted above was visualized. The target area is just inferior to the "nose of the scotty dog" or sub pedicular. The soft tissues overlying this structure were infiltrated with 2-3 ml. of 1% Lidocaine without Epinephrine.  The spinal needle was inserted toward the target using a "trajectory" view along the fluoroscope beam.  Under AP and lateral visualization, the needle was advanced so it did not puncture dura and was located close the 6 O'Clock position of the pedical in AP tracterory. Biplanar projections were used to confirm position. Aspiration was confirmed to be negative for CSF and/or blood. A 1-2 ml. volume of Isovue-250 was injected and flow of contrast was noted at each level. Radiographs were obtained for documentation purposes.   After attaining the desired flow of contrast documented above, a 0.5 to 1.0 ml test dose of 0.25% Marcaine was injected into each respective transforaminal space.  The patient was observed for 90 seconds post injection.  After no sensory deficits were reported, and normal lower extremity motor function was noted,   the above injectate was administered so that equal  amounts of the injectate were placed at each foramen (level) into the transforaminal epidural space.   Additional Comments:  No complications occurred Dressing: 2 x 2 sterile gauze and Band-Aid    Post-procedure details: Patient was observed during the  procedure. Post-procedure instructions were reviewed.  Patient left the clinic in stable condition.    Clinical History: EXAM: MRI LUMBAR SPINE WITHOUT AND WITH CONTRAST   TECHNIQUE: Multiplanar and multiecho pulse sequences of the lumbar spine were obtained without and with intravenous contrast.   CONTRAST:  15mL MULTIHANCE GADOBENATE DIMEGLUMINE 529 MG/ML IV SOLN   COMPARISON:  01/05/2017   FINDINGS: Segmentation:  5 lumbar type vertebrae   Alignment:  Mild scoliosis.  No listhesis   Vertebrae: No fracture, evidence of discitis, or bone lesion. Endplate sclerosis has developed at L2-3 and L5-S1, degenerative appearing.   Conus medullaris and cauda equina: Conus extends to the L1 level. Conus and cauda equina appear normal.   Paraspinal and other soft tissues: No perispinal mass or inflammation noted.   Disc levels:   T11-12: Chronic disc collapse with ventral protrusion.   T12- L1: Unremarkable.   L1-L2: Disc narrowing and bulging with endplate degeneration. Patent canal and foramina   L2-L3: Disc narrowing and bulging with endplate degeneration. Circumferential disc bulging and facet spurring. Mild or moderate left foraminal narrowing   L3-L4: Disc narrowing and bulging with facet spurring. Patent canal and foramina   L4-L5: Disc narrowing and bulging. Degenerative facet spurring greater on the right. Mild-to-moderate right foraminal narrowing   L5-S1:Disc collapse and endplate degeneration with bulge. Degenerative facet spurring on the right more than left. Biforaminal impingement, worse on the right and progressed.   IMPRESSION: 1. Generalized lumbar spine degeneration with progression from 2018, especially at L5-S1 where there is right more than left foraminal impingement. 2. Diffusely patent spinal canal. 3. Mild scoliosis.     Electronically Signed   By: Tiburcio Pea M.D.   On: 09/30/2020 09:57     Objective:  VS:  HT:    WT:   BMI:      BP:113/73  HR:86bpm  TEMP: ( )  RESP:  Physical Exam Vitals and nursing note reviewed.  Constitutional:      General: She is not in acute distress.    Appearance: Normal appearance. She is not ill-appearing.  HENT:     Head: Normocephalic and atraumatic.     Right Ear: External ear normal.     Left Ear: External ear normal.  Eyes:     Extraocular Movements: Extraocular movements intact.  Cardiovascular:     Rate and Rhythm: Normal rate.     Pulses: Normal pulses.  Pulmonary:     Effort: Pulmonary effort is normal. No respiratory distress.  Abdominal:     General: There is no distension.     Palpations: Abdomen is soft.  Musculoskeletal:        General: Tenderness present.     Cervical back: Neck supple.     Right lower leg: No edema.     Left lower leg: No edema.     Comments: Patient has good distal strength with no pain over the greater trochanters.  No clonus or focal weakness.  Skin:    Findings: No erythema, lesion or rash.  Neurological:     General: No focal deficit present.     Mental Status: She is alert and oriented to person, place, and time.     Sensory: No sensory deficit.  Motor: No weakness or abnormal muscle tone.     Coordination: Coordination normal.  Psychiatric:        Mood and Affect: Mood normal.        Behavior: Behavior normal.      Imaging: No results found.

## 2022-05-24 ENCOUNTER — Ambulatory Visit: Payer: Medicaid Other | Admitting: Podiatry

## 2022-05-24 ENCOUNTER — Ambulatory Visit (INDEPENDENT_AMBULATORY_CARE_PROVIDER_SITE_OTHER): Payer: Medicaid Other

## 2022-05-24 ENCOUNTER — Encounter: Payer: Self-pay | Admitting: Podiatry

## 2022-05-24 DIAGNOSIS — L600 Ingrowing nail: Secondary | ICD-10-CM | POA: Diagnosis not present

## 2022-05-24 DIAGNOSIS — R52 Pain, unspecified: Secondary | ICD-10-CM

## 2022-05-24 DIAGNOSIS — M7752 Other enthesopathy of left foot: Secondary | ICD-10-CM

## 2022-05-24 NOTE — Progress Notes (Signed)
Subjective:   Patient ID: Cheryl King, female   DOB: 57 y.o.   MRN: [id number]   HPI Patient presents with mild diffuse pain across the forefoot left over right with a significant incurvation thickness and pain in the left second nail that is painful when she presses down on it and has been sore for a number of months with lesions bilateral.  Patient smokes small amounts but does have COPD and emphysema   Review of Systems  All other systems reviewed and are negative.       Objective:  Physical Exam Vitals and nursing note reviewed.  Constitutional:      Appearance: She is well-developed.  Pulmonary:     Effort: Pulmonary effort is normal.  Musculoskeletal:        General: Normal range of motion.  Skin:    General: Skin is warm.  Neurological:     Mental Status: She is alert.     Neurovascular status intact muscle strength was found to be adequate range of motion adequate with patient found to have significant thickness dystrophic changes of the second digit left foot that is painful when pressed make shoe gear difficult also has some small lesions plantar both feet and mild discomfort into the forefoot left     Assessment:  Damage left second nail painful chronic in nature possibility for forefoot inflammation or bony injury and lesion formation     Plan:  H&P reviewed all conditions discussing each 1 individually.  For the nail I recommended removal she wants to get this done and I went ahead today I anesthetized the left second toe 60 mg like Marcaine mixture patient read signed consent form understanding risk and permanent removal I then went ahead and remove the nail exposed matrix applied phenol 3 applications 30 seconds followed by alcohol lavage sterile dressing gave instructions on soaks leave dressing on 24 hours take it off earlier if throbbing were to occur and encouraged her to call with questions.  Debrided lesions courtesy discussed forefoot pain left and  will wear rigid bottom shoes if symptoms persist may require other treatment  X-rays were negative for signs of fracture left forefoot indicate spur formation moderate depression of the arch left

## 2022-05-24 NOTE — Patient Instructions (Signed)

## 2022-05-28 ENCOUNTER — Other Ambulatory Visit: Payer: Self-pay

## 2022-05-28 DIAGNOSIS — Z87891 Personal history of nicotine dependence: Secondary | ICD-10-CM

## 2022-05-28 DIAGNOSIS — F17210 Nicotine dependence, cigarettes, uncomplicated: Secondary | ICD-10-CM

## 2022-06-03 ENCOUNTER — Telehealth: Payer: Self-pay | Admitting: Family Medicine

## 2022-06-03 ENCOUNTER — Encounter: Payer: Self-pay | Admitting: Family Medicine

## 2022-06-03 ENCOUNTER — Ambulatory Visit: Payer: Medicaid Other | Attending: Family Medicine | Admitting: Family Medicine

## 2022-06-03 VITALS — BP 107/70 | HR 77 | Ht 65.0 in | Wt 174.8 lb

## 2022-06-03 DIAGNOSIS — N3000 Acute cystitis without hematuria: Secondary | ICD-10-CM | POA: Diagnosis not present

## 2022-06-03 DIAGNOSIS — G44229 Chronic tension-type headache, not intractable: Secondary | ICD-10-CM

## 2022-06-03 DIAGNOSIS — M5442 Lumbago with sciatica, left side: Secondary | ICD-10-CM

## 2022-06-03 DIAGNOSIS — E7800 Pure hypercholesterolemia, unspecified: Secondary | ICD-10-CM | POA: Diagnosis not present

## 2022-06-03 DIAGNOSIS — N39498 Other specified urinary incontinence: Secondary | ICD-10-CM | POA: Diagnosis not present

## 2022-06-03 DIAGNOSIS — R600 Localized edema: Secondary | ICD-10-CM

## 2022-06-03 DIAGNOSIS — R7303 Prediabetes: Secondary | ICD-10-CM

## 2022-06-03 DIAGNOSIS — J432 Centrilobular emphysema: Secondary | ICD-10-CM

## 2022-06-03 DIAGNOSIS — Z720 Tobacco use: Secondary | ICD-10-CM | POA: Diagnosis not present

## 2022-06-03 DIAGNOSIS — G8929 Other chronic pain: Secondary | ICD-10-CM

## 2022-06-03 DIAGNOSIS — K5909 Other constipation: Secondary | ICD-10-CM

## 2022-06-03 DIAGNOSIS — M5441 Lumbago with sciatica, right side: Secondary | ICD-10-CM

## 2022-06-03 LAB — POCT URINALYSIS DIP (CLINITEK)
Bilirubin, UA: NEGATIVE
Blood, UA: NEGATIVE
Glucose, UA: NEGATIVE mg/dL
Ketones, POC UA: NEGATIVE mg/dL
Nitrite, UA: NEGATIVE
POC PROTEIN,UA: 30 — AB
Spec Grav, UA: 1.02 (ref 1.010–1.025)
Urobilinogen, UA: 0.2 E.U./dL — AB
pH, UA: 7.5 (ref 5.0–8.0)

## 2022-06-03 MED ORDER — AZELASTINE HCL 137 MCG/SPRAY NA SOLN: 1.0000 | Freq: Every day | NASAL | 6 refills | Status: AC

## 2022-06-03 MED ORDER — ATORVASTATIN CALCIUM 20 MG PO TABS: ORAL_TABLET | Freq: Every day | ORAL | 1 refills | Status: DC

## 2022-06-03 MED ORDER — AMITRIPTYLINE HCL 50 MG PO TABS: ORAL_TABLET | Freq: Every day | ORAL | 1 refills | Status: DC

## 2022-06-03 MED ORDER — DULOXETINE HCL 60 MG PO CPEP: ORAL_CAPSULE | Freq: Every day | ORAL | 1 refills | Status: DC

## 2022-06-03 MED ORDER — NICOTINE 21 MG/24HR TD PT24
21.0000 mg | MEDICATED_PATCH | Freq: Every day | TRANSDERMAL | 2 refills | Status: DC
Start: 2022-06-03 — End: 2023-07-08

## 2022-06-03 MED ORDER — OXYBUTYNIN CHLORIDE 5 MG PO TABS: ORAL_TABLET | Freq: Two times a day (BID) | ORAL | 1 refills | Status: DC

## 2022-06-03 MED ORDER — PREGABALIN 75 MG PO CAPS
75.0000 mg | ORAL_CAPSULE | Freq: Three times a day (TID) | ORAL | 5 refills | Status: DC
Start: 2022-06-03 — End: 2022-09-29

## 2022-06-03 MED ORDER — POLYETHYLENE GLYCOL 3350 17 GM/SCOOP PO POWD: 17.0000 g | Freq: Every day | ORAL | 1 refills | Status: DC

## 2022-06-03 MED ORDER — MONTELUKAST SODIUM 10 MG PO TABS: 10.0000 mg | ORAL_TABLET | Freq: Every day | ORAL | 1 refills | Status: DC

## 2022-06-03 MED ORDER — CYCLOBENZAPRINE HCL 10 MG PO TABS: ORAL_TABLET | Freq: Two times a day (BID) | ORAL | 1 refills | Status: DC | PRN

## 2022-06-03 NOTE — Telephone Encounter (Signed)
She needs assistance with subsidized low income housing.  Thank you

## 2022-06-03 NOTE — Patient Instructions (Signed)
Edema  Edema is an abnormal buildup of fluids in the body tissues and under the skin. Swelling of the legs, feet, and ankles is a common symptom that becomes more likely as you get older. Swelling is also common in looser tissues, such as around the eyes. Pressing on the area may make a temporary dent in your skin (pitting edema). This fluid may also accumulate in your lungs (pulmonary edema). There are many possible causes of edema. Eating too much salt (sodium) and being on your feet or sitting for a long time can cause edema in your legs, feet, and ankles. Common causes of edema include: Certain medical conditions, such as heart failure, liver or kidney disease, and cancer. Weak leg blood vessels. An injury. Pregnancy. Medicines. Being obese. Low protein levels in the blood. Hot weather may make edema worse. Edema is usually painless. Your skin may look swollen or shiny. Follow these instructions at home: Medicines Take over-the-counter and prescription medicines only as told by your health care provider. Your health care provider may prescribe a medicine to help your body get rid of extra water (diuretic). Take this medicine if you are told to take it. Eating and drinking Eat a low-salt (low-sodium) diet to reduce fluid as told by your health care provider. Sometimes, eating less salt may reduce swelling. Depending on the cause of your swelling, you may need to limit how much fluid you drink (fluid restriction). General instructions Raise (elevate) the injured area above the level of your heart while you are sitting or lying down. Do not sit still or stand for long periods of time. Do not wear tight clothing. Do not wear garters on your upper legs. Exercise your legs to get your circulation going. This helps to move the fluid back into your blood vessels, and it may help the swelling go down. Wear compression stockings as told by your health care provider. These stockings help to prevent  blood clots and reduce swelling in your legs. It is important that these are the correct size. These stockings should be prescribed by your health care provider to prevent possible injuries. If elastic bandages or wraps are recommended, use them as told by your health care provider. Contact a health care provider if: Your edema does not get better with treatment. You have heart, liver, or kidney disease and have symptoms of edema. You have sudden and unexplained weight gain. Get help right away if: You develop shortness of breath or chest pain. You cannot breathe when you lie down. You develop pain, redness, or warmth in the swollen areas. You have heart, liver, or kidney disease and suddenly get edema. You have a fever and your symptoms suddenly get worse. These symptoms may be an emergency. Get help right away. Call 911. Do not wait to see if the symptoms will go away. Do not drive yourself to the hospital. Summary Edema is an abnormal buildup of fluids in the body tissues and under the skin. Eating too much salt (sodium)and being on your feet or sitting for a long time can cause edema in your legs, feet, and ankles. Raise (elevate) the injured area above the level of your heart while you are sitting or lying down. Follow your health care provider's instructions about diet and how much fluid you can drink. This information is not intended to replace advice given to you by your health care provider. Make sure you discuss any questions you have with your health care provider. Document Revised: 09/08/2020 Document   Reviewed: 09/08/2020 [employer] Patient Education  2023 [employer]  

## 2022-06-03 NOTE — Progress Notes (Signed)
Burning urination Constipation.

## 2022-06-03 NOTE — Progress Notes (Signed)
Subjective:  Patient ID: Cheryl King, female    DOB: 10-28-1965  Age: 57 y.o. MRN: [id number]  CC: burning urination   HPI Cheryl King is a 57 y.o. year old female with a history of Prediabetes (diet controlled A1c 5.9) chronic low back pain with sciatica, GERD, hyperlipidemia,  Emphysema (on 4L oxygen) tobacco abuse.   Interval History:  Oxygen saturation is low at 89% this was taken on room air.  She remains on 4 L of oxygen at rest..  Last seen by pulmonary in 12/2021. She continues to smoke every time she is stressed and is not ready to quit. Next visit is in 2 months. She currently resides with her mom and this is a stressful situation for her.  Complains of constipation and has to strain to move her bowels. This is intermittent. Of note she is on an opioid analgesics - Tylenol #3. For the last one month she has had remittent dysuria. She has been holding her urine and not going to the bathroom as frequently as she says she is should when she feels the urge.  Denies presence of flank pain or pelvic pain.  Her back continues to hurt and she is on Lyrica in addition to Tylenol 3.  Pain radiates down both lower extremities.  She continues to see orthopedic Dr. Christell Constant. Doing well on her statin and her migraines are controlled on amitriptyline. She has noticed intermittent pedal edema and does use her arms Lasix once in a while.  Denies presence of weight gain and dyspnea is at baseline.   Past Medical History:  Diagnosis Date   Acid reflux    Allergy    Carpal tunnel syndrome    COPD (chronic obstructive pulmonary disease) (HCC)    Depression    Diverticulitis    Hyperlipidemia    Low back pain    Neck pain    Sacroiliac inflammation (HCC)    Sciatica    Seasonal allergies     Past Surgical History:  Procedure Laterality Date   ABDOMINAL SURGERY     ANKLE SURGERY     lt.   BREAST EXCISIONAL BIOPSY Right    pt states years ago- not sure when   PARTIAL  HYSTERECTOMY     right breast cyst removed      URETHRAL DIVERTICULUM REPAIR     uretrral diverticultis    Family History  Problem Relation Age of Onset   Healthy Mother    Other Father        Unsure of medical history   Asthma Maternal Aunt    Diabetes Maternal Aunt    Cancer Maternal Aunt    Breast cancer Maternal Aunt    Hypertension Maternal Grandmother    Heart Problems Maternal Grandmother    Hypertension Other    COPD Other    Colon cancer Neg Hx    Pancreatic cancer Neg Hx    Rectal cancer Neg Hx    Stomach cancer Neg Hx    Colon polyps Neg Hx    Esophageal cancer Neg Hx     Social History   Socioeconomic History   Marital status: Married    Spouse name: Iantha Fallen   Number of children: 0   Years of education: Bachelors   Highest education level: Associate degree: academic program  Occupational History   Occupation: Unemployed   Occupation: past Conservation officer, nature, International aid/development worker at [employer], office positions, Landscape architect.  Tobacco Use   Smoking status: Every Day  Packs/day: 0.25    Years: 35.00    Additional pack years: 0.00    Total pack years: 8.75    Types: Cigarettes    Start date: 82    Passive exposure: Past (uses patches, down to 3 cigarettes a day)   Smokeless tobacco: Never   Tobacco comments:    Pt states she smokes about 2 ciggs daily.  Vaping Use   Vaping Use: Never used  Substance and Sexual Activity   Alcohol use: Not Currently    Comment: Social only - seldom   Drug use: No   Sexual activity: Never  Other Topics Concern   Not on file  Social History Narrative   Lives at home alone.   Right-handed.   Occasional caffeine use.   Social Determinants of Health   Financial Resource Strain: Not on file  Food Insecurity: No Food Insecurity (10/22/2020)   Hunger Vital Sign    Worried About Running Out of Food in the Last Year: Never true    Ran Out of Food in the Last Year: Never true  Transportation Needs: Not on file   Physical Activity: Not on file  Stress: Not on file  Social Connections: Not on file    Allergies  Allergen Reactions   Percocet [Oxycodone-Acetaminophen] Nausea Only   Vicodin [Hydrocodone-Acetaminophen] Nausea Only   Penicillins Rash    Outpatient Medications Prior to Visit  Medication Sig Dispense Refill   acetaminophen-codeine (TYLENOL #3) 300-30 MG tablet TAKE 1 TO 2 TABLETS BY MOUTH EVERY 6 HOURS AS  NEEDED  FOR  PAIN  FOR  UP  TO  5  DAYS 30 tablet 0   albuterol (VENTOLIN HFA) 108 (90 Base) MCG/ACT inhaler Inhale 2 puffs into the lungs every 6 (six) hours as needed for wheezing or shortness of breath. 18 g 5   cetirizine (ZYRTEC) 10 MG tablet Take 1 tablet (10 mg total) by mouth daily. 90 tablet 1   diazepam (VALIUM) 5 MG tablet Take 1 by mouth 1 hour  pre-procedure with very light food. May bring 2nd tablet to appointment. 2 tablet 0   diclofenac sodium (VOLTAREN) 1 % GEL Apply 2 g topically 4 (four) times daily. 5 Tube 2   esomeprazole (NEXIUM) 40 MG capsule Take 40 mg by mouth 2 (two) times daily.     EYSUVIS 0.25 % SUSP Apply 1 drop to eye 4 (four) times daily.     ezetimibe (ZETIA) 10 MG tablet TAKE 1 TABLET BY MOUTH DAILY AFTER SUPPER 90 tablet 1   fluconazole (DIFLUCAN) 150 MG tablet Take 1 tablet (150 mg total) by mouth once a week. 2 tablet 0   fluticasone (FLONASE) 50 MCG/ACT nasal spray SPRAY 1 SPRAY INTO BOTH NOSTRILS DAILY. 16 mL 1   Fluticasone-Umeclidin-Vilant (TRELEGY ELLIPTA) 100-62.5-25 MCG/ACT AEPB Inhale 28 each into the lungs daily. 28 each 0   Fluticasone-Umeclidin-Vilant (TRELEGY ELLIPTA) 100-62.5-25 MCG/ACT AEPB Inhale 1 puff into the lungs daily. 1 each 5   Lactobacillus CAPS Take 1 capsule by mouth daily.     lidocaine (LIDODERM) 5 % Place 1 patch onto the skin daily. Remove & Discard patch within 12 hours or as directed by MD 30 patch 3   miconazole (MICATIN) 2 % cream Apply 1 application topically 2 (two) times daily. 28.35 g 0   Misc. Devices MISC 1.  Rolling walker with seat 2. Shower chair 3. Elevated toilet seat.   Diagnosis-chronic back pain 1 each 0   Multiple Vitamin (MULTIVITAMIN WITH MINERALS) TABS  tablet Take 1 tablet by mouth daily.     phenazopyridine (PYRIDIUM) 200 MG tablet Take 1 tablet (200 mg total) by mouth 3 (three) times daily as needed for pain. 20 tablet 0   RESTASIS 0.05 % ophthalmic emulsion   3   triamcinolone cream (KENALOG) 0.1 % Apply 1 application topically 2 (two) times daily. 45 g 1   varenicline (CHANTIX) 1 MG tablet Take 1 tablet (1 mg total) by mouth 2 (two) times daily. 60 tablet 2   amitriptyline (ELAVIL) 50 MG tablet TAKE 1 TABLET (50 MG TOTAL) BY MOUTH AT BEDTIME. 90 tablet 1   atorvastatin (LIPITOR) 20 MG tablet TAKE 1 TABLET (20 MG TOTAL) BY MOUTH DAILY. 90 tablet 1   Azelastine HCl 137 MCG/SPRAY SOLN Place into both nostrils.     cyclobenzaprine (FLEXERIL) 10 MG tablet TAKE 1 TABLET (10 MG TOTAL) BY MOUTH 2 (TWO) TIMES DAILY AS NEEDED FOR MUSCLE SPASMS. 180 tablet 1   DULoxetine (CYMBALTA) 60 MG capsule TAKE 1 CAPSULE (60 MG TOTAL) BY MOUTH DAILY. 90 capsule 1   montelukast (SINGULAIR) 10 MG tablet Take 10 mg by mouth daily.     nicotine (NICODERM CQ - DOSED IN MG/24 HOURS) 21 mg/24hr patch Place 1 patch (21 mg total) onto the skin daily. Then decrease to 14mg /24 hr 28 patch 2   oxybutynin (DITROPAN) 5 MG tablet TAKE 1 TABLET (5 MG TOTAL) BY MOUTH 2 (TWO) TIMES DAILY. 180 tablet 1   pregabalin (LYRICA) 75 MG capsule Take 1 capsule (75 mg total) by mouth 3 (three) times daily. 90 capsule 5   Facility-Administered Medications Prior to Visit  Medication Dose Route Frequency Provider Last Rate Last Admin   0.9 %  sodium chloride infusion  500 mL Intravenous Once Charlie Pitter III, MD         ROS Review of Systems  Constitutional:  Negative for activity change and appetite change.  HENT:  Negative for sinus pressure and sore throat.   Respiratory:  Positive for shortness of breath (at baseline).  Negative for chest tightness and wheezing.   Cardiovascular:  Positive for leg swelling. Negative for chest pain and palpitations.  Gastrointestinal:  Positive for constipation. Negative for abdominal distention and abdominal pain.  Genitourinary:  Positive for dysuria.  Musculoskeletal: Negative.   Psychiatric/Behavioral:  Negative for behavioral problems and dysphoric mood.     Objective:  BP 107/70   Pulse 77   Ht 5\' 5"  (1.651 m)   Wt 174 lb 12.8 oz (79.3 kg)   SpO2 (!) 89% Comment: patient has oxygen tank off.  BMI 29.09 kg/m      06/03/2022   10:43 AM 05/06/2022    3:34 PM 04/29/2022    8:39 AM  BP/Weight  Systolic BP 107 128 113  Diastolic BP 70 66 73  Wt. (Lbs) 174.8 173.6   BMI 29.09 kg/m2 29.34 kg/m2       Physical Exam Constitutional:      Appearance: She is well-developed.  HENT:     Nose:     Comments: On 4 L oxygen at rest Cardiovascular:     Rate and Rhythm: Normal rate.     Heart sounds: Normal heart sounds. No murmur heard. Pulmonary:     Effort: Pulmonary effort is normal.     Breath sounds: Normal breath sounds. No wheezing or rales.  Chest:     Chest wall: No tenderness.  Abdominal:     General: Bowel sounds are normal. There is  no distension.     Palpations: Abdomen is soft. There is no mass.     Tenderness: There is no abdominal tenderness. There is no right CVA tenderness or left CVA tenderness.  Musculoskeletal:        General: Normal range of motion.     Right lower leg: No edema.     Left lower leg: Edema present.     Comments: Tenderness on palpation of lumbosacral spine.  Positive straight leg raise bilaterally  Neurological:     Mental Status: She is alert and oriented to person, place, and time.  Psychiatric:        Mood and Affect: Mood normal.        Latest Ref Rng & Units 12/28/2021    6:38 PM 12/03/2021    9:57 AM 06/01/2021    9:53 AM  CMP  Glucose 70 - 99 mg/dL 161  85  89   BUN 6 - 20 mg/dL 13  11  14    Creatinine  0.44 - 1.00 mg/dL 0.96  0.45  4.09   Sodium 135 - 145 mmol/L 137  143  142   Potassium 3.5 - 5.1 mmol/L 3.9  4.6  4.4   Chloride 98 - 111 mmol/L 105  104  105   CO2 22 - 32 mmol/L 25  27  26    Calcium 8.9 - 10.3 mg/dL 9.0  9.6  9.1   Total Protein 6.5 - 8.1 g/dL 7.1  7.0  6.6   Total Bilirubin 0.3 - 1.2 mg/dL 0.3  0.3  0.2   Alkaline Phos 38 - 126 U/L 94  121  103   AST 15 - 41 U/L 29  27  21    ALT 0 - 44 U/L 22  16  15      Lipid Panel     Component Value Date/Time   CHOL 148 12/03/2021 0957   TRIG 74 12/03/2021 0957   HDL 67 12/03/2021 0957   CHOLHDL 3.2 01/28/2020 1012   CHOLHDL 3.2 12/26/2013 1319   VLDL 18 12/26/2013 1319   LDLCALC 67 12/03/2021 0957    CBC    Component Value Date/Time   WBC 8.9 12/28/2021 1838   RBC 4.43 12/28/2021 1838   HGB 13.9 12/28/2021 1838   HGB 12.2 06/01/2021 0953   HCT 41.7 12/28/2021 1838   HCT 38.2 06/01/2021 0953   PLT 188 12/28/2021 1838   PLT 246 06/01/2021 0953   MCV 94.1 12/28/2021 1838   MCV 92 06/01/2021 0953   MCH 31.4 12/28/2021 1838   MCHC 33.3 12/28/2021 1838   RDW 15.9 (H) 12/28/2021 1838   RDW 15.6 (H) 06/01/2021 0953   LYMPHSABS 1.8 12/28/2021 1838   LYMPHSABS 1.9 06/01/2021 0953   MONOABS 1.2 (H) 12/28/2021 1838   EOSABS 0.1 12/28/2021 1838   EOSABS 0.2 06/01/2021 0953   BASOSABS 0.0 12/28/2021 1838   BASOSABS 0.0 06/01/2021 0953    Lab Results  Component Value Date   HGBA1C 5.9 12/03/2021    Assessment & Plan:  1. Tobacco abuse Spent 3 minutes counseling on smoking cessation but she is not ready to quit at this time - nicotine (NICODERM CQ - DOSED IN MG/24 HOURS) 21 mg/24hr patch; Place 1 patch (21 mg total) onto the skin daily. Then decrease to 14mg /24 hr  Dispense: 28 patch; Refill: 2  2. Chronic tension-type headache, not intractable Controlled - amitriptyline (ELAVIL) 50 MG tablet; TAKE 1 TABLET (50 MG TOTAL) BY MOUTH AT BEDTIME.  Dispense:  90 tablet; Refill: 1 - cyclobenzaprine (FLEXERIL) 10 MG  tablet; TAKE 1 TABLET (10 MG TOTAL) BY MOUTH 2 (TWO) TIMES DAILY AS NEEDED FOR MUSCLE SPASMS.  Dispense: 180 tablet; Refill: 1 - DULoxetine (CYMBALTA) 60 MG capsule; TAKE 1 CAPSULE (60 MG TOTAL) BY MOUTH DAILY.  Dispense: 90 capsule; Refill: 1  3. Pure hypercholesterolemia Controlled Continue atorvastatin Low-cholesterol diet - atorvastatin (LIPITOR) 20 MG tablet; TAKE 1 TABLET (20 MG TOTAL) BY MOUTH DAILY.  Dispense: 90 tablet; Refill: 1 - LP+Non-HDL Cholesterol - CMP14+EGFR - CBC with Differential/Platelet  4. Other urinary incontinence Stable - oxybutynin (DITROPAN) 5 MG tablet; TAKE 1 TABLET (5 MG TOTAL) BY MOUTH 2 (TWO) TIMES DAILY.  Dispense: 180 tablet; Refill: 1  5. Chronic midline low back pain with bilateral sciatica Uncontrolled She is status post corticosteroid injections Currently followed by orthopedics Continue Tylenol 3, Flexeril - pregabalin (LYRICA) 75 MG capsule; Take 1 capsule (75 mg total) by mouth 3 (three) times daily.  Dispense: 90 capsule; Refill: 5  6. Other constipation Likely opioid induced Will place on MiraLAX Counseled on increasing fiber intake, fruits and vegetable, limit intake of foods like cheese, white bread, white rice - polyethylene glycol powder (GLYCOLAX/MIRALAX) 17 GM/SCOOP powder; Take 17 g by mouth daily.  Dispense: 3350 g; Refill: 1  7. Pedal edema Slight pedal edema which could be dependent She has a soft blood pressure which precludes my prescribing Lasix for her to prevent hypotension Encouraged to comply with a low-sodium diet, elevate feet, use compression stockings   8. Acute cystitis without hematuria UA is positive for UTI Will send off urine culture and treat accordingly Advised to increase fluid intake - POCT URINALYSIS DIP (CLINITEK) - Urine Culture  9. Prediabetes Labs reveal prediabetes with an A1c of 5.9.  Working on a low carbohydrate diet, exercise, weight loss is recommended in order to prevent progression to  type 2 diabetes mellitus. - Hemoglobin A1c  10. Centrilobular emphysema She has been unable to obtain Trelegy due to it needing a prior authorization.  Advised to contact her pulmonologist to prescribe medication so he can perform cauterization for her medication Oxygen saturation was 89% on room air.  Advised to use 4 L of oxygen as prescribed Advised her smoking cessation will be beneficial but she is not ready to quit at this time.  Meds ordered this encounter  Medications   polyethylene glycol powder (GLYCOLAX/MIRALAX) 17 GM/SCOOP powder    Sig: Take 17 g by mouth daily.    Dispense:  3350 g    Refill:  1   amitriptyline (ELAVIL) 50 MG tablet    Sig: TAKE 1 TABLET (50 MG TOTAL) BY MOUTH AT BEDTIME.    Dispense:  90 tablet    Refill:  1   atorvastatin (LIPITOR) 20 MG tablet    Sig: TAKE 1 TABLET (20 MG TOTAL) BY MOUTH DAILY.    Dispense:  90 tablet    Refill:  1   Azelastine HCl 137 MCG/SPRAY SOLN    Sig: Place 1 spray into both nostrils daily.    Dispense:  30 mL    Refill:  6   cyclobenzaprine (FLEXERIL) 10 MG tablet    Sig: TAKE 1 TABLET (10 MG TOTAL) BY MOUTH 2 (TWO) TIMES DAILY AS NEEDED FOR MUSCLE SPASMS.    Dispense:  180 tablet    Refill:  1   DULoxetine (CYMBALTA) 60 MG capsule    Sig: TAKE 1 CAPSULE (60 MG TOTAL) BY MOUTH DAILY.  Dispense:  90 capsule    Refill:  1   oxybutynin (DITROPAN) 5 MG tablet    Sig: TAKE 1 TABLET (5 MG TOTAL) BY MOUTH 2 (TWO) TIMES DAILY.    Dispense:  180 tablet    Refill:  1   pregabalin (LYRICA) 75 MG capsule    Sig: Take 1 capsule (75 mg total) by mouth 3 (three) times daily.    Dispense:  90 capsule    Refill:  5   montelukast (SINGULAIR) 10 MG tablet    Sig: Take 1 tablet (10 mg total) by mouth daily.    Dispense:  90 tablet    Refill:  1   nicotine (NICODERM CQ - DOSED IN MG/24 HOURS) 21 mg/24hr patch    Sig: Place 1 patch (21 mg total) onto the skin daily. Then decrease to 14mg /24 hr    Dispense:  28 patch    Refill:  2    Visit required 49 minutes of patient care including median intraservice time, reviewing previous notes and test results, coordination of care, counseling the patient in addition to management of chronic medical conditions.Time also spent ordering medications, investigations and documenting in the chart.  All questions were answered to the patient's satisfaction  Follow-up: Return in about 6 months (around 12/04/2022) for Chronic medical conditions.       Hoy Register, MD, FAAFP. [facility] [place], [place] [phone]   06/03/2022, 12:09 PM

## 2022-06-04 LAB — CBC WITH DIFFERENTIAL/PLATELET
Basophils Absolute: 0 10*3/uL (ref 0.0–0.2)
Basos: 0 %
EOS (ABSOLUTE): 0.1 10*3/uL (ref 0.0–0.4)
Eos: 2 %
Hematocrit: 39.8 % (ref 34.0–46.6)
Hemoglobin: 13.2 g/dL (ref 11.1–15.9)
Immature Grans (Abs): 0 10*3/uL (ref 0.0–0.1)
Immature Granulocytes: 0 %
Lymphocytes Absolute: 2.3 10*3/uL (ref 0.7–3.1)
Lymphs: 30 %
MCH: 31.7 pg (ref 26.6–33.0)
MCHC: 33.2 g/dL (ref 31.5–35.7)
MCV: 95 fL (ref 79–97)
Monocytes Absolute: 0.7 10*3/uL (ref 0.1–0.9)
Monocytes: 10 %
Neutrophils Absolute: 4.4 10*3/uL (ref 1.4–7.0)
Neutrophils: 58 %
Platelets: 207 10*3/uL (ref 150–450)
RBC: 4.17 x10E6/uL (ref 3.77–5.28)
RDW: 13.8 % (ref 11.7–15.4)
WBC: 7.5 10*3/uL (ref 3.4–10.8)

## 2022-06-04 LAB — LP+NON-HDL CHOLESTEROL
Cholesterol, Total: 170 mg/dL (ref 100–199)
HDL: 72 mg/dL (ref >39)
LDL Chol Calc (NIH): 85 mg/dL (ref 0–99)
Total Non-HDL-Chol (LDL+VLDL): 98 mg/dL (ref 0–129)
Triglycerides: 71 mg/dL (ref 0–149)
VLDL Cholesterol Cal: 13 mg/dL (ref 5–40)

## 2022-06-04 LAB — CMP14+EGFR
ALT: 22 IU/L (ref 0–32)
AST: 28 IU/L (ref 0–40)
Albumin/Globulin Ratio: 1.6 (ref 1.2–2.2)
Albumin: 4.1 g/dL (ref 3.8–4.9)
Alkaline Phosphatase: 126 IU/L — ABNORMAL HIGH (ref 44–121)
BUN/Creatinine Ratio: 17 (ref 9–23)
BUN: 14 mg/dL (ref 6–24)
Bilirubin Total: 0.3 mg/dL (ref 0.0–1.2)
CO2: 24 mmol/L (ref 20–29)
Calcium: 9.4 mg/dL (ref 8.7–10.2)
Chloride: 101 mmol/L (ref 96–106)
Creatinine, Ser: 0.81 mg/dL (ref 0.57–1.00)
Globulin, Total: 2.6 g/dL (ref 1.5–4.5)
Glucose: 80 mg/dL (ref 70–99)
Potassium: 4.6 mmol/L (ref 3.5–5.2)
Sodium: 140 mmol/L (ref 134–144)
Total Protein: 6.7 g/dL (ref 6.0–8.5)
eGFR: 84 mL/min/{1.73_m2} (ref >59)

## 2022-06-04 LAB — HEMOGLOBIN A1C
Est. average glucose Bld gHb Est-mCnc: 128 mg/dL
Hgb A1c MFr Bld: 6.1 % — ABNORMAL HIGH (ref 4.8–5.6)

## 2022-06-05 LAB — URINE CULTURE

## 2022-06-07 ENCOUNTER — Telehealth: Payer: Self-pay

## 2022-06-07 ENCOUNTER — Other Ambulatory Visit (HOSPITAL_COMMUNITY): Payer: Self-pay

## 2022-06-07 ENCOUNTER — Encounter: Payer: Self-pay | Admitting: Family Medicine

## 2022-06-07 ENCOUNTER — Other Ambulatory Visit: Payer: Self-pay | Admitting: Family Medicine

## 2022-06-07 MED ORDER — NITROFURANTOIN MONOHYD MACRO 100 MG PO CAPS: 100.0000 mg | ORAL_CAPSULE | Freq: Two times a day (BID) | ORAL | 0 refills | Status: DC

## 2022-06-07 NOTE — Telephone Encounter (Signed)
PA request received via CMM for Trelegy Ellipta 100-62.5-25MCG/ACT aerosol powder  PA submitted to [facility] [employer] and has been APPROVED from 06/07/2022-06/06/2023  Key: [id number]

## 2022-06-08 ENCOUNTER — Encounter: Payer: Self-pay | Admitting: Podiatry

## 2022-06-08 ENCOUNTER — Encounter: Payer: Self-pay | Admitting: Family Medicine

## 2022-06-09 ENCOUNTER — Other Ambulatory Visit: Payer: Self-pay | Admitting: Family Medicine

## 2022-06-09 ENCOUNTER — Encounter: Payer: Self-pay | Admitting: Family Medicine

## 2022-06-09 MED ORDER — MISC. DEVICES MISC: 0 refills | Status: AC

## 2022-06-17 ENCOUNTER — Encounter: Payer: Self-pay | Admitting: Podiatry

## 2022-06-17 ENCOUNTER — Encounter: Payer: Self-pay | Admitting: Family Medicine

## 2022-06-17 ENCOUNTER — Ambulatory Visit: Payer: Medicaid Other | Admitting: Podiatry

## 2022-06-17 DIAGNOSIS — M7752 Other enthesopathy of left foot: Secondary | ICD-10-CM

## 2022-06-18 ENCOUNTER — Other Ambulatory Visit: Payer: Self-pay | Admitting: Family Medicine

## 2022-06-18 ENCOUNTER — Other Ambulatory Visit: Payer: Self-pay | Admitting: Orthopedic Surgery

## 2022-06-18 MED ORDER — ROSUVASTATIN CALCIUM 5 MG PO TABS
5.0000 mg | ORAL_TABLET | Freq: Every day | ORAL | 1 refills | Status: DC
Start: 2022-06-18 — End: 2022-09-29

## 2022-06-18 NOTE — Progress Notes (Signed)
Subjective:   Patient ID: Cheryl King, female   DOB: 57 y.o.   MRN: [id number]   HPI Patient states she has a lot of pain in her left foot which seems to emanate from the ankle and she is having trouble weightbearing.  Doing fine where nail was removed   ROS      Objective:  Physical Exam  Neurovascular status intact inflammation pain of the sinus tarsi into the ankle joint left fluid buildup with moderate discomfort in the entire foot nailbed left second healing well crusted over no erythema edema drainage     Assessment:  May be an inflammatory condition but is involving the entire foot and ankle      Plan:  H&P reviewed today I did sterile prep I injected the sinus tarsi 3 mg Kenalog 5 mg Xylocaine applied a below the knee boot properly fitted to her lower leg.  Patient will be seen back and hopefully immobilization will solve this problem

## 2022-06-22 ENCOUNTER — Ambulatory Visit (INDEPENDENT_AMBULATORY_CARE_PROVIDER_SITE_OTHER): Payer: Medicaid Other | Admitting: Acute Care

## 2022-06-22 ENCOUNTER — Encounter: Payer: Self-pay | Admitting: Acute Care

## 2022-06-22 DIAGNOSIS — F17210 Nicotine dependence, cigarettes, uncomplicated: Secondary | ICD-10-CM | POA: Diagnosis not present

## 2022-06-22 NOTE — Progress Notes (Signed)
Virtual Visit via Telephone Note  I connected with Cheryl King on 06/22/22 at  3:00 PM EDT by telephone and verified that I am speaking with the correct person using two identifiers.  Location: Patient:  At home Provider:  [address] [place], [place], [address]    I discussed the limitations, risks, security and privacy concerns of performing an evaluation and management service by telephone and the availability of in person appointments. I also discussed with the patient that there may be a patient responsible charge related to this service. The patient expressed understanding and agreed to proceed.   Shared Decision Making Visit Lung Cancer Screening Program [phone])   Eligibility: Age 57 y.o. Pack Years Smoking History Calculation 21 pack year smoking history (# packs/per year x # years smoked) Recent History of coughing up blood  no Unexplained weight loss? no ( >Than 15 pounds within the last 6 months ) Prior History Lung / other cancer no (Diagnosis within the last 5 years already requiring surveillance chest CT Scans). Smoking Status Current Smoker Former Smokers: Years since quit:  NA  Quit Date:  NA  Visit Components: Discussion included one or more decision making aids. yes Discussion included risk/benefits of screening. yes Discussion included potential follow up diagnostic testing for abnormal scans. yes Discussion included meaning and risk of over diagnosis. yes Discussion included meaning and risk of False Positives. yes Discussion included meaning of total radiation exposure. yes  Counseling Included: Importance of adherence to annual lung cancer LDCT screening. yes Impact of comorbidities on ability to participate in the program. yes Ability and willingness to under diagnostic treatment. yes  Smoking Cessation Counseling: Current Smokers:  Discussed importance of smoking cessation. yes Information about tobacco cessation classes and  interventions provided to patient. yes Patient provided with "ticket" for LDCT Scan. yes Symptomatic Patient. no  Counseling NA Diagnosis Code: Tobacco Use Z72.0 Asymptomatic Patient yes  Counseling (Intermediate counseling: > three minutes counseling) U0454 Former Smokers:  Discussed the importance of maintaining cigarette abstinence. yes Diagnosis Code: Personal History of Nicotine Dependence. U98.119 Information about tobacco cessation classes and interventions provided to patient. Yes Patient provided with "ticket" for LDCT Scan. yes Written Order for Lung Cancer Screening with LDCT placed in Epic. Yes (CT Chest Lung Cancer Screening Low Dose W/O CM) [id number] Z12.2-Screening of respiratory organs Z87.891-Personal history of nicotine dependence  I have spent 25 minutes of face to face/ virtual visit   time with  Ms. Stavig discussing the risks and benefits of lung cancer screening. We viewed / discussed a power point together that explained in detail the above noted topics. We paused at intervals to allow for questions to be asked and answered to ensure understanding.We discussed that the single most powerful action that she can take to decrease her risk of developing lung cancer is to quit smoking. We discussed whether or not she is ready to commit to setting a quit date. We discussed options for tools to aid in quitting smoking including nicotine replacement therapy, non-nicotine medications, support groups, Quit Smart classes, and behavior modification. We discussed that often times setting smaller, more achievable goals, such as eliminating 1 cigarette a day for a week and then 2 cigarettes a day for a week can be helpful in slowly decreasing the number of cigarettes smoked. This allows for a sense of accomplishment as well as providing a clinical benefit. I provided  her  with smoking cessation  information  with contact information for community resources, classes,  free nicotine  replacement therapy, and access to mobile apps, text messaging, and on-line smoking cessation help. I have also provided  her  the office contact information in the event she needs to contact me, or the screening staff. We discussed the time and location of the scan, and that either Abigail Miyamoto RN, Karlton Lemon, RN  or I will call / send a letter with the results within 24-72 hours of receiving them. The patient verbalized understanding of all of  the above and had no further questions upon leaving the office. They have my contact information in the event they have any further questions.  I spent 3 minutes counseling on smoking cessation and the health risks of continued tobacco abuse.  I explained to the patient that there has been a high incidence of coronary artery disease noted on these exams. I explained that this is a non-gated exam therefore degree or severity cannot be determined. This patient is on statin therapy. I have asked the patient to follow-up with their PCP regarding any incidental finding of coronary artery disease and management with diet or medication as their PCP  feels is clinically indicated. The patient verbalized understanding of the above and had no further questions upon completion of the visit.      Bevelyn Ngo, NP 06/22/2022

## 2022-06-23 ENCOUNTER — Ambulatory Visit
Admission: RE | Admit: 2022-06-23 | Discharge: 2022-06-23 | Disposition: A | Discharge status: Home / Self Care | Payer: Medicaid Other | Source: Ambulatory Visit | Attending: Family Medicine | Admitting: Family Medicine

## 2022-06-23 DIAGNOSIS — Z87891 Personal history of nicotine dependence: Secondary | ICD-10-CM

## 2022-06-23 DIAGNOSIS — F17210 Nicotine dependence, cigarettes, uncomplicated: Secondary | ICD-10-CM

## 2022-06-23 NOTE — Telephone Encounter (Signed)
Pt needs housing resources, currently staying with her mother. Filed for section 8 in 2019. She is still on the list and waiting. I asked her about employment. She applied for disability and she was denied. She is currently receiving supplemental SS. Pt is going to call back and give me information for her housing authority and when she last applied for Disability. Pt was driving.

## 2022-06-29 ENCOUNTER — Other Ambulatory Visit: Payer: Self-pay | Admitting: Physical Medicine and Rehabilitation

## 2022-06-29 ENCOUNTER — Encounter: Payer: Self-pay | Admitting: Physical Medicine and Rehabilitation

## 2022-06-29 DIAGNOSIS — M5416 Radiculopathy, lumbar region: Secondary | ICD-10-CM

## 2022-06-30 ENCOUNTER — Other Ambulatory Visit: Payer: Self-pay | Admitting: Acute Care

## 2022-06-30 DIAGNOSIS — F17210 Nicotine dependence, cigarettes, uncomplicated: Secondary | ICD-10-CM

## 2022-06-30 DIAGNOSIS — Z87891 Personal history of nicotine dependence: Secondary | ICD-10-CM

## 2022-06-30 DIAGNOSIS — Z122 Encounter for screening for malignant neoplasm of respiratory organs: Secondary | ICD-10-CM

## 2022-07-12 ENCOUNTER — Ambulatory Visit: Payer: Medicaid Other | Admitting: Physician Assistant

## 2022-07-12 ENCOUNTER — Encounter: Payer: Self-pay | Admitting: Physician Assistant

## 2022-07-12 VITALS — Ht 65.0 in | Wt 178.0 lb

## 2022-07-12 DIAGNOSIS — M19072 Primary osteoarthritis, left ankle and foot: Secondary | ICD-10-CM | POA: Insufficient documentation

## 2022-07-12 NOTE — Progress Notes (Signed)
Office Visit Note   Patient: Cheryl King           Date of Birth: 01-05-1966           MRN: [id number] Visit Date: 07/12/2022              Requested by: Hoy Register, MD [address] [place],  [place] [address] PCP: Hoy Register, MD  Chief Complaint  Patient presents with   Left Foot - Pain      HPI: Patient is a pleasant 57 year old woman who comes in today complaining of left foot pain and swelling.  She denies any particular injury.  She has been seeing a podiatrist who has removed some toenails for her.  She last saw him about a month ago.  He did do a sinus tarsi injection which did not give her much relief.  She complains of the pain hurting across the top of her foot.  It is moderate enough that she has to reduce her weightbearing.  She did feel better in the boot but now she is hurting again.  She also has some swelling in both of her legs.  But more so on the left.  She has a history of a ankle fracture dislocation many years ago  Assessment & Plan: Visit Diagnoses:  1. Arthritis of left foot     Plan: Patient has a x-rays previously taken consistent with subtalar and talonavicular arthritis.  She did have an injection with podiatrist which was minimally helpful.  She is here today with a nonsupportive flexible [employer].  She has been using Voltaren gel and Epsom salt soaks.  We discussed orthotics we will give her a pair today as well as a more stiff shoe.  We also discussed compression stockings which she has used in the past but does not seem to think they helped the left foot.  There is no sign of cellulitis or or infection.  She does have a history of smoking and diabetes.  We talked about elevating her feet when she can.  Can follow-up with Dr. Lajoyce Corners in a month.  She does have poor posterior tibial tendon function as well as peroneal function.  She has good dorsiflexion and plantarflexion.  Follow-Up Instructions: Return in about 4 weeks  (around 08/09/2022), or with Dr. Lajoyce Corners.   Ortho Exam  Patient is alert, oriented, no adenopathy, well-dressed, normal affect, normal respiratory effort. Left foot compartments soft and nontender negative Homans' sign no real tenderness over the ankle joint good dorsiflexion and plantarflexion.  Dorsalis pedis pulses easily palpable.  She has no erythema or cellulitis.  She cannot evert or invert her foot.  She is tender over the subtalar joint with some stiffness.  Also acutely tender over the talonavicular joint.  No tenderness in the forefoot or through the Lisfranc joint  Imaging: No results found. No images are attached to the encounter.  Labs: Lab Results  Component Value Date   HGBA1C 6.1 (H) 06/03/2022   HGBA1C 5.9 12/03/2021   HGBA1C 5.9 12/01/2020   REPTSTATUS 03/17/2022 FINAL 03/15/2022   CULT (A) 03/15/2022    <10,000 COLONIES/mL INSIGNIFICANT GROWTH Performed at [facility], [address] [place], [place] [address]    LABORGA ESCHERICHIA COLI (A) 07/08/2020     Lab Results  Component Value Date   ALBUMIN 4.1 06/03/2022   ALBUMIN 3.2 (L) 12/28/2021   ALBUMIN 3.9 12/03/2021    No results found for: "MG" Lab Results  Component Value Date   VD25OH 47 04/13/2013    No results found for: "PREALBUMIN"    Latest Ref Rng & Units 06/03/2022   11:49 AM 12/28/2021    6:38 PM 06/01/2021    9:53 AM  CBC EXTENDED  WBC 3.4 - 10.8 x10E3/uL 7.5  8.9  7.9   RBC 3.77 - 5.28 x10E6/uL 4.17  4.43  4.16   Hemoglobin 11.1 - 15.9 g/dL 16.1  09.6  04.5   HCT 34.0 - 46.6 % 39.8  41.7  38.2   Platelets 150 - 450 x10E3/uL 207  188  246   NEUT# 1.4 - 7.0 x10E3/uL 4.4  5.7  4.9   Lymph# 0.7 - 3.1 x10E3/uL 2.3  1.8  1.9      Body mass index is 29.62 kg/m.  Orders:  No orders of the defined types were placed in this encounter.  No orders of the defined types were placed in this encounter.    Procedures: No procedures performed  Clinical Data: No additional  findings.  ROS:  All other systems negative, except as noted in the HPI. Review of Systems  Objective: Vital Signs: Ht 5\' 5"  (1.651 m)   Wt 178 lb (80.7 kg)   BMI 29.62 kg/m   Specialty Comments:  EXAM: MRI LUMBAR SPINE WITHOUT AND WITH CONTRAST   TECHNIQUE: Multiplanar and multiecho pulse sequences of the lumbar spine were obtained without and with intravenous contrast.   CONTRAST:  15mL MULTIHANCE GADOBENATE DIMEGLUMINE 529 MG/ML IV SOLN   COMPARISON:  01/05/2017   FINDINGS: Segmentation:  5 lumbar type vertebrae   Alignment:  Mild scoliosis.  No listhesis   Vertebrae: No fracture, evidence of discitis, or bone lesion. Endplate sclerosis has developed at L2-3 and L5-S1, degenerative appearing.   Conus medullaris and cauda equina: Conus extends to the L1 level. Conus and cauda equina appear normal.   Paraspinal and other soft tissues: No perispinal mass or inflammation noted.   Disc levels:   T11-12: Chronic disc collapse with ventral protrusion.   T12- L1: Unremarkable.   L1-L2: Disc narrowing and bulging with endplate degeneration. Patent canal and foramina   L2-L3: Disc narrowing and bulging with endplate degeneration. Circumferential disc bulging and facet spurring. Mild or moderate left foraminal narrowing   L3-L4: Disc narrowing and bulging with facet spurring. Patent canal and foramina   L4-L5: Disc narrowing and bulging. Degenerative facet spurring greater on the right. Mild-to-moderate right foraminal narrowing   L5-S1:Disc collapse and endplate degeneration with bulge. Degenerative facet spurring on the right more than left. Biforaminal impingement, worse on the right and progressed.   IMPRESSION: 1. Generalized lumbar spine degeneration with progression from 2018, especially at L5-S1 where there is right more than left foraminal impingement. 2. Diffusely patent spinal canal. 3. Mild scoliosis.     Electronically Signed   By: Tiburcio Pea M.D.   On: 09/30/2020 09:57  PMFS History: Patient Active Problem List   Diagnosis Date Noted   Arthritis of left foot 07/12/2022   Current mild episode of major depressive disorder without prior episode (HCC) 12/03/2021   Aortic atherosclerosis (HCC) 12/03/2021   Chronic respiratory failure with hypoxia (HCC) 03/11/2021   Centrilobular emphysema (HCC) 07/29/2020   Multiple nodules of lung 07/29/2020   Hyperlipidemia 07/18/2017   Ulnar neuropathy 04/18/2017   GERD (gastroesophageal reflux disease) 10/19/2016   Prediabetes 05/03/2016   Cubital tunnel syndrome on right 03/01/2016   Numbness and tingling of right arm 01/01/2016   Chlamydia infection  01/27/2015   Tobacco abuse 12/26/2013   Chronic lower back pain 04/13/2013   Sciatica 04/13/2013   Chronic neck pain 04/13/2013   Spondylosis of cervical spine 04/13/2013   Past Medical History:  Diagnosis Date   Acid reflux    Allergy    Carpal tunnel syndrome    COPD (chronic obstructive pulmonary disease) (HCC)    Depression    Diverticulitis    Hyperlipidemia    Low back pain    Neck pain    Sacroiliac inflammation (HCC)    Sciatica    Seasonal allergies     Family History  Problem Relation Age of Onset   Healthy Mother    Other Father        Unsure of medical history   Asthma Maternal Aunt    Diabetes Maternal Aunt    Cancer Maternal Aunt    Breast cancer Maternal Aunt    Hypertension Maternal Grandmother    Heart Problems Maternal Grandmother    Hypertension Other    COPD Other    Colon cancer Neg Hx    Pancreatic cancer Neg Hx    Rectal cancer Neg Hx    Stomach cancer Neg Hx    Colon polyps Neg Hx    Esophageal cancer Neg Hx     Past Surgical History:  Procedure Laterality Date   ABDOMINAL SURGERY     ANKLE SURGERY     lt.   BREAST EXCISIONAL BIOPSY Right    pt states years ago- not sure when   PARTIAL HYSTERECTOMY     right breast cyst removed      URETHRAL DIVERTICULUM REPAIR     uretrral  diverticultis   Social History   Occupational History   Occupation: Unemployed   Occupation: past Conservation officer, nature, International aid/development worker at [employer], office positions, Landscape architect.  Tobacco Use   Smoking status: Every Day    Packs/day: 0.50    Years: 42.00    Additional pack years: 0.00    Total pack years: 21.00    Types: Cigarettes    Start date: 5    Passive exposure: Past (uses patches, down to 3 cigarettes a day)   Smokeless tobacco: Never   Tobacco comments:    Pt states she smokes about 2 ciggs daily.  Vaping Use   Vaping Use: Never used  Substance and Sexual Activity   Alcohol use: Not Currently    Comment: Social only - seldom   Drug use: No   Sexual activity: Never

## 2022-07-14 ENCOUNTER — Other Ambulatory Visit: Payer: Self-pay | Admitting: Internal Medicine

## 2022-07-14 DIAGNOSIS — G8929 Other chronic pain: Secondary | ICD-10-CM

## 2022-07-14 MED ORDER — LIDOCAINE 5 % EX PTCH
1.0000 | MEDICATED_PATCH | CUTANEOUS | 0 refills | Status: DC
Start: 2022-07-14 — End: 2022-08-17

## 2022-07-25 ENCOUNTER — Other Ambulatory Visit: Payer: Self-pay | Admitting: Internal Medicine

## 2022-07-25 DIAGNOSIS — G8929 Other chronic pain: Secondary | ICD-10-CM

## 2022-08-09 ENCOUNTER — Ambulatory Visit: Payer: Medicaid Other | Admitting: Orthopedic Surgery

## 2022-08-09 ENCOUNTER — Other Ambulatory Visit (INDEPENDENT_AMBULATORY_CARE_PROVIDER_SITE_OTHER): Payer: Medicaid Other

## 2022-08-09 DIAGNOSIS — M19072 Primary osteoarthritis, left ankle and foot: Secondary | ICD-10-CM | POA: Diagnosis not present

## 2022-08-09 DIAGNOSIS — M25561 Pain in right knee: Secondary | ICD-10-CM

## 2022-08-09 DIAGNOSIS — M6702 Short Achilles tendon (acquired), left ankle: Secondary | ICD-10-CM

## 2022-08-10 ENCOUNTER — Other Ambulatory Visit: Payer: Self-pay | Admitting: Family Medicine

## 2022-08-10 ENCOUNTER — Encounter: Payer: Self-pay | Admitting: Orthopedic Surgery

## 2022-08-10 ENCOUNTER — Encounter: Payer: Self-pay | Admitting: Family Medicine

## 2022-08-10 DIAGNOSIS — R600 Localized edema: Secondary | ICD-10-CM

## 2022-08-10 NOTE — Progress Notes (Signed)
Office Visit Note   Patient: Cheryl King           Date of Birth: Jul 31, 1965           MRN: [id number] Visit Date: 08/09/2022              Requested by: Hoy Register, MD [address] [place],  [place] [address] PCP: Hoy Register, MD  Chief Complaint  Patient presents with   Left Foot - Pain   Right Knee - Pain    S/p fall last week      HPI: Patient is a 57 year old woman who presents for 2 separate issues.  #1 she fell last week and has pain over the tibial tubercle where she fell.  #2 she has had left foot chronic pain currently ambulates with a cane.  Assessment & Plan: Visit Diagnoses:  1. Acute pain of right knee   2. Arthritis of left foot     Plan: Patient states that her compression socks have not helped with the swelling in her legs.  Recommend she follow-up with her primary care physician for evaluation of renal and cardiac function.  Recommended Achilles stretching and this was demonstrated to unload the pressure from the forefoot.  Patient can use topical Voltaren gel for the tibial tubercle pain.  Follow-Up Instructions: No follow-ups on file.   Ortho Exam  Patient is alert, oriented, no adenopathy, well-dressed, normal affect, normal respiratory effort. Examination patient ambulates with a cane.  She has callus across the forefoot on the left with her knee extended she has an Achilles contracture 20 degrees short of neutral.  Patient has pitting edema in both lower extremities she states her compression socks do not help she has no venous ulcers.  Patient has no knee effusion on the right.  Collaterals and cruciates are stable she is tender over the tibial tubercle the insertion of the patella tendon she has full active extension with no extensor lag.  Imaging: No results found. No images are attached to the encounter.  Labs: Lab Results  Component Value Date   HGBA1C 6.1 (H) 06/03/2022   HGBA1C 5.9 12/03/2021   HGBA1C 5.9  12/01/2020   REPTSTATUS 03/17/2022 FINAL 03/15/2022   CULT (A) 03/15/2022    <10,000 COLONIES/mL INSIGNIFICANT GROWTH Performed at [facility], [address] [place], [place] [address]    LABORGA ESCHERICHIA COLI (A) 07/08/2020     Lab Results  Component Value Date   ALBUMIN 4.1 06/03/2022   ALBUMIN 3.2 (L) 12/28/2021   ALBUMIN 3.9 12/03/2021    No results found for: "MG" Lab Results  Component Value Date   VD25OH 47 04/13/2013    No results found for: "PREALBUMIN"    Latest Ref Rng & Units 06/03/2022   11:49 AM 12/28/2021    6:38 PM 06/01/2021    9:53 AM  CBC EXTENDED  WBC 3.4 - 10.8 x10E3/uL 7.5  8.9  7.9   RBC 3.77 - 5.28 x10E6/uL 4.17  4.43  4.16   Hemoglobin 11.1 - 15.9 g/dL 40.3  47.4  25.9   HCT 34.0 - 46.6 % 39.8  41.7  38.2   Platelets 150 - 450 x10E3/uL 207  188  246   NEUT# 1.4 - 7.0 x10E3/uL 4.4  5.7  4.9   Lymph# 0.7 - 3.1 x10E3/uL 2.3  1.8  1.9      There is no height or weight on file to calculate BMI.  Orders:  Orders Placed  This Encounter  Procedures   XR Knee 1-2 Views Right   No orders of the defined types were placed in this encounter.    Procedures: No procedures performed  Clinical Data: No additional findings.  ROS:  All other systems negative, except as noted in the HPI. Review of Systems  Objective: Vital Signs: There were no vitals taken for this visit.  Specialty Comments:  EXAM: MRI LUMBAR SPINE WITHOUT AND WITH CONTRAST   TECHNIQUE: Multiplanar and multiecho pulse sequences of the lumbar spine were obtained without and with intravenous contrast.   CONTRAST:  15mL MULTIHANCE GADOBENATE DIMEGLUMINE 529 MG/ML IV SOLN   COMPARISON:  01/05/2017   FINDINGS: Segmentation:  5 lumbar type vertebrae   Alignment:  Mild scoliosis.  No listhesis   Vertebrae: No fracture, evidence of discitis, or bone lesion. Endplate sclerosis has developed at L2-3 and L5-S1, degenerative appearing.   Conus medullaris and  cauda equina: Conus extends to the L1 level. Conus and cauda equina appear normal.   Paraspinal and other soft tissues: No perispinal mass or inflammation noted.   Disc levels:   T11-12: Chronic disc collapse with ventral protrusion.   T12- L1: Unremarkable.   L1-L2: Disc narrowing and bulging with endplate degeneration. Patent canal and foramina   L2-L3: Disc narrowing and bulging with endplate degeneration. Circumferential disc bulging and facet spurring. Mild or moderate left foraminal narrowing   L3-L4: Disc narrowing and bulging with facet spurring. Patent canal and foramina   L4-L5: Disc narrowing and bulging. Degenerative facet spurring greater on the right. Mild-to-moderate right foraminal narrowing   L5-S1:Disc collapse and endplate degeneration with bulge. Degenerative facet spurring on the right more than left. Biforaminal impingement, worse on the right and progressed.   IMPRESSION: 1. Generalized lumbar spine degeneration with progression from 2018, especially at L5-S1 where there is right more than left foraminal impingement. 2. Diffusely patent spinal canal. 3. Mild scoliosis.     Electronically Signed   By: Tiburcio Pea M.D.   On: 09/30/2020 09:57  PMFS History: Patient Active Problem List   Diagnosis Date Noted   Arthritis of left foot 07/12/2022   Current mild episode of major depressive disorder without prior episode (HCC) 12/03/2021   Aortic atherosclerosis (HCC) 12/03/2021   Chronic respiratory failure with hypoxia (HCC) 03/11/2021   Centrilobular emphysema (HCC) 07/29/2020   Multiple nodules of lung 07/29/2020   Hyperlipidemia 07/18/2017   Ulnar neuropathy 04/18/2017   GERD (gastroesophageal reflux disease) 10/19/2016   Prediabetes 05/03/2016   Cubital tunnel syndrome on right 03/01/2016   Numbness and tingling of right arm 01/01/2016   Chlamydia infection 01/27/2015   Tobacco abuse 12/26/2013   Chronic lower back pain 04/13/2013    Sciatica 04/13/2013   Chronic neck pain 04/13/2013   Spondylosis of cervical spine 04/13/2013   Past Medical History:  Diagnosis Date   Acid reflux    Allergy    Carpal tunnel syndrome    COPD (chronic obstructive pulmonary disease) (HCC)    Depression    Diverticulitis    Hyperlipidemia    Low back pain    Neck pain    Sacroiliac inflammation (HCC)    Sciatica    Seasonal allergies     Family History  Problem Relation Age of Onset   Healthy Mother    Other Father        Unsure of medical history   Asthma Maternal Aunt    Diabetes Maternal Aunt    Cancer Maternal Aunt  Breast cancer Maternal Aunt    Hypertension Maternal Grandmother    Heart Problems Maternal Grandmother    Hypertension Other    COPD Other    Colon cancer Neg Hx    Pancreatic cancer Neg Hx    Rectal cancer Neg Hx    Stomach cancer Neg Hx    Colon polyps Neg Hx    Esophageal cancer Neg Hx     Past Surgical History:  Procedure Laterality Date   ABDOMINAL SURGERY     ANKLE SURGERY     lt.   BREAST EXCISIONAL BIOPSY Right    pt states years ago- not sure when   PARTIAL HYSTERECTOMY     right breast cyst removed      URETHRAL DIVERTICULUM REPAIR     uretrral diverticultis   Social History   Occupational History   Occupation: Unemployed   Occupation: past Conservation officer, nature, International aid/development worker at [employer], office positions, Landscape architect.  Tobacco Use   Smoking status: Every Day    Current packs/day: 0.50    Average packs/day: 0.5 packs/day for 43.6 years (21.8 ttl pk-yrs)    Types: Cigarettes    Start date: 22    Passive exposure: Past (uses patches, down to 3 cigarettes a day)   Smokeless tobacco: Never   Tobacco comments:    Pt states she smokes about 2 ciggs daily.  Vaping Use   Vaping status: Never Used  Substance and Sexual Activity   Alcohol use: Not Currently    Comment: Social only - seldom   Drug use: No   Sexual activity: Never

## 2022-08-11 ENCOUNTER — Other Ambulatory Visit: Payer: Self-pay | Admitting: Family Medicine

## 2022-08-11 DIAGNOSIS — Z1231 Encounter for screening mammogram for malignant neoplasm of breast: Secondary | ICD-10-CM

## 2022-08-13 ENCOUNTER — Other Ambulatory Visit: Payer: Self-pay | Admitting: Orthopedic Surgery

## 2022-08-13 ENCOUNTER — Telehealth: Payer: Self-pay | Admitting: Licensed Clinical Social Worker

## 2022-08-13 ENCOUNTER — Ambulatory Visit
Admission: RE | Admit: 2022-08-13 | Discharge: 2022-08-13 | Disposition: A | Discharge status: Home / Self Care | Payer: Medicaid Other | Source: Ambulatory Visit | Attending: Family Medicine | Admitting: Family Medicine

## 2022-08-13 DIAGNOSIS — Z1231 Encounter for screening mammogram for malignant neoplasm of breast: Secondary | ICD-10-CM

## 2022-08-13 NOTE — Telephone Encounter (Signed)
Called pt in regards to her message she left in MyChart. Pt didn't answer, LCSWA left a detailed voicemail.

## 2022-08-16 ENCOUNTER — Other Ambulatory Visit: Payer: Self-pay

## 2022-08-16 ENCOUNTER — Ambulatory Visit: Payer: Medicaid Other | Admitting: Physical Medicine and Rehabilitation

## 2022-08-16 VITALS — BP 97/63 | HR 87

## 2022-08-16 DIAGNOSIS — M5416 Radiculopathy, lumbar region: Secondary | ICD-10-CM | POA: Diagnosis not present

## 2022-08-16 MED ORDER — METHYLPREDNISOLONE ACETATE 80 MG/ML IJ SUSP
80.0000 mg | Freq: Once | INTRAMUSCULAR | Status: AC
  Administered 2022-08-16: 80 mg

## 2022-08-16 NOTE — Progress Notes (Signed)
Cheryl King - 57 y.o. female MRN [id number]  Date of birth: 1966-01-10  Office Visit Note: Visit Date: 08/16/2022 PCP: Hoy Register, MD Referred by: London Sheer, MD  Subjective: Chief Complaint  Patient presents with   Lower Back - Pain   HPI:  Cheryl King is a 57 y.o. female who comes in today at the request of Drucie Opitz for planned Bilateral L5-S1 Lumbar Transforaminal epidural steroid injection with fluoroscopic guidance.  The patient has failed conservative care including home exercise, medications, time and activity modification.  This injection will be diagnostic and hopefully therapeutic.  Please see requesting physician notes for further details and justification.   ROS Otherwise per HPI.  Assessment & Plan: Visit Diagnoses:    ICD-10-CM   1. Lumbar radiculopathy  M54.16 XR C-ARM NO REPORT    Epidural Steroid injection    methylPREDNISolone acetate (DEPO-MEDROL) injection 80 mg      Plan: No additional findings.   Meds & Orders:  Meds ordered this encounter  Medications   methylPREDNISolone acetate (DEPO-MEDROL) injection 80 mg    Orders Placed This Encounter  Procedures   XR C-ARM NO REPORT   Epidural Steroid injection    Follow-up: Return for visit to requesting provider as needed.   Procedures: No procedures performed  Lumbosacral Transforaminal Epidural Steroid Injection - Sub-Pedicular Approach with Fluoroscopic Guidance  Patient: Cheryl King      Date of Birth: 04/23/1965 MRN: [id number] PCP: Hoy Register, MD      Visit Date: 08/16/2022   Universal Protocol:    Date/Time: 08/16/2022  Consent Given By: the patient  Position: PRONE  Additional Comments: Vital signs were monitored before and after the procedure. Patient was prepped and draped in the usual sterile fashion. The correct patient, procedure, and site was verified.   Injection Procedure Details:   Procedure diagnoses: Lumbar radiculopathy  [M54.16]    Meds Administered:  Meds ordered this encounter  Medications   methylPREDNISolone acetate (DEPO-MEDROL) injection 80 mg    Laterality: Bilateral  Location/Site: L5  Needle:5.0 in., 22 ga.  Short bevel or Quincke spinal needle  Needle Placement: Transforaminal  Findings:    -Comments: Excellent flow of contrast along the nerve, nerve root and into the epidural space.  Procedure Details: After squaring off the end-plates to get a true AP view, the C-arm was positioned so that an oblique view of the foramen as noted above was visualized. The target area is just inferior to the "nose of the scotty dog" or sub pedicular. The soft tissues overlying this structure were infiltrated with 2-3 ml. of 1% Lidocaine without Epinephrine.  The spinal needle was inserted toward the target using a "trajectory" view along the fluoroscope beam.  Under AP and lateral visualization, the needle was advanced so it did not puncture dura and was located close the 6 O'Clock position of the pedical in AP tracterory. Biplanar projections were used to confirm position. Aspiration was confirmed to be negative for CSF and/or blood. A 1-2 ml. volume of Isovue-250 was injected and flow of contrast was noted at each level. Radiographs were obtained for documentation purposes.   After attaining the desired flow of contrast documented above, a 0.5 to 1.0 ml test dose of 0.25% Marcaine was injected into each respective transforaminal space.  The patient was observed for 90 seconds post injection.  After no sensory deficits were reported, and normal lower extremity motor function was noted,   the above injectate was administered so that equal amounts  of the injectate were placed at each foramen (level) into the transforaminal epidural space.   Additional Comments:  No complications occurred Dressing: 2 x 2 sterile gauze and Band-Aid    Post-procedure details: Patient was observed during the  procedure. Post-procedure instructions were reviewed.  Patient left the clinic in stable condition.    Clinical History: EXAM: MRI LUMBAR SPINE WITHOUT AND WITH CONTRAST   TECHNIQUE: Multiplanar and multiecho pulse sequences of the lumbar spine were obtained without and with intravenous contrast.   CONTRAST:  15mL MULTIHANCE GADOBENATE DIMEGLUMINE 529 MG/ML IV SOLN   COMPARISON:  01/05/2017   FINDINGS: Segmentation:  5 lumbar type vertebrae   Alignment:  Mild scoliosis.  No listhesis   Vertebrae: No fracture, evidence of discitis, or bone lesion. Endplate sclerosis has developed at L2-3 and L5-S1, degenerative appearing.   Conus medullaris and cauda equina: Conus extends to the L1 level. Conus and cauda equina appear normal.   Paraspinal and other soft tissues: No perispinal mass or inflammation noted.   Disc levels:   T11-12: Chronic disc collapse with ventral protrusion.   T12- L1: Unremarkable.   L1-L2: Disc narrowing and bulging with endplate degeneration. Patent canal and foramina   L2-L3: Disc narrowing and bulging with endplate degeneration. Circumferential disc bulging and facet spurring. Mild or moderate left foraminal narrowing   L3-L4: Disc narrowing and bulging with facet spurring. Patent canal and foramina   L4-L5: Disc narrowing and bulging. Degenerative facet spurring greater on the right. Mild-to-moderate right foraminal narrowing   L5-S1:Disc collapse and endplate degeneration with bulge. Degenerative facet spurring on the right more than left. Biforaminal impingement, worse on the right and progressed.   IMPRESSION: 1. Generalized lumbar spine degeneration with progression from 2018, especially at L5-S1 where there is right more than left foraminal impingement. 2. Diffusely patent spinal canal. 3. Mild scoliosis.     Electronically Signed   By: Tiburcio Pea M.D.   On: 09/30/2020 09:57     Objective:  VS:  HT:    WT:   BMI:      BP:97/63  HR:87bpm  TEMP: ( )  RESP:  Physical Exam Vitals and nursing note reviewed.  Constitutional:      General: She is not in acute distress.    Appearance: Normal appearance. She is not ill-appearing.  HENT:     Head: Normocephalic and atraumatic.     Right Ear: External ear normal.     Left Ear: External ear normal.  Eyes:     Extraocular Movements: Extraocular movements intact.  Cardiovascular:     Rate and Rhythm: Normal rate.     Pulses: Normal pulses.  Pulmonary:     Effort: Pulmonary effort is normal. No respiratory distress.  Abdominal:     General: There is no distension.     Palpations: Abdomen is soft.  Musculoskeletal:        General: Tenderness present.     Cervical back: Neck supple.     Right lower leg: No edema.     Left lower leg: No edema.     Comments: Patient has good distal strength with no pain over the greater trochanters.  No clonus or focal weakness.  Skin:    Findings: No erythema, lesion or rash.  Neurological:     General: No focal deficit present.     Mental Status: She is alert and oriented to person, place, and time.     Sensory: No sensory deficit.     Motor:  No weakness or abnormal muscle tone.     Coordination: Coordination normal.  Psychiatric:        Mood and Affect: Mood normal.        Behavior: Behavior normal.      Imaging: No results found.

## 2022-08-16 NOTE — Procedures (Signed)
Lumbosacral Transforaminal Epidural Steroid Injection - Sub-Pedicular Approach with Fluoroscopic Guidance  Patient: Cheryl King      Date of Birth: January 08, 1966 MRN: [id number] PCP: Hoy Register, MD      Visit Date: 08/16/2022   Universal Protocol:    Date/Time: 08/16/2022  Consent Given By: the patient  Position: PRONE  Additional Comments: Vital signs were monitored before and after the procedure. Patient was prepped and draped in the usual sterile fashion. The correct patient, procedure, and site was verified.   Injection Procedure Details:   Procedure diagnoses: Lumbar radiculopathy [M54.16]    Meds Administered:  Meds ordered this encounter  Medications   methylPREDNISolone acetate (DEPO-MEDROL) injection 80 mg    Laterality: Bilateral  Location/Site: L5  Needle:5.0 in., 22 ga.  Short bevel or Quincke spinal needle  Needle Placement: Transforaminal  Findings:    -Comments: Excellent flow of contrast along the nerve, nerve root and into the epidural space.  Procedure Details: After squaring off the end-plates to get a true AP view, the C-arm was positioned so that an oblique view of the foramen as noted above was visualized. The target area is just inferior to the "nose of the scotty dog" or sub pedicular. The soft tissues overlying this structure were infiltrated with 2-3 ml. of 1% Lidocaine without Epinephrine.  The spinal needle was inserted toward the target using a "trajectory" view along the fluoroscope beam.  Under AP and lateral visualization, the needle was advanced so it did not puncture dura and was located close the 6 O'Clock position of the pedical in AP tracterory. Biplanar projections were used to confirm position. Aspiration was confirmed to be negative for CSF and/or blood. A 1-2 ml. volume of Isovue-250 was injected and flow of contrast was noted at each level. Radiographs were obtained for documentation purposes.   After attaining the  desired flow of contrast documented above, a 0.5 to 1.0 ml test dose of 0.25% Marcaine was injected into each respective transforaminal space.  The patient was observed for 90 seconds post injection.  After no sensory deficits were reported, and normal lower extremity motor function was noted,   the above injectate was administered so that equal amounts of the injectate were placed at each foramen (level) into the transforaminal epidural space.   Additional Comments:  No complications occurred Dressing: 2 x 2 sterile gauze and Band-Aid    Post-procedure details: Patient was observed during the procedure. Post-procedure instructions were reviewed.  Patient left the clinic in stable condition.

## 2022-08-16 NOTE — Patient Instructions (Signed)

## 2022-08-16 NOTE — Progress Notes (Signed)
Functional Pain Scale - descriptive words and definitions  Unmanageable (7)  Pain interferes with normal ADL's/nothing seems to help/sleep is very difficult/active distractions are very difficult to concentrate on. Severe range order  Average Pain 8   +Driver, -BT, -Dye Allergies.  Lower back pain on both sides, unsure if pain in legs is coming from the back

## 2022-08-17 ENCOUNTER — Other Ambulatory Visit: Payer: Self-pay | Admitting: Family Medicine

## 2022-08-17 ENCOUNTER — Other Ambulatory Visit: Payer: Self-pay | Admitting: Internal Medicine

## 2022-08-17 ENCOUNTER — Other Ambulatory Visit (HOSPITAL_BASED_OUTPATIENT_CLINIC_OR_DEPARTMENT_OTHER): Payer: Self-pay | Admitting: Pulmonary Disease

## 2022-08-17 DIAGNOSIS — G8929 Other chronic pain: Secondary | ICD-10-CM

## 2022-08-17 NOTE — Telephone Encounter (Signed)
LMOM for patient to call back to see if this has been worked out or not.

## 2022-08-18 ENCOUNTER — Telehealth: Payer: Self-pay | Admitting: Licensed Clinical Social Worker

## 2022-08-18 ENCOUNTER — Ambulatory Visit: Payer: Medicaid Other | Admitting: Family Medicine

## 2022-08-18 MED ORDER — ESOMEPRAZOLE MAGNESIUM 40 MG PO CPDR: 40.0000 mg | DELAYED_RELEASE_CAPSULE | Freq: Every day | ORAL | 0 refills | Status: DC

## 2022-08-18 MED ORDER — RESTASIS 0.05 % OP EMUL: 1.0000 [drp] | Freq: Every day | OPHTHALMIC | 3 refills | Status: AC

## 2022-08-18 MED ORDER — LIDOCAINE 5 % EX PTCH
1.0000 | MEDICATED_PATCH | CUTANEOUS | 0 refills | Status: AC
Start: 2022-08-18 — End: ?

## 2022-08-18 NOTE — Telephone Encounter (Signed)
Copied from CRM [id number]. Topic: General - Other >> Aug 18, 2022  1:28 PM Dominique A wrote: Reason for CRM: Pt is calling in requesting to speak with Reginia Naas. Pt states that she will try to call back to speak with her, but is needing to talk to her. Please advise.

## 2022-08-21 ENCOUNTER — Other Ambulatory Visit: Payer: Self-pay | Admitting: Family Medicine

## 2022-08-21 DIAGNOSIS — R0982 Postnasal drip: Secondary | ICD-10-CM

## 2022-08-23 ENCOUNTER — Telehealth: Payer: Self-pay | Admitting: Licensed Clinical Social Worker

## 2022-08-23 MED ORDER — TRELEGY ELLIPTA 100-62.5-25 MCG/ACT IN AEPB: 28.0000 | INHALATION_SPRAY | Freq: Every day | RESPIRATORY_TRACT | 0 refills | Status: DC

## 2022-08-23 NOTE — Telephone Encounter (Signed)
Pt was able to to confirm she was indeed is receiving disability, she was concerned about not having it from a previous conversation we had. She stated it is based off the hours that she worked. It is included in her check with her SS. Pt shared that she is looking to for a new home and is working with housing authority because her bills are too high. She is not enjoying living with her mother. Pt shared that she sent me a MyChart message by accident about her chocking but she was able to follow up her provider and schedule an appointment. Pt also shared that she has PTSD and hasn't seen her therapist in a while at [facility], but will call for an appointment.

## 2022-08-23 NOTE — Telephone Encounter (Signed)
Requested Prescriptions  Pending Prescriptions Disp Refills   fluticasone (FLONASE) 50 MCG/ACT nasal spray [Pharmacy Med Name: FLUTICASONE PROP 50 MCG SPRAY] 16 mL 1    Sig: INSTILL 1 SPRAY INTO BOTH NOSTRILS DAILY     Ear, Nose, and Throat: Nasal Preparations - Corticosteroids Passed - 08/21/2022 12:09 PM      Passed - Valid encounter within last 12 months    Recent Outpatient Visits           2 months ago Tobacco abuse   [facility] [place], Odette Horns, MD   6 months ago Chronic respiratory failure with hypoxia [facility]   [facility] Hoy Register, MD   8 months ago Encounter for screening mammogram for malignant neoplasm of breast   [facility] Hoy Register, MD   1 year ago Pedal edema   [facility] Hoy Register, MD   1 year ago Need for shingles vaccine   [facility] Drucilla Chalet, RPH-CPP       Future Appointments             In 2 weeks London Sheer, MD [facility] [place]   In 3 months Hoy Register, MD [facility]

## 2022-09-02 ENCOUNTER — Other Ambulatory Visit: Payer: Self-pay | Admitting: Family Medicine

## 2022-09-02 DIAGNOSIS — G8929 Other chronic pain: Secondary | ICD-10-CM

## 2022-09-07 ENCOUNTER — Other Ambulatory Visit: Payer: Self-pay | Admitting: Cardiology

## 2022-09-07 DIAGNOSIS — E7800 Pure hypercholesterolemia, unspecified: Secondary | ICD-10-CM

## 2022-09-08 ENCOUNTER — Ambulatory Visit: Payer: Medicaid Other | Admitting: Orthopedic Surgery

## 2022-09-08 DIAGNOSIS — M7062 Trochanteric bursitis, left hip: Secondary | ICD-10-CM | POA: Diagnosis not present

## 2022-09-08 DIAGNOSIS — M7061 Trochanteric bursitis, right hip: Secondary | ICD-10-CM | POA: Diagnosis not present

## 2022-09-08 NOTE — Progress Notes (Signed)
Orthopedic Office Note  Patient comes in today with bilateral lateral hip pain.  I had previously done bilateral trochanteric bursa injections in February 2024.  She noted significant relief with those injections and it lasted for about 5 months.  Then pain, has gradually returned.  She feels it in the lateral aspect of both hips.  She notes it is worse if she is laying on that side.  She has more pain on the right side than the left.  She is interested in repeat injections today.  On exam, she is in no acute distress.  She has unlabored breathing on room air.    Bilateral lower extremity exam: She is tender to palpation over the trochanteric bursa on both sides more so on the right than the left.  No pain through range of motion at the hip joint.  Negative Stinchfield.  Negative FABER.  EHL/TA/GSC intact.  Sensation intact to light touch in deep peroneal/superficial peroneal/tibial/saphenous/sural nerve distributions.  Foot warm and well-perfused.  Plan: -Since she did well with bilateral trochanteric injections in the past and is interested in that again, offer that as a repeat treatment for her -No operative intervention at this time -Weightbearing as tolerated- -return to office on an as-needed basis  Bilateral trochanteric injection note: After discussing the risk, benefits, alternatives of bilateral trochanteric bursal injections, patient elected to proceed.  Patient was laying in the lateral position with the left hip up to start.  The area over the trochanteric bursa was prepped with an alcohol based prep.  The skin was anesthetized with ethyl chloride.  A 20-gauge needle was used to sterilely inject 1 cc of bupivacaine, 1 cc of lidocaine, 1 cc of Depo-Medrol.  Needle was withdrawn and Band-Aid was applied.  Patient was then repositioned with the right hip up.  The same procedure was then performed.  Patient tolerated both procedures well.

## 2022-09-14 ENCOUNTER — Other Ambulatory Visit: Payer: Self-pay | Admitting: Family Medicine

## 2022-09-14 DIAGNOSIS — R0982 Postnasal drip: Secondary | ICD-10-CM

## 2022-09-24 ENCOUNTER — Other Ambulatory Visit: Payer: Self-pay | Admitting: Medical Genetics

## 2022-09-24 ENCOUNTER — Telehealth: Payer: Self-pay | Admitting: Adult Health

## 2022-09-24 DIAGNOSIS — Z006 Encounter for examination for normal comparison and control in clinical research program: Secondary | ICD-10-CM

## 2022-09-24 NOTE — Telephone Encounter (Signed)
Trelegy and Cymbocort prescription faxes are needed for [employer]. Fax [phone]

## 2022-09-27 ENCOUNTER — Telehealth: Payer: Self-pay

## 2022-09-27 NOTE — Telephone Encounter (Signed)
Copied from CRM [id number]. Topic: General - Inquiry >> Sep 27, 2022  4:07 PM Marlow Baars wrote: Reason for CRM: Gerarda Gunther from [facility] pharmacy called in to check on status of a fax she sent on Friday and well as today to renew 19 prescriptions for the patient. Please assist further

## 2022-09-29 ENCOUNTER — Other Ambulatory Visit: Payer: Self-pay

## 2022-09-29 DIAGNOSIS — G44229 Chronic tension-type headache, not intractable: Secondary | ICD-10-CM

## 2022-09-29 DIAGNOSIS — G8929 Other chronic pain: Secondary | ICD-10-CM

## 2022-09-29 DIAGNOSIS — R0982 Postnasal drip: Secondary | ICD-10-CM

## 2022-09-29 DIAGNOSIS — N39498 Other specified urinary incontinence: Secondary | ICD-10-CM

## 2022-09-29 MED ORDER — MONTELUKAST SODIUM 10 MG PO TABS: 10.0000 mg | ORAL_TABLET | Freq: Every day | ORAL | 1 refills | Status: DC

## 2022-09-29 MED ORDER — PREGABALIN 75 MG PO CAPS: 75.0000 mg | ORAL_CAPSULE | Freq: Three times a day (TID) | ORAL | 5 refills | Status: DC

## 2022-09-29 MED ORDER — AMITRIPTYLINE HCL 50 MG PO TABS: ORAL_TABLET | Freq: Every day | ORAL | 1 refills | Status: DC

## 2022-09-29 MED ORDER — ROSUVASTATIN CALCIUM 5 MG PO TABS: 5.0000 mg | ORAL_TABLET | Freq: Every day | ORAL | 1 refills | Status: DC

## 2022-09-29 MED ORDER — VARENICLINE TARTRATE 1 MG PO TABS: 1.0000 mg | ORAL_TABLET | Freq: Two times a day (BID) | ORAL | 2 refills | Status: DC

## 2022-09-29 MED ORDER — CYCLOBENZAPRINE HCL 10 MG PO TABS: ORAL_TABLET | Freq: Two times a day (BID) | ORAL | 1 refills | Status: DC | PRN

## 2022-09-29 MED ORDER — DULOXETINE HCL 60 MG PO CPEP: ORAL_CAPSULE | Freq: Every day | ORAL | 1 refills | Status: DC

## 2022-09-29 MED ORDER — OXYBUTYNIN CHLORIDE 5 MG PO TABS: ORAL_TABLET | Freq: Two times a day (BID) | ORAL | 1 refills | Status: DC

## 2022-09-29 NOTE — Telephone Encounter (Signed)
All medication prescribed by Newlin has [facility]   Please [facility].

## 2022-09-29 NOTE — Telephone Encounter (Signed)
Per OV in April she was changed from symbicort to trelegy. Patient states that she picked up trelegy yesterday and at this time does not need any refills.

## 2022-09-30 ENCOUNTER — Other Ambulatory Visit: Payer: Self-pay | Admitting: Orthopedic Surgery

## 2022-10-01 NOTE — Telephone Encounter (Signed)
Ally with [facility] called to f/u provided message below.   Harle Stanford -All medication prescribed by Newlin has been sent in to [facility].

## 2022-10-04 ENCOUNTER — Other Ambulatory Visit: Payer: Self-pay | Admitting: *Deleted

## 2022-10-04 DIAGNOSIS — M7989 Other specified soft tissue disorders: Secondary | ICD-10-CM

## 2022-10-11 ENCOUNTER — Ambulatory Visit (HOSPITAL_COMMUNITY): Payer: Medicaid Other | Attending: Surgery

## 2022-10-15 ENCOUNTER — Other Ambulatory Visit: Payer: Self-pay | Admitting: Pulmonary Disease

## 2022-10-20 ENCOUNTER — Other Ambulatory Visit: Payer: Self-pay | Admitting: Family Medicine

## 2022-10-20 ENCOUNTER — Other Ambulatory Visit (HOSPITAL_BASED_OUTPATIENT_CLINIC_OR_DEPARTMENT_OTHER): Payer: Self-pay | Admitting: Pulmonary Disease

## 2022-10-20 ENCOUNTER — Encounter: Payer: Self-pay | Admitting: Family Medicine

## 2022-10-20 ENCOUNTER — Other Ambulatory Visit: Payer: Self-pay | Admitting: Orthopedic Surgery

## 2022-10-20 DIAGNOSIS — G8929 Other chronic pain: Secondary | ICD-10-CM

## 2022-10-21 ENCOUNTER — Other Ambulatory Visit: Payer: Self-pay | Admitting: Family Medicine

## 2022-10-21 DIAGNOSIS — G8929 Other chronic pain: Secondary | ICD-10-CM

## 2022-10-21 MED ORDER — TRELEGY ELLIPTA 100-62.5-25 MCG/ACT IN AEPB: 28.0000 | INHALATION_SPRAY | Freq: Every day | RESPIRATORY_TRACT | 0 refills | Status: AC

## 2022-10-21 MED ORDER — LIDOCAINE 5 % EX PTCH
1.0000 | MEDICATED_PATCH | CUTANEOUS | 6 refills | Status: DC
Start: 2022-10-21 — End: 2022-12-06

## 2022-10-21 MED ORDER — ACETAMINOPHEN-CODEINE 300-30 MG PO TABS: 1.0000 | ORAL_TABLET | Freq: Four times a day (QID) | ORAL | 0 refills | Status: DC | PRN

## 2022-10-22 ENCOUNTER — Other Ambulatory Visit (HOSPITAL_COMMUNITY): Payer: Self-pay | Admitting: Geriatric Medicine

## 2022-10-22 DIAGNOSIS — R4702 Dysphasia: Secondary | ICD-10-CM

## 2022-10-22 MED ORDER — ESOMEPRAZOLE MAGNESIUM 40 MG PO CPDR: 40.0000 mg | DELAYED_RELEASE_CAPSULE | Freq: Every day | ORAL | 0 refills | Status: DC

## 2022-10-25 ENCOUNTER — Telehealth (HOSPITAL_COMMUNITY): Payer: Self-pay | Admitting: *Deleted

## 2022-10-25 NOTE — Telephone Encounter (Signed)
Attempted to contact patient to schedule OP MBS. Left VM. RKEEL

## 2022-11-02 ENCOUNTER — Other Ambulatory Visit (HOSPITAL_COMMUNITY): Payer: Self-pay | Admitting: *Deleted

## 2022-11-02 DIAGNOSIS — R1310 Dysphagia, unspecified: Secondary | ICD-10-CM

## 2022-11-03 ENCOUNTER — Ambulatory Visit (HOSPITAL_COMMUNITY)
Admission: RE | Admit: 2022-11-03 | Discharge: 2022-11-03 | Disposition: A | Discharge status: Home / Self Care | Payer: Medicaid Other | Source: Ambulatory Visit | Attending: Geriatric Medicine | Admitting: Geriatric Medicine

## 2022-11-03 ENCOUNTER — Other Ambulatory Visit (HOSPITAL_COMMUNITY): Payer: Self-pay | Admitting: Geriatric Medicine

## 2022-11-03 ENCOUNTER — Ambulatory Visit (HOSPITAL_COMMUNITY)
Admission: RE | Admit: 2022-11-03 | Discharge: 2022-11-03 | Disposition: A | Discharge status: Home / Self Care | Payer: Medicaid Other | Source: Ambulatory Visit | Attending: Family Medicine | Admitting: Family Medicine

## 2022-11-03 DIAGNOSIS — R4702 Dysphasia: Secondary | ICD-10-CM | POA: Diagnosis present

## 2022-11-03 DIAGNOSIS — R1310 Dysphagia, unspecified: Secondary | ICD-10-CM | POA: Insufficient documentation

## 2022-11-04 ENCOUNTER — Encounter: Payer: Self-pay | Admitting: Podiatry

## 2022-11-04 ENCOUNTER — Ambulatory Visit: Payer: Medicaid Other | Admitting: Podiatry

## 2022-11-04 DIAGNOSIS — M7752 Other enthesopathy of left foot: Secondary | ICD-10-CM | POA: Diagnosis not present

## 2022-11-08 ENCOUNTER — Other Ambulatory Visit: Payer: Self-pay | Admitting: *Deleted

## 2022-11-08 NOTE — Progress Notes (Signed)
Subjective:   Patient ID: Cheryl King, female   DOB: 57 y.o.   MRN: [id number]   HPI Patient presents about concerns about her left ankle concerns about calluses and feels instability does not have the pain she had previously   ROS      Objective:  Physical Exam  Neurovascular status intact with range of motion currently that is adequate but I did not note any excessive inversion moderate eversion.  Patient has some calluses which also may be byproducts of this but the pain at this point is mild and not severe     Assessment:  Inflammatory condition that is related more towards structure and I do think is more towards gait process     Plan:  H&P reviewed and I do not recommend any more aggressive treatment except for supportive shoes and possible above ankle shoe gear.  We went over all this spent time and patient will be seen back as needed may require further injections possible MRI if symptoms persist

## 2022-11-10 ENCOUNTER — Ambulatory Visit: Payer: Medicaid Other | Admitting: Podiatry

## 2022-11-16 ENCOUNTER — Other Ambulatory Visit: Payer: Self-pay | Admitting: Family Medicine

## 2022-11-16 DIAGNOSIS — N39498 Other specified urinary incontinence: Secondary | ICD-10-CM

## 2022-11-18 ENCOUNTER — Other Ambulatory Visit (HOSPITAL_COMMUNITY)
Admission: RE | Admit: 2022-11-18 | Discharge: 2022-11-18 | Disposition: A | Discharge status: Home / Self Care | Payer: Medicaid Other | Source: Ambulatory Visit | Attending: Oncology | Admitting: Oncology

## 2022-11-18 DIAGNOSIS — Z006 Encounter for examination for normal comparison and control in clinical research program: Secondary | ICD-10-CM | POA: Insufficient documentation

## 2022-11-25 ENCOUNTER — Other Ambulatory Visit (INDEPENDENT_AMBULATORY_CARE_PROVIDER_SITE_OTHER): Payer: Medicaid Other

## 2022-11-25 ENCOUNTER — Ambulatory Visit: Payer: Medicaid Other | Admitting: Orthopedic Surgery

## 2022-11-25 DIAGNOSIS — M25572 Pain in left ankle and joints of left foot: Secondary | ICD-10-CM

## 2022-11-25 DIAGNOSIS — M25561 Pain in right knee: Secondary | ICD-10-CM

## 2022-11-25 DIAGNOSIS — M23321 Other meniscus derangements, posterior horn of medial meniscus, right knee: Secondary | ICD-10-CM | POA: Diagnosis not present

## 2022-11-25 NOTE — Progress Notes (Signed)
Orthopedic Office Note  Patient comes in today with multiple complaints.  She is having right knee and left ankle pain.  She said the knee pain started about 6 weeks ago after a fall in the garage.  She fell to the ground and twisted her knee.  She is felt pain on the medial aspect of her knee since then.  She initially noticed some swelling in the knee but that has improved.  She has been using a cane to help with the pain.  She has been having difficulty with ambulating and has nearly fallen several times as a result of the pain in her knee.  The knee does not lock click or catch.  She also is reporting left ankle pain.  She has had this pain since May 2024.  She has a history of a left ankle fracture and have the fixation removed because of pain associated with the fixation.  She said pain has gotten gradually worse in the ankle with time.  She feels it over the anterior aspect of the ankle.  She notes it with weightbearing.  She said she has noticed some swelling over the anterior aspect of the ankle.  She does not recall any specific trauma or injury in May 2024 that preceded the onset of the pain in the ankle.  Finally, she comes in with bilateral lateral hip pain.  I had previously seen her in the office about this and had done trochanteric injections.  She said she gets significant relief with those injections but pain eventually returns.  She was interested in doing repeat injections today in the office.  On exam: Patient is alert and orientated, answering questions appropriately, following commands, unlabored breathing on room air, no acute distress  Right knee exam: Pain with McMurray but no palpable click, TTP over the medial joint line, small effusion, negative Lachman, negative posterior drawer, knee stable to varus and valgus stress, knee ROM from 0-130, EHL/TA/GSC intact, sensation intact light touch in sural/saphenous/deep peroneal/superficial peroneal/tibial nerve distributions, foot warm  well-perfused  Left ankle exam: TTP over the anterior aspect of the ankle, no other tenderness palpation, no effusion palpated, pain reproduced with dorsiflexion past neutral, no pain with plantarflexion, plantarflexion to 30 degrees, dorsiflexion to 5 degrees  Bilateral hip exam: TTP over the greater trochanter in the area of the bursa  Plan:  -Given concern for meniscus tear that is causing her to fall or nearly fall, recommended an MRI of the right knee to evaluate for meniscus tear -For her left ankle, recommended tylenol 1000mg  TID. She should elevate and ice when able. I also provided her with a lace up ankle brace. Could consider injection in the future -For her bilateral trochanteric bursitis, she wanted to do repeat injections since they have been helping so that was done today in the office (see procedure note below) -She should return to the office in 4 weeks to go over the MRI and decide on next steps   Bilateral trochanteric injection procedure note: After discussing the risk, benefits, and alternatives of bilateral trochanteric bursa injections, patient elected to proceed.  The patient was in the lateral decubitus position with the right hip up.  The skin over the trochanteric bursa was prepped with an alcohol based prep.  The skin was anesthetized with ethyl chloride.  A 20-gauge needle was used to inject 1 cc of lidocaine, 1 cc of bupivacaine, 1 cc of Depo-Medrol under standard sterile technique.  Needle was withdrawn and Band-Aid was applied.  The patient was then repositioned into the lateral decubitus position with the left hip up.  The same steps were then repeated to provide her with a left trochanteric bursa injection.  A Band-Aid was applied on that side.  Patient tolerated the procedure well.  London Sheer, MD Orthopedic Surgeon

## 2022-11-27 LAB — HELIX MOLECULAR SCREEN: Genetic Analysis Overall Interpretation: NEGATIVE

## 2022-11-27 LAB — GENECONNECT MOLECULAR SCREEN

## 2022-12-01 ENCOUNTER — Telehealth: Payer: Self-pay | Admitting: Pulmonary Disease

## 2022-12-01 NOTE — Telephone Encounter (Signed)
Cheryl King states needs to discuss oxygen at appointment 12/10/2022 with Dr. Vassie Loll. Cheryl King phone number is [phone].

## 2022-12-02 ENCOUNTER — Ambulatory Visit: Payer: Medicaid Other | Admitting: Orthopedic Surgery

## 2022-12-02 NOTE — Telephone Encounter (Signed)
Routing to Dr Vassie Loll to make him aware the o2 use needs to discussed and entered into upcoming ov note

## 2022-12-06 ENCOUNTER — Encounter: Payer: Self-pay | Admitting: Family Medicine

## 2022-12-06 ENCOUNTER — Other Ambulatory Visit: Payer: Self-pay | Admitting: Orthopedic Surgery

## 2022-12-06 ENCOUNTER — Ambulatory Visit: Payer: Medicaid Other | Attending: Family Medicine | Admitting: Family Medicine

## 2022-12-06 VITALS — BP 109/73 | HR 83 | Ht 65.0 in | Wt 183.0 lb

## 2022-12-06 DIAGNOSIS — M5441 Lumbago with sciatica, right side: Secondary | ICD-10-CM

## 2022-12-06 DIAGNOSIS — R7303 Prediabetes: Secondary | ICD-10-CM

## 2022-12-06 DIAGNOSIS — G44229 Chronic tension-type headache, not intractable: Secondary | ICD-10-CM

## 2022-12-06 DIAGNOSIS — E7800 Pure hypercholesterolemia, unspecified: Secondary | ICD-10-CM

## 2022-12-06 DIAGNOSIS — N39498 Other specified urinary incontinence: Secondary | ICD-10-CM

## 2022-12-06 DIAGNOSIS — M5442 Lumbago with sciatica, left side: Secondary | ICD-10-CM

## 2022-12-06 DIAGNOSIS — F320 Major depressive disorder, single episode, mild: Secondary | ICD-10-CM

## 2022-12-06 DIAGNOSIS — G8929 Other chronic pain: Secondary | ICD-10-CM

## 2022-12-06 DIAGNOSIS — R296 Repeated falls: Secondary | ICD-10-CM

## 2022-12-06 LAB — POCT GLYCOSYLATED HEMOGLOBIN (HGB A1C): HbA1c, POC (prediabetic range): 5.7 % (ref 5.7–6.4)

## 2022-12-06 MED ORDER — ESOMEPRAZOLE MAGNESIUM 40 MG PO CPDR: 40.0000 mg | DELAYED_RELEASE_CAPSULE | Freq: Every day | ORAL | 1 refills | Status: DC

## 2022-12-06 MED ORDER — MONTELUKAST SODIUM 10 MG PO TABS: 10.0000 mg | ORAL_TABLET | Freq: Every day | ORAL | 1 refills | Status: DC

## 2022-12-06 MED ORDER — LIDOCAINE 5 % EX PTCH: 1.0000 | MEDICATED_PATCH | CUTANEOUS | 6 refills | Status: DC

## 2022-12-06 MED ORDER — OXYBUTYNIN CHLORIDE 5 MG PO TABS: ORAL_TABLET | Freq: Two times a day (BID) | ORAL | 1 refills | Status: DC

## 2022-12-06 MED ORDER — CYCLOBENZAPRINE HCL 10 MG PO TABS
ORAL_TABLET | Freq: Two times a day (BID) | ORAL | 1 refills | Status: DC | PRN
Start: 2022-12-06 — End: 2023-03-18

## 2022-12-06 MED ORDER — AMITRIPTYLINE HCL 50 MG PO TABS: ORAL_TABLET | Freq: Every day | ORAL | 1 refills | Status: DC

## 2022-12-06 MED ORDER — DULOXETINE HCL 60 MG PO CPEP: ORAL_CAPSULE | Freq: Every day | ORAL | 1 refills | Status: DC

## 2022-12-06 MED ORDER — ROSUVASTATIN CALCIUM 5 MG PO TABS: 5.0000 mg | ORAL_TABLET | Freq: Every day | ORAL | 1 refills | Status: DC

## 2022-12-06 NOTE — Patient Instructions (Signed)
VISIT SUMMARY:  During today's visit, we discussed your low oxygen levels, recent falls, chronic back pain, cholesterol management, prediabetes, and feelings of depression. We also reviewed your general health maintenance and administered the influenza vaccine.  YOUR PLAN:  -CHRONIC OBSTRUCTIVE PULMONARY DISEASE (COPD): COPD is a chronic lung disease that makes it hard to breathe. Your oxygen levels were low today, so continue using your oxygen machine at 3 liters as tolerated until you see your pulmonologist. Please address any equipment issues with your oxygen supplier.  -FALLS: You have had two recent falls, one causing a knee injury. Continue following up with your orthopedic specialist for your knee. We may need to evaluate your fall risk and consider fall prevention strategies.  -CHRONIC BACK PAIN: Your chronic back pain has not improved despite recent injections. Continue your current pain management medications, Lyrica and Duloxetine. If your pain does not improve, consider re-evaluating with your pain specialist.  -HYPERLIPIDEMIA: Hyperlipidemia means you have high cholesterol levels. Continue taking Crestor as prescribed. We will order a lipid panel to check your current cholesterol levels.  -PREDIABETES: Prediabetes means your blood sugar levels are higher than normal but not high enough to be classified as diabetes. We will check your A1c today to monitor your blood sugar levels.  -DEPRESSION: You have reported feelings of depression and occasional thoughts of being better off dead. We will refer you to a social worker for immediate support. Continue your current medications.  -GENERAL HEALTH MAINTENANCE: We administered your influenza vaccine today and ordered a comprehensive metabolic panel to check your kidney and liver function.  INSTRUCTIONS:  Please follow up with your pulmonologist later this week for further evaluation of your oxygen therapy. Continue your follow-up with the  orthopedic specialist for your knee injury. We will check your A1c today to monitor your blood sugar levels. Additionally, we will refer you to a social worker for immediate support regarding your feelings of depression.

## 2022-12-06 NOTE — Progress Notes (Signed)
Subjective:  Patient ID: Cheryl King, female    DOB: [birth year]  Age: 57 y.o. MRN: [id number]  CC: Medical Management of Chronic Issues (2 recent falls.)   HPI [name] is a 57 y.o. year old female with a history of Prediabetes (diet controlled A1c 5.9) chronic low back pain with sciatica, GERD, hyperlipidemia,  Emphysema (on 4L oxygen) tobacco abuse.   Interval History: Discussed the use of AI scribe software for clinical note transcription with the patient, who gave verbal consent to proceed.  She presents with hypoxia and recent falls. She reports that her oxygen machine is currently set at three liters, but she has been advised that it is not powerful enough to be set at four liters and needs to be replaced. She is due to see her pulmonologist later in the week. Endorses adherence with her inhalers for her COPD but unfortunately she continues to smoke.  The patient has experienced two falls recently. The first occurred when leaving her aunt's house, resulting in a knee injury that is currently being investigated by an orthopedic doctor. The second fall happened on her mother's stairs, which she attributes to slippery slippers. This fall resulted in a bruise on her backside.  Her knee is being evaluated by her orthopedic.  Her back pain remains unchanged, despite receiving shots for it a couple of months ago.  She remains on Lyrica, Cymbalta, Flexeril.  The patient is currently dealing with the recent death of her grandmother and the stress of living with her mother while trying to find her own place. She also expresses feelings of not wanting to be here, which she attributes to the stress and memories associated with living at her mother's house.  She is on Cymbalta for depression but denies any active plan to commit suicide.       Past Medical History:  Diagnosis Date   Acid reflux    Allergy    Carpal tunnel syndrome    COPD (chronic obstructive pulmonary  disease) (HCC)    Depression    Diverticulitis    Hyperlipidemia    Low back pain    Neck pain    Sacroiliac inflammation (HCC)    Sciatica    Seasonal allergies     Past Surgical History:  Procedure Laterality Date   ABDOMINAL SURGERY     ANKLE SURGERY     lt.   BREAST EXCISIONAL BIOPSY Right    pt states years ago- not sure when   PARTIAL HYSTERECTOMY     right breast cyst removed      URETHRAL DIVERTICULUM REPAIR     uretrral diverticultis    Family History  Problem Relation Age of Onset   Healthy Mother    Other Father        Unsure of medical history   Asthma Maternal Aunt    Diabetes Maternal Aunt    Cancer Maternal Aunt    Breast cancer Maternal Aunt    Hypertension Maternal Grandmother    Heart Problems Maternal Grandmother    Hypertension Other    COPD Other    Colon cancer Neg Hx    Pancreatic cancer Neg Hx    Rectal cancer Neg Hx    Stomach cancer Neg Hx    Colon polyps Neg Hx    Esophageal cancer Neg Hx     Social History   Socioeconomic History   Marital status: Married    Spouse name: Iantha Fallen   Number of children: 0  Years of education: Bachelors   Highest education level: Bachelor's degree (e.g., BA, AB, BS)  Occupational History   Occupation: Unemployed   Occupation: past Conservation officer, nature, International aid/development worker at [employer], office positions, Landscape architect.  Tobacco Use   Smoking status: Every Day    Current packs/day: 0.50    Average packs/day: 0.5 packs/day for 43.9 years (21.9 ttl pk-yrs)    Types: Cigarettes    Start date: 17    Passive exposure: Past (uses patches, down to 3 cigarettes a day)   Smokeless tobacco: Never   Tobacco comments:    Pt states she smokes about 2 ciggs daily.  Vaping Use   Vaping status: Never Used  Substance and Sexual Activity   Alcohol use: Not Currently    Comment: Social only - seldom   Drug use: No   Sexual activity: Never  Other Topics Concern   Not on file  Social History Narrative    Lives at home alone.   Right-handed.   Occasional caffeine use.   Social Determinants of Health   Financial Resource Strain: High Risk (12/05/2022)   Overall Financial Resource Strain (CARDIA)    Difficulty of Paying Living Expenses: Very hard  Food Insecurity: No Food Insecurity (08/14/2022)   Hunger Vital Sign    Worried About Running Out of Food in the Last Year: Never true    Ran Out of Food in the Last Year: Never true  Transportation Needs: No Transportation Needs (08/14/2022)   PRAPARE - Administrator, Civil Service (Medical): No    Lack of Transportation (Non-Medical): No  Physical Activity: Unknown (08/14/2022)   Exercise Vital Sign    Days of Exercise per Week: 0 days    Minutes of Exercise per Session: Not on file  Stress: Stress Concern Present (08/14/2022)   [employer] of Occupational Health - Occupational Stress Questionnaire    Feeling of Stress : Very much  Social Connections: Socially Isolated (08/14/2022)   Social Connection and Isolation Panel [NHANES]    Frequency of Communication with Friends and Family: More than three times a week    Frequency of Social Gatherings with Friends and Family: Once a week    Attends Religious Services: Never    Database administrator or Organizations: No    Attends Engineer, structural: Not on file    Marital Status: Separated    Allergies  Allergen Reactions   Percocet [Oxycodone-Acetaminophen] Nausea Only   Vicodin [Hydrocodone-Acetaminophen] Nausea Only   Penicillins Rash    Outpatient Medications Prior to Visit  Medication Sig Dispense Refill   acetaminophen-codeine (TYLENOL #3) 300-30 MG tablet Take 1 tablet by mouth every 6 (six) hours as needed for moderate pain or severe pain. 20 tablet 0   Azelastine HCl 137 MCG/SPRAY SOLN Place 1 spray into both nostrils daily. 30 mL 6   cetirizine (ZYRTEC) 10 MG tablet Take 1 tablet (10 mg total) by mouth daily. 90 tablet 1   diazepam (VALIUM) 5 MG  tablet Take 1 by mouth 1 hour  pre-procedure with very light food. May bring 2nd tablet to appointment. 2 tablet 0   diclofenac sodium (VOLTAREN) 1 % GEL Apply 2 g topically 4 (four) times daily. 5 Tube 2   EYSUVIS 0.25 % SUSP Apply 1 drop to eye 4 (four) times daily.     ezetimibe (ZETIA) 10 MG tablet TAKE 1 TABLET BY MOUTH DAILY AFTER SUPPER 90 tablet 1   fluconazole (DIFLUCAN) 150 MG tablet  Take 1 tablet (150 mg total) by mouth once a week. 2 tablet 0   fluticasone (FLONASE) 50 MCG/ACT nasal spray INSTILL 1 SPRAY INTO BOTH NOSTRILS DAILY 48 mL 0   Fluticasone-Umeclidin-Vilant (TRELEGY ELLIPTA) 100-62.5-25 MCG/ACT AEPB Inhale 1 puff into the lungs daily. 1 each 5   Fluticasone-Umeclidin-Vilant (TRELEGY ELLIPTA) 100-62.5-25 MCG/ACT AEPB Inhale 28 each into the lungs daily. 28 each 0   Lactobacillus CAPS Take 1 capsule by mouth daily.     miconazole (MICATIN) 2 % cream Apply 1 application topically 2 (two) times daily. 28.35 g 0   Misc. Devices MISC 1. Rolling walker with seat 2. Shower chair 3. Elevated toilet seat.   Diagnosis-chronic back pain 1 each 0   Misc. Devices MISC Compression stocking.  Diagnosis pedal edema Fax to [phone] [facility] 2 each 0   Multiple Vitamin (MULTIVITAMIN WITH MINERALS) TABS tablet Take 1 tablet by mouth daily.     nicotine (NICODERM CQ - DOSED IN MG/24 HOURS) 21 mg/24hr patch Place 1 patch (21 mg total) onto the skin daily. Then decrease to 14mg /24 hr 28 patch 2   nitrofurantoin, macrocrystal-monohydrate, (MACROBID) 100 MG capsule Take 1 capsule (100 mg total) by mouth 2 (two) times daily. 10 capsule 0   polyethylene glycol powder (GLYCOLAX/MIRALAX) 17 GM/SCOOP powder Take 17 g by mouth daily. 3350 g 1   pregabalin (LYRICA) 75 MG capsule Take 1 capsule (75 mg total) by mouth 3 (three) times daily. 90 capsule 5   RESTASIS 0.05 % ophthalmic emulsion Place 1 drop into both eyes daily. 0.4 mL 3   triamcinolone cream (KENALOG) 0.1 % Apply 1 application  topically 2 (two) times daily. 45 g 1   varenicline (CHANTIX) 1 MG tablet Take 1 tablet (1 mg total) by mouth 2 (two) times daily. 60 tablet 2   VENTOLIN HFA 108 (90 Base) MCG/ACT inhaler TAKE 2 PUFFS BY MOUTH EVERY 6 HOURS AS NEEDED FOR WHEEZE OR SHORTNESS OF BREATH 18 each 5   amitriptyline (ELAVIL) 50 MG tablet TAKE 1 TABLET (50 MG TOTAL) BY MOUTH AT BEDTIME. 90 tablet 1   cyclobenzaprine (FLEXERIL) 10 MG tablet TAKE 1 TABLET (10 MG TOTAL) BY MOUTH 2 (TWO) TIMES DAILY AS NEEDED FOR MUSCLE SPASMS. 180 tablet 1   DULoxetine (CYMBALTA) 60 MG capsule TAKE 1 CAPSULE (60 MG TOTAL) BY MOUTH DAILY. 90 capsule 1   esomeprazole (NEXIUM) 40 MG capsule Take 1 capsule (40 mg total) by mouth daily. 90 capsule 0   lidocaine (LIDODERM) 5 % Place 1 patch onto the skin daily. Remove & Discard patch within 12 hours or as directed by MD 30 patch 6   montelukast (SINGULAIR) 10 MG tablet Take 1 tablet (10 mg total) by mouth daily. 90 tablet 1   oxybutynin (DITROPAN) 5 MG tablet TAKE 1 TABLET (5 MG TOTAL) BY MOUTH 2 (TWO) TIMES DAILY. 180 tablet 1   rosuvastatin (CRESTOR) 5 MG tablet Take 1 tablet (5 mg total) by mouth daily. 90 tablet 1   phenazopyridine (PYRIDIUM) 200 MG tablet Take 1 tablet (200 mg total) by mouth 3 (three) times daily as needed for pain. (Patient not taking: Reported on 12/06/2022) 20 tablet 0   0.9 %  sodium chloride infusion      No facility-administered medications prior to visit.     ROS Review of Systems  Constitutional:  Negative for activity change and appetite change.  HENT:  Negative for sinus pressure and sore throat.   Respiratory:  Negative for  chest tightness, shortness of breath and wheezing.   Cardiovascular:  Negative for chest pain and palpitations.  Gastrointestinal:  Negative for abdominal distention, abdominal pain and constipation.  Genitourinary: Negative.   Musculoskeletal:        See HPI  Psychiatric/Behavioral:  Negative for behavioral problems and dysphoric  mood.     Objective:  BP 109/73   Pulse 83   Ht 5\' 5"  (1.651 m)   Wt 183 lb (83 kg)   SpO2 95%   BMI 30.45 kg/m      12/06/2022   11:16 AM 08/16/2022    8:46 AM 07/12/2022    9:24 AM  BP/Weight  Systolic BP 109 97   Diastolic BP 73 63   Wt. (Lbs) 183  178  BMI 30.45 kg/m2  29.62 kg/m2      Physical Exam Constitutional:      Appearance: She is well-developed.  HENT:     Nose:     Comments: On 3 L of oxygen via nasal cannula Cardiovascular:     Rate and Rhythm: Normal rate.     Heart sounds: Normal heart sounds. No murmur heard. Pulmonary:     Effort: Pulmonary effort is normal.     Breath sounds: Normal breath sounds. No wheezing or rales.  Chest:     Chest wall: No tenderness.  Abdominal:     General: Bowel sounds are normal. There is no distension.     Palpations: Abdomen is soft. There is no mass.     Tenderness: There is no abdominal tenderness.  Musculoskeletal:        General: No tenderness. Normal range of motion.     Right lower leg: No edema.     Left lower leg: No edema.  Neurological:     Mental Status: She is alert and oriented to person, place, and time.  Psychiatric:     Comments: Dysphoric mood        Latest Ref Rng & Units 06/03/2022   11:49 AM 12/28/2021    6:38 PM 12/03/2021    9:57 AM  CMP  Glucose 70 - 99 mg/dL 80  657  85   BUN 6 - 24 mg/dL 14  13  11    Creatinine 0.57 - 1.00 mg/dL 8.46  9.62  9.52   Sodium 134 - 144 mmol/L 140  137  143   Potassium 3.5 - 5.2 mmol/L 4.6  3.9  4.6   Chloride 96 - 106 mmol/L 101  105  104   CO2 20 - 29 mmol/L 24  25  27    Calcium 8.7 - 10.2 mg/dL 9.4  9.0  9.6   Total Protein 6.0 - 8.5 g/dL 6.7  7.1  7.0   Total Bilirubin 0.0 - 1.2 mg/dL 0.3  0.3  0.3   Alkaline Phos 44 - 121 IU/L 126  94  121   AST 0 - 40 IU/L 28  29  27    ALT 0 - 32 IU/L 22  22  16      Lipid Panel     Component Value Date/Time   CHOL 170 06/03/2022 1149   TRIG 71 06/03/2022 1149   HDL 72 06/03/2022 1149   CHOLHDL 3.2  01/28/2020 1012   CHOLHDL 3.2 12/26/2013 1319   VLDL 18 12/26/2013 1319   LDLCALC 85 06/03/2022 1149    CBC    Component Value Date/Time   WBC 7.5 06/03/2022 1149   WBC 8.9 12/28/2021 1838   RBC 4.17 06/03/2022  1149   RBC 4.43 12/28/2021 1838   HGB 13.2 06/03/2022 1149   HCT 39.8 06/03/2022 1149   PLT 207 06/03/2022 1149   MCV 95 06/03/2022 1149   MCH 31.7 06/03/2022 1149   MCH 31.4 12/28/2021 1838   MCHC 33.2 06/03/2022 1149   MCHC 33.3 12/28/2021 1838   RDW 13.8 06/03/2022 1149   LYMPHSABS 2.3 06/03/2022 1149   MONOABS 1.2 (H) 12/28/2021 1838   EOSABS 0.1 06/03/2022 1149   BASOSABS 0.0 06/03/2022 1149    Lab Results  Component Value Date   HGBA1C 5.7 12/06/2022    Assessment & Plan:      Chronic Obstructive Pulmonary Disease (COPD) Oxygen saturation initially 87 on room air but repeat performed with oxygen at 3 L was 95%.  P-Continue current oxygen therapy at 3L as tolerated until further evaluation by pulmonologist. -Address equipment issues with DME company. -Continue with inhalers  Falls Two recent falls, one resulting in knee injury. Causes of falls unclear but may be related to environmental hazards and inappropriate footwear. -Continue follow-up with orthopedic specialist for knee injury. -Consider evaluation for fall risk and implementation of fall prevention strategies.  Chronic Back Pain Persistent despite recent injections.  -Continue current pain management regimen including Lyrica and Duloxetine. -Consider re-evaluation by pain specialist if pain persists or worsens.  Hyperlipidemia Patient is on Crestor for cholesterol management. -Continue Crestor as prescribed. -Order lipid panel to assess current cholesterol levels.  Prediabetes Patient has history of prediabetes and recent steroid use for bursitis. -Check A1c today -A1c is 5.7 -Continue lifestyle modification to prevent progression to type 2 diabetes mellitus  Depression Patient reports  occasional thoughts of being better off dead, indicating possible worsening of depression despite current treatment with Cymbalta and Amitriptyline. -She has no active plan -Psychosocial and living situation contributing -Refer to social worker for immediate evaluation and support. -Consider re-evaluation of current psychiatric medication regimen.  General Health Maintenance -Administer influenza vaccine today. -Order comprehensive metabolic panel to assess kidney and liver function.          Meds ordered this encounter  Medications   amitriptyline (ELAVIL) 50 MG tablet    Sig: TAKE 1 TABLET (50 MG TOTAL) BY MOUTH AT BEDTIME.    Dispense:  90 tablet    Refill:  1   cyclobenzaprine (FLEXERIL) 10 MG tablet    Sig: TAKE 1 TABLET (10 MG TOTAL) BY MOUTH 2 (TWO) TIMES DAILY AS NEEDED FOR MUSCLE SPASMS.    Dispense:  180 tablet    Refill:  1   DULoxetine (CYMBALTA) 60 MG capsule    Sig: TAKE 1 CAPSULE (60 MG TOTAL) BY MOUTH DAILY.    Dispense:  90 capsule    Refill:  1   esomeprazole (NEXIUM) 40 MG capsule    Sig: Take 1 capsule (40 mg total) by mouth daily.    Dispense:  90 capsule    Refill:  1   lidocaine (LIDODERM) 5 %    Sig: Place 1 patch onto the skin daily. Remove & Discard patch within 12 hours or as directed by MD    Dispense:  30 patch    Refill:  6   montelukast (SINGULAIR) 10 MG tablet    Sig: Take 1 tablet (10 mg total) by mouth daily.    Dispense:  90 tablet    Refill:  1   oxybutynin (DITROPAN) 5 MG tablet    Sig: TAKE 1 TABLET (5 MG TOTAL) BY MOUTH 2 (TWO) TIMES DAILY.  Dispense:  180 tablet    Refill:  1   rosuvastatin (CRESTOR) 5 MG tablet    Sig: Take 1 tablet (5 mg total) by mouth daily.    Dispense:  90 tablet    Refill:  1    Follow-up: Return in about 6 months (around 06/05/2023) for Chronic medical conditions.       Hoy Register, MD, FAAFP. [facility] [place], [place] [phone]   12/06/2022, 1:19  PM

## 2022-12-07 ENCOUNTER — Encounter: Payer: Self-pay | Admitting: Family Medicine

## 2022-12-07 LAB — CMP14+EGFR
ALT: 14 [IU]/L (ref 0–32)
AST: 22 [IU]/L (ref 0–40)
Albumin: 4.1 g/dL (ref 3.8–4.9)
Alkaline Phosphatase: 141 [IU]/L — ABNORMAL HIGH (ref 44–121)
BUN/Creatinine Ratio: 22 (ref 9–23)
BUN: 16 mg/dL (ref 6–24)
Bilirubin Total: 0.3 mg/dL (ref 0.0–1.2)
CO2: 23 mmol/L (ref 20–29)
Calcium: 9.7 mg/dL (ref 8.7–10.2)
Chloride: 100 mmol/L (ref 96–106)
Creatinine, Ser: 0.74 mg/dL (ref 0.57–1.00)
Globulin, Total: 3.2 g/dL (ref 1.5–4.5)
Glucose: 73 mg/dL (ref 70–99)
Potassium: 4.5 mmol/L (ref 3.5–5.2)
Sodium: 139 mmol/L (ref 134–144)
Total Protein: 7.3 g/dL (ref 6.0–8.5)
eGFR: 93 mL/min/{1.73_m2} (ref >59)

## 2022-12-09 ENCOUNTER — Telehealth: Payer: Self-pay | Admitting: Licensed Clinical Social Worker

## 2022-12-09 NOTE — Telephone Encounter (Signed)
Met with the patient during a warm handoff from PCP.  Patient score high on her PHQ-9 on stating that she has suicidal thoughts but does not have a plan and would not do so.  Patient shared that it is difficult living with her mother and other life stressors.  Patient stated that she is feeling that she has sometimes and would like some coping skills. [facility] provided pt with some.   Depression Can be caused by many things - genes, brain make-up, life events e.g. death of a loved one, many forms of abuse, big changes like moving, parents splitting, being bullied, lasting health issues.  Who can get depressed? This illness affects 11% of people by age 76, and is the third leading cause of death for youth ages 59 to 10. Both female & females feel equal effects up to the age of 90. During the teen years (13+) girls are more likely to have increased symptoms compared to boys. Females - It's normal to have the blues or feel sick before and during a period. Hormone levels affect girl's body and mind; this is also known as Premenstrual syndrome (PMS).  Depression and You  Body  Eating and sleeping too much or too little. More annoyed, moody, or grouchy than usual Aches and pains that can't be explained. (ex. headaches, stomach pain, etc.) Behavior Reckless behavior - alcohol, drugs, unsafe sex Little interest or pleasure in doing things. (ex. former hobbies, social outings) Every little thing or person gets on your nerves Mind Feeling hopeless and helpless Can't control your gloomy and bad thoughts, no matter how much you try You harshly judge yourself Feeling bad about yourself   Treatment Levels- based upon duration and harshness of symptoms  Level 1 - Counseling  Level 2 - Level 1 plus meds may be an option if symptoms continue Level 3 - Hospital (symptoms of high concern and violent thoughts about yourself and/or others)  What can I do today to make myself feel better? Give treatment a fair  chance--go to sessions and follow your doctor's or therapist's advice, which includes advice about training or "homework" to try between visits/sessions Simple exercises Try hobbies that you used to enjoy or try to develop a new one Break up large projects into smaller tasks and do what you can Spend time with or call your friends and family Expect your mood to improve over time with treatment Keep in mind that good thoughts will replace gloomy and bad thoughts as your depression responds to treatment.

## 2022-12-10 ENCOUNTER — Encounter (HOSPITAL_BASED_OUTPATIENT_CLINIC_OR_DEPARTMENT_OTHER): Payer: Self-pay | Admitting: Pulmonary Disease

## 2022-12-10 ENCOUNTER — Ambulatory Visit (HOSPITAL_BASED_OUTPATIENT_CLINIC_OR_DEPARTMENT_OTHER): Payer: Medicaid Other | Admitting: Pulmonary Disease

## 2022-12-10 VITALS — BP 114/76 | HR 77 | Ht 65.0 in | Wt 183.0 lb

## 2022-12-10 DIAGNOSIS — J9611 Chronic respiratory failure with hypoxia: Secondary | ICD-10-CM

## 2022-12-10 DIAGNOSIS — J432 Centrilobular emphysema: Secondary | ICD-10-CM | POA: Diagnosis not present

## 2022-12-10 DIAGNOSIS — Z720 Tobacco use: Secondary | ICD-10-CM | POA: Diagnosis not present

## 2022-12-10 NOTE — Assessment & Plan Note (Signed)
Continue Trelegy. Use albuterol for rescue. We discussed COPD action plan and signs and symptoms of COPD exacerbation

## 2022-12-10 NOTE — Assessment & Plan Note (Addendum)
We checked ambulatory saturation on 4 L POC and she did not desaturate.  I have asked her to continue on POC. Review of her PFT shows air trapping but not significant hyperinflation, doubt she will qualify for endobronchial valve but we can consider in the future. Reviewed her echo from 2022 which does not show pulm hypertension although pulmonary artery appeared enlarged on last CT

## 2022-12-10 NOTE — Progress Notes (Signed)
   Subjective:    Patient ID: Cheryl King, female    DOB: [birth year], 57 y.o.   MRN: [id number]  HPI  57 yo smoker for FU of COPD & chronic resp failure on O2 She smokes about half pack per day starting as a teenager for 40 years, more than 20 pack years   She completed pulmonary rehab and was noted to desaturate to 84% and was started on oxygen 02/2021  40-month follow-up visit. She was told by [facility] that her POC is not enough and needs an oxygen evaluation. She reports increased stress due to family issues, she lost her grandmother.  She has increased her smoking.  She is down to 2 cigarettes and is now back up to half pack per day She has gained 20 pounds over the last year.  Flu shot is up-to-date We reviewed lung cancer screening study She is compliant with Trelegy and denies frequent use of albuterol  Significant tests/ events reviewed PFTs 02/2021 >> severe airway obstruction, ratio 52, FEV1 44%, FVC 67%, DLCO 33%   06/2022 LDCT chest RADS2 ,enlarged PA  07/2021 CT chest >> stable 5mm RLL nodule 08/2020 CT chest >> 5mm nodule RLL unchanged since 06/2019  Review of Systems neg for any significant sore throat, dysphagia, itching, sneezing, nasal congestion or excess/ purulent secretions, fever, chills, sweats, unintended wt loss, pleuritic or exertional cp, hempoptysis, orthopnea pnd or change in chronic leg swelling. Also denies presyncope, palpitations, heartburn, abdominal pain, nausea, vomiting, diarrhea or change in bowel or urinary habits, dysuria,hematuria, rash, arthralgias, visual complaints, headache, numbness weakness or ataxia.     Objective:   Physical Exam  Gen. Pleasant, on POC, in no distress ENT - no lesions, no post nasal drip Neck: No JVD, no thyromegaly, no carotid bruits Lungs: no use of accessory muscles, no dullness to percussion, decreased without rales or rhonchi  Cardiovascular: Rhythm regular, heart sounds  normal, no murmurs or gallops, no  peripheral edema Musculoskeletal: No deformities, no cyanosis or clubbing , no tremors       Assessment & Plan:

## 2022-12-10 NOTE — Assessment & Plan Note (Signed)
Smoking cessation was again emphasized is the most important intervention .  We discussed coping mechanisms to deal with her stress

## 2022-12-10 NOTE — Patient Instructions (Signed)
Continue on 4 L POC  Try to cut down smoking & QUIT!

## 2022-12-13 ENCOUNTER — Encounter: Payer: Self-pay | Admitting: Orthopedic Surgery

## 2022-12-20 ENCOUNTER — Ambulatory Visit
Admission: RE | Admit: 2022-12-20 | Discharge: 2022-12-20 | Disposition: A | Discharge status: Home / Self Care | Payer: Medicaid Other | Source: Ambulatory Visit | Attending: Orthopedic Surgery | Admitting: Orthopedic Surgery

## 2022-12-20 DIAGNOSIS — M23321 Other meniscus derangements, posterior horn of medial meniscus, right knee: Secondary | ICD-10-CM

## 2022-12-21 ENCOUNTER — Encounter: Payer: Self-pay | Admitting: Physical Medicine and Rehabilitation

## 2022-12-24 ENCOUNTER — Other Ambulatory Visit: Payer: Self-pay | Admitting: Physical Medicine and Rehabilitation

## 2022-12-24 DIAGNOSIS — M5416 Radiculopathy, lumbar region: Secondary | ICD-10-CM

## 2022-12-27 ENCOUNTER — Other Ambulatory Visit: Payer: Self-pay | Admitting: Adult Health

## 2023-01-04 ENCOUNTER — Encounter: Payer: Self-pay | Admitting: Family Medicine

## 2023-01-13 ENCOUNTER — Other Ambulatory Visit: Payer: Self-pay

## 2023-01-13 ENCOUNTER — Ambulatory Visit: Payer: Medicaid Other | Admitting: Physical Medicine and Rehabilitation

## 2023-01-13 DIAGNOSIS — M5416 Radiculopathy, lumbar region: Secondary | ICD-10-CM | POA: Diagnosis not present

## 2023-01-13 MED ORDER — METHYLPREDNISOLONE ACETATE 40 MG/ML IJ SUSP
40.0000 mg | Freq: Once | INTRAMUSCULAR | Status: AC
  Administered 2023-01-13: 40 mg

## 2023-01-13 NOTE — Procedures (Signed)
Lumbosacral Transforaminal Epidural Steroid Injection - Sub-Pedicular Approach with Fluoroscopic Guidance  Patient: Cheryl King      Date of Birth: [birth year] MRN: [id number] PCP: Hoy Register, MD      Visit Date: 01/13/2023   Universal Protocol:    Date/Time: 01/13/2023  Consent Given By: the patient  Position: PRONE  Additional Comments: Vital signs were monitored before and after the procedure. Patient was prepped and draped in the usual sterile fashion. The correct patient, procedure, and site was verified.   Injection Procedure Details:   Procedure diagnoses: Lumbar radiculopathy [M54.16]    Meds Administered:  Meds ordered this encounter  Medications   methylPREDNISolone acetate (DEPO-MEDROL) injection 40 mg    Laterality: Bilateral  Location/Site: L5  Needle:5.0 in., 22 ga.  Short bevel or Quincke spinal needle  Needle Placement: Transforaminal  Findings:    -Comments: Excellent flow of contrast along the nerve, nerve root and into the epidural space.  Procedure Details: After squaring off the end-plates to get a true AP view, the C-arm was positioned so that an oblique view of the foramen as noted above was visualized. The target area is just inferior to the "nose of the scotty dog" or sub pedicular. The soft tissues overlying this structure were infiltrated with 2-3 ml. of 1% Lidocaine without Epinephrine.  The spinal needle was inserted toward the target using a "trajectory" view along the fluoroscope beam.  Under AP and lateral visualization, the needle was advanced so it did not puncture dura and was located close the 6 O'Clock position of the pedical in AP tracterory. Biplanar projections were used to confirm position. Aspiration was confirmed to be negative for CSF and/or blood. A 1-2 ml. volume of Isovue-250 was injected and flow of contrast was noted at each level. Radiographs were obtained for documentation purposes.   After attaining the  desired flow of contrast documented above, a 0.5 to 1.0 ml test dose of 0.25% Marcaine was injected into each respective transforaminal space.  The patient was observed for 90 seconds post injection.  After no sensory deficits were reported, and normal lower extremity motor function was noted,   the above injectate was administered so that equal amounts of the injectate were placed at each foramen (level) into the transforaminal epidural space.   Additional Comments:  The patient tolerated the procedure well Dressing: 2 x 2 sterile gauze and Band-Aid    Post-procedure details: Patient was observed during the procedure. Post-procedure instructions were reviewed.  Patient left the clinic in stable condition.

## 2023-01-13 NOTE — Patient Instructions (Signed)

## 2023-01-13 NOTE — Progress Notes (Signed)
Dick Burdge - 57 y.o. female MRN [id number]  Date of birth: [birth year]  Office Visit Note: Visit Date: 01/13/2023 PCP: Hoy Register, MD Referred by: London Sheer, MD  Subjective: No chief complaint on file.  HPI:  Cheryl King is a 57 y.o. female who comes in today for planned repeat Bilateral L5-S1  Lumbar Transforaminal epidural steroid injection with fluoroscopic guidance.  The patient has failed conservative care including home exercise, medications, time and activity modification.  This injection will be diagnostic and hopefully therapeutic.  Please see requesting physician notes for further details and justification. Patient received more than 50% pain relief from prior injection.   Referring: Dr. Willia Craze   ROS Otherwise per HPI.  Assessment & Plan: Visit Diagnoses:    ICD-10-CM   1. Lumbar radiculopathy  M54.16 methylPREDNISolone acetate (DEPO-MEDROL) injection 40 mg    XR C-ARM NO REPORT    Epidural Steroid injection      Plan: No additional findings.   Meds & Orders:  Meds ordered this encounter  Medications   methylPREDNISolone acetate (DEPO-MEDROL) injection 40 mg    Orders Placed This Encounter  Procedures   XR C-ARM NO REPORT   Epidural Steroid injection    Follow-up: Return for visit to requesting provider as needed.   Procedures: No procedures performed  Lumbosacral Transforaminal Epidural Steroid Injection - Sub-Pedicular Approach with Fluoroscopic Guidance  Patient: Cheryl King      Date of Birth: 12/30/1965 MRN: [id number] PCP: Hoy Register, MD      Visit Date: 01/13/2023   Universal Protocol:    Date/Time: 01/13/2023  Consent Given By: the patient  Position: PRONE  Additional Comments: Vital signs were monitored before and after the procedure. Patient was prepped and draped in the usual sterile fashion. The correct patient, procedure, and site was verified.   Injection Procedure Details:   Procedure  diagnoses: Lumbar radiculopathy [M54.16]    Meds Administered:  Meds ordered this encounter  Medications   methylPREDNISolone acetate (DEPO-MEDROL) injection 40 mg    Laterality: Bilateral  Location/Site: L5  Needle:5.0 in., 22 ga.  Short bevel or Quincke spinal needle  Needle Placement: Transforaminal  Findings:    -Comments: Excellent flow of contrast along the nerve, nerve root and into the epidural space.  Procedure Details: After squaring off the end-plates to get a true AP view, the C-arm was positioned so that an oblique view of the foramen as noted above was visualized. The target area is just inferior to the "nose of the scotty dog" or sub pedicular. The soft tissues overlying this structure were infiltrated with 2-3 ml. of 1% Lidocaine without Epinephrine.  The spinal needle was inserted toward the target using a "trajectory" view along the fluoroscope beam.  Under AP and lateral visualization, the needle was advanced so it did not puncture dura and was located close the 6 O'Clock position of the pedical in AP tracterory. Biplanar projections were used to confirm position. Aspiration was confirmed to be negative for CSF and/or blood. A 1-2 ml. volume of Isovue-250 was injected and flow of contrast was noted at each level. Radiographs were obtained for documentation purposes.   After attaining the desired flow of contrast documented above, a 0.5 to 1.0 ml test dose of 0.25% Marcaine was injected into each respective transforaminal space.  The patient was observed for 90 seconds post injection.  After no sensory deficits were reported, and normal lower extremity motor function was noted,   the above injectate was administered  so that equal amounts of the injectate were placed at each foramen (level) into the transforaminal epidural space.   Additional Comments:  The patient tolerated the procedure well Dressing: 2 x 2 sterile gauze and Band-Aid    Post-procedure  details: Patient was observed during the procedure. Post-procedure instructions were reviewed.  Patient left the clinic in stable condition.    Clinical History: EXAM: MRI LUMBAR SPINE WITHOUT AND WITH CONTRAST   TECHNIQUE: Multiplanar and multiecho pulse sequences of the lumbar spine were obtained without and with intravenous contrast.   CONTRAST:  15mL MULTIHANCE GADOBENATE DIMEGLUMINE 529 MG/ML IV SOLN   COMPARISON:  01/05/2017   FINDINGS: Segmentation:  5 lumbar type vertebrae   Alignment:  Mild scoliosis.  No listhesis   Vertebrae: No fracture, evidence of discitis, or bone lesion. Endplate sclerosis has developed at L2-3 and L5-S1, degenerative appearing.   Conus medullaris and cauda equina: Conus extends to the L1 level. Conus and cauda equina appear normal.   Paraspinal and other soft tissues: No perispinal mass or inflammation noted.   Disc levels:   T11-12: Chronic disc collapse with ventral protrusion.   T12- L1: Unremarkable.   L1-L2: Disc narrowing and bulging with endplate degeneration. Patent canal and foramina   L2-L3: Disc narrowing and bulging with endplate degeneration. Circumferential disc bulging and facet spurring. Mild or moderate left foraminal narrowing   L3-L4: Disc narrowing and bulging with facet spurring. Patent canal and foramina   L4-L5: Disc narrowing and bulging. Degenerative facet spurring greater on the right. Mild-to-moderate right foraminal narrowing   L5-S1:Disc collapse and endplate degeneration with bulge. Degenerative facet spurring on the right more than left. Biforaminal impingement, worse on the right and progressed.   IMPRESSION: 1. Generalized lumbar spine degeneration with progression from 2018, especially at L5-S1 where there is right more than left foraminal impingement. 2. Diffusely patent spinal canal. 3. Mild scoliosis.     Electronically Signed   By: Tiburcio Pea M.D.   On: 09/30/2020 09:57      Objective:  VS:  HT:    WT:   BMI:     BP:   HR: bpm  TEMP: ( )  RESP:  Physical Exam Vitals and nursing note reviewed.  Constitutional:      General: She is not in acute distress.    Appearance: Normal appearance. She is not ill-appearing.  HENT:     Head: Normocephalic and atraumatic.     Right Ear: External ear normal.     Left Ear: External ear normal.  Eyes:     Extraocular Movements: Extraocular movements intact.  Cardiovascular:     Rate and Rhythm: Normal rate.     Pulses: Normal pulses.  Pulmonary:     Effort: Pulmonary effort is normal. No respiratory distress.  Abdominal:     General: There is no distension.     Palpations: Abdomen is soft.  Musculoskeletal:        General: Tenderness present.     Cervical back: Neck supple.     Right lower leg: No edema.     Left lower leg: No edema.     Comments: Patient has good distal strength with no pain over the greater trochanters.  No clonus or focal weakness.  Skin:    Findings: No erythema, lesion or rash.  Neurological:     General: No focal deficit present.     Mental Status: She is alert and oriented to person, place, and time.  Sensory: No sensory deficit.     Motor: No weakness or abnormal muscle tone.     Coordination: Coordination normal.  Psychiatric:        Mood and Affect: Mood normal.        Behavior: Behavior normal.      Imaging: No results found.

## 2023-01-17 ENCOUNTER — Other Ambulatory Visit: Payer: Self-pay | Admitting: Orthopedic Surgery

## 2023-01-18 ENCOUNTER — Other Ambulatory Visit: Payer: Self-pay | Admitting: Family Medicine

## 2023-01-18 DIAGNOSIS — G8929 Other chronic pain: Secondary | ICD-10-CM

## 2023-01-22 ENCOUNTER — Encounter (HOSPITAL_BASED_OUTPATIENT_CLINIC_OR_DEPARTMENT_OTHER): Payer: Self-pay | Admitting: Pulmonary Disease

## 2023-01-24 ENCOUNTER — Other Ambulatory Visit (HOSPITAL_BASED_OUTPATIENT_CLINIC_OR_DEPARTMENT_OTHER): Payer: Self-pay

## 2023-01-24 MED ORDER — PREDNISONE 10 MG PO TABS: ORAL_TABLET | ORAL | 0 refills | Status: DC

## 2023-01-24 MED ORDER — PREDNISONE 10 MG PO TABS
ORAL_TABLET | ORAL | 0 refills | Status: DC
Start: 2023-01-24 — End: 2023-01-24

## 2023-02-07 ENCOUNTER — Ambulatory Visit: Payer: Medicaid Other | Admitting: Orthopedic Surgery

## 2023-02-07 DIAGNOSIS — M7050 Other bursitis of knee, unspecified knee: Secondary | ICD-10-CM

## 2023-02-07 NOTE — Progress Notes (Signed)
Orthopedic Office Note  Patient comes back in with persistent knee pain.  She feels it over the right anterior aspect of the knee.  She feels it just inferior to the joint line.  She notices it mostly when she is kneeling on the knee.  On exam, she has no pain through range of motion.  Knee range of motion from 0-110.  Pain with McMurray but no palpable click.  TTP over the Pez anserine bursa.  No other tenderness to palpation.  No palpable effusion.  Knee stable to varus and valgus stress.  Negative Lachman.  Negative posterior drawer.  EHL/TA/GSC intact.  Sensation intact to light touch in sural/saphenous/deep peroneal/superficial peroneal/tibial nerve distribution.  Foot warm well-perfused.  MRI of the right knee from 12/20/2022 was independently reviewed and interpreted, showing no meniscal tear, no ACL or PCL injury, no collateral ligament injury, minimal chondral loss in all 3 compartments, and no fracture seen.  Patient mostly having pain with kneeling or deep bending at the knee.  She is tender over the pes anserine bursa.  Her MRI did not show any significant ligamentous or meniscal injury.  I recommended Aleve to be taken twice a day for about 10 days.  I also told her to use Voltaren gel over the area.  I told her to take the Aleve with food and water.  I said an injection could be the next step but she was not interested in that.  She will return to the office on an as-needed basis.  London Sheer, MD Orthopedic Surgeon

## 2023-02-28 ENCOUNTER — Encounter: Payer: Self-pay | Admitting: Family Medicine

## 2023-03-03 ENCOUNTER — Telehealth (HOSPITAL_BASED_OUTPATIENT_CLINIC_OR_DEPARTMENT_OTHER): Payer: Medicaid Other | Admitting: Family Medicine

## 2023-03-03 ENCOUNTER — Encounter: Payer: Self-pay | Admitting: Family Medicine

## 2023-03-03 DIAGNOSIS — J069 Acute upper respiratory infection, unspecified: Secondary | ICD-10-CM | POA: Diagnosis not present

## 2023-03-03 MED ORDER — DOXYCYCLINE HYCLATE 100 MG PO TABS: 100.0000 mg | ORAL_TABLET | Freq: Two times a day (BID) | ORAL | 0 refills | Status: DC

## 2023-03-03 NOTE — Progress Notes (Signed)
Virtual Visit via Video Note  I connected with Cheryl King, on 03/03/2023 at 8:15 AM by video enabled telemedicine device and verified that I am speaking with the correct person using two identifiers.   Consent: I discussed the limitations, risks, security and privacy concerns of performing an evaluation and management service by telemedicine and the availability of in person appointments. I also discussed with the patient that there may be a patient responsible charge related to this service. The patient expressed understanding and agreed to proceed.   Location of Patient: Home  Location of Provider: Clinic   Persons participating in Telemedicine visit: Tasheena Wambolt Dr. Alvis Lemmings     History of Present Illness: Cheryl King is a 58 y.o. year old female  with a history of Prediabetes (diet controlled A1c 5.9) chronic low back pain with sciatica, GERD, hyperlipidemia,  Emphysema (on 4L oxygen) tobacco abuse.     Discussed the use of AI scribe software for clinical note transcription with the patient, who gave verbal consent to proceed.  She presents with a week-long history of an upper respiratory symptoms . She reports productive cough with yellow sputum, nasal congestion, and occasional shortness of breath. She denies any worsening of her baseline dyspnea. She also denies fever, but reports occasional chills. She received prednisone from her pulmonologist but that was 1 month ago and she has completed, but reports no improvement. She also reports chronic body aches and back pain. She has been taking Mucinex, which has helped to loosen the mucus. She denies any recent exposure to sick contacts.  She has no sinus pressure or pain.     Past Medical History:  Diagnosis Date   Acid reflux    Allergy    Carpal tunnel syndrome    COPD (chronic obstructive pulmonary disease) (HCC)    Depression    Diverticulitis    Hyperlipidemia    Low back pain    Neck pain     Sacroiliac inflammation (HCC)    Sciatica    Seasonal allergies    Allergies  Allergen Reactions   Percocet [Oxycodone-Acetaminophen] Nausea Only   Vicodin [Hydrocodone-Acetaminophen] Nausea Only   Penicillins Rash    Current Outpatient Medications on File Prior to Visit  Medication Sig Dispense Refill   acetaminophen-codeine (TYLENOL #3) 300-30 MG tablet TAKE 1 TABLET BY MOUTH EVERY 6 (SIX) HOURS AS NEEDED FOR MODERATE PAIN OR SEVERE PAIN 20 tablet 0   amitriptyline (ELAVIL) 50 MG tablet TAKE 1 TABLET (50 MG TOTAL) BY MOUTH AT BEDTIME. 90 tablet 1   Azelastine HCl 137 MCG/SPRAY SOLN Place 1 spray into both nostrils daily. 30 mL 6   cetirizine (ZYRTEC) 10 MG tablet Take 1 tablet (10 mg total) by mouth daily. 90 tablet 1   cyclobenzaprine (FLEXERIL) 10 MG tablet TAKE 1 TABLET (10 MG TOTAL) BY MOUTH 2 (TWO) TIMES DAILY AS NEEDED FOR MUSCLE SPASMS. 180 tablet 1   diazepam (VALIUM) 5 MG tablet Take 1 by mouth 1 hour  pre-procedure with very light food. May bring 2nd tablet to appointment. 2 tablet 0   diclofenac sodium (VOLTAREN) 1 % GEL Apply 2 g topically 4 (four) times daily. 5 Tube 2   DULoxetine (CYMBALTA) 60 MG capsule TAKE 1 CAPSULE (60 MG TOTAL) BY MOUTH DAILY. 90 capsule 1   esomeprazole (NEXIUM) 40 MG capsule Take 1 capsule (40 mg total) by mouth daily. 90 capsule 1   EYSUVIS 0.25 % SUSP Apply 1 drop to eye 4 (four) times daily.  ezetimibe (ZETIA) 10 MG tablet TAKE 1 TABLET BY MOUTH DAILY AFTER SUPPER 90 tablet 1   fluconazole (DIFLUCAN) 150 MG tablet Take 1 tablet (150 mg total) by mouth once a week. 2 tablet 0   fluticasone (FLONASE) 50 MCG/ACT nasal spray INSTILL 1 SPRAY INTO BOTH NOSTRILS DAILY 48 mL 0   Fluticasone-Umeclidin-Vilant (TRELEGY ELLIPTA) 100-62.5-25 MCG/ACT AEPB Inhale 28 each into the lungs daily. 28 each 0   Lactobacillus CAPS Take 1 capsule by mouth daily.     lidocaine (LIDODERM) 5 % Place 1 patch onto the skin daily. Remove & Discard patch within 12 hours or  as directed by MD 30 patch 6   miconazole (MICATIN) 2 % cream Apply 1 application topically 2 (two) times daily. 28.35 g 0   Misc. Devices MISC 1. Rolling walker with seat 2. Shower chair 3. Elevated toilet seat.   Diagnosis-chronic back pain 1 each 0   Misc. Devices MISC Compression stocking.  Diagnosis pedal edema Fax to [phone] [facility] 2 each 0   montelukast (SINGULAIR) 10 MG tablet Take 1 tablet (10 mg total) by mouth daily. 90 tablet 1   Multiple Vitamin (MULTIVITAMIN WITH MINERALS) TABS tablet Take 1 tablet by mouth daily.     nicotine (NICODERM CQ - DOSED IN MG/24 HOURS) 21 mg/24hr patch Place 1 patch (21 mg total) onto the skin daily. Then decrease to 14mg /24 hr 28 patch 2   nitrofurantoin, macrocrystal-monohydrate, (MACROBID) 100 MG capsule Take 1 capsule (100 mg total) by mouth 2 (two) times daily. 10 capsule 0   oxybutynin (DITROPAN) 5 MG tablet TAKE 1 TABLET (5 MG TOTAL) BY MOUTH 2 (TWO) TIMES DAILY. 180 tablet 1   phenazopyridine (PYRIDIUM) 200 MG tablet Take 1 tablet (200 mg total) by mouth 3 (three) times daily as needed for pain. 20 tablet 0   polyethylene glycol powder (GLYCOLAX/MIRALAX) 17 GM/SCOOP powder Take 17 g by mouth daily. 3350 g 1   predniSONE (DELTASONE) 10 MG tablet Take 4 tabs daily with food x 4 days, then 3 tabs daily x 4 days, then 2 tabs daily x 4 days, then 1 tab daily x4 days then stop. 40 tablet 0   pregabalin (LYRICA) 75 MG capsule TAKE 1 CAPSULE BY MOUTH 3 TIMES DAILY. 90 capsule 4   RESTASIS 0.05 % ophthalmic emulsion Place 1 drop into both eyes daily. 0.4 mL 3   rosuvastatin (CRESTOR) 5 MG tablet Take 1 tablet (5 mg total) by mouth daily. 90 tablet 1   TRELEGY ELLIPTA 100-62.5-25 MCG/ACT AEPB TAKE 1 PUFF BY MOUTH EVERY DAY 60 each 5   triamcinolone cream (KENALOG) 0.1 % Apply 1 application topically 2 (two) times daily. 45 g 1   varenicline (CHANTIX) 1 MG tablet Take 1 tablet (1 mg total) by mouth 2 (two) times daily. 60 tablet 2    VENTOLIN HFA 108 (90 Base) MCG/ACT inhaler TAKE 2 PUFFS BY MOUTH EVERY 6 HOURS AS NEEDED FOR WHEEZE OR SHORTNESS OF BREATH 18 each 5   No current facility-administered medications on file prior to visit.    ROS: See HPI  Observations/Objective: Awake, alert, oriented x3 Not in acute distress No sinus tenderness to percussion Normal mood      Latest Ref Rng & Units 12/06/2022   12:03 PM 06/03/2022   11:49 AM 12/28/2021    6:38 PM  CMP  Glucose 70 - 99 mg/dL 73  80  098   BUN 6 - 24 mg/dL 16  14  13   Creatinine 0.57 - 1.00 mg/dL 7.82  9.56  2.13   Sodium 134 - 144 mmol/L 139  140  137   Potassium 3.5 - 5.2 mmol/L 4.5  4.6  3.9   Chloride 96 - 106 mmol/L 100  101  105   CO2 20 - 29 mmol/L 23  24  25    Calcium 8.7 - 10.2 mg/dL 9.7  9.4  9.0   Total Protein 6.0 - 8.5 g/dL 7.3  6.7  7.1   Total Bilirubin 0.0 - 1.2 mg/dL 0.3  0.3  0.3   Alkaline Phos 44 - 121 IU/L 141  126  94   AST 0 - 40 IU/L 22  28  29    ALT 0 - 32 IU/L 14  22  22      Lipid Panel     Component Value Date/Time   CHOL 170 06/03/2022 1149   TRIG 71 06/03/2022 1149   HDL 72 06/03/2022 1149   CHOLHDL 3.2 01/28/2020 1012   CHOLHDL 3.2 12/26/2013 1319   VLDL 18 12/26/2013 1319   LDLCALC 85 06/03/2022 1149   LABVLDL 13 06/03/2022 1149    Lab Results  Component Value Date   HGBA1C 5.7 12/06/2022     Assessment and Plan:     Upper Respiratory Infection Symptoms of cough, phlegm production, and nasal congestion for approximately 1 weeks. No fever, chills, or worsening shortness of breath. No facial pain or pressure. Recently completed a course of prednisone with minimal improvement. -She is a high risk patient with multiple chronic underlying conditions -Start Doxycycline due to penicillin allergy. -Continue Mucinex as needed.        Follow Up Instructions: Keep previously scheduled appointment   I discussed the assessment and treatment plan with the patient. The patient was provided an  opportunity to ask questions and all were answered. The patient agreed with the plan and demonstrated an understanding of the instructions.   The patient was advised to call back or seek an in-person evaluation if the symptoms worsen or if the condition fails to improve as anticipated.     I provided 12 minutes total of Telehealth time during this encounter including median intraservice time, reviewing previous notes, investigations, ordering medications, medical decision making, coordinating care and patient verbalized understanding at the end of the visit.     Hoy Register, MD, FAAFP. [facility] [place], [place] [phone]   03/03/2023, 8:15 AM

## 2023-03-08 ENCOUNTER — Other Ambulatory Visit: Payer: Self-pay | Admitting: Orthopedic Surgery

## 2023-03-18 ENCOUNTER — Other Ambulatory Visit (INDEPENDENT_AMBULATORY_CARE_PROVIDER_SITE_OTHER): Payer: Self-pay

## 2023-03-18 ENCOUNTER — Ambulatory Visit (INDEPENDENT_AMBULATORY_CARE_PROVIDER_SITE_OTHER): Payer: Medicaid Other | Admitting: Orthopedic Surgery

## 2023-03-18 DIAGNOSIS — M25511 Pain in right shoulder: Secondary | ICD-10-CM | POA: Diagnosis not present

## 2023-03-18 DIAGNOSIS — M5416 Radiculopathy, lumbar region: Secondary | ICD-10-CM

## 2023-03-18 MED ORDER — TRAMADOL HCL 50 MG PO TABS
50.0000 mg | ORAL_TABLET | Freq: Four times a day (QID) | ORAL | 0 refills | Status: AC | PRN
Start: 2023-03-18 — End: 2023-03-23

## 2023-03-18 MED ORDER — CYCLOBENZAPRINE HCL 10 MG PO TABS: 10.0000 mg | ORAL_TABLET | Freq: Three times a day (TID) | ORAL | 0 refills | Status: DC | PRN

## 2023-03-18 NOTE — Progress Notes (Signed)
 Orthopedic Spine Surgery Office Note  Assessment: Patient is a 58 y.o. female with 3 issues:  Low back pain with no radicular symptoms Right wrist pain with no swelling or tenderness to palpation Right shoulder pain with some positive shoulder exam maneuver findings   Plan: -For her low back, recommended Flexeril which was prescribed to her today.  She should continue to use a heating pad over the area -For her wrist, I am not concerned about a fracture.  She may have a sprain.  I have prescribed her a wrist brace which was given to her today in the office -For her right shoulder, she does have some pain with rotator cuff and biceps tasks.  Will treat symptomatically.  Prescribed tramadol -If she is doing better at a future visit, could consider starting PT -I told her that the majority of acute injuries do tend to get better with time.  If she is not getting better within a couple weeks time, I told her to return and we can evaluate further -Patient should return to office on an as-needed basis   Patient expressed understanding of the plan and all questions were answered to the patient's satisfaction.   ___________________________________________________________________________  History: Patient is a 58 y.o. female who has been seen for various joint pain comes in today for right shoulder, low back, right wrist pain.  Patient states that she was in the pantry when she fell over a case of water.  She landed on her right side.  She noted onset of low back pain, right shoulder pain, and right wrist pain.  She has no pain radiating to either lower extremity.  She has not noticed any weakness in her legs.  She has had no bowel or bladder incontinence.  No saddle anesthesia.  Pain is localized to the lower lumbar region in the immediately adjacent paraspinal muscles.  In regards to her wrist and hand, she has been able to use them but they are painful.  She has pain in the shoulder with overhead  activity.  It improves with rest but does not go away.  Her wrist is not painful at rest but does hurt with motion or lifting anything.  She does not have any pain in the left upper extremity.  No neck pain.  Previous treatments: Tylenol with codeine, heating pad   Physical Exam:  General: no acute distress, appears stated age Neurologic: alert, answering questions appropriately, following commands Respiratory: unlabored breathing on room air, symmetric chest rise Psychiatric: appropriate affect, normal cadence to speech   MSK (spine):  -Strength exam      Left  Right EHL    5/5  5/5 TA    5/5  5/5 GSC    5/5  5/5 Knee extension  5/5  5/5 Hip flexion   5/5  5/5  -Sensory exam    Sensation intact to light touch in L3-S1 nerve distributions of bilateral lower extremities  -Achilles DTR: 2/4 on the left, 2/4 on the right -Patellar tendon DTR: 2/4 on the left, 2/4 on the right  -Right shoulder exam: TTP over the bicipital groove otherwise nontender palpation over the remainder of the shoulder, pain with Jobe but no weakness, negative drop arm sign, pain with external rotation past 70 degrees, no pain with internal rotation, negative belly press, no weakness with external rotation with arm at side -Right wrist exam: Able to fully extend and flex the wrist, mildly tender to palpation over the dorsal aspect of the wrist, no  swelling seen, AIN/PIN/IO intact, sensation intact to light touch in median/radial/ulnar nerve distributions, hand warm well-perfused  Imaging: XRs of the lumbar spine from 03/18/2023 were independently reviewed and interpreted, showing disc height loss at L5/S1, L1/2 and L2/3.  Anterior osteophyte formation seen at L2/3.  No fracture or dislocation seen.  No evidence of instability on flexion/extension views.  XRs of the right shoulder from 03/10/2023 were independently reviewed interpreted, showing joint space narrowing consistent with arthrosis in the [facility] joint.  No  significant glenohumeral joint degenerative changes seen.  No fracture or dislocation seen.   Patient name: Cheryl King Patient MRN: [id number] Date of visit: 03/18/23

## 2023-03-21 ENCOUNTER — Encounter: Payer: Self-pay | Admitting: Radiology

## 2023-03-21 ENCOUNTER — Encounter: Payer: Self-pay | Admitting: Orthopedic Surgery

## 2023-04-08 ENCOUNTER — Other Ambulatory Visit: Payer: Self-pay | Admitting: Orthopedic Surgery

## 2023-04-20 ENCOUNTER — Encounter: Payer: Self-pay | Admitting: Physical Medicine and Rehabilitation

## 2023-04-20 DIAGNOSIS — M5416 Radiculopathy, lumbar region: Secondary | ICD-10-CM

## 2023-04-28 ENCOUNTER — Encounter: Payer: Self-pay | Admitting: Family Medicine

## 2023-04-28 ENCOUNTER — Encounter (HOSPITAL_BASED_OUTPATIENT_CLINIC_OR_DEPARTMENT_OTHER): Payer: Self-pay | Admitting: Pulmonary Disease

## 2023-04-28 NOTE — Telephone Encounter (Signed)
**Note De-identified  [place] Obfuscation** Please advise 

## 2023-04-29 NOTE — Telephone Encounter (Signed)
**Note De-identified  [place] Obfuscation** Please advise 

## 2023-05-02 ENCOUNTER — Ambulatory Visit: Admitting: Physical Medicine and Rehabilitation

## 2023-05-02 ENCOUNTER — Other Ambulatory Visit: Payer: Self-pay

## 2023-05-02 VITALS — BP 113/76 | HR 79

## 2023-05-02 DIAGNOSIS — M5416 Radiculopathy, lumbar region: Secondary | ICD-10-CM

## 2023-05-02 NOTE — Patient Instructions (Signed)

## 2023-05-02 NOTE — Progress Notes (Unsigned)
 Pain Scale   Average Pain 7 Patient advising that her pain is constant and she never has any relief.        +Driver, -BT, -Dye Allergies.

## 2023-05-04 NOTE — Procedures (Signed)
 Lumbosacral Transforaminal Epidural Steroid Injection - Sub-Pedicular Approach with Fluoroscopic Guidance  Patient: Cheryl King      Date of Birth: March 26, 1965 MRN: [id number] PCP: Joaquin Mulberry, MD      Visit Date: 05/02/2023   Universal Protocol:    Date/Time: 05/02/2023  Consent Given By: the patient  Position: PRONE  Additional Comments: Vital signs were monitored before and after the procedure. Patient was prepped and draped in the usual sterile fashion. The correct patient, procedure, and site was verified.   Injection Procedure Details:   Procedure diagnoses: Lumbar radiculopathy [M54.16]    Meds Administered: 40 mg DepoMedrol   Laterality: Bilateral  Location/Site: L5  Needle:5.0 in., 22 ga.  Short bevel or Quincke spinal needle  Needle Placement: Transforaminal  Findings:    -Comments: Excellent flow of contrast along the nerve, nerve root and into the epidural space.  Procedure Details: After squaring off the end-plates to get a true AP view, the C-arm was positioned so that an oblique view of the foramen as noted above was visualized. The target area is just inferior to the "nose of the scotty dog" or sub pedicular. The soft tissues overlying this structure were infiltrated with 2-3 ml. of 1% Lidocaine without Epinephrine.  The spinal needle was inserted toward the target using a "trajectory" view along the fluoroscope beam.  Under AP and lateral visualization, the needle was advanced so it did not puncture dura and was located close the 6 O'Clock position of the pedical in AP tracterory. Biplanar projections were used to confirm position. Aspiration was confirmed to be negative for CSF and/or blood. A 1-2 ml. volume of Isovue-250 was injected and flow of contrast was noted at each level. Radiographs were obtained for documentation purposes.   After attaining the desired flow of contrast documented above, a 0.5 to 1.0 ml test dose of 0.25% Marcaine was  injected into each respective transforaminal space.  The patient was observed for 90 seconds post injection.  After no sensory deficits were reported, and normal lower extremity motor function was noted,   the above injectate was administered so that equal amounts of the injectate were placed at each foramen (level) into the transforaminal epidural space.   Additional Comments:  No complications occurred Dressing: 2 x 2 sterile gauze and Band-Aid    Post-procedure details: Patient was observed during the procedure. Post-procedure instructions were reviewed.  Patient left the clinic in stable condition.

## 2023-05-04 NOTE — Progress Notes (Signed)
 Cheryl King - 58 y.o. female MRN [id number]  Date of birth: [birth year]  Office Visit Note: Visit Date: 05/02/2023 PCP: Hoy Register, MD Referred by: London Sheer, MD  Subjective: Chief Complaint  Patient presents with   Lower Back - Pain   HPI:  Cheryl King is a 58 y.o. female who comes in today for planned repeat Bilateral L5-S1  Lumbar Transforaminal epidural steroid injection with fluoroscopic guidance.  The patient has failed conservative care including home exercise, medications, time and activity modification.  This injection will be diagnostic and hopefully therapeutic.  Please see requesting physician notes for further details and justification. Patient received more than 50% pain relief from prior injection.   Referring: Dr. Willia Craze   ROS Otherwise per HPI.  Assessment & Plan: Visit Diagnoses:    ICD-10-CM   1. Lumbar radiculopathy  M54.16 XR C-ARM NO REPORT    Epidural Steroid injection      Plan: No additional findings.   Meds & Orders: No orders of the defined types were placed in this encounter.   Orders Placed This Encounter  Procedures   XR C-ARM NO REPORT   Epidural Steroid injection    Follow-up: No follow-ups on file.   Procedures: No procedures performed  Lumbosacral Transforaminal Epidural Steroid Injection - Sub-Pedicular Approach with Fluoroscopic Guidance  Patient: Cheryl King      Date of Birth: Jan 05, 1966 MRN: [id number] PCP: Hoy Register, MD      Visit Date: 05/02/2023   Universal Protocol:    Date/Time: 05/02/2023  Consent Given By: the patient  Position: PRONE  Additional Comments: Vital signs were monitored before and after the procedure. Patient was prepped and draped in the usual sterile fashion. The correct patient, procedure, and site was verified.   Injection Procedure Details:   Procedure diagnoses: Lumbar radiculopathy [M54.16]    Meds Administered: 40 mg  DepoMedrol   Laterality: Bilateral  Location/Site: L5  Needle:5.0 in., 22 ga.  Short bevel or Quincke spinal needle  Needle Placement: Transforaminal  Findings:    -Comments: Excellent flow of contrast along the nerve, nerve root and into the epidural space.  Procedure Details: After squaring off the end-plates to get a true AP view, the C-arm was positioned so that an oblique view of the foramen as noted above was visualized. The target area is just inferior to the "nose of the scotty dog" or sub pedicular. The soft tissues overlying this structure were infiltrated with 2-3 ml. of 1% Lidocaine without Epinephrine.  The spinal needle was inserted toward the target using a "trajectory" view along the fluoroscope beam.  Under AP and lateral visualization, the needle was advanced so it did not puncture dura and was located close the 6 O'Clock position of the pedical in AP tracterory. Biplanar projections were used to confirm position. Aspiration was confirmed to be negative for CSF and/or blood. A 1-2 ml. volume of Isovue-250 was injected and flow of contrast was noted at each level. Radiographs were obtained for documentation purposes.   After attaining the desired flow of contrast documented above, a 0.5 to 1.0 ml test dose of 0.25% Marcaine was injected into each respective transforaminal space.  The patient was observed for 90 seconds post injection.  After no sensory deficits were reported, and normal lower extremity motor function was noted,   the above injectate was administered so that equal amounts of the injectate were placed at each foramen (level) into the transforaminal epidural space.   Additional Comments:  No complications occurred Dressing: 2 x 2 sterile gauze and Band-Aid    Post-procedure details: Patient was observed during the procedure. Post-procedure instructions were reviewed.  Patient left the clinic in stable condition.    Clinical History: EXAM: MRI LUMBAR  SPINE WITHOUT AND WITH CONTRAST   TECHNIQUE: Multiplanar and multiecho pulse sequences of the lumbar spine were obtained without and with intravenous contrast.   CONTRAST:  15mL MULTIHANCE GADOBENATE DIMEGLUMINE 529 MG/ML IV SOLN   COMPARISON:  01/05/2017   FINDINGS: Segmentation:  5 lumbar type vertebrae   Alignment:  Mild scoliosis.  No listhesis   Vertebrae: No fracture, evidence of discitis, or bone lesion. Endplate sclerosis has developed at L2-3 and L5-S1, degenerative appearing.   Conus medullaris and cauda equina: Conus extends to the L1 level. Conus and cauda equina appear normal.   Paraspinal and other soft tissues: No perispinal mass or inflammation noted.   Disc levels:   T11-12: Chronic disc collapse with ventral protrusion.   T12- L1: Unremarkable.   L1-L2: Disc narrowing and bulging with endplate degeneration. Patent canal and foramina   L2-L3: Disc narrowing and bulging with endplate degeneration. Circumferential disc bulging and facet spurring. Mild or moderate left foraminal narrowing   L3-L4: Disc narrowing and bulging with facet spurring. Patent canal and foramina   L4-L5: Disc narrowing and bulging. Degenerative facet spurring greater on the right. Mild-to-moderate right foraminal narrowing   L5-S1:Disc collapse and endplate degeneration with bulge. Degenerative facet spurring on the right more than left. Biforaminal impingement, worse on the right and progressed.   IMPRESSION: 1. Generalized lumbar spine degeneration with progression from 2018, especially at L5-S1 where there is right more than left foraminal impingement. 2. Diffusely patent spinal canal. 3. Mild scoliosis.     Electronically Signed   By: Ronnette Coke M.D.   On: 09/30/2020 09:57     Objective:  VS:  HT:    WT:   BMI:     BP:113/76  HR:79bpm  TEMP: ( )  RESP:  Physical Exam Vitals and nursing note reviewed.  Constitutional:      General: She is not in  acute distress.    Appearance: Normal appearance. She is not ill-appearing.  HENT:     Head: Normocephalic and atraumatic.     Right Ear: External ear normal.     Left Ear: External ear normal.     Nose: Nose normal.     Comments: O2 [place] Eyes:     Extraocular Movements: Extraocular movements intact.  Cardiovascular:     Rate and Rhythm: Normal rate.     Pulses: Normal pulses.  Pulmonary:     Effort: Pulmonary effort is normal. No respiratory distress.  Abdominal:     General: There is no distension.     Palpations: Abdomen is soft.  Musculoskeletal:        General: Tenderness present.     Cervical back: Neck supple.     Right lower leg: No edema.     Left lower leg: No edema.     Comments: Patient has good distal strength with no pain over the greater trochanters.  No clonus or focal weakness.  Skin:    Findings: No erythema, lesion or rash.  Neurological:     General: No focal deficit present.     Mental Status: She is alert and oriented to person, place, and time.     Sensory: No sensory deficit.     Motor: No weakness or abnormal muscle tone.  Coordination: Coordination normal.  Psychiatric:        Mood and Affect: Mood normal.        Behavior: Behavior normal.      Imaging: No results found.

## 2023-05-06 ENCOUNTER — Other Ambulatory Visit: Payer: Self-pay | Admitting: Family Medicine

## 2023-05-06 NOTE — Telephone Encounter (Signed)
 Requested Prescriptions  Refused Prescriptions Disp Refills   atorvastatin  (LIPITOR) 20 MG tablet [Pharmacy Med Name: ATORVASTATIN  20 MG TABLET] 90 tablet 1    Sig: TAKE 1 TABLET BY MOUTH EVERY DAY     Cardiovascular:  Antilipid - Statins Failed - 05/06/2023 12:15 PM      Failed - Lipid Panel in normal range within the last 12 months    Cholesterol, Total  Date Value Ref Range Status  06/03/2022 170 100 - 199 mg/dL Final   LDL Chol Calc (NIH)  Date Value Ref Range Status  06/03/2022 85 0 - 99 mg/dL Final   HDL  Date Value Ref Range Status  06/03/2022 72 >39 mg/dL Final   Triglycerides  Date Value Ref Range Status  06/03/2022 71 0 - 149 mg/dL Final         Passed - Patient is not pregnant      Passed - Valid encounter within last 12 months    Recent Outpatient Visits           2 months ago Upper respiratory tract infection, unspecified type   [facility] [place]. [facility] Joaquin Mulberry, MD   5 months ago Prediabetes   [facility] [facility] Of [place]. [facility] Joaquin Mulberry, MD   11 months ago Tobacco abuse   [facility] [place]. [facility] Joaquin Mulberry, MD   1 year ago Chronic respiratory failure with hypoxia [facility])   [facility] [facility] [place]. [facility] Joaquin Mulberry, MD   1 year ago Encounter for screening mammogram for malignant neoplasm of breast   [place] [facility] - [facility] Dept Of [place]. [facility] Joaquin Mulberry, MD       Future Appointments             In 1 month Joaquin Mulberry, MD [facility] - A Dept Of [place]. [facility]

## 2023-05-09 ENCOUNTER — Other Ambulatory Visit: Payer: Self-pay | Admitting: Family Medicine

## 2023-05-10 ENCOUNTER — Other Ambulatory Visit: Payer: Self-pay

## 2023-05-11 ENCOUNTER — Telehealth: Payer: Self-pay

## 2023-05-11 ENCOUNTER — Other Ambulatory Visit (HOSPITAL_COMMUNITY): Payer: Self-pay

## 2023-05-11 NOTE — Telephone Encounter (Signed)
 Pharmacy Patient Advocate Encounter  Received notification from [facility] that Prior Authorization for Trelegy Ellipta  100-62.5-25MCG/ACT aerosol powder  has been CANCELLED due to "Available without authorization"   PA #/Case ID/Reference #: [id number]

## 2023-05-27 ENCOUNTER — Other Ambulatory Visit (HOSPITAL_COMMUNITY): Payer: Self-pay

## 2023-05-29 ENCOUNTER — Other Ambulatory Visit: Payer: Self-pay | Admitting: Family Medicine

## 2023-05-31 NOTE — Telephone Encounter (Signed)
 Requested Prescriptions  Pending Prescriptions Disp Refills   montelukast  (SINGULAIR ) 10 MG tablet [Pharmacy Med Name: MONTELUKAST  SOD 10 MG TABLET] 90 tablet 0    Sig: TAKE 1 TABLET BY MOUTH EVERY DAY     Pulmonology:  Leukotriene Inhibitors Passed - 05/31/2023 11:49 AM      Passed - Valid encounter within last 12 months    Recent Outpatient Visits           2 months ago Upper respiratory tract infection, unspecified type   [place] [facility] Of [place]. [facility] Joaquin Mulberry, MD   5 months ago Prediabetes   [place] [facility] - [facility] Dept Of [place]. [facility] Joaquin Mulberry, MD   12 months ago Tobacco abuse   [facility] [facility] [place] [facility] Dept Of [place]. [facility] Joaquin Mulberry, MD   1 year ago Chronic respiratory failure with hypoxia [facility])   [place] [facility] - [facility] Dept Of [place]. [facility] Joaquin Mulberry, MD   1 year ago Encounter for screening mammogram for malignant neoplasm of breast    [facility] [place] - A Dept Of [place]. [facility] Joaquin Mulberry, MD       Future Appointments             In 6 days Joaquin Mulberry, MD [facility] - A Dept Of [place]. [facility]

## 2023-06-06 ENCOUNTER — Ambulatory Visit: Payer: Medicaid Other | Admitting: Family Medicine

## 2023-06-14 ENCOUNTER — Other Ambulatory Visit: Payer: Self-pay | Admitting: Family Medicine

## 2023-06-27 ENCOUNTER — Encounter (HOSPITAL_BASED_OUTPATIENT_CLINIC_OR_DEPARTMENT_OTHER): Payer: Self-pay | Admitting: Pulmonary Disease

## 2023-06-28 NOTE — Telephone Encounter (Signed)
CT lung screen

## 2023-07-04 ENCOUNTER — Telehealth: Payer: Self-pay | Admitting: *Deleted

## 2023-07-04 ENCOUNTER — Other Ambulatory Visit: Payer: Self-pay | Admitting: Family Medicine

## 2023-07-04 NOTE — Telephone Encounter (Signed)
 Left voicemail for patient to call to schedule yearly lung screening CT scan.

## 2023-07-05 ENCOUNTER — Other Ambulatory Visit: Payer: Self-pay | Admitting: Orthopedic Surgery

## 2023-07-08 ENCOUNTER — Other Ambulatory Visit: Payer: Self-pay | Admitting: Acute Care

## 2023-07-08 ENCOUNTER — Other Ambulatory Visit: Payer: Self-pay | Admitting: Family Medicine

## 2023-07-08 ENCOUNTER — Other Ambulatory Visit (HOSPITAL_BASED_OUTPATIENT_CLINIC_OR_DEPARTMENT_OTHER): Payer: Self-pay | Admitting: Pulmonary Disease

## 2023-07-08 DIAGNOSIS — F17210 Nicotine dependence, cigarettes, uncomplicated: Secondary | ICD-10-CM

## 2023-07-08 DIAGNOSIS — N39498 Other specified urinary incontinence: Secondary | ICD-10-CM

## 2023-07-08 DIAGNOSIS — Z122 Encounter for screening for malignant neoplasm of respiratory organs: Secondary | ICD-10-CM

## 2023-07-08 DIAGNOSIS — Z720 Tobacco use: Secondary | ICD-10-CM

## 2023-07-08 DIAGNOSIS — Z87891 Personal history of nicotine dependence: Secondary | ICD-10-CM

## 2023-07-08 NOTE — Telephone Encounter (Signed)
 PT called in and left a VM regarding scheduling LDCT. Attempted to call pt but unable to leave a VM due to VM now not being set up.

## 2023-07-12 ENCOUNTER — Encounter (HOSPITAL_BASED_OUTPATIENT_CLINIC_OR_DEPARTMENT_OTHER): Payer: Self-pay | Admitting: Pulmonary Disease

## 2023-07-13 ENCOUNTER — Other Ambulatory Visit: Payer: Self-pay | Admitting: Family Medicine

## 2023-07-13 NOTE — Telephone Encounter (Signed)
 Can you please advise on Prednisone 

## 2023-07-13 NOTE — Telephone Encounter (Signed)
 Please advise on portable tanks as I do not see it in your note from 11/2022 on mention of POC

## 2023-07-15 ENCOUNTER — Ambulatory Visit (HOSPITAL_BASED_OUTPATIENT_CLINIC_OR_DEPARTMENT_OTHER): Payer: Self-pay | Admitting: Orthopaedic Surgery

## 2023-07-15 DIAGNOSIS — M2141 Flat foot [pes planus] (acquired), right foot: Secondary | ICD-10-CM | POA: Diagnosis not present

## 2023-07-15 DIAGNOSIS — M2142 Flat foot [pes planus] (acquired), left foot: Secondary | ICD-10-CM

## 2023-07-15 NOTE — Progress Notes (Signed)
 Chief Complaint: Left foot pain     History of Present Illness:    Cheryl King is a 58 y.o. female presents with ongoing pain about the left medial foot callus as well as lateral aspect of the plantar foot.  She does have a history of severe flatfoot.  She is on oxygen  for COPD.  She does have a history of smoking.  She is here today for further discussion of pain about her calluses    PMH/PSH/Family History/Social History/Meds/Allergies:    Past Medical History:  Diagnosis Date   Acid reflux    Allergy    Carpal tunnel syndrome    COPD (chronic obstructive pulmonary disease) (HCC)    Depression    Diverticulitis    Hyperlipidemia    Low back pain    Neck pain    Sacroiliac inflammation (HCC)    Sciatica    Seasonal allergies    Past Surgical History:  Procedure Laterality Date   ABDOMINAL SURGERY     ANKLE SURGERY     lt.   BREAST EXCISIONAL BIOPSY Right    pt states years ago- not sure when   PARTIAL HYSTERECTOMY     right breast cyst removed      URETHRAL DIVERTICULUM REPAIR     uretrral diverticultis   Social History   Socioeconomic History   Marital status: Married    Spouse name: Vinie   Number of children: 0   Years of education: Bachelors   Highest education level: Bachelor's degree (e.g., BA, AB, BS)  Occupational History   Occupation: Unemployed   Occupation: past Conservation officer, nature, International aid/development worker at [employer], office positions, Landscape architect.  Tobacco Use   Smoking status: Every Day    Current packs/day: 0.50    Average packs/day: 0.5 packs/day for 44.5 years (22.2 ttl pk-yrs)    Types: Cigarettes    Start date: 58    Passive exposure: Past (uses patches, down to 3 cigarettes a day)   Smokeless tobacco: Never   Tobacco comments:    Pt states she smokes about 2 ciggs daily.  Vaping Use   Vaping status: Never Used  Substance and Sexual Activity   Alcohol use: Not Currently    Comment: Social only - seldom   Drug use: No    Sexual activity: Never  Other Topics Concern   Not on file  Social History Narrative   Lives at home alone.   Right-handed.   Occasional caffeine  use.   Social Drivers of Corporate investment banker Strain: High Risk (12/05/2022)   Overall Financial Resource Strain (CARDIA)    Difficulty of Paying Living Expenses: Very hard  Food Insecurity: No Food Insecurity (08/14/2022)   Hunger Vital Sign    Worried About Running Out of Food in the Last Year: Never true    Ran Out of Food in the Last Year: Never true  Transportation Needs: No Transportation Needs (08/14/2022)   PRAPARE - Administrator, Civil Service (Medical): No    Lack of Transportation (Non-Medical): No  Physical Activity: Unknown (08/14/2022)   Exercise Vital Sign    Days of Exercise per Week: 0 days    Minutes of Exercise per Session: Not on file  Stress: Stress Concern Present (08/14/2022)   [employer] of Occupational Health - Occupational Stress Questionnaire    Feeling of Stress : Very much  Social Connections: Socially Isolated (08/14/2022)   Social Connection and Isolation Panel    Frequency  of Communication with Friends and Family: More than three times a week    Frequency of Social Gatherings with Friends and Family: Once a week    Attends Religious Services: Never    Database administrator or Organizations: No    Attends Engineer, structural: Not on file    Marital Status: Separated   Family History  Problem Relation Age of Onset   Healthy Mother    Other Father        Unsure of medical history   Asthma Maternal Aunt    Diabetes Maternal Aunt    Cancer Maternal Aunt    Breast cancer Maternal Aunt    Hypertension Maternal Grandmother    Heart Problems Maternal Grandmother    Hypertension Other    COPD Other    Colon cancer Neg Hx    Pancreatic cancer Neg Hx    Rectal cancer Neg Hx    Stomach cancer Neg Hx    Colon polyps Neg Hx    Esophageal cancer Neg Hx    Allergies   Allergen Reactions   Percocet [Oxycodone -Acetaminophen ] Nausea Only   Vicodin [Hydrocodone -Acetaminophen ] Nausea Only   Penicillins Rash   Current Outpatient Medications  Medication Sig Dispense Refill   acetaminophen -codeine  (TYLENOL  #3) 300-30 MG tablet TAKE 1 TABLET BY MOUTH EVERY 6 (SIX) HOURS AS NEEDED FOR MODERATE PAIN OR SEVERE PAIN 20 tablet 0   Azelastine  HCl 137 MCG/SPRAY SOLN Place 1 spray into both nostrils daily. 30 mL 6   cetirizine  (ZYRTEC ) 10 MG tablet Take 1 tablet (10 mg total) by mouth daily. 90 tablet 1   cyclobenzaprine  (FLEXERIL ) 10 MG tablet TAKE 1 TABLET BY MOUTH TWICE A DAY AS NEEDED FOR MUSCLE SPASMS 60 tablet 0   diazepam  (VALIUM ) 5 MG tablet Take 1 by mouth 1 hour  pre-procedure with very light food. May bring 2nd tablet to appointment. 2 tablet 0   diclofenac  sodium (VOLTAREN ) 1 % GEL Apply 2 g topically 4 (four) times daily. 5 Tube 2   doxycycline  (VIBRA -TABS) 100 MG tablet Take 1 tablet (100 mg total) by mouth 2 (two) times daily. 20 tablet 0   esomeprazole  (NEXIUM ) 40 MG capsule Take 1 capsule (40 mg total) by mouth daily. 90 capsule 1   EYSUVIS 0.25 % SUSP Apply 1 drop to eye 4 (four) times daily.     ezetimibe  (ZETIA ) 10 MG tablet TAKE 1 TABLET BY MOUTH DAILY AFTER SUPPER 90 tablet 1   fluconazole  (DIFLUCAN ) 150 MG tablet Take 1 tablet (150 mg total) by mouth once a week. 2 tablet 0   fluticasone  (FLONASE ) 50 MCG/ACT nasal spray INSTILL 1 SPRAY INTO BOTH NOSTRILS DAILY 48 mL 0   Fluticasone -Umeclidin-Vilant (TRELEGY ELLIPTA ) 100-62.5-25 MCG/ACT AEPB Inhale 28 each into the lungs daily. 28 each 0   Lactobacillus CAPS Take 1 capsule by mouth daily.     lidocaine  (LIDODERM ) 5 % Place 1 patch onto the skin daily. Remove & Discard patch within 12 hours or as directed by MD 30 patch 6   miconazole  (MICATIN) 2 % cream Apply 1 application topically 2 (two) times daily. 28.35 g 0   Misc. Devices MISC 1. Rolling walker with seat 2. Shower chair 3. Elevated toilet  seat.   Diagnosis-chronic back pain 1 each 0   Misc. Devices MISC Compression stocking.  Diagnosis pedal edema Fax to [phone] [facility] 2 each 0   montelukast  (SINGULAIR ) 10 MG tablet TAKE 1 TABLET BY MOUTH EVERY DAY  90 tablet 0   Multiple Vitamin (MULTIVITAMIN WITH MINERALS) TABS tablet Take 1 tablet by mouth daily.     nicotine  (NICODERM CQ  - DOSED IN MG/24 HOURS) 21 mg/24hr patch PLACE 1 PATCH (21 MG TOTAL) ONTO THE SKIN DAILY. THEN DECREASE TO 14MG /24 HR 28 patch 2   nitrofurantoin , macrocrystal-monohydrate, (MACROBID ) 100 MG capsule Take 1 capsule (100 mg total) by mouth 2 (two) times daily. 10 capsule 0   oxybutynin  (DITROPAN ) 5 MG tablet TAKE 1 TABLET BY MOUTH TWICE A DAY 180 tablet 1   polyethylene glycol powder (GLYCOLAX /MIRALAX ) 17 GM/SCOOP powder Take 17 g by mouth daily. 3350 g 1   predniSONE  (DELTASONE ) 10 MG tablet Take 4 tabs daily with food x 4 days, then 3 tabs daily x 4 days, then 2 tabs daily x 4 days, then 1 tab daily x4 days then stop. 40 tablet 0   pregabalin  (LYRICA ) 75 MG capsule TAKE 1 CAPSULE BY MOUTH 3 TIMES DAILY. 90 capsule 4   RESTASIS  0.05 % ophthalmic emulsion Place 1 drop into both eyes daily. 0.4 mL 3   rosuvastatin  (CRESTOR ) 5 MG tablet Take 1 tablet (5 mg total) by mouth daily. 90 tablet 1   TRELEGY ELLIPTA  100-62.5-25 MCG/ACT AEPB TAKE 1 PUFF BY MOUTH EVERY DAY 60 each 5   triamcinolone  cream (KENALOG ) 0.1 % Apply 1 application topically 2 (two) times daily. 45 g 1   varenicline  (CHANTIX ) 1 MG tablet Take 1 tablet (1 mg total) by mouth 2 (two) times daily. 60 tablet 2   VENTOLIN  HFA 108 (90 Base) MCG/ACT inhaler TAKE 2 PUFFS BY MOUTH EVERY 6 HOURS AS NEEDED FOR WHEEZE OR SHORTNESS OF BREATH 18 each 5   No current facility-administered medications for this visit.   No results found.  Review of Systems:   A ROS was performed including pertinent positives and negatives as documented in the HPI.  Physical Exam :   Constitutional: NAD and  appears stated age Neurological: Alert and oriented Psych: Appropriate affect and cooperative There were no vitals taken for this visit.   Comprehensive Musculoskeletal Exam:    Left foot with severe planus formerly with calluses about the medial aspect of the ankle as well as lateral plantar foot.  Otherwise distal neurosensory exam is intact   Imaging:   Xray (3 views left foot): Severe flatfoot deformity with talonavicular osteoarthritis    I personally reviewed and interpreted the radiographs.   Assessment and Plan:   58 y.o. female with calluses about the medial and lateral plantar foot in the setting of a known severe flatfoot deformity.  At this time I do not believe she would be a surgical candidate for flatfoot correction due to her smoking.  I did discuss that this time I would recommend referral to [facility] for custom pair of orthotics of both feet given her severe flatfoot as well as callus shaving.  -Return to clinic as needed   I personally saw and evaluated the patient, and participated in the management and treatment plan.  Elspeth Parker, MD Attending Physician, Orthopedic Surgery  This document was dictated using Dragon voice recognition software. A reasonable attempt at proof reading has been made to minimize errors.

## 2023-07-18 ENCOUNTER — Encounter (HOSPITAL_BASED_OUTPATIENT_CLINIC_OR_DEPARTMENT_OTHER): Payer: Self-pay | Admitting: Pulmonary Disease

## 2023-07-18 ENCOUNTER — Ambulatory Visit (HOSPITAL_BASED_OUTPATIENT_CLINIC_OR_DEPARTMENT_OTHER): Admitting: Pulmonary Disease

## 2023-07-18 VITALS — BP 108/72 | HR 72 | Ht 65.0 in | Wt 179.9 lb

## 2023-07-18 DIAGNOSIS — M7989 Other specified soft tissue disorders: Secondary | ICD-10-CM | POA: Diagnosis not present

## 2023-07-18 DIAGNOSIS — F17210 Nicotine dependence, cigarettes, uncomplicated: Secondary | ICD-10-CM

## 2023-07-18 DIAGNOSIS — J9611 Chronic respiratory failure with hypoxia: Secondary | ICD-10-CM

## 2023-07-18 DIAGNOSIS — J449 Chronic obstructive pulmonary disease, unspecified: Secondary | ICD-10-CM

## 2023-07-18 MED ORDER — PREDNISONE 10 MG PO TABS: ORAL_TABLET | ORAL | 0 refills | Status: AC

## 2023-07-18 NOTE — Progress Notes (Signed)
   Subjective:    Patient ID: Cheryl King, female    DOB: 1965-08-23, 58 y.o.   MRN: [id number]  HPI  58 yo smoker for FU of COPD & chronic resp failure on O2 She smokes about half pack per day starting as a teenager for 40 years, more than 20 pack years   She completed pulmonary rehab and was noted to desaturate to 84% and was started on oxygen  02/2021  59month FU visit concerns about her oxygen  therapy and respiratory symptoms. according to [facility] my portable oxygen  machine isn't giving me enough oxygen .  Last OV >> on walk, POC 4L was sufficient  She experiences dyspnea, particularly exacerbated by heat. Her portable oxygen  concentrator is set at four liters but is limited to three liters due to battery constraints, affecting her mobility and daily activities. Her home oxygen  setup is adequate. She continues to smoke, reducing her consumption to a pack lasting two to three days. She experiences respiratory symptoms including phlegm production. She is on Trelegy and albuterol . She has a CT scan scheduled for July.  Significant tests/ events reviewed PFTs 02/2021 >> severe airway obstruction, ratio 52, FEV1 44%, FVC 67%, DLCO 33%   06/2022 LDCT chest RADS2 ,enlarged PA  07/2021 CT chest >> stable 5mm RLL nodule 08/2020 CT chest >> 5mm nodule RLL unchanged since 06/2019  Review of Systems neg for any significant sore throat, dysphagia, itching, sneezing, nasal congestion or excess/ purulent secretions, fever, chills, sweats, unintended wt loss, pleuritic or exertional cp, hempoptysis, orthopnea pnd or change in chronic leg swelling. Also denies presyncope, palpitations, heartburn, abdominal pain, nausea, vomiting, diarrhea or change in bowel or urinary habits, dysuria,hematuria, rash, arthralgias, visual complaints, headache, numbness weakness or ataxia.     Objective:   Physical Exam  Gen. Pleasant, well-nourished, in no distress ENT - no thrush, no pallor/icterus,no post nasal  drip Neck: No JVD, no thyromegaly, no carotid bruits Lungs: no use of accessory muscles, no dullness to percussion, clear without rales or rhonchi  Cardiovascular: Rhythm regular, heart sounds  normal, no murmurs or gallops, no peripheral edema Musculoskeletal: No deformities, no cyanosis or clubbing        Assessment & Plan:   COPD with chronic respiratory failure Chronic respiratory failure with hypoxia, currently on oxygen  therapy. Reports dyspnea, especially in heat, and concerns about POC settings. POC set to three liters, but requires four liters. Previous assessment indicated four liters was sufficient. Reports phlegm production and requested prednisone  based on maternal advice. - Ambulate on three to four liter POC to assess oxygen  delivery adequacy. - Prescribe short course of prednisone  for phlegm and potential exacerbations. - Educate on maintaining adequate oxygen  levels and potential need for alternative oxygen  delivery modes.  Leg swelling Chronic unilateral leg swelling with history of flat feet contributing to pressure and calluses. Recent podiatric evaluation ruled out surgery. Concern for potential blood clots due to asymmetrical swelling. - Evaluate for blood clots in affected leg. - Consider compression socks for swelling management.  Nicotine  dependence Continues smoking, reduced to one pack every two to three days. Acknowledges need to quit and expresses self-frustration. - Encourage continued smoking reduction and provide support for cessation efforts.

## 2023-07-20 ENCOUNTER — Ambulatory Visit: Payer: Self-pay | Admitting: Pulmonary Disease

## 2023-07-20 ENCOUNTER — Encounter (HOSPITAL_BASED_OUTPATIENT_CLINIC_OR_DEPARTMENT_OTHER)

## 2023-07-20 DIAGNOSIS — M7989 Other specified soft tissue disorders: Secondary | ICD-10-CM | POA: Diagnosis not present

## 2023-07-20 NOTE — Telephone Encounter (Signed)
 NFN needed this was sent before her OV

## 2023-07-21 ENCOUNTER — Other Ambulatory Visit: Payer: Self-pay | Admitting: Family Medicine

## 2023-07-21 ENCOUNTER — Other Ambulatory Visit: Payer: Self-pay | Admitting: Pulmonary Disease

## 2023-07-21 DIAGNOSIS — G8929 Other chronic pain: Secondary | ICD-10-CM

## 2023-07-21 NOTE — Progress Notes (Signed)
Pt notified on VM. 

## 2023-07-26 ENCOUNTER — Telehealth: Payer: Self-pay | Admitting: Family Medicine

## 2023-07-26 NOTE — Telephone Encounter (Signed)
 Called pt to confirm appt. Pt did not answer and LVM

## 2023-07-27 ENCOUNTER — Encounter: Payer: Self-pay | Admitting: Family Medicine

## 2023-07-27 ENCOUNTER — Ambulatory Visit
Admission: RE | Admit: 2023-07-27 | Discharge: 2023-07-27 | Disposition: A | Discharge status: Home / Self Care | Source: Ambulatory Visit | Attending: Acute Care | Admitting: Acute Care

## 2023-07-27 ENCOUNTER — Ambulatory Visit: Attending: Family Medicine | Admitting: Family Medicine

## 2023-07-27 VITALS — BP 107/71 | HR 70 | Wt 180.0 lb

## 2023-07-27 DIAGNOSIS — Z122 Encounter for screening for malignant neoplasm of respiratory organs: Secondary | ICD-10-CM

## 2023-07-27 DIAGNOSIS — F320 Major depressive disorder, single episode, mild: Secondary | ICD-10-CM | POA: Diagnosis not present

## 2023-07-27 DIAGNOSIS — E7800 Pure hypercholesterolemia, unspecified: Secondary | ICD-10-CM | POA: Diagnosis not present

## 2023-07-27 DIAGNOSIS — R7303 Prediabetes: Secondary | ICD-10-CM | POA: Diagnosis not present

## 2023-07-27 DIAGNOSIS — J9611 Chronic respiratory failure with hypoxia: Secondary | ICD-10-CM

## 2023-07-27 DIAGNOSIS — M5416 Radiculopathy, lumbar region: Secondary | ICD-10-CM | POA: Diagnosis not present

## 2023-07-27 DIAGNOSIS — F17210 Nicotine dependence, cigarettes, uncomplicated: Secondary | ICD-10-CM

## 2023-07-27 DIAGNOSIS — Z720 Tobacco use: Secondary | ICD-10-CM

## 2023-07-27 DIAGNOSIS — Z87891 Personal history of nicotine dependence: Secondary | ICD-10-CM

## 2023-07-27 DIAGNOSIS — J432 Centrilobular emphysema: Secondary | ICD-10-CM

## 2023-07-27 DIAGNOSIS — K219 Gastro-esophageal reflux disease without esophagitis: Secondary | ICD-10-CM

## 2023-07-27 MED ORDER — DULOXETINE HCL 60 MG PO CPEP
60.0000 mg | ORAL_CAPSULE | Freq: Every day | ORAL | 1 refills | Status: AC
Start: 2023-07-27 — End: ?

## 2023-07-27 MED ORDER — PREGABALIN 75 MG PO CAPS: 75.0000 mg | ORAL_CAPSULE | Freq: Three times a day (TID) | ORAL | 1 refills | Status: DC

## 2023-07-27 MED ORDER — AMITRIPTYLINE HCL 50 MG PO TABS: 50.0000 mg | ORAL_TABLET | Freq: Every day | ORAL | 1 refills | Status: DC

## 2023-07-27 MED ORDER — ROSUVASTATIN CALCIUM 5 MG PO TABS: 5.0000 mg | ORAL_TABLET | Freq: Every day | ORAL | 1 refills | Status: DC

## 2023-07-27 MED ORDER — ESOMEPRAZOLE MAGNESIUM 40 MG PO CPDR: 40.0000 mg | DELAYED_RELEASE_CAPSULE | Freq: Every day | ORAL | 1 refills | Status: DC

## 2023-07-27 NOTE — Patient Instructions (Signed)
 VISIT SUMMARY:  During your visit, we discussed your breathing difficulties, back pain, depression, and other health concerns. We reviewed your current medications and made some adjustments to better manage your conditions. We also talked about the importance of smoking cessation and ways to help you quit.  YOUR PLAN:  -EMPHYSEMA: Emphysema is a lung condition that causes shortness of breath. We will write a letter to the city to help you get air conditioning installed, refill your Trelegy Ellipta  and albuterol  inhaler, and continue your prednisone  as needed. It's crucial to stop smoking, so please keep using the nicotine  patches and varenicline .  -CHRONIC LOW BACK PAIN WITH SCIATICA: This condition causes pain that radiates from your lower back down your legs. Continue taking pregabalin  and cyclobenzaprine . We will refer you to water therapy, which can help relieve your symptoms. Quitting smoking is important to access more treatment options.  -DEPRESSION: Depression is a mood disorder that causes persistent feelings of sadness and loss of interest. We will reinstate your duloxetine  and amitriptyline . Please discuss with your counselor if you need to adjust the frequency of your therapy sessions.  -HYPERLIPIDEMIA: Hyperlipidemia means having high levels of fats in your blood, which can increase the risk of heart disease. We will refill your rosuvastatin  prescription to help manage this condition.  -GERD (GASTROESOPHAGEAL REFLUX DISEASE): GERD is a digestive disorder that affects the ring of muscle between your esophagus and stomach, causing heartburn. Continue taking omeprazole  to manage your symptoms.  -GENERAL HEALTH MAINTENANCE: We need to check your cholesterol levels since you haven't had blood work done since November. We will order fasting blood work for this.  INSTRUCTIONS:  Please follow up with the fasting blood work for your cholesterol levels. Continue using your medications as  prescribed and attend your therapy sessions regularly. If you have any new symptoms or concerns, schedule an appointment.

## 2023-07-27 NOTE — Progress Notes (Unsigned)
 Subjective:  Patient ID: Cheryl King, female    DOB: 1965-12-15  Age: 58 y.o. MRN: [id number]  CC: Medical Management of Chronic Issues (Patient needing a letter stating that she is on oxygen  for apartment, she has been without central air and is having trouble with breathing.)     Discussed the use of AI scribe software for clinical note transcription with the patient, who gave verbal consent to proceed.  History of Present Illness Cheryl King is a 58 year old female with  a history of Prediabetes  chronic low back pain with sciatica, GERD, hyperlipidemia,  Emphysema (on 4L oxygen ) tobacco abuse.  who presents with difficulty breathing due to lack of air conditioning in her apartment.  She experiences significant dyspnea, worsened by the absence of air conditioning. An episode involved severe coughing and inability to catch her breath. She is on 4 liters of oxygen  and uses Trelegy, albuterol , and Singulair . A recent pulmonology visit resulted in a prednisone  prescription. A check for blood clots due to leg swelling was negative.  She continues to smoke four to five cigarettes daily despite attempts to quit with patches and Chantix .  She experiences back pain with radicular symptoms and takes Lyrica  and cyclobenzaprine . She has flat feet and calluses.  She takes omeprazole  for acid reflux and requires a refill for Crestor  for hyperlipidemia.  She manages depression and anxiety with duloxetine  and amitriptyline , attends therapy biweekly, and occasionally has thoughts of self-harm, which she discusses with her therapist.  She has a small dog and faces challenges managing her health and household without adequate air conditioning, which exacerbates her respiratory symptoms.    Past Medical History:  Diagnosis Date   Acid reflux    Allergy    Carpal tunnel syndrome    COPD (chronic obstructive pulmonary disease) (HCC)    Depression    Diverticulitis    Hyperlipidemia     Low back pain    Neck pain    Sacroiliac inflammation (HCC)    Sciatica    Seasonal allergies     Past Surgical History:  Procedure Laterality Date   ABDOMINAL SURGERY     ANKLE SURGERY     lt.   BREAST EXCISIONAL BIOPSY Right    pt states years ago- not sure when   PARTIAL HYSTERECTOMY     right breast cyst removed      URETHRAL DIVERTICULUM REPAIR     uretrral diverticultis    Family History  Problem Relation Age of Onset   Healthy Mother    Other Father        Unsure of medical history   Asthma Maternal Aunt    Diabetes Maternal Aunt    Cancer Maternal Aunt    Breast cancer Maternal Aunt    Hypertension Maternal Grandmother    Heart Problems Maternal Grandmother    Hypertension Other    COPD Other    Colon cancer Neg Hx    Pancreatic cancer Neg Hx    Rectal cancer Neg Hx    Stomach cancer Neg Hx    Colon polyps Neg Hx    Esophageal cancer Neg Hx     Social History   Socioeconomic History   Marital status: Married    Spouse name: Vinie   Number of children: 0   Years of education: Bachelors   Highest education level: Bachelor's degree (e.g., BA, AB, BS)  Occupational History   Occupation: Unemployed   Occupation: past Conservation officer, nature, International aid/development worker at [employer],  office positions, finanical Buyer, retail.  Tobacco Use   Smoking status: Every Day    Current packs/day: 0.50    Average packs/day: 0.5 packs/day for 44.5 years (22.3 ttl pk-yrs)    Types: Cigarettes    Start date: 64    Passive exposure: Past (uses patches, down to 3 cigarettes a day)   Smokeless tobacco: Never   Tobacco comments:    Pt states she smokes about 2 ciggs daily.  Vaping Use   Vaping status: Never Used  Substance and Sexual Activity   Alcohol use: Not Currently    Comment: Social only - seldom   Drug use: No   Sexual activity: Never  Other Topics Concern   Not on file  Social History Narrative   Lives at home alone.   Right-handed.   Occasional caffeine  use.    Social Drivers of Corporate investment banker Strain: Low Risk  (07/26/2023)   Overall Financial Resource Strain (CARDIA)    Difficulty of Paying Living Expenses: Not very hard  Food Insecurity: No Food Insecurity (07/26/2023)   Hunger Vital Sign    Worried About Running Out of Food in the Last Year: Never true    Ran Out of Food in the Last Year: Never true  Transportation Needs: No Transportation Needs (07/26/2023)   PRAPARE - Administrator, Civil Service (Medical): No    Lack of Transportation (Non-Medical): No  Physical Activity: Inactive (07/26/2023)   Exercise Vital Sign    Days of Exercise per Week: 0 days    Minutes of Exercise per Session: Not on file  Stress: Stress Concern Present (07/26/2023)   [employer] of Occupational Health - Occupational Stress Questionnaire    Feeling of Stress: To some extent  Social Connections: Moderately Isolated (07/26/2023)   Social Connection and Isolation Panel    Frequency of Communication with Friends and Family: Twice a week    Frequency of Social Gatherings with Friends and Family: Once a week    Attends Religious Services: 1 to 4 times per year    Active Member of [employer] or Organizations: No    Attends Engineer, structural: Not on file    Marital Status: Separated    Allergies  Allergen Reactions   Percocet [Oxycodone -Acetaminophen ] Nausea Only   Vicodin [Hydrocodone -Acetaminophen ] Nausea Only   Penicillins Rash    Outpatient Medications Prior to Visit  Medication Sig Dispense Refill   acetaminophen -codeine  (TYLENOL  #3) 300-30 MG tablet TAKE 1 TABLET BY MOUTH EVERY 6 (SIX) HOURS AS NEEDED FOR MODERATE PAIN OR SEVERE PAIN 20 tablet 0   Azelastine  HCl 137 MCG/SPRAY SOLN Place 1 spray into both nostrils daily. 30 mL 6   cetirizine  (ZYRTEC ) 10 MG tablet Take 1 tablet (10 mg total) by mouth daily. 90 tablet 1   cyclobenzaprine  (FLEXERIL ) 10 MG tablet TAKE 1 TABLET BY MOUTH TWICE A DAY AS NEEDED FOR MUSCLE SPASMS  60 tablet 0   diclofenac  sodium (VOLTAREN ) 1 % GEL Apply 2 g topically 4 (four) times daily. 5 Tube 2   esomeprazole  (NEXIUM ) 40 MG capsule Take 1 capsule (40 mg total) by mouth daily. 90 capsule 1   EYSUVIS 0.25 % SUSP Apply 1 drop to eye 4 (four) times daily.     fluticasone  (FLONASE ) 50 MCG/ACT nasal spray INSTILL 1 SPRAY INTO BOTH NOSTRILS DAILY 48 mL 0   Fluticasone -Umeclidin-Vilant (TRELEGY ELLIPTA ) 100-62.5-25 MCG/ACT AEPB Inhale 28 each into the lungs daily. 28 each 0   Lactobacillus CAPS  Take 1 capsule by mouth daily.     lidocaine  (LIDODERM ) 5 % Place 1 patch onto the skin daily. Remove & Discard patch within 12 hours or as directed by MD 30 patch 6   miconazole  (MICATIN) 2 % cream Apply 1 application topically 2 (two) times daily. 28.35 g 0   Misc. Devices MISC 1. Rolling walker with seat 2. Shower chair 3. Elevated toilet seat.   Diagnosis-chronic back pain 1 each 0   Misc. Devices MISC Compression stocking.  Diagnosis pedal edema Fax to [phone] [facility] 2 each 0   montelukast  (SINGULAIR ) 10 MG tablet TAKE 1 TABLET BY MOUTH EVERY DAY 90 tablet 0   Multiple Vitamin (MULTIVITAMIN WITH MINERALS) TABS tablet Take 1 tablet by mouth daily.     nicotine  (NICODERM CQ  - DOSED IN MG/24 HOURS) 21 mg/24hr patch PLACE 1 PATCH (21 MG TOTAL) ONTO THE SKIN DAILY. THEN DECREASE TO 14MG /24 HR 28 patch 2   oxybutynin  (DITROPAN ) 5 MG tablet TAKE 1 TABLET BY MOUTH TWICE A DAY 180 tablet 1   polyethylene glycol powder (GLYCOLAX /MIRALAX ) 17 GM/SCOOP powder Take 17 g by mouth daily. 3350 g 1   predniSONE  (DELTASONE ) 10 MG tablet Take 4 tabs daily with food x 4 days, then 3 tabs daily x 4 days, then 2 tabs daily x 4 days, then 1 tab daily x4 days then stop. 40 tablet 0   pregabalin  (LYRICA ) 75 MG capsule TAKE 1 CAPSULE BY MOUTH THREE TIMES A DAY 90 capsule 1   RESTASIS  0.05 % ophthalmic emulsion Place 1 drop into both eyes daily. 0.4 mL 3   TRELEGY ELLIPTA  100-62.5-25 MCG/ACT AEPB TAKE 1  PUFF BY MOUTH EVERY DAY 60 each 5   TRELEGY ELLIPTA  100-62.5-25 MCG/ACT AEPB INHALE 1 PUFF BY MOUTH EVERY DAY 60 each 5   triamcinolone  cream (KENALOG ) 0.1 % Apply 1 application topically 2 (two) times daily. 45 g 1   varenicline  (CHANTIX ) 1 MG tablet Take 1 tablet (1 mg total) by mouth 2 (two) times daily. 60 tablet 2   VENTOLIN  HFA 108 (90 Base) MCG/ACT inhaler TAKE 2 PUFFS BY MOUTH EVERY 6 HOURS AS NEEDED FOR WHEEZE OR SHORTNESS OF BREATH 18 each 5   diazepam  (VALIUM ) 5 MG tablet Take 1 by mouth 1 hour  pre-procedure with very light food. May bring 2nd tablet to appointment. (Patient not taking: Reported on 07/27/2023) 2 tablet 0   doxycycline  (VIBRA -TABS) 100 MG tablet Take 1 tablet (100 mg total) by mouth 2 (two) times daily. (Patient not taking: Reported on 07/27/2023) 20 tablet 0   ezetimibe  (ZETIA ) 10 MG tablet TAKE 1 TABLET BY MOUTH DAILY AFTER SUPPER (Patient not taking: Reported on 07/27/2023) 90 tablet 1   fluconazole  (DIFLUCAN ) 150 MG tablet Take 1 tablet (150 mg total) by mouth once a week. (Patient not taking: Reported on 07/27/2023) 2 tablet 0   nitrofurantoin , macrocrystal-monohydrate, (MACROBID ) 100 MG capsule Take 1 capsule (100 mg total) by mouth 2 (two) times daily. (Patient not taking: Reported on 07/27/2023) 10 capsule 0   rosuvastatin  (CRESTOR ) 5 MG tablet Take 1 tablet (5 mg total) by mouth daily. (Patient not taking: Reported on 07/27/2023) 90 tablet 1   No facility-administered medications prior to visit.     ROS Review of Systems *** Objective:  BP 107/71 (BP Location: Left Arm, Patient Position: Sitting, Cuff Size: Normal)   Pulse 70   Wt 180 lb (81.6 kg)   SpO2 93%   BMI 29.95 kg/m  07/27/2023    2:13 PM 07/18/2023   10:05 AM 05/02/2023    1:38 PM  BP/Weight  Systolic BP 107 108 113  Diastolic BP 71 72 76  Wt. (Lbs) 180 179.9   BMI 29.95 kg/m2 29.94 kg/m2       Physical Exam ***    Latest Ref Rng & Units 12/06/2022   12:03 PM 06/03/2022   11:49 AM  12/28/2021    6:38 PM  CMP  Glucose 70 - 99 mg/dL 73  80  873   BUN 6 - 24 mg/dL 16  14  13    Creatinine 0.57 - 1.00 mg/dL 9.25  9.18  9.27   Sodium 134 - 144 mmol/L 139  140  137   Potassium 3.5 - 5.2 mmol/L 4.5  4.6  3.9   Chloride 96 - 106 mmol/L 100  101  105   CO2 20 - 29 mmol/L 23  24  25    Calcium  8.7 - 10.2 mg/dL 9.7  9.4  9.0   Total Protein 6.0 - 8.5 g/dL 7.3  6.7  7.1   Total Bilirubin 0.0 - 1.2 mg/dL 0.3  0.3  0.3   Alkaline Phos 44 - 121 IU/L 141  126  94   AST 0 - 40 IU/L 22  28  29    ALT 0 - 32 IU/L 14  22  22      Lipid Panel     Component Value Date/Time   CHOL 170 06/03/2022 1149   TRIG 71 06/03/2022 1149   HDL 72 06/03/2022 1149   CHOLHDL 3.2 01/28/2020 1012   CHOLHDL 3.2 12/26/2013 1319   VLDL 18 12/26/2013 1319   LDLCALC 85 06/03/2022 1149    CBC    Component Value Date/Time   WBC 7.5 06/03/2022 1149   WBC 8.9 12/28/2021 1838   RBC 4.17 06/03/2022 1149   RBC 4.43 12/28/2021 1838   HGB 13.2 06/03/2022 1149   HCT 39.8 06/03/2022 1149   PLT 207 06/03/2022 1149   MCV 95 06/03/2022 1149   MCH 31.7 06/03/2022 1149   MCH 31.4 12/28/2021 1838   MCHC 33.2 06/03/2022 1149   MCHC 33.3 12/28/2021 1838   RDW 13.8 06/03/2022 1149   LYMPHSABS 2.3 06/03/2022 1149   MONOABS 1.2 (H) 12/28/2021 1838   EOSABS 0.1 06/03/2022 1149   BASOSABS 0.0 06/03/2022 1149    Lab Results  Component Value Date   HGBA1C 5.7 12/06/2022      There are no diagnoses linked to this encounter.  No orders of the defined types were placed in this encounter.   Follow-up: No follow-ups on file.       Corrina Sabin, MD, FAAFP. [facility] [place], [place] [phone]   07/27/2023, 2:57 PM

## 2023-07-28 ENCOUNTER — Encounter: Payer: Self-pay | Admitting: Family Medicine

## 2023-07-28 ENCOUNTER — Ambulatory Visit: Payer: Self-pay | Admitting: Family Medicine

## 2023-07-28 DIAGNOSIS — E7800 Pure hypercholesterolemia, unspecified: Secondary | ICD-10-CM

## 2023-07-28 LAB — CMP14+EGFR
ALT: 11 IU/L (ref 0–32)
AST: 18 IU/L (ref 0–40)
Albumin: 4.1 g/dL (ref 3.8–4.9)
Alkaline Phosphatase: 145 IU/L — ABNORMAL HIGH (ref 44–121)
BUN/Creatinine Ratio: 14 (ref 12–28)
BUN: 11 mg/dL (ref 8–27)
Bilirubin Total: 0.4 mg/dL (ref 0.0–1.2)
CO2: 25 mmol/L (ref 20–29)
Calcium: 9.7 mg/dL (ref 8.7–10.3)
Chloride: 104 mmol/L (ref 96–106)
Creatinine, Ser: 0.77 mg/dL (ref 0.57–1.00)
Globulin, Total: 2.9 g/dL (ref 1.5–4.5)
Glucose: 80 mg/dL (ref 70–99)
Potassium: 4.5 mmol/L (ref 3.5–5.2)
Sodium: 142 mmol/L (ref 134–144)
Total Protein: 7 g/dL (ref 6.0–8.5)
eGFR: 88 mL/min/1.73 (ref >59)

## 2023-07-28 LAB — CBC WITH DIFFERENTIAL/PLATELET
Basophils Absolute: 0.1 x10E3/uL (ref 0.0–0.2)
Basos: 1 %
EOS (ABSOLUTE): 0.2 x10E3/uL (ref 0.0–0.4)
Eos: 2 %
Hematocrit: 39.1 % (ref 34.0–46.6)
Hemoglobin: 12.7 g/dL (ref 11.1–15.9)
Immature Grans (Abs): 0 x10E3/uL (ref 0.0–0.1)
Immature Granulocytes: 0 %
Lymphocytes Absolute: 2.9 x10E3/uL (ref 0.7–3.1)
Lymphs: 31 %
MCH: 30.1 pg (ref 26.6–33.0)
MCHC: 32.5 g/dL (ref 31.5–35.7)
MCV: 93 fL (ref 79–97)
Monocytes Absolute: 0.9 x10E3/uL (ref 0.1–0.9)
Monocytes: 10 %
Neutrophils Absolute: 5.3 x10E3/uL (ref 1.4–7.0)
Neutrophils: 56 %
Platelets: 254 x10E3/uL (ref 150–450)
RBC: 4.22 x10E6/uL (ref 3.77–5.28)
RDW: 14.7 % (ref 11.7–15.4)
WBC: 9.4 x10E3/uL (ref 3.4–10.8)

## 2023-07-28 LAB — HEMOGLOBIN A1C
Est. average glucose Bld gHb Est-mCnc: 123 mg/dL
Hgb A1c MFr Bld: 5.9 % — ABNORMAL HIGH (ref 4.8–5.6)

## 2023-07-28 LAB — LP+NON-HDL CHOLESTEROL
Cholesterol, Total: 202 mg/dL — ABNORMAL HIGH (ref 100–199)
HDL: 60 mg/dL (ref >39)
LDL Chol Calc (NIH): 129 mg/dL — ABNORMAL HIGH (ref 0–99)
Total Non-HDL-Chol (LDL+VLDL): 142 mg/dL — ABNORMAL HIGH (ref 0–129)
Triglycerides: 72 mg/dL (ref 0–149)
VLDL Cholesterol Cal: 13 mg/dL (ref 5–40)

## 2023-07-28 MED ORDER — ROSUVASTATIN CALCIUM 10 MG PO TABS: 10.0000 mg | ORAL_TABLET | Freq: Every day | ORAL | 1 refills | Status: AC

## 2023-08-02 ENCOUNTER — Other Ambulatory Visit: Payer: Self-pay | Admitting: Family Medicine

## 2023-08-04 ENCOUNTER — Telehealth: Payer: Self-pay

## 2023-08-04 NOTE — Telephone Encounter (Signed)
 Copied from CRM [id number]. Topic: Clinical - Lab/Test Results >> Aug 04, 2023  2:11 PM Shona S wrote: Reason for CRM: patient calling to check on status of ct scan. Please provide results through mychart or call patient.  Please advise once [facility] CT results are available

## 2023-08-08 ENCOUNTER — Ambulatory Visit (INDEPENDENT_AMBULATORY_CARE_PROVIDER_SITE_OTHER)

## 2023-08-08 ENCOUNTER — Encounter: Payer: Self-pay | Admitting: Podiatry

## 2023-08-08 ENCOUNTER — Ambulatory Visit: Admitting: Podiatry

## 2023-08-08 ENCOUNTER — Other Ambulatory Visit: Payer: Self-pay | Admitting: Acute Care

## 2023-08-08 VITALS — Ht 65.0 in | Wt 180.0 lb

## 2023-08-08 DIAGNOSIS — M2141 Flat foot [pes planus] (acquired), right foot: Secondary | ICD-10-CM

## 2023-08-08 DIAGNOSIS — M7752 Other enthesopathy of left foot: Secondary | ICD-10-CM

## 2023-08-08 DIAGNOSIS — D2372 Other benign neoplasm of skin of left lower limb, including hip: Secondary | ICD-10-CM

## 2023-08-08 DIAGNOSIS — Z87891 Personal history of nicotine dependence: Secondary | ICD-10-CM

## 2023-08-08 DIAGNOSIS — M2142 Flat foot [pes planus] (acquired), left foot: Secondary | ICD-10-CM

## 2023-08-08 DIAGNOSIS — M19072 Primary osteoarthritis, left ankle and foot: Secondary | ICD-10-CM

## 2023-08-08 DIAGNOSIS — F17210 Nicotine dependence, cigarettes, uncomplicated: Secondary | ICD-10-CM

## 2023-08-08 DIAGNOSIS — Z122 Encounter for screening for malignant neoplasm of respiratory organs: Secondary | ICD-10-CM

## 2023-08-08 MED ORDER — BETAMETHASONE SOD PHOS & ACET 6 (3-3) MG/ML IJ SUSP
3.0000 mg | Freq: Once | INTRAMUSCULAR | Status: AC
Start: 2023-08-08 — End: 2023-08-08
  Administered 2023-08-08: 3 mg via INTRA_ARTICULAR

## 2023-08-08 NOTE — Progress Notes (Signed)
 Chief Complaint  Patient presents with   Callouses    Pt is here due to callous on the bottom of both feet.    Subjective: 58 y.o. female PMHx COPD on oxygen  presenting to the office today for evaluation of symptomatic skin lesions to the bilateral feet.  She also has progressive chronic pain to the left foot and ankle secondary to history of ankle fracture and subsequent removal of the hardware several years prior.  Referred by Dr. Genelle, [facility], for possible custom orthotics.   Past Medical History:  Diagnosis Date   Acid reflux    Allergy    Carpal tunnel syndrome    COPD (chronic obstructive pulmonary disease) (HCC)    Depression    Diverticulitis    Hyperlipidemia    Low back pain    Neck pain    Sacroiliac inflammation (HCC)    Sciatica    Seasonal allergies     Past Surgical History:  Procedure Laterality Date   ABDOMINAL SURGERY     ANKLE SURGERY     lt.   BREAST EXCISIONAL BIOPSY Right    pt states years ago- not sure when   PARTIAL HYSTERECTOMY     right breast cyst removed      URETHRAL DIVERTICULUM REPAIR     uretrral diverticultis    Allergies  Allergen Reactions   Percocet [Oxycodone -Acetaminophen ] Nausea Only   Vicodin [Hydrocodone -Acetaminophen ] Nausea Only   Penicillins Rash     Objective:  Physical Exam General: Alert and oriented x3 in no acute distress  Dermatology: Hyperkeratotic lesion(s) present on the plantar aspect of the bilateral fourth MTP as well as the navicular tuberosity bilateral. Pain on palpation with a central nucleated core noted. Skin is warm, dry and supple bilateral lower extremities. Negative for open lesions or macerations.  Vascular: Palpable pedal pulses bilaterally. No edema or erythema noted. Capillary refill within normal limits.  Neurological: Grossly intact via light touch  Musculoskeletal Exam: Pain on palpation at the keratotic lesion(s) noted.  Pes planovalgus deformity noted left foot  compared to the contralateral limb.  Associated tenderness with palpation and range of motion of the ankle joint as well.  Radiographic exam B/L feet 08/08/2023: Pes planovalgus deformity noted with collapse of the medial longitudinal arch of the foot and decreased calcaneal inclination of metatarsal declination angle.  Advanced severe degenerative changes noted to the talonavicular joint left foot.  As best can be visualized there also is some moderate degenerative changes noted to the tibiotalar joint.  Impression: Osteoarthritis TN and tibiotalar joint left  Assessment: 1.  Symptomatic benign skin lesions bilateral feet 2.  Pes planovalgus deformity left foot 3.  History of left ankle fracture and surgery with subsequent ROH 4.  Advanced TN osteoarthritis left   Plan of Care:  -Patient evaluated -Excisional debridement of keratoic lesion(s) using a chisel blade was performed without incident.  -Salicylic acid applied with a bandaid - Advised against going barefoot.  Recommend arch supports and orthotics to support the medial longitudinal arch of the foot and offload pressure from the symptomatic calluses to the forefoot as well as support the flatfoot deformity to the left -Custom vs OTC prefabricated arch supports discussed.  Unfortunately insurance would not cover custom orthotics and they are not financially feasible for the patient.  Recommend OTC prefabricated insoles/arch supports -Injection of 0.5 cc Celestone  Soluspan injected into the left ankle joint medial aspect -Ultimately I do believe the patient would benefit from surgery to correct for  her symptomatic flatfoot deformity to better align the foot and allow for ambulation without pain.  For now we will continue to pursue conservative treatment however -Return to clinic PRN  Thresa EMERSON Sar, DPM [facility]  Dr. Thresa EMERSON Sar, DPM    [address] [place], [place] [address]                 Office [phone]  Fax [phone]

## 2023-08-09 NOTE — Telephone Encounter (Signed)
 Patient is calling back concerning results for her ct did read the message to patient that I seen from nurse allison . Klet the patient know that anything patient records are uploaded to mychart doesn't mean the doctor has seen them . Please give patient a call asap concerning those  results when they have been viewed . Patient is saying she can see results in her mychart and I did explain the reason of that

## 2023-08-09 NOTE — Telephone Encounter (Signed)
 Returned call and reviewed recent CT results. NFN.

## 2023-08-11 ENCOUNTER — Encounter: Payer: Self-pay | Admitting: Family Medicine

## 2023-08-12 ENCOUNTER — Other Ambulatory Visit: Payer: Self-pay | Admitting: Family Medicine

## 2023-08-12 MED ORDER — DOXYCYCLINE HYCLATE 100 MG PO TABS
100.0000 mg | ORAL_TABLET | Freq: Two times a day (BID) | ORAL | 0 refills | Status: AC
Start: 2023-08-12 — End: ?

## 2023-08-13 ENCOUNTER — Other Ambulatory Visit: Payer: Self-pay | Admitting: Family Medicine

## 2023-08-13 DIAGNOSIS — K219 Gastro-esophageal reflux disease without esophagitis: Secondary | ICD-10-CM

## 2023-08-21 ENCOUNTER — Other Ambulatory Visit: Payer: Self-pay | Admitting: Orthopedic Surgery

## 2023-09-04 ENCOUNTER — Other Ambulatory Visit: Payer: Self-pay | Admitting: Family Medicine

## 2023-09-10 ENCOUNTER — Other Ambulatory Visit: Payer: Self-pay | Admitting: Family Medicine

## 2023-09-12 NOTE — Telephone Encounter (Signed)
 Requested Prescriptions  Pending Prescriptions Disp Refills   montelukast  (SINGULAIR ) 10 MG tablet [Pharmacy Med Name: MONTELUKAST  SOD 10 MG TABLET] 90 tablet 0    Sig: TAKE 1 TABLET BY MOUTH EVERY DAY     Pulmonology:  Leukotriene Inhibitors Passed - 09/12/2023  2:30 PM      Passed - Valid encounter within last 12 months    Recent Outpatient Visits           1 month ago Prediabetes   [place] [facility] [place]. [facility] Delbert Clam, MD   6 months ago Upper respiratory tract infection, unspecified type   [place] [facility] Of [place]. [facility] Delbert Clam, MD   9 months ago Prediabetes   [place] [facility] [place]. [facility] Delbert Clam, MD   1 year ago Tobacco abuse   [place] [facility] Dept Of [place]. [facility] Delbert Clam, MD   1 year ago Chronic respiratory failure with hypoxia [facility])    [facility] [facility] [place]. [facility] Delbert Clam, MD       Future Appointments             In 2 weeks Georgina Ozell LABOR, MD [facility]

## 2023-09-14 ENCOUNTER — Encounter (HOSPITAL_BASED_OUTPATIENT_CLINIC_OR_DEPARTMENT_OTHER): Payer: Self-pay | Admitting: Emergency Medicine

## 2023-09-14 ENCOUNTER — Emergency Department (HOSPITAL_BASED_OUTPATIENT_CLINIC_OR_DEPARTMENT_OTHER)

## 2023-09-14 ENCOUNTER — Other Ambulatory Visit: Payer: Self-pay

## 2023-09-14 ENCOUNTER — Emergency Department (HOSPITAL_BASED_OUTPATIENT_CLINIC_OR_DEPARTMENT_OTHER): Admitting: Radiology

## 2023-09-14 ENCOUNTER — Emergency Department (HOSPITAL_BASED_OUTPATIENT_CLINIC_OR_DEPARTMENT_OTHER)
Admission: EM | Admit: 2023-09-14 | Discharge: 2023-09-14 | Disposition: A | Discharge status: Home / Self Care | Attending: Emergency Medicine | Admitting: Emergency Medicine

## 2023-09-14 DIAGNOSIS — J449 Chronic obstructive pulmonary disease, unspecified: Secondary | ICD-10-CM | POA: Diagnosis not present

## 2023-09-14 DIAGNOSIS — M25562 Pain in left knee: Secondary | ICD-10-CM | POA: Insufficient documentation

## 2023-09-14 DIAGNOSIS — Y92410 Unspecified street and highway as the place of occurrence of the external cause: Secondary | ICD-10-CM | POA: Insufficient documentation

## 2023-09-14 DIAGNOSIS — S0990XA Unspecified injury of head, initial encounter: Secondary | ICD-10-CM | POA: Diagnosis present

## 2023-09-14 DIAGNOSIS — M79672 Pain in left foot: Secondary | ICD-10-CM | POA: Diagnosis not present

## 2023-09-14 DIAGNOSIS — M542 Cervicalgia: Secondary | ICD-10-CM | POA: Diagnosis not present

## 2023-09-14 DIAGNOSIS — Z7951 Long term (current) use of inhaled steroids: Secondary | ICD-10-CM | POA: Insufficient documentation

## 2023-09-14 DIAGNOSIS — M549 Dorsalgia, unspecified: Secondary | ICD-10-CM | POA: Insufficient documentation

## 2023-09-14 MED ORDER — ACETAMINOPHEN 325 MG PO TABS
650.0000 mg | ORAL_TABLET | Freq: Once | ORAL | Status: AC
  Administered 2023-09-14: 650 mg via ORAL
  Filled 2023-09-14: qty 2

## 2023-09-14 MED ORDER — ACETAMINOPHEN-CODEINE 300-30 MG PO TABS
1.0000 | ORAL_TABLET | Freq: Three times a day (TID) | ORAL | 0 refills | Status: DC | PRN
Start: 2023-09-14 — End: 2023-11-21
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
## 2023-09-14 NOTE — Discharge Instructions (Addendum)
 Your workup today was reassuring.  Imaging of your left knee, foot, neck, headache, back all were reassuring.  You may continue to take the Tylenol  3 you are prescribed at home for treatment of your pain.  You also may ice the areas that are swollen or causing pain.  Expect a feel worse over the next 2 to 3 days before you begin to feel better.  Recommend follow-up with your primary care for reassessment.  Will also attach number for orthopedic regarding your left foot/ankle pain if this continues to cause problems.

## 2023-09-14 NOTE — ED Notes (Signed)
 ED Provider at bedside.

## 2023-09-14 NOTE — ED Notes (Signed)
 Reviewed discharge instructions, medications, and home care with pt. Pt verbalized understanding and had no further questions. Pt exited ED without complications.

## 2023-09-26 ENCOUNTER — Encounter: Payer: Self-pay | Admitting: Family Medicine

## 2023-09-26 ENCOUNTER — Ambulatory Visit: Admitting: Orthopedic Surgery

## 2023-09-26 ENCOUNTER — Telehealth: Payer: Self-pay | Admitting: Physical Medicine and Rehabilitation

## 2023-09-26 DIAGNOSIS — M7062 Trochanteric bursitis, left hip: Secondary | ICD-10-CM | POA: Diagnosis not present

## 2023-09-26 DIAGNOSIS — M7061 Trochanteric bursitis, right hip: Secondary | ICD-10-CM
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
## 2023-09-26 NOTE — Telephone Encounter (Signed)
Patient was here. Would like an appointment with Dr. Ernestina Patches

## 2023-09-28 ENCOUNTER — Telehealth (HOSPITAL_BASED_OUTPATIENT_CLINIC_OR_DEPARTMENT_OTHER): Admitting: Family Medicine

## 2023-09-28 ENCOUNTER — Encounter: Payer: Self-pay | Admitting: Family Medicine

## 2023-09-28 DIAGNOSIS — J9611 Chronic respiratory failure with hypoxia: Secondary | ICD-10-CM | POA: Diagnosis not present

## 2023-09-28 DIAGNOSIS — J069 Acute upper respiratory infection, unspecified: Secondary | ICD-10-CM | POA: Diagnosis not present

## 2023-09-28 DIAGNOSIS — J432 Centrilobular emphysema: Secondary | ICD-10-CM

## 2023-09-28 MED ORDER — MISC. DEVICES MISC: 0 refills | Status: AC

## 2023-09-28 MED ORDER — AZITHROMYCIN 250 MG PO TABS: ORAL_TABLET | ORAL | 0 refills | Status: AC

## 2023-09-28 MED ORDER — ALBUTEROL SULFATE (2.5 MG/3ML) 0.083% IN NEBU: 2.5000 mg | INHALATION_SOLUTION | Freq: Four times a day (QID) | RESPIRATORY_TRACT | 1 refills | Status: AC | PRN

## 2023-09-28 NOTE — Progress Notes (Signed)
 Virtual Visit via Video Note  I connected with Cheryl King, on 09/28/2023 at 12:55 PM by video enabled telemedicine device and verified that I am speaking with the correct person using two identifiers.   Consent: I discussed the limitations, risks, security and privacy concerns of performing an evaluation and management service by telemedicine and the availability of in person appointments. I also discussed with the patient that there may be a patient responsible charge related to this service. The patient expressed understanding and agreed to proceed.   Location of Patient: Home  Location of Provider: [place]   Persons participating in Telemedicine visit: Aneya Daddona Dr. Delbert    Discussed the use of AI scribe software for clinical note transcription with the patient, who gave verbal consent to proceed.  History of Present Illness Cheryl King is a 58 year old female with  a history of Prediabetes  chronic low back pain with sciatica, GERD, hyperlipidemia,  Emphysema (on 4L oxygen ) tobacco abuse  who presents with symptoms of an upper respiratory infection.  She has experienced nasal congestion, difficulty breathing, and a productive cough with phlegm for four to five days. A headache was present for a couple of days but has resolved. There is no sinus pain, sore throat, or difficulty swallowing.  Blood pressure has fluctuated, with a low of 88/68 mmHg for two days, later increasing to 115/74 mmHg. Oxygen  saturation was 94% yesterday.  She uses Trelegy and Albuterol  inhalers for emphysema management. She has not used prednisone  recently despite having a prescription. She is allergic to penicillin and was prescribed doxycycline  in July for URI.      Past Medical History:  Diagnosis Date   Acid reflux    Allergy    Carpal tunnel syndrome    COPD (chronic obstructive pulmonary disease) (HCC)    Depression    Diverticulitis    Hyperlipidemia    Low back  pain    Neck pain    Sacroiliac inflammation (HCC)    Sciatica    Seasonal allergies    Allergies  Allergen Reactions   Percocet [Oxycodone -Acetaminophen ] Nausea Only   Vicodin [Hydrocodone -Acetaminophen ] Nausea Only   Penicillins Rash    Current Outpatient Medications on File Prior to Visit  Medication Sig Dispense Refill   acetaminophen -codeine  (TYLENOL  #3) 300-30 MG tablet Take 1 tablet by mouth every 8 (eight) hours as needed for severe pain (pain score 7-10). 5 tablet 0   amitriptyline  (ELAVIL ) 50 MG tablet TAKE 1 TABLET BY MOUTH EVERYDAY AT BEDTIME 90 tablet 1   Azelastine  HCl 137 MCG/SPRAY SOLN Place 1 spray into both nostrils daily. 30 mL 6   cetirizine  (ZYRTEC ) 10 MG tablet Take 1 tablet (10 mg total) by mouth daily. 90 tablet 1   cyclobenzaprine  (FLEXERIL ) 10 MG tablet TAKE 1 TABLET BY MOUTH TWICE A DAY AS NEEDED FOR MUSCLE SPASMS 60 tablet 2   diclofenac  sodium (VOLTAREN ) 1 % GEL Apply 2 g topically 4 (four) times daily. 5 Tube 2   doxycycline  (VIBRA -TABS) 100 MG tablet Take 1 tablet (100 mg total) by mouth 2 (two) times daily. 14 tablet 0   DULoxetine  (CYMBALTA ) 60 MG capsule Take 1 capsule (60 mg total) by mouth daily. 90 capsule 1   esomeprazole  (NEXIUM ) 40 MG capsule TAKE 1 CAPSULE (40 MG TOTAL) BY MOUTH DAILY. 90 capsule 1   EYSUVIS 0.25 % SUSP Apply 1 drop to eye 4 (four) times daily.     ezetimibe  (ZETIA ) 10 MG tablet TAKE 1 TABLET  BY MOUTH DAILY AFTER SUPPER (Patient not taking: Reported on 08/08/2023) 90 tablet 1   fluticasone  (FLONASE ) 50 MCG/ACT nasal spray INSTILL 1 SPRAY INTO BOTH NOSTRILS DAILY 48 mL 0   Fluticasone -Umeclidin-Vilant (TRELEGY ELLIPTA ) 100-62.5-25 MCG/ACT AEPB Inhale 28 each into the lungs daily. 28 each 0   Lactobacillus CAPS Take 1 capsule by mouth daily.     lidocaine  (LIDODERM ) 5 % Place 1 patch onto the skin daily. Remove & Discard patch within 12 hours or as directed by MD 30 patch 6   miconazole  (MICATIN) 2 % cream Apply 1 application  topically 2 (two) times daily. 28.35 g 0   Misc. Devices MISC 1. Rolling walker with seat 2. Shower chair 3. Elevated toilet seat.   Diagnosis-chronic back pain 1 each 0   Misc. Devices MISC Compression stocking.  Diagnosis pedal edema Fax to [phone] [employer] 2 each 0   montelukast  (SINGULAIR ) 10 MG tablet TAKE 1 TABLET BY MOUTH EVERY DAY 90 tablet 0   Multiple Vitamin (MULTIVITAMIN WITH MINERALS) TABS tablet Take 1 tablet by mouth daily.     nicotine  (NICODERM CQ  - DOSED IN MG/24 HOURS) 21 mg/24hr patch PLACE 1 PATCH (21 MG TOTAL) ONTO THE SKIN DAILY. THEN DECREASE TO 14MG /24 HR 28 patch 2   oxybutynin  (DITROPAN ) 5 MG tablet TAKE 1 TABLET BY MOUTH TWICE A DAY 180 tablet 1   polyethylene glycol powder (GLYCOLAX /MIRALAX ) 17 GM/SCOOP powder Take 17 g by mouth daily. 3350 g 1   predniSONE  (DELTASONE ) 10 MG tablet Take 4 tabs daily with food x 4 days, then 3 tabs daily x 4 days, then 2 tabs daily x 4 days, then 1 tab daily x4 days then stop. 40 tablet 0   pregabalin  (LYRICA ) 75 MG capsule Take 1 capsule (75 mg total) by mouth 3 (three) times daily. 270 capsule 1   RESTASIS  0.05 % ophthalmic emulsion Place 1 drop into both eyes daily. 0.4 mL 3   rosuvastatin  (CRESTOR ) 10 MG tablet Take 1 tablet (10 mg total) by mouth daily. 90 tablet 1   TRELEGY ELLIPTA  100-62.5-25 MCG/ACT AEPB TAKE 1 PUFF BY MOUTH EVERY DAY 60 each 5   TRELEGY ELLIPTA  100-62.5-25 MCG/ACT AEPB INHALE 1 PUFF BY MOUTH EVERY DAY 60 each 5   triamcinolone  cream (KENALOG ) 0.1 % Apply 1 application topically 2 (two) times daily. 45 g 1   varenicline  (CHANTIX ) 1 MG tablet Take 1 tablet (1 mg total) by mouth 2 (two) times daily. 60 tablet 2   VENTOLIN  HFA 108 (90 Base) MCG/ACT inhaler TAKE 2 PUFFS BY MOUTH EVERY 6 HOURS AS NEEDED FOR WHEEZE OR SHORTNESS OF BREATH 18 each 5   No current facility-administered medications on file prior to visit.    ROS: See HPI  Observations/Objective: Awake, alert, oriented x3 Not in  acute distress Normal mood      Latest Ref Rng & Units 07/27/2023    3:39 PM 12/06/2022   12:03 PM 06/03/2022   11:49 AM  CMP  Glucose 70 - 99 mg/dL 80  73  80   BUN 8 - 27 mg/dL 11  16  14    Creatinine 0.57 - 1.00 mg/dL 9.22  9.25  9.18   Sodium 134 - 144 mmol/L 142  139  140   Potassium 3.5 - 5.2 mmol/L 4.5  4.5  4.6   Chloride 96 - 106 mmol/L 104  100  101   CO2 20 - 29 mmol/L 25  23  24  Calcium  8.7 - 10.3 mg/dL 9.7  9.7  9.4   Total Protein 6.0 - 8.5 g/dL 7.0  7.3  6.7   Total Bilirubin 0.0 - 1.2 mg/dL 0.4  0.3  0.3   Alkaline Phos 44 - 121 IU/L 145  141  126   AST 0 - 40 IU/L 18  22  28    ALT 0 - 32 IU/L 11  14  22      Lipid Panel     Component Value Date/Time   CHOL 202 (H) 07/27/2023 1539   TRIG 72 07/27/2023 1539   HDL 60 07/27/2023 1539   CHOLHDL 3.2 01/28/2020 1012   CHOLHDL 3.2 12/26/2013 1319   VLDL 18 12/26/2013 1319   LDLCALC 129 (H) 07/27/2023 1539   LABVLDL 13 07/27/2023 1539    Lab Results  Component Value Date   HGBA1C 5.9 (H) 07/27/2023     Assessment and plan:  Assessment & Plan Acute upper respiratory infection Acute upper respiratory infection with nasal congestion, productive cough, wheezing, dyspnea, and right-sided lymphadenopathy. Initial hypotension improved. Oxygen  saturation at 94%. - Prescribed azithromycin : 2 tablets on day 1, then 1 tablet on days 2 to 5. - Instructed to take prednisone  10 mg daily for 5 days. - Prescribed albuterol  nebulizer solution every 6 hours as needed. - Sent prescription for nebulizer machine to medical equipment company for delivery. - Advised to monitor symptoms and use nebulizer if symptoms persist after using inhalers.  Emphysema with chronic respiratory failure with hypoxia Chronic condition with increased risk of respiratory infections. Current exacerbation due to upper respiratory infection. Lacks nebulizer for effective management. - Ensure use of Trelegy and albuterol  inhalers as prescribed. -  Advised to obtain a flu shot once symptoms improve.      Meds ordered this encounter  Medications   azithromycin  (ZITHROMAX ) 250 MG tablet    Sig: Take 2 tablets on day 1, then 1 tablet daily on days 2 through 5    Dispense:  6 tablet    Refill:  0   albuterol  (PROVENTIL ) (2.5 MG/3ML) 0.083% nebulizer solution    Sig: Take 3 mLs (2.5 mg total) by nebulization every 6 (six) hours as needed for wheezing or shortness of breath.    Dispense:  150 mL    Refill:  1   Misc. Devices MISC    Sig: Nebulizer device. Diagnosis - Emphysema    Dispense:  1 each    Refill:  0    Follow Up Instructions: Keep previously scheduled appointment   I discussed the assessment and treatment plan with the patient. The patient was provided an opportunity to ask questions and all were answered. The patient agreed with the plan and demonstrated an understanding of the instructions.   The patient was advised to call back or seek an in-person evaluation if the symptoms worsen or if the condition fails to improve as anticipated.     I provided 13 minutes total of Telehealth time during this encounter including median intraservice time, reviewing previous notes, investigations, ordering medications, medical decision making, coordinating care and patient verbalized understanding at the end of the visit.     Corrina Sabin, MD, FAAFP. [facility] [place], [place] [phone]   09/28/2023, 12:55 PM

## 2023-09-28 NOTE — Patient Instructions (Signed)
 VISIT SUMMARY:  Today, you were seen for symptoms of an upper respiratory infection, including nasal congestion, difficulty breathing, and a productive cough. Your blood pressure and oxygen  levels were monitored, and you were provided with a treatment plan to manage your symptoms and your underlying emphysema.  YOUR PLAN:  -ACUTE UPPER RESPIRATORY INFECTION: An acute upper respiratory infection is a viral or bacterial infection that affects the nose, throat, and airways. You have been prescribed azithromycin  (2 tablets on day 1, then 1 tablet on days 2 to 5) and prednisone  (10 mg daily for 5 days) to help manage your symptoms. Additionally, you should use the albuterol  nebulizer solution every 6 hours as needed. A nebulizer machine will be delivered to you. Please monitor your symptoms and use the nebulizer if your symptoms persist after using your inhalers.  -EMPHYSEMA WITH CHRONIC RESPIRATORY FAILURE WITH HYPOXIA: Emphysema is a chronic lung condition that causes shortness of breath and increases the risk of respiratory infections. Continue using your Trelegy and albuterol  inhalers as prescribed. Once your symptoms improve, it is recommended that you get a flu shot to help prevent future infections.  INSTRUCTIONS:  Please follow the prescribed medication regimen and monitor your symptoms closely. If your symptoms do not improve or worsen, contact our office. Once your symptoms have improved, schedule an appointment to receive a flu shot.

## 2023-10-03 ENCOUNTER — Encounter: Payer: Self-pay | Admitting: Physical Medicine and Rehabilitation

## 2023-10-03 ENCOUNTER — Ambulatory Visit: Admitting: Physical Medicine and Rehabilitation

## 2023-10-03 DIAGNOSIS — G8929 Other chronic pain: Secondary | ICD-10-CM

## 2023-10-03 DIAGNOSIS — M5442 Lumbago with sciatica, left side: Secondary | ICD-10-CM | POA: Diagnosis not present

## 2023-10-03 DIAGNOSIS — M5416 Radiculopathy, lumbar region: Secondary | ICD-10-CM | POA: Diagnosis not present

## 2023-10-03 DIAGNOSIS — M5441 Lumbago with sciatica, right side: Secondary | ICD-10-CM | POA: Diagnosis not present
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
## 2023-10-04 ENCOUNTER — Other Ambulatory Visit: Payer: Self-pay | Admitting: Family Medicine

## 2023-10-04 ENCOUNTER — Other Ambulatory Visit: Payer: Self-pay

## 2023-10-04 ENCOUNTER — Ambulatory Visit (HOSPITAL_BASED_OUTPATIENT_CLINIC_OR_DEPARTMENT_OTHER): Attending: Family Medicine | Admitting: Physical Therapy

## 2023-10-04 ENCOUNTER — Encounter (HOSPITAL_BASED_OUTPATIENT_CLINIC_OR_DEPARTMENT_OTHER): Payer: Self-pay | Admitting: Physical Therapy

## 2023-10-04 DIAGNOSIS — M5416 Radiculopathy, lumbar region: Secondary | ICD-10-CM

## 2023-10-04 DIAGNOSIS — M6281 Muscle weakness (generalized): Secondary | ICD-10-CM | POA: Diagnosis present

## 2023-10-04 DIAGNOSIS — R1310 Dysphagia, unspecified: Secondary | ICD-10-CM | POA: Diagnosis present

## 2023-10-04 DIAGNOSIS — M5459 Other low back pain: Secondary | ICD-10-CM | POA: Diagnosis present

## 2023-10-10 ENCOUNTER — Other Ambulatory Visit: Payer: Self-pay | Admitting: Family Medicine

## 2023-10-10 DIAGNOSIS — K5909 Other constipation: Secondary | ICD-10-CM

## 2023-10-11 ENCOUNTER — Ambulatory Visit (HOSPITAL_BASED_OUTPATIENT_CLINIC_OR_DEPARTMENT_OTHER): Admitting: Physical Therapy

## 2023-10-11 ENCOUNTER — Encounter (HOSPITAL_BASED_OUTPATIENT_CLINIC_OR_DEPARTMENT_OTHER): Payer: Self-pay | Admitting: Physical Therapy

## 2023-10-11 DIAGNOSIS — R1310 Dysphagia, unspecified: Secondary | ICD-10-CM

## 2023-10-11 DIAGNOSIS — M5459 Other low back pain: Secondary | ICD-10-CM

## 2023-10-11 DIAGNOSIS — M6281 Muscle weakness (generalized): Secondary | ICD-10-CM

## 2023-10-11 NOTE — Telephone Encounter (Signed)
 Requested medications are due for refill today.  Unsure - both prescriptions are from 2024  Requested medications are on the active medications list.  yes  Last refill. Chantix  #60 2 rf, Polyethylene glycol 3350  g 1 rf  Future visit scheduled.   no  Notes to clinic.  Please review for refill.     Requested Prescriptions  Pending Prescriptions Disp Refills   varenicline  (CHANTIX ) 1 MG tablet [Pharmacy Med Name: VARENICLINE  1 MG TABLET] 60 tablet 2    Sig: TAKE 1 TABLET BY MOUTH TWICE A DAY     Psychiatry:  Drug Dependence Therapy - varenicline  Failed - 10/11/2023  3:38 PM      Failed - Manual Review: Do not refill starter pack. 1 mg tabs may be extended up to one year if the patient has quit smoking but still feels at risk for relapse.      Passed - Cr in normal range and within 180 days    Creat  Date Value Ref Range Status  04/13/2013 0.74 0.50 - 1.10 mg/dL Final   Creatinine, Ser  Date Value Ref Range Status  07/27/2023 0.77 0.57 - 1.00 mg/dL Final         Passed - Completed PHQ-2 or PHQ-9 in the last 360 days      Passed - Valid encounter within last 6 months    Recent Outpatient Visits           1 week ago Upper respiratory tract infection, unspecified type   [facility] [place] Delbert Clam, MD   2 months ago Prediabetes   [facility] [place]. [facility] Delbert Clam, MD   7 months ago Upper respiratory tract infection, unspecified type   [facility] Of [place]. [facility] Delbert Clam, MD   10 months ago Prediabetes   [facility] [place] [facility] [place]. [facility] Delbert Clam, MD   1 year ago Tobacco abuse   [facility] [place] [facility] [place]. [facility] [place], [place], MD               polyethylene glycol powder (GLYCOLAX /MIRALAX ) 17 GM/SCOOP  powder [Pharmacy Med Name: POLYETHYLENE GLYCOL 3350  POWD] 510 g 13    Sig: DISSOLVE 17 GRAMS INTO WATER AND DRINK BY MOUTH EVERY DAY     Gastroenterology:  Laxatives Passed - 10/11/2023  3:38 PM      Passed - Valid encounter within last 12 months    Recent Outpatient Visits           1 week ago Upper respiratory tract infection, unspecified type   [facility] [place]. [facility] Delbert Clam, MD   2 months ago Prediabetes   [facility] [place]. [facility] Delbert Clam, MD   7 months ago Upper respiratory tract infection, unspecified type   [facility] Of [place]. [facility] Delbert Clam, MD   10 months ago Prediabetes   [facility] [place] [facility] [place]. [facility] Delbert Clam, MD   1 year ago Tobacco abuse   [facility] [place] - [facility] Of . [facility] Delbert Clam, MD
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
## 2023-10-14 ENCOUNTER — Ambulatory Visit (HOSPITAL_BASED_OUTPATIENT_CLINIC_OR_DEPARTMENT_OTHER): Payer: Self-pay | Admitting: Physical Therapy

## 2023-10-17 ENCOUNTER — Ambulatory Visit (HOSPITAL_BASED_OUTPATIENT_CLINIC_OR_DEPARTMENT_OTHER): Payer: Self-pay | Admitting: Physical Therapy

## 2023-10-18 ENCOUNTER — Ambulatory Visit (HOSPITAL_BASED_OUTPATIENT_CLINIC_OR_DEPARTMENT_OTHER): Payer: Self-pay | Admitting: Physical Therapy

## 2023-10-18 ENCOUNTER — Encounter (HOSPITAL_BASED_OUTPATIENT_CLINIC_OR_DEPARTMENT_OTHER): Payer: Self-pay | Admitting: Physical Therapy

## 2023-10-18 DIAGNOSIS — M5459 Other low back pain: Secondary | ICD-10-CM

## 2023-10-18 DIAGNOSIS — M6281 Muscle weakness (generalized): Secondary | ICD-10-CM
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
## 2023-10-25 ENCOUNTER — Ambulatory Visit (HOSPITAL_BASED_OUTPATIENT_CLINIC_OR_DEPARTMENT_OTHER): Attending: Family Medicine | Admitting: Physical Therapy

## 2023-10-25 ENCOUNTER — Ambulatory Visit: Payer: Self-pay

## 2023-10-25 ENCOUNTER — Encounter (HOSPITAL_BASED_OUTPATIENT_CLINIC_OR_DEPARTMENT_OTHER): Payer: Self-pay | Admitting: Physical Therapy

## 2023-10-25 DIAGNOSIS — M6281 Muscle weakness (generalized): Secondary | ICD-10-CM | POA: Diagnosis present

## 2023-10-25 DIAGNOSIS — M5459 Other low back pain: Secondary | ICD-10-CM | POA: Diagnosis present

## 2023-10-25 DIAGNOSIS — J432 Centrilobular emphysema: Secondary | ICD-10-CM

## 2023-10-25 DIAGNOSIS — J9611 Chronic respiratory failure with hypoxia: Secondary | ICD-10-CM

## 2023-10-25 DIAGNOSIS — R1310 Dysphagia, unspecified: Secondary | ICD-10-CM | POA: Insufficient documentation

## 2023-10-25 NOTE — Telephone Encounter (Signed)
 FYI Only or Action Required?: Action required by provider: update on patient condition. Please reach out to patient regarding OCD titration versus continuous flow. Or assess at OV 10/16  Patient is followed in Pulmonology for 11/03/23, last seen on 07/18/2023 by Jude Harden GAILS, MD.  Called Nurse Triage reporting No chief complaint on file..  Symptoms began several days ago. Machine alert  Interventions attempted: Nothing.  Symptoms are: unchanged.  Triage Disposition: No disposition on file.  Patient/caregiver understands and will follow disposition?:   Received call from [place] at [facility] regarding patients Concentrator giving warning on not triggering the machine. Patient was adamant that she had continuous flow. [place] asking if we would give orders for OCD Titration or talk with the patient regarding transitioning to tanks for continuous flow.   Reached out to pt. Portable machine is what is giving issues. She is having to stop to take breaks walking from her car inside because it is Triggered flow. Patient states she is currently at her baseline. She wants to keep things the way they are but wishes it was continuous but insurance. She sounds mildly SOB after speaking awhile. Not coughing or gasping.   Reps= not getting the O2  Copied from CRM #[id number]. Topic: Clinical - Order For Equipment >> Oct 25, 2023  1:37 PM Nathanel DEL wrote: Reason for CRM: Tommye w/ [facility] calling o speak w/ Dr Jude nurse concerning her O2
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
## 2023-10-25 NOTE — Telephone Encounter (Signed)
 Spoke with Melissa at [facility] she states pt called them and tells them there is a warning on her POC because she is not triggering it as she is not taking breaths so no oxygen  is being delivered to her this is not a machine issue it is a pt Melissa states pt does not want tanks she only wants her POC even though she's not getting oxygen  from it. She actually needs continuous flow but is adamant she will not carry tanks around. They are requesting an order for a Oxygen  titration study for RT to go out and evaluate pt then maybe they can get pt to do tanks. Please advise on Order

## 2023-10-26 ENCOUNTER — Telehealth: Payer: Self-pay

## 2023-10-26 NOTE — Telephone Encounter (Signed)
 Order has been started for Nebulizer machine via parachute.       Copied from CRM [id number]. Topic: Clinical - Order For Equipment >> Oct 25, 2023  1:51 PM Avram MATSU wrote: Reason for CRM:  pt is calling because she needs an order put in for the nebulizer machine    ----------------------------------------------------------------------- From previous Reason for Contact - Prescription Issue: Reason for CRM: pt is calling because she needs an order put in for the nebulizer machine

## 2023-10-26 NOTE — Telephone Encounter (Signed)
 Order placed

## 2023-10-28 ENCOUNTER — Ambulatory Visit (HOSPITAL_BASED_OUTPATIENT_CLINIC_OR_DEPARTMENT_OTHER): Admitting: Physical Therapy

## 2023-10-28 ENCOUNTER — Encounter (HOSPITAL_BASED_OUTPATIENT_CLINIC_OR_DEPARTMENT_OTHER): Payer: Self-pay

## 2023-10-28 ENCOUNTER — Ambulatory Visit (HOSPITAL_BASED_OUTPATIENT_CLINIC_OR_DEPARTMENT_OTHER): Payer: Self-pay | Admitting: Physical Therapy

## 2023-11-01 ENCOUNTER — Encounter (HOSPITAL_BASED_OUTPATIENT_CLINIC_OR_DEPARTMENT_OTHER): Payer: Self-pay

## 2023-11-01 ENCOUNTER — Ambulatory Visit (HOSPITAL_BASED_OUTPATIENT_CLINIC_OR_DEPARTMENT_OTHER): Admitting: Physical Therapy

## 2023-11-03 ENCOUNTER — Ambulatory Visit: Admitting: Adult Health

## 2023-11-03 ENCOUNTER — Encounter: Payer: Self-pay | Admitting: Adult Health

## 2023-11-03 VITALS — BP 110/66 | HR 92 | Temp 97.6°F | Ht 63.0 in | Wt 181.8 lb

## 2023-11-03 DIAGNOSIS — Z720 Tobacco use: Secondary | ICD-10-CM

## 2023-11-03 DIAGNOSIS — J439 Emphysema, unspecified: Secondary | ICD-10-CM

## 2023-11-03 DIAGNOSIS — J9610 Chronic respiratory failure, unspecified whether with hypoxia or hypercapnia: Secondary | ICD-10-CM | POA: Diagnosis not present

## 2023-11-03 DIAGNOSIS — R918 Other nonspecific abnormal finding of lung field: Secondary | ICD-10-CM

## 2023-11-03 DIAGNOSIS — J309 Allergic rhinitis, unspecified: Secondary | ICD-10-CM

## 2023-11-03 DIAGNOSIS — J9611 Chronic respiratory failure with hypoxia: Secondary | ICD-10-CM

## 2023-11-03 NOTE — Progress Notes (Signed)
 @Patient  ID: Cheryl King, female    DOB: 11/25/1965, 58 y.o.   MRN: [id number]  Chief Complaint  Patient presents with   Follow-up   Shortness of Breath    Referring provider: Delbert Clam, MD  HPI: 58 yo female smoker followed for COPD and Chronic respiratory failure on O2     TEST/EVENTS :  Pulmonary rehab completed  Started on O2 2023  Participates in the LDCT screening  PFTs 02/2021 >> severe airway obstruction, ratio 52, FEV1 44%, FVC 67%, DLCO 33%   06/2022 LDCT chest RADS2 ,enlarged PA  07/2021 CT chest >> stable 5mm RLL nodule 08/2020 CT chest >> 5mm nodule RLL unchanged since 06/2019  Discussed the use of AI scribe software for clinical note transcription with the patient, who gave verbal consent to proceed.  History of Present Illness Cheryl King is a 58 year old female with COPD who presents for a four month checkup.  She has emphysema and COPD, chronic respiratory failure and lung nodules she participates in the lung cancer CT screening program.    Overall says her breathing is doing okay.  She gets short of breath with heavy activities.  Uses her oxygen  with ambulation and at bedtime.  She denies currently no flare in her cough or wheezing.    CT chest July 27, 2023 showed stable emphysema, stable scattered nodules and small new left lower lobe nodules maximum 3.6 mm considered a lung RADS 2.  We discussed her CT results in detail.    She uses the Trelegy inhaler once daily. . She is working on quitting smoking and is using Chantix  to aid in this effort.  We discussed smoking cessation in detail.  She also takes Zyrtec , Astelin  nasal spray, and Singulair  for allergies, managed by her family doctor.  Her ankles were swollen during the summer, possibly due to heat or salt intake, but they are not currently swollen. She uses compression socks to manage this issue.     Allergies  Allergen Reactions   Percocet [Oxycodone -Acetaminophen ]  Nausea Only   Vicodin [Hydrocodone -Acetaminophen ] Nausea Only   Penicillins Rash    Immunization History  Administered Date(s) Administered   Fluad Quad(high Dose 65+) 10/14/2020   Influenza,inj,Quad PF,6+ Mos 10/09/2013, 10/03/2015, 10/18/2017, 09/20/2018, 10/23/2019   Influenza-Unspecified 10/19/2016, 11/02/2021   Moderna Covid-19 Fall Seasonal Vaccine 76yrs & older 10/12/2023   PFIZER Comirnaty(Gray Top)Covid-19 Tri-Sucrose Vaccine 10/23/2020   PFIZER(Purple Top)SARS-COV-2 Vaccination 11/13/2020   PNEUMOCOCCAL CONJUGATE-20 10/23/2020   Tdap 08/22/2015   Zoster Recombinant(Shingrix) 12/01/2020, 02/02/2021    Past Medical History:  Diagnosis Date   Acid reflux    Allergy    Carpal tunnel syndrome    COPD (chronic obstructive pulmonary disease) (HCC)    Depression    Diverticulitis    Hyperlipidemia    Low back pain    Neck pain    Sacroiliac inflammation    Sciatica    Seasonal allergies     Tobacco History: Social History   Tobacco Use  Smoking Status Every Day   Current packs/day: 0.50   Average packs/day: 0.5 packs/day for 44.8 years (22.4 ttl pk-yrs)   Types: Cigarettes   Start date: 1981   Passive exposure: Past (uses patches, down to 3 cigarettes a day)  Smokeless Tobacco Never  Tobacco Comments   Pt states she smokes about 2 ciggs daily.   Ready to quit: Not Answered Counseling given: Not Answered Tobacco comments: Pt states she smokes about 2 ciggs daily.   Outpatient  Medications Prior to Visit  Medication Sig Dispense Refill   acetaminophen -codeine  (TYLENOL  #3) 300-30 MG tablet Take 1 tablet by mouth every 8 (eight) hours as needed for severe pain (pain score 7-10). 5 tablet 0   albuterol  (PROVENTIL ) (2.5 MG/3ML) 0.083% nebulizer solution Take 3 mLs (2.5 mg total) by nebulization every 6 (six) hours as needed for wheezing or shortness of breath. 150 mL 1   amitriptyline  (ELAVIL ) 50 MG tablet TAKE 1 TABLET BY MOUTH EVERYDAY AT BEDTIME 90 tablet 1    Azelastine  HCl 137 MCG/SPRAY SOLN Place 1 spray into both nostrils daily. 30 mL 6   cetirizine  (ZYRTEC ) 10 MG tablet Take 1 tablet (10 mg total) by mouth daily. 90 tablet 1   cyclobenzaprine  (FLEXERIL ) 10 MG tablet TAKE 1 TABLET BY MOUTH TWICE A DAY AS NEEDED FOR MUSCLE SPASMS 60 tablet 2   diclofenac  sodium (VOLTAREN ) 1 % GEL Apply 2 g topically 4 (four) times daily. 5 Tube 2   doxycycline  (VIBRA -TABS) 100 MG tablet Take 1 tablet (100 mg total) by mouth 2 (two) times daily. 14 tablet 0   DULoxetine  (CYMBALTA ) 60 MG capsule Take 1 capsule (60 mg total) by mouth daily. 90 capsule 1   esomeprazole  (NEXIUM ) 40 MG capsule TAKE 1 CAPSULE (40 MG TOTAL) BY MOUTH DAILY. 90 capsule 1   EYSUVIS 0.25 % SUSP Apply 1 drop to eye 4 (four) times daily.     ezetimibe  (ZETIA ) 10 MG tablet TAKE 1 TABLET BY MOUTH DAILY AFTER SUPPER (Patient not taking: Reported on 08/08/2023) 90 tablet 1   fluticasone  (FLONASE ) 50 MCG/ACT nasal spray INSTILL 1 SPRAY INTO BOTH NOSTRILS DAILY 48 mL 0   Fluticasone -Umeclidin-Vilant (TRELEGY ELLIPTA ) 100-62.5-25 MCG/ACT AEPB Inhale 28 each into the lungs daily. 28 each 0   Lactobacillus CAPS Take 1 capsule by mouth daily.     lidocaine  (LIDODERM ) 5 % Place 1 patch onto the skin daily. Remove & Discard patch within 12 hours or as directed by MD 30 patch 6   miconazole  (MICATIN) 2 % cream Apply 1 application topically 2 (two) times daily. 28.35 g 0   Misc. Devices MISC 1. Rolling walker with seat 2. Shower chair 3. Elevated toilet seat.   Diagnosis-chronic back pain 1 each 0   Misc. Devices MISC Compression stocking.  Diagnosis pedal edema Fax to [phone] [facility] 2 each 0   Misc. Devices MISC Nebulizer device. Diagnosis - Emphysema 1 each 0   montelukast  (SINGULAIR ) 10 MG tablet TAKE 1 TABLET BY MOUTH EVERY DAY 90 tablet 0   Multiple Vitamin (MULTIVITAMIN WITH MINERALS) TABS tablet Take 1 tablet by mouth daily.     nicotine  (NICODERM CQ  - DOSED IN MG/24 HOURS) 21  mg/24hr patch PLACE 1 PATCH (21 MG TOTAL) ONTO THE SKIN DAILY. THEN DECREASE TO 14MG /24 HR 28 patch 2   oxybutynin  (DITROPAN ) 5 MG tablet TAKE 1 TABLET BY MOUTH TWICE A DAY 180 tablet 1   polyethylene glycol powder (GLYCOLAX /MIRALAX ) 17 GM/SCOOP powder DISSOLVE 17 GRAMS INTO WATER AND DRINK BY MOUTH EVERY DAY 510 g 2   predniSONE  (DELTASONE ) 10 MG tablet Take 4 tabs daily with food x 4 days, then 3 tabs daily x 4 days, then 2 tabs daily x 4 days, then 1 tab daily x4 days then stop. 40 tablet 0   pregabalin  (LYRICA ) 75 MG capsule TAKE 1 CAPSULE BY MOUTH THREE TIMES A DAY 90 capsule 1   RESTASIS  0.05 % ophthalmic emulsion Place 1 drop into both  eyes daily. 0.4 mL 3   rosuvastatin  (CRESTOR ) 10 MG tablet Take 1 tablet (10 mg total) by mouth daily. 90 tablet 1   TRELEGY ELLIPTA  100-62.5-25 MCG/ACT AEPB TAKE 1 PUFF BY MOUTH EVERY DAY 60 each 5   TRELEGY ELLIPTA  100-62.5-25 MCG/ACT AEPB INHALE 1 PUFF BY MOUTH EVERY DAY 60 each 5   triamcinolone  cream (KENALOG ) 0.1 % Apply 1 application topically 2 (two) times daily. 45 g 1   varenicline  (CHANTIX ) 1 MG tablet TAKE 1 TABLET BY MOUTH TWICE A DAY 60 tablet 2   VENTOLIN  HFA 108 (90 Base) MCG/ACT inhaler TAKE 2 PUFFS BY MOUTH EVERY 6 HOURS AS NEEDED FOR WHEEZE OR SHORTNESS OF BREATH 18 each 5   No facility-administered medications prior to visit.     Review of Systems:   Constitutional:   No  weight loss, night sweats,  Fevers, chills, +atigue, or  lassitude.  HEENT:   No headaches,  Difficulty swallowing,  Tooth/dental problems, or  Sore throat,                No sneezing, itching, ear ache, nasal congestion, post nasal drip,   CV:  No chest pain,  Orthopnea, PND, swelling in lower extremities, anasarca, dizziness, palpitations, syncope.   GI  No heartburn, indigestion, abdominal pain, nausea, vomiting, diarrhea, change in bowel habits, loss of appetite, bloody stools.   Resp:   No chest wall deformity  Skin: no rash or lesions.  GU: no dysuria,  change in color of urine, no urgency or frequency.  No flank pain, no hematuria   MS:  No joint pain or swelling.  No decreased range of motion.  No back pain.    Physical Exam  BP 110/66   Pulse 92   Temp 97.6 F (36.4 C) (Temporal)   Ht 5' 3 (1.6 m)   Wt 181 lb 12.8 oz (82.5 kg)   SpO2 93%   BMI 32.20 kg/m   GEN: A/Ox3; pleasant , NAD, well nourished, on oxygen    HEENT:  [place]/AT,  EACs-clear, TMs-wnl, NOSE-clear, THROAT-clear, no lesions, no postnasal drip or exudate noted.   NECK:  Supple w/ fair ROM; no JVD; normal carotid impulses w/o bruits; no thyromegaly or nodules palpated; no lymphadenopathy.    RESP  Clear  P & A; w/o, wheezes/ rales/ or rhonchi. no accessory muscle use, no dullness to percussion  CARD:  RRR, no m/r/g, trace peripheral edema, pulses intact, no cyanosis or clubbing.  GI:   Soft & nt; nml bowel sounds; no organomegaly or masses detected.   Musco: Warm bil, no deformities or joint swelling noted.   Neuro: alert, no focal deficits noted.    Skin: Warm, no lesions or rashes    Lab Results:  CBC    Component Value Date/Time   WBC 9.4 07/27/2023 1539   WBC 8.9 12/28/2021 1838   RBC 4.22 07/27/2023 1539   RBC 4.43 12/28/2021 1838   HGB 12.7 07/27/2023 1539   HCT 39.1 07/27/2023 1539   PLT 254 07/27/2023 1539   MCV 93 07/27/2023 1539   MCH 30.1 07/27/2023 1539   MCH 31.4 12/28/2021 1838   MCHC 32.5 07/27/2023 1539   MCHC 33.3 12/28/2021 1838   RDW 14.7 07/27/2023 1539   LYMPHSABS 2.9 07/27/2023 1539   MONOABS 1.2 (H) 12/28/2021 1838   EOSABS 0.2 07/27/2023 1539   BASOSABS 0.1 07/27/2023 1539    BMET    Component Value Date/Time   NA 142 07/27/2023 1539  K 4.5 07/27/2023 1539   CL 104 07/27/2023 1539   CO2 25 07/27/2023 1539   GLUCOSE 80 07/27/2023 1539   GLUCOSE 126 (H) 12/28/2021 1838   BUN 11 07/27/2023 1539   CREATININE 0.77 07/27/2023 1539   CREATININE 0.74 04/13/2013 1152   CALCIUM  9.7 07/27/2023 1539   GFRNONAA >60  12/28/2021 1838   GFRNONAA >89 04/13/2013 1152   GFRAA 97 01/28/2020 1012   GFRAA >89 04/13/2013 1152    BNP    Component Value Date/Time   BNP 30.0 06/01/2021 0953    ProBNP No results found for: PROBNP  Imaging: No results found.  Administration History     None          Latest Ref Rng & Units 09/29/2020    9:53 AM  PFT Results  FVC-Pre L 1.93   FVC-Predicted Pre % 67   FVC-Post L 2.10   FVC-Predicted Post % 73   Pre FEV1/FVC % % 52   Post FEV1/FCV % % 54   FEV1-Pre L 1.01   FEV1-Predicted Pre % 44   FEV1-Post L 1.14   DLCO uncorrected ml/min/mmHg 7.02   DLCO UNC% % 33   DLCO corrected ml/min/mmHg 7.02   DLCO COR %Predicted % 33   DLVA Predicted % 40   TLC L 5.32   TLC % Predicted % 102   RV % Predicted % 155     No results found for: NITRICOXIDE      Assessment & Plan:   Assessment and Plan Assessment & Plan Chronic obstructive pulmonary disease (COPD) with emphysema  -appears stable She continues using the Trelegy inhaler daily and the albuterol  nebulizer as needed, particularly during weather changes.   Chronic respiratory failure appears stable and compensated on oxygen   Current baseline oxygen  therapy is maintained at 4 L/min during sleep and as needed during rest.  To maintain O2 saturations greater than 88 to 9%  Pulmonary nodules under surveillance  -appears stable on most recent imaging Pulmonary nodules remain stable with no changes on recent lung cancer screening, and no suspicious areas identified. Annual surveillance will continue to monitor for any changes.  Tobacco use disorder   She has an ongoing tobacco use disorder and is actively working towards cessation with a goal to quit by the new year. Currently, she is using Chantix  to aid in smoking cessation and is determined to quit smoking to avoid the burden of oxygen  therapy. Continued efforts towards smoking cessation are encouraged.  Continue with lung cancer CT screening  yearly.  Allergic rhinitis continue on current regimen appears stable.  No changes in maintenance regimen with Zyrtec , Astelin , Flonase  as needed    Madelin Stank, NP 11/03/2023

## 2023-11-03 NOTE — Patient Instructions (Addendum)
 Continue on Trelegy 1 puff daily, rinse after use.  Albuterol  inhaler or neb As needed    Activity as tolerated  Wear Oxygen  4lm with activity and At bedtime   Work on not smoking .  Continue with yearly Lung cancer CT chest screening CT  Follow up with Dr. Jude or Kassidee Narciso NP  in 4 months and As needed

## 2023-11-04 ENCOUNTER — Encounter (HOSPITAL_BASED_OUTPATIENT_CLINIC_OR_DEPARTMENT_OTHER): Payer: Self-pay | Admitting: Physical Therapy

## 2023-11-04 ENCOUNTER — Ambulatory Visit (HOSPITAL_BASED_OUTPATIENT_CLINIC_OR_DEPARTMENT_OTHER): Admitting: Physical Therapy

## 2023-11-04 DIAGNOSIS — M5459 Other low back pain: Secondary | ICD-10-CM | POA: Diagnosis not present

## 2023-11-04 DIAGNOSIS — M6281 Muscle weakness (generalized): Secondary | ICD-10-CM
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
## 2023-11-07 ENCOUNTER — Other Ambulatory Visit: Payer: Self-pay

## 2023-11-07 ENCOUNTER — Ambulatory Visit: Admitting: Physical Medicine and Rehabilitation

## 2023-11-07 DIAGNOSIS — M5416 Radiculopathy, lumbar region: Secondary | ICD-10-CM | POA: Diagnosis not present

## 2023-11-07 MED ORDER — METHYLPREDNISOLONE ACETATE 40 MG/ML IJ SUSP
40.0000 mg | Freq: Once | INTRAMUSCULAR | Status: AC
  Administered 2023-11-07: 40 mg

## 2023-11-07 NOTE — Progress Notes (Signed)
    +  Driver, -BT, -Dye Allergies.

## 2023-11-07 NOTE — Procedures (Signed)
 Lumbosacral Transforaminal Epidural Steroid Injection - Sub-Pedicular Approach with Fluoroscopic Guidance  Patient: Cheryl King      Date of Birth: 1965-11-27 MRN: [id number] PCP: Delbert Clam, MD      Visit Date: 11/07/2023   Universal Protocol:    Date/Time: 11/07/2023  Consent Given By: the patient  Position: PRONE  Additional Comments: Vital signs were monitored before and after the procedure. Patient was prepped and draped in the usual sterile fashion. The correct patient, procedure, and site was verified.   Injection Procedure Details:   Procedure diagnoses: Lumbar radiculopathy [M54.16]    Meds Administered:  Meds ordered this encounter  Medications   methylPREDNISolone  acetate (DEPO-MEDROL ) injection 40 mg    Laterality: Bilateral  Location/Site: L5  Needle:5.0 in., 22 ga.  Short bevel or Quincke spinal needle  Needle Placement: Transforaminal  Findings:    -Comments: Excellent flow of contrast along the nerve, nerve root and into the epidural space.  Procedure Details: After squaring off the end-plates to get a true AP view, the C-arm was positioned so that an oblique view of the foramen as noted above was visualized. The target area is just inferior to the nose of the scotty dog or sub pedicular. The soft tissues overlying this structure were infiltrated with 2-3 ml. of 1% Lidocaine  without Epinephrine.  The spinal needle was inserted toward the target using a trajectory view along the fluoroscope beam.  Under AP and lateral visualization, the needle was advanced so it did not puncture dura and was located close the 6 O'Clock position of the pedical in AP tracterory. Biplanar projections were used to confirm position. Aspiration was confirmed to be negative for CSF and/or blood. A 1-2 ml. volume of Isovue-250 was injected and flow of contrast was noted at each level. Radiographs were obtained for documentation purposes.   After attaining the  desired flow of contrast documented above, a 0.5 to 1.0 ml test dose of 0.25% Marcaine  was injected into each respective transforaminal space.  The patient was observed for 90 seconds post injection.  After no sensory deficits were reported, and normal lower extremity motor function was noted,   the above injectate was administered so that equal amounts of the injectate were placed at each foramen (level) into the transforaminal epidural space.   Additional Comments:  The patient tolerated the procedure well Dressing: 2 x 2 sterile gauze and Band-Aid    Post-procedure details: Patient was observed during the procedure. Post-procedure instructions were reviewed.  Patient left the clinic in stable condition.

## 2023-11-07 NOTE — Progress Notes (Signed)
 Cheryl King - 58 y.o. female MRN [id number]  Date of birth: 1965/04/16  Office Visit Note: Visit Date: 11/07/2023 PCP: Delbert Clam, MD Referred by: Delbert Clam, MD  Subjective: Chief Complaint  Patient presents with   Lower Back - Pain   HPI:  Cheryl King is a 58 y.o. female who comes in today at the request of Duwaine Pouch, FNP for planned Bilateral L5-S1 Lumbar Transforaminal epidural steroid injection with fluoroscopic guidance.  The patient has failed conservative care including home exercise, medications, time and activity modification.  This injection will be diagnostic and hopefully therapeutic.  Please see requesting physician notes for further details and justification.   ROS Otherwise per HPI.  Assessment & Plan: Visit Diagnoses:    ICD-10-CM   1. Lumbar radiculopathy  M54.16 XR C-ARM NO REPORT    Epidural Steroid injection    methylPREDNISolone  acetate (DEPO-MEDROL ) injection 40 mg      Plan: No additional findings.   Meds & Orders:  Meds ordered this encounter  Medications   methylPREDNISolone  acetate (DEPO-MEDROL ) injection 40 mg    Orders Placed This Encounter  Procedures   XR C-ARM NO REPORT   Epidural Steroid injection    Follow-up: Return for visit to requesting provider as needed.   Procedures: No procedures performed  Lumbosacral Transforaminal Epidural Steroid Injection - Sub-Pedicular Approach with Fluoroscopic Guidance  Patient: Cheryl King      Date of Birth: Nov 21, 1965 MRN: [id number] PCP: Delbert Clam, MD      Visit Date: 11/07/2023   Universal Protocol:    Date/Time: 11/07/2023  Consent Given By: the patient  Position: PRONE  Additional Comments: Vital signs were monitored before and after the procedure. Patient was prepped and draped in the usual sterile fashion. The correct patient, procedure, and site was verified.   Injection Procedure Details:   Procedure diagnoses: Lumbar radiculopathy  [M54.16]    Meds Administered:  Meds ordered this encounter  Medications   methylPREDNISolone  acetate (DEPO-MEDROL ) injection 40 mg    Laterality: Bilateral  Location/Site: L5  Needle:5.0 in., 22 ga.  Short bevel or Quincke spinal needle  Needle Placement: Transforaminal  Findings:    -Comments: Excellent flow of contrast along the nerve, nerve root and into the epidural space.  Procedure Details: After squaring off the end-plates to get a true AP view, the C-arm was positioned so that an oblique view of the foramen as noted above was visualized. The target area is just inferior to the nose of the scotty dog or sub pedicular. The soft tissues overlying this structure were infiltrated with 2-3 ml. of 1% Lidocaine  without Epinephrine.  The spinal needle was inserted toward the target using a trajectory view along the fluoroscope beam.  Under AP and lateral visualization, the needle was advanced so it did not puncture dura and was located close the 6 O'Clock position of the pedical in AP tracterory. Biplanar projections were used to confirm position. Aspiration was confirmed to be negative for CSF and/or blood. A 1-2 ml. volume of Isovue-250 was injected and flow of contrast was noted at each level. Radiographs were obtained for documentation purposes.   After attaining the desired flow of contrast documented above, a 0.5 to 1.0 ml test dose of 0.25% Marcaine  was injected into each respective transforaminal space.  The patient was observed for 90 seconds post injection.  After no sensory deficits were reported, and normal lower extremity motor function was noted,   the above injectate was administered so that equal amounts  of the injectate were placed at each foramen (level) into the transforaminal epidural space.   Additional Comments:  The patient tolerated the procedure well Dressing: 2 x 2 sterile gauze and Band-Aid    Post-procedure details: Patient was observed during the  procedure. Post-procedure instructions were reviewed.  Patient left the clinic in stable condition.    Clinical History: EXAM: MRI LUMBAR SPINE WITHOUT AND WITH CONTRAST   TECHNIQUE: Multiplanar and multiecho pulse sequences of the lumbar spine were obtained without and with intravenous contrast.   CONTRAST:  15mL MULTIHANCE  GADOBENATE DIMEGLUMINE  529 MG/ML IV SOLN   COMPARISON:  01/05/2017   FINDINGS: Segmentation:  5 lumbar type vertebrae   Alignment:  Mild scoliosis.  No listhesis   Vertebrae: No fracture, evidence of discitis, or bone lesion. Endplate sclerosis has developed at L2-3 and L5-S1, degenerative appearing.   Conus medullaris and cauda equina: Conus extends to the L1 level. Conus and cauda equina appear normal.   Paraspinal and other soft tissues: No perispinal mass or inflammation noted.   Disc levels:   T11-12: Chronic disc collapse with ventral protrusion.   T12- L1: Unremarkable.   L1-L2: Disc narrowing and bulging with endplate degeneration. Patent canal and foramina   L2-L3: Disc narrowing and bulging with endplate degeneration. Circumferential disc bulging and facet spurring. Mild or moderate left foraminal narrowing   L3-L4: Disc narrowing and bulging with facet spurring. Patent canal and foramina   L4-L5: Disc narrowing and bulging. Degenerative facet spurring greater on the right. Mild-to-moderate right foraminal narrowing   L5-S1:Disc collapse and endplate degeneration with bulge. Degenerative facet spurring on the right more than left. Biforaminal impingement, worse on the right and progressed.   IMPRESSION: 1. Generalized lumbar spine degeneration with progression from 2018, especially at L5-S1 where there is right more than left foraminal impingement. 2. Diffusely patent spinal canal. 3. Mild scoliosis.     Electronically Signed   By: Dorn Roulette M.D.   On: 09/30/2020 09:57     Objective:  VS:  HT:    WT:   BMI:      BP:   HR: bpm  TEMP: ( )  RESP:  Physical Exam Vitals and nursing note reviewed.  Constitutional:      General: She is not in acute distress.    Appearance: Normal appearance. She is not ill-appearing.  HENT:     Head: Normocephalic and atraumatic.     Right Ear: External ear normal.     Left Ear: External ear normal.  Eyes:     Extraocular Movements: Extraocular movements intact.  Cardiovascular:     Rate and Rhythm: Normal rate.     Pulses: Normal pulses.  Pulmonary:     Effort: Pulmonary effort is normal. No respiratory distress.  Abdominal:     General: There is no distension.     Palpations: Abdomen is soft.  Musculoskeletal:        General: Tenderness present.     Cervical back: Neck supple.     Right lower leg: No edema.     Left lower leg: No edema.     Comments: Patient has good distal strength with no pain over the greater trochanters.  No clonus or focal weakness.  Skin:    Findings: No erythema, lesion or rash.  Neurological:     General: No focal deficit present.     Mental Status: She is alert and oriented to person, place, and time.     Sensory: No sensory deficit.  Motor: No weakness or abnormal muscle tone.     Coordination: Coordination normal.  Psychiatric:        Mood and Affect: Mood normal.        Behavior: Behavior normal.      Imaging: No results found.

## 2023-11-08 ENCOUNTER — Ambulatory Visit (HOSPITAL_BASED_OUTPATIENT_CLINIC_OR_DEPARTMENT_OTHER): Admitting: Physical Therapy

## 2023-11-11 ENCOUNTER — Encounter (HOSPITAL_BASED_OUTPATIENT_CLINIC_OR_DEPARTMENT_OTHER): Payer: Self-pay | Admitting: Physical Therapy

## 2023-11-11 ENCOUNTER — Ambulatory Visit (HOSPITAL_BASED_OUTPATIENT_CLINIC_OR_DEPARTMENT_OTHER): Admitting: Physical Therapy

## 2023-11-11 DIAGNOSIS — M5459 Other low back pain: Secondary | ICD-10-CM | POA: Diagnosis not present

## 2023-11-11 DIAGNOSIS — R1310 Dysphagia, unspecified: Secondary | ICD-10-CM

## 2023-11-11 DIAGNOSIS — M6281 Muscle weakness (generalized): Secondary | ICD-10-CM
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
## 2023-11-15 ENCOUNTER — Ambulatory Visit (HOSPITAL_BASED_OUTPATIENT_CLINIC_OR_DEPARTMENT_OTHER): Admitting: Physical Therapy

## 2023-11-15 ENCOUNTER — Encounter (HOSPITAL_BASED_OUTPATIENT_CLINIC_OR_DEPARTMENT_OTHER): Payer: Self-pay

## 2023-11-15 ENCOUNTER — Encounter (HOSPITAL_BASED_OUTPATIENT_CLINIC_OR_DEPARTMENT_OTHER): Payer: Self-pay | Admitting: Physical Therapy

## 2023-11-17 ENCOUNTER — Encounter (HOSPITAL_BASED_OUTPATIENT_CLINIC_OR_DEPARTMENT_OTHER): Payer: Self-pay

## 2023-11-17 ENCOUNTER — Emergency Department (HOSPITAL_BASED_OUTPATIENT_CLINIC_OR_DEPARTMENT_OTHER)

## 2023-11-17 ENCOUNTER — Other Ambulatory Visit: Payer: Self-pay

## 2023-11-17 ENCOUNTER — Emergency Department (HOSPITAL_BASED_OUTPATIENT_CLINIC_OR_DEPARTMENT_OTHER)
Admission: EM | Admit: 2023-11-17 | Discharge: 2023-11-17 | Disposition: A | Discharge status: Home / Self Care | Attending: Emergency Medicine | Admitting: Emergency Medicine

## 2023-11-17 DIAGNOSIS — J449 Chronic obstructive pulmonary disease, unspecified: Secondary | ICD-10-CM | POA: Insufficient documentation

## 2023-11-17 DIAGNOSIS — B9689 Other specified bacterial agents as the cause of diseases classified elsewhere: Secondary | ICD-10-CM | POA: Insufficient documentation

## 2023-11-17 DIAGNOSIS — N760 Acute vaginitis: Secondary | ICD-10-CM | POA: Diagnosis not present

## 2023-11-17 DIAGNOSIS — A599 Trichomoniasis, unspecified: Secondary | ICD-10-CM | POA: Diagnosis not present

## 2023-11-17 DIAGNOSIS — R1030 Lower abdominal pain, unspecified: Secondary | ICD-10-CM | POA: Diagnosis present

## 2023-11-17 LAB — URINALYSIS, ROUTINE W REFLEX MICROSCOPIC
Bilirubin Urine: NEGATIVE
Glucose, UA: NEGATIVE mg/dL
Hgb urine dipstick: NEGATIVE
Ketones, ur: NEGATIVE mg/dL
Nitrite: NEGATIVE
Protein, ur: NEGATIVE mg/dL
Specific Gravity, Urine: 1.021 (ref 1.005–1.030)
WBC, UA: >50 WBC/hpf (ref 0–5)
pH: 7 (ref 5.0–8.0)

## 2023-11-17 LAB — LIPASE, BLOOD: Lipase: 26 U/L (ref 11–51)

## 2023-11-17 LAB — COMPREHENSIVE METABOLIC PANEL WITH GFR
ALT: 13 U/L (ref 0–44)
AST: 19 U/L (ref 15–41)
Albumin: 4 g/dL (ref 3.5–5.0)
Alkaline Phosphatase: 140 U/L — ABNORMAL HIGH (ref 38–126)
Anion gap: 9 (ref 5–15)
BUN: 19 mg/dL (ref 6–20)
CO2: 28 mmol/L (ref 22–32)
Calcium: 9.7 mg/dL (ref 8.9–10.3)
Chloride: 103 mmol/L (ref 98–111)
Creatinine, Ser: 0.73 mg/dL (ref 0.44–1.00)
GFR, Estimated: >60 mL/min (ref >60)
Glucose, Bld: 97 mg/dL (ref 70–99)
Potassium: 3.9 mmol/L (ref 3.5–5.1)
Sodium: 140 mmol/L (ref 135–145)
Total Bilirubin: 0.3 mg/dL (ref 0.0–1.2)
Total Protein: 7.5 g/dL (ref 6.5–8.1)

## 2023-11-17 LAB — CBC
HCT: 38 % (ref 36.0–46.0)
Hemoglobin: 12.4 g/dL (ref 12.0–15.0)
MCH: 30.3 pg (ref 26.0–34.0)
MCHC: 32.6 g/dL (ref 30.0–36.0)
MCV: 92.9 fL (ref 80.0–100.0)
Platelets: 236 K/uL (ref 150–400)
RBC: 4.09 MIL/uL (ref 3.87–5.11)
RDW: 16 % — ABNORMAL HIGH (ref 11.5–15.5)
WBC: 8.3 K/uL (ref 4.0–10.5)
nRBC: 0 % (ref 0.0–0.2)

## 2023-11-17 LAB — WET PREP, GENITAL
Sperm: NONE SEEN
WBC, Wet Prep HPF POC: >=10 — AB (ref <10)
Yeast Wet Prep HPF POC: NONE SEEN

## 2023-11-17 MED ORDER — METRONIDAZOLE 500 MG PO TABS
500.0000 mg | ORAL_TABLET | Freq: Once | ORAL | Status: AC
  Administered 2023-11-17: 500 mg via ORAL
  Filled 2023-11-17: qty 1

## 2023-11-17 MED ORDER — ONDANSETRON HCL 4 MG/2ML IJ SOLN
4.0000 mg | Freq: Once | INTRAMUSCULAR | Status: AC
  Administered 2023-11-17: 4 mg via INTRAVENOUS
  Filled 2023-11-17: qty 2

## 2023-11-17 MED ORDER — MORPHINE SULFATE (PF) 2 MG/ML IV SOLN
2.0000 mg | Freq: Once | INTRAVENOUS | Status: AC
  Administered 2023-11-17: 2 mg via INTRAVENOUS
  Filled 2023-11-17: qty 1

## 2023-11-17 MED ORDER — METRONIDAZOLE 500 MG PO TABS: 500.0000 mg | ORAL_TABLET | Freq: Two times a day (BID) | ORAL | 0 refills | Status: AC

## 2023-11-17 MED ORDER — IOHEXOL 300 MG/ML  SOLN
100.0000 mL | Freq: Once | INTRAMUSCULAR | Status: AC | PRN
  Administered 2023-11-17: 100 mL via INTRAVENOUS

## 2023-11-17 NOTE — Discharge Instructions (Addendum)
 Today you were seen for trichomonas and bacterial vaginosis.  Please pick up your antibiotic and take as prescribed.  Please refrain from sexual intercourse until you have a test of cure.  Thank you for letting us  treat you today. After reviewing your labs and imaging, I feel you are safe to go home. Please follow up with your PCP in the next several days and provide them with your records from this visit. Return to the Emergency Room if pain becomes severe or symptoms worsen.

## 2023-11-17 NOTE — ED Notes (Signed)
 Patient transported to CT

## 2023-11-17 NOTE — ED Provider Notes (Signed)
 [place] EMERGENCY DEPARTMENT AT [facility] Provider Note   CSN: [id number] Arrival date & time: 11/17/23  1357     Patient presents with: Abdominal Pain   Cheryl King is a 58 y.o. female past medical history significant for COPD and diverticulitis presents today for lower abdominal pain for 2 weeks.  Patient also reporting vaginal discharge.  Patient denies dysuria, hematuria, possibility of STD, diarrhea, nausea, vomiting, fever, chills, chest pain, shortness of breath, or flank pain.    Abdominal Pain Associated symptoms: vaginal discharge        Prior to Admission medications   Medication Sig Start Date End Date Taking? Authorizing Provider  metroNIDAZOLE  (FLAGYL ) 500 MG tablet Take 1 tablet (500 mg total) by mouth 2 (two) times daily. 11/17/23  Yes Aliyanna Wassmer N, PA-C  acetaminophen -codeine  (TYLENOL  #3) 300-30 MG tablet Take 1 tablet by mouth every 8 (eight) hours as needed for severe pain (pain score 7-10). 09/14/23   Silver Wonda LABOR, PA  albuterol  (PROVENTIL ) (2.5 MG/3ML) 0.083% nebulizer solution Take 3 mLs (2.5 mg total) by nebulization every 6 (six) hours as needed for wheezing or shortness of breath. 09/28/23   Newlin, Enobong, MD  amitriptyline  (ELAVIL ) 50 MG tablet TAKE 1 TABLET BY MOUTH EVERYDAY AT BEDTIME 08/15/23   Newlin, Enobong, MD  Azelastine  HCl 137 MCG/SPRAY SOLN Place 1 spray into both nostrils daily. 06/03/22   Newlin, Enobong, MD  cetirizine  (ZYRTEC ) 10 MG tablet Take 1 tablet (10 mg total) by mouth daily. 04/18/17   Newlin, Enobong, MD  cyclobenzaprine  (FLEXERIL ) 10 MG tablet TAKE 1 TABLET BY MOUTH TWICE A DAY AS NEEDED FOR MUSCLE SPASMS 09/05/23   Delbert Clam, MD  diclofenac  sodium (VOLTAREN ) 1 % GEL Apply 2 g topically 4 (four) times daily. 03/18/17   Lucilla Lynwood BRAVO, MD  doxycycline  (VIBRA -TABS) 100 MG tablet Take 1 tablet (100 mg total) by mouth 2 (two) times daily. 08/12/23   Newlin, Enobong, MD  DULoxetine  (CYMBALTA ) 60 MG capsule Take 1  capsule (60 mg total) by mouth daily. 07/27/23   Newlin, Enobong, MD  esomeprazole  (NEXIUM ) 40 MG capsule TAKE 1 CAPSULE (40 MG TOTAL) BY MOUTH DAILY. 08/15/23   Newlin, Enobong, MD  EYSUVIS 0.25 % SUSP Apply 1 drop to eye 4 (four) times daily. 06/02/21   [provider]  ezetimibe  (ZETIA ) 10 MG tablet TAKE 1 TABLET BY MOUTH DAILY AFTER SUPPER Patient not taking: Reported on 08/08/2023 03/09/22   Ladona Heinz, MD  fluticasone  (FLONASE ) 50 MCG/ACT nasal spray INSTILL 1 SPRAY INTO BOTH NOSTRILS DAILY 09/14/22   Newlin, Enobong, MD  Fluticasone -Umeclidin-Vilant (TRELEGY ELLIPTA ) 100-62.5-25 MCG/ACT AEPB Inhale 28 each into the lungs daily. 10/21/22   Jude Harden GAILS, MD  Lactobacillus CAPS Take 1 capsule by mouth daily.    [provider]  miconazole  (MICATIN) 2 % cream Apply 1 application topically 2 (two) times daily. 02/18/21   Richad Jon HERO, NP  Misc. Devices MISC 1. Rolling walker with seat 2. Shower chair 3. Elevated toilet seat.   Diagnosis-chronic back pain 07/06/21   Delbert Clam, MD  Misc. Devices MISC Compression stocking.  Diagnosis pedal edema Fax to [phone] [facility] 06/09/22   Delbert Clam, MD  Misc. Devices MISC Nebulizer device. Diagnosis - Emphysema 09/28/23   Delbert Clam, MD  montelukast  (SINGULAIR ) 10 MG tablet TAKE 1 TABLET BY MOUTH EVERY DAY 09/12/23   Newlin, Enobong, MD  Multiple Vitamin (MULTIVITAMIN WITH MINERALS) TABS tablet Take 1 tablet by mouth daily.  [provider]  nicotine  (NICODERM CQ  - DOSED IN MG/24 HOURS) 21 mg/24hr patch PLACE 1 PATCH (21 MG TOTAL) ONTO THE SKIN DAILY. THEN DECREASE TO 14MG /24 HR 07/08/23   Newlin, Corrina, MD  oxybutynin  (DITROPAN ) 5 MG tablet TAKE 1 TABLET BY MOUTH TWICE A DAY 07/08/23   Newlin, Enobong, MD  polyethylene glycol powder (GLYCOLAX /MIRALAX ) 17 GM/SCOOP powder DISSOLVE 17 GRAMS INTO WATER AND DRINK BY MOUTH EVERY DAY 10/11/23   Newlin, Enobong, MD  predniSONE  (DELTASONE ) 10 MG tablet Take 4  tabs daily with food x 4 days, then 3 tabs daily x 4 days, then 2 tabs daily x 4 days, then 1 tab daily x4 days then stop. 07/18/23   Jude Harden GAILS, MD  pregabalin  (LYRICA ) 75 MG capsule TAKE 1 CAPSULE BY MOUTH THREE TIMES A DAY 10/05/23   Newlin, Enobong, MD  RESTASIS  0.05 % ophthalmic emulsion Place 1 drop into both eyes daily. 08/18/22   Newlin, Enobong, MD  rosuvastatin  (CRESTOR ) 10 MG tablet Take 1 tablet (10 mg total) by mouth daily. 07/28/23   Newlin, Enobong, MD  TRELEGY ELLIPTA  100-62.5-25 MCG/ACT AEPB TAKE 1 PUFF BY MOUTH EVERY DAY 01/03/23   Jude Harden GAILS, MD  TRELEGY ELLIPTA  100-62.5-25 MCG/ACT AEPB INHALE 1 PUFF BY MOUTH EVERY DAY 07/21/23   Alva, Rakesh V, MD  triamcinolone  cream (KENALOG ) 0.1 % Apply 1 application topically 2 (two) times daily. 01/03/19   Newlin, Enobong, MD  varenicline  (CHANTIX ) 1 MG tablet TAKE 1 TABLET BY MOUTH TWICE A DAY 10/11/23   Newlin, Enobong, MD  VENTOLIN  HFA 108 (90 Base) MCG/ACT inhaler TAKE 2 PUFFS BY MOUTH EVERY 6 HOURS AS NEEDED FOR WHEEZE OR SHORTNESS OF BREATH 10/15/22   Jude Harden GAILS, MD    Allergies: Percocet [oxycodone -acetaminophen ], Vicodin [hydrocodone -acetaminophen ], and Penicillins    Review of Systems  Gastrointestinal:  Positive for abdominal pain.  Genitourinary:  Positive for vaginal discharge.    Updated Vital Signs BP 119/76   Pulse 75   Temp 98.2 F (36.8 C) (Oral)   Resp 15   SpO2 98%   Physical Exam Vitals and nursing note reviewed.  Constitutional:      General: She is not in acute distress.    Appearance: She is well-developed. She is not toxic-appearing.  HENT:     Head: Normocephalic and atraumatic.  Eyes:     Extraocular Movements: Extraocular movements intact.     Conjunctiva/sclera: Conjunctivae normal.  Cardiovascular:     Rate and Rhythm: Normal rate and regular rhythm.     Heart sounds: No murmur heard. Pulmonary:     Effort: Pulmonary effort is normal. No respiratory distress.     Breath sounds: Normal  breath sounds.  Abdominal:     General: There is no distension.     Palpations: Abdomen is soft.     Tenderness: There is abdominal tenderness in the suprapubic area. There is no right CVA tenderness, left CVA tenderness or guarding.  Musculoskeletal:        General: No swelling.     Cervical back: Neck supple.  Skin:    General: Skin is warm and dry.     Capillary Refill: Capillary refill takes less than 2 seconds.  Neurological:     General: No focal deficit present.     Mental Status: She is alert and oriented to person, place, and time.  Psychiatric:        Mood and Affect: Mood normal.     (all labs ordered are  listed, but only abnormal results are displayed) Labs Reviewed  WET PREP, GENITAL - Abnormal; Notable for the following components:      Result Value   Trich, Wet Prep PRESENT (*)    Clue Cells Wet Prep HPF POC PRESENT (*)    WBC, Wet Prep HPF POC >=10 (*)    All other components within normal limits  COMPREHENSIVE METABOLIC PANEL WITH GFR - Abnormal; Notable for the following components:   Alkaline Phosphatase 140 (*)    All other components within normal limits  CBC - Abnormal; Notable for the following components:   RDW 16.0 (*)    All other components within normal limits  URINALYSIS, ROUTINE W REFLEX MICROSCOPIC - Abnormal; Notable for the following components:   Leukocytes,Ua LARGE (*)    Bacteria, UA RARE (*)    All other components within normal limits  URINE CULTURE  LIPASE, BLOOD  GC/CHLAMYDIA PROBE AMP ([place]) NOT AT [facility]    EKG: None  Radiology: CT ABDOMEN PELVIS W CONTRAST Result Date: 11/17/2023 CLINICAL DATA:  Provided history: Diverticulitis, complication suspected Lower abdominal pain for 2 weeks. EXAM: CT ABDOMEN AND PELVIS WITH CONTRAST TECHNIQUE: Multidetector CT imaging of the abdomen and pelvis was performed using the standard protocol following bolus administration of intravenous contrast. RADIATION DOSE REDUCTION: This exam was  performed according to the departmental dose-optimization program which includes automated exposure control, adjustment of the mA and/or kV according to patient size and/or use of iterative reconstruction technique. CONTRAST:  100mL OMNIPAQUE IOHEXOL 300 MG/ML  SOLN COMPARISON:  None Available. FINDINGS: Lower chest: Emphysema. No basilar airspace disease or pleural effusion. Hepatobiliary: 15 mm cyst in the left lobe of the liver, series 2, image 25. Additional tiny hepatic hypodensities are too small to accurately characterize. Gallbladder physiologically distended, no calcified stone. No biliary dilatation. Pancreas: No ductal dilatation or inflammation. Spleen: Normal in size without focal abnormality. Incidental calcified granuloma superiorly. Adrenals/Urinary Tract: No adrenal nodule. No hydronephrosis or perinephric edema. Homogeneous renal enhancement with symmetric excretion on delayed phase imaging. Urinary bladder is minimally distended without wall thickening. Stomach/Bowel: Minimal diverticulosis without diverticulitis. There is no colonic wall thickening or inflammatory change. Moderate volume of stool in the colon. The appendix is normal. No small bowel obstruction or inflammatory change. Unremarkable appearance of the stomach. Vascular/Lymphatic: Aortic atherosclerosis. No aortic aneurysm. Patent portal and splenic veins. No abdominopelvic adenopathy. Reproductive: Status post hysterectomy. No adnexal masses. Other: No free air, free fluid, or intra-abdominal fluid collection. No inguinal or abdominal wall hernia. Musculoskeletal: Degenerative disc disease with endplate irregularity at L1-L2, L2-L3, and L5-S1. There are no acute or suspicious osseous abnormalities. IMPRESSION: 1. No acute abnormality in the abdomen/pelvis. 2. Minimal colonic diverticulosis without diverticulitis. 3. Moderate colonic stool burden, can be seen with constipation. Aortic Atherosclerosis (ICD10-I70.0) and Emphysema  (ICD10-J43.9). Electronically Signed   By: Andrea Gasman M.D.   On: 11/17/2023 18:18     Procedures   Medications Ordered in the ED  metroNIDAZOLE  (FLAGYL ) tablet 500 mg (has no administration in time range)  morphine (PF) 2 MG/ML injection 2 mg (2 mg Intravenous Given 11/17/23 1720)  ondansetron  (ZOFRAN ) injection 4 mg (4 mg Intravenous Given 11/17/23 1720)  iohexol (OMNIPAQUE) 300 MG/ML solution 100 mL (100 mLs Intravenous Contrast Given 11/17/23 1743)                                    Medical Decision  Making Amount and/or Complexity of Data Reviewed Labs: ordered. Radiology: ordered.  Risk Prescription drug management.   This patient presents to the ED for concern of abdominal pain vaginal discharge differential diagnosis includes fistula, diverticulitis, STD, UTI, yeast infection, bacterial vaginosis  Lab Tests:  I Ordered, and personally interpreted labs.  The pertinent results include: Elevated alk phos at 140 which is chronic per historical values, lipase 26, CBC unremarkable, UA with large leukocytes, rare bacteria, and greater than 50 WBCs, positive for trichomoniasis and clue cells Urine culture pending   Imaging Studies ordered:  I ordered imaging studies including CT abdomen pelvis with contrast I independently visualized and interpreted imaging which showed no acute abnormality in the abdomen or pelvis. I agree with the radiologist interpretation   Medicines ordered and prescription drug management:  I ordered medication including morphine and Zofran , metronidazole     I have reviewed the patients home medicines and have made adjustments as needed   Problem List / ED Course:  Considered for admission or further workup however patient's vital signs, physical exam, labs, and imaging are reassuring.  Patient's symptoms likely due to trichomonas and BV.  Patient treated outpatient with metronidazole .  Patient given return precautions.  I feel patient safe  for discharge at this time.       Final diagnoses:  Trichomonas infection  Bacterial vaginosis    ED Discharge Orders          Ordered    metroNIDAZOLE  (FLAGYL ) 500 MG tablet  2 times daily        11/17/23 1837               Francis Ileana SAILOR, PA-C 11/17/23 1848    Tegeler, Lonni PARAS, MD 11/17/23 2113

## 2023-11-17 NOTE — ED Triage Notes (Signed)
 Patient has had lower abdominal pain for 2 weeks now. Denies n/v/d or constipation. Reports vaginal discharge.

## 2023-11-17 NOTE — ED Notes (Signed)
 Reviewed discharge instructions, medications, and home care with pt. Pt verbalized understanding and had no further questions. Pt exited ED without complications.

## 2023-11-18 ENCOUNTER — Ambulatory Visit (HOSPITAL_BASED_OUTPATIENT_CLINIC_OR_DEPARTMENT_OTHER): Admitting: Physical Therapy

## 2023-11-18 LAB — URINE CULTURE: Culture: NO GROWTH

## 2023-11-18 LAB — GC/CHLAMYDIA PROBE AMP (~~LOC~~) NOT AT ARMC
Chlamydia: NEGATIVE
Comment: NEGATIVE
Comment: NORMAL
Neisseria Gonorrhea: NEGATIVE

## 2023-11-21 ENCOUNTER — Other Ambulatory Visit: Payer: Self-pay | Admitting: Pulmonary Disease

## 2023-11-21 ENCOUNTER — Encounter: Payer: Self-pay | Admitting: Radiology

## 2023-11-21 ENCOUNTER — Other Ambulatory Visit: Payer: Self-pay | Admitting: Orthopedic Surgery

## 2023-11-22 ENCOUNTER — Ambulatory Visit (HOSPITAL_BASED_OUTPATIENT_CLINIC_OR_DEPARTMENT_OTHER): Admitting: Physical Therapy

## 2023-11-23 ENCOUNTER — Other Ambulatory Visit: Payer: Self-pay | Admitting: Family Medicine

## 2023-11-23 DIAGNOSIS — Z1231 Encounter for screening mammogram for malignant neoplasm of breast: Secondary | ICD-10-CM

## 2023-11-24 ENCOUNTER — Ambulatory Visit
Admission: RE | Admit: 2023-11-24 | Discharge: 2023-11-24 | Disposition: A | Discharge status: Home / Self Care | Source: Ambulatory Visit | Attending: Family Medicine | Admitting: Family Medicine

## 2023-11-24 DIAGNOSIS — Z1231 Encounter for screening mammogram for malignant neoplasm of breast: Secondary | ICD-10-CM

## 2023-11-25 ENCOUNTER — Ambulatory Visit (HOSPITAL_BASED_OUTPATIENT_CLINIC_OR_DEPARTMENT_OTHER): Attending: Family Medicine | Admitting: Physical Therapy

## 2023-11-29 ENCOUNTER — Ambulatory Visit: Payer: Self-pay | Admitting: Family Medicine

## 2023-11-29 ENCOUNTER — Ambulatory Visit (HOSPITAL_BASED_OUTPATIENT_CLINIC_OR_DEPARTMENT_OTHER): Attending: Family Medicine | Admitting: Physical Therapy

## 2023-12-02 ENCOUNTER — Ambulatory Visit (HOSPITAL_BASED_OUTPATIENT_CLINIC_OR_DEPARTMENT_OTHER): Admitting: Physical Therapy

## 2023-12-09 ENCOUNTER — Encounter: Payer: Self-pay | Admitting: Family Medicine

## 2023-12-09 ENCOUNTER — Other Ambulatory Visit: Payer: Self-pay

## 2023-12-09 DIAGNOSIS — K219 Gastro-esophageal reflux disease without esophagitis: Secondary | ICD-10-CM

## 2023-12-09 MED ORDER — ESOMEPRAZOLE MAGNESIUM 40 MG PO CPDR
40.0000 mg | DELAYED_RELEASE_CAPSULE | Freq: Every day | ORAL | 1 refills | Status: DC
Start: 2023-12-09 — End: 2023-12-09

## 2023-12-09 MED ORDER — ESOMEPRAZOLE MAGNESIUM 40 MG PO CPDR: 40.0000 mg | DELAYED_RELEASE_CAPSULE | Freq: Every day | ORAL | 1 refills | Status: AC

## 2023-12-21 ENCOUNTER — Other Ambulatory Visit: Payer: Self-pay | Admitting: Family Medicine

## 2023-12-21 DIAGNOSIS — N39498 Other specified urinary incontinence: Secondary | ICD-10-CM

## 2024-01-17 ENCOUNTER — Other Ambulatory Visit: Payer: Self-pay | Admitting: Family Medicine

## 2024-02-04 ENCOUNTER — Other Ambulatory Visit: Payer: Self-pay | Admitting: Family Medicine

## 2024-02-05 ENCOUNTER — Other Ambulatory Visit: Payer: Self-pay | Admitting: Pulmonary Disease

## 2024-02-11 ENCOUNTER — Other Ambulatory Visit: Payer: Self-pay | Admitting: Family Medicine

## 2024-02-11 ENCOUNTER — Other Ambulatory Visit: Payer: Self-pay | Admitting: Orthopedic Surgery

## 2024-02-11 DIAGNOSIS — R0982 Postnasal drip: Secondary | ICD-10-CM

## 2024-02-21 ENCOUNTER — Encounter (HOSPITAL_BASED_OUTPATIENT_CLINIC_OR_DEPARTMENT_OTHER): Payer: Self-pay | Admitting: Pulmonary Disease

## 2024-02-21 NOTE — Telephone Encounter (Signed)
 Please advise.

## 2024-03-05 ENCOUNTER — Ambulatory Visit: Admitting: Adult Health
